# Patient Record
Sex: Male | Born: 1960 | Race: White | Hispanic: No | Marital: Married | Smoking: Former smoker
Health system: Southern US, Community
[De-identification: ages and names within clinical notes are randomized; demographics above are authoritative.]

## PROBLEM LIST (undated history)

## (undated) DIAGNOSIS — R945 Abnormal results of liver function studies: Secondary | ICD-10-CM

## (undated) DIAGNOSIS — G5600 Carpal tunnel syndrome, unspecified upper limb: Secondary | ICD-10-CM

## (undated) DIAGNOSIS — E785 Hyperlipidemia, unspecified: Secondary | ICD-10-CM

## (undated) DIAGNOSIS — I480 Paroxysmal atrial fibrillation: Principal | ICD-10-CM

## (undated) DIAGNOSIS — I10 Essential (primary) hypertension: Secondary | ICD-10-CM

## (undated) DIAGNOSIS — N529 Male erectile dysfunction, unspecified: Secondary | ICD-10-CM

## (undated) DIAGNOSIS — Z9289 Personal history of other medical treatment: Secondary | ICD-10-CM

## (undated) DIAGNOSIS — E669 Obesity, unspecified: Secondary | ICD-10-CM

## (undated) DIAGNOSIS — N400 Enlarged prostate without lower urinary tract symptoms: Secondary | ICD-10-CM

## (undated) DIAGNOSIS — E1142 Type 2 diabetes mellitus with diabetic polyneuropathy: Principal | ICD-10-CM

## (undated) DIAGNOSIS — G4710 Hypersomnia, unspecified: Secondary | ICD-10-CM

## (undated) HISTORY — PX: VASECTOMY: SHX75

## (undated) HISTORY — DX: Hypersomnia, unspecified: G47.10

## (undated) HISTORY — DX: Carpal tunnel syndrome, unspecified upper limb: G56.00

## (undated) HISTORY — DX: Obesity, unspecified: E66.9

## (undated) HISTORY — DX: Paroxysmal atrial fibrillation: I48.0

## (undated) HISTORY — DX: Abnormal results of liver function studies: R94.5

## (undated) HISTORY — DX: Benign prostatic hyperplasia without lower urinary tract symptoms: N40.0

## (undated) HISTORY — DX: Hyperlipidemia, unspecified: E78.5

## (undated) HISTORY — DX: Male erectile dysfunction, unspecified: N52.9

## (undated) HISTORY — DX: Type 2 diabetes mellitus with diabetic polyneuropathy: E11.42

## (undated) HISTORY — PX: PROSTATE SURGERY: SHX751

## (undated) HISTORY — DX: Essential (primary) hypertension: I10

## (undated) HISTORY — DX: Personal history of other medical treatment: Z92.89

---

## 2005-01-31 ENCOUNTER — Ambulatory Visit: Payer: Self-pay | Admitting: Cardiology

## 2005-05-31 ENCOUNTER — Ambulatory Visit: Payer: Self-pay | Admitting: Internal Medicine

## 2005-06-21 ENCOUNTER — Encounter: Admission: RE | Admit: 2005-06-21 | Discharge: 2005-06-21 | Payer: Self-pay | Admitting: General Surgery

## 2005-06-23 ENCOUNTER — Ambulatory Visit: Payer: Self-pay | Admitting: Internal Medicine

## 2005-07-21 ENCOUNTER — Ambulatory Visit: Payer: Self-pay | Admitting: Internal Medicine

## 2005-08-11 ENCOUNTER — Ambulatory Visit: Payer: Self-pay | Admitting: Internal Medicine

## 2005-08-30 ENCOUNTER — Ambulatory Visit: Payer: Self-pay | Admitting: Internal Medicine

## 2005-10-16 ENCOUNTER — Encounter: Admission: RE | Admit: 2005-10-16 | Discharge: 2005-11-07 | Payer: Self-pay | Admitting: Internal Medicine

## 2005-11-07 ENCOUNTER — Ambulatory Visit: Payer: Self-pay | Admitting: Internal Medicine

## 2006-02-12 ENCOUNTER — Encounter: Admission: RE | Admit: 2006-02-12 | Discharge: 2006-02-12 | Payer: Self-pay | Admitting: Internal Medicine

## 2006-03-07 ENCOUNTER — Ambulatory Visit: Payer: Self-pay | Admitting: Internal Medicine

## 2006-04-06 ENCOUNTER — Ambulatory Visit: Payer: Self-pay | Admitting: Internal Medicine

## 2006-06-06 ENCOUNTER — Ambulatory Visit: Payer: Self-pay | Admitting: Internal Medicine

## 2006-06-06 LAB — CONVERTED CEMR LAB
ALT: 45 units/L — ABNORMAL HIGH (ref 0–40)
AST: 25 units/L (ref 0–37)
BUN: 14 mg/dL (ref 6–23)
CO2: 28 meq/L (ref 19–32)
Calcium: 9.2 mg/dL (ref 8.4–10.5)
Chloride: 101 meq/L (ref 96–112)
Creatinine, Ser: 1.2 mg/dL (ref 0.4–1.5)
Creatinine,U: 225.5 mg/dL
GFR calc non Af Amer: 69 mL/min
Glomerular Filtration Rate, Af Am: 83 mL/min/{1.73_m2}
Glucose, Bld: 143 mg/dL — ABNORMAL HIGH (ref 70–99)
Hgb A1c MFr Bld: 7 % — ABNORMAL HIGH (ref 4.6–6.0)
Microalb Creat Ratio: 1.8 mg/g (ref 0.0–30.0)
Microalb, Ur: 0.4 mg/dL (ref 0.0–1.9)
Potassium: 3.7 meq/L (ref 3.5–5.1)
Sodium: 138 meq/L (ref 135–145)

## 2006-08-09 ENCOUNTER — Ambulatory Visit: Payer: Self-pay | Admitting: Internal Medicine

## 2007-02-11 ENCOUNTER — Encounter (INDEPENDENT_AMBULATORY_CARE_PROVIDER_SITE_OTHER): Payer: Self-pay | Admitting: *Deleted

## 2007-03-12 ENCOUNTER — Ambulatory Visit: Payer: Self-pay | Admitting: Internal Medicine

## 2007-03-12 DIAGNOSIS — R945 Abnormal results of liver function studies: Secondary | ICD-10-CM

## 2007-03-12 DIAGNOSIS — N529 Male erectile dysfunction, unspecified: Secondary | ICD-10-CM

## 2007-03-12 DIAGNOSIS — N400 Enlarged prostate without lower urinary tract symptoms: Secondary | ICD-10-CM | POA: Insufficient documentation

## 2007-03-12 DIAGNOSIS — J309 Allergic rhinitis, unspecified: Secondary | ICD-10-CM | POA: Insufficient documentation

## 2007-03-12 DIAGNOSIS — E119 Type 2 diabetes mellitus without complications: Secondary | ICD-10-CM | POA: Insufficient documentation

## 2007-03-12 DIAGNOSIS — E118 Type 2 diabetes mellitus with unspecified complications: Secondary | ICD-10-CM | POA: Insufficient documentation

## 2007-03-15 LAB — CONVERTED CEMR LAB
ALT: 46 units/L (ref 0–53)
AST: 23 units/L (ref 0–37)
BUN: 16 mg/dL (ref 6–23)
Basophils Absolute: 0 10*3/uL (ref 0.0–0.1)
Basophils Relative: 0.1 % (ref 0.0–1.0)
CO2: 30 meq/L (ref 19–32)
Calcium: 8.6 mg/dL (ref 8.4–10.5)
Chloride: 108 meq/L (ref 96–112)
Creatinine, Ser: 1 mg/dL (ref 0.4–1.5)
Creatinine,U: 109.6 mg/dL
Eosinophils Absolute: 0.3 10*3/uL (ref 0.0–0.6)
Eosinophils Relative: 4.7 % (ref 0.0–5.0)
GFR calc Af Amer: 103 mL/min
GFR calc non Af Amer: 85 mL/min
Glucose, Bld: 156 mg/dL — ABNORMAL HIGH (ref 70–99)
HCT: 43.6 % (ref 39.0–52.0)
Hemoglobin: 15.3 g/dL (ref 13.0–17.0)
Hgb A1c MFr Bld: 6.9 % — ABNORMAL HIGH (ref 4.6–6.0)
Lymphocytes Relative: 29.1 % (ref 12.0–46.0)
MCHC: 35 g/dL (ref 30.0–36.0)
MCV: 82.7 fL (ref 78.0–100.0)
Microalb Creat Ratio: 2.7 mg/g (ref 0.0–30.0)
Microalb, Ur: 0.3 mg/dL (ref 0.0–1.9)
Monocytes Absolute: 0.6 10*3/uL (ref 0.2–0.7)
Monocytes Relative: 10 % (ref 3.0–11.0)
Neutro Abs: 3.4 10*3/uL (ref 1.4–7.7)
Neutrophils Relative %: 56.1 % (ref 43.0–77.0)
Platelets: 266 10*3/uL (ref 150–400)
Potassium: 4.5 meq/L (ref 3.5–5.1)
RBC: 5.27 M/uL (ref 4.22–5.81)
RDW: 13.1 % (ref 11.5–14.6)
Sodium: 143 meq/L (ref 135–145)
WBC: 6 10*3/uL (ref 4.5–10.5)

## 2007-05-16 ENCOUNTER — Telehealth (INDEPENDENT_AMBULATORY_CARE_PROVIDER_SITE_OTHER): Payer: Self-pay | Admitting: *Deleted

## 2007-05-17 ENCOUNTER — Ambulatory Visit: Payer: Self-pay | Admitting: Internal Medicine

## 2007-07-15 ENCOUNTER — Encounter (INDEPENDENT_AMBULATORY_CARE_PROVIDER_SITE_OTHER): Payer: Self-pay | Admitting: *Deleted

## 2007-07-22 ENCOUNTER — Encounter (INDEPENDENT_AMBULATORY_CARE_PROVIDER_SITE_OTHER): Payer: Self-pay | Admitting: *Deleted

## 2007-08-06 ENCOUNTER — Ambulatory Visit: Payer: Self-pay | Admitting: Internal Medicine

## 2007-08-09 ENCOUNTER — Encounter (INDEPENDENT_AMBULATORY_CARE_PROVIDER_SITE_OTHER): Payer: Self-pay | Admitting: *Deleted

## 2007-08-09 LAB — CONVERTED CEMR LAB
Cholesterol: 157 mg/dL (ref 0–200)
Creatinine,U: 162.3 mg/dL
HDL: 30 mg/dL — ABNORMAL LOW (ref >39.0)
Hgb A1c MFr Bld: 7.6 % — ABNORMAL HIGH (ref 4.6–6.0)
LDL Cholesterol: 109 mg/dL — ABNORMAL HIGH (ref 0–99)
Microalb Creat Ratio: 10.5 mg/g (ref 0.0–30.0)
Microalb, Ur: 1.7 mg/dL (ref 0.0–1.9)
TSH: 0.97 microintl units/mL (ref 0.35–5.50)
Total CHOL/HDL Ratio: 5.2
Triglycerides: 91 mg/dL (ref 0–149)
VLDL: 18 mg/dL (ref 0–40)

## 2007-09-05 ENCOUNTER — Ambulatory Visit: Payer: Self-pay | Admitting: Internal Medicine

## 2007-12-11 ENCOUNTER — Ambulatory Visit: Payer: Self-pay | Admitting: Internal Medicine

## 2007-12-11 DIAGNOSIS — G4710 Hypersomnia, unspecified: Secondary | ICD-10-CM | POA: Insufficient documentation

## 2007-12-11 HISTORY — DX: Hypersomnia, unspecified: G47.10

## 2007-12-11 LAB — CONVERTED CEMR LAB: Glucose, Bld: 273 mg/dL

## 2007-12-16 LAB — CONVERTED CEMR LAB
BUN: 13 mg/dL (ref 6–23)
CO2: 29 meq/L (ref 19–32)
Chloride: 101 meq/L (ref 96–112)
Creatinine, Ser: 1.2 mg/dL (ref 0.4–1.5)
Hgb A1c MFr Bld: 8.7 % — ABNORMAL HIGH (ref 4.6–6.0)
Potassium: 4.6 meq/L (ref 3.5–5.1)

## 2007-12-25 ENCOUNTER — Telehealth: Payer: Self-pay | Admitting: Internal Medicine

## 2008-02-25 ENCOUNTER — Ambulatory Visit: Payer: Self-pay | Admitting: Internal Medicine

## 2008-02-28 ENCOUNTER — Encounter (INDEPENDENT_AMBULATORY_CARE_PROVIDER_SITE_OTHER): Payer: Self-pay | Admitting: *Deleted

## 2008-02-28 LAB — CONVERTED CEMR LAB
Alkaline Phosphatase: 84 units/L (ref 39–117)
Bilirubin, Direct: 0.1 mg/dL (ref 0.0–0.3)
Creatinine, Ser: 1 mg/dL (ref 0.4–1.5)
Total Bilirubin: 0.6 mg/dL (ref 0.3–1.2)
Total Protein: 7.2 g/dL (ref 6.0–8.3)

## 2008-03-18 ENCOUNTER — Ambulatory Visit: Payer: Self-pay | Admitting: Internal Medicine

## 2008-03-18 LAB — CONVERTED CEMR LAB
Bilirubin Urine: NEGATIVE
Blood in Urine, dipstick: NEGATIVE
Ketones, urine, test strip: NEGATIVE
Nitrite: NEGATIVE
RBC / HPF: NONE SEEN (ref <3)
pH: 8

## 2008-03-19 ENCOUNTER — Encounter: Payer: Self-pay | Admitting: Internal Medicine

## 2008-03-23 ENCOUNTER — Ambulatory Visit: Payer: Self-pay | Admitting: Cardiovascular Disease

## 2008-03-25 ENCOUNTER — Telehealth (INDEPENDENT_AMBULATORY_CARE_PROVIDER_SITE_OTHER): Payer: Self-pay | Admitting: *Deleted

## 2008-04-26 HISTORY — PX: CYSTOSCOPY: SUR368

## 2008-05-11 ENCOUNTER — Ambulatory Visit: Payer: Self-pay | Admitting: Family Medicine

## 2008-05-11 LAB — CONVERTED CEMR LAB
Blood in Urine, dipstick: NEGATIVE
Glucose, Urine, Semiquant: >=1000
Nitrite: NEGATIVE
Specific Gravity, Urine: 1.015
WBC Urine, dipstick: NEGATIVE
WBC, UA: NONE SEEN cells/hpf (ref <3)
pH: 6

## 2008-05-12 ENCOUNTER — Encounter: Payer: Self-pay | Admitting: Family Medicine

## 2008-05-13 ENCOUNTER — Encounter: Payer: Self-pay | Admitting: Internal Medicine

## 2008-05-14 ENCOUNTER — Encounter (INDEPENDENT_AMBULATORY_CARE_PROVIDER_SITE_OTHER): Payer: Self-pay | Admitting: *Deleted

## 2008-06-23 ENCOUNTER — Encounter (INDEPENDENT_AMBULATORY_CARE_PROVIDER_SITE_OTHER): Payer: Self-pay | Admitting: *Deleted

## 2008-06-26 DIAGNOSIS — R7989 Other specified abnormal findings of blood chemistry: Secondary | ICD-10-CM

## 2008-06-26 HISTORY — DX: Other specified abnormal findings of blood chemistry: R79.89

## 2008-07-22 ENCOUNTER — Ambulatory Visit: Payer: Self-pay | Admitting: Internal Medicine

## 2008-07-27 ENCOUNTER — Telehealth (INDEPENDENT_AMBULATORY_CARE_PROVIDER_SITE_OTHER): Payer: Self-pay | Admitting: *Deleted

## 2008-07-27 LAB — CONVERTED CEMR LAB
ALT: 63 units/L — ABNORMAL HIGH (ref 0–53)
AST: 31 units/L (ref 0–37)
Hgb A1c MFr Bld: 7.3 % — ABNORMAL HIGH (ref 4.6–6.0)
Total Protein: 7.1 g/dL (ref 6.0–8.3)

## 2008-10-22 ENCOUNTER — Telehealth (INDEPENDENT_AMBULATORY_CARE_PROVIDER_SITE_OTHER): Payer: Self-pay | Admitting: *Deleted

## 2008-11-04 ENCOUNTER — Encounter (INDEPENDENT_AMBULATORY_CARE_PROVIDER_SITE_OTHER): Payer: Self-pay | Admitting: *Deleted

## 2008-11-04 ENCOUNTER — Ambulatory Visit: Payer: Self-pay | Admitting: Internal Medicine

## 2008-11-04 LAB — CONVERTED CEMR LAB
Blood in Urine, dipstick: NEGATIVE
Glucose, Urine, Semiquant: NEGATIVE
Nitrite: NEGATIVE
Specific Gravity, Urine: <1.005
WBC Urine, dipstick: NEGATIVE
pH: 6

## 2008-11-10 LAB — CONVERTED CEMR LAB
BUN: 17 mg/dL (ref 6–23)
Basophils Absolute: 0 10*3/uL (ref 0.0–0.1)
CO2: 30 meq/L (ref 19–32)
Creatinine,U: 34 mg/dL
Eosinophils Absolute: 0.2 10*3/uL (ref 0.0–0.7)
Glucose, Bld: 117 mg/dL — ABNORMAL HIGH (ref 70–99)
HCT: 46.5 % (ref 39.0–52.0)
HDL: 28.1 mg/dL — ABNORMAL LOW (ref >39.00)
Hemoglobin: 16 g/dL (ref 13.0–17.0)
Lymphs Abs: 1.4 10*3/uL (ref 0.7–4.0)
MCHC: 34.4 g/dL (ref 30.0–36.0)
Microalb Creat Ratio: 5.9 mg/g (ref 0.0–30.0)
Monocytes Absolute: 0.5 10*3/uL (ref 0.1–1.0)
Neutro Abs: 3.5 10*3/uL (ref 1.4–7.7)
PSA: 0.49 ng/mL (ref 0.10–4.00)
Platelets: 229 10*3/uL (ref 150.0–400.0)
Potassium: 4.3 meq/L (ref 3.5–5.1)
RDW: 12.5 % (ref 11.5–14.6)
Sodium: 140 meq/L (ref 135–145)
Total CHOL/HDL Ratio: 5
Triglycerides: 70 mg/dL (ref 0.0–149.0)
VLDL: 14 mg/dL (ref 0.0–40.0)

## 2009-01-19 ENCOUNTER — Ambulatory Visit: Payer: Self-pay | Admitting: Internal Medicine

## 2009-01-19 ENCOUNTER — Encounter (INDEPENDENT_AMBULATORY_CARE_PROVIDER_SITE_OTHER): Payer: Self-pay | Admitting: *Deleted

## 2009-01-19 LAB — CONVERTED CEMR LAB
Glucose, Urine, Semiquant: 500
Nitrite: NEGATIVE
Specific Gravity, Urine: 1.01
WBC Urine, dipstick: NEGATIVE
pH: 5

## 2009-01-20 ENCOUNTER — Encounter: Payer: Self-pay | Admitting: Internal Medicine

## 2009-01-27 ENCOUNTER — Encounter: Payer: Self-pay | Admitting: Internal Medicine

## 2009-03-08 ENCOUNTER — Encounter (INDEPENDENT_AMBULATORY_CARE_PROVIDER_SITE_OTHER): Payer: Self-pay | Admitting: *Deleted

## 2009-03-16 ENCOUNTER — Encounter (INDEPENDENT_AMBULATORY_CARE_PROVIDER_SITE_OTHER): Payer: Self-pay | Admitting: *Deleted

## 2009-03-16 ENCOUNTER — Ambulatory Visit: Payer: Self-pay | Admitting: Internal Medicine

## 2009-03-22 ENCOUNTER — Ambulatory Visit: Payer: Self-pay | Admitting: Internal Medicine

## 2009-03-24 ENCOUNTER — Telehealth (INDEPENDENT_AMBULATORY_CARE_PROVIDER_SITE_OTHER): Payer: Self-pay | Admitting: *Deleted

## 2009-03-24 LAB — CONVERTED CEMR LAB
Cholesterol: 158 mg/dL (ref 0–200)
LDL Cholesterol: 112 mg/dL — ABNORMAL HIGH (ref 0–99)
Total CHOL/HDL Ratio: 6
Triglycerides: 97 mg/dL (ref 0.0–149.0)
VLDL: 19.4 mg/dL (ref 0.0–40.0)

## 2009-04-03 LAB — CONVERTED CEMR LAB
Hep B E Ab: NEGATIVE
Hep B S Ab: POSITIVE — AB

## 2009-04-30 ENCOUNTER — Encounter: Payer: Self-pay | Admitting: Internal Medicine

## 2009-05-25 ENCOUNTER — Ambulatory Visit: Payer: Self-pay | Admitting: Internal Medicine

## 2009-05-26 ENCOUNTER — Encounter (INDEPENDENT_AMBULATORY_CARE_PROVIDER_SITE_OTHER): Payer: Self-pay | Admitting: *Deleted

## 2009-05-26 LAB — CONVERTED CEMR LAB
AST: 38 units/L — ABNORMAL HIGH (ref 0–37)
Cholesterol: 117 mg/dL (ref 0–200)
HDL: 27.4 mg/dL — ABNORMAL LOW (ref >39.00)
LDL Cholesterol: 72 mg/dL (ref 0–99)
Triglycerides: 88 mg/dL (ref 0.0–149.0)
VLDL: 17.6 mg/dL (ref 0.0–40.0)

## 2009-06-17 ENCOUNTER — Encounter (INDEPENDENT_AMBULATORY_CARE_PROVIDER_SITE_OTHER): Payer: Self-pay | Admitting: *Deleted

## 2009-06-17 ENCOUNTER — Ambulatory Visit: Payer: Self-pay | Admitting: Endocrinology

## 2009-06-17 DIAGNOSIS — G5600 Carpal tunnel syndrome, unspecified upper limb: Secondary | ICD-10-CM

## 2009-06-17 HISTORY — DX: Carpal tunnel syndrome, unspecified upper limb: G56.00

## 2009-07-16 ENCOUNTER — Ambulatory Visit: Payer: Self-pay | Admitting: Internal Medicine

## 2009-07-16 ENCOUNTER — Encounter (INDEPENDENT_AMBULATORY_CARE_PROVIDER_SITE_OTHER): Payer: Self-pay | Admitting: *Deleted

## 2009-07-21 LAB — CONVERTED CEMR LAB
ALT: 53 units/L (ref 0–53)
AST: 25 units/L (ref 0–37)
BUN: 17 mg/dL (ref 6–23)
Bilirubin, Direct: <0.1 mg/dL (ref 0.0–0.3)
Ceruloplasmin: 27 mg/dL (ref 21–63)
Chloride: 104 meq/L (ref 96–112)
Potassium: 4.1 meq/L (ref 3.5–5.3)
Saturation Ratios: 19 % — ABNORMAL LOW (ref 20–55)
Sodium: 134 meq/L — ABNORMAL LOW (ref 135–145)
TIBC: 332 ug/dL (ref 215–435)
Total Bilirubin: 0.3 mg/dL (ref 0.3–1.2)
UIBC: 269 ug/dL

## 2009-07-23 ENCOUNTER — Telehealth: Payer: Self-pay | Admitting: Endocrinology

## 2009-08-11 ENCOUNTER — Encounter (INDEPENDENT_AMBULATORY_CARE_PROVIDER_SITE_OTHER): Payer: Self-pay | Admitting: *Deleted

## 2009-08-11 ENCOUNTER — Ambulatory Visit: Payer: Self-pay | Admitting: Internal Medicine

## 2009-08-11 DIAGNOSIS — R350 Frequency of micturition: Secondary | ICD-10-CM

## 2009-08-11 LAB — CONVERTED CEMR LAB
Bacteria, UA: NONE SEEN
Bilirubin Urine: NEGATIVE
Blood Glucose, Fingerstick: 229
Casts: NONE SEEN /lpf
Crystals: NONE SEEN
Glucose, Urine, Semiquant: >=1000
Ketones, urine, test strip: NEGATIVE
Nitrite: NEGATIVE
Protein, U semiquant: NEGATIVE
Specific Gravity, Urine: 1.015
Urobilinogen, UA: 0.2
WBC Urine, dipstick: NEGATIVE
pH: 5

## 2009-08-12 ENCOUNTER — Telehealth: Payer: Self-pay | Admitting: Endocrinology

## 2009-08-12 ENCOUNTER — Encounter: Payer: Self-pay | Admitting: Internal Medicine

## 2009-08-12 LAB — CONVERTED CEMR LAB: PSA: 0.55 ng/mL (ref 0.10–4.00)

## 2009-08-13 ENCOUNTER — Ambulatory Visit: Payer: Self-pay | Admitting: Endocrinology

## 2009-08-27 ENCOUNTER — Ambulatory Visit: Payer: Self-pay | Admitting: Endocrinology

## 2009-10-11 ENCOUNTER — Encounter: Payer: Self-pay | Admitting: Internal Medicine

## 2009-11-05 ENCOUNTER — Ambulatory Visit: Payer: Self-pay | Admitting: Internal Medicine

## 2009-11-05 DIAGNOSIS — R109 Unspecified abdominal pain: Secondary | ICD-10-CM | POA: Insufficient documentation

## 2009-11-05 LAB — CONVERTED CEMR LAB
Blood in Urine, dipstick: NEGATIVE
Ketones, urine, test strip: NEGATIVE
Nitrite: NEGATIVE
Urobilinogen, UA: 0.2
WBC Urine, dipstick: NEGATIVE

## 2009-11-09 ENCOUNTER — Encounter: Payer: Self-pay | Admitting: Internal Medicine

## 2009-11-09 LAB — CONVERTED CEMR LAB
RBC / HPF: NONE SEEN (ref <3)
WBC, UA: NONE SEEN cells/hpf (ref <3)

## 2009-12-10 ENCOUNTER — Encounter: Payer: Self-pay | Admitting: Endocrinology

## 2009-12-11 ENCOUNTER — Encounter: Payer: Self-pay | Admitting: Internal Medicine

## 2009-12-11 ENCOUNTER — Encounter: Payer: Self-pay | Admitting: Endocrinology

## 2009-12-12 ENCOUNTER — Encounter: Payer: Self-pay | Admitting: Internal Medicine

## 2009-12-12 LAB — CONVERTED CEMR LAB
Cholesterol: 113 mg/dL
LDL Cholesterol: 33.8 mg/dL
T3 Uptake Ratio: 34 %
T4, Total: 7.6 ug/dL
VLDL: 53.4 mg/dL

## 2009-12-13 ENCOUNTER — Ambulatory Visit: Payer: Self-pay | Admitting: Endocrinology

## 2009-12-14 ENCOUNTER — Ambulatory Visit: Payer: Self-pay | Admitting: Internal Medicine

## 2009-12-14 ENCOUNTER — Encounter (INDEPENDENT_AMBULATORY_CARE_PROVIDER_SITE_OTHER): Payer: Self-pay | Admitting: *Deleted

## 2009-12-14 DIAGNOSIS — E785 Hyperlipidemia, unspecified: Secondary | ICD-10-CM

## 2009-12-16 ENCOUNTER — Encounter: Payer: Self-pay | Admitting: Internal Medicine

## 2009-12-21 ENCOUNTER — Ambulatory Visit: Payer: Self-pay | Admitting: Cardiovascular Disease

## 2009-12-21 ENCOUNTER — Encounter: Payer: Self-pay | Admitting: Internal Medicine

## 2009-12-21 DIAGNOSIS — I480 Paroxysmal atrial fibrillation: Secondary | ICD-10-CM | POA: Insufficient documentation

## 2009-12-21 HISTORY — DX: Paroxysmal atrial fibrillation: I48.0

## 2009-12-22 ENCOUNTER — Telehealth: Payer: Self-pay | Admitting: Cardiovascular Disease

## 2010-01-03 ENCOUNTER — Telehealth: Payer: Self-pay | Admitting: Cardiovascular Disease

## 2010-01-04 ENCOUNTER — Ambulatory Visit: Payer: Self-pay | Admitting: Cardiovascular Disease

## 2010-01-13 ENCOUNTER — Ambulatory Visit: Payer: Self-pay | Admitting: Endocrinology

## 2010-01-17 ENCOUNTER — Telehealth: Payer: Self-pay | Admitting: Cardiovascular Disease

## 2010-01-24 DIAGNOSIS — Z9289 Personal history of other medical treatment: Secondary | ICD-10-CM

## 2010-01-24 HISTORY — DX: Personal history of other medical treatment: Z92.89

## 2010-02-01 ENCOUNTER — Ambulatory Visit: Payer: Self-pay | Admitting: Cardiovascular Disease

## 2010-02-03 ENCOUNTER — Encounter: Payer: Self-pay | Admitting: Cardiology

## 2010-02-03 ENCOUNTER — Telehealth (INDEPENDENT_AMBULATORY_CARE_PROVIDER_SITE_OTHER): Payer: Self-pay | Admitting: *Deleted

## 2010-02-07 ENCOUNTER — Encounter (INDEPENDENT_AMBULATORY_CARE_PROVIDER_SITE_OTHER): Payer: Self-pay | Admitting: *Deleted

## 2010-02-07 ENCOUNTER — Encounter (HOSPITAL_COMMUNITY): Admission: RE | Admit: 2010-02-07 | Discharge: 2010-03-15 | Payer: Self-pay | Admitting: Cardiovascular Disease

## 2010-02-07 ENCOUNTER — Ambulatory Visit: Payer: Self-pay

## 2010-02-07 ENCOUNTER — Ambulatory Visit: Payer: Self-pay | Admitting: Cardiology

## 2010-02-21 ENCOUNTER — Ambulatory Visit: Payer: Self-pay | Admitting: Endocrinology

## 2010-02-22 ENCOUNTER — Telehealth: Payer: Self-pay | Admitting: Cardiovascular Disease

## 2010-03-25 ENCOUNTER — Ambulatory Visit: Payer: Self-pay | Admitting: Endocrinology

## 2010-03-25 LAB — CONVERTED CEMR LAB
Creatinine,U: 220.1 mg/dL
Hgb A1c MFr Bld: 7.5 % — ABNORMAL HIGH (ref 4.6–6.5)
Microalb, Ur: 0.3 mg/dL (ref 0.0–1.9)

## 2010-04-12 ENCOUNTER — Encounter: Payer: Self-pay | Admitting: Internal Medicine

## 2010-04-12 ENCOUNTER — Encounter (INDEPENDENT_AMBULATORY_CARE_PROVIDER_SITE_OTHER): Payer: Self-pay | Admitting: *Deleted

## 2010-04-12 ENCOUNTER — Ambulatory Visit: Payer: Self-pay | Admitting: Internal Medicine

## 2010-04-12 DIAGNOSIS — G4700 Insomnia, unspecified: Secondary | ICD-10-CM

## 2010-04-12 LAB — CONVERTED CEMR LAB
ALT: 37 units/L (ref 0–53)
AST: 24 units/L (ref 0–37)
Basophils Absolute: 0.1 10*3/uL (ref 0.0–0.1)
Calcium: 9.2 mg/dL (ref 8.4–10.5)
Chloride: 105 meq/L (ref 96–112)
Creatinine, Ser: 1.1 mg/dL (ref 0.4–1.5)
Eosinophils Absolute: 0.3 10*3/uL (ref 0.0–0.7)
Hemoglobin: 16 g/dL (ref 13.0–17.0)
Lymphocytes Relative: 37.2 % (ref 12.0–46.0)
MCHC: 33.6 g/dL (ref 30.0–36.0)
Neutro Abs: 3.2 10*3/uL (ref 1.4–7.7)
Platelets: 213 10*3/uL (ref 150.0–400.0)
RDW: 13.5 % (ref 11.5–14.6)

## 2010-04-14 ENCOUNTER — Telehealth: Payer: Self-pay | Admitting: Internal Medicine

## 2010-04-19 ENCOUNTER — Ambulatory Visit: Payer: Self-pay | Admitting: Cardiovascular Disease

## 2010-04-22 ENCOUNTER — Ambulatory Visit: Payer: Self-pay | Admitting: Endocrinology

## 2010-07-07 ENCOUNTER — Ambulatory Visit
Admission: RE | Admit: 2010-07-07 | Discharge: 2010-07-07 | Payer: Self-pay | Source: Home / Self Care | Attending: Endocrinology | Admitting: Endocrinology

## 2010-07-07 ENCOUNTER — Other Ambulatory Visit: Payer: Self-pay | Admitting: Endocrinology

## 2010-07-07 LAB — HEMOGLOBIN A1C: Hgb A1c MFr Bld: 7.2 % — ABNORMAL HIGH (ref 4.6–6.5)

## 2010-07-24 LAB — CONVERTED CEMR LAB
Sed Rate: 10 mm/hr (ref 0–20)
Total CK: 147 units/L (ref 7–195)

## 2010-07-26 NOTE — Letter (Signed)
Summary: Out of Work  [facility]  [address] [place], [place] [address]   Phone: [phone]  Fax: [phone]    August 27, 2009   Employee:  Danny [name]    To Whom It May Concern:   For Medical reasons, please excuse the above named employee from work for the following dates:  Afternoon of 08/27/09   Sincerely,    [name] MD

## 2010-07-26 NOTE — Assessment & Plan Note (Signed)
Summary: PER PT 1 MTH FU--STC   Vital Signs:  Patient profile:   50 year old male Height:      71 inches (180.34 cm) Weight:      232.50 pounds (105.68 kg) BMI:     32.54 O2 Sat:      96 % on Room air Temp:     97.4 degrees F (36.33 degrees C) oral Pulse rate:   53 / minute BP sitting:   112 / 64  (left arm) Cuff size:   large  Vitals Entered By: Brenton Grills CMA Duncan Dull) (April 22, 2010 4:17 PM)  O2 Flow:  Room air CC: 1 month F/U/aj Is Patient Diabetic? Yes   Referring Provider:  paz  CC:  1 month F/U/aj.  History of Present Illness: pt says he is still having mild hypoglycemia, with exercise.  he says this is due to the fact that he often cannot anticipate the exercise in order to reduce previos dose of humalog.  no cbg record, but states cbg's are usually highest at hs (high-100's), and lowest before lunch.  Current Medications (verified): 1)  Hydrochlorothiazide 25 Mg Tabs (Hydrochlorothiazide) .... 1/2 Once Daily 2)  Flonase 50 Mcg/act Susp (Fluticasone Propionate) .... As Needed 3)  Onetouch Ultra Test  Strp (Glucose Blood) .... One Time Daily 4)  Flomax 0.4 Mg Xr24h-Cap (Tamsulosin Hcl) .Marland Kitchen.. 1 By Mouth Once Daily 5)  Simvastatin 20 Mg Tabs (Simvastatin) .... Take One Tablet At Bedtime 6)  One Touch Delica Lancets  Misc (Lancets) .... Checks Blood Sugar Daily, Dx 250.00 7)  Fish Oil 1000 Mg Caps (Omega-3 Fatty Acids) .... 4000mg  Two Times A Day 8)  Pradaxa 150 Mg Caps (Dabigatran Etexilate Mesylate) .Marland Kitchen.. 1 Two Times A Day 9)  Lantus Solostar 100 Unit/ml Soln (Insulin Glargine) .... 20 Units At Bedtime 10)  Humalog Kwikpen 100 Unit/ml Soln (Insulin Lispro (Human)) .... 20 Units Three Times A Day (Just Before Each Meal) 11)  Bd Pen Needle Short U/f 31g X 8 Mm Misc (Insulin Pen Needle) .... 4x A Day 12)  Multaq 400 Mg Tabs (Dronedarone Hcl) .... Take One Tablet By Mouth Two Times A Day 13)  Ativan 0.5 Mg Tabs (Lorazepam) .... 1/2 To 1 By Mouth At Bedtime As Needed  Insomnia  Allergies (verified): 1)  ! Metformin Hcl (Metformin Hcl)  Past History:  Past Medical History: Last updated: 04/12/2010 Atrial Fibrilation, paroxysmal---dx 11-2009 8-11----neg stres test Allergic rhinitis Diabetes mellitus, type II, dx 1999 Hypertension Benign prostatic hypertrophy, s/p TUNA procedure aprox 2006 neg cystoscopy @ urology 11-09, was Rx Flomax  ED after TUNA elevated LFTs: (-) chronic hep  panel 2010  Review of Systems  The patient denies syncope.    Physical Exam  General:  normal appearance.   Neck:  Supple without thyroid enlargement or tenderness.    Impression & Recommendations:  Problem # 1:  DIABETES MELLITUS, TYPE II, UNCONTROLLED (ICD-250.02) needs increased rx  Medications Added to Medication List This Visit: 1)  Lantus Solostar 100 Unit/ml Soln (Insulin glargine) .Marland Kitchen.. 15 units at bedtime 2)  Humalog Kwikpen 100 Unit/ml Soln (Insulin lispro (human)) .... Three times a day (just before each meal), 20-20-25 units  Other Orders: Est. Patient Level III ([address])  Patient Instructions: 1)  reduce lantus to 15 units at bedtime 2)  increase humalog to (just before each meal) 20-20-25 units 3)   if exercise is anticipated, take only 10 units of humalog.  however, if you are not able to anticipate the  exercise, eat a light snack with the exercise.   4)  Please schedule a follow-up appointment in 3 months. Prescriptions: HUMALOG KWIKPEN 100 UNIT/ML SOLN (INSULIN LISPRO (HUMAN)) three times a day (just before each meal), 20-20-25 units  #1 box x 11   Entered and Authorized by:   [name] MD   Signed by:   [name] MD on 04/22/2010   Method used:   Print then Give to Patient   RxID:   [id number]    Orders Added: 1)  Est. Patient Level III [[address]782]

## 2010-07-26 NOTE — Assessment & Plan Note (Signed)
Summary: 2 WK FU---STC   Vital Signs:  Patient profile:   50 year old male Height:      71 inches (180.34 cm) Weight:      224.13 pounds (101.88 kg) O2 Sat:      97 % on Room air Temp:     97.1 degrees F (36.17 degrees C) oral Pulse rate:   61 / minute BP sitting:   120 / 70  (left arm) Cuff size:   large  Vitals Entered By: Josph Macho RMA (August 27, 2009 4:03 PM)  O2 Flow:  Room air CC: 2 week follow up/ CF Is Patient Diabetic? Yes   Referring Provider:  paz  CC:  2 week follow up/ CF.  History of Present Illness: edema is much better. he brings a record of his cbg's which i have reviewed today.  it varies from 120-300, with most in high-100's.  there is no trend throughout the day.  he is not taking the parlodel.  Current Medications (verified): 1)  Januvia 100 Mg Tabs (Sitagliptin Phosphate) .... Take One Tab By Mouth Daily 2)  Hydrochlorothiazide 25 Mg Tabs (Hydrochlorothiazide) .Marland Kitchen.. 1 By Mouth Qd 3)  Flonase 50 Mcg/act Susp (Fluticasone Propionate) .... As Needed 4)  Aspirin 81 Mg  Tbec (Aspirin) .... Once Daily 5)  Onetouch Ultra Test  Strp (Glucose Blood) .... One Time Daily 6)  Flomax 0.4 Mg Xr24h-Cap (Tamsulosin Hcl) .Marland Kitchen.. 1 By Mouth Once Daily 7)  Simvastatin 20 Mg Tabs (Simvastatin) .... Take One Tablet At Bedtime 8)  One Touch Delica Lancets  Misc (Lancets) .... Checks Blood Sugar Daily, Dx 250.00 9)  One Touch Test Strips .... Checks Blood Sugar Daily, Dx 250.00 10)  Ciprofloxacin Hcl 500 Mg Tabs (Ciprofloxacin Hcl) .... One  By Mouth Twice A Day 11)  Glimepiride 4 Mg Tabs (Glimepiride) .Marland Kitchen.. 1 Bid 12)  Bromocriptine Mesylate 2.5 Mg Tabs (Bromocriptine Mesylate) .... 1/2 Tab Qhs  Allergies (verified): 1)  ! Metformin Hcl (Metformin Hcl)  Past History:  Past Medical History: Last updated: 07/16/2009 Allergic rhinitis Diabetes mellitus, type II, diagnosis in 1999 Hypertension Benign prostatic hypertrophy, s/p TUNA procedure aprox 2006 neg cystoscopy  @ urology 11-09, was Rx Flomax  ED after TUNA elevated LFTs: (-) chronic hep  panel 2010  Review of Systems  The patient denies hypoglycemia.    Physical Exam  General:  normal appearance.   Extremities:  trace right pedal edema and trace left pedal edema.   Additional Exam:   Hemoglobin A1C       [H]  8.8 %    Impression & Recommendations:  Problem # 1:  DIABETES MELLITUS, TYPE II, UNCONTROLLED (ICD-250.02) needs increased rx  Other Orders: TLB-A1C / Hgb A1C (Glycohemoglobin) (83036-A1C) Est. Patient Level III ([address])  Patient Instructions: 1)  i have resent the bromocriptine to [facility] on [address].  while this does not cause the sugar to be low, it can expose the tendency for the glimepiride to put it low.  therefore, if it goes low, reduce the glimepiride by 1/2. 2)  tests are being ordered for you today.  a few days after the test(s), please call [phone] to hear your test results. 3)  Please schedule a follow-up appointment in 3 months. Prescriptions: BROMOCRIPTINE MESYLATE 2.5 MG TABS (BROMOCRIPTINE MESYLATE) 1/2 tab qhs  #30 x 5   Entered and Authorized by:   [name] MD   Signed by:   [name] MD on 08/27/2009  Method used:   Electronically to        [facility] (retail)       [address]       [place], [place]  [address]       Ph: [phone] or [phone]       Fax: [phone]   RxID:   [id number]

## 2010-07-26 NOTE — Progress Notes (Signed)
  Phone Note Call from Patient   Caller: girlfriend Summary of Call: T.C. from patient's girl friend-patient went back into a.fib last night-taken to ED and was given Cardizem which slowed down rate.  Still in a. fib.  ED MD advised patient to stop Lopressor and start on Cardizem CD 180mg .  Patient to leave today around 1pm for beach.  Wants to know if should change medications and if can go to beach as planned.  Will contact Dr. Freida Busman and let patient know.   Initial call taken by: Dessie Coma,  December 22, 2009 9:01 AM

## 2010-07-26 NOTE — Assessment & Plan Note (Signed)
Summary: PT BLOOD SUGAR ELEVATED, NEED TO BE SEEN SOONER PER PAZ AND E...   Vital Signs:  Patient profile:   50 year old male Height:      71 inches (180.34 cm) Weight:      231.25 pounds (105.11 kg) O2 Sat:      97 % on Room air Temp:     97.5 degrees F (36.39 degrees C) oral Pulse rate:   73 / minute BP sitting:   122 / 64  (left arm) Cuff size:   large  Vitals Entered By: Josph Macho RMA (August 13, 2009 2:41 PM)  O2 Flow:  Room air CC: BS elevated/ 245 this morning/ CF Is Patient Diabetic? Yes   Referring Provider:  paz  CC:  BS elevated/ 245 this morning/ CF.  History of Present Illness: pt says his cbg's have increased to the 200's.  he is not sure why, except for a recent prostate infection.   pt states few weeks of moderate edema of the legs, but no associated sob.  Current Medications (verified): 1)  Januvia 100 Mg Tabs (Sitagliptin Phosphate) .... Take One Tab By Mouth Daily 2)  Hydrochlorothiazide 25 Mg Tabs (Hydrochlorothiazide) .Marland Kitchen.. 1 By Mouth Qd 3)  Flonase 50 Mcg/act Susp (Fluticasone Propionate) .... As Needed 4)  Aspirin 81 Mg  Tbec (Aspirin) .... Once Daily 5)  Onetouch Ultra Test  Strp (Glucose Blood) .... One Time Daily 6)  Flomax 0.4 Mg Xr24h-Cap (Tamsulosin Hcl) .Marland Kitchen.. 1 By Mouth Once Daily 7)  Simvastatin 20 Mg Tabs (Simvastatin) .... Take One Tablet At Bedtime 8)  Actos 45 Mg Tabs (Pioglitazone Hcl) .Marland Kitchen.. 1 Qd 9)  Glimepiride 1 Mg Tabs (Glimepiride) .Marland Kitchen.. 1 Qam 10)  One Touch Delica Lancets  Misc (Lancets) .... Checks Blood Sugar Daily, Dx 250.00 11)  One Touch Test Strips .... Checks Blood Sugar Daily, Dx 250.00 12)  Ciprofloxacin Hcl 500 Mg Tabs (Ciprofloxacin Hcl) .... One  By Mouth Twice A Day  Allergies (verified): 1)  ! Metformin Hcl (Metformin Hcl)  Past History:  Past Medical History: Last updated: 07/16/2009 Allergic rhinitis Diabetes mellitus, type II, diagnosis in 1999 Hypertension Benign prostatic hypertrophy, s/p TUNA  procedure aprox 2006 neg cystoscopy @ urology 11-09, was Rx Flomax  ED after TUNA elevated LFTs: (-) chronic hep  panel 2010  Review of Systems       The patient complains of weight gain.  The patient denies fever.         he reports fatigue.  Physical Exam  General:  obese.  no distress  Extremities:  1+ right pedal edema and 1+ left pedal edema.     Impression & Recommendations:  Problem # 1:  DIABETES MELLITUS, TYPE II, UNCONTROLLED (ICD-250.02) Assessment Deteriorated  Problem # 2:  edema prob due to actos  Problem # 3:  proststitis this might increase cbg's  Medications Added to Medication List This Visit: 1)  Glimepiride 4 Mg Tabs (Glimepiride) .Marland Kitchen.. 1 bid 2)  Bromocriptine Mesylate 2.5 Mg Tabs (Bromocriptine mesylate) .... 1/2 tab qhs  Other Orders: Est. Patient Level IV ([address])  Patient Instructions: 1)  redouble dietary efforts. 2)  stop actos. 3)  increase glimepiride to 4 mg two times a day. 4)  same [place] 5)  add brompcriptine 1/2 of 2.5 mg at bedtime. 6)  Please schedule a follow-up appointment in 2 weeks. 7)  check your blood sugar 2 times a day.  vary the time of day when you check, between before the  3 meals, and at bedtime.  also check if you have symptoms of your blood sugar being too high or too low.  please keep a record of the readings and bring it to your next appointment here.  please call us sooner if you are having low blood sugar episodes. Prescriptions: BROMOCRIPTINE MESYLATE 2.5 MG TABS (BROMOCRIPTINE MESYLATE) 1/2 tab qhs  #30 x 5   Entered and Authorized by:   [name] MD   Signed by:   [name] MD on 08/13/2009   Method used:   Electronically to        [facility] (retail)       [address]       [place], [place]  [address]       Ph: [phone] or [phone]       Fax: [phone]   RxID:   [id number] GLIMEPIRIDE 4 MG TABS (GLIMEPIRIDE) 1 bid  #60 x 11   Entered and  Authorized by:   [name] MD   Signed by:   [name] MD on 08/13/2009   Method used:   Electronically to        [facility] [address] [address] (retail)       [address]       [place], [place]  [address]       Ph: [phone] or [phone]       Fax: [phone]   RxID:   [id number]

## 2010-07-26 NOTE — Progress Notes (Signed)
  Phone Note From Other Clinic   Caller: [facility] Summary of Call: Reported patient is in rapid atrial fibrillation.  Cannot reach patient. Initial call taken by: Dessie Coma,  January 03, 2010 4:41 PM  Follow-up for Phone Call        Patient called:  called home # and spoke with wife.  States she has talked with him several times today and he has been OK.  Given cell # to call patient.  Patient states he feels fine.  No symptoms of chest pain,SOB, or sweating episodes.  States 2 leads have come unhooked because of sweating.  Has f/u appt with Dr. Kirke Corin tomorrow. Follow-up by: Dessie Coma,  January 03, 2010 4:43 PM

## 2010-07-26 NOTE — Assessment & Plan Note (Signed)
Summary: PROSTATE ENLARGE/GOING TO BATHROOM AT NIGHT/KDC   Vital Signs:  Patient profile:   50 year old male Height:      71 inches Weight:      224.6 pounds Temp:     97.5 degrees F Pulse rate:   74 / minute BP sitting:   140 / 80  Vitals Entered By: Shary Decamp (August 11, 2009 12:50 PM) CC: acute only Is Patient Diabetic? Yes CBG Result 229 Comments  - FBS this am was 188, states avg FBS has been >180  - urinary frequency x 2 1/2 weeks  - blurred vision  - low abd pain (worse when bending over)  - pt states he is constipated (had small BM this am)  - finger tips get cold, white - they hurt when fingers get cold .Shary Decamp  August 11, 2009 1:02 PM    History of Present Illness: diabetes,  CBGs has been increase in the last two weeks, no apparent reason  he has chronic prostate symptoms, symptoms are above  baseline for the last few weeks increased frequency, increase difficult urinating denies fevers, gross hematuria, nausea or vomiting he is taking Flomax  injured his left fifth toe a few days ago, likes me to  look at it.  He hit  his toe in the kitchen table.  Current Medications (verified): 1)  Januvia 100 Mg Tabs (Sitagliptin Phosphate) .... Take One Tab By Mouth Daily 2)  Hydrochlorothiazide 25 Mg Tabs (Hydrochlorothiazide) .Marland Kitchen.. 1 By Mouth Qd 3)  Flonase 50 Mcg/act Susp (Fluticasone Propionate) .... As Needed 4)  Aspirin 81 Mg  Tbec (Aspirin) .... Once Daily 5)  Onetouch Ultra Test  Strp (Glucose Blood) .... One Time Daily 6)  Flomax 0.4 Mg Xr24h-Cap (Tamsulosin Hcl) .Marland Kitchen.. 1 By Mouth Once Daily 7)  Simvastatin 20 Mg Tabs (Simvastatin) .... Take One Tablet At Bedtime 8)  Actos 45 Mg Tabs (Pioglitazone Hcl) .Marland Kitchen.. 1 Qd 9)  Glimepiride 1 Mg Tabs (Glimepiride) .Marland Kitchen.. 1 Qam 10)  One Touch Delica Lancets  Misc (Lancets) .... Checks Blood Sugar Daily, Dx 250.00 11)  One Touch Test Strips .... Checks Blood Sugar Daily, Dx 250.00  Allergies (verified): No Known  Drug Allergies  Past History:  Past Medical History: Reviewed history from 07/16/2009 and no changes required. Allergic rhinitis Diabetes mellitus, type II, diagnosis in 1999 Hypertension Benign prostatic hypertrophy, s/p TUNA procedure aprox 2006 neg cystoscopy @ urology 11-09, was Rx Flomax  ED after TUNA elevated LFTs: (-) chronic hep  panel 2010  Past Surgical History: Reviewed history from 11/04/2008 and no changes required. Vasectomy TUNA  Review of Systems      See HPI  Physical Exam  General:  alert and well-developed.   Abdomen:  soft, no distention, and no masses.  slightly tender on the lower abdomen, left side.  This finding is a chronic one Rectal:  No external abnormalities noted. Normal sphincter tone. No rectal masses or tenderness. Prostate:  prostate is not enlarged, not nodular.  No actual tenderness Extremities:  5th left total slightly red, not swollen, not tender to palpation, no warm   Impression & Recommendations:  Problem # 1:  BENIGN PROSTATIC HYPERTROPHY (ICD-600.00)  patient has chronic urinary symptoms, status-post a TUNA  few years ago chart reviewed was evaluated by urology, 11-09, cysto neg, was Rx flomax   prostatitis? ( symptoms increase from baseline, microhematuria) plan: PSA, urine culture, Cipro (avoid sun exposure)  Orders: Venipuncture ([address]) TLB-PSA (Prostate Specific Antigen) (84153-PSA)  Problem #  2:  DIABETES MELLITUS, TYPE II, UNCONTROLLED (ICD-250.02) now under the care of Dr. Everardo All apparently not well controlled if #1 is indeed  prostatitis, CBGs  may be increased due to the infection will try to get him to see Dr. Everardo All sooner than  planned (flag sent)  His updated medication list for this problem includes:    Januvia 100 Mg Tabs (Sitagliptin phosphate) .Marland Kitchen... Take one tab by mouth daily    Aspirin 81 Mg Tbec (Aspirin) ..... Once daily    Actos 45 Mg Tabs (Pioglitazone hcl) .Marland Kitchen... 1 qd    Glimepiride 1 Mg Tabs  (Glimepiride) .Marland Kitchen... 1 qam  Orders: Fingerstick ([address])  Problem # 3:  toe injury: Observation  Complete Medication List: 1)  Januvia 100 Mg Tabs (Sitagliptin phosphate) .... Take one tab by mouth daily 2)  Hydrochlorothiazide 25 Mg Tabs (Hydrochlorothiazide) .Marland Kitchen.. 1 by mouth qd 3)  Flonase 50 Mcg/act Susp (Fluticasone propionate) .... As needed 4)  Aspirin 81 Mg Tbec (Aspirin) .... Once daily 5)  Onetouch Ultra Test Strp (Glucose blood) .... One time daily 6)  Flomax 0.4 Mg Xr24h-cap (Tamsulosin hcl) .Marland Kitchen.. 1 by mouth once daily 7)  Simvastatin 20 Mg Tabs (Simvastatin) .... Take one tablet at bedtime 8)  Actos 45 Mg Tabs (Pioglitazone hcl) .Marland Kitchen.. 1 qd 9)  Glimepiride 1 Mg Tabs (Glimepiride) .Marland Kitchen.. 1 qam 10)  One Touch Delica Lancets Misc (Lancets) .... Checks blood sugar daily, dx 250.00 11)  One Touch Test Strips  .... Checks blood sugar daily, dx 250.00 12)  Ciprofloxacin Hcl 500 Mg Tabs (Ciprofloxacin hcl) .... One  by mouth twice a day  Other Orders: Capillary Blood Glucose/CBG ([address]) UA Dipstick w/o Micro (manual) ([address]) T-Culture, Urine ([id number]) T-Urine Microscopic ([id number])  Patient Instructions: 1)  start Cipro 2)  call if not better in one week Prescriptions: CIPROFLOXACIN HCL 500 MG TABS (CIPROFLOXACIN HCL) one  by mouth twice a day  #28 x 0   Entered and Authorized by:   Nolon Rod. Maxi Rodas MD   Signed by:   Nolon Rod. Macky Galik MD on 08/11/2009   Method used:   Electronically to        [facility] (retail)       [address]       [place], [place]  [address]       Ph: [phone] or [phone]       Fax: [phone]   RxID:   [id number]   Laboratory Results   Urine Tests    Routine Urinalysis   Glucose: >=1000   (Normal Range: Negative) Bilirubin: negative   (Normal Range: Negative) Ketone: negative   (Normal Range: Negative) Spec. Gravity: 1.015   (Normal Range: 1.003-1.035) Blood: moderate   (Normal Range: Negative) pH:  5.0   (Normal Range: 5.0-8.0) Protein: negative   (Normal Range: Negative) Urobilinogen: 0.2   (Normal Range: 0-1) Nitrite: negative   (Normal Range: Negative) Leukocyte Esterace: negative   (Normal Range: Negative)     Blood Tests     CBG Random:: 229mg /dL

## 2010-07-26 NOTE — Progress Notes (Signed)
  Phone Note Outgoing Call   Call placed by: Dessie Coma  LPN,  February 22, 2010 2:51 PM Call placed to: Patient Summary of Call: Patient notified per Dr.Arida, stress test was normal.

## 2010-07-26 NOTE — Assessment & Plan Note (Signed)
Summary: 3 MO ROV /NWS   Vital Signs:  Patient profile:   50 year old male Weight:      223 pounds BMI:     31.21 Temp:     97.9 degrees F Pulse rate:   56 / minute Pulse rhythm:   regular BP sitting:   120 / 72  (left arm) Cuff size:   large  Vitals Entered By: Lamar Sprinkles, CMA (December 13, 2009 1:04 PM) CC: F/u - post hosptial - D/c sunday from [facility] for irregular hearrate Comments Added fish oil, pradaxa, lopressor and lantus. Changed hctz, glimepiride and Stopped asprin per Hospital d/c.    Referring Provider:  paz  CC:  F/u - post hosptial - D/c sunday from [facility] for irregular hearrate.  History of Present Illness: pt was recently hospitalized at [facility] for atrial fibrillation.  he is feeling better.  he has started insulin.  no cbg record, but states cbg's are mid-100's in am.    Allergies: 1)  ! Metformin Hcl (Metformin Hcl)  Physical Exam  General:  obese.  no distress  Pulses:  dorsalis pedis intact bilat.   Extremities:  no edema.  no deformity.  no ulcer on the feet.  feet are of normal color and temp.    Neurologic:  cn 2-12 grossly intact.   readily moves all 4's.   sensation is intact to touch on the feet, but slightly decreased from normal on the hands Additional Exam:  a1c ([facility])=9.5   Impression & Recommendations:  Problem # 1:  DIABETES MELLITUS, TYPE II, UNCONTROLLED (ICD-250.02) needs increased rx  Medications Added to Medication List This Visit: 1)  Hydrochlorothiazide 25 Mg Tabs (Hydrochlorothiazide) .... 1/2 once daily 2)  Glimepiride 4 Mg Tabs (Glimepiride) .... 1/2 two times a day 3)  Fish Oil 1000 Mg Caps (Omega-3 fatty acids) .... 4000mg  two times a day 4)  Pradaxa 150 Mg Caps (Dabigatran etexilate mesylate) .Marland Kitchen.. 1 two times a day 5)  Metoprolol Tartrate 25 Mg Tabs (Metoprolol tartrate) .... 1/2 every am 6)  Lantus Solostar 100 Unit/ml Soln (Insulin glargine) .... 20 units at bedtime  Other  Orders: Est. Patient Level III ([address])  Patient Instructions: 1)  one at a time, stop [place], bromocriptine, and glimepiride. 2)  with each discontinuation, increase lantus until blood sugar at some time of day is in the low-100's.   3)  Please schedule a follow-up appointment in 1 month. 4)  check your blood sugar 2 times a day.  vary the time of day when you check, between before the 3 meals, and at bedtime.  also check if you have symptoms of your blood sugar being too high or too low.  please keep a record of the readings and bring it to your next appointment here.  please call us sooner if you are having low blood sugar episodes.

## 2010-07-26 NOTE — Progress Notes (Signed)
----   Converted from flag ---- ---- 08/12/2009 7:09 AM, [name] MD wrote: [name] f/u needed < 1 week, thank you.  ---- 08/11/2009 8:41 PM, Jose E. Paz MD wrote: Danny [name] his sugar are higher , would you see him sooner than planed? ------------------------------  Phone Note Other Incoming   Summary of Call: Left a message for pt to call office. Initial call taken by: Josph Macho RMA,  August 12, 2009 8:43 AM  Follow-up for Phone Call        Pt is coming in today at 3:15. Follow-up by: Josph Macho RMA,  August 13, 2009 10:50 AM

## 2010-07-26 NOTE — Assessment & Plan Note (Signed)
Summary: prostate infection//uti??//lch   Vital Signs:  Patient profile:   50 year old male Height:      71 inches Weight:      222.6 pounds Temp:     97.1 degrees F Pulse rate:   64 / minute BP sitting:   120 / 70  Vitals Entered By: Shary Decamp (Nov 05, 2009 3:50 PM) CC: urinary freq, low back, abd pain x 2 weeks Is Patient Diabetic? Yes   History of Present Illness: long history of lower abdominal discomfort, symptoms are worse for last 2 weeks. The discomfort is bilateral, decrease after he urinates. His urinary symptoms are worse than baseline----> increased frequency, stream is slow and "stop and go"  review of systems No fever No nausea, vomiting, diarrhea Bowel movements are normal Appetite normal  Allergies: 1)  ! Metformin Hcl (Metformin Hcl)  Past History:  Past Medical History: Allergic rhinitis Diabetes mellitus, type II, dx 1999 Hypertension Benign prostatic hypertrophy, s/p TUNA procedure aprox 2006 neg cystoscopy @ urology 11-09, was Rx Flomax  ED after TUNA elevated LFTs: (-) chronic hep  panel 2010  Past Surgical History: Reviewed history from 11/04/2008 and no changes required. Vasectomy TUNA  Review of Systems      See HPI  Physical Exam  General:  alert and well-developed.   Abdomen:  soft, normal bowel sounds, no distention, no masses, no guarding, no rigidity, and no rebound tenderness.  very mild tenderness to deep palpation on the left lower abdomen. Otherwise no tenderness Prostate:  Prostate gland firm and smooth, no enlargement, nodularity, tenderness, mass, asymmetry or induration. Extremities:  no edema   Impression & Recommendations:  Problem # 1:  ABDOMINAL PAIN, LOWER (ICD-789.09) chart reviewed  similar symptoms before, previously CT - UCx were neg 9--09 was evaluated by urology, 11-09, cysto neg, was Rx flomax   today, physical exam is benign, urinalysis is negative and he is taking Flomax Plan: call if if symptoms  worsen or if he has fever needs to see urology historically, this pain is better when he is sugars are lower. Needs  to work on his diabetes. antibiotics if culture positive  Complete Medication List: 1)  Januvia 100 Mg Tabs (Sitagliptin phosphate) .... Take one tab by mouth daily 2)  Hydrochlorothiazide 25 Mg Tabs (Hydrochlorothiazide) .Marland Kitchen.. 1 by mouth qd 3)  Flonase 50 Mcg/act Susp (Fluticasone propionate) .... As needed 4)  Aspirin 81 Mg Tbec (Aspirin) .... Once daily 5)  Onetouch Ultra Test Strp (Glucose blood) .... One time daily 6)  Flomax 0.4 Mg Xr24h-cap (Tamsulosin hcl) .Marland Kitchen.. 1 by mouth once daily 7)  Simvastatin 20 Mg Tabs (Simvastatin) .... Take one tablet at bedtime 8)  One Touch Delica Lancets Misc (Lancets) .... Checks blood sugar daily, dx 250.00 9)  One Touch Test Strips  .... Checks blood sugar daily, dx 250.00 10)  Glimepiride 4 Mg Tabs (Glimepiride) .Marland Kitchen.. 1 bid 11)  Bromocriptine Mesylate 2.5 Mg Tabs (Bromocriptine mesylate) .... 1/2 tab qhs  Other Orders: Specimen Handling ([address]) T-Urine Microscopic [id number]) T-Culture, Urine ([id number]) UA Dipstick w/o Micro (manual) (81002)  Laboratory Results   Urine Tests    Routine Urinalysis   Glucose: >=1000   (Normal Range: Negative) Bilirubin: negative   (Normal Range: Negative) Ketone: negative   (Normal Range: Negative) Spec. Gravity: 1.015   (Normal Range: 1.003-1.035) Blood: negative   (Normal Range: Negative) pH: 5.0   (Normal Range: 5.0-8.0) Protein: negative   (Normal Range: Negative) Urobilinogen: 0.2   (Normal Range:  0-1) Nitrite: negative   (Normal Range: Negative) Leukocyte Esterace: negative   (Normal Range: Negative)

## 2010-07-26 NOTE — Letter (Signed)
Summary: Generic Letter  [name]  [address] [place], [place] [address]   Phone: [phone]  Fax: [phone]    08/13/2009  Danny [name] [address] [place], [place]  [address]  Dear [name],  This is to certify that you were seen as a patient on our office this afternoon.   Sincerely,   Romero Belling MD

## 2010-07-26 NOTE — Letter (Signed)
Summary: Outpatient Coinsurance Notice  Outpatient Coinsurance Notice   Imported By: Marylou Mccoy 02/18/2010 11:58:46  _____________________________________________________________________  External Attachment:    Type:   Image     Comment:   External Document

## 2010-07-26 NOTE — Assessment & Plan Note (Signed)
Summary: 4 MTH FU/NS/KDC   Vital Signs:  Patient profile:   50 year old male Height:      71 inches Weight:      222.4 pounds BMI:     31.13 Pulse rate:   74 / minute BP sitting:   110 / 70  Vitals Entered By: Shary Decamp (July 16, 2009 3:22 PM) CC: ROV -  Comments  - has not been checking blood sugars @ home because meter is broken, new meter given to pt Shary Decamp  July 16, 2009 3:26 PM    History of Present Illness: DM-- saw Everardo All , changes were made  has not been checking blood sugars @ home because meter is broken, new meter given to patient  HTN-ambulatory BPs excellent  increased LFTs-- hepatitis panel neg, he did have a Hep B shots BPH-- symptoms unchanged but stable on flomax   Current Medications (verified): 1)  Januvia 100 Mg Tabs (Sitagliptin Phosphate) .... Take One Tab By Mouth Daily 2)  Hydrochlorothiazide 25 Mg Tabs (Hydrochlorothiazide) .Marland Kitchen.. 1 By Mouth Qd 3)  Flonase 50 Mcg/act Susp (Fluticasone Propionate) .... As Needed 4)  Aspirin 81 Mg  Tbec (Aspirin) .... Once Daily 5)  Onetouch Ultra Test  Strp (Glucose Blood) .... One Time Daily 6)  Flomax 0.4 Mg Xr24h-Cap (Tamsulosin Hcl) .Marland Kitchen.. 1 By Mouth Once Daily 7)  Simvastatin 20 Mg Tabs (Simvastatin) .... Take One Tablet At Bedtime 8)  Actos 45 Mg Tabs (Pioglitazone Hcl) .Marland Kitchen.. 1 Qd 9)  Glimepiride 1 Mg Tabs (Glimepiride) .Marland Kitchen.. 1 Qam 10)  One Touch Delica Lancets  Misc (Lancets) .... Checks Blood Sugar Daily, Dx 250.00 11)  One Touch Test Strips .... Checks Blood Sugar Daily, Dx 250.00  Allergies (verified): No Known Drug Allergies  Past History:  Past Medical History: Allergic rhinitis Diabetes mellitus, type II, diagnosis in 1999 Hypertension Benign prostatic hypertrophy, s/p TUNA procedure aprox 2006 neg cystoscopy @ urology 11-09, was Rx Flomax  ED after TUNA elevated LFTs: (-) chronic hep  panel 2010  Past Surgical History: Reviewed history from 11/04/2008 and no changes  required. Vasectomy TUNA  Review of Systems CV:  Denies chest pain or discomfort, shortness of breath with exertion, and swelling of feet. GI:  Denies diarrhea, nausea, and vomiting. Endo:  no low sugar symptoms  .  Physical Exam  General:  alert, well-developed, and well-nourished.   Lungs:  normal respiratory effort, no intercostal retractions, no accessory muscle use, and normal breath sounds.   Heart:  normal rate, regular rhythm, and no murmur.   Extremities:  no edema   Impression & Recommendations:  Problem # 1:  DIABETES MELLITUS, TYPE II, UNCONTROLLED (ICD-250.02) per Dr. Everardo All patient reports he had his eyes check few months ago  His updated medication list for this problem includes:    Januvia 100 Mg Tabs (Sitagliptin phosphate) .Marland Kitchen... Take one tab by mouth daily    Aspirin 81 Mg Tbec (Aspirin) ..... Once daily    Actos 45 Mg Tabs (Pioglitazone hcl) .Marland Kitchen... 1 qd    Glimepiride 1 Mg Tabs (Glimepiride) .Marland Kitchen... 1 qam  Problem # 2:  HYPERTENSION (ICD-401.9) excellent control His updated medication list for this problem includes:    Hydrochlorothiazide 25 Mg Tabs (Hydrochlorothiazide) .Marland Kitchen... 1 by mouth qd  BP today: 110/70 Prior BP: 122/70 (06/17/2009)  Labs Reviewed: K+: 4.3 (11/04/2008) Creat: : 0.9 (11/04/2008)   Chol: 117 (05/25/2009)   HDL: 27.40 (05/25/2009)   LDL: 72 (05/25/2009)   TG: 88.0 (05/25/2009)  Problem # 3:  OTHER NONSPECIFIC ABNORMAL SERUM ENZYME LEVELS (ICD-790.5) neg  chronic hepatitis panel, he did have previous HEP B  immunization  labs (iron, ceruloplasmin)  noting that he is now off metformin and on Actos  Orders: Venipuncture (73710) Misc. Referral (Misc. Ref)  Complete Medication List: 1)  Januvia 100 Mg Tabs (Sitagliptin phosphate) .... Take one tab by mouth daily 2)  Hydrochlorothiazide 25 Mg Tabs (Hydrochlorothiazide) .Marland Kitchen.. 1 by mouth qd 3)  Flonase 50 Mcg/act Susp (Fluticasone propionate) .... As needed 4)  Aspirin 81 Mg Tbec  (Aspirin) .... Once daily 5)  Onetouch Ultra Test Strp (Glucose blood) .... One time daily 6)  Flomax 0.4 Mg Xr24h-cap (Tamsulosin hcl) .Marland Kitchen.. 1 by mouth once daily 7)  Simvastatin 20 Mg Tabs (Simvastatin) .... Take one tablet at bedtime 8)  Actos 45 Mg Tabs (Pioglitazone hcl) .Marland Kitchen.. 1 qd 9)  Glimepiride 1 Mg Tabs (Glimepiride) .Marland Kitchen.. 1 qam 10)  One Touch Delica Lancets Misc (Lancets) .... Checks blood sugar daily, dx 250.00 11)  One Touch Test Strips  .... Checks blood sugar daily, dx 250.00  Patient Instructions: 1)  Please schedule a follow-up appointment in 4 to 5 months .  Prescriptions: ONE TOUCH TEST STRIPS checks blood sugar daily, dx 250.00  #100 x 3   Entered by:   Shary Decamp   Authorized by:   Nolon Rod. Nowell Sites MD   Signed by:   Shary Decamp on 07/16/2009   Method used:   Faxed to ...       [facility] (retail)       [address]       [place], [place]  [address]       Ph: [phone] or [phone]       Fax: [phone]   RxID:   [id number] ONE TOUCH DELICA LANCETS  MISC (LANCETS) checks blood sugar daily, dx 250.00  #100 x 3   Entered by:   Shary Decamp   Authorized by:   Nolon Rod. Paiton Boultinghouse MD   Signed by:   Shary Decamp on 07/16/2009   Method used:   [facility]        [facility]       [address]       [place], [place]  [address]       Ph: [phone] or [phone]       Fax: [phone]   RxID:   [id number] HYDROCHLOROTHIAZIDE 25 MG TABS (HYDROCHLOROTHIAZIDE) 1 by mouth qd  #100 x 3   Entered by:   Shary Decamp   Authorized by:   Nolon Rod. Mylinh Cragg MD   Signed by:   Shary Decamp on 07/16/2009   Method used:   Electronically to        [facility] ([facility]ail[facility]       [address]       [place], [place]  [address]       Ph: [phone] or [phone]       Fax: [phone]   RxID:   [id number]    Influenza Immunization History:    Influenza #  1:  got it at work  (07/16/2009)

## 2010-07-26 NOTE — Letter (Signed)
Summary: Out of Work  [facility]  [address] [place], [place] [address]   Phone: [phone]  Fax: [phone]    December 14, 2009   Employee:  Danny [name]    To Whom It May Concern:   For Medical reasons, please excuse the above named employee from work for the following dates:  Start:   December 14, 2009  End:   December 15, 2009  If you need additional information, please feel free to contact our office.         Sincerely,    Harold Barban

## 2010-07-26 NOTE — Letter (Signed)
Summary: [facility]  [facility]   Imported By: Lanelle Bal 12/29/2009 15:07:06  _____________________________________________________________________  External Attachment:    Type:   Image     Comment:   External Document

## 2010-07-26 NOTE — Miscellaneous (Signed)
  Clinical Lists Changes  Observations: Added new observation of VLDL: 53.4 mg/dL (57/84/6962 9:52) Added new observation of LDL: 33.8 mg/dL (84/13/2440 1:02) Added new observation of HDL: 25.8 mg/dL (72/53/6644 0:34) Added new observation of TRIGLYC TOT: 267 mg/dL (74/25/9563 8:75) Added new observation of CHOLESTEROL: 113 mg/dL (64/33/2951 8:84) Added new observation of HGBA1C: 9.5 % (12/12/2009 9:08) Added new observation of TSH: 0.868 microintl units/mL (12/12/2009 9:08) Added new observation of T4, FREE: 2.6 ng/dL (16/60/6301 6:01) Added new observation of T3 UPTAKE S: 34 % (12/12/2009 9:08) Added new observation of T4, TOTAL: 7.6 mcg/dL (09/32/3557 3:22)

## 2010-07-26 NOTE — Assessment & Plan Note (Signed)
Summary: 1 MTH FU---STC   Vital Signs:  Patient profile:   50 year old male Height:      71 inches (180.34 cm) Weight:      226.31 pounds (102.87 kg) BMI:     31.68 O2 Sat:      96 % on Room air Temp:     98.4 degrees F (36.89 degrees C) oral Pulse rate:   53 / minute BP sitting:   108 / 62  (left arm) Cuff size:   large  Vitals Entered By: Brenton Grills MA (March 25, 2010 4:12 PM)  O2 Flow:  Room air CC: 1 month F/U/aj Is Patient Diabetic? Yes   Referring Provider:  paz  CC:  1 month F/U/aj.  History of Present Illness: pt states he has had to increase his humalog to 20 units three times a day (just before each meal), in order to keep his cbg at 100.  pt states he feels well in general.  he has mild hypoglycemia with exercise.  he says there is no trend throughout the day.  he says cbg is slightly higher in am than at hs.    Current Medications (verified): 1)  Hydrochlorothiazide 25 Mg Tabs (Hydrochlorothiazide) .... 1/2 Once Daily 2)  Flonase 50 Mcg/act Susp (Fluticasone Propionate) .... As Needed 3)  Onetouch Ultra Test  Strp (Glucose Blood) .... One Time Daily 4)  Flomax 0.4 Mg Xr24h-Cap (Tamsulosin Hcl) .Marland Kitchen.. 1 By Mouth Once Daily 5)  Simvastatin 20 Mg Tabs (Simvastatin) .... Take One Tablet At Bedtime 6)  One Touch Delica Lancets  Misc (Lancets) .... Checks Blood Sugar Daily, Dx 250.00 7)  Fish Oil 1000 Mg Caps (Omega-3 Fatty Acids) .... 4000mg  Two Times A Day 8)  Pradaxa 150 Mg Caps (Dabigatran Etexilate Mesylate) .Marland Kitchen.. 1 Two Times A Day 9)  Lantus Solostar 100 Unit/ml Soln (Insulin Glargine) .... 20 Units At Bedtime 10)  Humalog Kwikpen 100 Unit/ml Soln (Insulin Lispro (Human)) .... Three Times A Day (Just Before Each Meal) 15-5-20 Units 11)  Bd Pen Needle Short U/f 31g X 8 Mm Misc (Insulin Pen Needle) .... 4x A Day 12)  Multaq 400 Mg Tabs (Dronedarone Hcl) .... Take One Tablet By Mouth Two Times A Day  Allergies (verified): 1)  ! Metformin Hcl (Metformin  Hcl)  Past History:  Past Medical History: Last updated: 11/05/2009 Allergic rhinitis Diabetes mellitus, type II, dx 1999 Hypertension Benign prostatic hypertrophy, s/p TUNA procedure aprox 2006 neg cystoscopy @ urology 11-09, was Rx Flomax  ED after TUNA elevated LFTs: (-) chronic hep  panel 2010  Review of Systems  The patient denies syncope.    Physical Exam  General:  normal appearance.   Pulses:  dorsalis pedis intact bilat.   Extremities:  no edema.  no deformity.  no ulcer on the feet.  feet are of normal color and temp.    Neurologic:  sensation is intact to touch on the feet. Additional Exam:  Hemoglobin A1C       [H]  7.5 %          Impression & Recommendations:  Problem # 1:  DIABETES MELLITUS, TYPE II, UNCONTROLLED (ICD-250.02) Assessment Improved  Medications Added to Medication List This Visit: 1)  Humalog Kwikpen 100 Unit/ml Soln (Insulin lispro (human)) .... 20 units three times a day (just before each meal)  Other Orders: TLB-A1C / Hgb A1C (Glycohemoglobin) (83036-A1C) TLB-Microalbumin/Creat Ratio, Urine (82043-MALB) Est. Patient Level III ([address])  Patient Instructions: 1)  blood tests are  being ordered for you today.  please call [phone] to hear your test results. 2)  pending the test results, please continue the same insulin for now, except if exercise is anticipated, take only 10 units of humalog. 3)  Please schedule a follow-up appointment in 3-4 months. 4)  (update: i left message on phone-tree:  rx as we discussed) Prescriptions: HUMALOG KWIKPEN 100 UNIT/ML SOLN (INSULIN LISPRO (HUMAN)) 20 units three times a day (just before each meal)  #1 box x 11   Entered and Authorized by:   [name] MD   Signed by:   [name] MD on 03/25/2010   Method used:   Print then Give to Patient   RxID:   [id number] HUMALOG KWIKPEN 100 UNIT/ML SOLN (INSULIN LISPRO (HUMAN)) 20 units three times a day (just before each meal)  #2 boxes x 11    Entered and Authorized by:   [name] MD   Signed by:   [name] MD on 03/25/2010   Method used:   Electronically to        [facility] [address] [address] (retail)       [address]       [place], [place]  [address]       Ph: [phone] or [phone]       Fax: [phone]   RxID:   [id number]

## 2010-07-26 NOTE — Assessment & Plan Note (Signed)
Summary: 4 MONTH FOLLOWUP//KN   Vital Signs:  Patient profile:   50 year old male Weight:      231.50 pounds Pulse rate:   78 / minute Pulse rhythm:   regular BP sitting:   130 / 84  (left arm) Cuff size:   large  Vitals Entered By: Army Fossa CMA (April 12, 2010 3:46 PM) CC: Pt here for 4 month f/u - not fasting  Comments [facility]   History of Present Illness: Routine office visit He had a negative stress test 01/2010, q. days later, he noticed that he was going "out of rhythm" on-and-off. He feels like his heart is going fast, there is no associated diaphoresis, fainting spells. He does feel slightly weak. During the time of this symptoms, his CBGs are  usually higher than usual.  diff sleeping (since he started pradaxa?)  ROS No chest pain or shortness of breath Good medication compliance No symptoms of hypoglycemia, last A1C improving, ambulatory CBGs betwen 110 and 140 most of the time  no anxiety depression   Current Medications (verified): 1)  Hydrochlorothiazide 25 Mg Tabs (Hydrochlorothiazide) .... 1/2 Once Daily 2)  Flonase 50 Mcg/act Susp (Fluticasone Propionate) .... As Needed 3)  Onetouch Ultra Test  Strp (Glucose Blood) .... One Time Daily 4)  Flomax 0.4 Mg Xr24h-Cap (Tamsulosin Hcl) .Marland Kitchen.. 1 By Mouth Once Daily 5)  Simvastatin 20 Mg Tabs (Simvastatin) .... Take One Tablet At Bedtime 6)  One Touch Delica Lancets  Misc (Lancets) .... Checks Blood Sugar Daily, Dx 250.00 7)  Fish Oil 1000 Mg Caps (Omega-3 Fatty Acids) .... 4000mg  Two Times A Day 8)  Pradaxa 150 Mg Caps (Dabigatran Etexilate Mesylate) .Marland Kitchen.. 1 Two Times A Day 9)  Lantus Solostar 100 Unit/ml Soln (Insulin Glargine) .... 20 Units At Bedtime 10)  Humalog Kwikpen 100 Unit/ml Soln (Insulin Lispro (Human)) .... 20 Units Three Times A Day (Just Before Each Meal) 11)  Bd Pen Needle Short U/f 31g X 8 Mm Misc (Insulin Pen Needle) .... 4x A Day 12)  Multaq 400 Mg Tabs (Dronedarone Hcl) .... Take One  Tablet By Mouth Two Times A Day  Allergies (verified): 1)  ! Metformin Hcl (Metformin Hcl)  Past History:  Past Medical History: Atrial Fibrilation, paroxysmal---dx 11-2009 8-11----neg stres test Allergic rhinitis Diabetes mellitus, type II, dx 1999 Hypertension Benign prostatic hypertrophy, s/p TUNA procedure aprox 2006 neg cystoscopy @ urology 11-09, was Rx Flomax  ED after TUNA elevated LFTs: (-) chronic hep  panel 2010  Past Surgical History: Reviewed history from 11/04/2008 and no changes required. Vasectomy TUNA  Social History: Reviewed history from 11/04/2008 and no changes required. separated from wife, has a girlfrined  two children lives in [place]--quit ETOH-- socially  Works for [employer] ([place])  diet-- much better, has lost some wt, feel great exercise -- very active   Physical Exam  General:  alert and well-developed.   Lungs:  normal respiratory effort, no intercostal retractions, no accessory muscle use, and normal breath sounds.   Heart:  normal rate, regular rhythm, and no murmur.   Extremities:  no leg edema Psych:  Oriented X3, memory intact for recent and remote, normally interactive, good eye contact, not anxious appearing, and not depressed appearing.     Impression & Recommendations:  Problem # 1:  ATRIAL FIBRILLATION (ICD-427.31) paroxysmal atrial fibrillation, more symptomatic lately without chest pain or fainting episodes. See history of present illness Plan: Labs EKG-- NSR , no change from previous  Will let  cardiology know about his symptoms and see what needs to be done will go to the ER if symptoms severe, near syncope, CP  ADDENDUM I spoke with Dr. Kirke Corin, we will fax my note and  EKG to him. He is planning to contact the patient and possibly adjust his medication. Terria Deschepper E. Jovanie Verge MD  April 14, 2010 1:03 PM  His updated medication list for this problem includes:    Multaq 400 Mg Tabs (Dronedarone hcl) .Marland Kitchen... Take one tablet by  mouth two times a day  Orders: Venipuncture ([address]) TLB-CBC Platelet - w/Differential (85025-CBCD) TLB-BMP (Basic Metabolic Panel-BMET) (80048-METABOL) TLB-TSH (Thyroid Stimulating Hormone) (84443-TSH) Specimen Handling ([address])  Problem # 2:  HYPERTENSION (ICD-401.9) at goal  His updated medication list for this problem includes:    Hydrochlorothiazide 25 Mg Tabs (Hydrochlorothiazide) .Marland Kitchen... 1/2 once daily  BP today: 130/84 Prior BP: 108/62 (03/25/2010)  Labs Reviewed: K+: 4.1 (07/16/2009) Creat: : 1.01 (07/16/2009)   Chol: 113 (12/12/2009)   HDL: 25.8 (12/12/2009)   LDL: 33.8 (12/12/2009)   TG: 267 (12/12/2009)  Problem # 3:  INSOMNIA-SLEEP DISORDER-UNSPEC (ICD-780.52) diff sleeping x  while, patient thinks related to heart meds UTD reviewed, insomnia not related to multaq or pradxa plan: ativan, low dose, as needed  hopefully this will be a short term problem  Complete Medication List: 1)  Hydrochlorothiazide 25 Mg Tabs (Hydrochlorothiazide) .... 1/2 once daily 2)  Flonase 50 Mcg/act Susp (Fluticasone propionate) .... As needed 3)  Onetouch Ultra Test Strp (Glucose blood) .... One time daily 4)  Flomax 0.4 Mg Xr24h-cap (Tamsulosin hcl) .Marland Kitchen.. 1 by mouth once daily 5)  Simvastatin 20 Mg Tabs (Simvastatin) .... Take one tablet at bedtime 6)  One Touch Delica Lancets Misc (Lancets) .... Checks blood sugar daily, dx 250.00 7)  Fish Oil 1000 Mg Caps (Omega-3 fatty acids) .... 4000mg  two times a day 8)  Pradaxa 150 Mg Caps (Dabigatran etexilate mesylate) .Marland Kitchen.. 1 two times a day 9)  Lantus Solostar 100 Unit/ml Soln (Insulin glargine) .... 20 units at bedtime 10)  Humalog Kwikpen 100 Unit/ml Soln (Insulin lispro (human)) .... 20 units three times a day (just before each meal) 11)  Bd Pen Needle Short U/f 31g X 8 Mm Misc (Insulin pen needle) .... 4x a day 12)  Multaq 400 Mg Tabs (Dronedarone hcl) .... Take one tablet by mouth two times a day 13)  Ativan 0.5 Mg Tabs (Lorazepam) .... 1/2  to 1 by mouth at bedtime as needed insomnia  Other Orders: TLB-ALT (SGPT) (84460-ALT) TLB-AST (SGOT) (84450-SGOT)  Patient Instructions: 1)  Please schedule a follow-up appointment in 4 months .  Prescriptions: ATIVAN 0.5 MG TABS (LORAZEPAM) 1/2 to 1 by mouth at bedtime as needed insomnia  #30 x 0   Entered and Authorized by:   Nolon Rod. Davieon Stockham MD   Signed by:   Nolon Rod. Shep Porter MD on 04/12/2010   Method used:   Print then Give to Patient   RxID:   [id number]    Orders Added: 1)  Venipuncture [30865] 2)  TLB-CBC Platelet - w/Differential [85025-CBCD] 3)  TLB-BMP (Basic Metabolic Panel-BMET) [80048-METABOL] 4)  TLB-ALT (SGPT) [84460-ALT] 5)  TLB-AST (SGOT) [84450-SGOT] 6)  TLB-TSH (Thyroid Stimulating Hormone) [84443-TSH] 7)  Specimen Handling [99000] 8)  Est. Patient Level IV [78469]

## 2010-07-26 NOTE — Letter (Signed)
Summary: Work Dietitian at [employer]  [address] [place], [place] [address]   Phone: [phone]  Fax: [phone]    Today's Date: April 12, 2010  Name of Patient: Danny [name]  The above named patient had a medical visit today at: 3:40  am / pm.  Please take this into consideration when reviewing the time away from work/school.    Special Instructions:  [ X ] None  [  ] To be off the remainder of today, returning to the normal work / school schedule tomorrow.  [  ] To be off until the next scheduled appointment on ______________________.  [  ] Other ________________________________________________________________ ________________________________________________________________________   Sincerely yours,   [name], MD

## 2010-07-26 NOTE — Assessment & Plan Note (Signed)
Summary: 5 mth fu/ns/kdc   Vital Signs:  Patient profile:   50 year old male Height:      71 inches Weight:      224 pounds Temp:     97.8 degrees F oral Pulse rate:   66 / minute BP sitting:   110 / 62  (left arm)  Vitals Entered By: Jeremy Johann CMA (December 14, 2009 9:31 AM) CC: F/U  Comments --NOT FASTING --DISCUSS ADMISSION TO HOSPITAL ---REFERRAL TO CARDIOLOGY REVIEWED MED LIST, PATIENT AGREED DOSE AND INSTRUCTION CORRECT    History of Present Illness: here w/ girlfriend Fannie Knee who is a ER nurse    was admitted to [facility] with a new diagnosis of atrial fibrillation with RVR He was temporarily on a Cardizem drip, he converted to sinus rhythm, he was discharged home 12-12-09. He is now on pradaxa, declined other anticoagulants Records reviewed: Echocardiogram 12-10-09 showed normal EF, mild mitral regurgitation and trace tricuspid regurgitation Labs from 12-12-09 Total cholesterol 150, TG 267, LDL 33 TFTs normal A1c 9.5  Diabetes -per Dr. Everardo All, note reviewed  was seen by me w/  abdominal pain ---->  resolved     Allergies: 1)  ! Metformin Hcl (Metformin Hcl)  Past History:  Past Medical History: Reviewed history from 11/05/2009 and no changes required. Allergic rhinitis Diabetes mellitus, type II, dx 1999 Hypertension Benign prostatic hypertrophy, s/p TUNA procedure aprox 2006 neg cystoscopy @ urology 11-09, was Rx Flomax  ED after TUNA elevated LFTs: (-) chronic hep  panel 2010  Past Surgical History: Reviewed history from 11/04/2008 and no changes required. Vasectomy TUNA  Social History: Reviewed history from 11/04/2008 and no changes required. separated from wife, has a girlfrined  two children lives in [place] tobacco--quit ETOH-- socially  Works for [employer] (state)  diet-- much better, has lost some wt, feel great exercise -- very active   Review of Systems       since he left the hospital 2 days ago he is feeling well He had  palpitationsback shortness of breath-nausea with the onset of atrial fibrillation, he is now asymptomatic Denies chest pain No vomiting or diarrhea No diaphoresis No blood in the stools or in the urine  Physical Exam  General:  alert, well-developed, and well-nourished.   Lungs:  normal respiratory effort, no intercostal retractions, no accessory muscle use, and normal breath sounds.   Heart:  normal rate, regular rhythm, and no murmur.   Abdomen:  soft, non-tender, no distention, and no masses.   Extremities:  no edema   Impression & Recommendations:  Problem # 1:  ATRIAL FIBRILLATION (ICD-427.31) Assessment New status post a recent admission with A. fib and RVR, now on sinus rhythm and anticoagulated with pradaxa Will refer to cardiology, likes [facility] in [place] EKG today shows sinus bradycardia, 50, no acute changes, no change from previous EKGs  His updated medication list for this problem includes:    Metoprolol Tartrate 25 Mg Tabs (Metoprolol tartrate) .Marland Kitchen... 1/2 every am  Orders: Cardiology Referral (Cardiology)  Problem # 2:  ABDOMINAL PAIN, LOWER (ICD-789.09) resolved  Problem # 3:  DYSLIPIDEMIA (ICD-272.4) last FLP  at the hospital showed elevated triglycerides Observation for now, anticipate triglycerides will decrease once diabetes is better. Continue with simvastatin His updated medication list for this problem includes:    Simvastatin 20 Mg Tabs (Simvastatin) .Marland Kitchen... Take one tablet at bedtime  Problem # 4:  DIABETES MELLITUS, TYPE II, UNCONTROLLED (ICD-250.02) per Dr. Everardo All, recently started on Lantus His updated  medication list for this problem includes:    Lantus Solostar 100 Unit/ml Soln (Insulin glargine) .Marland Kitchen... 25 units at bedtime x1week    Glimepiride 4 Mg Tabs (Glimepiride) .Marland Kitchen... 1/2 tab  two times a day  Complete Medication List: 1)  Hydrochlorothiazide 25 Mg Tabs (Hydrochlorothiazide) .... 1/2 once daily 2)  Flonase 50 Mcg/act Susp (Fluticasone  propionate) .... As needed 3)  Onetouch Ultra Test Strp (Glucose blood) .... One time daily 4)  Flomax 0.4 Mg Xr24h-cap (Tamsulosin hcl) .Marland Kitchen.. 1 by mouth once daily 5)  Simvastatin 20 Mg Tabs (Simvastatin) .... Take one tablet at bedtime 6)  One Touch Delica Lancets Misc (Lancets) .... Checks blood sugar daily, dx 250.00 7)  One Touch Test Strips  .... Checks blood sugar daily, dx 250.00 8)  Fish Oil 1000 Mg Caps (Omega-3 fatty acids) .... 4000mg  two times a day 9)  Pradaxa 150 Mg Caps (Dabigatran etexilate mesylate) .Marland Kitchen.. 1 two times a day 10)  Metoprolol Tartrate 25 Mg Tabs (Metoprolol tartrate) .... 1/2 every am 11)  Lantus Solostar 100 Unit/ml Soln (Insulin glargine) .... 25 units at bedtime x1week 12)  Glimepiride 4 Mg Tabs (Glimepiride) .... 1/2 tab  two times a day 13)  Bromocriptine Mesylate 2.5 Mg Tabs (Bromocriptine mesylate) .... 1/2 tab once daily  Patient Instructions: 1)  Please schedule a follow-up appointment in 4  months .

## 2010-07-26 NOTE — Progress Notes (Signed)
Summary: labs okay, cardiology will contact the patient  Phone Note Outgoing Call   Summary of Call: advise patient: all his labs are fine line I spoke with Dr. Kirke Corin, they are planning to call him and make an appointment with them. Please fax my office visit note, last EKG and recent labs to Dr. Waynetta Pean E. Juanjesus Pepperman MD  April 14, 2010 1:05 PM   Follow-up for Phone Call        left message for pt to call back. faxed all information to dr.ardia.  Follow-up by: Army Fossa CMA,  April 14, 2010 1:45 PM  Additional Follow-up for Phone Call Additional follow up Details #1::        Pt is aware. Army Fossa CMA  April 14, 2010 4:33 PM

## 2010-07-26 NOTE — Letter (Signed)
Summary: Work Dietitian at [employer]  [address] [place], [place] [address]   Phone: [phone]  Fax: [phone]    Today's Date: August 11, 2009  Name of Patient: Danny [name]  The above named patient had a medical visit today.  Please take this into consideration when reviewing the time away from work/school.    Special Instructions:  [x  ] None  [  ] To be off the remainder of today, returning to the normal work / school schedule tomorrow.  [  ] To be off until the next scheduled appointment on ______________________.  [  ] Other ________________________________________________________________ ________________________________________________________________________   Sincerely yours,   Shary Decamp

## 2010-07-26 NOTE — Progress Notes (Signed)
  Phone Note Outgoing Call   Summary of Call: Spoke with Dr. Freida Busman re: changing med to Cardizem CD from Lopressor - Dr. Freida Busman to call patient. Initial call taken by: Dessie Coma,  December 22, 2009 11:25 AM

## 2010-07-26 NOTE — Assessment & Plan Note (Signed)
Summary: 1 mo rov /nws  #   Vital Signs:  Patient profile:   50 year old male Height:      71 inches (180.34 cm) Weight:      222.50 pounds (101.14 kg) BMI:     31.14 O2 Sat:      97 % on Room air Temp:     97.9 degrees F (36.61 degrees C) oral Pulse rate:   55 / minute BP sitting:   128 / 66  (left arm) Cuff size:   large  Vitals Entered By: Brenton Grills MA (January 13, 2010 4:30 PM)  O2 Flow:  Room air CC: 1 mo ROV/aj Is Patient Diabetic? Yes Comments Pt is no longer taking Bromocriptine, Glimepiride, Metoprolol, or Bromocriptine/Pt is taking Multach 180mg  1 two times a day    Referring Provider:  paz  CC:  1 mo ROV/aj.  History of Present Illness: pt has stopped all oral dm meds.  he has increased lantus to 50 units once daily.  no cbg record, but states cbg's in am are low-100's.  later in the day, it is in the mid-100's.       Current Medications (verified): 1)  Hydrochlorothiazide 25 Mg Tabs (Hydrochlorothiazide) .... 1/2 Once Daily 2)  Flonase 50 Mcg/act Susp (Fluticasone Propionate) .... As Needed 3)  Onetouch Ultra Test  Strp (Glucose Blood) .... One Time Daily 4)  Flomax 0.4 Mg Xr24h-Cap (Tamsulosin Hcl) .Marland Kitchen.. 1 By Mouth Once Daily 5)  Simvastatin 20 Mg Tabs (Simvastatin) .... Take One Tablet At Bedtime 6)  One Touch Delica Lancets  Misc (Lancets) .... Checks Blood Sugar Daily, Dx 250.00 7)  One Touch Test Strips .... Checks Blood Sugar Daily, Dx 250.00 8)  Fish Oil 1000 Mg Caps (Omega-3 Fatty Acids) .... 4000mg  Two Times A Day 9)  Pradaxa 150 Mg Caps (Dabigatran Etexilate Mesylate) .Marland Kitchen.. 1 Two Times A Day 10)  Metoprolol Tartrate 25 Mg Tabs (Metoprolol Tartrate) .... 1/2 Every Am 11)  Lantus Solostar 100 Unit/ml Soln (Insulin Glargine) .... 50 Units At Bedtime 12)  Glimepiride 4 Mg Tabs (Glimepiride) .... 1/2 Tab  Two Times A Day 13)  Bromocriptine Mesylate 2.5 Mg Tabs (Bromocriptine Mesylate) .... 1/2 Tab Once Daily  Allergies (verified): 1)  ! Metformin Hcl  (Metformin Hcl)  Past History:  Past Medical History: Last updated: 11/05/2009 Allergic rhinitis Diabetes mellitus, type II, dx 1999 Hypertension Benign prostatic hypertrophy, s/p TUNA procedure aprox 2006 neg cystoscopy @ urology 11-09, was Rx Flomax  ED after TUNA elevated LFTs: (-) chronic hep  panel 2010  Review of Systems  The patient denies hypoglycemia.    Physical Exam  General:  normal appearance.   Psych:  Alert and cooperative; normal mood and affect; normal attention span and concentration.     Impression & Recommendations:  Problem # 1:  DIABETES MELLITUS, TYPE II, UNCONTROLLED (ICD-250.02) Assessment Improved  Medications Added to Medication List This Visit: 1)  Lantus Solostar 100 Unit/ml Soln (Insulin glargine) .... 35 units at bedtime 2)  Humalog Kwikpen 100 Unit/ml Soln (Insulin lispro (human)) .... 5 units three times a day (just before each meal) 3)  Bd Pen Needle Short U/f 31g X 8 Mm Misc (Insulin pen needle) .... 4x a day  Other Orders: Est. Patient Level III ([address])  Patient Instructions: 1)  reduce lantus to 35 units once daily 2)  start humalog or novolog or apidra pen, 5 units three times a day (just before each meal). 3)  check your blood  sugar 2 times a day.  vary the time of day when you check, between before the 3 meals, and at bedtime.  also check if you have symptoms of your blood sugar being too high or too low.  please keep a record of the readings and bring it to your next appointment here.  please call us sooner if you are having low blood sugar episodes. 4)  please consider the insulin pump.  let me know if you wish to see a pump trainer to discuss further.   5)  Please schedule a follow-up appointment in 6 weeks. Prescriptions: LANTUS SOLOSTAR 100 UNIT/ML SOLN (INSULIN GLARGINE) 35 units at bedtime  #1 box x 11   Entered and Authorized by:   [name] MD   Signed by:   [name] MD on 01/13/2010   Method used:    Electronically to        [facility] [address] (retail)       [address]       [place], [place]  [address]       Ph: [phone] or [phone]       Fax: [phone]   RxID:   [id number] BD PEN NEEDLE SHORT U/F 31G X 8 MM MISC (INSULIN PEN NEEDLE) 4x a day  #120 x 11   Entered and Authorized by:   [name] MD   Signed by:   [name] MD on 01/13/2010   Method used:   Electronically to        [facility] [address] (retail)       [address]       [place], [place]  [address]       Ph: [phone] or [phone]       Fax: [phone]   RxID:   [id number]

## 2010-07-26 NOTE — Progress Notes (Signed)
Summary: OV TUESDAY  Phone Note Call from Patient   Summary of Call: Pt c/o elevated cbgs. He is taking meds as directed. Last 3 cbgs were 238, 258, & 268 fasting. Pt is f/u tuesday pm.  Initial call taken by: Lamar Sprinkles, CMA,  July 23, 2009 5:11 PM  Follow-up for Phone Call        increase glimepiride to 4 mg each am. Follow-up by: [name] MD,  July 25, 2009 10:52 AM  Additional Follow-up for Phone Call Additional follow up Details #1::        left message on machine for pt to return my call, call pt at work between 3-3:30 Additional Follow-up by: Margaret Pyle, CMA,  July 26, 2009 10:21 AM    Additional Follow-up for Phone Call Additional follow up Details #2::    left message on machine for pt to return my call, pt has appt with MD tomorrow Follow-up by: Margaret Pyle, CMA,  July 26, 2009 4:34 PM

## 2010-07-26 NOTE — Assessment & Plan Note (Signed)
Summary: Cardiology Nuclear Testing  Nuclear Med Background Indications for Stress Test: Evaluation for Ischemia  Indications Comments: 6/11 new diagnosed PAF  History: Heart Catheterization  History Comments: Heart cath approx. 15 yrs ago(no report)  Symptoms: DOE, Fatigue    Nuclear Pre-Procedure Cardiac Risk Factors: History of Smoking, IDDM Type 2, Lipids Caffeine/Decaff Intake: None NPO After: 7:00 AM IV 0.9% NS with Angio Cath: 22g     IV Site: (R) Hand IV Started by: Edwyna Perfect, RN Chest Size (in) 44     Height (in): 71 Weight (lb): 220 BMI: 30.79 Tech Comments: Pt is not taking Metoprolol at htis time.  Nuclear Med Study 1 or 2 day study:  1 day     Stress Test Type:  Stress Reading MD:  Willa Rough, MD     Referring MD:  Lorine Bears Resting Radionuclide:  Technetium 6m Tetrofosmin     Resting Radionuclide Dose:  11 mCi  Stress Radionuclide:  Technetium 37m Tetrofosmin     Stress Radionuclide Dose:  32.8 mCi   Stress Protocol Exercise Time (min):  11.00 min     Max HR:  146 bpm     Predicted Max HR:  169 bpm  Max Systolic BP: 171 mm Hg     Percent Max HR:  86.39 %     METS: 12.90 Rate Pressure Product:  16[address]    Stress Test Technologist:  Stanton Kidney EMT-P     Nuclear Technologist:  Doyne Keel  Rest Procedure  Myocardial perfusion imaging was performed at rest 45 minutes following the intravenous administration of Myoview Technetium 38m Tetrofosmin.  Stress Procedure  The patient exercised for 11 minutes.  The patient stopped due to fatigue and denied any chest pain.  There were non specific ST-T wave changes.  Myoview was injected at peak exercise and myocardial perfusion imaging was performed after a brief delay.  QPS Raw Data Images:  Patient motion noted; appropriate software correction applied. Stress Images:  There is normal uptake in all areas. Rest Images:  Normal homogeneous uptake in all areas of the myocardium. Subtraction (SDS):  No  evidence of ischemia. Transient Ischemic Dilatation:  1.09  (Normal <1.22)  Lung/Heart Ratio:  .38  (Normal <0.45)  Quantitative Gated Spect Images QGS EDV:  118 ml QGS ESV:  37 ml QGS EF:  69 % QGS cine images:  Normal motion  Findings Normal nuclear study      Overall Impression  Exercise Capacity: Good exercise capacity. BP Response: Normal blood pressure response. Clinical Symptoms: No chest pain ECG Impression: There is ST depression with stress that resolves immmediately in recovery Overall Impression: Normal stress nuclear study.

## 2010-07-26 NOTE — Assessment & Plan Note (Signed)
Summary: 6 WK ROV/NWS  #   Vital Signs:  Patient profile:   50 year old male Height:      71 inches (180.34 cm) Weight:      226.38 pounds (102.90 kg) BMI:     31.69 O2 Sat:      95 % on Room air Temp:     98.0 degrees F (36.67 degrees C) oral Pulse rate:   53 / minute BP sitting:   118 / 64  (left arm) Cuff size:   large  Vitals Entered By: Brenton Grills MA (February 21, 2010 4:23 PM)  O2 Flow:  Room air CC: 6 week F/U visit/pt is no longer taking Metoprolol/aj Is Patient Diabetic? Yes   Referring Provider:  paz  CC:  6 week F/U visit/pt is no longer taking Metoprolol/aj.  History of Present Illness: no cbg record, but states cbg's vary from 90 (yesterday afternoon) to 300 (hs).  he says ibs is worse recently.    Current Medications (verified): 1)  Hydrochlorothiazide 25 Mg Tabs (Hydrochlorothiazide) .... 1/2 Once Daily 2)  Flonase 50 Mcg/act Susp (Fluticasone Propionate) .... As Needed 3)  Onetouch Ultra Test  Strp (Glucose Blood) .... One Time Daily 4)  Flomax 0.4 Mg Xr24h-Cap (Tamsulosin Hcl) .Marland Kitchen.. 1 By Mouth Once Daily 5)  Simvastatin 20 Mg Tabs (Simvastatin) .... Take One Tablet At Bedtime 6)  One Touch Delica Lancets  Misc (Lancets) .... Checks Blood Sugar Daily, Dx 250.00 7)  Fish Oil 1000 Mg Caps (Omega-3 Fatty Acids) .... 4000mg  Two Times A Day 8)  Pradaxa 150 Mg Caps (Dabigatran Etexilate Mesylate) .Marland Kitchen.. 1 Two Times A Day 9)  Metoprolol Tartrate 25 Mg Tabs (Metoprolol Tartrate) .... 1/2 Every Am 10)  Lantus Solostar 100 Unit/ml Soln (Insulin Glargine) .... 35 Units At Bedtime 11)  Humalog Kwikpen 100 Unit/ml Soln (Insulin Lispro (Human)) .... 5 Units Three Times A Day (Just Before Each Meal) 12)  Bd Pen Needle Short U/f 31g X 8 Mm Misc (Insulin Pen Needle) .... 4x A Day 13)  Multaq 400 Mg Tabs (Dronedarone Hcl) .... Take One Tablet By Mouth Two Times A Day  Allergies (verified): 1)  ! Metformin Hcl (Metformin Hcl)  Past History:  Past Medical History: Last  updated: 11/05/2009 Allergic rhinitis Diabetes mellitus, type II, dx 1999 Hypertension Benign prostatic hypertrophy, s/p TUNA procedure aprox 2006 neg cystoscopy @ urology 11-09, was Rx Flomax  ED after TUNA elevated LFTs: (-) chronic hep  panel 2010  Review of Systems  The patient denies hypoglycemia.    Physical Exam  General:  normal appearance.   Skin:  insulin injection sites at triceps areas are normal    Impression & Recommendations:  Problem # 1:  DIABETES MELLITUS, TYPE II, UNCONTROLLED (ICD-250.02) needs increased rx  Medications Added to Medication List This Visit: 1)  Lantus Solostar 100 Unit/ml Soln (Insulin glargine) .... 20 units at bedtime 2)  Humalog Kwikpen 100 Unit/ml Soln (Insulin lispro (human)) .... Three times a day (just before each meal) 15-5-20 units  Other Orders: Est. Patient Level III (76195)  Patient Instructions: 1)  reduce lantus to 20 units once daily. 2)  take humalog or novolog or apidra pen, three times a day (just before each meal)  15-5-20 units. 3)  check your blood sugar 2 times a day.  vary the time of day when you check, between before the 3 meals, and at bedtime.  also check if you have symptoms of your blood sugar being too high  or too low.  please keep a record of the readings and bring it to your next appointment here.  please call us sooner if you are having low blood sugar episodes. 4)  Please schedule a follow-up appointment in 1 month.

## 2010-07-26 NOTE — Progress Notes (Signed)
  Phone Note Call from Patient   Caller: Spouse Summary of Call: T.C. from wife-Have 2 more days worth of Multaq 400mg  tablets that Dr. Kirke Corin gave him-per pt's wife, patient states has not felt like in A.Fib. since beginning Multaq.  Wants to know if needs RX so he can continue on medication.  [facility]  Call back phone # [phone]. Initial call taken by: Dessie Coma  LPN,  January 17, 2010 10:20 AM

## 2010-07-26 NOTE — Progress Notes (Signed)
Summary: Nuc Pre-Procedure  Phone Note Outgoing Call Call back at [facility] Phone [phone]   Call placed by: Antionette Char RN,  February 03, 2010 10:47 AM Call placed to: Patient Summary of Call: Reviewed information on Myoview Information Sheet (see scanned document for further details).  Spoke with patient's son.

## 2010-07-26 NOTE — Letter (Signed)
Summary: Work Dietitian at [employer]  [address] [place], [place] [address]   Phone: [phone]  Fax: [phone]    Today's Date: July 16, 2009  Name of Patient: Danny [name]  The above named patient had a medical visit today at:  am / pm.  Please take this into consideration when reviewing the time away from work/school.    Special Instructions:  [  ] None  [ x ] To be off the remainder of today, returning to the normal work / school schedule tomorrow.  [  ] To be off until the next scheduled appointment on ______________________.  [  ] Other ________________________________________________________________ ________________________________________________________________________   Sincerely yours,   Shary Decamp

## 2010-07-28 NOTE — Assessment & Plan Note (Signed)
Summary: 3 mth fu---stc   Vital Signs:  Patient profile:   50 year old male Height:      71 inches (180.34 cm) Weight:      225 pounds (102.27 kg) BMI:     31.49 O2 Sat:      97 % on Room air Temp:     98.2 degrees F (36.78 degrees C) oral Pulse rate:   56 / minute Pulse rhythm:   regular BP sitting:   102 / 64  (left arm) Cuff size:   large  Vitals Entered By: Brenton Grills CMA Duncan Dull) (July 07, 2010 4:29 PM)  O2 Flow:  Room air CC: 3 month F/U/aj Is Patient Diabetic? Yes   Referring Provider:  paz  CC:  3 month F/U/aj.  History of Present Illness: pt is recovering from a recent gi illness.  otherwise, it is well-controlled.  no cbg record, but states cbg's are highest in am (mid to high-100's--higher than at hs).  he had 1 episode of mild hypoglycemia at hs, after an unexpectedly small dinner.    Current Medications (verified): 1)  Hydrochlorothiazide 25 Mg Tabs (Hydrochlorothiazide) .... 1/2 Once Daily 2)  Flonase 50 Mcg/act Susp (Fluticasone Propionate) .... As Needed 3)  Onetouch Ultra Test  Strp (Glucose Blood) .... One Time Daily 4)  Flomax 0.4 Mg Xr24h-Cap (Tamsulosin Hcl) .Marland Kitchen.. 1 By Mouth Once Daily 5)  Simvastatin 20 Mg Tabs (Simvastatin) .... Take One Tablet At Bedtime 6)  One Touch Delica Lancets  Misc (Lancets) .... Checks Blood Sugar Daily, Dx 250.00 7)  Fish Oil 1000 Mg Caps (Omega-3 Fatty Acids) .... 4000mg  Two Times A Day 8)  Pradaxa 150 Mg Caps (Dabigatran Etexilate Mesylate) .Marland Kitchen.. 1 Two Times A Day 9)  Lantus Solostar 100 Unit/ml Soln (Insulin Glargine) .Marland Kitchen.. 15 Units At Bedtime 10)  Humalog Kwikpen 100 Unit/ml Soln (Insulin Lispro (Human)) .... Three Times A Day (Just Before Each Meal), 20-20-25 Units 11)  Bd Pen Needle Short U/f 31g X 8 Mm Misc (Insulin Pen Needle) .... 4x A Day 12)  Multaq 400 Mg Tabs (Dronedarone Hcl) .... Take One Tablet By Mouth Two Times A Day 13)  Ativan 0.5 Mg Tabs (Lorazepam) .... 1/2 To 1 By Mouth At Bedtime As Needed  Insomnia  Allergies (verified): 1)  ! Metformin Hcl (Metformin Hcl)  Past History:  Past Medical History: Last updated: 04/12/2010 Atrial Fibrilation, paroxysmal---dx 11-2009 8-11----neg stres test Allergic rhinitis Diabetes mellitus, type II, dx 1999 Hypertension Benign prostatic hypertrophy, s/p TUNA procedure aprox 2006 neg cystoscopy @ urology 11-09, was Rx Flomax  ED after TUNA elevated LFTs: (-) chronic hep  panel 2010  Review of Systems  The patient denies syncope.    Physical Exam  General:  normal appearance.   Skin:  injection sites at tricepa areas are without lesions.   Additional Exam:  Hemoglobin A1C       [H]  7.2 %    Impression & Recommendations:  Problem # 1:  DIABETES MELLITUS, TYPE II, UNCONTROLLED (ICD-250.02) he needs some adjustment in his therapy  Medications Added to Medication List This Visit: 1)  Lantus Solostar 100 Unit/ml Soln (Insulin glargine) .... 20 units at bedtime 2)  Humalog Kwikpen 100 Unit/ml Soln (Insulin lispro (human)) .... 20 units three times a day (just before each meal). 3)  Glucagon Emergency 1 Mg Kit (Glucagon (rdna)) .... As dir  Other Orders: TLB-A1C / Hgb A1C (Glycohemoglobin) (83036-A1C) Est. Patient Level III ([address])  Patient Instructions: 1)  blood  tests are being ordered for you today.  please call [phone] to hear your test results. 2)  pending the test results, please increase lantus to 20 units at bedtime, and: 3)  reduce humalog to (just before each meal) 20-20-20 units 4)   if exercise is anticipated, take only 10 units of humalog.  however, if you are not able to anticipate the exercise, eat a light snack with the exercise.   5)  Please schedule a follow-up appointment in 3 months. 6)  i have sent a prescription ofr "glucagon" (single-use emergency kit, for low-blood sugar) to your pharmacy. 7)  (update: i left message on phone-tree:  rx as we discussed) Prescriptions: GLUCAGON EMERGENCY 1 MG KIT (GLUCAGON  (RDNA)) as dir  #1 x 3   Entered and Authorized by:   [name] MD   Signed by:   [name] MD on 07/07/2010   Method used:   Electronically to        [facility] [address] (retail)       [address]       [place], [place]  [address]       Ph: [phone] or [phone]       Fax: [phone]   RxID:   [id number] HUMALOG KWIKPEN 100 UNIT/ML SOLN (INSULIN LISPRO (HUMAN)) 20 units three times a day (just before each meal).  #1 box x 11   Entered and Authorized by:   [name] MD   Signed by:   [name] MD on 07/07/2010   Method used:   Print then Give to Patient   RxID:   [id number]    Orders Added: 1)  TLB-A1C / Hgb A1C (Glycohemoglobin) [83036-A1C] 2)  Est. Patient Level III [30160]

## 2010-07-29 NOTE — Letter (Signed)
Summary: Care Consideration Regarding Eye Exam/[facility]  Care Consideration Regarding Eye [facility]   Imported By: Lanelle Bal 11/02/2009 13:03:40  _____________________________________________________________________  External Attachment:    Type:   Image     Comment:   External Document

## 2010-08-15 ENCOUNTER — Encounter (INDEPENDENT_AMBULATORY_CARE_PROVIDER_SITE_OTHER): Payer: Self-pay | Admitting: *Deleted

## 2010-08-15 ENCOUNTER — Ambulatory Visit (INDEPENDENT_AMBULATORY_CARE_PROVIDER_SITE_OTHER): Payer: BC Managed Care – PPO | Admitting: Internal Medicine

## 2010-08-15 ENCOUNTER — Encounter: Payer: Self-pay | Admitting: Internal Medicine

## 2010-08-15 DIAGNOSIS — R21 Rash and other nonspecific skin eruption: Secondary | ICD-10-CM

## 2010-08-15 DIAGNOSIS — M79609 Pain in unspecified limb: Secondary | ICD-10-CM

## 2010-08-15 DIAGNOSIS — M25579 Pain in unspecified ankle and joints of unspecified foot: Secondary | ICD-10-CM | POA: Insufficient documentation

## 2010-08-15 DIAGNOSIS — I4891 Unspecified atrial fibrillation: Secondary | ICD-10-CM

## 2010-08-15 DIAGNOSIS — I10 Essential (primary) hypertension: Secondary | ICD-10-CM

## 2010-08-23 NOTE — Letter (Signed)
Summary: Out of Work  [facility] [employer]  [address] [place], [place] [address]   Phone: [phone]  Fax: [phone]    August 15, 2010   Employee:  BROGHAN PANNONE    To Whom It May Concern:   For Medical reasons, please excuse the above named employee from work for the following dates:  Start:   08/15/10  End:   08/15/10  If you need additional information, please feel free to contact our office.         Sincerely,   [name], MD

## 2010-08-23 NOTE — Assessment & Plan Note (Signed)
Summary: 4 MONTH ROV/KN   Vital Signs:  Patient profile:   50 year old male Weight:      234.25 pounds Pulse rate:   78 / minute Pulse rhythm:   regular BP sitting:   116 / 72  (left arm) Cuff size:   large  Vitals Entered By: Army Fossa CMA (August 15, 2010 3:54 PM) CC: 4 month f/u Comments c/o pain in (B) legs x 1 month  [facility] [address] [facility] [place]   History of Present Illness: ROV doing well, does have LE "swelling" and hurting ; symptoms worse after working standing/walking all day. calves sore to touch also a rash in the LE started few weeks ago, + pruritus  ROS no CP-SOB he recognizes that goes on afib from time to time (palpitations)   good medication compliance w/ multaq and pradaxa no N-V-D-blood       Current Medications (verified): 1)  Hydrochlorothiazide 25 Mg Tabs (Hydrochlorothiazide) .... 1/2 Once Daily 2)  Flonase 50 Mcg/act Susp (Fluticasone Propionate) .... As Needed 3)  Onetouch Ultra Test  Strp (Glucose Blood) .... One Time Daily 4)  Flomax 0.4 Mg Xr24h-Cap (Tamsulosin Hcl) .Marland Kitchen.. 1 By Mouth Once Daily 5)  Simvastatin 20 Mg Tabs (Simvastatin) .... Take One Tablet At Bedtime 6)  One Touch Delica Lancets  Misc (Lancets) .... Checks Blood Sugar Daily, Dx 250.00 7)  Fish Oil 1000 Mg Caps (Omega-3 Fatty Acids) .... 4000mg  Two Times A Day 8)  Pradaxa 150 Mg Caps (Dabigatran Etexilate Mesylate) .Marland Kitchen.. 1 Two Times A Day 9)  Lantus Solostar 100 Unit/ml Soln (Insulin Glargine) .... 20 Units At Bedtime 10)  Humalog Kwikpen 100 Unit/ml Soln (Insulin Lispro (Human)) .... 20 Units Three Times A Day (Just Before Each Meal). 11)  Bd Pen Needle Short U/f 31g X 8 Mm Misc (Insulin Pen Needle) .... 4x A Day 12)  Multaq 400 Mg Tabs (Dronedarone Hcl) .... Take One Tablet By Mouth Two Times A Day 13)  Ativan 0.5 Mg Tabs (Lorazepam) .... 1/2 To 1 By Mouth At Bedtime As Needed Insomnia 14)  Glucagon Emergency 1 Mg Kit (Glucagon (Rdna)) .... As Dir  Allergies  (verified): 1)  ! Metformin Hcl (Metformin Hcl)  Past History:  Past Medical History: Reviewed history from 04/12/2010 and no changes required. Atrial Fibrilation, paroxysmal---dx 11-2009 8-11----neg stres test Allergic rhinitis Diabetes mellitus, type II, dx 1999 Hypertension Benign prostatic hypertrophy, s/p TUNA procedure aprox 2006 neg cystoscopy @ urology 11-09, was Rx Flomax  ED after TUNA elevated LFTs: (-) chronic hep  panel 2010  Past Surgical History: Reviewed history from 11/04/2008 and no changes required. Vasectomy TUNA  Social History: Reviewed history from 11/04/2008 and no changes required. separated from wife, has a girlfrined  two children lives in [place] tobacco--quit ETOH-- socially  Works for [employer] (state)  diet-- much better, has lost some wt, feel great exercise -- very active   Physical Exam  General:  alert and well-developed.   Pulses:  normal pedal pulses bilaterally , good cap refill B Extremities:  no pretibial edema bilaterally  calves symetric , no tender to palpation Skin:  few round macular, slightly  red, scaly lesions close to the ankles    Impression & Recommendations:  Problem # 1:  HYPERTENSION (ICD-401.9) at goal  His updated medication list for this problem includes:    Hydrochlorothiazide 25 Mg Tabs (Hydrochlorothiazide) .Marland Kitchen... 1/2 once daily  BP today: 116/72 Prior BP: 102/64 (07/07/2010)  Labs Reviewed: K+: 4.1 (04/12/2010) Creat: :  1.1 (04/12/2010)   Chol: 113 (12/12/2009)   HDL: 25.8 (12/12/2009)   LDL: 33.8 (12/12/2009)   TG: 267 (12/12/2009)  Problem # 2:  ATRIAL FIBRILLATION (ICD-427.31) paroxysmal a fib, per cards  His updated medication list for this problem includes:    Multaq 400 Mg Tabs (Dronedarone hcl) .Marland Kitchen... Take one tablet by mouth two times a day  Problem # 3:  LEG PAIN (ICD-729.5) vascular exam  normal calves symetric rec stretching for now  has taken NSAIDs, rec tylenol instead  Problem # 4:   alo has a rash rash, eczema vs fungal, see Rx   Complete Medication List: 1)  Hydrochlorothiazide 25 Mg Tabs (Hydrochlorothiazide) .... 1/2 once daily 2)  Flonase 50 Mcg/act Susp (Fluticasone propionate) .... As needed 3)  Onetouch Ultra Test Strp (Glucose blood) .... One time daily 4)  Flomax 0.4 Mg Xr24h-cap (Tamsulosin hcl) .Marland Kitchen.. 1 by mouth once daily 5)  Simvastatin 20 Mg Tabs (Simvastatin) .... Take one tablet at bedtime 6)  One Touch Delica Lancets Misc (Lancets) .... Checks blood sugar daily, dx 250.00 7)  Fish Oil 1000 Mg Caps (Omega-3 fatty acids) .... 4000mg  two times a day 8)  Pradaxa 150 Mg Caps (Dabigatran etexilate mesylate) .Marland Kitchen.. 1 two times a day 9)  Lantus Solostar 100 Unit/ml Soln (Insulin glargine) .... 20 units at bedtime 10)  Humalog Kwikpen 100 Unit/ml Soln (Insulin lispro (human)) .... 20 units three times a day (just before each meal). 11)  Bd Pen Needle Short U/f 31g X 8 Mm Misc (Insulin pen needle) .... 4x a day 12)  Multaq 400 Mg Tabs (Dronedarone hcl) .... Take one tablet by mouth two times a day 13)  Ativan 0.5 Mg Tabs (Lorazepam) .... 1/2 to 1 by mouth at bedtime as needed insomnia 14)  Glucagon Emergency 1 Mg Kit (Glucagon (rdna)) .... As dir 15)  Lotrisone 1-0.05 % Crea (Clotrimazole-betamethasone) .... Apply two times a day x 10 days  Patient Instructions: 1)  stretch the legs two times a day  2)  avoid motrin or motrin like medicines 3)  tylenol 500mg  2 by mouth every 6 hours as needed  4)  Please schedule a follow-up appointment in 4 to 6 months . Fasting , physical  Prescriptions: LOTRISONE 1-0.05 % CREA (CLOTRIMAZOLE-BETAMETHASONE) apply two times a day x 10 days  #1 x 0   Entered and Authorized by:   Shaquil Aldana E. Merrily Tegeler MD   Signed by:   Nolon Rod. Raisa Ditto MD on 08/15/2010   Method used:   Electronically to        [facility] [address] [address] (retail)       [address]       [place], [place]  [address]       Ph: [phone] or [phone]        Fax: [phone]   RxID:   [id number]    Orders Added: 1)  Est. Patient Level III [23557]

## 2010-09-01 ENCOUNTER — Telehealth: Payer: Self-pay | Admitting: Cardiovascular Disease

## 2010-09-06 NOTE — Progress Notes (Signed)
Summary: interaction with Multaq  Phone Note Outgoing Call   Call placed by: Dessie Coma  LPN,  September 01, 2010 4:15 PM Call placed to: Patient Summary of Call: Patient notified per Dr. Kirke Corin, [facility] had sent him notice that the Simvastatin he's on has an interaction with the Multaq.  Need to decrease Simvastatin to 10mg  at bedtime which is half of the 20mg  he was taking.  Patient voiced understanding.

## 2010-09-19 ENCOUNTER — Encounter: Payer: Self-pay | Admitting: Cardiovascular Disease

## 2010-09-29 ENCOUNTER — Ambulatory Visit (INDEPENDENT_AMBULATORY_CARE_PROVIDER_SITE_OTHER): Payer: BC Managed Care – PPO | Admitting: Cardiovascular Disease

## 2010-09-29 ENCOUNTER — Encounter: Payer: Self-pay | Admitting: Cardiovascular Disease

## 2010-09-29 DIAGNOSIS — I4891 Unspecified atrial fibrillation: Secondary | ICD-10-CM

## 2010-09-29 DIAGNOSIS — E785 Hyperlipidemia, unspecified: Secondary | ICD-10-CM | POA: Insufficient documentation

## 2010-09-29 DIAGNOSIS — I10 Essential (primary) hypertension: Secondary | ICD-10-CM | POA: Insufficient documentation

## 2010-09-29 NOTE — Assessment & Plan Note (Signed)
I decreased his dose of Simvastatin to 10 mg daily as this is the highest recommended dose on somebody who is taking Mutaq due to drug interaction. Will request a fasting lipid profile. If not at target, will recommend switching to Atorvastatin or Crestor.

## 2010-09-29 NOTE — Assessment & Plan Note (Signed)
His blood pressure is well controlled. Continue current treatment.

## 2010-09-29 NOTE — Progress Notes (Signed)
HPI  [name] is a 50 y/o male who is her for a follow up visit regarding atrial fibrillation. Overall, he has been doing reasonably well with no new events. He gets symptoms of palpitations on average of 2-3/ week. Mostly in evening hours and last for about 30 min. No chest pain, dyspnea or dizziness. No syncope. No reported side effects with medications.      Allergies  Allergen Reactions  . Metformin     REACTION: pt states caused liver injury some years ago     Current Outpatient Prescriptions on File Prior to Visit  Medication Sig Dispense Refill  . ALPRAZolam (XANAX) 0.5 MG tablet Take 0.5 mg by mouth 3 (three) times daily as needed.        . dabigatran (PRADAXA) 150 MG CAPS Take 150 mg by mouth every 12 (twelve) hours.        . dronedarone (MULTAQ) 400 MG tablet Take 400 mg by mouth 2 (two) times daily with a meal.        . fish oil-omega-3 fatty acids 1000 MG capsule Take 2 g by mouth 2 (two) times daily.        . hydrochlorothiazide 25 MG tablet Take 25 mg by mouth daily. Take 1/2 tablet every day       . insulin aspart (NOVOLOG) 100 UNIT/ML injection Inject 20 Units into the skin 3 (three) times daily before meals.        . insulin glargine (LANTUS) 100 UNIT/ML injection Inject 20 Units into the skin at bedtime.       . simvastatin (ZOCOR) 20 MG tablet Take 20 mg by mouth at bedtime. Takes 1/2 tablet daily.      . Tamsulosin HCl (FLOMAX) 0.4 MG CAPS Take 0.4 mg by mouth daily as needed.           Past Medical History  Diagnosis Date  . Diabetes mellitus   . Sexual dysfunction   . Atrial fibrillation 12/21/2009    paroxysmal  . Hyperlipidemia   . Hypertension      Past Surgical History  Procedure Date  . Vasectomy     several years ago  . Prostate surgery      Family History  Problem Relation Age of Onset  . Heart attack Mother      History   Social History  . Marital Status: Divorced    Spouse Name: N/A    Number of Children: N/A  . Years of  Education: N/A   Occupational History  . Not on file.   Social History Main Topics  . Smoking status: Former Smoker    Types: Cigarettes    Quit date: 06/26/1985  . Smokeless tobacco: Not on file  . Alcohol Use: No  . Drug Use: Yes  . Sexually Active:    Other Topics Concern  . Not on file   Social History Narrative  . No narrative on file     ROS Constitutional: Negative for fever, chills, diaphoresis, activity change, appetite change and fatigue.  HENT: Negative for hearing loss, nosebleeds, congestion, sore throat, facial swelling, drooling, trouble swallowing, neck pain, voice change, sinus pressure and tinnitus.  Eyes: Negative for photophobia, pain, discharge and visual disturbance.  Respiratory: Negative for apnea, cough, chest tightness, shortness of breath and wheezing.  Cardiovascular: Negative for chest pain and leg swelling.  Gastrointestinal: Negative for nausea, vomiting, abdominal pain, diarrhea, constipation, blood in stool and abdominal distention.  Genitourinary: Negative for dysuria, urgency, frequency, hematuria  and decreased urine volume.  Musculoskeletal: Negative for myalgias, back pain, joint swelling, arthralgias and gait problem.  Skin: Negative for color change, pallor, rash and wound.  Neurological: Negative for dizziness, tremors, seizures, syncope, speech difficulty, weakness, light-headedness, numbness and headaches.  Psychiatric/Behavioral: Negative for suicidal ideas, hallucinations, behavioral problems and agitation. The patient is not nervous/anxious.  .nl   PHYSICAL EXAM   BP 126/71  Pulse 57  Wt 230 lb 3.2 oz (104.418 kg)  SpO2 96% Constitutional: He is oriented to person, place, and time. He appears well-developed and well-nourished. No distress.  HENT: No nasal discharge.  Head: Normocephalic and atraumatic.  Eyes: Pupils are equal, round, and reactive to light. Right eye exhibits no discharge. Left eye exhibits no discharge.    Neck: Normal range of motion. Neck supple. No JVD present. No thyromegaly present.  Cardiovascular: Normal rate, regular rhythm, normal heart sounds and intact distal pulses. Exam reveals no gallop and no friction rub.  No murmur heard.  Pulmonary/Chest: Effort normal and breath sounds normal. No stridor. No respiratory distress. He has no wheezes. He has no rales. He exhibits no tenderness.  Abdominal: Soft. Bowel sounds are normal. He exhibits no distension. There is no tenderness. There is no rebound and no guarding.  Musculoskeletal: Normal range of motion. He exhibits no edema and no tenderness.  Neurological: He is alert and oriented to person, place, and time. Coordination normal.  Skin: Skin is warm and dry. No rash noted. He is not diaphoretic. No erythema. No pallor.  Psychiatric: He has a normal mood and affect. His behavior is normal. Judgment and thought content normal.      ASSESSMENT AND PLAN

## 2010-09-29 NOTE — Assessment & Plan Note (Signed)
He is staying mostly in sinus rhythm with Multaq. He does report few episodes/week of palpitations but he doesn't become tachycardiac. I asked him to monitor HR during these episodes and notify me if HR goes above 100.  Will continue Multaq for now. Continue long term anticoagulation with Pradexa. Will check liver profile as well.

## 2010-10-03 ENCOUNTER — Other Ambulatory Visit (INDEPENDENT_AMBULATORY_CARE_PROVIDER_SITE_OTHER): Payer: BC Managed Care – PPO

## 2010-10-03 DIAGNOSIS — E7800 Pure hypercholesterolemia, unspecified: Secondary | ICD-10-CM

## 2010-10-03 DIAGNOSIS — Z79899 Other long term (current) drug therapy: Secondary | ICD-10-CM

## 2010-10-03 LAB — LIPID PANEL
Cholesterol: 115 mg/dL (ref 0–200)
LDL Cholesterol: 60 mg/dL (ref 0–99)
Total CHOL/HDL Ratio: 4
VLDL: 26.2 mg/dL (ref 0.0–40.0)

## 2010-10-03 LAB — HEPATIC FUNCTION PANEL
Albumin: 3.9 g/dL (ref 3.5–5.2)
Alkaline Phosphatase: 89 U/L (ref 39–117)
Bilirubin, Direct: 0.1 mg/dL (ref 0.0–0.3)
Total Protein: 7 g/dL (ref 6.0–8.3)

## 2010-10-06 ENCOUNTER — Telehealth: Payer: Self-pay | Admitting: *Deleted

## 2010-10-06 NOTE — Telephone Encounter (Signed)
Message copied by Hardin Negus on Thu Oct 06, 2010  5:11 PM ------      Message from: Lorine Bears      Created: Thu Oct 06, 2010  2:21 PM       His cholesterol was fine. He can continue same dose of Simvastatin. Inform him.

## 2010-10-07 NOTE — Telephone Encounter (Signed)
I want him to get a 48 hour Holter monitor. Diagnosis: atrial fibrillation

## 2010-10-07 NOTE — Telephone Encounter (Signed)
Pt is aware of results.  Pt says that after he takes his PM meds he goes out of rhythm for the night, goes "in and out" last most of night. It started when we cut his simva in half about 2 weeks ago. Questioned if his tablets looked the same as his last refill and he did say yes. Told him I would talk to Dr Kirke Corin and call him back on Monday.

## 2010-10-08 ENCOUNTER — Other Ambulatory Visit: Payer: Self-pay | Admitting: Internal Medicine

## 2010-10-10 NOTE — Telephone Encounter (Signed)
Need to contact cardiology

## 2010-10-10 NOTE — Telephone Encounter (Signed)
Are you the prescribing doctor for this med?

## 2010-10-11 ENCOUNTER — Telehealth: Payer: Self-pay | Admitting: Cardiovascular Disease

## 2010-10-11 MED ORDER — DABIGATRAN ETEXILATE MESYLATE 150 MG PO CAPS: 150.0000 mg | ORAL_CAPSULE | Freq: Two times a day (BID) | ORAL | Status: DC

## 2010-10-11 NOTE — Telephone Encounter (Signed)
pradaxa 150mg  refilled [facility] [phone] pt out of med

## 2010-10-12 ENCOUNTER — Encounter (INDEPENDENT_AMBULATORY_CARE_PROVIDER_SITE_OTHER): Payer: BC Managed Care – PPO

## 2010-10-12 ENCOUNTER — Telehealth: Payer: Self-pay | Admitting: Cardiovascular Disease

## 2010-10-12 DIAGNOSIS — I4891 Unspecified atrial fibrillation: Secondary | ICD-10-CM

## 2010-10-12 NOTE — Telephone Encounter (Signed)
Pt will come in to the [place] office for a monitor today.

## 2010-10-12 NOTE — Telephone Encounter (Signed)
Son gave me cell # [phone] --- spoke with pt he will come in tomorrow around 4pm to have monitor put on

## 2010-10-13 ENCOUNTER — Ambulatory Visit (INDEPENDENT_AMBULATORY_CARE_PROVIDER_SITE_OTHER): Payer: BC Managed Care – PPO | Admitting: Endocrinology

## 2010-10-13 ENCOUNTER — Encounter: Payer: Self-pay | Admitting: Endocrinology

## 2010-10-13 ENCOUNTER — Other Ambulatory Visit (INDEPENDENT_AMBULATORY_CARE_PROVIDER_SITE_OTHER): Payer: BC Managed Care – PPO

## 2010-10-13 VITALS — BP 106/66 | HR 56 | Temp 97.6°F | Ht 71.0 in | Wt 235.6 lb

## 2010-10-13 DIAGNOSIS — E119 Type 2 diabetes mellitus without complications: Secondary | ICD-10-CM

## 2010-10-13 LAB — HEMOGLOBIN A1C: Hgb A1c MFr Bld: 8 % — ABNORMAL HIGH (ref 4.6–6.5)

## 2010-10-13 NOTE — Patient Instructions (Addendum)
blood tests are being ordered for you today.  please call [phone] to hear your test results. pending the test results, please continue lantus 20 units at bedtime, and: reduce humalog to (just before each meal) 20-15-20 units if exercise is anticipated, take only 10 units of humalog.  however, if you are not able to anticipate the exercise, eat a light snack with the exercise.   Please schedule a follow-up appointment in 3 months. (update: i left message on phone-tree:  rx as we discussed)

## 2010-10-13 NOTE — Telephone Encounter (Signed)
Spoke to Pt he could not come on thurs so he went to [address] office on Wed to have his monitor put on.

## 2010-10-13 NOTE — Progress Notes (Signed)
  Subjective:    Patient ID: Danny [name], male    DOB: 04-03-1961, 50 y.o.   MRN: [id number]  HPI no cbg record, but states cbg's are sometimes low before lunch, and in the afternoon.  This happens with activity, after he takes the full 20 units of humalog.  He says it has never happened after taking just 10 units.  He says he sometimes is not able to anticipate the activity. Past Medical History  Diagnosis Date  . Diabetes mellitus     Type II dx 1999  . 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 Hyperlipidemia   . Hypertension   . Atrial fibrillation 12/21/2009    paroxysmal  . Benign prostatic hypertrophy     s/p TUNA procedure aprx 2006,  . Carpal tunnel syndrome 06/17/2009  . HYPERSOMNIA UNSPECIFIED 12/11/2007  . INSOMNIA-SLEEP [facility] 04/12/2010   Past Surgical History  Procedure Date  . Vasectomy     several years ago  . Prostate surgery     TUNA  . Cystoscopy 11-09    neg  @ urology, was rx Flomax    reports that he quit smoking about 25 years ago. His smoking use included Cigarettes. He does not have any smokeless tobacco history on file. He reports that he drinks alcohol. He reports that he uses illicit drugs. family history includes Diabetes in his mother; Heart attack in his mother; Heart disease in his father and mother; and Hypertension in his father and mother.  There is no history of Cancer. Allergies  Allergen Reactions  . Metformin     REACTION: pt states caused liver injury some years ago    Review of Systems Denies syncope    Objective:   Physical Exam Pulses: dorsalis pedis intact bilat.   Feet: no deformity.  no ulcer on the feet.  feet are of normal color and temp.  no edema Neuro: sensation is intact to touch on the feet      Lab Results  Component Value Date   HGBA1C 8.0* 10/13/2010      Assessment & Plan:  Dm.  therapy limited by pt's need for a simple regimen, and his variable activity level

## 2010-10-20 ENCOUNTER — Ambulatory Visit: Payer: BC Managed Care – PPO | Admitting: Cardiovascular Disease

## 2010-10-27 ENCOUNTER — Encounter: Payer: Self-pay | Admitting: Cardiovascular Disease

## 2010-10-27 ENCOUNTER — Ambulatory Visit (INDEPENDENT_AMBULATORY_CARE_PROVIDER_SITE_OTHER): Payer: BC Managed Care – PPO | Admitting: Cardiovascular Disease

## 2010-10-27 VITALS — BP 152/80 | HR 70 | Ht 71.0 in | Wt 227.0 lb

## 2010-10-27 DIAGNOSIS — E785 Hyperlipidemia, unspecified: Secondary | ICD-10-CM

## 2010-10-27 DIAGNOSIS — I4891 Unspecified atrial fibrillation: Secondary | ICD-10-CM

## 2010-10-27 MED ORDER — FLECAINIDE ACETATE 50 MG PO TABS: 50.0000 mg | ORAL_TABLET | Freq: Two times a day (BID) | ORAL | Status: DC

## 2010-10-27 NOTE — Assessment & Plan Note (Signed)
His recent lipid profile was optimal except for chronically low HDL. Would continue treatment with simvastatin.

## 2010-10-27 NOTE — Progress Notes (Signed)
HPI  Danny [name] is a 50 year old male who is here today for a followup visit regarding his paroxysmal atrial fibrillation. During his last visit, he was doing reasonably well. However, because a few days later due to frequent episodes of palpitations associated with dyspnea. The patient is very aware when he goes in atrial fibrillation. Most of these episodes are happening in the evening and sometimes lasting the whole night. He usually goes back into normal rhythm by every morning. A 48-hour Holter monitor was performed which showed frequent episodes of atrial fibrillation with rapid ventricular response. He was actually in atrial fibrillation 34% of the time. The patient in the past was treated with metoprolol which really did not control his symptoms and his episodes. He was also bradycardic when he was in sinus rhythm. He did reasonably well with Multaq but this does not seem to be controlling his symptoms at this time. He did have a nuclear stress test last year which showed no evidence of ischemia with normal ejection fraction. His echocardiogram showed only borderline dilated left atrium.  Allergies  Allergen Reactions  . Metformin     REACTION: pt states caused liver injury some years ago     Current Outpatient Prescriptions on File Prior to Visit  Medication Sig Dispense Refill  . dabigatran (PRADAXA) 150 MG CAPS Take 1 capsule (150 mg total) by mouth every 12 (twelve) hours.  60 capsule  6  . fish oil-omega-3 fatty acids 1000 MG capsule Take 2 g by mouth 2 (two) times daily.        . hydrochlorothiazide 25 MG tablet Take 1/2 tablet every day      . insulin aspart (NOVOLOG) 100 UNIT/ML injection Inject 20 Units into the skin 3 (three) times daily before meals.        . insulin glargine (LANTUS) 100 UNIT/ML injection Inject 20 Units into the skin at bedtime.       . simvastatin (ZOCOR) 20 MG tablet 1/2 tablet daily at bedtime      . Tamsulosin HCl (FLOMAX) 0.4 MG CAPS Take 0.4 mg by  mouth daily.       Marland Kitchen DISCONTD: dronedarone (MULTAQ) 400 MG tablet Take 400 mg by mouth 2 (two) times daily with a meal.        . DISCONTD: ALPRAZolam (XANAX) 0.5 MG tablet Take 0.5 mg by mouth 3 (three) times daily as needed.           Past Medical History  Diagnosis Date  . Diabetes mellitus     Type II dx 1999  . 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 Hyperlipidemia   . Hypertension   . Benign prostatic hypertrophy     s/p TUNA procedure aprx 2006,  . Carpal tunnel syndrome 06/17/2009  . HYPERSOMNIA UNSPECIFIED 12/11/2007  . INSOMNIA-SLEEP DISORDER-UNSPEC 04/12/2010  . Atrial fibrillation 12/21/2009    paroxysmal     Past Surgical History  Procedure Date  . Vasectomy     several years ago  . Prostate surgery     TUNA  . Cystoscopy 11-09    neg  @ urology, was rx Flomax     Family History  Problem Relation Age of Onset  . Heart attack Mother   . Heart disease Mother     CHF  . Diabetes Mother   . Hypertension Mother   . Heart disease Father     CHF  . Hypertension Father   . Cancer Neg Hx  Negative FH of Colon or Prostate Cancer     History   Social History  . Marital Status: Divorced    Spouse Name: N/A    Number of Children: 2  . Years of Education: N/A   Occupational History  .      works for [employer]   Social History Main Topics  . Smoking status: Former Smoker    Types: Cigarettes    Quit date: 06/26/1985  . Smokeless tobacco: Not on file  . Alcohol Use: Yes     socially  . Drug Use: Yes  . Sexually Active:    Other Topics Concern  . Not on file   Social History Narrative   Separated from wife, [place] a girlfriend[place] [employer] [place])Diet--much better, has lost some wt, feel greatExercise-very actice       PHYSICAL EXAM   BP 152/80  Pulse 70  Ht 5\' 11"  (1.803 m)  Wt 227 lb (102.967 kg)  BMI 31.66 kg/m2  SpO2 96%  Constitutional: He is oriented to person, place, and time. He appears well-developed and  well-nourished. No distress.  HENT: No nasal discharge.  Head: Normocephalic and atraumatic.  Eyes: Pupils are equal, round, and reactive to light. Right eye exhibits no discharge. Left eye exhibits no discharge.  Neck: Normal range of motion. Neck supple. No JVD present. No thyromegaly present.  Cardiovascular: Normal rate, regular rhythm, normal heart sounds and intact distal pulses. Exam reveals no gallop and no friction rub.  No murmur heard.  Pulmonary/Chest: Effort normal and breath sounds normal. No stridor. No respiratory distress. He has no wheezes. He has no rales. He exhibits no tenderness.  Abdominal: Soft. Bowel sounds are normal. He exhibits no distension. There is no tenderness. There is no rebound and no guarding.  Musculoskeletal: Normal range of motion. He exhibits no edema and no tenderness.  Neurological: He is alert and oriented to person, place, and time. Coordination normal.  Skin: Skin is warm and dry. No rash noted. He is not diaphoretic. No erythema. No pallor.  Psychiatric: He has a normal mood and affect. His behavior is normal. Judgment and thought content normal.      EKG: Normal sinus rhythm with no significant ST or T wave changes. PR interval is normal. QT interval is normal.  ASSESSMENT AND PLAN

## 2010-10-27 NOTE — Patient Instructions (Signed)
Your physician recommends that you schedule a follow-up appointment in: 2 weeks  Your physician has recommended you make the following change in your medication: stop Multaq start flecainide 50 mg twice daily

## 2010-10-27 NOTE — Assessment & Plan Note (Signed)
The patient is having frequent episodes of atrial fibrillation with rapid ventricular response. His Holter monitor showed that he was in atrial fibrillation 34% of the time. He is usually very symptomatic during these episodes. The patient denies any symptoms suggestive of sleep apnea. He is obese and does not exercise regularly which might be contributing. I discussed with him different management options that include proceeding with radiofrequency ablation versus trying another antiarrhythmic medication. The patient prefers not to go through the procedure at this time. Thus, we'll stop Multaq and start him on flecainide 50 mg twice daily. This can be increased to 100 mg twice daily based on symptoms. We'll continue long-term anticoagulation with Pradaxa.

## 2010-11-08 NOTE — Assessment & Plan Note (Signed)
[facility] CARDIOLOGY OFFICE NOTE   CHIRON, CAMPIONE                     MRN:          [id number]  DATE:04/19/2010                            DOB:          October 02, 1960    [name] is 50 year old gentleman who is here today for a followup  visit.  He has the following problem list:  1. Paroxysmal atrial fibrillation diagnosed in June 2011.  2. Type 2 diabetes, diagnosed in his early 80s.  3. Hypertension.  4. Hyperlipidemia.  5. Family history of premature coronary artery disease.  6. No evidence of coronary artery disease.  Cardiac nuclear stress      test done in August 2011 was normal.   INTERVAL HISTORY:  [name] is here today as it was reported to [place]  that he was having more episodes of palpitations and breakthrough atrial  fibrillation.  This happened about 3 weeks ago.  Most of his  palpitations happened in the evening hours.  He was taking Multaq at 6  in the morning and then not taking the second dose until 7 or 8 o'clock.  Since then, he started paying more attention about making sure to take  it every 12 hours and for the last 2 weeks, he has not had any  palpitations.  He denies any chest pain, dyspnea, dizziness, or syncope.  He continues to take Multaq twice daily as well as Pradaxa for  anticoagulation.   PHYSICAL EXAMINATION:  VITAL SIGNS:  Weight is 232.8 pounds, blood  pressure is 133/74, pulse is 58, oxygen saturation is 97% on room air.  NECK:  No JVD or carotid bruits.  LUNGS:  Clear to auscultation.  HEART:  Regular rate and rhythm with no gallops or murmurs.  ABDOMEN:  Benign, nontender, nondistended.  EXTREMITIES:  With no clubbing, cyanosis, or edema.   An electrocardiogram was performed, which showed sinus bradycardia with  a heart rate of 57.  QT interval is normal.   IMPRESSION:  1. Paroxysmal atrial fibrillation:  The patient was having frequent      episodes of  breakthrough atrial fibrillation over the last 3-4      weeks.  However, he has not had any episodes over the last 2 weeks.      It seems that he was not taking his Multaq regularly every 12      hours.  At this time, I stressed to him the importance of taking      Multaq every 12 hours and specifically to be taken with food.  I      discussed with him further management options.  If he actually      fails Multaq and continues to have frequent episodes of atrial      fibrillation, then I would recommend proceeding with atrial      fibrillation ablation.  This procedure was explained to the      patient.  I do not favor going to have another antiarrhythmic      medication to avoid long-term toxicities.  For now, it seems that      his  symptoms has improved over the last 2 weeks and we will      continue to monitor.  We will continue long-term anticoagulation      with Pradaxa due to his [place] score of 2.  The patient will notify      [place] if his symptoms become more frequent and in that situation, I      will refer him to electrophysiology.  2. Hyperlipidemia.  We will continue with fish oil and simvastatin.      He informs me that he had blood work done recently.  I recommend      obtaining liver profile if not already done given that he is now on      Multaq.  I have been recommending checking liver function test one      time after initiating Multaq to ensure safety.  The patient will      follow up in 6 months from now or earlier if needed.     Lorine Bears, MD  Electronically Signed    MA/MedQ  DD: 04/19/2010  DT: 04/20/2010  Job #: [id number]

## 2010-11-08 NOTE — Letter (Signed)
December 21, 2009    [name], MD  [phone] [place], [place] [address]   RE:  Danny [name], Danny [name]  MRN:  [id number]  /  DOB:  1960/08/01   Dear Dr. Drue Novel,   I had the pleasure of seeing Mr. Cowin in clinic this morning.  As  you know, he is a 50 year old gentleman with diabetes and hypertension,  who was recently diagnosed with atrial fibrillation with rapid  ventricular response.  Since his hospital admission, the patient has not  had any recurrence of symptomatic atrial fibrillation.  He has been  compliant with Pradaxa and low dose Lopressor.  His echo shows a normal  ejection fraction and normal left atrial size and his lab work indicated  normal electrolytes and thyroid function.   I recommend the patient stay on Pradaxa and Lopressor for now.  I will  place him on a cardiac event monitor to assure he is not having  asymptomatic prolonged episodes of tachycardia at home.  In the future  if the patient has persistent episodes of atrial fibrillation with rapid  ventricular response, antiarrhythmic therapy would likely be considered  as titration up of any AV nodal blocking agents will likely cause him  significant bradycardia, which was demonstrated during his hospital stay  at [facility].   Thank you for referral of this patient and please contact my office if I  can be of further assistance.    Sincerely,     Brayton El, MD  Electronically Signed   SGA/MedQ  DD: 12/21/2009  DT: 12/21/2009  Job #: [id number]

## 2010-11-08 NOTE — Assessment & Plan Note (Signed)
[facility] CARDIOLOGY OFFICE NOTE   THEADORE, BLUNCK                     MRN:          [id number]  DATE:12/21/2009                            DOB:          [birth year]    CHIEF COMPLAINT:  Atrial fibrillation.   HISTORY OF PRESENT ILLNESS:  The patient is a 50 year old white male  with past medical history significant for diabetes, hypertension, BPH,  hyperlipidemia who is presenting after a recent hospital admission for  AFib with RPR.  The patient states he was in his normal state of health  until about a week ago when he was awoken in the middle of the night  with palpitations.  He states that his heart rate was very fast and he  felt mild nausea.  He reported to the emergency room where he was found  to be in atrial fibrillation with rapid ventricular response.  He was  rate controlled with Cardizem and shortly converted back to normal sinus  rhythm.  It appears that he was in the hospital for 2 nights.  Initially, he had several very short episodes of atrial fibrillation  after the initial conversion and then was able to maintain in normal  sinus rhythm.  He was started on Pradaxa for anticoagulation and  Lopressor initially 25 mg twice a day.  However, he had some bradycardia  down to the 40s.  This was decreased to 12.5 mg b.i.d.  The patient  states since the hospital discharge, he has done quite well.  He has not  had any recurrent palpitations.  The patient also denies any chest  discomfort, shortness of breath, weight loss, hematochezia or melena.   PAST MEDICAL HISTORY:  As above in HPI, in addition the patient has  allergic rhinitis and bladder outlet obstruction with BPH.   SOCIAL HISTORY:  History of tobacco.  He smoked for about 15 years, but  quit several years ago.  No significant alcohol intake.   FAMILY HISTORY:  The mother had an MI when she was 79.  He believes his  sister has atrial  fibrillation as well.   ALLERGIES:  No known drug allergies.   MEDICATIONS:  1. Pradaxa 150 b.i.d.  2. Lopressor 12.5 mg b.i.d.  3. HCTZ 12.5 mg daily.  4. Glimepiride.  5. Fish oil .  6. Lantus insulin.  7. Flomax.  8. Simvastatin 20 mg every evening.   REVIEW OF SYSTEMS:  As in HPI.  All other systems are reviewed and are  negative.   PHYSICAL EXAMINATION:  VITAL SIGNS:  His blood pressure is 128/73, pulse  54, satting 96% on room air and weighs 224 pounds.  GENERAL:  No acute distress.  HEENT:  Normocephalic, atraumatic.  NECK:  Supple.  No JVD.  No carotid bruits.  HEART:  Regular rate and rhythm without murmur, rub or gallop.  LUNGS:  Clear bilaterally.  ABDOMEN:  Soft, nontender, nondistended.  There are no masses palpated.  EXTREMITIES:  Without edema.  SKIN:  Warm and dry.  NEURO:  Nonfocal.  PSYCHIATRIC:  Appropriate with normal levels of insight.  MUSCULOSKELETAL:  5/5 in bilateral upper and lower extremity strength.   Review of the patient's labs from his hospital admission.  UA was  negative except for +3 glucose, hemoglobin is 16.5, white count is 8.1,  sodium 136, potassium 3.8, chloride 100, CO2 26, BUN 19, creatinine  0.85, glucose 313.  His TSH was 0.87 with a T4 of 7.6 and a free T4 of  2.6 and a T3 of 34.  EKG taken today in clinic independently reviewed by  myself demonstrates sinus bradycardia with a rate of approximately 53  beats per minute compared with EKG dated June 16.  He is no longer in  atrial fibrillation.  On June 16, EKG showed atrial fibrillation with  rapid ventricular response with a rate of 124 beats per minute.  Review  of the patient's echocardiogram report ejection fraction was 55%, left  atrial size was reported as 4.0 cm.  There is mild-to-mitral  regurgitation.   ASSESSMENT:  A 50 year old white male with hypertension and diabetes  presenting with recent episode of newly-diagnosed atrial fibrillation  with rapid ventricular  response.  The patient appeared to be clearly  symptomatic during the arrhythmia and has not had any recurrence since.   PLAN:  The patient had a CHADS 2 score of 2 indicating that he should be  anticoagulated.  We will continue on Pradaxa.  I will place him on a  cardiac event monitor to assure he is not having any significant  episodes of tachycardia at home that are asymptomatic.  He should  continue on the low-dose Lopressor for now.  If rapid ventricular  response becomes more of an issue in the past, he may require  antiarrhythmic therapy as AV nodal blocking agents will likely cause  significant bradycardia with him.  We will see the patient back in 6  weeks' time and he will contact our office in the interim if a problem  arises.     Brayton El, MD  Electronically Signed    SGA/MedQ  DD: 12/21/2009  DT: 12/21/2009  Job #: [id number]

## 2010-11-08 NOTE — Assessment & Plan Note (Signed)
[name] CARDIOLOGY OFFICE NOTE   Danny [name], Danny [name]                     MRN:          [id number]  DATE:01/04/2010                            DOB:          [birth year]    PROBLEM LIST:  1. Paroxysmal atrial fibrillation, diagnosed in June 2011.  2. Type 2 diabetes, which was diagnosed when he was in his early 40s.  3. Hypertension.  4. Hyperlipidemia.   INTERVAL HISTORY:  Danny [name] was admitted in June 2011 with newly  diagnosed atrial fibrillation with rapid ventricular response.  At that  time, he was treated with metoprolol and Cardizem.  He converted to  sinus rhythm.  Initially, he was treated with metoprolol; however, he  was noted to have bradycardia when he was not in atrial fibrillation.  Thus, he was later switched to Cardizem.  He was seen by Dr. Freida Busman and  an event monitor was placed.  Since then, the patient continues to have  breakthrough of palpitations associated with dyspnea and feeling tired.  The patient is aware when he goes in atrial fibrillation.  On average,  he is having these episodes about 3 times a week and each episodes last  for few hours.  His event monitor showed 2 prolonged episodes on January 01, 2010 and January 03, 2010.  Otherwise, the patient denies any previous  history of coronary artery disease.  He did have cardiac  catheterizations in the past but that was many years ago.  He does not  have chest pain, but does complain of exertional dyspnea, which has been  chronic.   SOCIAL HISTORY:  He quit smoking 15 years ago.   ALLERGIES:  No known drug allergies.   CURRENT MEDICATIONS:  1. Hydrochlorothiazide 25 mg 1/2 tablet once daily.  2. Fish oil 1000 mg 2 capsules twice daily.  3. Pradaxa 150 mg twice daily.  4. Insulin Lantus 45 units once daily.  5. Flomax 0.4 mg once daily.  6. Simvastatin 20 mg once at bedtime.  7. Cardizem CD 180 mg once daily.   PHYSICAL  EXAMINATION:  GENERAL:  The patient is currently in no acute  distress.  VITAL SIGNS:  His weight is 226 pounds, blood pressure is 124/72 in the  left arm, heart rate 64, oxygen saturation is 96% on room air.  NECK:  No JVD or carotid bruits.  LUNGS:  Clear to auscultation.  CARDIOVASCULAR:  Regular rate and rhythm with no gallops or murmurs.  ABDOMEN:  Benign, nontender, nondistended.  EXTREMITIES:  No clubbing, cyanosis, or edema.   ASSESSMENT AND PLAN:  1. Paroxysmal atrial fibrillation:  The patient is clearly symptomatic      during these episodes.  He is having frequent episodes in spite of      using metoprolol initially and now Cardizem.  He is clearly failing      these 2 medications.  The other limiting factor is that he is      somewhat bradycardic when he is in sinus rhythm on these      medications.  Thus, I think that he needs  an antiarrhythmic      medication to maintain sinus rhythm.  Due to that, I recommended      starting him on Multaq 600 mg twice daily.  Side effects were      explained.  His CHADS2 score is 2, and I agree with long-term      anticoagulation.  This will be achieved with Pradaxa, which he is      already on.  We will continue with the event monitor to see his      response to Multaq.  His echocardiogram showed normal left      ventricular systolic function with mildly dilated left atrium and      only mild mitral regurgitation.  2. Exertional dyspnea with multiple risk factors for coronary artery      disease:  I recommended performing stress test once we maintain      sinus rhythm.  He has been diabetic for more than 20 years.  3. Hyperlipidemia:  His lipid profile showed an LDL of 33,      triglyceride 267, HDL was 25.8.  He was started on fish oil after      that.  We will repeat his lipid profile in few months and consider      adding another agent such as fibrate or niacin.  The patient will      follow up in 6 weeks from now.     Lorine Bears, MD  Electronically Signed    MA/MedQ  DD: 01/05/2010  DT: 01/05/2010  Job #: [id number]   cc:   [name], MD

## 2010-11-08 NOTE — Assessment & Plan Note (Signed)
[facility] CARDIOLOGY OFFICE NOTE   Danny [name], Danny [name]                     MRN:          [id number]  DATE:02/01/2010                            DOB:          06-May-1961    PROBLEM LIST:  1. Paroxysmal atrial fibrillation diagnosed in June 2011.  2. Type 2 diabetes diagnosed in his early 73s.  3. Hypertension.  4. Hyperlipidemia.  5. Family history of premature coronary artery disease.   INTERVAL HISTORY:  I saw [name] on January 05, 2010, for atrial  fibrillation.  At that time, he was having frequent episodes of  palpitations and breakthrough atrial fibrillation documented by his  event monitor.  He had these episodes in spite of being on Cardizem  initially and later on metoprolol.  He was highly symptomatic during  these episodes unless it was decided to switch him to an antiarrhythmic  medication.  Metoprolol was stopped and Multaq 400 mg twice daily was  initiated.  Since then, he had no episodes of palpitations at all.  We  continued the event monitor after that and it showed no further episodes  of atrial fibrillation.  The only side effect he is having with the  medication is mild diarrhea which seems to be getting better.  He denies  any chest pain, but he does have exertional dyspnea which has improved  some after achieving sinus rhythm.   CURRENT MEDICATIONS:  1. Multaq 400 mg twice daily.  2. Hydrochlorothiazide 25 mg half tablet once daily.  3. Fish oil 1000 mg 2 capsules twice daily.  4. Pradaxa 150 mg twice a day.  5. Insulin Lantus at bedtime.  6. Flomax 0.4 mg once a day.  7. Simvastatin 20 mg at bedtime.  8. NovoLog 5 units t.i.d.   ALLERGIES:  No known drug allergies.   PHYSICAL EXAMINATION:  GENERAL:  The patient is in no acute distress.  He is well-nourished and pleasant.  VITAL SIGNS:  Weight is 223 pounds, blood pressure 126/72, pulse is 56,  oxygen saturation is 96% on room  air.  HEENT:  Atraumatic and normocephalic.  NECK:  Reveals no masses, JVD or carotid bruits.  LUNG:  Normal respiratory effort with no use of accessory muscles.  Auscultation reveals normal breath sounds with no crackles or wheezing.  CARDIOVASCULAR:  Normal PMI.  Regular rate and rhythm with normal S1 and  S2 without any gallops or murmurs.  ABDOMEN:  Benign, nontender, and nondistended.  EXTREMITIES:  With no clubbing, cyanosis, or edema.  SKIN:  Dry and intact with no rash.  PSYCHIATRIC:  He is alert, oriented x3, with normal mood and affect.  NEUROLOGIC:  Nonfocal.   ASSESSMENT AND PLAN:  1. Paroxysmal atrial fibrillation:  He is currently in sinus rhythm      after he was initiated on Multaq.  He has had no further episodes      of palpitations.  We will continue with his medication as well as      long term anticoagulation with Pradaxa.  His only side effect with      Multaq is  mild diarrhea which seems to be improving.  During his      next visit, we will consider checking his liver profile, although      until now this is not the standard of care in spite of recent FDA      warning about possible liver side effects with Multaq.  At this      time, I feel that the benefits outweigh the risk and if he feels      Multaq, we will refer him for atrial fibrillation ablation.  2. Exertional dyspnea with multiple risk factors for coronary artery      disease including prolonged history of diabetes as well as family      history of premature coronary artery disease.  Obviously, he will      need to make sure that this is not anginal equivalent, and thus I      recommend proceeding with a nuclear stress test for further      evaluation.  3. Hyperlipidemia:  Most recently, his LDL was 33 which is at target,      but he had a triglyceride of 267 with an HDL of 25.  He has been on      fish oil since then, and will likely repeat his lipid profile and      liver test in few months.  The  patient will be notified about the      results of stress test.  Otherwise, he will return to see me in 4      months from now.     Lorine Bears, MD  Electronically Signed    MA/MedQ  DD: 02/01/2010  DT: 02/02/2010  Job #: [id number]

## 2010-11-11 ENCOUNTER — Telehealth: Payer: Self-pay | Admitting: Cardiovascular Disease

## 2010-11-11 NOTE — Telephone Encounter (Signed)
Pt needs to know what to take for sinus infection otc that will not interfere with his medication.

## 2010-11-11 NOTE — Telephone Encounter (Signed)
Pt wants to know what can he get over the counter for his sinus.

## 2010-11-11 NOTE — Telephone Encounter (Signed)
Told him not to take a decongestant.

## 2010-11-14 ENCOUNTER — Ambulatory Visit: Payer: BC Managed Care – PPO | Admitting: Cardiovascular Disease

## 2010-11-17 ENCOUNTER — Ambulatory Visit: Payer: BC Managed Care – PPO | Admitting: Cardiovascular Disease

## 2010-11-28 ENCOUNTER — Ambulatory Visit (INDEPENDENT_AMBULATORY_CARE_PROVIDER_SITE_OTHER): Payer: BC Managed Care – PPO | Admitting: Cardiovascular Disease

## 2010-11-28 ENCOUNTER — Encounter: Payer: Self-pay | Admitting: Cardiovascular Disease

## 2010-11-28 DIAGNOSIS — I4891 Unspecified atrial fibrillation: Secondary | ICD-10-CM

## 2010-11-28 MED ORDER — FLECAINIDE ACETATE 100 MG PO TABS: 100.0000 mg | ORAL_TABLET | Freq: Two times a day (BID) | ORAL | Status: DC

## 2010-11-28 NOTE — Assessment & Plan Note (Signed)
The frequency of atrial fibrillation and duration has improved since he was switched to flecainide. However, the episodes did not resolve and his atrial fibrillation still does not seem to be optimally controlled at this time. He is significantly symptomatic during these episodes. I will go ahead and increase the dose of flecainide to 100 mg twice daily. He can take an extra dose for breakthrough episodes. If he continues to have frequent episodes of atrial fibrillation in spite of this, then the next step would be an ablation procedure. Continue long-term anticoagulation with Pradaxa.

## 2010-11-28 NOTE — Patient Instructions (Signed)
Your physician recommends that you schedule a follow-up appointment in: 3 MONTHS  INCREASE FLECAINIDE TO 100 MG TWICE DAILY-MAY TAKE ONE EXTRA TABLET ONCE DAILY AS NEEDED FOR FAST HEART RATE

## 2010-11-28 NOTE — Progress Notes (Signed)
HPI  This is a 50 year old gentleman who is here today for a followup visit. He has history of paroxysmal atrial fibrillation. During his last visit, I switched him from Multaq to flecainide 50 mg twice daily. He was having prolonged episodes of atrial fibrillation with rapid ventricular response while he was on Multaq. Since then, his episodes of atrial fibrillation has decreased but did not resolve. Over the last month, he had about 10 episodes. Each lasted anywhere from 15 minutes to 4 hours. During these episodes, he complains of dyspnea palpitations. There has been no syncope or presyncope.  Allergies  Allergen Reactions  . Metformin     REACTION: pt states caused liver injury some years ago     Current Outpatient Prescriptions on File Prior to Visit  Medication Sig Dispense Refill  . dabigatran (PRADAXA) 150 MG CAPS Take 1 capsule (150 mg total) by mouth every 12 (twelve) hours.  60 capsule  6  . fish oil-omega-3 fatty acids 1000 MG capsule Take 2 g by mouth 2 (two) times daily.        . hydrochlorothiazide 25 MG tablet Take 1/2 tablet every day      . insulin aspart (NOVOLOG) 100 UNIT/ML injection Inject 20 Units into the skin 3 (three) times daily before meals.        . insulin glargine (LANTUS) 100 UNIT/ML injection Inject 20 Units into the skin at bedtime.       Marland Kitchen LORazepam (ATIVAN) 0.5 MG tablet Take 1 tablet by mouth At bedtime as needed.      . simvastatin (ZOCOR) 20 MG tablet 1/2 tablet daily at bedtime      . Tamsulosin HCl (FLOMAX) 0.4 MG CAPS Take 0.4 mg by mouth daily.       Marland Kitchen DISCONTD: flecainide (TAMBOCOR) 50 MG tablet Take 1 tablet (50 mg total) by mouth 2 (two) times daily.  60 tablet  2     Past Medical History  Diagnosis Date  . Diabetes mellitus     Type II dx 1999  . 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 Hyperlipidemia   . Hypertension   . Benign prostatic hypertrophy     s/p TUNA procedure aprx 2006,  . Carpal tunnel syndrome 06/17/2009  . HYPERSOMNIA  UNSPECIFIED 12/11/2007  . INSOMNIA-SLEEP DISORDER-UNSPEC 04/12/2010  . Atrial fibrillation 12/21/2009    paroxysmal. Failed Mutaq     Past Surgical History  Procedure Date  . Vasectomy     several years ago  . Prostate surgery     TUNA  . Cystoscopy 11-09    neg  @ urology, was rx Flomax     Family History  Problem Relation Age of Onset  . Heart attack Mother   . Heart disease Mother     CHF  . Diabetes Mother   . Hypertension Mother   . Heart disease Father     CHF  . Hypertension Father   . Cancer Neg Hx     Negative FH of Colon or Prostate Cancer     History   Social History  . Marital Status: Divorced    Spouse Name: N/A    Number of Children: 2  . Years of Education: N/A   Occupational History  .      works for [employer]   Social History Main Topics  . Smoking status: Former Smoker    Types: Cigarettes    Quit date: 06/26/1985  . Smokeless tobacco: Not on file  .  Alcohol Use: Yes     socially  . Drug Use: Yes  . Sexually Active: Not on file   Other Topics Concern  . Not on file   Social History Narrative   Separated from wife, [place] [employer] [place]Diet--much better, has lost some wt, feel greatExercise-very actice      PHYSICAL EXAM   BP 152/80  Pulse 62  Ht 5\' 11"  (1.803 m)  Wt 230 lb (104.327 kg)  BMI 32.08 kg/m2  SpO2 97%  Constitutional: He is oriented to person, place, and time. He appears well-developed and well-nourished. No distress.  HENT: No nasal discharge.  Head: Normocephalic and atraumatic.  Eyes: Pupils are equal, round, and reactive to light. Right eye exhibits no discharge. Left eye exhibits no discharge.  Neck: Normal range of motion. Neck supple. No JVD present. No thyromegaly present.  Cardiovascular: Normal rate, regular rhythm, normal heart sounds and intact distal pulses. Exam reveals no gallop and no friction rub.  No murmur heard.  Pulmonary/Chest: Effort normal and breath sounds  normal. No stridor. No respiratory distress. He has no wheezes. He has no rales. He exhibits no tenderness.  Abdominal: Soft. Bowel sounds are normal. He exhibits no distension. There is no tenderness. There is no rebound and no guarding.  Musculoskeletal: Normal range of motion. He exhibits no edema and no tenderness.  Neurological: He is alert and oriented to person, place, and time. Coordination normal.  Skin: Skin is warm and dry. No rash noted. He is not diaphoretic. No erythema. No pallor.  Psychiatric: He has a normal mood and affect. His behavior is normal. Judgment and thought content normal.      EKG: Normal sinus rhythm with no significant ST or T wave changes. Normal PR and QT interval. Left atrial enlargement.  ASSESSMENT AND PLAN

## 2010-12-12 ENCOUNTER — Encounter: Payer: Self-pay | Admitting: Cardiovascular Disease

## 2010-12-20 ENCOUNTER — Encounter: Payer: Self-pay | Admitting: Cardiovascular Disease

## 2011-01-04 ENCOUNTER — Other Ambulatory Visit: Payer: Self-pay | Admitting: Internal Medicine

## 2011-01-12 ENCOUNTER — Encounter: Payer: Self-pay | Admitting: Internal Medicine

## 2011-01-13 ENCOUNTER — Encounter: Payer: Self-pay | Admitting: Internal Medicine

## 2011-01-13 ENCOUNTER — Ambulatory Visit (INDEPENDENT_AMBULATORY_CARE_PROVIDER_SITE_OTHER): Payer: BC Managed Care – PPO | Admitting: Internal Medicine

## 2011-01-13 DIAGNOSIS — Z0000 Encounter for general adult medical examination without abnormal findings: Secondary | ICD-10-CM | POA: Insufficient documentation

## 2011-01-13 LAB — CBC WITH DIFFERENTIAL/PLATELET
Basophils Relative: 0.3 % (ref 0.0–3.0)
Eosinophils Relative: 4.4 % (ref 0.0–5.0)
HCT: 47.7 % (ref 39.0–52.0)
Monocytes Relative: 7.6 % (ref 3.0–12.0)
Neutrophils Relative %: 56.7 % (ref 43.0–77.0)
Platelets: 223 10*3/uL (ref 150.0–400.0)
RBC: 5.66 Mil/uL (ref 4.22–5.81)
WBC: 5.8 10*3/uL (ref 4.5–10.5)

## 2011-01-13 LAB — PSA: PSA: 0.44 ng/mL (ref 0.10–4.00)

## 2011-01-13 NOTE — Patient Instructions (Signed)
Hydrocortisone 1 % cream to legs

## 2011-01-13 NOTE — Progress Notes (Signed)
  Subjective:    Patient ID: Danny [name], male    DOB: 09-30-1960, 50 y.o.   MRN: [id number]  HPI CPX  Past Medical History  Diagnosis Date  . Diabetes mellitus     Type II dx 1999  . Hyperlipidemia   . Hypertension   . Benign prostatic hypertrophy     s/p TUNA procedure aprx 2006,  . Carpal tunnel syndrome 06/17/2009  . HYPERSOMNIA UNSPECIFIED 12/11/2007  . INSOMNIA-SLEEP DISORDER-UNSPEC 04/12/2010  . Atrial fibrillation 12/21/2009    paroxysmal. Failed Mutaq  . Allergic rhinitis   . History of cardiovascular stress test 8/11    Negative  . ED (erectile dysfunction)     After TUNA  . Elevated LFTs 2010    (-) chronic hep  panel   Past Surgical History  Procedure Date  . Vasectomy     several years ago  . Prostate surgery     TUNA  . Cystoscopy 11-09    neg  @ urology, was rx Flomax   Family History  Problem Relation Age of Onset  . Heart disease Mother     M-- MI,CHF  . Diabetes Mother   . Hypertension Mother   . Hypertension Father   . Cancer Neg Hx     Negative FH of Colon or Prostate Cancer      Social History: separated from wife, lives w/ girlfrined  3 children lives in [place]--quit ETOH-- used to drink socially, none in 3 years  Works for [employer] (state)  diet-- room for improvment exercise --   active    Review of Systems Has a  rash a day legs around the ankles, slightly itchy. Reports lower extremity edema at the end of the day, usually better after leg elevation. Denies chest pain, occasional shortness of breath only if he goes into A. fib  No dysuria or gross hematuria Ill defined abdominal pain around the umbilical area, no change with food, going on for a month, no nausea, vomiting, diarrhea or blood in the stools. Occasional tic bites, no recent one, no rash. Sometimes bites take long time to heal.     Objective:   Physical Exam  Constitutional: He appears well-developed and well-nourished. No distress.  HENT:  Head:  Normocephalic and atraumatic.  Neck: No thyromegaly present.       Carotid pulse normal  Cardiovascular: Normal rate.   No murmur heard. Pulmonary/Chest: Effort normal and breath sounds normal. No respiratory distress. He has no wheezes. He has no rales. He exhibits no tenderness.  Abdominal: Soft. Bowel sounds are normal. He exhibits no distension. There is no tenderness. There is no rebound and no guarding.       Supraumbilical ventral hernia noted, reducible  Genitourinary:       Small nontender prostate   Skin:       Few round scaly-dry-red areas around the ankles          Assessment & Plan:

## 2011-01-13 NOTE — Assessment & Plan Note (Addendum)
Td -- 2010 pneumonia shot-- today had a Cscope . Dr Chales Abrahams in [place] ?2009: 1 polyp (next?) labs  Diet-exercise discussed Other issues: see instructions

## 2011-01-14 LAB — BASIC METABOLIC PANEL WITH GFR
Calcium: 9.3 mg/dL (ref 8.4–10.5)
Chloride: 99 mEq/L (ref 96–112)
Creat: 0.93 mg/dL (ref 0.50–1.35)
GFR, Est Non African American: >60 mL/min (ref >60)
Sodium: 137 mEq/L (ref 135–145)

## 2011-01-18 ENCOUNTER — Telehealth: Payer: Self-pay | Admitting: *Deleted

## 2011-01-18 NOTE — Telephone Encounter (Signed)
Spoke w/ pt aware of labs.  

## 2011-01-18 NOTE — Telephone Encounter (Signed)
Message left for patient to return my call.  

## 2011-01-18 NOTE — Telephone Encounter (Signed)
Message copied by Leanne Lovely on Wed Jan 18, 2011  9:35 AM ------      Message from: [name] E      Created: Tue Jan 17, 2011  5:32 PM       Advise patient      PSA normal      Other labs within normal except for the sugar which is high. Needs to discuss with endocrinology.

## 2011-01-25 ENCOUNTER — Other Ambulatory Visit: Payer: Self-pay | Admitting: Internal Medicine

## 2011-01-26 NOTE — Telephone Encounter (Signed)
OK X 1 , NR 

## 2011-01-26 NOTE — Telephone Encounter (Signed)
Last OV- 01/13/11, last refill- 10/06/10.

## 2011-01-28 ENCOUNTER — Other Ambulatory Visit: Payer: Self-pay | Admitting: Internal Medicine

## 2011-01-30 NOTE — Telephone Encounter (Signed)
Ok 30, 5 RF 

## 2011-02-11 ENCOUNTER — Other Ambulatory Visit: Payer: Self-pay | Admitting: Endocrinology

## 2011-02-16 ENCOUNTER — Telehealth: Payer: Self-pay | Admitting: *Deleted

## 2011-02-16 NOTE — Telephone Encounter (Signed)
Per MD, pt is due for F/U OV. Appointment scheduled 03/01/2011

## 2011-03-01 ENCOUNTER — Ambulatory Visit (INDEPENDENT_AMBULATORY_CARE_PROVIDER_SITE_OTHER): Payer: BC Managed Care – PPO | Admitting: Endocrinology

## 2011-03-01 ENCOUNTER — Other Ambulatory Visit (INDEPENDENT_AMBULATORY_CARE_PROVIDER_SITE_OTHER): Payer: BC Managed Care – PPO

## 2011-03-01 ENCOUNTER — Encounter: Payer: Self-pay | Admitting: Endocrinology

## 2011-03-01 VITALS — BP 136/72 | HR 65 | Temp 98.2°F | Ht 71.0 in | Wt 234.0 lb

## 2011-03-01 DIAGNOSIS — E119 Type 2 diabetes mellitus without complications: Secondary | ICD-10-CM

## 2011-03-01 LAB — HEMOGLOBIN A1C: Hgb A1c MFr Bld: 8.3 % — ABNORMAL HIGH (ref 4.6–6.5)

## 2011-03-01 NOTE — Progress Notes (Signed)
Addended by: Romero Belling on: 03/01/2011 08:58 AM   Modules accepted: Orders

## 2011-03-01 NOTE — Progress Notes (Signed)
Subjective:    Patient ID: Danny [name], male    DOB: 07-08-1960, 50 y.o.   MRN: [id number]  HPI no cbg record, but states cbg's are seldom low.  He says it happens with exertion, which he is not able to anticipate.  pt states he feels well in general.  He says cbg is highest at hs, and in am. Past Medical History  Diagnosis Date  . Diabetes mellitus     Type II dx 1999  . Hyperlipidemia   . Hypertension   . Benign prostatic hypertrophy     s/p TUNA procedure aprx 2006,  . Carpal tunnel syndrome 06/17/2009  . HYPERSOMNIA UNSPECIFIED 12/11/2007  . INSOMNIA-SLEEP DISORDER-UNSPEC 04/12/2010  . Atrial fibrillation 12/21/2009    paroxysmal. Failed Mutaq  . Allergic rhinitis   . History of cardiovascular stress test 8/11    Negative  . ED (erectile dysfunction)     After TUNA  . Elevated LFTs 2010    (-) chronic hep  panel    Past Surgical History  Procedure Date  . Vasectomy     several years ago  . Prostate surgery     TUNA  . Cystoscopy 11-09    neg  @ urology, was rx Flomax    History   Social History  . Marital Status: Divorced    Spouse Name: N/A    Number of Children: 2  . Years of Education: N/A   Occupational History  .      works for [employer] (state)   Social History Main Topics  . Smoking status: Former Smoker    Types: Cigarettes    Quit date: 06/26/1985  . Smokeless tobacco: Not on file  . Alcohol Use: Yes     socially  . Drug Use: Yes  . Sexually Active: Not on file   Other Topics Concern  . Not on file   Social History Narrative   Separated from wife, has a girlfriend[place] [employer] [place])Diet--much better, has lost some wt, feel greatExercise-very actice    Current Outpatient Prescriptions on File Prior to Visit  Medication Sig Dispense Refill  . fish oil-omega-3 fatty acids 1000 MG capsule Take 2 g by mouth 2 (two) times daily.        . flecainide (TAMBOCOR) 100 MG tablet Take 1 tablet (100 mg total) by mouth 2 (two) times  daily.  80 tablet  6  . fluticasone (FLONASE) 50 MCG/ACT nasal spray Place into the nose as needed.        Marland Kitchen glucose blood test strip 1 each by Other route daily. Use as instructed       . hydrochlorothiazide 25 MG tablet Take 1/2 tablet every day      . insulin glargine (LANTUS) 100 UNIT/ML injection Inject 20 Units into the skin at bedtime.       . insulin lispro (HUMALOG KWIKPEN) 100 UNIT/ML injection 3x a day (just before each meal) 20-15-30 units      . LORazepam (ATIVAN) 0.5 MG tablet TAKE ONE-HALF TO ONE TABLET BY MOUTH AT BEDTIME AS NEEDED FOR INSOMNIA  30 tablet  5  . RELION PEN NEEDLE 31G/8MM 31G X 8 MM MISC USE AS DIRECTED FOUR TIMES A DAY.  500 each  1  . simvastatin (ZOCOR) 20 MG tablet 1/2 tablet daily at bedtime      . Tamsulosin HCl (FLOMAX) 0.4 MG CAPS TAKE ONE CAPSULE BY MOUTH EVERY DAY  30 capsule  1  Allergies  Allergen Reactions  . Metformin     REACTION: pt states caused liver injury some years ago    Family History  Problem Relation Age of Onset  . Heart disease Mother     M-- MI,CHF  . Diabetes Mother   . Hypertension Mother   . Hypertension Father   . Cancer Neg Hx     Negative FH of Colon or Prostate Cancer    BP 136/72  Pulse 65  Temp(Src) 98.2 F (36.8 C) (Oral)  Ht 5\' 11"  (1.803 m)  Wt 234 lb (106.142 kg)  BMI 32.64 kg/m2  SpO2 96%  Review of Systems Denies loc    Objective:   Physical Exam Pulses: dorsalis pedis intact bilat.   Feet: no deformity.  no ulcer on the feet.  feet are of normal color and temp.  no edema Neuro: sensation is intact to touch on the feet    Assessment & Plan:  Dm, needs increased rx

## 2011-03-01 NOTE — Patient Instructions (Addendum)
blood tests are being requested for you today.  please call [phone] to hear your test results.  You will be prompted to enter the 9-digit "MRN" number that appears at the top left of this page, followed by #.  Then you will hear the message. pending the test results, please continue lantus 20 units at bedtime, and: increase humalog to (just before each meal) 20-15-25 units if exercise is anticipated, take only 10 units of humalog.  however, if you are not able to anticipate the exercise, eat a light snack with the exercise.   Please schedule a follow-up appointment in 3 months. good diet and exercise habits significanly improve the control of your diabetes.  please let me know if you wish to be referred to a dietician.  high blood sugar is very risky to your health.  you should see an eye doctor every year. controlling your blood pressure and cholesterol drastically reduces the damage diabetes does to your body.  this also applies to quitting smoking.  please discuss these with your doctor.  you should take an aspirin every day, unless you have been advised by a doctor not to.

## 2011-03-02 ENCOUNTER — Encounter: Payer: Self-pay | Admitting: Cardiovascular Disease

## 2011-03-06 ENCOUNTER — Other Ambulatory Visit: Payer: Self-pay | Admitting: Internal Medicine

## 2011-03-06 ENCOUNTER — Encounter: Payer: Self-pay | Admitting: Cardiovascular Disease

## 2011-03-06 ENCOUNTER — Ambulatory Visit (INDEPENDENT_AMBULATORY_CARE_PROVIDER_SITE_OTHER): Payer: BC Managed Care – PPO | Admitting: Cardiovascular Disease

## 2011-03-06 DIAGNOSIS — E785 Hyperlipidemia, unspecified: Secondary | ICD-10-CM

## 2011-03-06 DIAGNOSIS — I4891 Unspecified atrial fibrillation: Secondary | ICD-10-CM

## 2011-03-06 DIAGNOSIS — I10 Essential (primary) hypertension: Secondary | ICD-10-CM

## 2011-03-06 NOTE — Assessment & Plan Note (Signed)
The patient has not had any further episodes of palpitations or tachycardia since I increased the dose of flecainide to 100 mg twice daily. Continue current treatment as well as long-term anticoagulation as his chads score is 2 due to hypertension and diabetes.

## 2011-03-06 NOTE — Assessment & Plan Note (Signed)
His blood pressure is borderline elevated. He really needs to increase his physical activity and try to lose some weight which was discussed with him today.

## 2011-03-06 NOTE — Progress Notes (Signed)
HPI  This is a 50 year old man who is here today for a followup visit. He has a history of symptomatic paroxysmal atrial fibrillation. He failed Multaq before and was switched a few months ago to flecainide. His symptoms were partially controlled with a dose of 50 mg twice a day. Thus, during his last visit I increased the dose to 100 mg twice daily. Since then, he has not had any single episode of palpitation or tachycardia. He feels very well. he denies any chest pain. He gained some weight. He has no history of coronary artery disease. He had a nuclear stress test in August of 2011 which overall was normal. The patient is on long-term anticoagulation with Pradaxa without any reported side effects.  Allergies  Allergen Reactions  . Metformin     REACTION: pt states caused liver injury some years ago     Current Outpatient Prescriptions on File Prior to Visit  Medication Sig Dispense Refill  . fish oil-omega-3 fatty acids 1000 MG capsule Take 2 g by mouth 2 (two) times daily.        . flecainide (TAMBOCOR) 100 MG tablet Take 1 tablet (100 mg total) by mouth 2 (two) times daily.  80 tablet  6  . fluticasone (FLONASE) 50 MCG/ACT nasal spray Place into the nose as needed.        Marland Kitchen glucose blood test strip 1 each by Other route daily. Use as instructed       . hydrochlorothiazide 25 MG tablet Take 1/2 tablet every day      . insulin glargine (LANTUS) 100 UNIT/ML injection Inject 20 Units into the skin at bedtime.       . insulin lispro (HUMALOG KWIKPEN) 100 UNIT/ML injection 3x a day (just before each meal) 20-15-30 units      . LORazepam (ATIVAN) 0.5 MG tablet TAKE ONE-HALF TO ONE TABLET BY MOUTH AT BEDTIME AS NEEDED FOR INSOMNIA  30 tablet  5  . ONETOUCH DELICA LANCETS MISC Check blood sugar daily dx 250.00       . RELION PEN NEEDLE 31G/8MM 31G X 8 MM MISC USE AS DIRECTED FOUR TIMES A DAY.  500 each  1  . simvastatin (ZOCOR) 20 MG tablet 1/2 tablet daily at bedtime      . Tamsulosin HCl  (FLOMAX) 0.4 MG CAPS TAKE ONE CAPSULE BY MOUTH EVERY DAY  30 capsule  1     Past Medical History  Diagnosis Date  . Diabetes mellitus     Type II dx 1999  . Hyperlipidemia   . Hypertension   . Benign prostatic hypertrophy     s/p TUNA procedure aprx 2006,  . Carpal tunnel syndrome 06/17/2009  . HYPERSOMNIA UNSPECIFIED 12/11/2007  . INSOMNIA-SLEEP DISORDER-UNSPEC 04/12/2010  . Allergic rhinitis   . History of cardiovascular stress test 8/11    Negative  . ED (erectile dysfunction)     After TUNA  . Elevated LFTs 2010    (-) chronic hep  panel  . Atrial fibrillation 12/21/2009    paroxysmal. Failed Multaq     Past Surgical History  Procedure Date  . Vasectomy     several years ago  . Prostate surgery     TUNA  . Cystoscopy 11-09    neg  @ urology, was rx Flomax     Family History  Problem Relation Age of Onset  . Heart disease Mother     M-- MI,CHF  . Diabetes Mother   . Hypertension Mother   .  Hypertension Father   . Cancer Neg Hx     Negative FH of Colon or Prostate Cancer     History   Social History  . Marital Status: Divorced    Spouse Name: N/A    Number of Children: 2  . Years of Education: N/A   Occupational History  .      works for [employer] (state)   Social History Main Topics  . Smoking status: Former Smoker    Types: Cigarettes    Quit date: 06/26/1985  . Smokeless tobacco: Not on file  . Alcohol Use: Yes     socially  . Drug Use: Yes  . Sexually Active: Not on file   Other Topics Concern  . Not on file   Social History Narrative   Separated from wife, has a girlfriendL[place] [employer] [place])Diet--much better, has lost some wt, feel greatExercise-very actice     PHYSICAL EXAM   BP 141/85  Pulse 68  Ht 5\' 11"  (1.803 m)  Wt 231 lb (104.781 kg)  BMI 32.22 kg/m2  SpO2 96%  Constitutional: He is oriented to person, place, and time. He appears well-developed and well-nourished. No distress.  HENT: No nasal  discharge.  Head: Normocephalic and atraumatic.  Eyes: Pupils are equal, round, and reactive to light. Right eye exhibits no discharge. Left eye exhibits no discharge.  Neck: Normal range of motion. Neck supple. No JVD present. No thyromegaly present.  Cardiovascular: Normal rate, regular rhythm, normal heart sounds and intact distal pulses. Exam reveals no gallop and no friction rub.  There is a 1/6 systolic ejection murmur in the aortic area.  Pulmonary/Chest: Effort normal and breath sounds normal. No stridor. No respiratory distress. He has no wheezes. He has no rales. He exhibits no tenderness.  Abdominal: Soft. Bowel sounds are normal. He exhibits no distension. There is no tenderness. There is no rebound and no guarding.  Musculoskeletal: Normal range of motion. He exhibits no edema and no tenderness.  Neurological: He is alert and oriented to person, place, and time. Coordination normal.  Skin: Skin is warm and dry. No rash noted. He is not diaphoretic. No erythema. No pallor.  Psychiatric: He has a normal mood and affect. His behavior is normal. Judgment and thought content normal.        ASSESSMENT AND PLAN

## 2011-03-06 NOTE — Patient Instructions (Signed)
Your physician recommends that you schedule a follow-up appointment in: 6 months in [place]   

## 2011-03-06 NOTE — Assessment & Plan Note (Signed)
His most recent lipid profile showed an LDL of 60 but his HDL was low. I discussed with him today the importance of regular aerobic exercises and weight loss.

## 2011-03-07 NOTE — Telephone Encounter (Signed)
Rx Done . 

## 2011-05-31 ENCOUNTER — Encounter: Payer: Self-pay | Admitting: Endocrinology

## 2011-05-31 ENCOUNTER — Other Ambulatory Visit (INDEPENDENT_AMBULATORY_CARE_PROVIDER_SITE_OTHER): Payer: BC Managed Care – PPO

## 2011-05-31 ENCOUNTER — Ambulatory Visit (INDEPENDENT_AMBULATORY_CARE_PROVIDER_SITE_OTHER): Payer: BC Managed Care – PPO | Admitting: Endocrinology

## 2011-05-31 VITALS — BP 118/68 | HR 52 | Temp 98.2°F | Ht 71.0 in | Wt 231.4 lb

## 2011-05-31 DIAGNOSIS — E119 Type 2 diabetes mellitus without complications: Secondary | ICD-10-CM

## 2011-05-31 MED ORDER — GLUCOSE BLOOD VI STRP: 1.0000 | ORAL_STRIP | Freq: Two times a day (BID) | Status: DC

## 2011-05-31 MED ORDER — INSULIN LISPRO 100 UNIT/ML ~~LOC~~ SOLN: SUBCUTANEOUS | Status: DC

## 2011-05-31 NOTE — Patient Instructions (Addendum)
blood tests are being requested for you today.  please call [phone] to hear your test results.  You will be prompted to enter the 9-digit "MRN" number that appears at the top left of this page, followed by #.  Then you will hear the message. pending the test results, please continue lantus 20 units at bedtime, and: increase humalog to (just before each meal) 20-20-25 units.  If you choose to eat snack at bedtime, take 5 units with it.  if exercise is anticipated, take only 10 units of humalog.  however, if you are not able to anticipate the exercise, eat a light snack with the exercise.   Please schedule a follow-up appointment in 3 months.  (update: i left message on phone-tree:  Increase lantus to 25-qhs also.)

## 2011-05-31 NOTE — Progress Notes (Signed)
Subjective:    Patient ID: Danny [name], male    DOB: Nov 16, 1960, 50 y.o.   MRN: [id number]  HPI Pt returns for f/u of insulin-requiring DM (1992).  pt states he feels well in general.  He takes humalog 20 units tid (qac), and lantus 20 units qhs.  no cbg record, but states cbg's vary from 110-236.  He says cbg is highest in am, due to hs-snack.  It is lowest before lunch, with activity.   Past Medical History  Diagnosis Date  . Diabetes mellitus     Type II dx 1999  . Hyperlipidemia   . Hypertension   . Benign prostatic hypertrophy     s/p TUNA procedure aprx 2006,  . Carpal tunnel syndrome 06/17/2009  . HYPERSOMNIA UNSPECIFIED 12/11/2007  . INSOMNIA-SLEEP DISORDER-UNSPEC 04/12/2010  . Allergic rhinitis   . History of cardiovascular stress test 8/11    Negative  . ED (erectile dysfunction)     After TUNA  . Elevated LFTs 2010    (-) chronic hep  panel  . Atrial fibrillation 12/21/2009    paroxysmal. Failed Multaq    Past Surgical History  Procedure Date  . Vasectomy     several years ago  . Prostate surgery     TUNA  . Cystoscopy 11-09    neg  @ urology, was rx Flomax    History   Social History  . Marital Status: Divorced    Spouse Name: N/A    Number of Children: 2  . Years of Education: N/A   Occupational History  .      works for [employer] (state)   Social History Main Topics  . Smoking status: Former Smoker    Types: Cigarettes    Quit date: 06/26/1985  . Smokeless tobacco: Not on file  . Alcohol Use: Yes     socially  . Drug Use: Yes  . Sexually Active: Not on file   Other Topics Concern  . Not on file   Social History Narrative   Separated from wife, [place] a girlfriend[place] [employer] [place]Diet--much better, has lost some wt, feel greatExercise-very actice    Current Outpatient Prescriptions on File Prior to Visit  Medication Sig Dispense Refill  . fish oil-omega-3 fatty acids 1000 MG capsule Take 2 g by mouth 2 (two) times  daily.        . flecainide (TAMBOCOR) 100 MG tablet Take 1 tablet (100 mg total) by mouth 2 (two) times daily.  80 tablet  6  . fluticasone (FLONASE) 50 MCG/ACT nasal spray Place into the nose as needed.        . hydrochlorothiazide 25 MG tablet Take 1/2 tablet every day      . insulin glargine (LANTUS) 100 UNIT/ML injection Inject 20 Units into the skin at bedtime.       Marland Kitchen LORazepam (ATIVAN) 0.5 MG tablet TAKE ONE-HALF TO ONE TABLET BY MOUTH AT BEDTIME AS NEEDED FOR INSOMNIA  30 tablet  5  . PRADAXA 150 MG CAPS Take 1 tablet by mouth Twice daily.      Marland Kitchen RELION PEN NEEDLE 31G/8MM 31G X 8 MM MISC USE AS DIRECTED FOUR TIMES A DAY.  500 each  1  . simvastatin (ZOCOR) 20 MG tablet 1/2 tablet daily at bedtime      . Tamsulosin HCl (FLOMAX) 0.4 MG CAPS TAKE ONE CAPSULE BY MOUTH EVERY DAY.  30 capsule  5    Allergies  Allergen Reactions  . Metformin  REACTION: pt states caused liver injury some years ago    Family History  Problem Relation Age of Onset  . Heart disease Mother     M-- MI,CHF  . Diabetes Mother   . Hypertension Mother   . Hypertension Father   . Cancer Neg Hx     Negative FH of Colon or Prostate Cancer    BP 118/68  Pulse 52  Temp(Src) 98.2 F (36.8 C) (Oral)  Ht 5\' 11"  (1.803 m)  Wt 231 lb 6.4 oz (104.962 kg)  BMI 32.27 kg/m2  SpO2 96%  Review of Systems denies hypoglycemia    Objective:   Physical Exam VITAL SIGNS:  See vs page GENERAL: no distress SKIN:  Insulin injection sites at the triceps areas are normal  Lab Results  Component Value Date   HGBA1C 8.4* 05/31/2011      Assessment & Plan:  DM, needs increased rx.

## 2011-06-05 ENCOUNTER — Other Ambulatory Visit: Payer: Self-pay | Admitting: *Deleted

## 2011-06-05 MED ORDER — ONETOUCH VERIO IQ SYSTEM W/DEVICE KIT: 1.0000 | PACK | Freq: Once | Status: DC

## 2011-06-05 NOTE — Telephone Encounter (Signed)
R'cd fax from [facility] for [facility]

## 2011-06-07 ENCOUNTER — Other Ambulatory Visit: Payer: Self-pay | Admitting: *Deleted

## 2011-06-07 MED ORDER — GLUCOSE BLOOD VI STRP: ORAL_STRIP | Status: DC

## 2011-06-07 MED ORDER — ONETOUCH DELICA LANCETS MISC: Status: DC

## 2011-06-07 NOTE — Telephone Encounter (Signed)
Rc'd fax from [facility] for refill of Lancets and test strips and dx code.

## 2011-06-16 ENCOUNTER — Other Ambulatory Visit: Payer: Self-pay | Admitting: Cardiovascular Disease

## 2011-06-16 ENCOUNTER — Other Ambulatory Visit: Payer: Self-pay | Admitting: Endocrinology

## 2011-06-22 ENCOUNTER — Other Ambulatory Visit: Payer: Self-pay | Admitting: *Deleted

## 2011-06-22 MED ORDER — DABIGATRAN ETEXILATE MESYLATE 150 MG PO CAPS: 150.0000 mg | ORAL_CAPSULE | Freq: Two times a day (BID) | ORAL | Status: DC

## 2011-07-06 ENCOUNTER — Other Ambulatory Visit: Payer: Self-pay | Admitting: Cardiology

## 2011-07-06 MED ORDER — FLECAINIDE ACETATE 100 MG PO TABS: 100.0000 mg | ORAL_TABLET | Freq: Two times a day (BID) | ORAL | Status: DC

## 2011-07-06 MED ORDER — DABIGATRAN ETEXILATE MESYLATE 150 MG PO CAPS: 150.0000 mg | ORAL_CAPSULE | Freq: Two times a day (BID) | ORAL | Status: DC

## 2011-07-06 NOTE — Telephone Encounter (Signed)
Requested Prescriptions   Pending Prescriptions Disp Refills  . flecainide (TAMBOCOR) 100 MG tablet 60 tablet 6    Sig: Take 1 tablet (100 mg total) by mouth 2 (two) times daily.   Signed Prescriptions Disp Refills  . dabigatran (PRADAXA) 150 MG CAPS 60 capsule 6    Sig: Take 1 capsule (150 mg total) by mouth every 12 (twelve) hours.    Authorizing Provider: Lewayne Bunting    Ordering User: Lacie Scotts

## 2011-07-06 NOTE — Telephone Encounter (Signed)
patient get pradaxa at [facility] in [place] and flecainide at [facility] in [place] please verify pharmacies

## 2011-07-06 NOTE — Telephone Encounter (Signed)
..   Requested Prescriptions   Pending Prescriptions Disp Refills  . flecainide (TAMBOCOR) 100 MG tablet 80 tablet 6    Sig: Take 1 tablet (100 mg total) by mouth 2 (two) times daily.  . dabigatran (PRADAXA) 150 MG CAPS 60 capsule 6    Sig: Take 1 capsule (150 mg total) by mouth every 12 (twelve) hours.

## 2011-08-01 ENCOUNTER — Encounter: Payer: Self-pay | Admitting: Cardiology

## 2011-08-01 ENCOUNTER — Ambulatory Visit (INDEPENDENT_AMBULATORY_CARE_PROVIDER_SITE_OTHER): Payer: BC Managed Care – PPO | Admitting: Cardiology

## 2011-08-01 ENCOUNTER — Other Ambulatory Visit: Payer: Self-pay | Admitting: Internal Medicine

## 2011-08-01 VITALS — BP 138/80 | HR 55 | Ht 71.0 in | Wt 237.0 lb

## 2011-08-01 DIAGNOSIS — I10 Essential (primary) hypertension: Secondary | ICD-10-CM

## 2011-08-01 DIAGNOSIS — I4891 Unspecified atrial fibrillation: Secondary | ICD-10-CM

## 2011-08-01 DIAGNOSIS — E785 Hyperlipidemia, unspecified: Secondary | ICD-10-CM

## 2011-08-01 LAB — CBC WITH DIFFERENTIAL/PLATELET
Basophils Absolute: 0 10*3/uL (ref 0.0–0.1)
Eosinophils Relative: 3.5 % (ref 0.0–5.0)
HCT: 46.1 % (ref 39.0–52.0)
Hemoglobin: 15.8 g/dL (ref 13.0–17.0)
Lymphocytes Relative: 29.4 % (ref 12.0–46.0)
Lymphs Abs: 2.2 10*3/uL (ref 0.7–4.0)
Monocytes Relative: 8.6 % (ref 3.0–12.0)
Neutro Abs: 4.3 10*3/uL (ref 1.4–7.7)
RBC: 5.49 Mil/uL (ref 4.22–5.81)
RDW: 13.5 % (ref 11.5–14.6)
WBC: 7.3 10*3/uL (ref 4.5–10.5)

## 2011-08-01 LAB — BASIC METABOLIC PANEL
CO2: 29 mEq/L (ref 19–32)
Chloride: 102 mEq/L (ref 96–112)
Creatinine, Ser: 0.8 mg/dL (ref 0.4–1.5)
Glucose, Bld: 149 mg/dL — ABNORMAL HIGH (ref 70–99)

## 2011-08-01 NOTE — Patient Instructions (Signed)
Your physician wants you to follow-up in: 6 months  You will receive a reminder letter in the mail two months in advance. If you don't receive a letter, please call our office to schedule the follow-up appointment.  Your physician recommends that you return for lab work in: today    

## 2011-08-01 NOTE — Assessment & Plan Note (Signed)
Blood pressure controlled. 

## 2011-08-01 NOTE — Progress Notes (Signed)
HPI: Pleasant gentleman previously followed by Dr Kirke Corin for fu of atrial fibrillation. He failed Multaq before and was switched previously to flecainide. Echocardiogram in June 2011 showed normal LV function, mild left atrial enlargement, mild mitral regurgitation and trace tricuspid regurgitation. He has no history of coronary artery disease. He had a nuclear stress test in August of 2011 which overall was normal, EF 69. Last seen in Sept 2012. Since then, he has had 3 episodes of atrial fibrillation. Each of these events were short lived. They last approximately one hour and resolve spontaneously. There is mild dyspnea and palpitations. He otherwise has not had dyspnea on exertion, orthopnea, PND or exertional chest pain.   Current Outpatient Prescriptions  Medication Sig Dispense Refill  . dabigatran (PRADAXA) 150 MG CAPS Take 1 capsule (150 mg total) by mouth every 12 (twelve) hours.  60 capsule  6  . fish oil-omega-3 fatty acids 1000 MG capsule Take 2 g by mouth 2 (two) times daily.        . flecainide (TAMBOCOR) 100 MG tablet Take 1 tablet (100 mg total) by mouth 2 (two) times daily.  60 tablet  6  . fluticasone (FLONASE) 50 MCG/ACT nasal spray Place into the nose as needed.        . hydrochlorothiazide 25 MG tablet Take 1/2 tablet every day      . insulin lispro (HUMALOG) 100 UNIT/ML injection 3x a day (just before each meal) 20-20-25 units, and 31g, 50 units syringes 4/day  30 mL  3  . LANTUS SOLOSTAR 100 UNIT/ML injection INJECT 35 UNITS AT BEDTIME  15 mL  10  . simvastatin (ZOCOR) 20 MG tablet 1/2 tablet daily at bedtime      . Tamsulosin HCl (FLOMAX) 0.4 MG CAPS TAKE ONE CAPSULE BY MOUTH EVERY DAY.  30 capsule  5  . Blood Glucose Monitoring Suppl (ONETOUCH VERIO IQ SYSTEM) W/DEVICE KIT 1 Device by Does not apply route once.  1 kit  0  . glucose blood (ONETOUCH VERIO IQ) test strip Use as directed twice daily to check blood sugar dx 250.00  100 each  11  . LORazepam (ATIVAN) 0.5 MG  tablet TAKE ONE-HALF TO ONE TABLET BY MOUTH AT BEDTIME AS NEEDED FOR INSOMNIA  30 tablet  5  . ONETOUCH DELICA LANCETS MISC Use as directed twice daily dx 250.00  100 each  11  . PROCTOSOL HC 2.5 % rectal cream 2.5 %.      . RELION PEN NEEDLE 31G/8MM 31G X 8 MM MISC USE AS DIRECTED FOUR TIMES A DAY.  500 each  1     Past Medical History  Diagnosis Date  . Diabetes mellitus     Type II dx 1999  . Hyperlipidemia   . Hypertension   . Benign prostatic hypertrophy     s/p TUNA procedure aprx 2006,  . Carpal tunnel syndrome 06/17/2009  . HYPERSOMNIA UNSPECIFIED 12/11/2007  . INSOMNIA-SLEEP DISORDER-UNSPEC 04/12/2010  . Allergic rhinitis   . History of cardiovascular stress test 8/11    Negative  . ED (erectile dysfunction)     After TUNA  . Elevated LFTs 2010    (-) chronic hep  panel  . Atrial fibrillation 12/21/2009    paroxysmal. Failed Multaq    Past Surgical History  Procedure Date  . Vasectomy     several years ago  . Prostate surgery     TUNA  . Cystoscopy 11-09    neg  @ urology, was rx Flomax  History   Social History  . Marital Status: Divorced    Spouse Name: N/A    Number of Children: 2  . Years of Education: N/A   Occupational History  .      works for [employer] (state)   Social History Main Topics  . Smoking status: Former Smoker    Types: Cigarettes    Quit date: 06/26/1985  . Smokeless tobacco: Not on file  . Alcohol Use: Yes     socially  . Drug Use: Yes  . Sexually Active: Not on file   Other Topics Concern  . Not on file   Social History Narrative   Separated from wife, has a girlfriendLives in [place] [employer] [place]Diet--much better, has lost some wt, feel greatExercise-very actice    ROS: no fevers or chills, productive cough, hemoptysis, dysphasia, odynophagia, melena, hematochezia, dysuria, hematuria, rash, seizure activity, orthopnea, PND, pedal edema, claudication. Remaining systems are negative.  Physical  Exam: Well-developed well-nourished in no acute distress.  Skin is warm and dry.  HEENT is normal.  Neck is supple. No thyromegaly.  Chest is clear to auscultation with normal expansion.  Cardiovascular exam is regular rate and rhythm.  Abdominal exam nontender or distended. No masses palpated. Extremities show no edema. neuro grossly intact  ECG sinus rhythm at a rate of 55. No ST changes.

## 2011-08-01 NOTE — Assessment & Plan Note (Signed)
Management per primary care. 

## 2011-08-01 NOTE — Assessment & Plan Note (Signed)
Patient in sinus rhythm today. He has had 3 brief episodes of atrial fibrillation since last evaluation. I discussed the possibility of ablation. Note he has failed Multaq in the past. However he feels symptoms are reasonably well controlled at this point. Continue flecainide. Consider referral for atrial fibrillation ablation in the future if episodes become more frequent. Continue pradaxa as he has embolic risk factors of hypertension and diabetes mellitus. Check hemoglobin and renal function.

## 2011-08-02 NOTE — Telephone Encounter (Signed)
Refill done.  

## 2011-08-08 ENCOUNTER — Telehealth: Payer: Self-pay | Admitting: Cardiology

## 2011-08-08 MED ORDER — FLECAINIDE ACETATE 100 MG PO TABS: 100.0000 mg | ORAL_TABLET | Freq: Two times a day (BID) | ORAL | Status: DC

## 2011-08-08 NOTE — Telephone Encounter (Signed)
New Problem:     Patient called in because Dr. Jens Som reduced his flecainide (TAMBOCOR) 100 MG tablet prescription form 80 tablets to 60 and he would need a refill to keep from running out.  The patient said that he sometimes takes a pill and a half to meet his daily dosage and will run out before his prescription is due.  Please call back.

## 2011-08-08 NOTE — Telephone Encounter (Signed)
Left message for pt refill complete

## 2011-08-22 ENCOUNTER — Other Ambulatory Visit: Payer: Self-pay | Admitting: Endocrinology

## 2011-08-30 ENCOUNTER — Ambulatory Visit (INDEPENDENT_AMBULATORY_CARE_PROVIDER_SITE_OTHER): Payer: BC Managed Care – PPO | Admitting: Endocrinology

## 2011-08-30 ENCOUNTER — Encounter: Payer: Self-pay | Admitting: Endocrinology

## 2011-08-30 ENCOUNTER — Other Ambulatory Visit (INDEPENDENT_AMBULATORY_CARE_PROVIDER_SITE_OTHER): Payer: BC Managed Care – PPO

## 2011-08-30 VITALS — BP 130/72 | HR 67 | Temp 97.1°F | Ht 71.0 in | Wt 239.7 lb

## 2011-08-30 DIAGNOSIS — E119 Type 2 diabetes mellitus without complications: Secondary | ICD-10-CM

## 2011-08-30 MED ORDER — CEFUROXIME AXETIL 250 MG PO TABS: 250.0000 mg | ORAL_TABLET | Freq: Two times a day (BID) | ORAL | Status: AC

## 2011-08-30 NOTE — Patient Instructions (Addendum)
blood tests are being requested for you today.  please call [phone] to hear your test results.  You will be prompted to enter the 9-digit "MRN" number that appears at the top left of this page, followed by #.  Then you will hear the message.   pending the test results, please continue the same insulin for now.   Please come back for a follow-up appointment in 3 months.   i have sent a prescription to your pharmacy, for an antibiotic. (update: i left message on phone-tree:  Call back and tell us what time of day cbg is highest, so we can increase)

## 2011-08-30 NOTE — Progress Notes (Signed)
Subjective:    Patient ID: Danny [name], male    DOB: 26-Dec-1960, 50 y.o.   MRN: [id number]  HPI Pt returns for f/u of insulin-requiring DM (1992).  He says he has been ill with URI recently, so he has had to reduce insulin.  Prior to the illness, he says cbg's were well-controlled.  no cbg record.   Pt states 1 month of slight prod-quality cough in the chest, and assoc nasal congestion.   Past Medical History  Diagnosis Date  . Diabetes mellitus     Type II dx 1999  . Hyperlipidemia   . Hypertension   . Benign prostatic hypertrophy     s/p TUNA procedure aprx 2006,  . Carpal tunnel syndrome 06/17/2009  . HYPERSOMNIA UNSPECIFIED 12/11/2007  . INSOMNIA-SLEEP DISORDER-UNSPEC 04/12/2010  . Allergic rhinitis   . History of cardiovascular stress test 8/11    Negative  . ED (erectile dysfunction)     After TUNA  . Elevated LFTs 2010    (-) chronic hep  panel  . Atrial fibrillation 12/21/2009    paroxysmal. Failed Multaq    Past Surgical History  Procedure Date  . Vasectomy     several years ago  . Prostate surgery     TUNA  . Cystoscopy 11-09    neg  @ urology, was rx Flomax    History   Social History  . Marital Status: Divorced    Spouse Name: N/A    Number of Children: 2  . Years of Education: N/A   Occupational History  .      works for [employer] (state)   Social History Main Topics  . Smoking status: Former Smoker    Types: Cigarettes    Quit date: 06/26/1985  . Smokeless tobacco: Not on file  . Alcohol Use: Yes     socially  . Drug Use: Yes  . Sexually Active: Not on file   Other Topics Concern  . Not on file   Social History Narrative   Separated from wife, has a girlfriendLives in [place] [employer] [place])Diet--much better, has lost some wt, feel greatExercise-very actice    Current Outpatient Prescriptions on File Prior to Visit  Medication Sig Dispense Refill  . Blood Glucose Monitoring Suppl (ONETOUCH VERIO IQ SYSTEM) W/DEVICE KIT 1 Device  by Does not apply route once.  1 kit  0  . dabigatran (PRADAXA) 150 MG CAPS Take 1 capsule (150 mg total) by mouth every 12 (twelve) hours.  60 capsule  6  . fish oil-omega-3 fatty acids 1000 MG capsule Take 2 g by mouth 2 (two) times daily.        . flecainide (TAMBOCOR) 100 MG tablet Take 1 tablet (100 mg total) by mouth 2 (two) times daily.  90 tablet  12  . fluticasone (FLONASE) 50 MCG/ACT nasal spray Place into the nose as needed.        Marland Kitchen glucose blood (ONETOUCH VERIO IQ) test strip Use as directed twice daily to check blood sugar dx 250.00  100 each  11  . hydrochlorothiazide (HYDRODIURIL) 25 MG tablet TAKE ONE TABLET BY MOUTH EVERY DAY  100 tablet  1  . insulin lispro (HUMALOG) 100 UNIT/ML injection 3x a day (just before each meal) 20-20-25 units, and 31g, 50 units syringes 4/day  30 mL  3  . LANTUS SOLOSTAR 100 UNIT/ML injection INJECT 35 UNITS AT BEDTIME  15 mL  10  . LORazepam (ATIVAN) 0.5 MG tablet TAKE ONE-HALF TO ONE  TABLET BY MOUTH AT BEDTIME AS NEEDED FOR INSOMNIA  30 tablet  5  . ONETOUCH DELICA LANCETS MISC Use as directed twice daily dx 250.00  100 each  11  . PROCTOSOL HC 2.5 % rectal cream 2.5 %.      . RELION PEN NEEDLE 31G/8MM 31G X 8 MM MISC USE AS DIRECTED FOUR TIMES A DAY.  500 each  1  . simvastatin (ZOCOR) 20 MG tablet 1/2 tablet daily at bedtime      . Tamsulosin HCl (FLOMAX) 0.4 MG CAPS TAKE ONE CAPSULE BY MOUTH EVERY DAY.  30 capsule  5    Allergies  Allergen Reactions  . Metformin     REACTION: pt states caused liver injury some years ago    Family History  Problem Relation Age of Onset  . Heart disease Mother     M-- MI,CHF  . Diabetes Mother   . Hypertension Mother   . Hypertension Father   . Cancer Neg Hx     Negative FH of Colon or Prostate Cancer    BP 130/72  Pulse 67  Temp(Src) 97.1 F (36.2 C) (Oral)  Ht 5\' 11"  (1.803 m)  Wt 239 lb 11.2 oz (108.727 kg)  BMI 33.43 kg/m2  SpO2 97%   Review of Systems Denies weight change and fever      Objective:   Physical Exam VITAL SIGNS:  See vs page.  GENERAL: no distress. head: no deformity eyes: no periorbital swelling, no proptosis external nose and ears are normal mouth: no lesion seen Both eac's and tm's are normal LUNGS:  Clear to auscultation Pulses: dorsalis pedis intact bilat.   Feet: no deformity.  no ulcer on the feet.  feet are of normal color and temp.  no edema.   Neuro: sensation is intact to touch on the feet.     Lab Results  Component Value Date   HGBA1C 8.2* 08/30/2011      Assessment & Plan:  Glenford Peers, new DM, needs increased rx

## 2011-09-05 ENCOUNTER — Other Ambulatory Visit: Payer: Self-pay | Admitting: Internal Medicine

## 2011-09-25 ENCOUNTER — Other Ambulatory Visit: Payer: Self-pay | Admitting: Endocrinology

## 2011-10-05 ENCOUNTER — Other Ambulatory Visit: Payer: Self-pay | Admitting: Internal Medicine

## 2011-10-05 NOTE — Telephone Encounter (Signed)
Refill done.  

## 2011-10-06 ENCOUNTER — Other Ambulatory Visit: Payer: Self-pay | Admitting: Endocrinology

## 2011-11-03 ENCOUNTER — Other Ambulatory Visit: Payer: Self-pay | Admitting: Internal Medicine

## 2011-11-03 NOTE — Telephone Encounter (Signed)
Refill done.  

## 2011-11-30 ENCOUNTER — Encounter: Payer: Self-pay | Admitting: Endocrinology

## 2011-11-30 ENCOUNTER — Ambulatory Visit (INDEPENDENT_AMBULATORY_CARE_PROVIDER_SITE_OTHER): Payer: BC Managed Care – PPO | Admitting: Endocrinology

## 2011-11-30 VITALS — BP 118/74 | HR 94 | Temp 97.6°F | Ht 71.0 in | Wt 235.0 lb

## 2011-11-30 NOTE — Progress Notes (Signed)
Subjective:    Patient ID: Danny [name], male    DOB: 01/11/1961, 51 y.o.   MRN: [id number]  HPI Pt returns for f/u of insulin-requiring DM (dx'ed 1992; no known complications).  no cbg record, but states cbg's are 150-200.  He has not been taking his lantus, due to the cost. Past Medical History  Diagnosis Date  . Diabetes mellitus     Type II dx 1999  . Hyperlipidemia   . Hypertension   . Benign prostatic hypertrophy     s/p TUNA procedure aprx 2006,  . Carpal tunnel syndrome 06/17/2009  . HYPERSOMNIA UNSPECIFIED 12/11/2007  . INSOMNIA-SLEEP DISORDER-UNSPEC 04/12/2010  . Allergic rhinitis   . History of cardiovascular stress test 8/11    Negative  . ED (erectile dysfunction)     After TUNA  . Elevated LFTs 2010    (-) chronic hep  panel  . Atrial fibrillation 12/21/2009    paroxysmal. Failed Multaq    Past Surgical History  Procedure Date  . Vasectomy     several years ago  . Prostate surgery     TUNA  . Cystoscopy 11-09    neg  @ urology, was rx Flomax    History   Social History  . Marital Status: Divorced    Spouse Name: N/A    Number of Children: 2  . Years of Education: N/A   Occupational History  .      works for [employer] (state)   Social History Main Topics  . Smoking status: Former Smoker    Types: Cigarettes    Quit date: 06/26/1985  . Smokeless tobacco: Not on file  . Alcohol Use: Yes     socially  . Drug Use: Yes  . Sexually Active: Not on file   Other Topics Concern  . Not on file   Social History Narrative   Separated from wife, has a girlfriendLives [place] [employer] [place]Diet--much better, has lost some wt, feel greatExercise-very actice    Current Outpatient Prescriptions on File Prior to Visit  Medication Sig Dispense Refill  . Blood Glucose Monitoring Suppl (ONETOUCH VERIO IQ SYSTEM) W/DEVICE KIT 1 Device by Does not apply route once.  1 kit  0  . dabigatran (PRADAXA) 150 MG CAPS Take 1 capsule (150 mg total) by mouth  every 12 (twelve) hours.  60 capsule  6  . fish oil-omega-3 fatty acids 1000 MG capsule Take 2 g by mouth 2 (two) times daily.        . flecainide (TAMBOCOR) 100 MG tablet Take 1 tablet (100 mg total) by mouth 2 (two) times daily.  90 tablet  12  . fluticasone (FLONASE) 50 MCG/ACT nasal spray Place into the nose as needed.        Marland Kitchen glucose blood (ONETOUCH VERIO IQ) test strip Use as directed twice daily to check blood sugar dx 250.00  100 each  11  . HUMALOG KWIKPEN 100 UNIT/ML injection INJECT 20 UNITS THREE TIMES A DAY(JUST BEFORE EACH MEAL.)  15 mL  5  . hydrochlorothiazide (HYDRODIURIL) 25 MG tablet TAKE ONE TABLET BY MOUTH EVERY DAY  100 tablet  1  . insulin NPH (HUMULIN N,NOVOLIN N) 100 UNIT/ML injection Inject 30 Units into the skin at bedtime.      Marland Kitchen LORazepam (ATIVAN) 0.5 MG tablet TAKE ONE-HALF TO ONE TABLET BY MOUTH AT BEDTIME AS NEEDED FOR INSOMNIA  30 tablet  5  . ONETOUCH DELICA LANCETS MISC Use as directed twice daily dx  250.00  100 each  11  . PROCTOSOL HC 2.5 % rectal cream 2.5 %.      . RELION PEN NEEDLE 31G/8MM 31G X 8 MM MISC USE AS DIRECTED FOUR TIMES A DAY.  500 each  1  . simvastatin (ZOCOR) 20 MG tablet TAKE ONE TABLET BY MOUTH EVERY NIGHT AT BEDTIME  90 tablet  2  . Tamsulosin HCl (FLOMAX) 0.4 MG CAPS TAKE ONE CAPSULE BY MOUTH EVERY DAY  30 capsule  1    Allergies  Allergen Reactions  . Metformin     REACTION: pt states caused liver injury some years ago    Family History  Problem Relation Age of Onset  . Heart disease Mother     M-- MI,CHF  . Diabetes Mother   . Hypertension Mother   . Hypertension Father   . Cancer Neg Hx     Negative FH of Colon or Prostate Cancer    BP 118/74  Pulse 94  Temp 97.6 F (36.4 C) (Oral)  Ht 5\' 11"  (1.803 m)  Wt 235 lb (106.595 kg)  BMI 32.78 kg/m2  SpO2 96%   Review of Systems denies hypoglycemia    Objective:   Physical Exam VITAL SIGNS:  See vs page GENERAL: no distress SKIN:  Insulin injection sites at the  anterior thighs are normal.     Lab Results  Component Value Date   HGBA1C 8.2* 08/30/2011      Assessment & Plan:  DM.  therapy limited by noncompliance.  i'll do the best i can.

## 2011-11-30 NOTE — Patient Instructions (Addendum)
check your blood sugar 2 times a day.  vary the time of day when you check, between before the 3 meals, and at bedtime.  also check if you have symptoms of your blood sugar being too high or too low.  please keep a record of the readings and bring it to your next appointment here.  please call us sooner if your blood sugar goes below 70, or if it stays over 200.   Please come back for a follow-up appointment in 3 months.   Change lantus to NPH, 30 units at bedtime.

## 2012-01-04 ENCOUNTER — Other Ambulatory Visit: Payer: Self-pay | Admitting: Internal Medicine

## 2012-01-04 NOTE — Telephone Encounter (Signed)
Refill done.  

## 2012-02-01 ENCOUNTER — Other Ambulatory Visit: Payer: Self-pay | Admitting: Internal Medicine

## 2012-02-02 NOTE — Telephone Encounter (Signed)
Refill done.  

## 2012-02-17 ENCOUNTER — Other Ambulatory Visit: Payer: Self-pay | Admitting: Cardiology

## 2012-02-29 ENCOUNTER — Ambulatory Visit: Payer: BC Managed Care – PPO | Admitting: Endocrinology

## 2012-03-08 ENCOUNTER — Ambulatory Visit (INDEPENDENT_AMBULATORY_CARE_PROVIDER_SITE_OTHER): Payer: BC Managed Care – PPO | Admitting: Endocrinology

## 2012-03-08 ENCOUNTER — Encounter: Payer: Self-pay | Admitting: Endocrinology

## 2012-03-08 ENCOUNTER — Other Ambulatory Visit: Payer: Self-pay | Admitting: General Practice

## 2012-03-08 ENCOUNTER — Other Ambulatory Visit (INDEPENDENT_AMBULATORY_CARE_PROVIDER_SITE_OTHER): Payer: BC Managed Care – PPO

## 2012-03-08 ENCOUNTER — Other Ambulatory Visit: Payer: Self-pay | Admitting: Internal Medicine

## 2012-03-08 ENCOUNTER — Other Ambulatory Visit: Payer: Self-pay | Admitting: *Deleted

## 2012-03-08 VITALS — BP 120/70 | HR 60 | Temp 97.2°F | Wt 228.0 lb

## 2012-03-08 DIAGNOSIS — IMO0001 Reserved for inherently not codable concepts without codable children: Secondary | ICD-10-CM

## 2012-03-08 LAB — HEMOGLOBIN A1C: Hgb A1c MFr Bld: 7.5 % — ABNORMAL HIGH (ref 4.6–6.5)

## 2012-03-08 MED ORDER — INSULIN LISPRO 100 UNIT/ML ~~LOC~~ SOLN: SUBCUTANEOUS | Status: DC

## 2012-03-08 MED ORDER — TAMSULOSIN HCL 0.4 MG PO CAPS: 0.4000 mg | ORAL_CAPSULE | Freq: Every day | ORAL | Status: DC

## 2012-03-08 NOTE — Progress Notes (Signed)
Subjective:    Patient ID: Danny [name], male    DOB: [birth year], 51 y.o.   MRN: [id number]  HPI Pt returns for f/u of insulin-requiring DM (dx'ed 1992; no known complications).  no cbg record, but states cbg's are 150-200.  He had to reduce the nph to 25 units qhs, due to early am hypoglycemia.  It is highest at hs.  Past Medical History  Diagnosis Date  . Diabetes mellitus     Type II dx 1999  . Hyperlipidemia   . Hypertension   . Benign prostatic hypertrophy     s/p TUNA procedure aprx 2006,  . Carpal tunnel syndrome 06/17/2009  . HYPERSOMNIA UNSPECIFIED 12/11/2007  . INSOMNIA-SLEEP DISORDER-UNSPEC 04/12/2010  . Allergic rhinitis   . History of cardiovascular stress test 8/11    Negative  . ED (erectile dysfunction)     After TUNA  . Elevated LFTs 2010    (-) chronic hep  panel  . Atrial fibrillation 12/21/2009    paroxysmal. Failed Multaq    Past Surgical History  Procedure Date  . Vasectomy     several years ago  . Prostate surgery     TUNA  . Cystoscopy 11-09    neg  @ urology, was rx Flomax    History   Social History  . Marital Status: Divorced    Spouse Name: N/A    Number of Children: 2  . Years of Education: N/A   Occupational History  .      works for [employer] (state)   Social History Main Topics  . Smoking status: Former Smoker    Types: Cigarettes    Quit date: 06/26/1985  . Smokeless tobacco: Not on file  . Alcohol Use: Yes     socially  . Drug Use: Yes  . Sexually Active: Not on file   Other Topics Concern  . Not on file   Social History Narrative   Separated from wife, [place] [employer] [place]Diet--much better, has lost some wt, feel greatExercise-very actice    Current Outpatient Prescriptions on File Prior to Visit  Medication Sig Dispense Refill  . Blood Glucose Monitoring Suppl (ONETOUCH VERIO IQ SYSTEM) W/DEVICE KIT 1 Device by Does not apply route once.  1 kit  0  . fish oil-omega-3 fatty acids  1000 MG capsule Take 2 g by mouth 2 (two) times daily.        . flecainide (TAMBOCOR) 100 MG tablet Take 1 tablet (100 mg total) by mouth 2 (two) times daily.  90 tablet  12  . fluticasone (FLONASE) 50 MCG/ACT nasal spray Place into the nose as needed.        Marland Kitchen glucose blood (ONETOUCH VERIO IQ) test strip Use as directed twice daily to check blood sugar dx 250.00  100 each  11  . hydrochlorothiazide (HYDRODIURIL) 25 MG tablet TAKE ONE TABLET BY MOUTH EVERY DAY  100 tablet  1  . insulin NPH (HUMULIN N,NOVOLIN N) 100 UNIT/ML injection Inject 25 Units into the skin at bedtime.       Letta Pate DELICA LANCETS MISC Use as directed twice daily dx 250.00  100 each  11  . PRADAXA 150 MG CAPS TAKE ONE CAPSULE BY MOUTH EVERY 12 HOURS  60 capsule  0  . PROCTOSOL HC 2.5 % rectal cream 2.5 %.      . RELION PEN NEEDLE 31G/8MM 31G X 8 MM MISC USE AS DIRECTED FOUR TIMES A DAY.  500  each  1  . simvastatin (ZOCOR) 20 MG tablet TAKE ONE TABLET BY MOUTH EVERY NIGHT AT BEDTIME  90 tablet  2  . insulin lispro (HUMALOG KWIKPEN) 100 UNIT/ML injection Inject three times a day (just before each meal) 20-20-25 units  15 mL  0  . LORazepam (ATIVAN) 0.5 MG tablet TAKE ONE-HALF TO ONE TABLET BY MOUTH AT BEDTIME AS NEEDED FOR INSOMNIA  30 tablet  5    Allergies  Allergen Reactions  . Metformin     REACTION: pt states caused liver injury some years ago    Family History  Problem Relation Age of Onset  . Heart disease Mother     M-- MI,CHF  . Diabetes Mother   . Hypertension Mother   . Hypertension Father   . Cancer Neg Hx     Negative FH of Colon or Prostate Cancer    BP 120/70  Pulse 60  Temp 97.2 F (36.2 C) (Oral)  Wt 228 lb (103.42 kg)  SpO2 97%  Review of Systems Denies LOC    Objective:   Physical Exam Pulses: dorsalis pedis intact bilat.   Feet: no deformity.  no ulcer on the feet.  feet are of normal color and temp.  no edema Neuro: sensation is intact to touch on the feet   Lab Results    Component Value Date   HGBA1C 7.5* 03/08/2012      Assessment & Plan:  DM: needs increased rx

## 2012-03-08 NOTE — Patient Instructions (Addendum)
check your blood sugar 2 times a day.  vary the time of day when you check, between before the 3 meals, and at bedtime.  also check if you have symptoms of your blood sugar being too high or too low.  please keep a record of the readings and bring it to your next appointment here.  please call us sooner if your blood sugar goes below 70, or if it stays over 200.   Please come back for a follow-up appointment in 4 months.   Continue NPH, 25 units at bedtime.   blood tests are being requested for you today.  You will receive a letter with results.

## 2012-03-18 ENCOUNTER — Telehealth: Payer: Self-pay | Admitting: Cardiology

## 2012-03-18 NOTE — Telephone Encounter (Signed)
New Problem:     I called the patient and was unable to reach them. I left a message on their voicemail with my name, the reason I called, the name of his physician, and a number to call back to schedule their appointment. 

## 2012-03-19 ENCOUNTER — Other Ambulatory Visit: Payer: Self-pay | Admitting: Cardiology

## 2012-03-29 ENCOUNTER — Ambulatory Visit (INDEPENDENT_AMBULATORY_CARE_PROVIDER_SITE_OTHER): Payer: BC Managed Care – PPO | Admitting: Internal Medicine

## 2012-03-29 ENCOUNTER — Encounter: Payer: Self-pay | Admitting: *Deleted

## 2012-03-29 VITALS — BP 128/72 | HR 48 | Temp 97.8°F | Wt 227.0 lb

## 2012-03-29 DIAGNOSIS — Z0000 Encounter for general adult medical examination without abnormal findings: Secondary | ICD-10-CM

## 2012-03-29 DIAGNOSIS — I10 Essential (primary) hypertension: Secondary | ICD-10-CM

## 2012-03-29 DIAGNOSIS — E785 Hyperlipidemia, unspecified: Secondary | ICD-10-CM

## 2012-03-29 LAB — COMPREHENSIVE METABOLIC PANEL
AST: 30 U/L (ref 0–37)
BUN: 16 mg/dL (ref 6–23)
Calcium: 9 mg/dL (ref 8.4–10.5)
Chloride: 103 mEq/L (ref 96–112)
Creatinine, Ser: 0.9 mg/dL (ref 0.4–1.5)
GFR: 96.21 mL/min (ref >60.00)

## 2012-03-29 LAB — LIPID PANEL
Cholesterol: 120 mg/dL (ref 0–200)
HDL: 30 mg/dL — ABNORMAL LOW (ref >39.00)
Total CHOL/HDL Ratio: 4
Triglycerides: 114 mg/dL (ref 0.0–149.0)

## 2012-03-29 LAB — C-REACTIVE PROTEIN: CRP: <0.5 mg/dL (ref 0.5–20.0)

## 2012-03-29 MED ORDER — TAMSULOSIN HCL 0.4 MG PO CAPS: 0.4000 mg | ORAL_CAPSULE | Freq: Every day | ORAL | Status: DC

## 2012-03-29 NOTE — Assessment & Plan Note (Addendum)
Tdap 2012 Had a flu shot already per pt  pneumonia shot 2012 (not documented in the immunization list) Had cscopes w/  Dr Chales Abrahams in [place] before, saw him 07-2011, was rec a Cscope but couldn't do it, cost is an issue; today reports occ lower abd discomfort but no N -V-D or blood in stools, rec observation for now labs  Diet-exercise discussed

## 2012-03-29 NOTE — Assessment & Plan Note (Addendum)
On Simvastatin, was recommended to take half tablet instead of one tablet per cardiology Dr.Arida, med list corrected

## 2012-03-29 NOTE — Patient Instructions (Addendum)
Please see the heart doctor at your earliest convenience

## 2012-03-29 NOTE — Progress Notes (Signed)
  Subjective:    Patient ID: Danny [name], male    DOB: 08/26/1960, 51 y.o.   MRN: [id number]  HPI CPX  Past Medical History  Diagnosis Date  . Diabetes mellitus     Type II dx 1999  . Hyperlipidemia   . Hypertension   . Benign prostatic hypertrophy     s/p TUNA procedure aprx 2006,  . Carpal tunnel syndrome 06/17/2009  . HYPERSOMNIA UNSPECIFIED 12/11/2007  . INSOMNIA-SLEEP DISORDER-UNSPEC 04/12/2010  . Allergic rhinitis   . History of cardiovascular stress test 8/11    Negative  . ED (erectile dysfunction)     After TUNA  . Elevated LFTs 2010    (-) chronic hep  panel  . Atrial fibrillation 12/21/2009    paroxysmal. Failed Multaq   Past Surgical History  Procedure Date  . Vasectomy     several years ago  . Prostate surgery     TUNA  . Cystoscopy 11-09    neg  @ urology, was rx Flomax   Family History  Problem Relation Age of Onset  . Heart disease Mother     M-- MI,CHF  . Diabetes Mother   . Hypertension Mother   . Hypertension Father   . Cancer Neg Hx     Negative FH of Colon or Prostate Cancer    Social History:  separated from wife, lives w/ girlfrined , 3 children  lives in [place]  tobacco--quit  ETOH-- used to drink socially, none x years  Works for [employer] (state)  diet-- trying to eat well exercise -- active at home at work   Review of Systems In general doing well, from time to time he has palpitations, due to see cardiology Denies chest pain, shortness of breath Denies nausea, vomiting, diarrhea or blood in the stools. Occasionally does feel bloated and have lower abdominal discomfort. Denies dysuria, gross hematuria or difficulty urinating. Medication list is reviewed and updated, takes only half simvastatin and HZTZ    Objective:   Physical Exam General -- alert, well-developed, central obesity noted. No apparent distress.  Neck --no thyromegaly Lungs -- normal respiratory effort, no intercostal retractions, no accessory muscle use, and  normal breath sounds.   Heart-- normal rate, regular rhythm, no murmur, and no gallop.   Abdomen--soft, non-tender, no distention, no masses, no HSM, no guarding, and no rigidity.   DRE--normal external examination, no rectal mass felt, prostate not enlarged, no tender or nodular. No stools found Extremities-- no pretibial edema bilaterally  Neurologic-- alert & oriented X3 and strength normal in all extremities. Psych-- Cognition and judgment appear intact. Alert and cooperative with normal attention span and concentration.  not anxious appearing and not depressed appearing.       Assessment & Plan:

## 2012-03-29 NOTE — Assessment & Plan Note (Signed)
Normal ambulatory BPs, taking half HZTZ at the present time

## 2012-03-29 NOTE — Assessment & Plan Note (Signed)
>>  ASSESSMENT AND PLAN FOR DYSLIPIDEMIA WRITTEN ON 03/29/2012  6:13 PM BY Lailie Smead E, MD  On Simvastatin , was recommended to take half tablet instead of one tablet per cardiology Dr.Arida, med list corrected

## 2012-04-02 ENCOUNTER — Encounter: Payer: Self-pay | Admitting: *Deleted

## 2012-04-29 ENCOUNTER — Other Ambulatory Visit: Payer: Self-pay | Admitting: Endocrinology

## 2012-05-21 ENCOUNTER — Ambulatory Visit: Payer: BC Managed Care – PPO | Admitting: Internal Medicine

## 2012-06-01 ENCOUNTER — Other Ambulatory Visit: Payer: Self-pay | Admitting: Cardiology

## 2012-06-11 ENCOUNTER — Ambulatory Visit (INDEPENDENT_AMBULATORY_CARE_PROVIDER_SITE_OTHER): Payer: BC Managed Care – PPO | Admitting: Cardiology

## 2012-06-11 ENCOUNTER — Encounter: Payer: Self-pay | Admitting: Cardiology

## 2012-06-11 VITALS — BP 133/74 | HR 58 | Ht 71.0 in | Wt 237.0 lb

## 2012-06-11 DIAGNOSIS — I4891 Unspecified atrial fibrillation: Secondary | ICD-10-CM

## 2012-06-11 LAB — CBC WITH DIFFERENTIAL/PLATELET
Basophils Relative: 0.1 % (ref 0.0–3.0)
Eosinophils Relative: 5.1 % — ABNORMAL HIGH (ref 0.0–5.0)
Lymphocytes Relative: 30.8 % (ref 12.0–46.0)
MCV: 83.2 fl (ref 78.0–100.0)
Neutrophils Relative %: 56 % (ref 43.0–77.0)
RBC: 5.62 Mil/uL (ref 4.22–5.81)
WBC: 6.3 10*3/uL (ref 4.5–10.5)

## 2012-06-11 MED ORDER — METOPROLOL SUCCINATE ER 25 MG PO TB24: 25.0000 mg | ORAL_TABLET | Freq: Every day | ORAL | Status: DC

## 2012-06-11 NOTE — Progress Notes (Signed)
HPI: Pleasant gentleman for fu of atrial fibrillation. He failed Multaq before and was switched previously to flecainide. Echocardiogram in June 2011 showed normal LV function, mild left atrial enlargement, mild mitral regurgitation and trace tricuspid regurgitation. He has no history of coronary artery disease. He had a nuclear stress test in August of 2011 which overall was normal, EF 69. BUN and creatinine in October of 2013 16 and 0.9. Last seen in Feb 2013. Since then, over the past several months he has had worsening bouts of atrial fibrillation. He describes his heart as racing. There is some chest tightness and dyspnea as well as fatigue. No syncope. He otherwise does not have dyspnea on exertion, orthopnea, PND, exertional chest pain or bleeding. His episodes of atrial fibrillation last approximately one and a half days.  Current Outpatient Prescriptions  Medication Sig Dispense Refill  . Blood Glucose Monitoring Suppl (ONETOUCH VERIO IQ SYSTEM) W/DEVICE KIT 1 Device by Does not apply route once.  1 kit  0  . fish oil-omega-3 fatty acids 1000 MG capsule Take 2 g by mouth 2 (two) times daily.        . flecainide (TAMBOCOR) 100 MG tablet Take 1 tablet (100 mg total) by mouth 2 (two) times daily.  90 tablet  12  . fluticasone (FLONASE) 50 MCG/ACT nasal spray Place into the nose as needed.        Marland Kitchen glucose blood (ONETOUCH VERIO IQ) test strip Use as directed twice daily to check blood sugar dx 250.00  100 each  11  . hydrochlorothiazide (HYDRODIURIL) 25 MG tablet Take 12.5 mg by mouth daily.       . insulin lispro (HUMALOG KWIKPEN) 100 UNIT/ML injection Inject three times a day (just before each meal) 20-20-25 units  15 mL  0  . insulin NPH (HUMULIN N,NOVOLIN N) 100 UNIT/ML injection Inject 25 Units into the skin at bedtime.       Letta Pate DELICA LANCETS MISC Use as directed twice daily dx 250.00  100 each  11  . PRADAXA 150 MG CAPS TAKE ONE CAPSULE BY MOUTH EVERY 12 HOURS  60 capsule  0  .  PROCTOSOL HC 2.5 % rectal cream 2.5 %.      Tana Conch INSULIN SYR 0.5ML/31G 31G X 5/16" 0.5 ML MISC USE ONE SYRINGE THREE TO FOUR TIMES DAILY  100 each  3  . RELION PEN NEEDLE 31G/8MM 31G X 8 MM MISC USE AS DIRECTED FOUR TIMES A DAY.  500 each  1  . simvastatin (ZOCOR) 20 MG tablet Take 10 mg by mouth daily.       . Tamsulosin HCl (FLOMAX) 0.4 MG CAPS Take 1 capsule (0.4 mg total) by mouth daily.  30 capsule  11     Past Medical History  Diagnosis Date  . Diabetes mellitus     Type II dx 1999  . Hyperlipidemia   . Hypertension   . Benign prostatic hypertrophy     s/p TUNA procedure aprx 2006,  . Carpal tunnel syndrome 06/17/2009  . HYPERSOMNIA UNSPECIFIED 12/11/2007  . INSOMNIA-SLEEP DISORDER-UNSPEC 04/12/2010  . Allergic rhinitis   . History of cardiovascular stress test 8/11    Negative  . ED (erectile dysfunction)     After TUNA  . Elevated LFTs 2010    (-) chronic hep  panel  . Atrial fibrillation 12/21/2009    paroxysmal. Failed Multaq    Past Surgical History  Procedure Date  . Vasectomy     several  years ago  . Prostate surgery     TUNA  . Cystoscopy 11-09    neg  @ urology, was rx Flomax    History   Social History  . Marital Status: Divorced    Spouse Name: N/A    Number of Children: 2  . Years of Education: N/A   Occupational History  .      works for [employer] (state)   Social History Main Topics  . Smoking status: Former Smoker    Types: Cigarettes    Quit date: 06/26/1985  . Smokeless tobacco: Not on file  . Alcohol Use: Yes     Comment: socially  . Drug Use: Yes  . Sexually Active: Not on file   Other Topics Concern  . Not on file   Social History Narrative   Separated from wife, has a girlfriendLives [place] [employer] [place])Diet--much better, has lost some wt, feel greatExercise-very actice    ROS: no fevers or chills, productive cough, hemoptysis, dysphasia, odynophagia, melena, hematochezia, dysuria, hematuria, rash, seizure  activity, orthopnea, PND, pedal edema, claudication. Remaining systems are negative.  Physical Exam: Well-developed well-nourished in no acute distress.  Skin is warm and dry.  HEENT is normal.  Neck is supple.  Chest is clear to auscultation with normal expansion.  Cardiovascular exam is regular rate and rhythm.  Abdominal exam nontender or distended. No masses palpated. Extremities show no edema. neuro grossly intact  ECG in sinus bradycardia at a rate of 57. Nonspecific ST changes.

## 2012-06-11 NOTE — Assessment & Plan Note (Signed)
>>  ASSESSMENT AND PLAN FOR DYSLIPIDEMIA WRITTEN ON 06/11/2012  9:26 AM BY CRENSHAW, REDELL RAMAN, MD  Continue statin.

## 2012-06-11 NOTE — Assessment & Plan Note (Signed)
Blood pressure controlled. Continue present medications. 

## 2012-06-11 NOTE — Patient Instructions (Addendum)
Your physician wants you to follow-up in: 6 months.   You will receive a reminder letter in the mail two months in advance. If you don't receive a letter, please call our office to schedule the follow-up appointment.  Your physician has recommended you make the following change in your medication: START Toprol XL 25mg  daily  Your physician recommends that you return for lab work in: today (tsh,cbc)  You have been referred to Dr Johney Frame in electrophysiology   Your physician has requested that you have an echocardiogram. Echocardiography is a painless test that uses sound waves to create images of your heart. It provides your doctor with information about the size and shape of your heart and how well your heart's chambers and valves are working. This procedure takes approximately one hour. There are no restrictions for this procedure.

## 2012-06-11 NOTE — Assessment & Plan Note (Signed)
Continue statin. 

## 2012-06-11 NOTE — Assessment & Plan Note (Signed)
Patient in sinus rhythm today. However he is having increasing frequency of atrial fibrillation episodes. He has failed multaq in the past. He is presently on flecainide. I will refer to Dr. Johney Frame for consideration of ablation. Add Toprol 25 mg daily. Continue pradaxa. Check TSH, hemoglobin and repeat echocardiogram.

## 2012-06-13 ENCOUNTER — Telehealth: Payer: Self-pay | Admitting: Cardiology

## 2012-06-13 NOTE — Telephone Encounter (Signed)
Spoke with pt, aware of lab results. 

## 2012-06-13 NOTE — Telephone Encounter (Signed)
Pt returning nurse call, he can be reached at [phone]

## 2012-06-17 ENCOUNTER — Ambulatory Visit (HOSPITAL_COMMUNITY): Payer: BC Managed Care – PPO | Attending: Cardiology

## 2012-06-17 DIAGNOSIS — R0609 Other forms of dyspnea: Secondary | ICD-10-CM | POA: Insufficient documentation

## 2012-06-17 DIAGNOSIS — I059 Rheumatic mitral valve disease, unspecified: Secondary | ICD-10-CM | POA: Insufficient documentation

## 2012-06-17 DIAGNOSIS — R5381 Other malaise: Secondary | ICD-10-CM | POA: Insufficient documentation

## 2012-06-17 DIAGNOSIS — Z87891 Personal history of nicotine dependence: Secondary | ICD-10-CM | POA: Insufficient documentation

## 2012-06-17 DIAGNOSIS — I517 Cardiomegaly: Secondary | ICD-10-CM | POA: Insufficient documentation

## 2012-06-17 DIAGNOSIS — I4891 Unspecified atrial fibrillation: Secondary | ICD-10-CM

## 2012-06-17 DIAGNOSIS — E785 Hyperlipidemia, unspecified: Secondary | ICD-10-CM | POA: Insufficient documentation

## 2012-06-17 DIAGNOSIS — E119 Type 2 diabetes mellitus without complications: Secondary | ICD-10-CM | POA: Insufficient documentation

## 2012-06-17 DIAGNOSIS — R0989 Other specified symptoms and signs involving the circulatory and respiratory systems: Secondary | ICD-10-CM | POA: Insufficient documentation

## 2012-06-17 DIAGNOSIS — R0789 Other chest pain: Secondary | ICD-10-CM | POA: Insufficient documentation

## 2012-06-17 NOTE — Progress Notes (Signed)
Echocardiogram performed.  

## 2012-06-26 ENCOUNTER — Other Ambulatory Visit: Payer: Self-pay | Admitting: Endocrinology

## 2012-06-27 ENCOUNTER — Telehealth: Payer: Self-pay | Admitting: Internal Medicine

## 2012-06-27 MED ORDER — HYDROCHLOROTHIAZIDE 25 MG PO TABS: 12.5000 mg | ORAL_TABLET | Freq: Every day | ORAL | Status: DC

## 2012-06-27 NOTE — Telephone Encounter (Signed)
Refill done.  

## 2012-06-27 NOTE — Telephone Encounter (Signed)
Refill: Hydrochlorothiazide 25 mg tab. Take one tablet by mouth every day. Qty 100. Last fill 01-07-12

## 2012-07-02 ENCOUNTER — Other Ambulatory Visit: Payer: Self-pay | Admitting: *Deleted

## 2012-07-02 MED ORDER — INSULIN LISPRO 100 UNIT/ML ~~LOC~~ SOLN: SUBCUTANEOUS | Status: DC

## 2012-07-08 ENCOUNTER — Encounter: Payer: Self-pay | Admitting: *Deleted

## 2012-07-08 ENCOUNTER — Ambulatory Visit (INDEPENDENT_AMBULATORY_CARE_PROVIDER_SITE_OTHER): Payer: BC Managed Care – PPO | Admitting: Endocrinology

## 2012-07-08 VITALS — BP 134/80 | HR 59 | Temp 97.8°F | Wt 236.0 lb

## 2012-07-08 DIAGNOSIS — E119 Type 2 diabetes mellitus without complications: Secondary | ICD-10-CM

## 2012-07-08 LAB — HEMOGLOBIN A1C: Hgb A1c MFr Bld: 8.3 % — ABNORMAL HIGH (ref 4.6–6.5)

## 2012-07-08 NOTE — Progress Notes (Signed)
Subjective:    Patient ID: Danny [name], male    DOB: 01/02/1961, 52 y.o.   MRN: [id number]  HPI Pt returns for f/u of insulin-requiring DM (dx'ed 1992; no known complications).  no cbg record, but states cbg's are still highest at HS.  pt states he feels well in general.   Past Medical History  Diagnosis Date  . Diabetes mellitus     Type II dx 1999  . Hyperlipidemia   . Hypertension   . Benign prostatic hypertrophy     s/p TUNA procedure aprx 2006,  . Carpal tunnel syndrome 06/17/2009  . HYPERSOMNIA UNSPECIFIED 12/11/2007  . INSOMNIA-SLEEP DISORDER-UNSPEC 04/12/2010  . Allergic rhinitis   . History of cardiovascular stress test 8/11    Negative  . ED (erectile dysfunction)     After TUNA  . Elevated LFTs 2010    (-) chronic hep  panel  . Atrial fibrillation 12/21/2009    paroxysmal. Failed Multaq    Past Surgical History  Procedure Date  . Vasectomy     several years ago  . Prostate surgery     TUNA  . Cystoscopy 11-09    neg  @ urology, was rx Flomax    History   Social History  . Marital Status: Divorced    Spouse Name: N/A    Number of Children: 2  . Years of Education: N/A   Occupational History  .      works for [employer] (state)   Social History Main Topics  . Smoking status: Former Smoker    Types: Cigarettes    Quit date: 06/26/1985  . Smokeless tobacco: Not on file  . Alcohol Use: Yes     Comment: socially  . Drug Use: Yes  . Sexually Active: Not on file   Other Topics Concern  . Not on file   Social History Narrative   Separated from wife, has a girlfriend[place]ives [place] [employer] [place]Diet--much better, has lost some wt, feel greatExercise-very actice    Current Outpatient Prescriptions on File Prior to Visit  Medication Sig Dispense Refill  . Blood Glucose Monitoring Suppl (ONETOUCH VERIO IQ SYSTEM) W/DEVICE KIT 1 Device by Does not apply route once.  1 kit  0  . fish oil-omega-3 fatty acids 1000 MG capsule Take 2 g by mouth 2  (two) times daily.        . flecainide (TAMBOCOR) 100 MG tablet Take 1 tablet (100 mg total) by mouth 2 (two) times daily.  90 tablet  12  . fluticasone (FLONASE) 50 MCG/ACT nasal spray Place into the nose as needed.        Marland Kitchen glucose blood (ONETOUCH VERIO IQ) test strip Use as directed twice daily to check blood sugar dx 250.00  100 each  11  . hydrochlorothiazide (HYDRODIURIL) 25 MG tablet Take 0.5 tablets (12.5 mg total) by mouth daily.  45 tablet  2  . insulin lispro (HUMALOG) 100 UNIT/ML injection Inject three times a day (just before each meal) 20-20-30 units      . insulin NPH (HUMULIN N,NOVOLIN N) 100 UNIT/ML injection Inject 25 Units into the skin at bedtime.       . metoprolol succinate (TOPROL XL) 25 MG 24 hr tablet Take 1 tablet (25 mg total) by mouth daily.  30 tablet  6  . ONETOUCH DELICA LANCETS 33G MISC USE ONE UNIT TO CHECK GLUCOSE TWICE DAILY  100 each  11  . PRADAXA 150 MG CAPS TAKE ONE CAPSULE BY  MOUTH EVERY 12 HOURS  60 capsule  0  . PROCTOSOL HC 2.5 % rectal cream 2.5 %.      Tana Conch INSULIN SYR 0.5ML/31G 31G X 5/16" 0.5 ML MISC USE ONE SYRINGE THREE TO FOUR TIMES DAILY  100 each  3  . RELION PEN NEEDLE 31G/8MM 31G X 8 MM MISC USE AS DIRECTED FOUR TIMES A DAY.  500 each  1  . simvastatin (ZOCOR) 20 MG tablet Take 10 mg by mouth daily.       . Tamsulosin HCl (FLOMAX) 0.4 MG CAPS Take 1 capsule (0.4 mg total) by mouth daily.  30 capsule  11    Allergies  Allergen Reactions  . Metformin     REACTION: pt states caused liver injury some years ago   Family History  Problem Relation Age of Onset  . Heart disease Mother     M-- MI,CHF  . Diabetes Mother   . Hypertension Mother   . Hypertension Father   . Cancer Neg Hx     Negative FH of Colon or Prostate Cancer    BP 134/80  Pulse 59  Temp 97.8 F (36.6 C) (Oral)  Wt 236 lb (107.049 kg)  SpO2 94%  Review of Systems denies hypoglycemia    Objective:   Physical Exam VITAL SIGNS:  See vs page GENERAL: no  distress SKIN:  Insulin injection sites at the triceps areas are normal  Lab Results  Component Value Date   HGBA1C 8.3* 07/08/2012      Assessment & Plan:  DM: needs increased rx

## 2012-07-08 NOTE — Patient Instructions (Addendum)
check your blood sugar 2 times a day.  vary the time of day when you check, between before the 3 meals, and at bedtime.  also check if you have symptoms of your blood sugar being too high or too low.  please keep a record of the readings and bring it to your next appointment here.  please call us sooner if your blood sugar goes below 70, or if it stays over 200.   Please come back for a follow-up appointment in 4 months.   Please increase humalog to three times a day (just before each meal) 20-20-30 units.   Please consider having an insulin pump.  Call if you want to see a diabetes education specialist to pursue this.  blood tests are being requested for you today.  We'll contact you with results.

## 2012-07-15 ENCOUNTER — Other Ambulatory Visit: Payer: Self-pay | Admitting: Cardiology

## 2012-07-25 ENCOUNTER — Ambulatory Visit: Payer: BC Managed Care – PPO | Admitting: Internal Medicine

## 2012-08-14 ENCOUNTER — Other Ambulatory Visit: Payer: Self-pay | Admitting: Cardiology

## 2012-08-15 ENCOUNTER — Other Ambulatory Visit: Payer: Self-pay | Admitting: Cardiology

## 2012-08-22 ENCOUNTER — Ambulatory Visit: Payer: BC Managed Care – PPO | Admitting: Internal Medicine

## 2012-09-16 ENCOUNTER — Other Ambulatory Visit: Payer: Self-pay | Admitting: Cardiology

## 2012-09-18 ENCOUNTER — Ambulatory Visit (INDEPENDENT_AMBULATORY_CARE_PROVIDER_SITE_OTHER): Payer: BC Managed Care – PPO | Admitting: Internal Medicine

## 2012-09-18 ENCOUNTER — Encounter: Payer: Self-pay | Admitting: Internal Medicine

## 2012-09-18 VITALS — BP 155/81 | HR 59 | Ht 71.0 in | Wt 242.6 lb

## 2012-09-18 DIAGNOSIS — I10 Essential (primary) hypertension: Secondary | ICD-10-CM

## 2012-09-18 DIAGNOSIS — I4891 Unspecified atrial fibrillation: Secondary | ICD-10-CM

## 2012-09-18 MED ORDER — HYDROCHLOROTHIAZIDE 25 MG PO TABS: 25.0000 mg | ORAL_TABLET | Freq: Every day | ORAL | Status: DC

## 2012-09-18 MED ORDER — DABIGATRAN ETEXILATE MESYLATE 150 MG PO CAPS: 150.0000 mg | ORAL_CAPSULE | Freq: Two times a day (BID) | ORAL | Status: DC

## 2012-09-18 NOTE — Progress Notes (Signed)
Primary Care Physician: [name], MD Referring Physician:  Dr Neldon Newport is a 52 y.o. male with a h/o paroxysmal atrial fibrillation who presents today for EP consultation.  He reports initially being diagnosed with atrial fibrillation in 2011 after presenting with palpitations and fatigue.  He was placed on multaq at that time.  He did not find significant improvement with this medicine.  He was subsequently placed on flecainide.  He did well with flecainide initially.  Unfortunately, he has had recurrent afib despite flecainide.  Presently, he reports that he has afib about 1-2 times per month, lasting several hours to several days.  He reports that he feels "rough" during afib.  He reports SOB and decreased exercise tolerance.  He has no energy and reports that he has to "lay down".  He is unaware of any triggers or precipitants (other than stress). Today, he denies symptoms of chest pain, shortness of breath, orthopnea, PND,  dizziness, presyncope, syncope, or neurologic sequela.  He has edema. The patient is tolerating medications without difficulties and is otherwise without complaint today.   Past Medical History  Diagnosis Date  . Diabetes mellitus     Type II dx 1999  . Hyperlipidemia   . Hypertension   . Benign prostatic hypertrophy     s/p TUNA procedure aprx 2006,  . Carpal tunnel syndrome 06/17/2009  . HYPERSOMNIA UNSPECIFIED 12/11/2007  . INSOMNIA-SLEEP DISORDER-UNSPEC 04/12/2010  . Allergic rhinitis   . History of cardiovascular stress test 8/11    Negative  . ED (erectile dysfunction)     After TUNA  . Elevated LFTs 2010    (-) chronic hep  panel  . Atrial fibrillation 12/21/2009    paroxysmal. Failed Multaq   Past Surgical History  Procedure Laterality Date  . Vasectomy      several years ago  . Prostate surgery      TUNA  . Cystoscopy  11-09    neg  @ urology, was rx Flomax    Current Outpatient Prescriptions  Medication Sig Dispense Refill   . Blood Glucose Monitoring Suppl (ONETOUCH VERIO IQ SYSTEM) W/DEVICE KIT 1 Device by Does not apply route once.  1 kit  0  . fish oil-omega-3 fatty acids 1000 MG capsule Take 2 g by mouth 2 (two) times daily.        . fluticasone (FLONASE) 50 MCG/ACT nasal spray Place into the nose as needed.        Marland Kitchen glucose blood (ONETOUCH VERIO IQ) test strip Use as directed twice daily to check blood sugar dx 250.00  100 each  11  . hydrochlorothiazide (HYDRODIURIL) 25 MG tablet Take 0.5 tablets (12.5 mg total) by mouth daily.  45 tablet  2  . insulin lispro (HUMALOG) 100 UNIT/ML injection Inject three times a day (just before each meal) 20-20-30 units      . insulin NPH (HUMULIN N,NOVOLIN N) 100 UNIT/ML injection Inject 25 Units into the skin at bedtime.       . metoprolol succinate (TOPROL XL) 25 MG 24 hr tablet Take 1 tablet (25 mg total) by mouth daily.  30 tablet  6  . ONETOUCH DELICA LANCETS 33G MISC USE ONE UNIT TO CHECK GLUCOSE TWICE DAILY  100 each  11  . PRADAXA 150 MG CAPS TAKE ONE CAPSULE BY MOUTH EVERY 12 HOURS  60 capsule  0  . PROCTOSOL HC 2.5 % rectal cream 2.5 %.      Tana Conch INSULIN  SYR 0.5ML/31G 31G X 5/16" 0.5 ML MISC USE ONE SYRINGE THREE TO FOUR TIMES DAILY  100 each  3  . RELION PEN NEEDLE 31G/8MM 31G X 8 MM MISC USE AS DIRECTED FOUR TIMES A DAY.  500 each  1  . simvastatin (ZOCOR) 20 MG tablet Take 10 mg by mouth daily.       . Tamsulosin HCl (FLOMAX) 0.4 MG CAPS Take 1 capsule (0.4 mg total) by mouth daily.  30 capsule  11  . flecainide (TAMBOCOR) 100 MG tablet 1 tab po bid  90 tablet  3   No current facility-administered medications for this visit.    Allergies  Allergen Reactions  . Metformin     REACTION: pt states caused liver injury some years ago    History   Social History  . Marital Status: Divorced    Spouse Name: N/A    Number of Children: 2  . Years of Education: N/A   Occupational History  .      works for [employer] (state)   Social History Main Topics  .  Smoking status: Former Smoker    Types: Cigarettes    Quit date: 06/26/1985  . Smokeless tobacco: Not on file  . Alcohol Use: Yes     Comment: socially  . Drug Use: No  . Sexually Active: Not on file   Other Topics Concern  . Not on file   Social History Narrative   Separated from wife, has a girlfriend   Lives in [place]   Works for [employer] (state).  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 Heart disease Mother     M-- MI,CHF  . Diabetes Mother   . Hypertension Mother   . Hypertension Father   . Cancer Neg Hx     Negative FH of Colon or Prostate Cancer    ROS- All systems are reviewed and negative except as per the HPI above  Physical Exam: Filed Vitals:   09/18/12 1605  BP: 155/81  Pulse: 59  Height: 5\' 11"  (1.803 m)  Weight: 242 lb 9.6 oz (110.043 kg)  SpO2: 98%    GEN- The patient is well appearing, alert and oriented x 3 today.   Head- normocephalic, atraumatic Eyes-  Sclera clear, conjunctiva pink Ears- hearing intact Oropharynx- clear Neck- supple, no JVP Lymph- no cervical lymphadenopathy Lungs- Clear to ausculation bilaterally, normal work of breathing Heart- Regular rate and rhythm, no murmurs, rubs or gallops, PMI not laterally displaced GI- soft, NT, ND, + BS Extremities- no clubbing, cyanosis, or edema MS- no significant deformity or atrophy Skin- no rash or lesion Psych- euthymic mood, full affect Neuro- strength and sensation are intact  EKG today reveals sinus rhythm 57 bpm, otherwise normal ekg Echo and Dr Waunita Schooner notes are reviewed  Assessment and Plan:  1. Paroxysmal atrial fibrillation. The patient has very symptomatic paroxysmal atrial fibrillation.  He has failed medical therapy with multaq and flecainide. He is appropriately anticoagulated with pradaxa. Therapeutic strategies for afib including medicine and ablation were  discussed in detail with the patient today. Risk, benefits, and alternatives to EP study and radiofrequency ablation for afib were also discussed in detail today.  I think that he would be a good candidate for ablation.  These risks include but are not limited to stroke, bleeding, vascular damage, tamponade, perforation, damage to the esophagus,  lungs, and other structures, pulmonary vein stenosis, worsening renal function, and death. The patient understands these risk and wishes to contemplate his options further. His biggest concern today is his out of pocket costs with the procedure.  I have encouraged him to discuss this further with his insurance company.  He will contact my office if he decides to proceed.  He will keep his scheduled follow-up with Dr Jens Som in the interim.  2. HTN Stable No change required today

## 2012-09-18 NOTE — Patient Instructions (Addendum)
Your physician recommends that you schedule a follow-up appointment with Dr Jens Som   Your physician has recommended you make the following change in your medication:  1) Increase HCTZ to 25mg  daily

## 2012-10-23 ENCOUNTER — Other Ambulatory Visit: Payer: Self-pay | Admitting: Endocrinology

## 2012-11-04 ENCOUNTER — Ambulatory Visit (INDEPENDENT_AMBULATORY_CARE_PROVIDER_SITE_OTHER): Payer: BC Managed Care – PPO | Admitting: Internal Medicine

## 2012-11-04 VITALS — BP 130/80 | HR 65 | Temp 98.1°F | Wt 240.0 lb

## 2012-11-04 DIAGNOSIS — R05 Cough: Secondary | ICD-10-CM

## 2012-11-04 MED ORDER — HYDROCODONE-HOMATROPINE 5-1.5 MG/5ML PO SYRP: 5.0000 mL | ORAL_SOLUTION | Freq: Four times a day (QID) | ORAL | Status: DC | PRN

## 2012-11-04 MED ORDER — FLUTICASONE PROPIONATE 50 MCG/ACT NA SUSP: 2.0000 | Freq: Every day | NASAL | Status: DC

## 2012-11-04 MED ORDER — DOXYCYCLINE HYCLATE 100 MG PO TABS: 100.0000 mg | ORAL_TABLET | Freq: Two times a day (BID) | ORAL | Status: DC

## 2012-11-04 NOTE — Patient Instructions (Addendum)
Rest, fluids , tylenol, Claritin 10 mg OTC one tablet daily until better For cough, take Mucinex DM twice a day as needed  If the cough is severe, take hydrocodone, watch for somnolence. For congestion go back to Flonase Take the antibiotic as prescribed  (Doxycycline) Call if no better in few days Call anytime if the symptoms are severe  Also call if the pain at the stomach is not improving as the cough improves or if it gets worse.

## 2012-11-04 NOTE — Progress Notes (Signed)
  Subjective:    Patient ID: Danny [name], male    DOB: [birth year], 52 y.o.   MRN: [id number]  HPI Acute visit, here with his girlfriend. One month history of cough, symptoms are day or night, they come in "spells". Has tried different OTCs without much help. Cough is associated with right upper quadrant pain. The RUQ IS not related with food intake, and denies any nausea or vomiting. He denies fever, chills. No sputum production. He has abundant postnasal dripping. Occasionally blows his nose, color is clear. Initially had itchy eyes and nose but that is better. Still feel his sinuses and ears are congested. Has not been using Flonase lately  Past Medical History  Diagnosis Date  . Diabetes mellitus     Type II dx 1999  . Hyperlipidemia   . Hypertension   . Benign prostatic hypertrophy     s/p TUNA procedure aprx 2006,  . Carpal tunnel syndrome 06/17/2009  . HYPERSOMNIA UNSPECIFIED 12/11/2007  . INSOMNIA-SLEEP DISORDER-UNSPEC 04/12/2010  . Allergic rhinitis   . History of cardiovascular stress test 8/11    Negative  . ED (erectile dysfunction)     After TUNA  . Elevated LFTs 2010    (-) chronic hep  panel  . Atrial fibrillation 12/21/2009    paroxysmal. Failed Multaq   Past Surgical History  Procedure Laterality Date  . Vasectomy      several years ago  . Prostate surgery      TUNA  . Cystoscopy  11-09    neg  @ urology, was rx Flomax    Review of Systems See history of present illness    Objective:   Physical Exam  Abdominal:     BP 130/80  Pulse 65  Temp(Src) 98.1 F (36.7 C) (Oral)  Wt 240 lb (108.863 kg)  BMI 33.49 kg/m2  SpO2 96%  General -- alert, well-developed, Nontoxic appearing  HEENT -- TMs normal, throat w/o redness, face symmetric and not tender to palpation, Nose is quite congested Lungs -- normal respiratory effort, no intercostal retractions, no accessory muscle use, and few rhonchi bilateral with cough Heart-- normal rate, regular  rhythm, no murmur, and no gallop.    Psych-- Cognition and judgment appear intact. Alert and cooperative with normal attention span and concentration.  not anxious appearing and not depressed appearing.          Assessment & Plan:  Cough, Persisting cough  in the setting of a bone postnasal dripping and ronchi on exam. Cough related to either allergies or an infection (?sinusitis or bronchitis) Plan: see  Instructions, he is on Pradaxa,No interactions noted. Right upper quadrant pain, likely related to cough, observation for now.

## 2012-11-05 ENCOUNTER — Ambulatory Visit (INDEPENDENT_AMBULATORY_CARE_PROVIDER_SITE_OTHER): Payer: BC Managed Care – PPO | Admitting: Endocrinology

## 2012-11-05 ENCOUNTER — Encounter: Payer: Self-pay | Admitting: Endocrinology

## 2012-11-05 ENCOUNTER — Ambulatory Visit: Payer: BC Managed Care – PPO | Admitting: Endocrinology

## 2012-11-05 VITALS — BP 134/80 | HR 65 | Ht 71.0 in | Wt 237.0 lb

## 2012-11-05 DIAGNOSIS — E119 Type 2 diabetes mellitus without complications: Secondary | ICD-10-CM

## 2012-11-05 LAB — HEMOGLOBIN A1C: Hgb A1c MFr Bld: 8.7 % — ABNORMAL HIGH (ref 4.6–6.5)

## 2012-11-05 LAB — MICROALBUMIN / CREATININE URINE RATIO
Creatinine,U: 179.9 mg/dL
Microalb, Ur: 1.7 mg/dL (ref 0.0–1.9)

## 2012-11-05 MED ORDER — INSULIN ASPART 100 UNIT/ML FLEXPEN: PEN_INJECTOR | SUBCUTANEOUS | Status: DC

## 2012-11-05 NOTE — Progress Notes (Signed)
Subjective:    Patient ID: Danny [name], male    DOB: 1961/05/12, 52 y.o.   MRN: [id number]  HPI Pt returns for f/u of insulin-requiring DM (dx'ed 1992; no known complications).  no cbg record, but states cbg's are occasionally mildly low with exertion.  He says it is otherwise well-controlled. Past Medical History  Diagnosis Date  . Diabetes mellitus     Type II dx 1999  . Hyperlipidemia   . Hypertension   . Benign prostatic hypertrophy     s/p TUNA procedure aprx 2006,  . Carpal tunnel syndrome 06/17/2009  . HYPERSOMNIA UNSPECIFIED 12/11/2007  . INSOMNIA-SLEEP DISORDER-UNSPEC 04/12/2010  . Allergic rhinitis   . History of cardiovascular stress test 8/11    Negative  . ED (erectile dysfunction)     After TUNA  . Elevated LFTs 2010    (-) chronic hep  panel  . Atrial fibrillation 12/21/2009    paroxysmal. Failed Multaq    Past Surgical History  Procedure Laterality Date  . Vasectomy      several years ago  . Prostate surgery      TUNA  . Cystoscopy  11-09    neg  @ urology, was rx Flomax    History   Social History  . Marital Status: Divorced    Spouse Name: N/A    Number of Children: 2  . Years of Education: N/A   Occupational History  .      works for [employer] (state)   Social History Main Topics  . Smoking status: Former Smoker    Types: Cigarettes    Quit date: 06/26/1985  . Smokeless tobacco: Not on file  . Alcohol Use: Yes     Comment: socially  . Drug Use: No  . Sexually Active: Not on file   Other Topics Concern  . Not on file   Social History Narrative   Separated from wife, has a girlfriend   Lives in [place]   Works for [employer] (state).  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 Blood Glucose Monitoring Suppl (ONETOUCH VERIO IQ SYSTEM) W/DEVICE KIT 1 Device by Does not apply route once.  1  kit  0  . dabigatran (PRADAXA) 150 MG CAPS Take 1 capsule (150 mg total) by mouth every 12 (twelve) hours.  180 capsule  3  . doxycycline (VIBRA-TABS) 100 MG tablet Take 1 tablet (100 mg total) by mouth 2 (two) times daily.  20 tablet  0  . fish oil-omega-3 fatty acids 1000 MG capsule Take 2 g by mouth 2 (two) times daily.        . flecainide (TAMBOCOR) 100 MG tablet 1 tab po bid  90 tablet  3  . fluticasone (FLONASE) 50 MCG/ACT nasal spray Place 2 sprays into the nose daily.  16 g  6  . glucose blood (ONETOUCH VERIO IQ) test strip Use as directed twice daily to check blood sugar dx 250.00  100 each  11  . hydrochlorothiazide (HYDRODIURIL) 25 MG tablet Take 1 tablet (25 mg total) by mouth daily.  90 tablet  3  . HYDROcodone-homatropine (HYCODAN) 5-1.5 MG/5ML syrup Take 5 mLs by mouth every 6 (six) hours as needed for cough.  180 mL  0  . insulin NPH (HUMULIN N,NOVOLIN N) 100 UNIT/ML injection Inject 25 Units into the skin at  bedtime.       Providence Lanius Omega-3 300 MG CAPS Take 1 capsule by mouth daily.      . metoprolol succinate (TOPROL XL) 25 MG 24 hr tablet Take 1 tablet (25 mg total) by mouth daily.  30 tablet  6  . ONETOUCH DELICA LANCETS 33G MISC USE ONE UNIT TO CHECK GLUCOSE TWICE DAILY  100 each  11  . PROCTOSOL HC 2.5 % rectal cream 2.5 %.      Tana Conch INSULIN SYR 0.5ML/31G 31G X 5/16" 0.5 ML MISC USE ONE SYRINGE THREE TO FOUR TIMES DAILY  100 each  3  . RELION PEN NEEDLE 31G/8MM 31G X 8 MM MISC USE AS DIRECTED FOUR TIMES A DAY.  500 each  1  . simvastatin (ZOCOR) 20 MG tablet Take 10 mg by mouth daily.       . Tamsulosin HCl (FLOMAX) 0.4 MG CAPS Take 1 capsule (0.4 mg total) by mouth daily.  30 capsule  11   No current facility-administered medications on file prior to visit.    Allergies  Allergen Reactions  . Metformin     REACTION: pt states caused liver injury some years ago    Family History  Problem Relation Age of Onset  . Heart disease Mother     M-- MI,CHF  .  Diabetes Mother   . Hypertension Mother   . Hypertension Father   . Cancer Neg Hx     Negative FH of Colon or Prostate Cancer    BP 134/80  Pulse 65  Ht 5\' 11"  (1.803 m)  Wt 237 lb (107.502 kg)  BMI 33.07 kg/m2  SpO2 95%   Review of Systems Denies LOC    Objective:   Physical Exam VITAL SIGNS:  See vs page GENERAL: no distress   Lab Results  Component Value Date   HGBA1C 8.7* 11/05/2012      Assessment & Plan:  DM: needs increased rx

## 2012-11-05 NOTE — Patient Instructions (Addendum)
check your blood sugar 2 times a day.  vary the time of day when you check, between before the 3 meals, and at bedtime.  also check if you have symptoms of your blood sugar being too high or too low.  please keep a record of the readings and bring it to your next appointment here.  please call us sooner if your blood sugar goes below 70, or if it stays over 200.   Please come back for a follow-up appointment in 3 months.  pending the test results, please take novolog, three times a day (just before each meal) 20-20-30 units.  If you are going to be active, take half as much.   blood tests are being requested for you today.  We'll contact you with results.

## 2012-11-06 ENCOUNTER — Encounter: Payer: Self-pay | Admitting: Internal Medicine

## 2012-11-07 ENCOUNTER — Other Ambulatory Visit: Payer: Self-pay | Admitting: Cardiology

## 2012-11-07 ENCOUNTER — Other Ambulatory Visit: Payer: Self-pay | Admitting: Endocrinology

## 2012-11-25 HISTORY — PX: COLONOSCOPY: SHX174

## 2012-11-25 LAB — HM COLONOSCOPY

## 2012-12-06 ENCOUNTER — Other Ambulatory Visit: Payer: Self-pay | Admitting: Cardiology

## 2012-12-16 ENCOUNTER — Encounter: Payer: BC Managed Care – PPO | Admitting: Cardiology

## 2012-12-16 NOTE — Progress Notes (Signed)
HPI: Pleasant gentleman for fu of atrial fibrillation. He failed Multaq before and was switched previously to flecainide but had increased episodes of atrial fibrillation. He had a nuclear stress test in August of 2011 which overall was normal, EF 69. Echocardiogram in December 2013 showed an ejection fraction of 55-65%, mild left atrial enlargement and mild mitral regurgitation. Patient seen by Dr. Johney Frame in March of 2014 because of increasing frequency of atrial fibrillation. He was felt to be a good candidate for ablation but the patient wanted to consider his options and was concerned about the cost. Since that time,   Current Outpatient Prescriptions  Medication Sig Dispense Refill  . Blood Glucose Monitoring Suppl (ONETOUCH VERIO IQ SYSTEM) W/DEVICE KIT 1 Device by Does not apply route once.  1 kit  0  . dabigatran (PRADAXA) 150 MG CAPS Take 1 capsule (150 mg total) by mouth every 12 (twelve) hours.  180 capsule  3  . doxycycline (VIBRA-TABS) 100 MG tablet Take 1 tablet (100 mg total) by mouth 2 (two) times daily.  20 tablet  0  . fish oil-omega-3 fatty acids 1000 MG capsule Take 2 g by mouth 2 (two) times daily.        . flecainide (TAMBOCOR) 100 MG tablet 1 tab po bid  90 tablet  3  . fluticasone (FLONASE) 50 MCG/ACT nasal spray Place 2 sprays into the nose daily.  16 g  6  . glucose blood (ONETOUCH VERIO IQ) test strip Use as directed twice daily to check blood sugar dx 250.00  100 each  11  . hydrochlorothiazide (HYDRODIURIL) 25 MG tablet Take 1 tablet (25 mg total) by mouth daily.  90 tablet  3  . HYDROcodone-homatropine (HYCODAN) 5-1.5 MG/5ML syrup Take 5 mLs by mouth every 6 (six) hours as needed for cough.  180 mL  0  . insulin aspart (NOVOLOG) 100 unit/mL SOLN FlexPen Inject three times a day (just before each meal) 30-30-40 units, and pen needles 3/day      . insulin NPH (HUMULIN N,NOVOLIN N) 100 UNIT/ML injection Inject 25 Units into the skin at bedtime.       Providence Lanius Omega-3  300 MG CAPS Take 1 capsule by mouth daily.      . metoprolol succinate (TOPROL XL) 25 MG 24 hr tablet Take 1 tablet (25 mg total) by mouth daily.  30 tablet  6  . ONETOUCH DELICA LANCETS 33G MISC USE ONE UNIT TO CHECK GLUCOSE TWICE DAILY  100 each  11  . PRADAXA 150 MG CAPS TAKE ONE CAPSULE BY MOUTH EVERY 12 HOURS  60 capsule  0  . PROCTOSOL HC 2.5 % rectal cream 2.5 %.      Tana Conch INSULIN SYR 0.5ML/31G 31G X 5/16" 0.5 ML MISC USE ONE SYRINGE THREE TO FOUR TIMES DAILY  100 each  3  . RELION PEN NEEDLE 31G/8MM 31G X 8 MM MISC USE AS DIRECTED FOUR TIMES A DAY.  500 each  1  . simvastatin (ZOCOR) 20 MG tablet Take 10 mg by mouth daily.       . simvastatin (ZOCOR) 20 MG tablet TAKE ONE TABLET BY MOUTH EVERY NIGHT AT BEDTIME  90 tablet  0  . Tamsulosin HCl (FLOMAX) 0.4 MG CAPS Take 1 capsule (0.4 mg total) by mouth daily.  30 capsule  11   No current facility-administered medications for this visit.     Past Medical History  Diagnosis Date  . Diabetes mellitus  Type II dx 1999  . Hyperlipidemia   . Hypertension   . Benign prostatic hypertrophy     s/p TUNA procedure aprx 2006,  . Carpal tunnel syndrome 06/17/2009  . HYPERSOMNIA UNSPECIFIED 12/11/2007  . INSOMNIA-SLEEP DISORDER-UNSPEC 04/12/2010  . Allergic rhinitis   . History of cardiovascular stress test 8/11    Negative  . ED (erectile dysfunction)     After TUNA  . Elevated LFTs 2010    (-) chronic hep  panel  . Atrial fibrillation 12/21/2009    paroxysmal. Failed Multaq    Past Surgical History  Procedure Laterality Date  . Vasectomy      several years ago  . Prostate surgery      TUNA  . Cystoscopy  11-09    neg  @ urology, was rx Flomax    History   Social History  . Marital Status: Divorced    Spouse Name: N/A    Number of Children: 2  . Years of Education: N/A   Occupational History  .      works for [employer] (state)   Social History Main Topics  . Smoking status: Former Smoker    Types: Cigarettes     Quit date: 06/26/1985  . Smokeless tobacco: Not on file  . Alcohol Use: Yes     Comment: socially  . Drug Use: No  . Sexually Active: Not on file   Other Topics Concern  . Not on file   Social History Narrative   Separated from wife, has a girlfriend   Lives in [place]   Works for [employer] (state).  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 Remaining systems are negative.  Physical Exam: Well-developed well-nourished in no acute distress.  Skin is warm and dry.  HEENT is normal.  Neck is supple.  Chest is clear to auscultation with normal expansion.  Cardiovascular exam is regular rate and rhythm.  Abdominal exam nontender or distended. No masses palpated. Extremities show no edema. neuro grossly intact  ECG     This encounter was created in error - please disregard.

## 2013-01-06 ENCOUNTER — Other Ambulatory Visit: Payer: Self-pay | Admitting: Cardiology

## 2013-01-13 ENCOUNTER — Other Ambulatory Visit: Payer: Self-pay | Admitting: Cardiology

## 2013-01-16 ENCOUNTER — Other Ambulatory Visit: Payer: Self-pay | Admitting: *Deleted

## 2013-01-16 MED ORDER — "INSULIN SYRINGE-NEEDLE U-100 31G X 5/16"" 0.5 ML MISC": Status: DC

## 2013-01-20 ENCOUNTER — Other Ambulatory Visit: Payer: Self-pay | Admitting: *Deleted

## 2013-01-20 MED ORDER — "INSULIN SYRINGE-NEEDLE U-100 31G X 5/16"" 0.5 ML MISC": Status: DC

## 2013-02-05 ENCOUNTER — Ambulatory Visit: Payer: BC Managed Care – PPO | Admitting: Endocrinology

## 2013-02-05 DIAGNOSIS — Z0289 Encounter for other administrative examinations: Secondary | ICD-10-CM

## 2013-02-08 ENCOUNTER — Other Ambulatory Visit: Payer: Self-pay | Admitting: Cardiology

## 2013-02-12 ENCOUNTER — Other Ambulatory Visit: Payer: Self-pay | Admitting: *Deleted

## 2013-02-12 ENCOUNTER — Other Ambulatory Visit: Payer: Self-pay | Admitting: Cardiology

## 2013-02-12 MED ORDER — FLECAINIDE ACETATE 100 MG PO TABS: ORAL_TABLET | ORAL | Status: DC

## 2013-03-11 ENCOUNTER — Other Ambulatory Visit: Payer: Self-pay | Admitting: Internal Medicine

## 2013-03-26 ENCOUNTER — Other Ambulatory Visit: Payer: Self-pay | Admitting: Cardiology

## 2013-03-27 ENCOUNTER — Telehealth: Payer: Self-pay

## 2013-03-27 DIAGNOSIS — I4891 Unspecified atrial fibrillation: Secondary | ICD-10-CM

## 2013-03-27 NOTE — Telephone Encounter (Signed)
Will call patient back. At work.

## 2013-03-28 MED ORDER — FLECAINIDE ACETATE 100 MG PO TABS: ORAL_TABLET | ORAL | Status: DC

## 2013-03-28 NOTE — Telephone Encounter (Signed)
HM UTD WE flu vaccine Sees Endocrinology and Cardiology Meds reconciled, pharmacy and allergies verified Notes having pain in feet and elevated sugars. Unable to afford Humlin so has not been taking

## 2013-03-31 ENCOUNTER — Ambulatory Visit (INDEPENDENT_AMBULATORY_CARE_PROVIDER_SITE_OTHER): Payer: BC Managed Care – PPO | Admitting: Internal Medicine

## 2013-03-31 ENCOUNTER — Encounter: Payer: Self-pay | Admitting: Internal Medicine

## 2013-03-31 VITALS — BP 127/76 | HR 81 | Temp 97.9°F | Ht 71.5 in | Wt 239.0 lb

## 2013-03-31 DIAGNOSIS — Z0000 Encounter for general adult medical examination without abnormal findings: Secondary | ICD-10-CM

## 2013-03-31 DIAGNOSIS — I10 Essential (primary) hypertension: Secondary | ICD-10-CM

## 2013-03-31 DIAGNOSIS — E119 Type 2 diabetes mellitus without complications: Secondary | ICD-10-CM

## 2013-03-31 DIAGNOSIS — N400 Enlarged prostate without lower urinary tract symptoms: Secondary | ICD-10-CM

## 2013-03-31 DIAGNOSIS — N50819 Testicular pain, unspecified: Secondary | ICD-10-CM

## 2013-03-31 LAB — LIPID PANEL
HDL: 32.3 mg/dL — ABNORMAL LOW (ref >39.00)
LDL Cholesterol: 67 mg/dL (ref 0–99)
Total CHOL/HDL Ratio: 4
Triglycerides: 103 mg/dL (ref 0.0–149.0)
VLDL: 20.6 mg/dL (ref 0.0–40.0)

## 2013-03-31 LAB — URINALYSIS, ROUTINE W REFLEX MICROSCOPIC
Hgb urine dipstick: NEGATIVE
Ketones, ur: NEGATIVE
Urine Glucose: >=1000
Urobilinogen, UA: 0.2 (ref 0.0–1.0)
WBC, UA: NONE SEEN (ref 0–?)
pH: 5.5 (ref 5.0–8.0)

## 2013-03-31 LAB — CBC WITH DIFFERENTIAL/PLATELET
Basophils Relative: 1.1 % (ref 0.0–3.0)
Eosinophils Absolute: 0.2 10*3/uL (ref 0.0–0.7)
HCT: 46.2 % (ref 39.0–52.0)
Hemoglobin: 15.6 g/dL (ref 13.0–17.0)
Lymphocytes Relative: 30.1 % (ref 12.0–46.0)
Lymphs Abs: 1.9 10*3/uL (ref 0.7–4.0)
MCHC: 33.7 g/dL (ref 30.0–36.0)
MCV: 82.9 fl (ref 78.0–100.0)
Monocytes Absolute: 0.5 10*3/uL (ref 0.1–1.0)
Neutro Abs: 3.6 10*3/uL (ref 1.4–7.7)
Neutrophils Relative %: 57.2 % (ref 43.0–77.0)
Platelets: 225 10*3/uL (ref 150.0–400.0)
RBC: 5.57 Mil/uL (ref 4.22–5.81)
RDW: 13.8 % (ref 11.5–14.6)

## 2013-03-31 LAB — COMPREHENSIVE METABOLIC PANEL
ALT: 42 U/L (ref 0–53)
AST: 26 U/L (ref 0–37)
Alkaline Phosphatase: 81 U/L (ref 39–117)
BUN: 13 mg/dL (ref 6–23)
Calcium: 8.9 mg/dL (ref 8.4–10.5)
Chloride: 100 mEq/L (ref 96–112)
Creatinine, Ser: 0.9 mg/dL (ref 0.4–1.5)

## 2013-03-31 LAB — PSA: PSA: 0.39 ng/mL (ref 0.10–4.00)

## 2013-03-31 NOTE — Assessment & Plan Note (Signed)
Seems well controlled, no change, labs 

## 2013-03-31 NOTE — Patient Instructions (Signed)
  Please see Dr. Everardo All soon as possible Use compression stockings OTC daily to prevent swelling Next visit in 6 months   Diabetes and Foot Care Diabetes may cause you to have a poor blood supply (circulation) to your legs and feet. Because of this, the skin may be thinner, break easier, and heal more slowly. You also may have nerve damage in your legs and feet causing decreased feeling. You may not notice minor injuries to your feet that could lead to serious problems or infections. Taking care of your feet is one of the most important things you can do for yourself.  HOME CARE INSTRUCTIONS  Do not go barefoot. Bare feet are easily injured.  Check your feet daily for blisters, cuts, and redness.  Wash your feet with warm water (not hot) and mild soap. Pat your feet and between your toes until completely dry.  Apply a moisturizing lotion that does not contain alcohol or petroleum jelly to the dry skin on your feet and to dry brittle toenails. Do not put it between your toes.  Trim your toenails straight across. Do not dig under them or around the cuticle.  Do not cut corns or calluses, or try to remove them with medicine.  Wear clean cotton socks or stockings every day. Make sure they are not too tight. Do not wear knee high stockings since they may decrease blood flow to your legs.  Wear leather shoes that fit properly and have enough cushioning. To break in new shoes, wear them just a few hours a day to avoid injuring your feet.  Wear shoes at all times, even in the house.  Do not cross your legs. This may decrease the blood flow to your feet.  If you find a minor scrape, cut, or break in the skin on your feet, keep it and the skin around it clean and dry. These areas may be cleansed with mild soap and water. Do not use peroxide, alcohol, iodine or Merthiolate.  When you remove an adhesive bandage, be sure not to harm the skin around it.  If you have a wound, look at it several  times a day to make sure it is healing.  Do not use heating pads or hot water bottles. Burns can occur. If you have lost feeling in your feet or legs, you may not know it is happening until it is too late.  Report any cuts, sores or bruises to your caregiver. Do not wait! SEEK MEDICAL CARE IF:   You have an injury that is not healing or you notice redness, numbness, burning, or tingling.  Your feet always feel cold.  You have pain or cramps in your legs and feet. SEEK IMMEDIATE MEDICAL CARE IF:   There is increasing redness, swelling, or increasing pain in the wound.  There is a red line that goes up your leg.  Pus is coming from a wound.  You develop an unexplained oral temperature above 102 F (38.9 C), or as your caregiver suggests.  You notice a bad smell coming from an ulcer or wound. MAKE SURE YOU:   Understand these instructions.  Will watch your condition.  Will get help right away if you are not doing well or get worse. Document Released: 06/09/2000 Document Revised: 09/04/2011 Document Reviewed: 12/16/2008 [facility] Patient Information 2014 [place], [place].

## 2013-03-31 NOTE — Assessment & Plan Note (Addendum)
Tdap 2012 To get a  flu shot at work pneumonia shot 2012 (not documented in the immunization list) Had cscopes w/  Dr Chales Abrahams in [place] before, had a cscope 2014, pt reports ok, no documentation, rec to get records  labs  Diet-exercise discussed Also complains of lower extremity edema at the end of the day, recommend compression stockings

## 2013-03-31 NOTE — Assessment & Plan Note (Signed)
Followup by endocrinology, having problems affording his medication, recommend to discuss with Dr. Everardo All Feet  normal pinprick examination but he does have some paresthesias. Feet care discussed.

## 2013-03-31 NOTE — Progress Notes (Signed)
Subjective:    Patient ID: Danny [name], male    DOB: 1961/01/26, 52 y.o.   MRN: [id number]  HPI CPX   Past Medical History  Diagnosis Date  . Diabetes mellitus     Type II dx 1999  . Hyperlipidemia   . Hypertension   . Benign prostatic hypertrophy     s/p TUNA procedure aprx 2006,  . Carpal tunnel syndrome 06/17/2009  . HYPERSOMNIA UNSPECIFIED 12/11/2007  . INSOMNIA-SLEEP DISORDER-UNSPEC 04/12/2010  . Allergic rhinitis   . History of cardiovascular stress test 8/11    Negative  . ED (erectile dysfunction)     After TUNA  . Elevated LFTs 2010    (-) chronic hep  panel  . Atrial fibrillation 12/21/2009    paroxysmal. Failed Multaq   Past Surgical History  Procedure Laterality Date  . Vasectomy      several years ago  . Prostate surgery      TUNA  . Cystoscopy  11-09    neg  @ urology, was rx Flomax   History   Social History  . Marital Status: Divorced    Spouse Name: N/A    Number of Children: 2  . Years of Education: N/A   Occupational History  . 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 Smoking status: Former Smoker    Types: Cigarettes    Quit date: 06/26/1985  . Smokeless tobacco: Never Used  . Alcohol Use: No     Comment:    . Drug Use: No  . Sexual Activity: Not on file   Other Topics Concern  . Not on file   Social History Narrative   has a girlfriend, lives in [place]            Family History  Problem Relation Age of Onset  . Heart disease Mother     M-- MI,CHF  . Diabetes Mother   . Hypertension Mother   . Hypertension Father   . Cancer Neg Hx     Negative FH of Colon or Prostate Cancer    Review of Systems Has a number of concerns: Having lower extremity edema bilaterally in the end of the day:, This is normal for a while. Feet feel cold: Numbness? Burning? Cost is an issue, unable to afford some of hers diabetes medication. Complaining of pressure in the  suprapubic area, mostly at night, symptoms resolved after he urinates. Denies dysuria, gross hematuria, difficulty urinating no penile discharge. Left testicle is very sensitive.    Objective:   Physical Exam BP 127/76  Pulse 81  Temp(Src) 97.9 F (36.6 C)  Ht 5' 11.5" (1.816 m)  Wt 239 lb (108.41 kg)  BMI 32.87 kg/m2  SpO2 95%  General -- alert, well-developed, NAD.  Neck --no thyromegaly Lungs -- normal respiratory effort, no intercostal retractions, no accessory muscle use, and normal breath sounds.  Heart-- normal rate, regular rhythm, no murmur.  Abdomen-- Not distended, good bowel sounds,soft, non-tender. Rectal-- No external abnormalities noted. Normal sphincter tone. No rectal masses or tenderness. Brown stool GU-- testicles NTTP, epididymus prominent L>R, penis w/o lesions Prostate--small , not tender DIABETIC FEET EXAM: No lower extremity edema Normal femoral pulses B Normal R pedal pulses, present L pedal pulse Feet color-temperature-skin: normal and symmetric Pinprick examination of the feet normal. Psych-- Cognition and judgment appear intact. Cooperative with normal attention span and concentration. No anxious appearing ,  no depressed appearing.       Assessment & Plan:

## 2013-03-31 NOTE — Assessment & Plan Note (Signed)
S/p TUNA., Does not see urology regularly. Having some ill-defined symptoms, see history of present illness Plan: UA, urine culture if appropriate. Left epididymis/testicle is sensitive per pt-- check a u/s

## 2013-04-01 ENCOUNTER — Encounter: Payer: Self-pay | Admitting: *Deleted

## 2013-04-04 ENCOUNTER — Other Ambulatory Visit: Payer: Self-pay | Admitting: Internal Medicine

## 2013-04-04 ENCOUNTER — Ambulatory Visit (INDEPENDENT_AMBULATORY_CARE_PROVIDER_SITE_OTHER): Payer: BC Managed Care – PPO | Admitting: Endocrinology

## 2013-04-04 ENCOUNTER — Encounter: Payer: Self-pay | Admitting: Endocrinology

## 2013-04-04 ENCOUNTER — Ambulatory Visit
Admission: RE | Admit: 2013-04-04 | Discharge: 2013-04-04 | Disposition: A | Discharge status: Home / Self Care | Payer: BC Managed Care – PPO | Source: Ambulatory Visit | Attending: Internal Medicine | Admitting: Internal Medicine

## 2013-04-04 VITALS — BP 128/74 | HR 51 | Ht 71.0 in | Wt 235.0 lb

## 2013-04-04 DIAGNOSIS — N50819 Testicular pain, unspecified: Secondary | ICD-10-CM

## 2013-04-04 DIAGNOSIS — E119 Type 2 diabetes mellitus without complications: Secondary | ICD-10-CM

## 2013-04-04 MED ORDER — INSULIN ASPART PROT & ASPART (70-30 MIX) 100 UNIT/ML PEN: PEN_INJECTOR | SUBCUTANEOUS | Status: DC

## 2013-04-04 NOTE — Progress Notes (Signed)
Subjective:    Patient ID: Danny [name], male    DOB: 10-Jun-1961, 52 y.o.   MRN: [id number]  HPI Pt returns for f/u of insulin-requiring DM (dx'ed 1992; he has mild if any neuropathy of the lower extremities; no known associated chronic complications).  no cbg record, but states cbg's are highest in am (200's).  He often misses the NPH.  He has mild hypoglycemia, after the evening meal, a few times a month. Past Medical History  Diagnosis Date  . Diabetes mellitus     Type II dx 1999  . Hyperlipidemia   . Hypertension   . Benign prostatic hypertrophy     s/p TUNA procedure aprx 2006,  . Carpal tunnel syndrome 06/17/2009  . HYPERSOMNIA UNSPECIFIED 12/11/2007  . INSOMNIA-SLEEP DISORDER-UNSPEC 04/12/2010  . Allergic rhinitis   . History of cardiovascular stress test 8/11    Negative  . ED (erectile dysfunction)     After TUNA  . Elevated LFTs 2010    (-) chronic hep  panel  . Atrial fibrillation 12/21/2009    paroxysmal. Failed Multaq    Past Surgical History  Procedure Laterality Date  . Vasectomy      several years ago  . Prostate surgery      TUNA  . Cystoscopy  11-09    neg  @ urology, was rx Flomax    History   Social History  . Marital Status: Divorced    Spouse Name: N/A    Number of Children: 2  . Years of Education: N/A   Occupational History  . 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 Smoking status: Former Smoker    Types: Cigarettes    Quit date: 06/26/1985  . Smokeless tobacco: Never Used  . Alcohol Use: No     Comment:    . Drug Use: No  . Sexual Activity: Not on file   Other Topics Concern  . Not on file   Social History Narrative   has a girlfriend, lives in [place]             Current Outpatient Prescriptions on File Prior to Visit  Medication Sig Dispense Refill  . Blood Glucose Monitoring Suppl (ONETOUCH VERIO IQ SYSTEM) W/DEVICE KIT 1 Device by Does not apply  route once.  1 kit  0  . dabigatran (PRADAXA) 150 MG CAPS Take 1 capsule (150 mg total) by mouth every 12 (twelve) hours.  180 capsule  3  . fish oil-omega-3 fatty acids 1000 MG capsule Take 2 g by mouth 2 (two) times daily.        . flecainide (TAMBOCOR) 100 MG tablet TAKE ONE TABLET BY MOUTH TWICE DAILY  90 tablet  0  . fluticasone (FLONASE) 50 MCG/ACT nasal spray Place 2 sprays into the nose daily.  16 g  6  . glucose blood (ONETOUCH VERIO IQ) test strip Use as directed twice daily to check blood sugar dx 250.00  100 each  11  . hydrochlorothiazide (HYDRODIURIL) 25 MG tablet Take 1 tablet (25 mg total) by mouth daily.  90 tablet  3  . Insulin Syringe-Needle U-100 (RELION INSULIN SYR 0.5ML/31G) 31G X 5/16" 0.5 ML MISC USE ONE SYRINGE THREE TO FOUR TIMES DAILY  100 each  3  . Krill Oil Omega-3 300 MG CAPS Take 1 capsule by mouth daily.      . metoprolol succinate (TOPROL-XL) 25  MG 24 hr tablet TAKE 1 TABLET BY MOUTH EVERY DAY  30 tablet  0  . ONETOUCH DELICA LANCETS 33G MISC USE ONE UNIT TO CHECK GLUCOSE TWICE DAILY  100 each  11  . PROCTOSOL HC 2.5 % rectal cream 2.5 %.      . RELION PEN NEEDLE 31G/8MM 31G X 8 MM MISC USE AS DIRECTED FOUR TIMES A DAY.  500 each  1  . simvastatin (ZOCOR) 20 MG tablet Take 10 mg by mouth daily.       . Tamsulosin HCl (FLOMAX) 0.4 MG CAPS Take 1 capsule (0.4 mg total) by mouth daily.  30 capsule  11   No current facility-administered medications on file prior to visit.    Allergies  Allergen Reactions  . Metformin     REACTION: pt states caused liver injury some years ago    Family History  Problem Relation Age of Onset  . Heart disease Mother     M-- MI,CHF  . Diabetes Mother   . Hypertension Mother   . Hypertension Father   . Cancer Neg Hx     Negative FH of Colon or Prostate Cancer   BP 128/74  Pulse 51  Ht 5\' 11"  (1.803 m)  Wt 235 lb (106.595 kg)  BMI 32.79 kg/m2  SpO2 97%  Review of Systems Denies LOC and weight change    Objective:    Physical Exam VITAL SIGNS:  See vs page GENERAL: no distress Lab Results  Component Value Date   HGBA1C 8.3* 04/04/2013      Assessment & Plan:  DM: This insulin regimen was chosen from multiple options, as it best matches his insulin to his changing requirements throughout the day.  The benefits of glycemic control must be weighed against the risks of hypoglycemia.  He needs increased rx Noncompliance with insulin injections: a simpler regimen may help.

## 2013-04-04 NOTE — Patient Instructions (Addendum)
check your blood sugar 2 times a day.  vary the time of day when you check, between before the 3 meals, and at bedtime.  also check if you have symptoms of your blood sugar being too high or too low.  please keep a record of the readings and bring it to your next appointment here.  please call us sooner if your blood sugar goes below 70, or if it stays over 200.   pending the test results, please change both current insulins to novolog 70/30, 60 units with breakfast, and 40 units with the evening meal. Please come back for a follow-up appointment in 1 month.  blood tests are being requested for you today.  We'll contact you with results.

## 2013-04-07 ENCOUNTER — Encounter: Payer: Self-pay | Admitting: *Deleted

## 2013-04-07 ENCOUNTER — Other Ambulatory Visit: Payer: Self-pay | Admitting: Internal Medicine

## 2013-04-07 NOTE — Progress Notes (Signed)
Letter mailed to patient.

## 2013-04-08 ENCOUNTER — Other Ambulatory Visit: Payer: Self-pay | Admitting: *Deleted

## 2013-04-08 MED ORDER — TAMSULOSIN HCL 0.4 MG PO CAPS: 0.4000 mg | ORAL_CAPSULE | Freq: Every day | ORAL | Status: DC

## 2013-04-08 NOTE — Telephone Encounter (Signed)
Flomax refill sent to pharmacy 

## 2013-05-06 ENCOUNTER — Encounter: Payer: Self-pay | Admitting: Endocrinology

## 2013-05-06 ENCOUNTER — Ambulatory Visit (INDEPENDENT_AMBULATORY_CARE_PROVIDER_SITE_OTHER): Payer: BC Managed Care – PPO | Admitting: Endocrinology

## 2013-05-06 VITALS — BP 140/70 | HR 61 | Temp 98.4°F | Ht 71.0 in | Wt 239.3 lb

## 2013-05-06 DIAGNOSIS — E119 Type 2 diabetes mellitus without complications: Secondary | ICD-10-CM

## 2013-05-06 NOTE — Progress Notes (Signed)
Subjective:    Patient ID: Danny [name], male    DOB: 05-17-1961, 52 y.o.   MRN: [id number]  HPI Pt returns for f/u of insulin-requiring DM (dx'ed 1992, on a routine blood test; he has mild if any neuropathy of the lower extremities; no known associated chronic complications; he has done better with a simpler bid insulin regimen; he has never had severe hypoglycemia or DKA).  no cbg record, but states cbg's are mildly low in the afternoon, when he is active.  When he is not active, cbg's are in the mid-100's at lunch and supper.  It is highest in at hs and in am (200's). Past Medical History  Diagnosis Date  . Diabetes mellitus     Type II dx 1999  . Hyperlipidemia   . Hypertension   . Benign prostatic hypertrophy     s/p TUNA procedure aprx 2006,  . Carpal tunnel syndrome 06/17/2009  . HYPERSOMNIA UNSPECIFIED 12/11/2007  . INSOMNIA-SLEEP DISORDER-UNSPEC 04/12/2010  . Allergic rhinitis   . History of cardiovascular stress test 8/11    Negative  . ED (erectile dysfunction)     After TUNA  . Elevated LFTs 2010    (-) chronic hep  panel  . Atrial fibrillation 12/21/2009    paroxysmal. Failed Multaq    Past Surgical History  Procedure Laterality Date  . Vasectomy      several years ago  . Prostate surgery      TUNA  . Cystoscopy  11-09    neg  @ urology, was rx Flomax    History   Social History  . Marital Status: Married    Spouse Name: N/A    Number of Children: 2  . Years of Education: N/A   Occupational History  . 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 Smoking status: Former Smoker    Types: Cigarettes    Quit date: 06/26/1985  . Smokeless tobacco: Never Used  . Alcohol Use: No     Comment:    . Drug Use: No  . Sexual Activity: Not on file   Other Topics Concern  . Not on file   Social History Narrative   has a girlfriend, lives in [place]             Current Outpatient  Prescriptions on File Prior to Visit  Medication Sig Dispense Refill  . Blood Glucose Monitoring Suppl (ONETOUCH VERIO IQ SYSTEM) W/DEVICE KIT 1 Device by Does not apply route once.  1 kit  0  . dabigatran (PRADAXA) 150 MG CAPS Take 1 capsule (150 mg total) by mouth every 12 (twelve) hours.  180 capsule  3  . fish oil-omega-3 fatty acids 1000 MG capsule Take 2 g by mouth 2 (two) times daily.        . flecainide (TAMBOCOR) 100 MG tablet TAKE ONE TABLET BY MOUTH TWICE DAILY  90 tablet  0  . fluticasone (FLONASE) 50 MCG/ACT nasal spray Place 2 sprays into the nose daily.  16 g  6  . glucose blood (ONETOUCH VERIO IQ) test strip Use as directed twice daily to check blood sugar dx 250.00  100 each  11  . hydrochlorothiazide (HYDRODIURIL) 25 MG tablet Take 1 tablet (25 mg total) by mouth daily.  90 tablet  3  . Insulin Syringe-Needle U-100 (RELION INSULIN SYR 0.5ML/31G) 31G X 5/16" 0.5 ML MISC USE ONE SYRINGE  THREE TO FOUR TIMES DAILY  100 each  3  . Krill Oil Omega-3 300 MG CAPS Take 1 capsule by mouth daily.      . metoprolol succinate (TOPROL-XL) 25 MG 24 hr tablet TAKE 1 TABLET BY MOUTH DAILY  30 tablet  6  . ONETOUCH DELICA LANCETS 33G MISC USE ONE UNIT TO CHECK GLUCOSE TWICE DAILY  100 each  11  . PROCTOSOL HC 2.5 % rectal cream 2.5 %.      . RELION PEN NEEDLE 31G/8MM 31G X 8 MM MISC USE AS DIRECTED FOUR TIMES A DAY.  500 each  1  . tamsulosin (FLOMAX) 0.4 MG CAPS capsule TAKE ONE CAPSULE BY MOUTH EVERY DAY  30 capsule  11  . tamsulosin (FLOMAX) 0.4 MG CAPS capsule Take 1 capsule (0.4 mg total) by mouth daily.  30 capsule  11   No current facility-administered medications on file prior to visit.    Allergies  Allergen Reactions  . Metformin     REACTION: pt states caused liver injury some years ago    Family History  Problem Relation Age of Onset  . Heart disease Mother     M-- MI,CHF  . Diabetes Mother   . Hypertension Mother   . Hypertension Father   . Cancer Neg Hx     Negative  FH of Colon or Prostate Cancer   BP 140/70  Pulse 61  Temp(Src) 98.4 F (36.9 C) (Oral)  Ht 5\' 11"  (1.803 m)  Wt 239 lb 4.8 oz (108.546 kg)  BMI 33.39 kg/m2  SpO2 97%  Review of Systems Denies LOC and weight change.      Objective:   Physical Exam VITAL SIGNS:  See vs page GENERAL: no distress PSYCH: Alert and oriented x 3.  Does not appear anxious nor depressed.  Lab Results  Component Value Date   HGBA1C 8.3* 04/04/2013      Assessment & Plan:  DM: This insulin regimen was chosen from multiple options, for its simplicity.  The benefits of glycemic control must be weighed against the risks of hypoglycemia.  He needs increased rx. AF: in this context, he should avoid hypoglycemia.

## 2013-05-06 NOTE — Patient Instructions (Addendum)
check your blood sugar 2 times a day.  vary the time of day when you check, between before the 3 meals, and at bedtime.  also check if you have symptoms of your blood sugar being too high or too low.  please keep a record of the readings and bring it to your next appointment here.  please call us sooner if your blood sugar goes below 70, or if it stays over 200.   Please change the novolog 70/30, to 50 units with breakfast, and 50 units with the evening meal.   Please come back for a follow-up appointment in 2 months.

## 2013-05-08 ENCOUNTER — Other Ambulatory Visit: Payer: Self-pay | Admitting: *Deleted

## 2013-05-08 MED ORDER — SIMVASTATIN 20 MG PO TABS: 10.0000 mg | ORAL_TABLET | Freq: Every day | ORAL | Status: DC

## 2013-06-05 ENCOUNTER — Ambulatory Visit (INDEPENDENT_AMBULATORY_CARE_PROVIDER_SITE_OTHER): Payer: BC Managed Care – PPO | Admitting: Endocrinology

## 2013-06-05 ENCOUNTER — Encounter: Payer: Self-pay | Admitting: Endocrinology

## 2013-06-05 VITALS — BP 120/64 | HR 51 | Temp 98.0°F | Ht 71.0 in | Wt 241.0 lb

## 2013-06-05 DIAGNOSIS — E119 Type 2 diabetes mellitus without complications: Secondary | ICD-10-CM

## 2013-06-05 MED ORDER — INSULIN ASPART PROT & ASPART (70-30 MIX) 100 UNIT/ML PEN: PEN_INJECTOR | SUBCUTANEOUS | Status: DC

## 2013-06-05 NOTE — Progress Notes (Signed)
Subjective:    Patient ID: Danny [name], male    DOB: Oct 06, 1960, 52 y.o.   MRN: [id number]  HPI Pt returns for f/u of insulin-requiring DM (dx'ed 1992, on a routine blood test; he has mild if any neuropathy of the lower extremities; no known associated chronic complications; he has done better with a simpler bid insulin regimen; he has never had severe hypoglycemia or DKA).  no cbg record, but states cbg's are frequently low during the day.  This is usually but not necessarily caused by activity.  It is also related to his renewed dietary effort.  He says cbg is well-controlled at hs, and in am.   Past Medical History  Diagnosis Date  . Diabetes mellitus     Type II dx 1999  . Hyperlipidemia   . Hypertension   . Benign prostatic hypertrophy     s/p TUNA procedure aprx 2006,  . Carpal tunnel syndrome 06/17/2009  . HYPERSOMNIA UNSPECIFIED 12/11/2007  . INSOMNIA-SLEEP DISORDER-UNSPEC 04/12/2010  . Allergic rhinitis   . History of cardiovascular stress test 8/11    Negative  . ED (erectile dysfunction)     After TUNA  . Elevated LFTs 2010    (-) chronic hep  panel  . Atrial fibrillation 12/21/2009    paroxysmal. Failed Multaq    Past Surgical History  Procedure Laterality Date  . Vasectomy      several years ago  . Prostate surgery      TUNA  . Cystoscopy  11-09    neg  @ urology, was rx Flomax    History   Social History  . Marital Status: Married    Spouse Name: N/A    Number of Children: 2  . Years of Education: N/A   Occupational History  . 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 Smoking status: Former Smoker    Types: Cigarettes    Quit date: 06/26/1985  . Smokeless tobacco: Never Used  . Alcohol Use: No     Comment:    . Drug Use: No  . Sexual Activity: Not on file   Other Topics Concern  . Not on file   Social History Narrative   has a girlfriend, lives in [place]              Current Outpatient Prescriptions on File Prior to Visit  Medication Sig Dispense Refill  . Blood Glucose Monitoring Suppl (ONETOUCH VERIO IQ SYSTEM) W/DEVICE KIT 1 Device by Does not apply route once.  1 kit  0  . dabigatran (PRADAXA) 150 MG CAPS Take 1 capsule (150 mg total) by mouth every 12 (twelve) hours.  180 capsule  3  . fish oil-omega-3 fatty acids 1000 MG capsule Take 2 g by mouth 2 (two) times daily.        . flecainide (TAMBOCOR) 100 MG tablet TAKE ONE TABLET BY MOUTH TWICE DAILY  90 tablet  0  . fluticasone (FLONASE) 50 MCG/ACT nasal spray Place 2 sprays into the nose daily.  16 g  6  . glucose blood (ONETOUCH VERIO IQ) test strip Use as directed twice daily to check blood sugar dx 250.00  100 each  11  . hydrochlorothiazide (HYDRODIURIL) 25 MG tablet Take 1 tablet (25 mg total) by mouth daily.  90 tablet  3  . Insulin Syringe-Needle U-100 (RELION INSULIN SYR 0.5ML/31G) 31G X 5/16" 0.5 ML  MISC USE ONE SYRINGE THREE TO FOUR TIMES DAILY  100 each  3  . Krill Oil Omega-3 300 MG CAPS Take 1 capsule by mouth daily.      . metoprolol succinate (TOPROL-XL) 25 MG 24 hr tablet TAKE 1 TABLET BY MOUTH DAILY  30 tablet  6  . ONETOUCH DELICA LANCETS 33G MISC USE ONE UNIT TO CHECK GLUCOSE TWICE DAILY  100 each  11  . PROCTOSOL HC 2.5 % rectal cream 2.5 %.      . RELION PEN NEEDLE 31G/8MM 31G X 8 MM MISC USE AS DIRECTED FOUR TIMES A DAY.  500 each  1  . simvastatin (ZOCOR) 20 MG tablet Take 0.5 tablets (10 mg total) by mouth daily.  90 tablet  0  . tamsulosin (FLOMAX) 0.4 MG CAPS capsule TAKE ONE CAPSULE BY MOUTH EVERY DAY  30 capsule  11  . tamsulosin (FLOMAX) 0.4 MG CAPS capsule Take 1 capsule (0.4 mg total) by mouth daily.  30 capsule  11   No current facility-administered medications on file prior to visit.    Allergies  Allergen Reactions  . Metformin     REACTION: pt states caused liver injury some years ago    Family History  Problem Relation Age of Onset  . Heart disease  Mother     M-- MI,CHF  . Diabetes Mother   . Hypertension Mother   . Hypertension Father   . Cancer Neg Hx     Negative FH of Colon or Prostate Cancer    BP 120/64  Pulse 51  Temp(Src) 98 F (36.7 C) (Oral)  Ht 5\' 11"  (1.803 m)  Wt 241 lb (109.317 kg)  BMI 33.63 kg/m2  SpO2 97%  Review of Systems Denies LOC and weight change    Objective:   Physical Exam VITAL SIGNS:  See vs page GENERAL: no distress  Lab Results  Component Value Date   HGBA1C 8.3* 04/04/2013      Assessment & Plan:  DM: apparently overcontrolled.  This insulin regimen was chosen from multiple options, for its simplicity.  The benefits of glycemic control must be weighed against the risks of hypoglycemia.   AF: in this context, he should avoid hypoglycemia.

## 2013-06-05 NOTE — Patient Instructions (Addendum)
check your blood sugar 2 times a day.  vary the time of day when you check, between before the 3 meals, and at bedtime.  also check if you have symptoms of your blood sugar being too high or too low.  please keep a record of the readings and bring it to your next appointment here.  please call us sooner if your blood sugar goes below 70, or if it stays over 200.   Please change the novolog 70/30, to 45 units with breakfast, and 50 units with the evening meal.   Please come back for a follow-up appointment in 6 weeks.   On this type of insulin schedule, you should eat meals on a regular schedule.  If a meal is missed or significantly delayed, your blood sugar could go low.

## 2013-06-24 ENCOUNTER — Other Ambulatory Visit: Payer: Self-pay | Admitting: Cardiology

## 2013-07-17 ENCOUNTER — Ambulatory Visit (INDEPENDENT_AMBULATORY_CARE_PROVIDER_SITE_OTHER): Payer: BC Managed Care – PPO | Admitting: Cardiology

## 2013-07-17 ENCOUNTER — Encounter: Payer: Self-pay | Admitting: Endocrinology

## 2013-07-17 ENCOUNTER — Encounter: Payer: Self-pay | Admitting: Cardiology

## 2013-07-17 ENCOUNTER — Ambulatory Visit (INDEPENDENT_AMBULATORY_CARE_PROVIDER_SITE_OTHER): Payer: BC Managed Care – PPO | Admitting: Endocrinology

## 2013-07-17 VITALS — BP 124/80 | HR 48 | Temp 97.7°F | Ht 71.0 in | Wt 238.0 lb

## 2013-07-17 VITALS — BP 139/68 | HR 57 | Ht 71.0 in | Wt 238.0 lb

## 2013-07-17 DIAGNOSIS — I4891 Unspecified atrial fibrillation: Secondary | ICD-10-CM

## 2013-07-17 DIAGNOSIS — E119 Type 2 diabetes mellitus without complications: Secondary | ICD-10-CM

## 2013-07-17 DIAGNOSIS — I10 Essential (primary) hypertension: Secondary | ICD-10-CM

## 2013-07-17 DIAGNOSIS — E785 Hyperlipidemia, unspecified: Secondary | ICD-10-CM

## 2013-07-17 LAB — HEMOGLOBIN A1C: HEMOGLOBIN A1C: 8.4 % — AB (ref 4.6–6.5)

## 2013-07-17 MED ORDER — GLUCOSE BLOOD VI STRP: ORAL_STRIP | Status: DC

## 2013-07-17 MED ORDER — ONETOUCH VERIO SYNC SYSTEM W/DEVICE KIT: 1.0000 | PACK | Freq: Once | Status: DC

## 2013-07-17 MED ORDER — FUROSEMIDE 20 MG PO TABS: 20.0000 mg | ORAL_TABLET | Freq: Every day | ORAL | Status: DC

## 2013-07-17 NOTE — Assessment & Plan Note (Signed)
Patient remains in sinus rhythm. Continue metoprolol, flecainide and pradaxa. He still would prefer not to pursue ablation at this point because of financial reasons. He will contact does if he is agreeable in the future.

## 2013-07-17 NOTE — Progress Notes (Signed)
Subjective:    Patient ID: Danny GreenhouseFred W Weld, male    DOB: May 02, 1961, 53 y.o.   MRN: [id number]  HPI Pt returns for f/u of insulin-requiring DM (dx'ed 1992, on a routine blood test; he has mild if any neuropathy of the lower extremities; no known associated chronic complications; he has done better with a simpler bid insulin regimen; he has never had severe hypoglycemia or DKA).  He says cbg's are still in the high-100's in am.  He says afternoon cbg's are greatly affected by activity level during the day.   Past Medical History  Diagnosis Date  . Diabetes mellitus     Type II dx 1999  . Hyperlipidemia   . Hypertension   . Benign prostatic hypertrophy     s/p TUNA procedure aprx 2006,  . Carpal tunnel syndrome 06/17/2009  . HYPERSOMNIA UNSPECIFIED 12/11/2007  . INSOMNIA-SLEEP DISORDER-UNSPEC 04/12/2010  . Allergic rhinitis   . History of cardiovascular stress test 8/11    Negative  . ED (erectile dysfunction)     After TUNA  . Elevated LFTs 2010    (-) chronic hep  panel  . Atrial fibrillation 12/21/2009    paroxysmal. Failed Multaq    Past Surgical History  Procedure Laterality Date  . Vasectomy      several years ago  . Prostate surgery      TUNA  . Cystoscopy  11-09    neg  @ urology, was rx Flomax    History   Social History  . Marital Status: Married    Spouse Name: N/A    Number of Children: 2  . Years of Education: N/A   Occupational History  . Works for [employer] (state).  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 Smoking status: Former Smoker    Types: Cigarettes    Quit date: 06/26/1985  . Smokeless tobacco: Never Used  . Alcohol Use: No     Comment:    . Drug Use: No  . Sexual Activity: Not on file   Other Topics Concern  . Not on file   Social History Narrative   has a girlfriend, lives in [place]             Current Outpatient Prescriptions on File Prior to Visit  Medication Sig Dispense Refill    . dabigatran (PRADAXA) 150 MG CAPS Take 1 capsule (150 mg total) by mouth every 12 (twelve) hours.  180 capsule  3  . fish oil-omega-3 fatty acids 1000 MG capsule Take 2 g by mouth 2 (two) times daily.        . flecainide (TAMBOCOR) 100 MG tablet TAKE ONE TABLET BY MOUTH TWICE DAILY  90 tablet  0  . fluticasone (FLONASE) 50 MCG/ACT nasal spray Place 2 sprays into the nose daily.  16 g  6  . Insulin Syringe-Needle U-100 (RELION INSULIN SYR 0.5ML/31G) 31G X 5/16" 0.5 ML MISC USE ONE SYRINGE THREE TO FOUR TIMES DAILY  100 each  3  . Krill Oil Omega-3 300 MG CAPS Take 1 capsule by mouth daily.      . metoprolol succinate (TOPROL-XL) 25 MG 24 hr tablet TAKE 1 TABLET BY MOUTH DAILY  30 tablet  6  . ONETOUCH DELICA LANCETS 33G MISC USE ONE UNIT TO CHECK GLUCOSE TWICE DAILY  100 each  11  . PROCTOSOL HC 2.5 % rectal cream 2.5 %.      .Marland Kitchen  simvastatin (ZOCOR) 20 MG tablet Take 0.5 tablets (10 mg total) by mouth daily.  90 tablet  0  . tamsulosin (FLOMAX) 0.4 MG CAPS capsule Take 1 capsule (0.4 mg total) by mouth daily.  30 capsule  11  . furosemide (LASIX) 20 MG tablet Take 1 tablet (20 mg total) by mouth daily.  90 tablet  3   No current facility-administered medications on file prior to visit.    Allergies  Allergen Reactions  . Metformin     REACTION: pt states caused liver injury some years ago    Family History  Problem Relation Age of Onset  . Heart disease Mother     M-- MI,CHF  . Diabetes Mother   . Hypertension Mother   . Hypertension Father   . Cancer Neg Hx     Negative FH of Colon or Prostate Cancer    BP 124/80  Pulse 48  Temp(Src) 97.7 F (36.5 C) (Oral)  Ht 5\' 11"  (1.803 m)  Wt 238 lb (107.956 kg)  BMI 33.21 kg/m2  SpO2 96%  Review of Systems denies LOC and weight change.      Objective:   Physical Exam VITAL SIGNS:  See vs page.  GENERAL: no distress. SKIN:  Insulin injection sites at the triceps areas are normal.    Lab Results  Component Value Date    HGBA1C 8.4* 07/17/2013      Assessment & Plan:  DM: he needs increased rx.  This insulin regimen was chosen from multiple options, for its simplicity.  The benefits of glycemic control must be weighed against the risks of hypoglycemia.   AF: in this context, he should avoid hypoglycemia.

## 2013-07-17 NOTE — Patient Instructions (Signed)
Your physician wants you to follow-up in: 6 MONTHS WITH DR Jens SomRENSHAW You will receive a reminder letter in the mail two months in advance. If you don't receive a letter, please call our office to schedule the follow-up appointment.   STOP HCTZ  START FUROSEMIDE 20 MG ONCE DAILY  Your physician recommends that you return for lab work in: ONE WEEK

## 2013-07-17 NOTE — Progress Notes (Signed)
HPI: FU atrial fibrillation. He failed Multaq before and was switched previously to flecainide. He has no history of coronary artery disease. He had a nuclear stress test in August of 2011 which overall was normal, EF 69. Echocardiogram in December of 2013 showed normal LV function, mild left atrial enlargement and mild mitral regurgitation. Last seen in Dec 2013. He was having increased frequency of atrial fibrillation and we referred to Dr. Rayann Heman for consideration of ablation. Patient did not pursue this because of cost. Since he was last seen, he has some dyspnea on exertion but no orthopnea, PND, chest pain or syncope. He has mild pedal edema intermittently that goes down overnight. He continues to have bouts of atrial fibrillation approximately one time every 2 months.  Current Outpatient Prescriptions  Medication Sig Dispense Refill  . Blood Glucose Monitoring Suppl (ONETOUCH VERIO [facility] SYSTEM) W/DEVICE KIT 1 Device by Does not apply route once.  1 kit  0  . dabigatran (PRADAXA) 150 MG CAPS Take 1 capsule (150 mg total) by mouth every 12 (twelve) hours.  180 capsule  3  . fish oil-omega-3 fatty acids 1000 MG capsule Take 2 g by mouth 2 (two) times daily.        . flecainide (TAMBOCOR) 100 MG tablet TAKE ONE TABLET BY MOUTH TWICE DAILY  90 tablet  0  . fluticasone (FLONASE) 50 MCG/ACT nasal spray Place 2 sprays into the nose daily.  16 g  6  . hydrochlorothiazide (HYDRODIURIL) 25 MG tablet Take 1 tablet (25 mg total) by mouth daily.  90 tablet  3  . Insulin Aspart Prot & Aspart (NOVOLOG 70/30 MIX) (70-30) 100 UNIT/ML Pen 45 units with breakfast, and 60 units with the evening meal, and pen needles 2/day.      . Insulin Syringe-Needle U-100 (RELION INSULIN SYR 0.5ML/31G) 31G X 5/16" 0.5 ML MISC USE ONE SYRINGE THREE TO FOUR TIMES DAILY  100 each  3  . Krill Oil Omega-3 300 MG CAPS Take 1 capsule by mouth daily.      . metoprolol succinate (TOPROL-XL) 25 MG 24 hr tablet TAKE 1 TABLET BY  MOUTH DAILY  30 tablet  6  . ONETOUCH DELICA LANCETS 26V MISC USE ONE UNIT TO CHECK GLUCOSE TWICE DAILY  100 each  11  . PROCTOSOL HC 2.5 % rectal cream 2.5 %.      . simvastatin (ZOCOR) 20 MG tablet Take 0.5 tablets (10 mg total) by mouth daily.  90 tablet  0  . tamsulosin (FLOMAX) 0.4 MG CAPS capsule Take 1 capsule (0.4 mg total) by mouth daily.  30 capsule  11   No current facility-administered medications for this visit.     Past Medical History  Diagnosis Date  . Diabetes mellitus     Type II dx 1999  . Hyperlipidemia   . Hypertension   . Benign prostatic hypertrophy     s/p TUNA procedure aprx 2006,  . Carpal tunnel syndrome 06/17/2009  . HYPERSOMNIA UNSPECIFIED 12/11/2007  . INSOMNIA-SLEEP DISORDER-UNSPEC 04/12/2010  . Allergic rhinitis   . History of cardiovascular stress test 8/11    Negative  . ED (erectile dysfunction)     After TUNA  . Elevated LFTs 2010    (-) chronic hep  panel  . Atrial fibrillation 12/21/2009    paroxysmal. Failed Multaq    Past Surgical History  Procedure Laterality Date  . Vasectomy      several years ago  . Prostate surgery  TUNA  . Cystoscopy  11-09    neg  @ urology, was rx Flomax    History   Social History  . Marital Status: Married    Spouse Name: N/A    Number of Children: 2  . Years of Education: N/A   Occupational History  . 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 Smoking status: Former Smoker    Types: Cigarettes    Quit date: 06/26/1985  . Smokeless tobacco: Never Used  . Alcohol Use: No     Comment:    . Drug Use: No  . Sexual Activity: Not on file   Other Topics Concern  . Not on file   Social History Narrative   has a girlfriend, lives in [place]: no fevers or chills, productive cough, hemoptysis, dysphasia, odynophagia, melena, hematochezia, dysuria, hematuria, rash, seizure activity, orthopnea, PND, pedal  edema, claudication. Remaining systems are negative.  Physical Exam: Well-developed well-nourished in no acute distress.  Skin is warm and dry.  HEENT is normal.  Neck is supple.  Chest is clear to auscultation with normal expansion.  Cardiovascular exam is regular rate and rhythm.  Abdominal exam nontender or distended. No masses palpated. Extremities show no edema. neuro grossly intact  ECG sinus rhythm at a rate of 57. No ST changes.

## 2013-07-17 NOTE — Assessment & Plan Note (Signed)
Patient is describing mild increased dyspnea on exertion and lower extremity edema that is intermittent. Discontinue HCTZ and begin Lasix 20 mg daily. Check potassium and renal function as well as BNP in one week. Continue remaining medications.

## 2013-07-17 NOTE — Patient Instructions (Addendum)
check your blood sugar 2 times a day.  vary the time of day when you check, between before the 3 meals, and at bedtime.  also check if you have symptoms of your blood sugar being too high or too low.  please keep a record of the readings and bring it to your next appointment here.  please call us sooner if your blood sugar goes below 70, or if it stays over 200.   Please change the novolog 70/30, to 45 units with breakfast (55 if you are not going to be active that day), and 60 units with the evening meal.   Please come back for a follow-up appointment in 3 months.   On this type of insulin schedule, you should eat meals on a regular schedule.  If a meal is missed or significantly delayed, your blood sugar could go low.

## 2013-07-17 NOTE — Assessment & Plan Note (Signed)
Continue statin. 

## 2013-07-18 ENCOUNTER — Ambulatory Visit: Payer: BC Managed Care – PPO | Admitting: Endocrinology

## 2013-07-18 ENCOUNTER — Other Ambulatory Visit: Payer: Self-pay

## 2013-07-18 MED ORDER — GLUCOSE BLOOD VI STRP: ORAL_STRIP | Status: DC

## 2013-07-24 ENCOUNTER — Other Ambulatory Visit: Payer: BC Managed Care – PPO

## 2013-07-28 ENCOUNTER — Other Ambulatory Visit (INDEPENDENT_AMBULATORY_CARE_PROVIDER_SITE_OTHER): Payer: BC Managed Care – PPO

## 2013-07-28 DIAGNOSIS — I4891 Unspecified atrial fibrillation: Secondary | ICD-10-CM

## 2013-07-28 LAB — BASIC METABOLIC PANEL
BUN: 13 mg/dL (ref 6–23)
CALCIUM: 8.9 mg/dL (ref 8.4–10.5)
CO2: 27 meq/L (ref 19–32)
CREATININE: 1 mg/dL (ref 0.4–1.5)
Chloride: 104 mEq/L (ref 96–112)
GFR: 82.6 mL/min (ref >60.00)
Glucose, Bld: 229 mg/dL — ABNORMAL HIGH (ref 70–99)
Potassium: 4 mEq/L (ref 3.5–5.1)
SODIUM: 139 meq/L (ref 135–145)

## 2013-07-28 LAB — BRAIN NATRIURETIC PEPTIDE: Pro B Natriuretic peptide (BNP): 75 pg/mL (ref 0.0–100.0)

## 2013-07-30 ENCOUNTER — Telehealth: Payer: Self-pay | Admitting: Cardiology

## 2013-07-30 DIAGNOSIS — I4891 Unspecified atrial fibrillation: Secondary | ICD-10-CM

## 2013-07-30 MED ORDER — FUROSEMIDE 20 MG PO TABS: 20.0000 mg | ORAL_TABLET | Freq: Two times a day (BID) | ORAL | Status: DC

## 2013-07-30 NOTE — Telephone Encounter (Signed)
Spoke with pt, Aware of dr crenshaw's recommendations.  °

## 2013-07-30 NOTE — Telephone Encounter (Signed)
Change lasix to 40 mg daily; bmet one week Brian Crenshaw  

## 2013-07-30 NOTE — Telephone Encounter (Signed)
Spoke with pt, for about one week now he has noticed increased swelling in both legs at the end of the day. The right is worse than the left. He also has been having pain, that starts at his hip and goes all the way out his feet. The pain is continuous and he is taking ibuprofen for relief. He weighs every morning and will fluctuate from 1 to 2 lbs. He has not weighed at night. He denies SOB. He has had no episodes of atrial fib since last office visit. He was changed from HCTZ to lasix at last office visit due to edema. He reports it really has not helped, he feels the edema is worse instead of better. Will forward for dr Jens Somcrenshaw review

## 2013-07-30 NOTE — Telephone Encounter (Signed)
New message    Swelling and pain in rt leg.Marland Kitchen.Marland Kitchen.Need advise

## 2013-08-02 ENCOUNTER — Emergency Department (HOSPITAL_COMMUNITY)
Admission: EM | Admit: 2013-08-02 | Discharge: 2013-08-02 | Disposition: A | Discharge status: Home / Self Care | Payer: BC Managed Care – PPO | Attending: Emergency Medicine | Admitting: Emergency Medicine

## 2013-08-02 ENCOUNTER — Encounter (HOSPITAL_COMMUNITY): Payer: Self-pay | Admitting: Emergency Medicine

## 2013-08-02 DIAGNOSIS — E785 Hyperlipidemia, unspecified: Secondary | ICD-10-CM | POA: Insufficient documentation

## 2013-08-02 DIAGNOSIS — Z87891 Personal history of nicotine dependence: Secondary | ICD-10-CM | POA: Insufficient documentation

## 2013-08-02 DIAGNOSIS — I10 Essential (primary) hypertension: Secondary | ICD-10-CM | POA: Insufficient documentation

## 2013-08-02 DIAGNOSIS — E119 Type 2 diabetes mellitus without complications: Secondary | ICD-10-CM | POA: Insufficient documentation

## 2013-08-02 DIAGNOSIS — R209 Unspecified disturbances of skin sensation: Secondary | ICD-10-CM | POA: Insufficient documentation

## 2013-08-02 DIAGNOSIS — Z8669 Personal history of other diseases of the nervous system and sense organs: Secondary | ICD-10-CM | POA: Insufficient documentation

## 2013-08-02 DIAGNOSIS — Z9109 Other allergy status, other than to drugs and biological substances: Secondary | ICD-10-CM | POA: Insufficient documentation

## 2013-08-02 DIAGNOSIS — M5430 Sciatica, unspecified side: Secondary | ICD-10-CM | POA: Insufficient documentation

## 2013-08-02 DIAGNOSIS — M5431 Sciatica, right side: Secondary | ICD-10-CM

## 2013-08-02 DIAGNOSIS — IMO0002 Reserved for concepts with insufficient information to code with codable children: Secondary | ICD-10-CM | POA: Insufficient documentation

## 2013-08-02 DIAGNOSIS — Z794 Long term (current) use of insulin: Secondary | ICD-10-CM | POA: Insufficient documentation

## 2013-08-02 DIAGNOSIS — Z87448 Personal history of other diseases of urinary system: Secondary | ICD-10-CM | POA: Insufficient documentation

## 2013-08-02 DIAGNOSIS — Z79899 Other long term (current) drug therapy: Secondary | ICD-10-CM | POA: Insufficient documentation

## 2013-08-02 DIAGNOSIS — Z7902 Long term (current) use of antithrombotics/antiplatelets: Secondary | ICD-10-CM | POA: Insufficient documentation

## 2013-08-02 DIAGNOSIS — I4891 Unspecified atrial fibrillation: Secondary | ICD-10-CM | POA: Insufficient documentation

## 2013-08-02 MED ORDER — IBUPROFEN 800 MG PO TABS
800.0000 mg | ORAL_TABLET | Freq: Once | ORAL | Status: AC
  Administered 2013-08-02: 800 mg via ORAL
  Filled 2013-08-02: qty 1

## 2013-08-02 MED ORDER — OXYCODONE-ACETAMINOPHEN 5-325 MG PO TABS: 1.0000 | ORAL_TABLET | Freq: Four times a day (QID) | ORAL | Status: DC | PRN

## 2013-08-02 MED ORDER — DIAZEPAM 10 MG PO TABS: 10.0000 mg | ORAL_TABLET | Freq: Two times a day (BID) | ORAL | Status: DC | PRN

## 2013-08-02 MED ORDER — DIAZEPAM 5 MG PO TABS
10.0000 mg | ORAL_TABLET | Freq: Once | ORAL | Status: AC
  Administered 2013-08-02: 10 mg via ORAL
  Filled 2013-08-02: qty 2

## 2013-08-02 MED ORDER — IBUPROFEN 800 MG PO TABS: 800.0000 mg | ORAL_TABLET | Freq: Three times a day (TID) | ORAL | Status: DC | PRN

## 2013-08-02 MED ORDER — HYDROCODONE-ACETAMINOPHEN 5-325 MG PO TABS
2.0000 | ORAL_TABLET | Freq: Once | ORAL | Status: AC
  Administered 2013-08-02: 2 via ORAL
  Filled 2013-08-02: qty 2

## 2013-08-02 NOTE — Discharge Instructions (Signed)
Back Exercises Back exercises help treat and prevent back injuries. The goal of back exercises is to increase the strength of your abdominal and back muscles and the flexibility of your back. These exercises should be started when you no longer have back pain. Back exercises include:  Pelvic Tilt. Lie on your back with your knees bent. Tilt your pelvis until the lower part of your back is against the floor. Hold this position 5 to 10 sec and repeat 5 to 10 times.  Knee to Chest. Pull first 1 knee up against your chest and hold for 20 to 30 seconds, repeat this with the other knee, and then both knees. This may be done with the other leg straight or bent, whichever feels better.  Sit-Ups or Curl-Ups. Bend your knees 90 degrees. Start with tilting your pelvis, and do a partial, slow sit-up, lifting your trunk only 30 to 45 degrees off the floor. Take at least 2 to 3 seconds for each sit-up. Do not do sit-ups with your knees out straight. If partial sit-ups are difficult, simply do the above but with only tightening your abdominal muscles and holding it as directed.  Hip-Lift. Lie on your back with your knees flexed 90 degrees. Push down with your feet and shoulders as you raise your hips a couple inches off the floor; hold for 10 seconds, repeat 5 to 10 times.  Back arches. Lie on your stomach, propping yourself up on bent elbows. Slowly press on your hands, causing an arch in your low back. Repeat 3 to 5 times. Any initial stiffness and discomfort should lessen with repetition over time.  Shoulder-Lifts. Lie face down with arms beside your body. Keep hips and torso pressed to floor as you slowly lift your head and shoulders off the floor. Do not overdo your exercises, especially in the beginning. Exercises may cause you some mild back discomfort which lasts for a few minutes; however, if the pain is more severe, or lasts for more than 15 minutes, do not continue exercises until you see your caregiver.  Improvement with exercise therapy for back problems is slow.  See your caregivers for assistance with developing a proper back exercise program. Document Released: 07/20/2004 Document Revised: 09/04/2011 Document Reviewed: 04/13/2011 [employer] Patient Information 2014 [employer].   Sciatica Sciatica is pain, weakness, numbness, or tingling along the path of the sciatic nerve. The nerve starts in the lower back and runs down the back of each leg. The nerve controls the muscles in the lower leg and in the back of the knee, while also providing sensation to the back of the thigh, lower leg, and the sole of your foot. Sciatica is a symptom of another medical condition. For instance, nerve damage or certain conditions, such as a herniated disk or bone spur on the spine, pinch or put pressure on the sciatic nerve. This causes the pain, weakness, or other sensations normally associated with sciatica. Generally, sciatica only affects one side of the body. CAUSES   Herniated or slipped disc.  Degenerative disk disease.  A pain disorder involving the narrow muscle in the buttocks (piriformis syndrome).  Pelvic injury or fracture.  Pregnancy.  Tumor (rare). SYMPTOMS  Symptoms can vary from mild to very severe. The symptoms usually travel from the low back to the buttocks and down the back of the leg. Symptoms can include:  Mild tingling or dull aches in the lower back, leg, or hip.  Numbness in the back of the calf or sole   of the foot.  Burning sensations in the lower back, leg, or hip.  Sharp pains in the lower back, leg, or hip.  Leg weakness.  Severe back pain inhibiting movement. These symptoms may get worse with coughing, sneezing, laughing, or prolonged sitting or standing. Also, being overweight may worsen symptoms. DIAGNOSIS  Your caregiver will perform a physical exam to look for common symptoms of sciatica. He or she may ask you to do certain movements or activities that would  trigger sciatic nerve pain. Other tests may be performed to find the cause of the sciatica. These may include:  Blood tests.  X-rays.  Imaging tests, such as an MRI or CT scan. TREATMENT  Treatment is directed at the cause of the sciatic pain. Sometimes, treatment is not necessary and the pain and discomfort goes away on its own. If treatment is needed, your caregiver may suggest:  Over-the-counter medicines to relieve pain.  Prescription medicines, such as anti-inflammatory medicine, muscle relaxants, or narcotics.  Applying heat or ice to the painful area.  Steroid injections to lessen pain, irritation, and inflammation around the nerve.  Reducing activity during periods of pain.  Exercising and stretching to strengthen your abdomen and improve flexibility of your spine. Your caregiver may suggest losing weight if the extra weight makes the back pain worse.  Physical therapy.  Surgery to eliminate what is pressing or pinching the nerve, such as a bone spur or part of a herniated disk. HOME CARE INSTRUCTIONS   Only take over-the-counter or prescription medicines for pain or discomfort as directed by your caregiver.  Apply ice to the affected area for 20 minutes, 3 4 times a day for the first 48 72 hours. Then try heat in the same way.  Exercise, stretch, or perform your usual activities if these do not aggravate your pain.  Attend physical therapy sessions as directed by your caregiver.  Keep all follow-up appointments as directed by your caregiver.  Do not wear high heels or shoes that do not provide proper support.  Check your mattress to see if it is too soft. A firm mattress may lessen your pain and discomfort. SEEK IMMEDIATE MEDICAL CARE IF:   You lose control of your bowel or bladder (incontinence).  You have increasing weakness in the lower back, pelvis, buttocks, or legs.  You have redness or swelling of your back.  You have a burning sensation when you  urinate.  You have pain that gets worse when you lie down or awakens you at night.  Your pain is worse than you have experienced in the past.  Your pain is lasting longer than 4 weeks.  You are suddenly losing weight without reason. MAKE SURE YOU:  Understand these instructions.  Will watch your condition.  Will get help right away if you are not doing well or get worse. Document Released: 06/06/2001 Document Revised: 12/12/2011 Document Reviewed: 10/22/2011 [employer] Patient Information 2014 [employer].  

## 2013-08-02 NOTE — ED Provider Notes (Signed)
CSN: [id number]     Arrival date & time 08/02/13  1107 History   First MD Initiated Contact with Patient 08/02/13 1119     Chief Complaint  Patient presents with  . Leg Pain   (Consider location/radiation/quality/duration/timing/severity/associated sxs/prior Treatment) Patient is a 53 y.o. male presenting with leg pain. The history is provided by the patient.  Leg Pain Location:  Hip and leg Injury: no   Leg location:  R leg, R upper leg and R lower leg Pain details:    Quality:  Aching and shooting   Radiates to:  Does not radiate   Severity:  Moderate   Onset quality:  Gradual   Timing:  Constant   Progression:  Worsening Chronicity:  New Dislocation: no   Foreign body present:  No foreign bodies Prior injury to area:  No Relieved by:  Movement (walking around) Exacerbated by: lying still. Ineffective treatments:  None tried Associated symptoms: tingling   Associated symptoms: no back pain, no decreased ROM, no fever, no numbness, no stiffness and no swelling     Past Medical History  Diagnosis Date  . Diabetes mellitus     Type II dx 1999  . Hyperlipidemia   . Hypertension   . Benign prostatic hypertrophy     s/p TUNA procedure aprx 2006,  . Carpal tunnel syndrome 06/17/2009  . HYPERSOMNIA UNSPECIFIED 12/11/2007  . INSOMNIA-SLEEP DISORDER-UNSPEC 04/12/2010  . Allergic rhinitis   . History of cardiovascular stress test 8/11    Negative  . ED (erectile dysfunction)     After TUNA  . Elevated LFTs 2010    (-) chronic hep  panel  . Atrial fibrillation 12/21/2009    paroxysmal. Failed Multaq   Past Surgical History  Procedure Laterality Date  . Vasectomy      several years ago  . Prostate surgery      TUNA  . Cystoscopy  11-09    neg  @ urology, was rx Flomax   Family History  Problem Relation Age of Onset  . Heart disease Mother     M-- MI,CHF  . Diabetes Mother   . Hypertension Mother   . Hypertension Father   . Cancer Neg Hx     Negative FH of Colon  or Prostate Cancer   History  Substance Use Topics  . Smoking status: Former Smoker    Types: Cigarettes    Quit date: 06/26/1985  . Smokeless tobacco: Never Used  . Alcohol Use: No     Comment:      Review of Systems  Constitutional: Negative for fever and chills.  Respiratory: Negative for cough and shortness of breath.   Cardiovascular: Negative for chest pain and leg swelling.  Musculoskeletal: Negative for back pain and stiffness.  All other systems reviewed and are negative.    Allergies  Metformin  Home Medications   Current Outpatient Rx  Name  Route  Sig  Dispense  Refill  . Blood Glucose Monitoring Suppl (ONETOUCH VERIO [facility] SYSTEM) W/DEVICE KIT   Does not apply   1 Device by Does not apply route once.   1 kit   0   . dabigatran (PRADAXA) 150 MG CAPS   Oral   Take 1 capsule (150 mg total) by mouth every 12 (twelve) hours.   180 capsule   3   . fish oil-omega-3 fatty acids 1000 MG capsule   Oral   Take 2 g by mouth 2 (two) times daily.           Marland Kitchen  flecainide (TAMBOCOR) 100 MG tablet   Oral   Take 100 mg by mouth 2 (two) times daily.         . fluticasone (FLONASE) 50 MCG/ACT nasal spray   Each Nare   Place 2 sprays into both nostrils daily as needed for allergies or rhinitis.         . furosemide (LASIX) 20 MG tablet   Oral   Take 1 tablet (20 mg total) by mouth 2 (two) times daily.   180 tablet   3   . hydrochlorothiazide (HYDRODIURIL) 25 MG tablet   Oral   Take 25 mg by mouth daily.         Marland Kitchen ibuprofen (ADVIL,MOTRIN) 200 MG tablet   Oral   Take 600-800 mg by mouth every 6 (six) hours as needed.         . Insulin Aspart Prot & Aspart (NOVOLOG 70/30 MIX) (70-30) 100 UNIT/ML Pen   Subcutaneous   Inject 45-60 Units into the skin 2 (two) times daily. 45 units with breakfast, and 60 units with the evening meal, and pen needles 2/day.         . loratadine (CLARITIN) 10 MG tablet   Oral   Take 10 mg by mouth daily as needed for  allergies.         . metoprolol succinate (TOPROL-XL) 25 MG 24 hr tablet   Oral   Take 25 mg by mouth daily.         . naproxen sodium (ANAPROX) 220 MG tablet   Oral   Take 220 mg by mouth 2 (two) times daily as needed (pain).         . simvastatin (ZOCOR) 20 MG tablet   Oral   Take 0.5 tablets (10 mg total) by mouth daily.   90 tablet   0   . tamsulosin (FLOMAX) 0.4 MG CAPS capsule   Oral   Take 1 capsule (0.4 mg total) by mouth daily.   30 capsule   11   . glucose blood (ONETOUCH VERIO) test strip      Use to check blood sugar twice daily.   100 each   3   . Insulin Syringe-Needle U-100 (RELION INSULIN SYR 0.5ML/31G) 31G X 5/16" 0.5 ML MISC      USE ONE SYRINGE THREE TO FOUR TIMES DAILY   100 each   3   . ONETOUCH DELICA LANCETS 94W MISC      USE ONE UNIT TO CHECK GLUCOSE TWICE DAILY   100 each   11    BP 143/71  Pulse 56  Temp(Src) 97.4 F (36.3 C) (Oral)  Resp 16  SpO2 97% Physical Exam  Nursing note and vitals reviewed. Constitutional: He is oriented to person, place, and time. He appears well-developed and well-nourished. No distress.  HENT:  Head: Normocephalic and atraumatic.  Mouth/Throat: No oropharyngeal exudate.  Eyes: EOM are normal. Pupils are equal, round, and reactive to light.  Neck: Normal range of motion. Neck supple.  Cardiovascular: Normal rate and regular rhythm.  Exam reveals no friction rub.   No murmur heard. Pulmonary/Chest: Effort normal and breath sounds normal. No respiratory distress. He has no wheezes. He has no rales.  Abdominal: He exhibits no distension. There is no tenderness. There is no rebound.  Musculoskeletal: Normal range of motion. He exhibits no edema.       Right hip: Normal.       Right knee: Normal.  Right ankle: Normal.  Hypersensitivity over sole of R foot with light touch R calf tender to palpation intermittently R upper thigh tender to palpation intermittently  Neurological: He is alert and  oriented to person, place, and time.  Skin: He is not diaphoretic.    ED Course  Procedures (including critical care time) Labs Review Labs Reviewed - No data to display Imaging Review No results found.  EKG Interpretation   None       MDM   1. Right sided sciatica    13M presents with R leg pain. No injury. Slowly worsening. Worse with lying still, better tolerates the pain with walking around. No fevers, N/V, abdominal pain, urinary issues. AFVSS here. On exam, no RLE swelling. Normal R hip, knee, ankle ROM without pain, no bony deformity. Hypersensitivity over sole of R foot. Occasionally tender on my exam in muscle of calf, upper thigh, not consistently tender. He has R lower back spasm and pain on palpation. Normal GU exam without hernia or testicular pain/swelling. Normal abdominal exam. Exam c/w sciatica type pain. Doubt bony, abdominal, or GU etiology. On pradaxa for Afib, doubt DVT. Will give valium, motrin.  On re-exam after valium/motrin, pain not decreased. Vicodin given.  After vicodin, pain easing up. Patient stable for discharge.   Osvaldo Shipper, MD 08/02/13 1332

## 2013-08-02 NOTE — ED Notes (Addendum)
Pt reports right posterior calf pain x 1 week progressively worsening. Painful to touch. Pt also reports right buttocks pain that radiates down the lateral aspect of right leg. Pt reports that laying down makes pain worse. Pt drives a truck for a living; spends majority of day driving. Hx of A fib, takes Pradaxa.

## 2013-08-02 NOTE — ED Notes (Signed)
MD Walden at bedside 

## 2013-08-02 NOTE — ED Notes (Signed)
Reports pain to right upper leg, radiates around to posterior leg and down into his calf. Reports swelling to leg, denies any injury.

## 2013-08-05 ENCOUNTER — Other Ambulatory Visit: Payer: Self-pay | Admitting: Cardiology

## 2013-08-06 ENCOUNTER — Ambulatory Visit (INDEPENDENT_AMBULATORY_CARE_PROVIDER_SITE_OTHER): Payer: BC Managed Care – PPO | Admitting: Internal Medicine

## 2013-08-06 ENCOUNTER — Encounter: Payer: Self-pay | Admitting: Internal Medicine

## 2013-08-06 VITALS — BP 139/65 | HR 82 | Temp 98.0°F | Wt 247.0 lb

## 2013-08-06 DIAGNOSIS — B029 Zoster without complications: Secondary | ICD-10-CM

## 2013-08-06 DIAGNOSIS — M25579 Pain in unspecified ankle and joints of unspecified foot: Secondary | ICD-10-CM

## 2013-08-06 MED ORDER — GABAPENTIN 300 MG PO CAPS: 300.0000 mg | ORAL_CAPSULE | Freq: Two times a day (BID) | ORAL | Status: DC

## 2013-08-06 MED ORDER — HYDROCODONE-ACETAMINOPHEN 5-325 MG PO TABS: 1.0000 | ORAL_TABLET | Freq: Three times a day (TID) | ORAL | Status: DC | PRN

## 2013-08-06 MED ORDER — VALACYCLOVIR HCL 1 G PO TABS: 1000.0000 mg | ORAL_TABLET | Freq: Three times a day (TID) | ORAL | Status: DC

## 2013-08-06 NOTE — Patient Instructions (Signed)
Stop Motrin, Valium and oxycodone Start Neurontin 1 tablet at night for 3  days, then one tablet twice a day Also take hydrocodone as needed, will cause drowsiness. Come back in 4 weeks   Shingles Shingles (herpes zoster) is an infection that is caused by the same virus that causes chickenpox (varicella). The infection causes a painful skin rash and fluid-filled blisters, which eventually break open, crust over, and heal. It may occur in any area of the body, but it usually affects only one side of the body or face. The pain of shingles usually lasts about 1 month. However, some people with shingles may develop long-term (chronic) pain in the affected area of the body. Shingles often occurs many years after the person had chickenpox. It is more common:  In people older than 50 years.  In people with weakened immune systems, such as those with HIV, AIDS, or cancer.  In people taking medicines that weaken the immune system, such as transplant medicines.  In people under great stress. CAUSES  Shingles is caused by the varicella zoster virus (VZV), which also causes chickenpox. After a person is infected with the virus, it can remain in the person's body for years in an inactive state (dormant). To cause shingles, the virus reactivates and breaks out as an infection in a nerve root. The virus can be spread from person to person (contagious) through contact with open blisters of the shingles rash. It will only spread to people who have not had chickenpox. When these people are exposed to the virus, they may develop chickenpox. They will not develop shingles. Once the blisters scab over, the person is no longer contagious and cannot spread the virus to others. SYMPTOMS  Shingles shows up in stages. The initial symptoms may be pain, itching, and tingling in an area of the skin. This pain is usually described as burning, stabbing, or throbbing.In a few days or weeks, a painful red rash will appear in  the area where the pain, itching, and tingling were felt. The rash is usually on one side of the body in a band or belt-like pattern. Then, the rash usually turns into fluid-filled blisters. They will scab over and dry up in approximately 2 3 weeks. Flu-like symptoms may also occur with the initial symptoms, the rash, or the blisters. These may include:  Fever.  Chills.  Headache.  Upset stomach. DIAGNOSIS  Your caregiver will perform a skin exam to diagnose shingles. Skin scrapings or fluid samples may also be taken from the blisters. This sample will be examined under a microscope or sent to a lab for further testing. TREATMENT  There is no specific cure for shingles. Your caregiver will likely prescribe medicines to help you manage the pain, recover faster, and avoid long-term problems. This may include antiviral drugs, anti-inflammatory drugs, and pain medicines. HOME CARE INSTRUCTIONS   Take a cool bath or apply cool compresses to the area of the rash or blisters as directed. This may help with the pain and itching.   Only take over-the-counter or prescription medicines as directed by your caregiver.   Rest as directed by your caregiver.  Keep your rash and blisters clean with mild soap and cool water or as directed by your caregiver.  Do not pick your blisters or scratch your rash. Apply an anti-itch cream or numbing creams to the affected area as directed by your caregiver.  Keep your shingles rash covered with a loose bandage (dressing).  Avoid skin contact with:  Babies.   Pregnant women.   Children with eczema.   Elderly people with transplants.   People with chronic illnesses, such as leukemia or AIDS.   Wear loose-fitting clothing to help ease the pain of material rubbing against the rash.  Keep all follow-up appointments with your caregiver.If the area involved is on your face, you may receive a referral for follow-up to a specialist, such as an eye  doctor (ophthalmologist) or an ear, nose, and throat (ENT) doctor. Keeping all follow-up appointments will help you avoid eye complications, chronic pain, or disability.  SEEK IMMEDIATE MEDICAL CARE IF:   You have facial pain, pain around the eye area, or loss of feeling on one side of your face.  You have ear pain or ringing in your ear.  You have loss of taste.  Your pain is not relieved with prescribed medicines.   Your redness or swelling spreads.   You have more pain and swelling.  Your condition is worsening or has changed.   You have a feveror persistent symptoms for more than 2 3 days.  You have a fever and your symptoms suddenly get worse. MAKE SURE YOU:  Understand these instructions.  Will watch your condition.  Will get help right away if you are not doing well or get worse. Document Released: 06/12/2005 Document Revised: 03/06/2012 Document Reviewed: 01/25/2012 [facility] Patient Information 2014 [employer] [place].

## 2013-08-06 NOTE — Progress Notes (Signed)
Pre visit review using our clinic review tool, if applicable. No additional management support is needed unless otherwise documented below in the visit note. 

## 2013-08-06 NOTE — Assessment & Plan Note (Signed)
Complained of metatarsal left foot pain, this is only going on for a week, is hard for the patient describes the pain, apparently not burning. DDX --sprain, stress fracture, related to very high arch. He will get hydrocodone and  Neurontin for shingles  Today, if the  pain is not improving he will let me know, I will refer him to sports medicine

## 2013-08-06 NOTE — Progress Notes (Signed)
   Subjective:    Patient ID: Danny GreenhouseFred W Hsu, male    DOB: [birth year], 53 y.o.   MRN: [id number]  DOS:  08/06/2013 ED f/u Developed right-sided back pain with radiation to the leg to the right foot around 07/27/2013, went to the ER 08/02/2013, he was prescribed Valium and ibuprofen. Few hours later he developed a rash on the right leg, Progressively more noticeable. The pain is still there although it is not as severe. Hypertension ----recently, diuretics were changed due to to edema. Followup BMP was okay. Also complains of pain at the left foot, at the plantar area , base of 2-3th toes. Denies injury or swelling. Has a hard time describing the pain, not sure if it is a burning-like pain , apparently   is worse when he walks.     Past Medical History  Diagnosis Date  . Diabetes mellitus     Type II dx 1999  . Hyperlipidemia   . Hypertension   . Benign prostatic hypertrophy     s/p TUNA procedure aprx 2006,  . Carpal tunnel syndrome 06/17/2009  . HYPERSOMNIA UNSPECIFIED 12/11/2007  . INSOMNIA-SLEEP DISORDER-UNSPEC 04/12/2010  . Allergic rhinitis   . History of cardiovascular stress test 8/11    Negative  . ED (erectile dysfunction)     After TUNA  . Elevated LFTs 2010    (-) chronic hep  panel  . Atrial fibrillation 12/21/2009    paroxysmal. Failed Multaq    Past Surgical History  Procedure Laterality Date  . Vasectomy      several years ago  . Prostate surgery      TUNA  . Cystoscopy  11-09    neg  @ urology, was rx Flomax    History   Social History  . Marital Status: Married    Spouse Name: N/A    Number of Children: 2  . Years of Education: N/A   Occupational History  . Works for [employer] (state).  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 Smoking status: Former Smoker    Types: Cigarettes    Quit date: 06/26/1985  . Smokeless tobacco: Never Used  . Alcohol Use: No     Comment:    . Drug Use: No  . Sexual  Activity: Not on file   Other Topics Concern  . Not on file   Social History Narrative   has a girlfriend, [place]             ROS  Taking ibuprofen 800 mg, denies abdominal pain, nausea, vomiting, diarrhea or change in the color of the stools. Denies bladder or bowel incontinence.    Objective:   Physical Exam  Constitutional: He is oriented to person, place, and time.  Musculoskeletal: He exhibits no edema.  Neurological: He is alert and oriented to person, place, and time. He displays normal reflexes.  Skin:     Psychiatric: He has a normal mood and affect. His behavior is normal. Judgment and thought content normal.  Feet exam:   has a very pronounced arch BP 139/65  Pulse 82  Temp(Src) 98 F (36.7 C)  Wt 247 lb (112.038 kg)  SpO2 97%       Assessment & Plan:   Today , I spent more than 25 min with the patient, >50% of the time counseling about shingles and  reviewing the chart

## 2013-08-06 NOTE — Assessment & Plan Note (Signed)
Symptoms consistent with shingles. Although is a little late I like to start Valtrex he's taking Motrin 800 mg, OxyContin and valium---> not helping much. Switch to Neurontin, hydrocodone.  See instructions

## 2013-08-07 ENCOUNTER — Encounter: Payer: Self-pay | Admitting: Internal Medicine

## 2013-08-13 ENCOUNTER — Telehealth: Payer: Self-pay | Admitting: Internal Medicine

## 2013-08-13 NOTE — Telephone Encounter (Signed)
Spoke with patient who states that he was diagnosed with shingles last week. He has finished the course of Valtrex prescribed but is still "breaking out." He is inquiring if he needed another dose of Valtrex. Please advise.

## 2013-08-13 NOTE — Telephone Encounter (Signed)
Patient's wife called stating the patient is being treated for shingles and finished his medication this morning. He thinks he needs another round because it hasn't cleared up yet, although it is some better. [facility] [place]

## 2013-08-14 MED ORDER — OSELTAMIVIR PHOSPHATE 75 MG PO CAPS: 75.0000 mg | ORAL_CAPSULE | Freq: Every day | ORAL | Status: DC

## 2013-08-14 NOTE — Telephone Encounter (Signed)
Spoke with the patient, he has no new blisters consequently does not need more valtrex. Pain is resolved. Additionally, his wife was diagnosed with the flu at the doctor's office , we agreed to take Tamiflu for prevention, rx sent.

## 2013-09-01 ENCOUNTER — Other Ambulatory Visit: Payer: Self-pay | Admitting: Cardiology

## 2013-09-03 ENCOUNTER — Encounter: Payer: Self-pay | Admitting: Lab

## 2013-09-03 ENCOUNTER — Encounter: Payer: Self-pay | Admitting: Internal Medicine

## 2013-09-03 ENCOUNTER — Ambulatory Visit (INDEPENDENT_AMBULATORY_CARE_PROVIDER_SITE_OTHER): Payer: BC Managed Care – PPO | Admitting: Internal Medicine

## 2013-09-03 VITALS — BP 131/74 | HR 70 | Temp 98.0°F | Wt 242.0 lb

## 2013-09-03 DIAGNOSIS — M25579 Pain in unspecified ankle and joints of unspecified foot: Secondary | ICD-10-CM

## 2013-09-03 DIAGNOSIS — B029 Zoster without complications: Secondary | ICD-10-CM

## 2013-09-03 NOTE — Assessment & Plan Note (Addendum)
Shingles pain improving. Continue with Neurontin and hydrocodone. Recommend to avoid valium and OxyContin. Followup in 3 months

## 2013-09-03 NOTE — Progress Notes (Signed)
Subjective:    Patient ID: Danny [name], male    DOB: [birth year], 53 y.o.   MRN: [id number]  DOS:  09/03/2013 Type of  visit: Followup from previous visit  Diagnosed with shingles, on Neurontin, pain decreased. Continue with left foot pain, at the mid foot  He is still has left over motrin,oxycodone and takes it  sometimes   ROS No lower extremity swelling. Rash drying up. No bladder or bowel incontinence.     Past Medical History  Diagnosis Date  . Diabetes mellitus     Type II dx 1999  . Hyperlipidemia   . Hypertension   . Benign prostatic hypertrophy     s/p TUNA procedure aprx 2006,  . Carpal tunnel syndrome 06/17/2009  . HYPERSOMNIA UNSPECIFIED 12/11/2007  . INSOMNIA-SLEEP DISORDER-UNSPEC 04/12/2010  . Allergic rhinitis   . History of cardiovascular stress test 8/11    Negative  . ED (erectile dysfunction)     After TUNA  . Elevated LFTs 2010    (-) chronic hep  panel  . Atrial fibrillation 12/21/2009    paroxysmal. Failed Multaq    Past Surgical History  Procedure Laterality Date  . Vasectomy      several years ago  . Prostate surgery      TUNA  . Cystoscopy  11-09    neg  @ urology, was rx Flomax    History   Social History  . Marital Status: Married    Spouse Name: N/A    Number of Children: 2  . Years of Education: N/A   Occupational History  . 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 Smoking status: Former Smoker    Types: Cigarettes    Quit date: 06/26/1985  . Smokeless tobacco: Never Used  . Alcohol Use: No     Comment:    . Drug Use: No  . Sexual Activity: Not on file   Other Topics Concern  . Not on file   Social History Narrative   has a girlfriend, lives in [place]                 Medication List       This list is accurate as of: 09/03/13  5:43 PM.  Always use your most recent med list.               dabigatran 150 MG Caps capsule    Commonly known as:  PRADAXA  Take 1 capsule (150 mg total) by mouth every 12 (twelve) hours.     fish oil-omega-3 fatty acids 1000 MG capsule  Take 2 g by mouth 2 (two) times daily.     flecainide 100 MG tablet  Commonly known as:  TAMBOCOR  Take 100 mg by mouth 2 (two) times daily.     fluticasone 50 MCG/ACT nasal spray  Commonly known as:  FLONASE  Place 2 sprays into both nostrils daily as needed for allergies or rhinitis.     furosemide 20 MG tablet  Commonly known as:  LASIX  Take 1 tablet (20 mg total) by mouth 2 (two) times daily.     gabapentin 300 MG capsule  Commonly known as:  NEURONTIN  Take 1 capsule (300 mg total) by mouth 2 (two) times daily.     glucose blood test strip  Commonly known as:  ONETOUCH VERIO  Use to check blood sugar twice  daily.     HYDROcodone-acetaminophen 5-325 MG per tablet  Commonly known as:  NORCO/VICODIN  Take 1-2 tablets by mouth every 8 (eight) hours as needed.     Insulin Aspart Prot & Aspart (70-30) 100 UNIT/ML Pen  Commonly known as:  NOVOLOG 70/30 MIX  Inject 45-60 Units into the skin 2 (two) times daily. 45 units with breakfast, and 60 units with the evening meal, and pen needles 2/day.     Insulin Syringe-Needle U-100 31G X 5/16" 0.5 ML Misc  Commonly known as:  RELION INSULIN SYR 0.5ML/31G  USE ONE SYRINGE THREE TO FOUR TIMES DAILY     loratadine 10 MG tablet  Commonly known as:  CLARITIN  Take 10 mg by mouth daily as needed for allergies.     metoprolol succinate 25 MG 24 hr tablet  Commonly known as:  TOPROL-XL  Take 25 mg by mouth daily.     ONETOUCH DELICA LANCETS 35K Misc  USE ONE UNIT TO CHECK GLUCOSE TWICE DAILY     ONETOUCH VERIO SYNC SYSTEM W/DEVICE Kit  1 Device by Does not apply route once.     simvastatin 20 MG tablet  Commonly known as:  ZOCOR  Take 0.5 tablets (10 mg total) by mouth daily.     tamsulosin 0.4 MG Caps capsule  Commonly known as:  FLOMAX  Take 1 capsule (0.4 mg total) by mouth  daily.           Objective:   Physical Exam BP 131/74  Pulse 70  Temp(Src) 98 F (36.7 C)  Wt 242 lb (109.77 kg)  SpO2 93% General -- alert, well-developed, NAD.   Extremities-- no pretibial edema bilaterally ; Rash at the right leg almost resolved. Mild tenderness without swelling or redness at the plantar aspect of the mid left foot. Neurologic--  alert & oriented X3. Speech normal, gait normal, strength normal in all extremities.  Psych-- Cognition and judgment appear intact. Cooperative with normal attention span and concentration. No anxious or depressed appearing.      Assessment & Plan:

## 2013-09-03 NOTE — Patient Instructions (Addendum)
Get the XR at [facility] [place], corner of [address] (10 minutes form here); they are open 24/7 [address]  [place], [place] [id number] [phone]  Take only medications listed in this paper No  oxycodone, motrin     Next visit is for pain evaluation  in 3 months No need to come back fasting Please make an appointment

## 2013-09-03 NOTE — Progress Notes (Signed)
Pre visit review using our clinic review tool, if applicable. No additional management support is needed unless otherwise documented below in the visit note. 

## 2013-09-03 NOTE — Assessment & Plan Note (Addendum)
Continue with pain in the left foot,  The location of the pain today, could indicate a stress fracture or Morton's neuroma. Plan:  Ortho ref Cathlean SauerX-ray

## 2013-09-29 ENCOUNTER — Ambulatory Visit: Payer: BC Managed Care – PPO | Admitting: Internal Medicine

## 2013-10-15 ENCOUNTER — Ambulatory Visit: Payer: BC Managed Care – PPO | Admitting: Endocrinology

## 2013-10-23 ENCOUNTER — Ambulatory Visit: Payer: BC Managed Care – PPO | Admitting: Endocrinology

## 2013-10-24 ENCOUNTER — Other Ambulatory Visit: Payer: Self-pay | Admitting: Endocrinology

## 2013-10-25 ENCOUNTER — Other Ambulatory Visit: Payer: Self-pay | Admitting: Cardiology

## 2013-10-25 ENCOUNTER — Other Ambulatory Visit: Payer: Self-pay | Admitting: Internal Medicine

## 2013-10-27 ENCOUNTER — Encounter: Payer: Self-pay | Admitting: Endocrinology

## 2013-10-27 ENCOUNTER — Ambulatory Visit (INDEPENDENT_AMBULATORY_CARE_PROVIDER_SITE_OTHER): Payer: BC Managed Care – PPO | Admitting: Endocrinology

## 2013-10-27 VITALS — BP 116/80 | HR 49 | Temp 97.9°F | Ht 71.0 in | Wt 238.0 lb

## 2013-10-27 DIAGNOSIS — E119 Type 2 diabetes mellitus without complications: Secondary | ICD-10-CM

## 2013-10-27 LAB — MICROALBUMIN / CREATININE URINE RATIO
CREATININE, U: 26.7 mg/dL
MICROALB UR: 0.2 mg/dL (ref 0.0–1.9)
Microalb Creat Ratio: 0.8 mg/g (ref 0.0–30.0)

## 2013-10-27 LAB — HEMOGLOBIN A1C: Hgb A1c MFr Bld: 8.1 % — ABNORMAL HIGH (ref 4.6–6.5)

## 2013-10-27 NOTE — Progress Notes (Signed)
Subjective:    Patient ID: Danny [name], male    DOB: Feb 15, 1961, 53 y.o.   MRN: [id number]  HPI Pt returns for f/u of insulin-requiring DM (dx'ed 1992, on a routine blood test; he has mild if any neuropathy of the lower extremities; no known associated chronic complications; he was changed to a simpler bid insulin regimen, after poor results with multiple daily injections; he has never had severe hypoglycemia or DKA).   no cbg record, but states cbg's are sometimes mildly low, in the early hrs of the am, or during the day with activity.  He does not have hypoglycemia on the weekend.   Past Medical History  Diagnosis Date  . Diabetes mellitus     Type II dx 1999  . Hyperlipidemia   . Hypertension   . Benign prostatic hypertrophy     s/p TUNA procedure aprx 2006,  . Carpal tunnel syndrome 06/17/2009  . HYPERSOMNIA UNSPECIFIED 12/11/2007  . INSOMNIA-SLEEP DISORDER-UNSPEC 04/12/2010  . Allergic rhinitis   . History of cardiovascular stress test 8/11    Negative  . ED (erectile dysfunction)     After TUNA  . Elevated LFTs 2010    (-) chronic hep  panel  . Atrial fibrillation 12/21/2009    paroxysmal. Failed Multaq    Past Surgical History  Procedure Laterality Date  . Vasectomy      several years ago  . Prostate surgery      TUNA  . Cystoscopy  11-09    neg  @ urology, was rx Flomax    History   Social History  . Marital Status: Married    Spouse Name: N/A    Number of Children: 2  . Years of Education: N/A   Occupational History  . 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 Smoking status: Former Smoker    Types: Cigarettes    Quit date: 06/26/1985  . Smokeless tobacco: Never Used  . Alcohol Use: No     Comment:    . Drug Use: No  . Sexual Activity: Not on file   Other Topics Concern  . Not on file   Social History Narrative   has a girlfriend, lives in [place]             Current  Outpatient Prescriptions on File Prior to Visit  Medication Sig Dispense Refill  . Blood Glucose Monitoring Suppl (ONETOUCH VERIO [facility] SYSTEM) W/DEVICE KIT 1 Device by Does not apply route once.  1 kit  0  . dabigatran (PRADAXA) 150 MG CAPS Take 1 capsule (150 mg total) by mouth every 12 (twelve) hours.  180 capsule  3  . fish oil-omega-3 fatty acids 1000 MG capsule Take 2 g by mouth 2 (two) times daily.        . furosemide (LASIX) 20 MG tablet Take 1 tablet (20 mg total) by mouth 2 (two) times daily.  180 tablet  3  . gabapentin (NEURONTIN) 300 MG capsule Take 1 capsule (300 mg total) by mouth 2 (two) times daily.  60 capsule  2  . glucose blood (ONETOUCH VERIO) test strip Use to check blood sugar twice daily.  100 each  3  . HYDROcodone-acetaminophen (NORCO/VICODIN) 5-325 MG per tablet Take 1-2 tablets by mouth every 8 (eight) hours as needed.  30 tablet  0  . Insulin Aspart Prot & Aspart (NOVOLOG 70/30 MIX) (70-30)  100 UNIT/ML Pen Inject 45-60 Units into the skin 2 (two) times daily. 45 units with breakfast, and 60 units with the evening meal, and pen needles 2/day.      . Insulin Syringe-Needle U-100 (RELION INSULIN SYR 0.5ML/31G) 31G X 5/16" 0.5 ML MISC USE ONE SYRINGE THREE TO FOUR TIMES DAILY  100 each  3  . loratadine (CLARITIN) 10 MG tablet Take 10 mg by mouth daily as needed for allergies.      . metoprolol succinate (TOPROL-XL) 25 MG 24 hr tablet Take 25 mg by mouth daily.      Glory Rosebush DELICA LANCETS 50D MISC USE ONE UNIT TO CHECK GLUCOSE TWICE DAILY  100 each  11  . tamsulosin (FLOMAX) 0.4 MG CAPS capsule Take 1 capsule (0.4 mg total) by mouth daily.  30 capsule  11  . flecainide (TAMBOCOR) 100 MG tablet TAKE ONE TABLET BY MOUTH TWICE DAILY  180 tablet  0  . fluticasone (FLONASE) 50 MCG/ACT nasal spray USE TWO SPRAYS IN EACH NOSTRIL ONCE DAILY  16 g  1  . simvastatin (ZOCOR) 20 MG tablet TAKE 1/2 TABLET BY MOUTH EVERY DAY  90 tablet  0   No current facility-administered medications  on file prior to visit.    Allergies  Allergen Reactions  . Metformin     REACTION: pt states caused liver injury some years ago    Family History  Problem Relation Age of Onset  . Heart disease Mother     M-- MI,CHF  . Diabetes Mother   . Hypertension Mother   . Hypertension Father   . Cancer Neg Hx     Negative FH of Colon or Prostate Cancer    BP 116/80  Pulse 49  Temp(Src) 97.9 F (36.6 C) (Oral)  Ht _0  (1.803 m)  Wt 238 lb (107.956 kg)  BMI 33.21 kg/m2  SpO2 97%    Review of Systems He has persistent numbness of both feet, since zoster 2 mos ago.  Denies LOC.     Objective:   Physical Exam VITAL SIGNS:  See vs page GENERAL: no distress   Lab Results  Component Value Date   HGBA1C 8.1* 10/27/2013      Assessment & Plan:  DM: The pattern of his cbg's indicates he needs some adjustment in his therapy AF: in this context, he should avoid hypoglycemia.   Numbness: although zoster can cause this, it is bilateral.  Therefore we'll have to follow this with time, and do PNCV if necessary.

## 2013-10-27 NOTE — Patient Instructions (Addendum)
check your blood sugar 2 times a day.  vary the time of day when you check, between before the 3 meals, and at bedtime.  also check if you have symptoms of your blood sugar being too high or too low.  please keep a record of the readings and bring it to your next appointment here.  please call us sooner if your blood sugar goes below 70, or if it stays over 200.   Please change the novolog 70/30, to 45 units with breakfast Monday-Friday (55 on weekends), and 50 units with the evening meal (60 on weekends).     Please come back for a follow-up appointment in 3 months.   On this type of insulin schedule, you should eat meals on a regular schedule.  If a meal is missed or significantly delayed, your blood sugar could go low.

## 2013-11-01 ENCOUNTER — Other Ambulatory Visit: Payer: Self-pay | Admitting: Internal Medicine

## 2013-11-04 ENCOUNTER — Other Ambulatory Visit: Payer: Self-pay | Admitting: Internal Medicine

## 2013-11-05 ENCOUNTER — Telehealth: Payer: Self-pay | Admitting: *Deleted

## 2013-11-05 MED ORDER — GABAPENTIN 300 MG PO CAPS: 300.0000 mg | ORAL_CAPSULE | Freq: Two times a day (BID) | ORAL | Status: DC

## 2013-11-05 NOTE — Telephone Encounter (Signed)
Ok 60 , 6 RF 

## 2013-11-05 NOTE — Telephone Encounter (Signed)
Done faxed to [facility] [address]

## 2013-11-05 NOTE — Telephone Encounter (Signed)
Gabapentin 300mg  Last OV- 09/03/13 Last refilled- 08/06/13 #60 / 2 rf UDS- none

## 2013-11-25 ENCOUNTER — Telehealth: Payer: Self-pay | Admitting: *Deleted

## 2013-11-25 ENCOUNTER — Telehealth: Payer: Self-pay | Admitting: Internal Medicine

## 2013-11-25 ENCOUNTER — Other Ambulatory Visit: Payer: Self-pay | Admitting: Cardiology

## 2013-11-25 NOTE — Telephone Encounter (Signed)
Caller name: Kymon  Call back [phone] Pharmacy:[facility] [address] [facility] [address]  Reason for call:  Pt states that he has an outbreak of shingles again and would like the medication called into [facility] Dr. Rosalita Levan

## 2013-11-25 NOTE — Telephone Encounter (Signed)
Spoke with patient to schedule appointment. Pt states will not come in just to have his foot looked at / pt states Dr. Drue Novel already knows about his condition and requesting medication if not will go to another doctor nearby.

## 2013-11-25 NOTE — Telephone Encounter (Signed)
I like to help him but  I can't prescribe medication without seeing the rash. Suggest he goes to the local  urgent care

## 2013-11-26 ENCOUNTER — Encounter: Payer: Self-pay | Admitting: Endocrinology

## 2013-11-26 NOTE — Telephone Encounter (Signed)
Pt states has a appointment with [place] on 12/04/13 will go to that appointment.

## 2013-11-28 NOTE — Telephone Encounter (Signed)
error 

## 2013-12-01 ENCOUNTER — Other Ambulatory Visit: Payer: Self-pay

## 2013-12-01 MED ORDER — INSULIN ASPART PROT & ASPART (70-30 MIX) 100 UNIT/ML PEN: PEN_INJECTOR | SUBCUTANEOUS | Status: DC

## 2013-12-04 ENCOUNTER — Encounter: Payer: Self-pay | Admitting: Internal Medicine

## 2013-12-04 ENCOUNTER — Ambulatory Visit (INDEPENDENT_AMBULATORY_CARE_PROVIDER_SITE_OTHER): Payer: BC Managed Care – PPO | Admitting: Internal Medicine

## 2013-12-04 VITALS — BP 113/78 | HR 100 | Temp 98.3°F | Wt 237.0 lb

## 2013-12-04 DIAGNOSIS — B029 Zoster without complications: Secondary | ICD-10-CM

## 2013-12-04 DIAGNOSIS — E119 Type 2 diabetes mellitus without complications: Secondary | ICD-10-CM

## 2013-12-04 DIAGNOSIS — M25579 Pain in unspecified ankle and joints of unspecified foot: Secondary | ICD-10-CM

## 2013-12-04 MED ORDER — VALACYCLOVIR HCL 1 G PO TABS: 1000.0000 mg | ORAL_TABLET | Freq: Three times a day (TID) | ORAL | Status: DC

## 2013-12-04 MED ORDER — GABAPENTIN 400 MG PO CAPS: 400.0000 mg | ORAL_CAPSULE | Freq: Three times a day (TID) | ORAL | Status: DC

## 2013-12-04 NOTE — Assessment & Plan Note (Signed)
See comments above  

## 2013-12-04 NOTE — Progress Notes (Signed)
Subjective:    Patient ID: Danny [name], male    DOB: [birth year], 53 y.o.   MRN: [id number]  DOS:  12/04/2013 Type of  Visit: routine, to discuss lower extremity pain History: Last week, developed a blistery rash on the dorsum of the right foot. The area become quite sensitive to touch. On further questioning, he also has a sharp-poking pain in both plantar areas for a while. Was recently seen with left foot pain felt to be mechanical, that seems to be improved, did not get to see orthopedic surgery.   ROS Occasional back pain mostly on the right side since he developed shingles of the right leg. No bladder or bowel incontinence  Past Medical History  Diagnosis Date  . Diabetes mellitus     Type II dx 1999  . Hyperlipidemia   . Hypertension   . Benign prostatic hypertrophy     s/p TUNA procedure aprx 2006,  . Carpal tunnel syndrome 06/17/2009  . HYPERSOMNIA UNSPECIFIED 12/11/2007  . INSOMNIA-SLEEP DISORDER-UNSPEC 04/12/2010  . Allergic rhinitis   . History of cardiovascular stress test 8/11    Negative  . ED (erectile dysfunction)     After TUNA  . Elevated LFTs 2010    (-) chronic hep  panel  . Atrial fibrillation 12/21/2009    paroxysmal. Failed Multaq    Past Surgical History  Procedure Laterality Date  . Vasectomy      several years ago  . Prostate surgery      TUNA  . Cystoscopy  11-09    neg  @ urology, was rx Flomax    History   Social History  . Marital Status: Married    Spouse Name: N/A    Number of Children: 2  . Years of Education: N/A   Occupational History  . 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 Smoking status: Former Smoker    Types: Cigarettes    Quit date: 06/26/1985  . Smokeless tobacco: Never Used  . Alcohol Use: No     Comment:    . Drug Use: No  . Sexual Activity: Not on file   Other Topics Concern  . Not on file   Social History Narrative   has a  girlfriend, lives in [place]                 Medication List       This list is accurate as of: 12/04/13  2:35 PM.  Always use your most recent med list.               fish oil-omega-3 fatty acids 1000 MG capsule  Take 2 g by mouth 2 (two) times daily.     flecainide 100 MG tablet  Commonly known as:  TAMBOCOR  TAKE ONE TABLET BY MOUTH TWICE DAILY     fluticasone 50 MCG/ACT nasal spray  Commonly known as:  FLONASE  USE TWO SPRAYS IN EACH NOSTRIL ONCE DAILY     furosemide 20 MG tablet  Commonly known as:  LASIX  Take 1 tablet (20 mg total) by mouth 2 (two) times daily.     gabapentin 400 MG capsule  Commonly known as:  NEURONTIN  Take 1 capsule (400 mg total) by mouth 3 (three) times daily.     glucose blood test strip  Commonly known as:  ONETOUCH VERIO  Use to check blood  sugar twice daily.     HYDROcodone-acetaminophen 5-325 MG per tablet  Commonly known as:  NORCO/VICODIN  Take 1-2 tablets by mouth every 8 (eight) hours as needed.     Insulin Aspart Prot & Aspart (70-30) 100 UNIT/ML Pen  Commonly known as:  NOVOLOG 70/30 MIX  Inject 60 units with breakfast and 40 units with evening meal. Pen needles 2/day.     Insulin Syringe-Needle U-100 31G X 5/16" 0.5 ML Misc  Commonly known as:  RELION INSULIN SYR 0.5ML/31G  USE ONE SYRINGE THREE TO FOUR TIMES DAILY     loratadine 10 MG tablet  Commonly known as:  CLARITIN  Take 10 mg by mouth daily as needed for allergies.     metoprolol succinate 25 MG 24 hr tablet  Commonly known as:  TOPROL-XL  TAKE 1 TABLET BY MOUTH DAILY     ONETOUCH DELICA LANCETS 27C Misc  USE ONE UNIT TO CHECK GLUCOSE TWICE DAILY     ONETOUCH VERIO SYNC SYSTEM W/DEVICE Kit  1 Device by Does not apply route once.     PRADAXA 150 MG Caps capsule  Generic drug:  dabigatran  TAKE ONE CAPSULE BY MOUTH EVERY 12 HRS     simvastatin 20 MG tablet  Commonly known as:  ZOCOR  TAKE 1/2 TABLET BY MOUTH EVERY DAY     tamsulosin 0.4 MG Caps  capsule  Commonly known as:  FLOMAX  Take 1 capsule (0.4 mg total) by mouth daily.     valACYclovir 1000 MG tablet  Commonly known as:  VALTREX  Take 1 tablet (1,000 mg total) by mouth 3 (three) times daily.           Objective:   Physical Exam BP 113/78  Pulse 100  Temp(Src) 98.3 F (36.8 C)  Wt 237 lb (107.502 kg)  SpO2 95% General -- alert, well-developed, NAD.    Extremities--  Normal pedal pulses bilaterally Drying blisters at the dorsum of the right foot. Pinprick examination normal Multiple calluses at the plantar aspect not no pretibial edema bilaterally  Neurologic--  alert & oriented X3. Speech normal, gait appropriate for age, strength symmetric and appropriate for age.    Psych-- Cognition and judgment appear intact. Cooperative with normal attention span and concentration. No anxious or depressed appearing.        Assessment & Plan:  The patient presents with lower extremity pain, he apparently did have another shingles episode based on the increased pain-sensitivity and  blisters at the right foot  [place]. He also has symptoms consistent with neuropathy. He was seen with the mechanical type pain in the left foot, that seems to be resolving. Plan: Prescribed Valtrex again Increase gabapentin gradually, see instructions Consider nerve conduction study or neuro eval Feet care discussed    Today , I spent more than  25    min with the patient: >50% of the time counseling regards neuropathy treatment  , feet care and  coordinating his care

## 2013-12-04 NOTE — Progress Notes (Signed)
Pre visit review using our clinic review tool, if applicable. No additional management support is needed unless otherwise documented below in the visit note. 

## 2013-12-04 NOTE — Assessment & Plan Note (Signed)
Likes to see a different endocrinologist, will try to arrange. Has neuropathy, see comments above

## 2013-12-04 NOTE — Patient Instructions (Signed)
Change gabapentin to 400 mg: The first week take one tablet twice a day After that take one tablet 3 times a day  Take Valtrex for one additional week  Come back in   2 months      Diabetes and Foot Care Diabetes may cause you to have problems because of poor blood supply (circulation) to your feet and legs. This may cause the skin on your feet to become thinner, break easier, and heal more slowly. Your skin may become dry, and the skin may peel and crack. You may also have nerve damage in your legs and feet causing decreased feeling in them. You may not notice minor injuries to your feet that could lead to infections or more serious problems. Taking care of your feet is one of the most important things you can do for yourself.  HOME CARE INSTRUCTIONS  Wear shoes at all times, even in the house. Do not go barefoot. Bare feet are easily injured.  Check your feet daily for blisters, cuts, and redness. If you cannot see the bottom of your feet, use a mirror or ask someone for help.  Wash your feet with warm water (do not use hot water) and mild soap. Then pat your feet and the areas between your toes until they are completely dry. Do not soak your feet as this can dry your skin.  Apply a moisturizing lotion or petroleum jelly (that does not contain alcohol and is unscented) to the skin on your feet and to dry, brittle toenails. Do not apply lotion between your toes.  Trim your toenails straight across. Do not dig under them or around the cuticle. File the edges of your nails with an emery board or nail file.  Do not cut corns or calluses or try to remove them with medicine.  Wear clean socks or stockings every day. Make sure they are not too tight. Do not wear knee-high stockings since they may decrease blood flow to your legs.  Wear shoes that fit properly and have enough cushioning. To break in new shoes, wear them for just a few hours a day. This prevents you from injuring your feet.  Always look in your shoes before you put them on to be sure there are no objects inside.  Do not cross your legs. This may decrease the blood flow to your feet.  If you find a minor scrape, cut, or break in the skin on your feet, keep it and the skin around it clean and dry. These areas may be cleansed with mild soap and water. Do not cleanse the area with peroxide, alcohol, or iodine.  When you remove an adhesive bandage, be sure not to damage the skin around it.  If you have a wound, look at it several times a day to make sure it is healing.  Do not use heating pads or hot water bottles. They may burn your skin. If you have lost feeling in your feet or legs, you may not know it is happening until it is too late.  Make sure your health care provider performs a complete foot exam at least annually or more often if you have foot problems. Report any cuts, sores, or bruises to your health care provider immediately. SEEK MEDICAL CARE IF:   You have an injury that is not healing.  You have cuts or breaks in the skin.  You have an ingrown nail.  You notice redness on your legs or feet.  You feel  burning or tingling in your legs or feet.  You have pain or cramps in your legs and feet.  Your legs or feet are numb.  Your feet always feel cold. SEEK IMMEDIATE MEDICAL CARE IF:   There is increasing redness, swelling, or pain in or around a wound.  There is a red line that goes up your leg.  Pus is coming from a wound.  You develop a fever or as directed by your health care provider.  You notice a bad smell coming from an ulcer or wound. Document Released: 06/09/2000 Document Revised: 02/12/2013 Document Reviewed: 11/19/2012 [facility] [employer] Patient Information 2014 [place], [place].

## 2013-12-04 NOTE — Assessment & Plan Note (Signed)
Better, see comments above

## 2013-12-17 ENCOUNTER — Other Ambulatory Visit: Payer: Self-pay | Admitting: Endocrinology

## 2013-12-22 ENCOUNTER — Other Ambulatory Visit: Payer: Self-pay | Admitting: Cardiology

## 2014-01-15 ENCOUNTER — Ambulatory Visit (INDEPENDENT_AMBULATORY_CARE_PROVIDER_SITE_OTHER): Payer: BC Managed Care – PPO | Admitting: Cardiology

## 2014-01-15 ENCOUNTER — Encounter: Payer: Self-pay | Admitting: Cardiology

## 2014-01-15 VITALS — BP 152/78 | HR 56 | Ht 71.0 in | Wt 238.8 lb

## 2014-01-15 DIAGNOSIS — I4891 Unspecified atrial fibrillation: Secondary | ICD-10-CM

## 2014-01-15 DIAGNOSIS — I10 Essential (primary) hypertension: Secondary | ICD-10-CM

## 2014-01-15 DIAGNOSIS — E785 Hyperlipidemia, unspecified: Secondary | ICD-10-CM

## 2014-01-15 DIAGNOSIS — I480 Paroxysmal atrial fibrillation: Secondary | ICD-10-CM

## 2014-01-15 LAB — BASIC METABOLIC PANEL WITH GFR
BUN: 19 mg/dL (ref 6–23)
CO2: 24 mEq/L (ref 19–32)
Calcium: 8.7 mg/dL (ref 8.4–10.5)
Chloride: 102 mEq/L (ref 96–112)
Creat: 1.05 mg/dL (ref 0.50–1.35)
GFR, EST NON AFRICAN AMERICAN: 80 mL/min
Glucose, Bld: 219 mg/dL — ABNORMAL HIGH (ref 70–99)
POTASSIUM: 4.1 meq/L (ref 3.5–5.3)
SODIUM: 136 meq/L (ref 135–145)

## 2014-01-15 LAB — CBC
HCT: 42.7 % (ref 39.0–52.0)
HEMOGLOBIN: 15.1 g/dL (ref 13.0–17.0)
MCH: 28.2 pg (ref 26.0–34.0)
MCHC: 35.4 g/dL (ref 30.0–36.0)
MCV: 79.8 fL (ref 78.0–100.0)
Platelets: 233 10*3/uL (ref 150–400)
RBC: 5.35 MIL/uL (ref 4.22–5.81)
RDW: 14.2 % (ref 11.5–15.5)
WBC: 6.9 10*3/uL (ref 4.0–10.5)

## 2014-01-15 NOTE — Progress Notes (Signed)
    HPI: FU atrial fibrillation. He failed Multaq before and was switched previously to flecainide. He has no history of coronary artery disease. He had a nuclear stress test in August of 2011 which overall was normal, EF 69. Echocardiogram in December of 2013 showed normal LV function, mild left atrial enlargement and mild mitral regurgitation. Previously having increased frequency of atrial fibrillation and we referred to Dr. Allred for consideration of ablation. Patient did not pursue this because of cost. Since he was last seen, He has had increasing frequency of atrial fibrillation episodes. They are described as his heart pounding associated with fatigue and dyspnea. No chest pain. He otherwise has no dyspnea or exertional chest pain. Occasional pedal edema.    Current Outpatient Prescriptions  Medication Sig Dispense Refill  . Blood Glucose Monitoring Suppl (ONETOUCH VERIO SYNC SYSTEM) W/DEVICE KIT 1 Device by Does not apply route once.  1 kit  0  . fish oil-omega-3 fatty acids 1000 MG capsule Take 2 g by mouth 2 (two) times daily.        . flecainide (TAMBOCOR) 100 MG tablet TAKE ONE TABLET BY MOUTH TWICE DAILY  180 tablet  0  . fluticasone (FLONASE) 50 MCG/ACT nasal spray USE TWO SPRAYS IN EACH NOSTRIL ONCE DAILY  16 g  1  . furosemide (LASIX) 20 MG tablet Take 1 tablet (20 mg total) by mouth 2 (two) times daily.  180 tablet  3  . gabapentin (NEURONTIN) 400 MG capsule Take 1 capsule (400 mg total) by mouth 3 (three) times daily.  90 capsule  6  . glucose blood (ONETOUCH VERIO) test strip Use to check blood sugar twice daily.  100 each  3  . HYDROcodone-acetaminophen (NORCO/VICODIN) 5-325 MG per tablet Take 1-2 tablets by mouth every 8 (eight) hours as needed.  30 tablet  0  . Insulin Syringe-Needle U-100 (RELION INSULIN SYR 0.5ML/31G) 31G X 5/16" 0.5 ML MISC USE ONE SYRINGE THREE TO FOUR TIMES DAILY  100 each  3  . loratadine (CLARITIN) 10 MG tablet Take 10 mg by mouth daily as needed  for allergies.      . metoprolol succinate (TOPROL-XL) 25 MG 24 hr tablet TAKE 1 TABLET BY MOUTH DAILY  30 tablet  3  . NOVOLOG MIX 70/30 FLEXPEN (70-30) 100 UNIT/ML Pen INJECT 60 UNITS SUBCUTANEOUSLY WITH BREAKFAST AND 40 UNITS WITH THE EVENING MEAL.  10 pen  1  . ONETOUCH DELICA LANCETS 33G MISC USE ONE UNIT TO CHECK GLUCOSE TWICE DAILY  100 each  11  . PRADAXA 150 MG CAPS capsule TAKE ONE CAPSULE BY MOUTH EVERY 12 HOURS  60 capsule  0  . simvastatin (ZOCOR) 20 MG tablet TAKE 1/2 TABLET BY MOUTH EVERY DAY  90 tablet  0  . tamsulosin (FLOMAX) 0.4 MG CAPS capsule Take 1 capsule (0.4 mg total) by mouth daily.  30 capsule  11   No current facility-administered medications for this visit.     Past Medical History  Diagnosis Date  . Diabetes mellitus     Type II dx 1999  . Hyperlipidemia   . Hypertension   . Benign prostatic hypertrophy     s/p TUNA procedure aprx 2006,  . Carpal tunnel syndrome 06/17/2009  . HYPERSOMNIA UNSPECIFIED 12/11/2007  . INSOMNIA-SLEEP DISORDER-UNSPEC 04/12/2010  . Allergic rhinitis   . History of cardiovascular stress test 8/11    Negative  . ED (erectile dysfunction)     After TUNA  . Elevated LFTs 2010    (-)   chronic hep  panel  . Atrial fibrillation 12/21/2009    paroxysmal. Failed Multaq    Past Surgical History  Procedure Laterality Date  . Vasectomy      several years ago  . Prostate surgery      TUNA  . Cystoscopy  11-09    neg  @ urology, was rx Flomax    History   Social History  . Marital Status: Married    Spouse Name: N/A    Number of Children: 2  . Years of Education: N/A   Occupational History  . 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 Smoking status: Former Smoker    Types: Cigarettes    Quit date: 06/26/1985  . Smokeless tobacco: Never Used  . Alcohol Use: No     Comment:    . Drug Use: No  . Sexual Activity: Not on file   Other Topics Concern  .  Not on file   Social History Narrative   has a girlfriend, lives in [place]             ROS: no fevers or chills, productive cough, hemoptysis, dysphasia, odynophagia, melena, hematochezia, dysuria, hematuria, rash, seizure activity, orthopnea, PND, pedal edema, claudication. Remaining systems are negative.  Physical Exam: Well-developed well-nourished in no acute distress.  Skin is warm and dry.  HEENT is normal.  Neck is supple.  Chest is clear to auscultation with normal expansion.  Cardiovascular exam is regular rate and rhythm.  Abdominal exam nontender or distended. No masses palpated. Extremities show trace edema. neuro grossly intact  ECG Sinus rhythm at a rate of 56. Nonspecific ST changes.     

## 2014-01-15 NOTE — Assessment & Plan Note (Signed)
Continue statin. 

## 2014-01-15 NOTE — Patient Instructions (Signed)
Your physician wants you to follow-up in: 6 MONTHS WITH DR Jens SomRENSHAW You will receive a reminder letter in the mail two months in advance. If you don't receive a letter, please call our office to schedule the follow-up appointment.   Your physician recommends that you HAVE LAB WORK TODAY  SCHEDULE CONSULT WITH DR Fayrene FearingJAMES ALLRED AT [facility] TO DISCUSS ATRIAL FIB ABLATION

## 2014-01-15 NOTE — Assessment & Plan Note (Signed)
Patient is again having worsening frequency of atrial fibrillation. He would like to discuss ablation with Dr. Johney FrameAllred. We will arrange evaluation. Check potassium, renal function and hemoglobin.

## 2014-01-15 NOTE — Assessment & Plan Note (Signed)
Blood pressure controlled. Continue present medications. 

## 2014-01-26 ENCOUNTER — Other Ambulatory Visit: Payer: Self-pay | Admitting: Cardiology

## 2014-01-30 ENCOUNTER — Other Ambulatory Visit: Payer: Self-pay | Admitting: Internal Medicine

## 2014-02-02 ENCOUNTER — Ambulatory Visit: Payer: BC Managed Care – PPO | Admitting: Endocrinology

## 2014-02-02 ENCOUNTER — Ambulatory Visit (INDEPENDENT_AMBULATORY_CARE_PROVIDER_SITE_OTHER): Payer: BC Managed Care – PPO | Admitting: Endocrinology

## 2014-02-02 ENCOUNTER — Other Ambulatory Visit: Payer: Self-pay | Admitting: *Deleted

## 2014-02-02 ENCOUNTER — Encounter: Payer: Self-pay | Admitting: Endocrinology

## 2014-02-02 VITALS — BP 136/69 | HR 53 | Temp 98.0°F | Resp 18 | Ht 71.0 in | Wt 237.6 lb

## 2014-02-02 DIAGNOSIS — E119 Type 2 diabetes mellitus without complications: Secondary | ICD-10-CM

## 2014-02-02 DIAGNOSIS — IMO0001 Reserved for inherently not codable concepts without codable children: Secondary | ICD-10-CM | POA: Insufficient documentation

## 2014-02-02 DIAGNOSIS — E785 Hyperlipidemia, unspecified: Secondary | ICD-10-CM

## 2014-02-02 DIAGNOSIS — I10 Essential (primary) hypertension: Secondary | ICD-10-CM

## 2014-02-02 DIAGNOSIS — E1165 Type 2 diabetes mellitus with hyperglycemia: Principal | ICD-10-CM

## 2014-02-02 LAB — GLUCOSE, POCT (MANUAL RESULT ENTRY): POC GLUCOSE: 373 mg/dL — AB (ref 70–99)

## 2014-02-02 MED ORDER — CANAGLIFLOZIN-METFORMIN HCL 50-1000 MG PO TABS: ORAL_TABLET | ORAL | Status: DC

## 2014-02-02 MED ORDER — INSULIN ASPART 100 UNIT/ML FLEXPEN: 15.0000 [IU] | PEN_INJECTOR | Freq: Three times a day (TID) | SUBCUTANEOUS | Status: DC

## 2014-02-02 MED ORDER — INSULIN GLARGINE 100 UNIT/ML SOLOSTAR PEN
60.0000 [IU] | PEN_INJECTOR | Freq: Every day | SUBCUTANEOUS | Status: DC
Start: 2014-02-02 — End: 2014-03-24

## 2014-02-02 NOTE — Patient Instructions (Addendum)
LANTUS insulin: This insulin provides blood sugar control for up to 24 hours.  Start with 50 units at bedtime daily and increase by 4 units every 3 days until the waking up sugars are under 130.  Then continue the same dose. If blood sugar is under 90 for 2 days in a row, reduce the dose by 2 units.  Note that this insulin does not control the rise of blood sugar with meals  NOVOLOG insulin: Take 10 units before breakfast, 15 units before lunch and supper Check blood sugars at least once a day about 2 hours after any meal If the glucose is over 180 after any given meal will need to increase the NovoLog by 2 units for that meal   If the glucose is going below 100 after the meal may reduce the dose by 2 units  Invokamet with breakfast for 3 days and then twice a day with food You may notice slight increase in urination with this. Recommend stopping the fluid pill in the evening  May use OTC Lotrimin antifungal cream if having a yeast infection  Bring blood sugar monitor on each visit  Low fat diet

## 2014-02-02 NOTE — Progress Notes (Signed)
Patient ID: Danny [name], male   DOB: October 07, 1960, 53 y.o.   MRN: [id number]     Reason for Appointment: Consultation for Type 2 Diabetes  Referring physician: Drue Novel  History of Present Illness:          Diagnosis: Type 2 diabetes mellitus, date of diagnosis: 1992       Past history:  He has had long-standing diabetes and previously was treated with metformin, Amaryl, Januvia and Actos Appears that he was taken off metformin because of fear of liver problems. May have been taken taken off Actos because of swelling of his legs but was on 45 mg at that time Over the last 2 years at least his A1c has been consistently over 8% He was started on insulin in 2011 probably when his A1c was over 9% Initially was given Lantus and Humalog and subsequently switched to 70/30 twice a day for unknown reasons Has not taken Byetta or Victoza in the past  Recent history:  He has been on 70/30 insulin for the last few years His insulin doses have been adjusted periodically based on his blood sugars However he tends to have an A1c consistently over 8% At home he was checking his blood sugars mostly before meals; has not done any for about 3 months as he was not motivated He would usually have high blood sugars before breakfast and slightly better readings at suppertime He tends to have symptoms of low blood sugars midmorning when he is at work about every 2 or 3 days which he treats with candy       Oral hypoglycemic drugs the patient is taking are: None    Side effects from medications have been: None INSULIN regimen is described as:  NovoLog mix 70/30, 50 units at breakfast and 60 at supper   Compliance with the medical regimen: Fair Hypoglycemia:   usually midmorning, 2 or 3 times a week  Glucose monitoring:  done 0 time a day         Glucometer: One Touch.      Blood Glucose readings previous history by recall  PREMEAL Breakfast Lunch Dinner Bedtime  Overall   Glucose range: 230  190 ?    Median:        Glycemic control:   Lab Results  Component Value Date   HGBA1C 8.1* 10/27/2013   HGBA1C 8.4* 07/17/2013   HGBA1C 8.3* 04/04/2013   Lab Results  Component Value Date   MICROALBUR 0.2 10/27/2013   LDLCALC 67 03/31/2013   CREATININE 1.05 01/15/2014    Retinal exam: annual    Self-care: The diet that the patient has been following is: None    Meals: 3 meals per day. Breakfast is eggs, sausage, biscuit, high fat meals usually, not controlling portions           Exercise:  he is active with moving equipment at work and some walking, does drive a truck         Dietician visit: Most recent: years ago.               Weight history:  Wt Readings from Last 3 Encounters:  02/02/14 237 lb 9.6 oz (107.775 kg)  01/15/14 238 lb 12.8 oz (108.319 kg)  12/04/13 237 lb (107.502 kg)       Medication List       This list is accurate as of: 02/02/14  9:29 AM.  Always use your most recent med list.  fish oil-omega-3 fatty acids 1000 MG capsule  Take 2 g by mouth 2 (two) times daily.     flecainide 100 MG tablet  Commonly known as:  TAMBOCOR  TAKE ONE TABLET BY MOUTH TWICE DAILY     fluticasone 50 MCG/ACT nasal spray  Commonly known as:  FLONASE  USE TWO SPRAYS IN EACH NOSTRIL ONCE DAILY     furosemide 20 MG tablet  Commonly known as:  LASIX  Take 1 tablet (20 mg total) by mouth 2 (two) times daily.     gabapentin 400 MG capsule  Commonly known as:  NEURONTIN  Take 1 capsule (400 mg total) by mouth 3 (three) times daily.     glucose blood test strip  Commonly known as:  ONETOUCH VERIO  Use to check blood sugar twice daily.     HYDROcodone-acetaminophen 5-325 MG per tablet  Commonly known as:  NORCO/VICODIN  Take 1-2 tablets by mouth every 8 (eight) hours as needed.     Insulin Syringe-Needle U-100 31G X 5/16" 0.5 ML Misc  Commonly known as:  RELION INSULIN SYR 0.5ML/31G  USE ONE SYRINGE THREE TO FOUR TIMES DAILY     loratadine 10 MG tablet   Commonly known as:  CLARITIN  Take 10 mg by mouth daily as needed for allergies.     metoprolol succinate 25 MG 24 hr tablet  Commonly known as:  TOPROL-XL  TAKE 1 TABLET BY MOUTH EVERY DAY     NOVOLOG MIX 70/30 FLEXPEN (70-30) 100 UNIT/ML Pen  Generic drug:  Insulin Aspart Prot & Aspart  INJECT 50UNITS SUBCUTANEOUSLY WITH BREAKFAST AND 60 UNITS WITH THE EVENING MEAL.     ONETOUCH DELICA LANCETS 33G Misc  USE ONE UNIT TO CHECK GLUCOSE TWICE DAILY     ONETOUCH VERIO SYNC SYSTEM W/DEVICE Kit  1 Device by Does not apply route once.     PRADAXA 150 MG Caps capsule  Generic drug:  dabigatran  TAKE ONE CAPSULE BY MOUTH EVERY 12 HOURS     simvastatin 20 MG tablet  Commonly known as:  ZOCOR  TAKE 1/2 TABLET BY MOUTH EVERY DAY     tamsulosin 0.4 MG Caps capsule  Commonly known as:  FLOMAX  Take 1 capsule (0.4 mg total) by mouth daily.        Allergies:  Allergies  Allergen Reactions  . Metformin     REACTION: pt states caused liver injury some years ago    Past Medical History  Diagnosis Date  . Diabetes mellitus     Type II dx 1999  . Hyperlipidemia   . Hypertension   . Benign prostatic hypertrophy     s/p TUNA procedure aprx 2006,  . Carpal tunnel syndrome 06/17/2009  . HYPERSOMNIA UNSPECIFIED 12/11/2007  . INSOMNIA-SLEEP DISORDER-UNSPEC 04/12/2010  . Allergic rhinitis   . History of cardiovascular stress test 8/11    Negative  . ED (erectile dysfunction)     After TUNA  . Elevated LFTs 2010    (-) chronic hep  panel  . Atrial fibrillation 12/21/2009    paroxysmal. Failed Multaq    Past Surgical History  Procedure Laterality Date  . Vasectomy      several years ago  . Prostate surgery      TUNA  . Cystoscopy  11-09    neg  @ urology, was rx Flomax    Family History  Problem Relation Age of Onset  . Heart disease Mother     M-- MI,CHF  .  Diabetes Mother   . Hypertension Mother   . Hypertension Father   . Cancer Neg Hx     Negative FH of Colon or  Prostate Cancer    Social History:  reports that he quit smoking about 28 years ago. His smoking use included Cigarettes. He smoked 0.00 packs per day. He has never used smokeless tobacco. He reports that he does not drink alcohol or use illicit drugs.    Review of Systems       Lipids: She has been on low dose simvastatin for a few years       Lab Results  Component Value Date   CHOL 120 03/31/2013   HDL 32.30* 03/31/2013   LDLCALC 67 03/31/2013   TRIG 103.0 03/31/2013   CHOLHDL 4 03/31/2013                  Skin: No rash or infections     Thyroid:  No  unusual fatigue.     The blood pressure has been normal       Has history of swelling of feet especially in the evening, taking Lasix twice a day.     No shortness of breath or chest tightness  on exertion.     Bowel habits: Normal.       Has mild frequency of urination, has nocturia 2-3 times at night      No joint  pains.           For several years has a history of Numbness, tingling  in feet and lower legs especially the right side. Relieved by gabapentin. Also has had persistent pain in the right lower leg from his episode of shingles   LABS:  Office Visit on 02/02/2014  Component Date Value Ref Range Status  . POC Glucose 02/02/2014 373* 70 - 99 mg/dl Final    Physical Examination:  BP 136/69  Pulse 53  Temp(Src) 98 F (36.7 C)  Resp 18  Ht 5\' 11"  (1.803 m)  Wt 237 lb 9.6 oz (107.775 kg)  BMI 33.15 kg/m2  SpO2 97%  GENERAL:         Patient has generalized obesity.   HEENT:         Eye exam shows normal external appearance. Fundus exam shows no retinopathy. Oral exam shows normal mucosa .  NECK:         General:  Neck exam shows no lymphadenopathy. Carotids are normal to palpation and no bruit heard.  Thyroid is not enlarged and no nodules felt.   LUNGS:         Chest is symmetrical. Lungs are clear to auscultation.Marland Kitchen   HEART:         Heart sounds:  S1 and S2 are normal. No murmurs or clicks heard., no S3  or S4.   ABDOMEN:   There is no distention present. Liver and spleen are not palpable. No other mass or tenderness present.  EXTREMITIES:     There is no edema. No skin lesions present.Marland Kitchen  NEUROLOGICAL:   Vibration sense is absent in toes. Ankle jerks are absent bilaterally.          Diabetic foot exam:  Diabetic foot exam shows normal monofilament sensation in the toes and plantar surfaces, no skin lesions or ulcers on the feet and normal pedal pulses MUSCULOSKELETAL:       There is no enlargement or deformity of the joints. Spine is normal to inspection.Marland Kitchen   SKIN:  No rash or other lesions       ASSESSMENT:  Diabetes type 2, uncontrolled with obesity He is only on insulin and has significant obesity along with insulin resistance Currently with his regimen of premixed insulin his A1c has been consistently over 8% Discussed with him that he is not getting a physiological regimen of insulin and variable control of postprandial readings including a tendency to hypoglycemia after breakfast and probable hyperglycemia after dinner Other problems are detailed in the history of present illness and includes:  High-fat diet  Lack of structured exercise although he is relatively active at work  Lack of glucose monitoring recently  Difficulty with weight loss     Complications: He has long-standing symptoms of mild neuropathy  PLAN:  He is a good candidate for a drug like Invokana to benefit his diabetes control and obesity. Discussed action of SGLT 2 drugs on lowering glucose by decreasing kidney absorption of glucose, benefits of weight loss and lower blood pressure, possible side effects including candidiasis and dosage regimen.  For convenience he will start with a combination of Invokana and metformin as the metformin will help potentiate the action and also help with insulin resistance; currently has significant fasting hyperglycemia also Switch to a regimen of basal bolus insulin using  NovoLog and Lantus. Start with 50 units of Lantus and adjust every 3 days based on fasting blood sugars. Discussed how to adjust this Diabetes teaching to be done by nurse educator today, but also needs formal meal planning session with dietitian  Patient Instructions  LANTUS insulin: This insulin provides blood sugar control for up to 24 hours.  Start with 50 units at bedtime daily and increase by 4 units every 3 days until the waking up sugars are under 130.  Then continue the same dose. If blood sugar is under 90 for 2 days in a row, reduce the dose by 2 units.  Note that this insulin does not control the rise of blood sugar with meals  NOVOLOG insulin: Take 10 units before breakfast, 15 units before lunch and supper Check blood sugars at least once a day about 2 hours after any meal If the glucose is over 180 after any given meal will need to increase the NovoLog by 2 units for that meal   If the glucose is going below 100 after the meal may reduce the dose by 2 units  Invokamet with breakfast for 3 days and then twice a day with food You may notice slight increase in urination with this. Recommend stopping the fluid pill in the evening  May use OTC Lotrimin antifungal cream if having a yeast infection  Bring blood sugar monitor on each visit  Low fat diet    Counseling time over 50% of today's 60 minute visit  Danny [name] 02/02/2014, 9:29 AM   Note: This office note was prepared with Insurance underwriter. Any transcriptional errors that result from this process are unintentional.

## 2014-02-03 ENCOUNTER — Ambulatory Visit (INDEPENDENT_AMBULATORY_CARE_PROVIDER_SITE_OTHER): Payer: BC Managed Care – PPO | Admitting: Internal Medicine

## 2014-02-03 ENCOUNTER — Encounter: Payer: Self-pay | Admitting: Endocrinology

## 2014-02-03 ENCOUNTER — Encounter: Payer: Self-pay | Admitting: Internal Medicine

## 2014-02-03 VITALS — BP 121/70 | HR 73 | Temp 98.3°F | Wt 237.0 lb

## 2014-02-03 DIAGNOSIS — M25572 Pain in left ankle and joints of left foot: Secondary | ICD-10-CM

## 2014-02-03 DIAGNOSIS — M25579 Pain in unspecified ankle and joints of unspecified foot: Secondary | ICD-10-CM

## 2014-02-03 DIAGNOSIS — B029 Zoster without complications: Secondary | ICD-10-CM

## 2014-02-03 MED ORDER — GABAPENTIN 600 MG PO TABS: 600.0000 mg | ORAL_TABLET | Freq: Three times a day (TID) | ORAL | Status: DC

## 2014-02-03 NOTE — Progress Notes (Signed)
Subjective:    Patient ID: Danny [name], male    DOB: Jun 04, 1961, 53 y.o.   MRN: [id number]  DOS:  02/03/2014 Type of visit - description: Followup from previous visit History: Right leg pain, shingles---  No further blisters, he continue with a burning sensation of the whole right foot mostly at the distal area. Neurontin help temporarily. Unfortunately he injured his left foot, under the care of orthopedic surgery, under workers comp. He met his new endocrinologist, changes were made. The patient is optimistic   ROS Denies chest pain or  difficulty breathing No nausea, vomiting, diarrhea Denies  excessive somnolence with Neurontin  Past Medical History  Diagnosis Date  . Diabetes mellitus     Type II dx 1999  . Hyperlipidemia   . Hypertension   . Benign prostatic hypertrophy     s/p TUNA procedure aprx 2006,  . Carpal tunnel syndrome 06/17/2009  . HYPERSOMNIA UNSPECIFIED 12/11/2007  . INSOMNIA-SLEEP DISORDER-UNSPEC 04/12/2010  . Allergic rhinitis   . History of cardiovascular stress test 8/11    Negative  . ED (erectile dysfunction)     After TUNA  . Elevated LFTs 2010    (-) chronic hep  panel  . Atrial fibrillation 12/21/2009    paroxysmal. Failed Multaq    Past Surgical History  Procedure Laterality Date  . Vasectomy      several years ago  . Prostate surgery      TUNA  . Cystoscopy  11-09    neg  @ urology, was rx Flomax    History   Social History  . Marital Status: Married    Spouse Name: N/A    Number of Children: 2  . Years of Education: N/A   Occupational History  . 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 Smoking status: Former Smoker    Types: Cigarettes    Quit date: 06/26/1985  . Smokeless tobacco: Never Used  . Alcohol Use: No     Comment:    . Drug Use: No  . Sexual Activity: Not on file   Other Topics Concern  . Not on file   Social History Narrative   has  a girlfriend, lives in [place]                 Medication List       This list is accurate as of: 02/03/14  5:04 PM.  Always use your most recent med list.               Canagliflozin-Metformin HCl 50-1000 MG Tabs  Commonly known as:  INVOKAMET  Take 1 tablet twice a day     fish oil-omega-3 fatty acids 1000 MG capsule  Take 2 g by mouth 2 (two) times daily.     flecainide 100 MG tablet  Commonly known as:  TAMBOCOR  TAKE ONE TABLET BY MOUTH TWICE DAILY     fluticasone 50 MCG/ACT nasal spray  Commonly known as:  FLONASE  USE TWO SPRAYS IN EACH NOSTRIL ONCE DAILY     furosemide 20 MG tablet  Commonly known as:  LASIX  Take 1 tablet (20 mg total) by mouth 2 (two) times daily.     gabapentin 600 MG tablet  Commonly known as:  NEURONTIN  Take 1 tablet (600 mg total) by mouth 3 (three) times daily.     glucose blood test strip  Commonly known as:  ONETOUCH VERIO  Use to check blood sugar twice daily.     HYDROcodone-acetaminophen 5-325 MG per tablet  Commonly known as:  NORCO/VICODIN  Take 1-2 tablets by mouth every 8 (eight) hours as needed.     insulin aspart 100 UNIT/ML FlexPen  Commonly known as:  NOVOLOG FLEXPEN  Inject 15 Units into the skin 3 (three) times daily with meals.     Insulin Glargine 100 UNIT/ML Solostar Pen  Commonly known as:  LANTUS SOLOSTAR  Inject 60 Units into the skin daily.     Insulin Syringe-Needle U-100 31G X 5/16" 0.5 ML Misc  Commonly known as:  RELION INSULIN SYR 0.5ML/31G  USE ONE SYRINGE THREE TO FOUR TIMES DAILY     loratadine 10 MG tablet  Commonly known as:  CLARITIN  Take 10 mg by mouth daily as needed for allergies.     metoprolol succinate 25 MG 24 hr tablet  Commonly known as:  TOPROL-XL  TAKE 1 TABLET BY MOUTH EVERY DAY     ONETOUCH DELICA LANCETS 82U Misc  USE ONE UNIT TO CHECK GLUCOSE TWICE DAILY     ONETOUCH VERIO SYNC SYSTEM W/DEVICE Kit  1 Device by Does not apply route once.     PRADAXA 150 MG Caps  capsule  Generic drug:  dabigatran  TAKE ONE CAPSULE BY MOUTH EVERY 12 HOURS     simvastatin 20 MG tablet  Commonly known as:  ZOCOR  TAKE 1/2 TABLET BY MOUTH EVERY DAY     tamsulosin 0.4 MG Caps capsule  Commonly known as:  FLOMAX  Take 1 capsule (0.4 mg total) by mouth daily.           Objective:   Physical Exam BP 121/70  Pulse 73  Temp(Src) 98.3 F (36.8 C)  Wt 237 lb (107.502 kg)  SpO2 95% General -- alert, well-developed, NAD.   Lungs -- normal respiratory effort, no intercostal retractions, no accessory muscle use, and normal breath sounds.  Heart-- normal rate, regular rhythm, no murmur.  Extremities-- + swelling around the L ankle  Neurologic--  alert & oriented X3.   Psych-- Cognition and judgment appear intact. Cooperative with normal attention span and concentration. No anxious or depressed appearing.     Assessment & Plan:

## 2014-02-03 NOTE — Assessment & Plan Note (Signed)
Left foot pain was better unfortunately he recently had a left ankle avulsion fracture, now the care of Dr. Deberah CastleYaste (workers comp)

## 2014-02-03 NOTE — Patient Instructions (Signed)
Next visit in 3-4 months for a physical

## 2014-02-03 NOTE — Progress Notes (Signed)
Pre visit review using our clinic review tool, if applicable. No additional management support is needed unless otherwise documented below in the visit note. 

## 2014-02-03 NOTE — Assessment & Plan Note (Addendum)
Since the last time he was here, he had Valtrex, no further blisters but has persistent burning feeling in the right foot consistent with postherpetic neuralgia. Currently on Neurontin 400 mg 3 times a day which help temporarily. Plan: Increase Neurontin to 600 mg 3 times a day, if not helping enough, he will let me know. Otherwise follow up in few months

## 2014-02-08 ENCOUNTER — Other Ambulatory Visit: Payer: Self-pay | Admitting: Internal Medicine

## 2014-02-09 ENCOUNTER — Encounter: Payer: BC Managed Care – PPO | Admitting: Nutrition

## 2014-02-11 ENCOUNTER — Encounter: Payer: Self-pay | Admitting: Internal Medicine

## 2014-02-11 ENCOUNTER — Ambulatory Visit (INDEPENDENT_AMBULATORY_CARE_PROVIDER_SITE_OTHER): Payer: BC Managed Care – PPO | Admitting: Internal Medicine

## 2014-02-11 VITALS — BP 152/79 | HR 56 | Ht 71.0 in | Wt 236.0 lb

## 2014-02-11 DIAGNOSIS — I10 Essential (primary) hypertension: Secondary | ICD-10-CM

## 2014-02-11 DIAGNOSIS — R0789 Other chest pain: Secondary | ICD-10-CM

## 2014-02-11 DIAGNOSIS — I4819 Other persistent atrial fibrillation: Secondary | ICD-10-CM

## 2014-02-11 DIAGNOSIS — I4891 Unspecified atrial fibrillation: Secondary | ICD-10-CM

## 2014-02-11 DIAGNOSIS — E1165 Type 2 diabetes mellitus with hyperglycemia: Secondary | ICD-10-CM

## 2014-02-11 DIAGNOSIS — IMO0001 Reserved for inherently not codable concepts without codable children: Secondary | ICD-10-CM

## 2014-02-11 NOTE — Patient Instructions (Addendum)
Your physician recommends that you continue on your current medications as directed. Please refer to the Current Medication list given to you today.  Your physician has requested that you have an echocardiogram. Echocardiography is a painless test that uses sound waves to create images of your heart. It provides your doctor with information about the size and shape of your heart and how well your heart's chambers and valves are working. This procedure takes approximately one hour. There are no restrictions for this procedure.  Your physician has requested that you have a lexiscan myoview. For further information please visit [email or web address]. Please follow instruction sheet, as given.  Your physician has recommended that you have an ablation. Catheter ablation is a medical procedure used to treat some cardiac arrhythmias (irregular heartbeats). During catheter ablation, a long, thin, flexible tube is put into a blood vessel in your groin (upper thigh), or neck. This tube is called an ablation catheter. It is then guided to your heart through the blood vessel. Radio frequency waves destroy small areas of heart tissue where abnormal heartbeats may cause an arrhythmia to start. Please see the instruction sheet given to you today.  Your physician has requested that you have a TEE. During a TEE, sound waves are used to create images of your heart. It provides your doctor with information about the size and shape of your heart and how well your heart's chambers and valves are working. In this test, a transducer is attached to the end of a flexible tube that's guided down your throat and into your esophagus (the tube leading from you mouth to your stomach) to get a more detailed image of your heart. You are not awake for the procedure. Please see the instruction sheet given to you today. For further information please visit [email or web address].

## 2014-02-11 NOTE — Progress Notes (Signed)
Primary Care Physician: [name], MD Referring Physician:  Dr Rolene Course is a 53 y.o. male with a h/o paroxysmal atrial fibrillation who presents today for EP consultation.  He reports initially being diagnosed with atrial fibrillation in 2011 after presenting with palpitations and fatigue.  He was placed on multaq at that time.  He did not find significant improvement with this medicine.  He was subsequently placed on flecainide.  He did well with flecainide initially.  Unfortunately, he has had recurrent afib despite flecainide.  Presently, he reports that he has afib about 1-2 times per month, lasting several hours to several days.  He reports that he feels "rough" during afib.  He reports SOB and decreased exercise tolerance.  He has no energy and reports that he has to "lay down".  He is unaware of any triggers or precipitants (other than stress). He denies alcohol use, no significant snoring history. Is obese and admits he does not limit anything in his diet. Is pending a referral to a nutritionist.  Initially evaluated for catheter ablation in March of 2014 but at that time was concerned about the cost and outcome of the procedure. Has been referred again today to be reconsidered for catheter ablation. He is now ready to pursue the procedure due to ongoing symptomatic PAF.He continues on flecainide 100 mg twice a day as well as Toprol-XL 25 mg daily. Does report bradycardia and over the last week has noticed chest heaviness/ pressure when he has a heart rate in the low 50s/upper 40s. Today in the office his EKG shows a sinus bradycardia at 56 beats per minute. Long standing history of diabetes mellitus, insulin requiring. He recently was seen by endocrinology who changed his management due to poor control, hemoglobin A1c usually around 8. Echocardiogram  2013 showed mild left atrial enlargement with mild MR and mild LVH. He has not had a stress test since 2011.  Today, he denies  symptoms of , shortness of breath, orthopnea, PND,  dizziness, presyncope, syncope, or neurologic sequela.  He suffered a recent avulsion fracture of his left ankle and is wearing an ankle brace.Danny [name] He is currently on light duty for work. He does have some ongoing chest pressure that he has noticed this week usually when his heart rate is slow.  Past Medical History  Diagnosis Date  . Diabetes mellitus     Type II dx 1999  . Hyperlipidemia   . Hypertension   . Benign prostatic hypertrophy     s/p TUNA procedure aprx 2006,  . Carpal tunnel syndrome 06/17/2009  . HYPERSOMNIA UNSPECIFIED 12/11/2007  . INSOMNIA-SLEEP DISORDER-UNSPEC 04/12/2010  . Allergic rhinitis   . History of cardiovascular stress test 8/11    Negative  . ED (erectile dysfunction)     After TUNA  . Elevated LFTs 2010    (-) chronic hep  panel  . Atrial fibrillation 12/21/2009    paroxysmal. Failed Multaq   Past Surgical History  Procedure Laterality Date  . Vasectomy      several years ago  . Prostate surgery      TUNA  . Cystoscopy  11-09    neg  @ urology, was rx Flomax    Current Outpatient Prescriptions  Medication Sig Dispense Refill  . Blood Glucose Monitoring Suppl (ONETOUCH VERIO [facility] SYSTEM) W/DEVICE KIT 1 Device by Does not apply route once.  1 kit  0  . Canagliflozin-Metformin HCl (INVOKAMET) 50-1000 MG TABS Take 1 tablet twice  a day  60 tablet  3  . fish oil-omega-3 fatty acids 1000 MG capsule Take 2 g by mouth 2 (two) times daily.        . flecainide (TAMBOCOR) 100 MG tablet TAKE ONE TABLET BY MOUTH TWICE DAILY  180 tablet  1  . fluticasone (FLONASE) 50 MCG/ACT nasal spray USE TWO SPRAY(S) IN EACH NOSTRIL ONCE DAILY  16 g  1  . furosemide (LASIX) 20 MG tablet Take 1 tablet (20 mg total) by mouth 2 (two) times daily.  180 tablet  3  . gabapentin (NEURONTIN) 600 MG tablet Take 1 tablet (600 mg total) by mouth 3 (three) times daily.  90 tablet  6  . glucose blood (ONETOUCH VERIO) test strip Use to  check blood sugar twice daily.  100 each  3  . HYDROcodone-acetaminophen (NORCO/VICODIN) 5-325 MG per tablet Take 1-2 tablets by mouth every 8 (eight) hours as needed.  30 tablet  0  . insulin aspart (NOVOLOG FLEXPEN) 100 UNIT/ML FlexPen Inject 15 Units into the skin 3 (three) times daily with meals.  15 mL  11  . Insulin Glargine (LANTUS SOLOSTAR) 100 UNIT/ML Solostar Pen Inject 60 Units into the skin daily.  5 pen  3  . Insulin Syringe-Needle U-100 (RELION INSULIN SYR 0.5ML/31G) 31G X 5/16" 0.5 ML MISC USE ONE SYRINGE THREE TO FOUR TIMES DAILY  100 each  3  . loratadine (CLARITIN) 10 MG tablet Take 10 mg by mouth daily as needed for allergies.      . metoprolol succinate (TOPROL-XL) 25 MG 24 hr tablet TAKE 1 TABLET BY MOUTH EVERY DAY  30 tablet  6  . ONETOUCH DELICA LANCETS 62H MISC USE ONE UNIT TO CHECK GLUCOSE TWICE DAILY  100 each  11  . PRADAXA 150 MG CAPS capsule TAKE ONE CAPSULE BY MOUTH EVERY 12 HOURS  60 capsule  4  . simvastatin (ZOCOR) 20 MG tablet TAKE 1/2 TABLET BY MOUTH EVERY DAY  90 tablet  0  . tamsulosin (FLOMAX) 0.4 MG CAPS capsule Take 1 capsule (0.4 mg total) by mouth daily.  30 capsule  11   No current facility-administered medications for this visit.    Allergies  Allergen Reactions  . Metformin     REACTION: pt states caused liver injury some years ago    History   Social History  . Marital Status: Married    Spouse Name: N/A    Number of Children: 2  . Years of Education: N/A   Occupational History  . 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 Smoking status: Former Smoker    Types: Cigarettes    Quit date: 06/26/1985  . Smokeless tobacco: Never Used  . Alcohol Use: No     Comment:    . Drug Use: No  . Sexual Activity: Not on file   Other Topics Concern  . Not on file   Social History Narrative   has a girlfriend, lives in [place]             Family History  Problem Relation  Age of Onset  . Heart disease Mother     M-- MI,CHF  . Diabetes Mother   . Hypertension Mother   . Hypertension Father   . Cancer Neg Hx     Negative FH of Colon or Prostate Cancer    ROS- All systems are reviewed and negative  except as per the HPI above  Physical Exam: Filed Vitals:   02/11/14 0930  BP: 152/79  Pulse: 56  Height: _0  (1.803 m)  Weight: 107.049 kg (236 lb)    GEN- The patient is well appearing, alert and oriented x 3 today.   Head- normocephalic, atraumatic Eyes-  Sclera clear, conjunctiva pink Ears- hearing intact Oropharynx- clear Neck- supple, no JVP Lymph- no cervical lymphadenopathy Lungs- Clear to ausculation bilaterally, normal work of breathing Heart- Regular rate and rhythm, no murmurs, rubs or gallops, PMI not laterally displaced GI- soft, NT, ND, + BS Extremities- no clubbing, cyanosis, or edema. Left ankle in a soft brace MS- no significant deformity or atrophy Skin- no rash or lesion Psych- euthymic mood, full affect Neuro- strength and sensation are intact  EKG today reveals sinus bradycardia at 56 beats per minute otherwise normal EKG Echo and Dr Lonia Skinner notes are reviewed  Assessment and Plan:  1. Paroxysmal atrial fibrillation  The patient has very symptomatic paroxysmal atrial fibrillation.  He has failed medical therapy with multaq and flecainide. He is appropriately anticoagulated with pradaxa.  Therapeutic strategies for afib including medicine and ablation were discussed in detail with the patient today. Risk, benefits, and alternatives to EP study and radiofrequency ablation for afib were also discussed in detail today.  I think that he would be a good candidate for ablation.  These risks include but are not limited to stroke, bleeding, vascular damage, tamponade, perforation, damage to the esophagus, lungs, and other structures, pulmonary vein stenosis, worsening renal function, and death. The patient understands these risk  and wishes to pursue procedure.  Repeat Echo for any structural changes that might have occurred since 2013 with pending PVI.  2. HTN Elevated initially, recheck at 142/78. Avoid salt and weight loss encouraged.  3. Bradycardia Continue flecainide 100 mg bid but reduce metoprolol to 12.5 mg a day. SVC isolation at time of ablation due to history of bradycardia.  4. Chest pain with risk factors for CAD Lexi myoview   5. Chadsvasc score of 2( HTN and age) Stressed not to miss any doses of pradaxa prior to procedure but to hold am of procedure.

## 2014-02-13 ENCOUNTER — Encounter: Payer: Self-pay | Admitting: Cardiovascular Disease

## 2014-02-17 ENCOUNTER — Encounter: Payer: BC Managed Care – PPO | Attending: Endocrinology | Admitting: Nutrition

## 2014-02-17 DIAGNOSIS — Z713 Dietary counseling and surveillance: Secondary | ICD-10-CM | POA: Insufficient documentation

## 2014-02-17 DIAGNOSIS — IMO0001 Reserved for inherently not codable concepts without codable children: Secondary | ICD-10-CM | POA: Insufficient documentation

## 2014-02-17 DIAGNOSIS — E1165 Type 2 diabetes mellitus with hyperglycemia: Principal | ICD-10-CM

## 2014-02-17 DIAGNOSIS — Z794 Long term (current) use of insulin: Secondary | ICD-10-CM | POA: Insufficient documentation

## 2014-02-17 NOTE — Progress Notes (Signed)
Patient is here with his wife to review blood sugars and insulin doses.   Meter shows patient is testing 2-4 times per day.  Last 5 days: FBSs 130s, acL 124-130, acS130-174, HS: 85-127.  Pt. Reports that theses sugars are" the best he has ever seen them".  Medication:  He has increased his Lantus dose appropriately, now taking 58u.  Humalog:  16-18acB, 23-25 acL and acS.    Diet:  bfast is 2 eggs, 3 pieces of bacon, 8 ounces of orange julius, and 1 piece of toast with butter. Lunch is pimento cheese sandwich with 10 chips, yogurt and a piece of fruit, with water to drink Supper is 6 ounces of meat, 2 veg. (1 starchy), and fruit or bread.  HS snack: fruit or yogurt He reports that this is much less than he was eating just 3 weeks ago.  Is pleased with himself for his diet changes.  I simple suggested that he try to find some calorie free orange drink to have at breakfast.  Some suggestions were given for this.  No changes in insulin doses made.  We reviewed low blood sugar symtoms and treatments and he has had none since starting this insulin regimen.  His wife has found him unconscious twice on his old insulin.  She requested Glucagon copay card and another prescription for this.  We reviewed the fact that when his blood sugars come down, the symtoms of low blood sugar may change, and the insulin may need to be reduced.  We reviewed how/and which insulins to reduce, and written instructions were given for this.  They both reported good understanding of this.  Exercise:  He has broken his ankle at work several weeks ago, and is now limping, but is able to put weight on it.  He reports being very active at work, and at home in the evenings and on weekends.  We reviewed the need to reduce the Humalog before the meals, when his ability to move around without pain increases.  They had no final questions

## 2014-02-18 ENCOUNTER — Other Ambulatory Visit: Payer: Self-pay | Admitting: *Deleted

## 2014-02-18 MED ORDER — GLUCAGON (RDNA) 1 MG IJ KIT: 1.0000 mg | PACK | Freq: Once | INTRAMUSCULAR | Status: DC | PRN

## 2014-02-19 ENCOUNTER — Ambulatory Visit (HOSPITAL_BASED_OUTPATIENT_CLINIC_OR_DEPARTMENT_OTHER): Payer: BC Managed Care – PPO | Admitting: Radiology

## 2014-02-19 ENCOUNTER — Ambulatory Visit (HOSPITAL_COMMUNITY): Payer: BC Managed Care – PPO | Attending: Cardiovascular Disease

## 2014-02-19 ENCOUNTER — Other Ambulatory Visit: Payer: Self-pay | Admitting: Endocrinology

## 2014-02-19 ENCOUNTER — Encounter: Payer: Self-pay | Admitting: *Deleted

## 2014-02-19 ENCOUNTER — Telehealth: Payer: Self-pay | Admitting: *Deleted

## 2014-02-19 VITALS — BP 136/73 | HR 44 | Ht 71.0 in | Wt 232.0 lb

## 2014-02-19 DIAGNOSIS — E1165 Type 2 diabetes mellitus with hyperglycemia: Principal | ICD-10-CM

## 2014-02-19 DIAGNOSIS — IMO0001 Reserved for inherently not codable concepts without codable children: Secondary | ICD-10-CM

## 2014-02-19 DIAGNOSIS — I4891 Unspecified atrial fibrillation: Secondary | ICD-10-CM

## 2014-02-19 DIAGNOSIS — R0602 Shortness of breath: Secondary | ICD-10-CM

## 2014-02-19 DIAGNOSIS — N400 Enlarged prostate without lower urinary tract symptoms: Secondary | ICD-10-CM | POA: Insufficient documentation

## 2014-02-19 DIAGNOSIS — Z87891 Personal history of nicotine dependence: Secondary | ICD-10-CM | POA: Diagnosis not present

## 2014-02-19 DIAGNOSIS — E119 Type 2 diabetes mellitus without complications: Secondary | ICD-10-CM | POA: Diagnosis not present

## 2014-02-19 DIAGNOSIS — E785 Hyperlipidemia, unspecified: Secondary | ICD-10-CM | POA: Diagnosis not present

## 2014-02-19 DIAGNOSIS — R079 Chest pain, unspecified: Secondary | ICD-10-CM

## 2014-02-19 DIAGNOSIS — Z01812 Encounter for preprocedural laboratory examination: Secondary | ICD-10-CM

## 2014-02-19 DIAGNOSIS — G5600 Carpal tunnel syndrome, unspecified upper limb: Secondary | ICD-10-CM | POA: Diagnosis not present

## 2014-02-19 DIAGNOSIS — G4710 Hypersomnia, unspecified: Secondary | ICD-10-CM | POA: Diagnosis not present

## 2014-02-19 DIAGNOSIS — I4819 Other persistent atrial fibrillation: Secondary | ICD-10-CM

## 2014-02-19 DIAGNOSIS — R0789 Other chest pain: Secondary | ICD-10-CM

## 2014-02-19 DIAGNOSIS — I10 Essential (primary) hypertension: Secondary | ICD-10-CM | POA: Diagnosis not present

## 2014-02-19 MED ORDER — TECHNETIUM TC 99M SESTAMIBI GENERIC - CARDIOLITE
10.0000 | Freq: Once | INTRAVENOUS | Status: AC | PRN
  Administered 2014-02-19: 10 via INTRAVENOUS

## 2014-02-19 MED ORDER — REGADENOSON 0.4 MG/5ML IV SOLN
0.4000 mg | Freq: Once | INTRAVENOUS | Status: AC
  Administered 2014-02-19: 0.4 mg via INTRAVENOUS

## 2014-02-19 MED ORDER — TECHNETIUM TC 99M SESTAMIBI GENERIC - CARDIOLITE
30.0000 | Freq: Once | INTRAVENOUS | Status: AC | PRN
  Administered 2014-02-19: 30 via INTRAVENOUS

## 2014-02-19 NOTE — Telephone Encounter (Signed)
Instructed patient on date/time of tee and ablation. Will come 9/22 for labs. Advised on holding medications and will mail instruction letter today.

## 2014-02-19 NOTE — Progress Notes (Signed)
2D Echo completed. 02/19/2014 

## 2014-02-19 NOTE — Progress Notes (Signed)
[facility] SITE 3 NUCLEAR MED [address] [place], [place] [address] [phone]    Cardiology Nuclear Med Study  Danny [name] is a 53 y.o. male     MRN : [id number]     DOB: Apr 15, 1961  Procedure Date: 02/19/2014  Nuclear Med Background Indication for Stress Test:  Evaluation for Ischemia and ablation for atrial fibrillation History:  Afib, Echo 2013 EF 55-65%, MPI 2011 (normal) EF 69% Cardiac Risk Factors: Family History - CAD, History of Smoking and IDDM Type 2  Symptoms:  Chest Pain (last date of chest discomfort was one week ago) and SOB   Nuclear Pre-Procedure Caffeine/Decaff Intake:  None NPO After: 7:30pm   Lungs:  clear O2 Sat: 96% on room air. IV 0.9% NS with Angio Cath:  22g  IV Site: R Hand  IV Started by:  Bonnita Levan, RN  Chest Size (in):  48 Cup Size: n/a  Height:  (1.803 m)  Weight:  232 lb (105.235 kg)  BMI:  Body mass index is 32.37 kg/(m^2). Tech Comments:  CBG@ 5 am=131    Nuclear Med Study 1 or 2 day study: 1 day  Stress Test Type:  Lexiscan  Reading MD: N/A  Order Authorizing Provider:  Hillis Range, MD  Resting Radionuclide: Technetium 72m Sestamibi  Resting Radionuclide Dose: 11.0 mCi   Stress Radionuclide:  Technetium 31m Sestamibi  Stress Radionuclide Dose: 33.0 mCi           Stress Protocol Rest HR: 44 Stress HR: 71  Rest BP: 136/73 Stress BP: 132/76  Exercise Time (min): n/a METS: n/a           Dose of Adenosine (mg):  n/a Dose of Lexiscan: 0.4 mg  Dose of Atropine (mg): n/a Dose of Dobutamine: n/a mcg/kg/min (at max HR)  Stress Test Technologist: Nelson Chimes, BS-ES  Nuclear Technologist:  Harlow Asa, CNMT     Rest Procedure:  Myocardial perfusion imaging was performed at rest 45 minutes following the intravenous administration of Technetium 56m Sestamibi. Rest ECG: NSR - Normal EKG  Stress Procedure:  The patient received IV Lexiscan 0.4 mg over 15-seconds.  Technetium 2m Sestamibi injected at  30-seconds.  Quantitative spect images were obtained after a 45 minute delay.  During the infusion of Lexiscan the patient complained of SOB and fatigue.   These symptoms began to resolve in recovery.  Stress ECG: No significant change from baseline ECG  QPS Raw Data Images:  There is interference from nuclear activity from structures below the diaphragm. This does not affect the ability to read the study. Stress Images:  Normal homogeneous uptake in all areas of the myocardium. Rest Images:  Normal homogeneous uptake in all areas of the myocardium. Subtraction (SDS):  No evidence of ischemia. Transient Ischemic Dilatation (Normal <1.22):  1.27 Lung/Heart Ratio (Normal <0.45):  0.34  Quantitative Gated Spect Images QGS EDV:  141 ml QGS ESV:  62 ml  Impression Exercise Capacity:  Lexiscan with no exercise. BP Response:  Normal blood pressure response. Clinical Symptoms:  No significant symptoms noted. ECG Impression:  No significant ST segment change suggestive of ischemia. Comparison with Prior Nuclear Study: No significant change from previous study from 02/07/10.  Overall Impression:  Normal stress nuclear study.  LV Ejection Fraction: 56%.  LV Wall Motion:  NL LV Function; NL Wall Motion.   Vesta Mixer, Montez Hageman., MD, [facility] 02/19/2014, 4:01 PM [address]  [address] Office [phone] Pager [phone]

## 2014-02-25 ENCOUNTER — Encounter: Payer: Self-pay | Admitting: Endocrinology

## 2014-02-25 ENCOUNTER — Ambulatory Visit (INDEPENDENT_AMBULATORY_CARE_PROVIDER_SITE_OTHER): Payer: BC Managed Care – PPO | Admitting: Endocrinology

## 2014-02-25 VITALS — BP 148/82 | HR 73 | Temp 98.2°F | Resp 16 | Ht 71.0 in | Wt 240.0 lb

## 2014-02-25 DIAGNOSIS — E1165 Type 2 diabetes mellitus with hyperglycemia: Principal | ICD-10-CM

## 2014-02-25 DIAGNOSIS — I10 Essential (primary) hypertension: Secondary | ICD-10-CM

## 2014-02-25 DIAGNOSIS — IMO0001 Reserved for inherently not codable concepts without codable children: Secondary | ICD-10-CM

## 2014-02-25 NOTE — Patient Instructions (Signed)
Leave off Lasix  Lantus in pms, 56 units  Novolog 22 at lunch and supper

## 2014-02-25 NOTE — Progress Notes (Signed)
Patient ID: Danny [name], male   DOB: 1960-07-03, 53 y.o.   MRN: [id number]     Reason for Appointment: Followup for Type 2 Diabetes  Referring physician: Drue Novel  History of Present Illness:          Diagnosis: Type 2 diabetes mellitus, date of diagnosis: 1992       Past history:  He has had long-standing diabetes and previously was treated with metformin, Amaryl, Januvia and Actos Appears that he was taken off metformin because of fear of liver problems. May have been taken taken off Actos because of swelling of his legs but was on 45 mg at that time Over the last 2 years at least his A1c has been consistently over 8% He was started on insulin in 2011 probably when his A1c was over 9% Initially was given Lantus and Humalog and subsequently switched to 70/30 twice a day for unknown reasons Has not taken Byetta or Victoza in the past He had been on 70/30 insulin for the last few years  Recent history:  Because of persistent hyperglycemia and A1c consistently over 8% he was switched to a basal bolus insulin regimen with Lantus and NovoLog insulin 70/30 insulin He was also given Invokamet 50/1000 twice a day as he was not taking any other agents with insulin and had significant obesity He was instructed on adjusting his Lantus to get his morning sugars down and he has increased his dose only by 4 units to 54 He says his blood sugars had been excellent about a week ago but recently higher because of stress of his atrial fibrillation Mealtime insulin: He has gone up significantly on his mealtime doses from the initial dose of 10-15 units before meals His blood sugars have improved considerably overall including after meals Current blood sugar patterns indicate relatively low readings around lunchtime, in the afternoon and occasionally at bedtime also His lowest blood sugars are overall before supper and highest in the mornings He is subjectively feeling much better also No side effects  with Invokamet including GI side effects or candidiasis No further symptoms of hypoglycemia       Oral hypoglycemic drugs the patient is taking are: None    Side effects from medications have been: None INSULIN regimen is described as:  NovoLog 18-25-25 Lantus 54 at bedtime (variable timings)   Compliance with the medical regimen: Fair Hypoglycemia: None recently  Glucose monitoring:  done 4.1 time a day         Glucometer: One Touch.      Blood Glucose readings previous history by recall  PREMEAL Breakfast Lunch Dinner  PCS  Overall  Glucose range:  109-168   75-227   92-138   74-169    Mean/median:  143    115    131     Glycemic control:   Lab Results  Component Value Date   HGBA1C 8.1* 10/27/2013   HGBA1C 8.4* 07/17/2013   HGBA1C 8.3* 04/04/2013   Lab Results  Component Value Date   MICROALBUR 0.2 10/27/2013   LDLCALC 67 03/31/2013   CREATININE 1.05 01/15/2014    Retinal exam: annual    Self-care: The diet that the patient has been following is: None    Meals: 3 meals per day. Breakfast is eggs or cereal usually,  controlling portions           Exercise:  he is active with moving equipment at work and some walking, does drive a truck  Dietician visit: Most recent: years ago.               Weight history:  Wt Readings from Last 3 Encounters:  02/25/14 240 lb (108.863 kg)  02/19/14 232 lb (105.235 kg)  02/11/14 236 lb (107.049 kg)       Medication List       This list is accurate as of: 02/25/14  9:40 PM.  Always use your most recent med list.               Canagliflozin-Metformin HCl 50-1000 MG Tabs  Commonly known as:  INVOKAMET  Take 1 tablet twice a day     fish oil-omega-3 fatty acids 1000 MG capsule  Take 2 g by mouth 2 (two) times daily.     flecainide 100 MG tablet  Commonly known as:  TAMBOCOR  TAKE ONE TABLET BY MOUTH TWICE DAILY     fluticasone 50 MCG/ACT nasal spray  Commonly known as:  FLONASE  USE TWO SPRAY(S) IN EACH NOSTRIL ONCE  DAILY     furosemide 20 MG tablet  Commonly known as:  LASIX  Take 1 tablet (20 mg total) by mouth 2 (two) times daily.     gabapentin 600 MG tablet  Commonly known as:  NEURONTIN  Take 1 tablet (600 mg total) by mouth 3 (three) times daily.     glucagon 1 MG injection  Commonly known as:  GLUCAGON EMERGENCY  Inject 1 mg into the vein once as needed.     glucose blood test strip  Commonly known as:  ONETOUCH VERIO  Use to check blood sugar twice daily.     insulin aspart 100 UNIT/ML FlexPen  Commonly known as:  NOVOLOG FLEXPEN  Inject 15 Units into the skin 3 (three) times daily with meals.     Insulin Glargine 100 UNIT/ML Solostar Pen  Commonly known as:  LANTUS SOLOSTAR  Inject 60 Units into the skin daily.     Insulin Syringe-Needle U-100 31G X 5/16" 0.5 ML Misc  Commonly known as:  RELION INSULIN SYR 0.5ML/31G  USE ONE SYRINGE THREE TO FOUR TIMES DAILY     loratadine 10 MG tablet  Commonly known as:  CLARITIN  Take 10 mg by mouth daily as needed for allergies.     metoprolol succinate 25 MG 24 hr tablet  Commonly known as:  TOPROL-XL  TAKE 1/2 TABLET BY MOUTH EVERY DAY     ONETOUCH DELICA LANCETS 33G Misc  USE ONE UNIT TO CHECK GLUCOSE TWICE DAILY     ONETOUCH VERIO SYNC SYSTEM W/DEVICE Kit  1 Device by Does not apply route once.     PRADAXA 150 MG Caps capsule  Generic drug:  dabigatran  TAKE ONE CAPSULE BY MOUTH EVERY 12 HOURS     simvastatin 20 MG tablet  Commonly known as:  ZOCOR  TAKE 1/2 TABLET BY MOUTH EVERY DAY     tamsulosin 0.4 MG Caps capsule  Commonly known as:  FLOMAX  Take 1 capsule (0.4 mg total) by mouth daily.        Allergies:  Allergies  Allergen Reactions  . Metformin     REACTION: pt states caused liver injury some years ago    Past Medical History  Diagnosis Date  . Diabetes mellitus     Type II dx 1999  . Hyperlipidemia   . Hypertension   . Benign prostatic hypertrophy     s/p TUNA procedure aprx 2006,  . Carpal  tunnel  syndrome 06/17/2009  . HYPERSOMNIA UNSPECIFIED 12/11/2007  . INSOMNIA-SLEEP DISORDER-UNSPEC 04/12/2010  . Allergic rhinitis   . History of cardiovascular stress test 8/11    Negative  . ED (erectile dysfunction)     After TUNA  . Elevated LFTs 2010    (-) chronic hep  panel  . Atrial fibrillation 12/21/2009    paroxysmal. Failed Multaq    Past Surgical History  Procedure Laterality Date  . Vasectomy      several years ago  . Prostate surgery      TUNA  . Cystoscopy  11-09    neg  @ urology, was rx Flomax    Family History  Problem Relation Age of Onset  . Heart disease Mother     M-- MI,CHF  . Diabetes Mother   . Hypertension Mother   . Hypertension Father   . Cancer Neg Hx     Negative FH of Colon or Prostate Cancer    Social History:  reports that he quit smoking about 28 years ago. His smoking use included Cigarettes. He smoked 0.00 packs per day. He has never used smokeless tobacco. He reports that he does not drink alcohol or use illicit drugs.    Review of Systems       Lipids: He has been on low dose simvastatin for a few years       Lab Results  Component Value Date   CHOL 120 03/31/2013   HDL 32.30* 03/31/2013   LDLCALC 67 03/31/2013   TRIG 103.0 03/31/2013   CHOLHDL 4 03/31/2013                     For several years has a history of Numbness, tingling  in feet and lower legs especially the right side. Relieved by gabapentin. Also has had less symptoms when his blood sugars were improved, having some symptoms now.  He has had persistent pain in the right lower leg from his episode of shingles  Lasix was reduced to once a day instead of twice a day with starting Invokana, no recent edema   LABS:  No visits with results within 1 Week(s) from this visit. Latest known visit with results is:  Office Visit on 02/02/2014  Component Date Value Ref Range Status  . POC Glucose 02/02/2014 373* 70 - 99 mg/dl Final    Physical Examination:  BP  148/82  Pulse 73  Temp(Src) 98.2 F (36.8 C)  Resp 16  Ht 5\' 11"  (1.803 m)  Wt 240 lb (108.863 kg)  BMI 33.49 kg/m2  SpO2 92%  No pedal edema  ASSESSMENT/PLAN:    Diabetes type 2  with obesity As discussed in history of present illness he has had significant improvement in his blood sugars with switching to a basal bolus insulin regimen along with starting Invokana He has been tolerating the oral agents and has been much more compliant with his glucose monitoring since last time Subjectively feeling much better overall and also neuropathic symptoms are less He is also trying to improve his diet which was previously relatively high fat Currently he is taking relatively large doses of mealtime insulin; discussed that since his postprandial readings are relatively lower he can reduce his doses at least at lunch and supper Fasting glucose: Recently appears higher, not clear why because he has been compliant with his Lantus; however taking Lantus at variable times at night when he forgets He will take his Lantus for convenience earlier in the evenings and  increase the dose at least 2 units, discussed blood sugar fasting target to be at least under 130  Also discussed postprandial targets  He is concerned about the cost of his insulin and discussed switching to [place]jeo instead of Lantus, co-pay card was given Co-pay card for NovoLog given  History of edema: Not present now and he is benefiting from [place], may try to take Lasix only as needed   Patient Instructions  Leave off Lasix  Lantus in pms, 56 units  Novolog 22 at lunch and supper   Counseling time over 50% of today's 25 minute visit  Kydan Shanholtzer 02/25/2014, 9:40 PM   Note: This office note was prepared with Insurance underwriter. Any transcriptional errors that result from this process are unintentional.

## 2014-02-26 LAB — COMPREHENSIVE METABOLIC PANEL
ALT: 51 U/L (ref 0–53)
AST: 32 U/L (ref 0–37)
Albumin: 4.2 g/dL (ref 3.5–5.2)
Alkaline Phosphatase: 87 U/L (ref 39–117)
BILIRUBIN TOTAL: 0.6 mg/dL (ref 0.2–1.2)
BUN: 20 mg/dL (ref 6–23)
CO2: 27 mEq/L (ref 19–32)
CREATININE: 1.1 mg/dL (ref 0.4–1.5)
Calcium: 9.5 mg/dL (ref 8.4–10.5)
Chloride: 103 mEq/L (ref 96–112)
GFR: 70.85 mL/min (ref >60.00)
Glucose, Bld: 107 mg/dL — ABNORMAL HIGH (ref 70–99)
POTASSIUM: 4.2 meq/L (ref 3.5–5.1)
Sodium: 139 mEq/L (ref 135–145)
Total Protein: 7.7 g/dL (ref 6.0–8.3)

## 2014-03-03 ENCOUNTER — Telehealth: Payer: Self-pay | Admitting: Internal Medicine

## 2014-03-03 NOTE — Telephone Encounter (Signed)
°  Patient is returning call for ECHO results. Please call and advise.

## 2014-03-03 NOTE — Telephone Encounter (Signed)
Called pt and informed him of his echo results per Dr Johney Frame.  Pt verbalized understanding.

## 2014-03-17 ENCOUNTER — Other Ambulatory Visit (INDEPENDENT_AMBULATORY_CARE_PROVIDER_SITE_OTHER): Payer: BC Managed Care – PPO

## 2014-03-17 DIAGNOSIS — Z01812 Encounter for preprocedural laboratory examination: Secondary | ICD-10-CM

## 2014-03-17 LAB — CBC WITH DIFFERENTIAL/PLATELET
BASOS ABS: 0 10*3/uL (ref 0.0–0.1)
Basophils Relative: 0.3 % (ref 0.0–3.0)
Eosinophils Absolute: 0.5 10*3/uL (ref 0.0–0.7)
Eosinophils Relative: 6.3 % — ABNORMAL HIGH (ref 0.0–5.0)
HEMATOCRIT: 45 % (ref 39.0–52.0)
Hemoglobin: 15.1 g/dL (ref 13.0–17.0)
LYMPHS ABS: 2.3 10*3/uL (ref 0.7–4.0)
Lymphocytes Relative: 30 % (ref 12.0–46.0)
MCHC: 33.6 g/dL (ref 30.0–36.0)
MCV: 83.4 fl (ref 78.0–100.0)
MONO ABS: 0.6 10*3/uL (ref 0.1–1.0)
Monocytes Relative: 8.4 % (ref 3.0–12.0)
NEUTROS ABS: 4.1 10*3/uL (ref 1.4–7.7)
Neutrophils Relative %: 55 % (ref 43.0–77.0)
Platelets: 269 10*3/uL (ref 150.0–400.0)
RBC: 5.39 Mil/uL (ref 4.22–5.81)
RDW: 13.6 % (ref 11.5–15.5)
WBC: 7.5 10*3/uL (ref 4.0–10.5)

## 2014-03-17 LAB — BASIC METABOLIC PANEL
BUN: 17 mg/dL (ref 6–23)
CHLORIDE: 101 meq/L (ref 96–112)
CO2: 31 meq/L (ref 19–32)
Calcium: 9.1 mg/dL (ref 8.4–10.5)
Creatinine, Ser: 1.6 mg/dL — ABNORMAL HIGH (ref 0.4–1.5)
GFR: 47.56 mL/min — ABNORMAL LOW (ref >60.00)
GLUCOSE: 124 mg/dL — AB (ref 70–99)
POTASSIUM: 3.8 meq/L (ref 3.5–5.1)
Sodium: 137 mEq/L (ref 135–145)

## 2014-03-17 LAB — PROTIME-INR
INR: 1.1 ratio — ABNORMAL HIGH (ref 0.8–1.0)
Prothrombin Time: 12.2 s (ref 9.6–13.1)

## 2014-03-18 ENCOUNTER — Telehealth: Payer: Self-pay | Admitting: Internal Medicine

## 2014-03-18 NOTE — Telephone Encounter (Signed)
Walk In pt Form " FMLA/Return to Work Form" Dropped off Sent to [employer] For Completion 9.23.15/km

## 2014-03-19 ENCOUNTER — Telehealth: Payer: Self-pay | Admitting: Internal Medicine

## 2014-03-19 NOTE — Telephone Encounter (Signed)
New message         Pt has a cold / he is scheduled for a procedure on the 28th / should he r/s?

## 2014-03-19 NOTE — Telephone Encounter (Signed)
Spoke with patient who states he has procedures scheduled for 9/28 (TEE) and 9/29 (ablation) and he wants to make certain these don't get cancelled due to cold s/s.  I reviewed patient's symptoms and he reports that he has some sinus pressure and a slight cough.  I advised patient that if he develops fatigue, muscle aches, fever, worsening cough that he should call hospital to cancel procedures and if it is during office hours to notify our office.  Patient verbalized understanding and agreement.

## 2014-03-20 ENCOUNTER — Other Ambulatory Visit: Payer: Self-pay | Admitting: *Deleted

## 2014-03-20 ENCOUNTER — Telehealth: Payer: Self-pay | Admitting: *Deleted

## 2014-03-20 NOTE — Telephone Encounter (Signed)
Left message for patient to push fluids over the weekend and we will recheck his BMP on Mon 9/28 at hospital.(TEE)  Also asked him not to take any medications the morning of the procedure.

## 2014-03-20 NOTE — Telephone Encounter (Signed)
New message     Pt dropped FMLA forms off on 03-17-14 because he is having an ablation and TEE next week by Dr Johney Frame.  His job has not received the forms back.  Please fax forms and if there is a problem---please call patient.

## 2014-03-23 ENCOUNTER — Encounter (HOSPITAL_COMMUNITY)
Admission: RE | Disposition: A | Discharge status: Home / Self Care | Payer: Self-pay | Source: Ambulatory Visit | Attending: Internal Medicine

## 2014-03-23 ENCOUNTER — Encounter (HOSPITAL_COMMUNITY): Payer: Self-pay | Admitting: Gastroenterology

## 2014-03-23 ENCOUNTER — Ambulatory Visit (HOSPITAL_COMMUNITY)
Admission: RE | Admit: 2014-03-23 | Discharge: 2014-03-23 | Disposition: A | Discharge status: Home / Self Care | Payer: BC Managed Care – PPO | Source: Ambulatory Visit | Attending: Internal Medicine | Admitting: Internal Medicine

## 2014-03-23 DIAGNOSIS — E119 Type 2 diabetes mellitus without complications: Secondary | ICD-10-CM | POA: Insufficient documentation

## 2014-03-23 DIAGNOSIS — Z888 Allergy status to other drugs, medicaments and biological substances status: Secondary | ICD-10-CM | POA: Diagnosis not present

## 2014-03-23 DIAGNOSIS — Z87891 Personal history of nicotine dependence: Secondary | ICD-10-CM | POA: Insufficient documentation

## 2014-03-23 DIAGNOSIS — I480 Paroxysmal atrial fibrillation: Secondary | ICD-10-CM

## 2014-03-23 DIAGNOSIS — Z794 Long term (current) use of insulin: Secondary | ICD-10-CM | POA: Insufficient documentation

## 2014-03-23 DIAGNOSIS — N400 Enlarged prostate without lower urinary tract symptoms: Secondary | ICD-10-CM | POA: Insufficient documentation

## 2014-03-23 DIAGNOSIS — E785 Hyperlipidemia, unspecified: Secondary | ICD-10-CM | POA: Diagnosis not present

## 2014-03-23 DIAGNOSIS — N529 Male erectile dysfunction, unspecified: Secondary | ICD-10-CM | POA: Insufficient documentation

## 2014-03-23 DIAGNOSIS — I059 Rheumatic mitral valve disease, unspecified: Secondary | ICD-10-CM

## 2014-03-23 DIAGNOSIS — I4891 Unspecified atrial fibrillation: Secondary | ICD-10-CM | POA: Insufficient documentation

## 2014-03-23 DIAGNOSIS — I10 Essential (primary) hypertension: Secondary | ICD-10-CM | POA: Insufficient documentation

## 2014-03-23 HISTORY — PX: TEE WITHOUT CARDIOVERSION: SHX5443

## 2014-03-23 LAB — GLUCOSE, CAPILLARY: Glucose-Capillary: 159 mg/dL — ABNORMAL HIGH (ref 70–99)

## 2014-03-23 SURGERY — ECHOCARDIOGRAM, TRANSESOPHAGEAL
Anesthesia: Moderate Sedation

## 2014-03-23 MED ORDER — MIDAZOLAM HCL 5 MG/ML IJ SOLN
INTRAMUSCULAR | Status: AC
  Filled 2014-03-23: qty 2

## 2014-03-23 MED ORDER — DIPHENHYDRAMINE HCL 50 MG/ML IJ SOLN
INTRAMUSCULAR | Status: AC
  Filled 2014-03-23: qty 1

## 2014-03-23 MED ORDER — LIDOCAINE VISCOUS 2 % MT SOLN
OROMUCOSAL | Status: AC
  Filled 2014-03-23: qty 15

## 2014-03-23 MED ORDER — FENTANYL CITRATE 0.05 MG/ML IJ SOLN
INTRAMUSCULAR | Status: DC | PRN
  Administered 2014-03-23 (×2): 25 ug via INTRAVENOUS

## 2014-03-23 MED ORDER — FENTANYL CITRATE 0.05 MG/ML IJ SOLN
INTRAMUSCULAR | Status: AC
  Filled 2014-03-23: qty 2

## 2014-03-23 MED ORDER — LIDOCAINE VISCOUS 2 % MT SOLN
OROMUCOSAL | Status: DC | PRN
  Administered 2014-03-23: 1 via OROMUCOSAL

## 2014-03-23 MED ORDER — MIDAZOLAM HCL 10 MG/2ML IJ SOLN
INTRAMUSCULAR | Status: DC | PRN
  Administered 2014-03-23 (×3): 2 mg via INTRAVENOUS

## 2014-03-23 MED ORDER — SODIUM CHLORIDE 0.9 % IV SOLN
INTRAVENOUS | Status: DC
Start: 2014-03-23 — End: 2014-03-23
  Administered 2014-03-23: 500 mL via INTRAVENOUS

## 2014-03-23 MED ORDER — SODIUM CHLORIDE 0.9 % IV SOLN: INTRAVENOUS | Status: DC

## 2014-03-23 NOTE — Op Note (Signed)
LA, LAA without masses Mild TR Mild MR Trace AI Trace PR  LVEF normal.   No PFO as tested with injection of agitated saline

## 2014-03-23 NOTE — Progress Notes (Signed)
  Echocardiogram Echocardiogram Transesophageal has been performed.  Cathie Beams 03/23/2014, 9:44 AM

## 2014-03-23 NOTE — H&P (Signed)
Primary Physician: Primary Cardiologist:  Allred     HPI:  Patient is a 53 yo who presents for TEE prior to ablation of atrial fibrillation.           Past Medical History  Diagnosis Date  . Diabetes mellitus     Type II dx 1999  . Hyperlipidemia   . Hypertension   . Benign prostatic hypertrophy     s/p TUNA procedure aprx 2006,  . Carpal tunnel syndrome 06/17/2009  . HYPERSOMNIA UNSPECIFIED 12/11/2007  . INSOMNIA-SLEEP DISORDER-UNSPEC 04/12/2010  . Allergic rhinitis   . History of cardiovascular stress test 8/11    Negative  . ED (erectile dysfunction)     After TUNA  . Elevated LFTs 2010    (-) chronic hep  panel  . Atrial fibrillation 12/21/2009    paroxysmal. Failed Multaq    Medications Prior to Admission  Medication Sig Dispense Refill  . Blood Glucose Monitoring Suppl (ONETOUCH VERIO [facility] SYSTEM) W/DEVICE KIT 1 Device by Does not apply route once.  1 kit  0  . Canagliflozin-Metformin HCl (INVOKAMET) 50-1000 MG TABS Take 1 tablet twice a day  60 tablet  3  . fish oil-omega-3 fatty acids 1000 MG capsule Take 2 g by mouth 2 (two) times daily.        . flecainide (TAMBOCOR) 100 MG tablet TAKE ONE TABLET BY MOUTH TWICE DAILY  180 tablet  1  . fluticasone (FLONASE) 50 MCG/ACT nasal spray USE TWO SPRAY(S) IN EACH NOSTRIL ONCE DAILY  16 g  1  . furosemide (LASIX) 20 MG tablet Take 1 tablet (20 mg total) by mouth 2 (two) times daily.  180 tablet  3  . gabapentin (NEURONTIN) 600 MG tablet Take 1 tablet (600 mg total) by mouth 3 (three) times daily.  90 tablet  6  . glucagon (GLUCAGON EMERGENCY) 1 MG injection Inject 1 mg into the vein once as needed.  1 each  5  . glucose blood (ONETOUCH VERIO) test strip Use to check blood sugar twice daily.  100 each  3  . insulin aspart (NOVOLOG FLEXPEN) 100 UNIT/ML FlexPen Inject 15 Units into the skin 3 (three) times daily with meals.  15 mL  11  . Insulin Glargine (LANTUS SOLOSTAR) 100 UNIT/ML Solostar Pen Inject 60 Units into  the skin daily.  5 pen  3  . Insulin Syringe-Needle U-100 (RELION INSULIN SYR 0.5ML/31G) 31G X 5/16" 0.5 ML MISC USE ONE SYRINGE THREE TO FOUR TIMES DAILY  100 each  3  . loratadine (CLARITIN) 10 MG tablet Take 10 mg by mouth daily as needed for allergies.      . metoprolol succinate (TOPROL-XL) 25 MG 24 hr tablet TAKE 1/2 TABLET BY MOUTH EVERY DAY      . ONETOUCH DELICA LANCETS 66M MISC USE ONE UNIT TO CHECK GLUCOSE TWICE DAILY  100 each  0  . PRADAXA 150 MG CAPS capsule TAKE ONE CAPSULE BY MOUTH EVERY 12 HOURS  60 capsule  4  . simvastatin (ZOCOR) 20 MG tablet TAKE 1/2 TABLET BY MOUTH EVERY DAY  90 tablet  0  . tamsulosin (FLOMAX) 0.4 MG CAPS capsule Take 1 capsule (0.4 mg total) by mouth daily.  30 capsule  11       Infusions:   Allergies  Allergen Reactions  . Metformin     REACTION: pt states caused liver injury some years ago    History   Social History  . Marital Status: Married  Spouse Name: N/A    Number of Children: 2  . Years of Education: N/A   Occupational History  . 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 Smoking status: Former Smoker    Types: Cigarettes    Quit date: 06/26/1985  . Smokeless tobacco: Never Used  . Alcohol Use: No     Comment:    . Drug Use: No  . Sexual Activity: Not on file   Other Topics Concern  . Not on file   Social History Narrative   has a girlfriend, lives in [place]             Family History  Problem Relation Age of Onset  . Heart disease Mother     M-- MI,CHF  . Diabetes Mother   . Hypertension Mother   . Hypertension Father   . Cancer Neg Hx     Negative FH of Colon or Prostate Cancer    REVIEW OF SYSTEMS:  All systems reviewed  Negative to the above problem except as noted above.    PHYSICAL EXAM: There were no vitals filed for this visit.  No intake or output data in the 24 hours ending 03/23/14 0808  General:  Well appearing. No  respiratory difficulty HEENT: normal Neck: supple. no JVD. Carotids 2+ bilat; no bruits. No lymphadenopathy or thryomegaly appreciated. Cor: PMI nondisplaced. Regular rate & rhythm. No rubs, gallops or murmurs. Lungs: clear Abdomen: soft, nontender, nondistended. No hepatosplenomegaly. No bruits or masses. Good bowel sounds. Extremities: no cyanosis, clubbing, rash, edema Neuro: alert & oriented x 3, cranial nerves grossly intact. moves all 4 extremities w/o difficulty. Affect pleasant.    ASSESSMENT:  52 yo with atrial fibrillation.  Plan for TEE prior to RF ablation to r/o thrombus.

## 2014-03-23 NOTE — Discharge Instructions (Addendum)
°  Transesophageal Echocardiogram, Care After °Refer to this sheet in the next few weeks. These instructions provide you with information on caring for yourself after your procedure. Your caregiver may also give you more specific instructions. Your treatment has been planned according to current medical practices, but problems sometimes occur. Call your caregiver if you have any problems or questions after your procedure. °HOME CARE INSTRUCTIONS °· If you were given medicine to help you relax (sedative), do not drive, operate machinery, or sign important documents for 24 hours. °· Avoid alcohol and hot or warm beverages for the first 24 hours after the procedure. °· Only take over-the-counter or prescription medicines for pain, discomfort, or fever as directed by your caregiver. You may resume taking your normal medicines unless your caregiver tells you otherwise. Ask your caregiver when you may resume taking medicines that may cause bleeding, such as aspirin, clopidogrel, or warfarin. °· You may return to your normal diet and activities on the day after your procedure, or as directed by your caregiver. Walking may help to reduce any bloated feeling in your abdomen. °· Drink enough fluids to keep your urine clear or pale yellow. °· You may gargle with salt water if you have a sore throat. °SEEK IMMEDIATE MEDICAL CARE IF: °· You have severe nausea or vomiting. °· You have severe abdominal pain, abdominal cramps that last longer than 6 hours, or abdominal swelling (distention). °· You have severe shoulder or back pain. °· You have trouble swallowing. °· You have shortness of breath, your breathing is shallow, or you are breathing faster than normal. °· You have a fever or a rapid heartbeat. °· You vomit blood or material that looks like coffee grounds. °· You have bloody, black, or tarry stools. °MAKE SURE YOU: °· Understand these instructions. °· Will watch your condition. °· Will get help right away if you are not  doing well or get worse. °Document Released: 01/25/2004 Document Revised: 10/27/2013 Document Reviewed: 09/12/2011 °ExitCare® Patient Information ©2015 [employer]. This information is not intended to replace advice given to you by your health care provider. Make sure you discuss any questions you have with your health care provider. ° °

## 2014-03-24 ENCOUNTER — Encounter (HOSPITAL_COMMUNITY): Payer: Self-pay | Admitting: Internal Medicine

## 2014-03-24 ENCOUNTER — Encounter (HOSPITAL_COMMUNITY): Payer: BC Managed Care – PPO | Admitting: Anesthesiology

## 2014-03-24 ENCOUNTER — Ambulatory Visit (HOSPITAL_COMMUNITY)
Admission: RE | Admit: 2014-03-24 | Discharge: 2014-03-25 | Disposition: A | Discharge status: Home / Self Care | Payer: BC Managed Care – PPO | Source: Ambulatory Visit | Attending: Internal Medicine | Admitting: Internal Medicine

## 2014-03-24 ENCOUNTER — Ambulatory Visit (HOSPITAL_COMMUNITY): Payer: BC Managed Care – PPO | Admitting: Anesthesiology

## 2014-03-24 ENCOUNTER — Encounter (HOSPITAL_COMMUNITY)
Admission: RE | Disposition: A | Discharge status: Home / Self Care | Payer: BC Managed Care – PPO | Source: Ambulatory Visit | Attending: Internal Medicine

## 2014-03-24 DIAGNOSIS — I480 Paroxysmal atrial fibrillation: Secondary | ICD-10-CM | POA: Diagnosis present

## 2014-03-24 DIAGNOSIS — Z23 Encounter for immunization: Secondary | ICD-10-CM | POA: Diagnosis not present

## 2014-03-24 DIAGNOSIS — Z6832 Body mass index (BMI) 32.0-32.9, adult: Secondary | ICD-10-CM | POA: Diagnosis not present

## 2014-03-24 DIAGNOSIS — R071 Chest pain on breathing: Secondary | ICD-10-CM | POA: Insufficient documentation

## 2014-03-24 DIAGNOSIS — R0781 Pleurodynia: Secondary | ICD-10-CM

## 2014-03-24 DIAGNOSIS — Z794 Long term (current) use of insulin: Secondary | ICD-10-CM | POA: Diagnosis not present

## 2014-03-24 DIAGNOSIS — I4891 Unspecified atrial fibrillation: Secondary | ICD-10-CM | POA: Diagnosis not present

## 2014-03-24 DIAGNOSIS — Z7901 Long term (current) use of anticoagulants: Secondary | ICD-10-CM | POA: Insufficient documentation

## 2014-03-24 DIAGNOSIS — Z87891 Personal history of nicotine dependence: Secondary | ICD-10-CM | POA: Diagnosis not present

## 2014-03-24 DIAGNOSIS — E119 Type 2 diabetes mellitus without complications: Secondary | ICD-10-CM | POA: Diagnosis not present

## 2014-03-24 DIAGNOSIS — I10 Essential (primary) hypertension: Secondary | ICD-10-CM | POA: Diagnosis not present

## 2014-03-24 DIAGNOSIS — E669 Obesity, unspecified: Secondary | ICD-10-CM | POA: Diagnosis not present

## 2014-03-24 HISTORY — PX: ATRIAL FIBRILLATION ABLATION: SHX5456

## 2014-03-24 HISTORY — PX: ABLATION: SHX5711

## 2014-03-24 LAB — POCT ACTIVATED CLOTTING TIME
ACTIVATED CLOTTING TIME: 343 s
ACTIVATED CLOTTING TIME: 259 s
Activated Clotting Time: 281 seconds
Activated Clotting Time: 152 seconds

## 2014-03-24 LAB — GLUCOSE, CAPILLARY
Glucose-Capillary: 141 mg/dL — ABNORMAL HIGH (ref 70–99)
Glucose-Capillary: 134 mg/dL — ABNORMAL HIGH (ref 70–99)
Glucose-Capillary: 127 mg/dL — ABNORMAL HIGH (ref 70–99)

## 2014-03-24 LAB — BASIC METABOLIC PANEL
Anion gap: 14 (ref 5–15)
BUN: 15 mg/dL (ref 6–23)
CALCIUM: 9.5 mg/dL (ref 8.4–10.5)
CO2: 25 mEq/L (ref 19–32)
CREATININE: 0.81 mg/dL (ref 0.50–1.35)
Chloride: 100 mEq/L (ref 96–112)
GFR calc Af Amer: >90 mL/min (ref >90)
GFR calc non Af Amer: >90 mL/min (ref >90)
GLUCOSE: 147 mg/dL — AB (ref 70–99)
Potassium: 4.1 mEq/L (ref 3.7–5.3)
Sodium: 139 mEq/L (ref 137–147)

## 2014-03-24 LAB — MRSA PCR SCREENING: MRSA BY PCR: NEGATIVE

## 2014-03-24 SURGERY — ATRIAL FIBRILLATION ABLATION
Anesthesia: General

## 2014-03-24 MED ORDER — OXYCODONE HCL 5 MG/5ML PO SOLN: 5.0000 mg | Freq: Once | ORAL | Status: DC | PRN

## 2014-03-24 MED ORDER — BUPIVACAINE HCL (PF) 0.25 % IJ SOLN
INTRAMUSCULAR | Status: AC
  Filled 2014-03-24: qty 30

## 2014-03-24 MED ORDER — ONDANSETRON HCL 4 MG/2ML IJ SOLN
INTRAMUSCULAR | Status: DC | PRN
Start: 2014-03-24 — End: 2014-03-24
  Administered 2014-03-24: 4 mg via INTRAVENOUS

## 2014-03-24 MED ORDER — DOBUTAMINE-DEXTROSE 2-5 MG/ML-% IV SOLN
INTRAVENOUS | Status: DC | PRN
  Administered 2014-03-24: 10 ug/kg/min via INTRAVENOUS

## 2014-03-24 MED ORDER — HEPARIN SODIUM (PORCINE) 1000 UNIT/ML IJ SOLN
INTRAMUSCULAR | Status: DC | PRN
  Administered 2014-03-24: 2000 [IU] via INTRAVENOUS

## 2014-03-24 MED ORDER — LIDOCAINE HCL (CARDIAC) 20 MG/ML IV SOLN
INTRAVENOUS | Status: DC | PRN
  Administered 2014-03-24: 50 mg via INTRAVENOUS

## 2014-03-24 MED ORDER — TAMSULOSIN HCL 0.4 MG PO CAPS
0.4000 mg | ORAL_CAPSULE | Freq: Every day | ORAL | Status: DC
  Administered 2014-03-24 – 2014-03-25 (×2): 0.4 mg via ORAL
  Filled 2014-03-24 (×2): qty 1

## 2014-03-24 MED ORDER — ONDANSETRON HCL 4 MG/2ML IJ SOLN
4.0000 mg | Freq: Four times a day (QID) | INTRAMUSCULAR | Status: DC | PRN
  Administered 2014-03-25: 4 mg via INTRAVENOUS
  Filled 2014-03-24: qty 2

## 2014-03-24 MED ORDER — OXYCODONE HCL 5 MG PO TABS: 5.0000 mg | ORAL_TABLET | Freq: Once | ORAL | Status: DC | PRN

## 2014-03-24 MED ORDER — SODIUM CHLORIDE 0.9 % IV SOLN: 250.0000 mL | INTRAVENOUS | Status: DC | PRN

## 2014-03-24 MED ORDER — ONDANSETRON HCL 4 MG/2ML IJ SOLN: 4.0000 mg | Freq: Once | INTRAMUSCULAR | Status: DC | PRN

## 2014-03-24 MED ORDER — PNEUMOCOCCAL VAC POLYVALENT 25 MCG/0.5ML IJ INJ
0.5000 mL | INJECTION | INTRAMUSCULAR | Status: AC
  Administered 2014-03-25: 0.5 mL via INTRAMUSCULAR
  Filled 2014-03-24: qty 0.5

## 2014-03-24 MED ORDER — INSULIN ASPART 100 UNIT/ML ~~LOC~~ SOLN
18.0000 [IU] | Freq: Every day | SUBCUTANEOUS | Status: DC
  Administered 2014-03-25: 18 [IU] via SUBCUTANEOUS

## 2014-03-24 MED ORDER — HYDROCODONE-ACETAMINOPHEN 5-325 MG PO TABS
1.0000 | ORAL_TABLET | ORAL | Status: DC | PRN
  Administered 2014-03-25 (×2): 1 via ORAL
  Filled 2014-03-24 (×2): qty 1

## 2014-03-24 MED ORDER — INSULIN ASPART 100 UNIT/ML ~~LOC~~ SOLN
25.0000 [IU] | Freq: Every day | SUBCUTANEOUS | Status: DC
  Administered 2014-03-24 – 2014-03-25 (×2): 25 [IU] via SUBCUTANEOUS

## 2014-03-24 MED ORDER — PROPOFOL 10 MG/ML IV BOLUS
INTRAVENOUS | Status: DC | PRN
  Administered 2014-03-24: 190 mg via INTRAVENOUS

## 2014-03-24 MED ORDER — INSULIN ASPART 100 UNIT/ML ~~LOC~~ SOLN: 0.0000 [IU] | Freq: Three times a day (TID) | SUBCUTANEOUS | Status: DC

## 2014-03-24 MED ORDER — SODIUM CHLORIDE 0.9 % IJ SOLN
3.0000 mL | Freq: Two times a day (BID) | INTRAMUSCULAR | Status: DC
  Administered 2014-03-25: 3 mL via INTRAVENOUS

## 2014-03-24 MED ORDER — ACETAMINOPHEN 325 MG PO TABS
650.0000 mg | ORAL_TABLET | ORAL | Status: DC | PRN
  Administered 2014-03-24: 650 mg via ORAL
  Filled 2014-03-24: qty 2

## 2014-03-24 MED ORDER — FENTANYL CITRATE 0.05 MG/ML IJ SOLN
INTRAMUSCULAR | Status: DC | PRN
  Administered 2014-03-24: 50 ug via INTRAVENOUS
  Administered 2014-03-24: 25 ug via INTRAVENOUS

## 2014-03-24 MED ORDER — HEPARIN SODIUM (PORCINE) 1000 UNIT/ML IJ SOLN
INTRAMUSCULAR | Status: AC
  Filled 2014-03-24: qty 1

## 2014-03-24 MED ORDER — MIDAZOLAM HCL 5 MG/5ML IJ SOLN
INTRAMUSCULAR | Status: DC | PRN
  Administered 2014-03-24: 2 mg via INTRAVENOUS

## 2014-03-24 MED ORDER — SODIUM CHLORIDE 0.9 % IJ SOLN: 3.0000 mL | INTRAMUSCULAR | Status: DC | PRN

## 2014-03-24 MED ORDER — DOBUTAMINE IN D5W 4-5 MG/ML-% IV SOLN
INTRAVENOUS | Status: AC
  Filled 2014-03-24: qty 250

## 2014-03-24 MED ORDER — INSULIN GLARGINE 100 UNIT/ML ~~LOC~~ SOLN
56.0000 [IU] | Freq: Every day | SUBCUTANEOUS | Status: DC
  Administered 2014-03-24: 56 [IU] via SUBCUTANEOUS
  Filled 2014-03-24 (×2): qty 0.56

## 2014-03-24 MED ORDER — INSULIN ASPART 100 UNIT/ML ~~LOC~~ SOLN: 15.0000 [IU] | Freq: Three times a day (TID) | SUBCUTANEOUS | Status: DC

## 2014-03-24 MED ORDER — HYDROMORPHONE HCL 1 MG/ML IJ SOLN: 0.2500 mg | INTRAMUSCULAR | Status: DC | PRN

## 2014-03-24 MED ORDER — SODIUM CHLORIDE 0.9 % IV SOLN
INTRAVENOUS | Status: DC
  Administered 2014-03-24: 11:00:00 via INTRAVENOUS

## 2014-03-24 MED ORDER — INSULIN ASPART 100 UNIT/ML ~~LOC~~ SOLN
25.0000 [IU] | Freq: Every day | SUBCUTANEOUS | Status: DC
  Administered 2014-03-25: 25 [IU] via SUBCUTANEOUS

## 2014-03-24 MED ORDER — GABAPENTIN 600 MG PO TABS
600.0000 mg | ORAL_TABLET | Freq: Three times a day (TID) | ORAL | Status: DC
  Administered 2014-03-24 – 2014-03-25 (×4): 600 mg via ORAL
  Filled 2014-03-24 (×5): qty 1

## 2014-03-24 MED ORDER — DABIGATRAN ETEXILATE MESYLATE 150 MG PO CAPS
150.0000 mg | ORAL_CAPSULE | Freq: Two times a day (BID) | ORAL | Status: DC
  Administered 2014-03-24 – 2014-03-25 (×2): 150 mg via ORAL
  Filled 2014-03-24 (×3): qty 1

## 2014-03-24 NOTE — Anesthesia Preprocedure Evaluation (Addendum)
Anesthesia Evaluation  Patient identified by MRN, date of birth, ID band Patient awake    Reviewed: Allergy & Precautions, H&P , NPO status   Airway Mallampati: II TM Distance: >3 FB Neck ROM: Full    Dental  (+) Teeth Intact   Pulmonary former smoker,  breath sounds clear to auscultation        Cardiovascular hypertension, Pt. on home beta blockers Rhythm:Irregular Rate:Normal     Neuro/Psych    GI/Hepatic   Endo/Other  diabetes, Type 2, Insulin Dependent, Oral Hypoglycemic Agents  Renal/GU      Musculoskeletal   Abdominal (+) + obese,   Peds  Hematology   Anesthesia Other Findings   Reproductive/Obstetrics                        Anesthesia Physical Anesthesia Plan  ASA: III  Anesthesia Plan: General   Post-op Pain Management:    Induction: Intravenous  Airway Management Planned: LMA  Additional Equipment:   Intra-op Plan:   Post-operative Plan: Extubation in OR  Informed Consent: I have reviewed the patients History and Physical, chart, labs and discussed the procedure including the risks, benefits and alternatives for the proposed anesthesia with the patient or authorized representative who has indicated his/her understanding and acceptance.   Dental advisory given  Plan Discussed with: CRNA and Anesthesiologist  Anesthesia Plan Comments:         Anesthesia Quick Evaluation

## 2014-03-24 NOTE — Transfer of Care (Signed)
Immediate Anesthesia Transfer of Care Note  Patient: Danny GreenhouseFred W Starner  Procedure(s) Performed: Procedure(s): ATRIAL FIBRILLATION ABLATION (N/A)  Patient Location: Cath Lab  Anesthesia Type:General  Level of Consciousness: awake, alert  and oriented  Airway & Oxygen Therapy: Patient Spontanous Breathing and Patient connected to face mask oxygen  Post-op Assessment: Report given to PACU RN, Post -op Vital signs reviewed and stable and Patient moving all extremities  Post vital signs: Reviewed and stable  Complications: No apparent anesthesia complications

## 2014-03-24 NOTE — Op Note (Signed)
SURGEON:  Hillis RangeJames Wendy Mikles, MD  PREPROCEDURE DIAGNOSES: 1. Paroxysmal atrial fibrillation.  POSTPROCEDURE DIAGNOSES: 1. Paroxysmal  atrial fibrillation.  PROCEDURES: 1. Comprehensive electrophysiologic study. 2. Coronary sinus pacing and recording. 3. Three-dimensional mapping of atrial fibrillation with additional mapping and ablation within the right atrium 4. Ablation of atrial fibrillation with additional mapping and ablation within the right atrium 5. Intracardiac echocardiography. 6. Transseptal puncture of an intact septum. 7. Rotational Angiography with processing at an independent workstation 8. Arrhythmia induction with pacing with dobutamine infusion  INTRODUCTION:  Danny GreenhouseFred W [name] is a 53 y.o. male with a history of paroxysmal atrial fibrillation who now presents for EP study and radiofrequency ablation.  The patient reports initially being diagnosed with atrial fibrillation after presenting with symptomatic palpitations and fatgiue. The patient reports increasing frequency and duration of atrial fibrillation since that time.  The patient has failed medical therapy with Multaq and flecainide.  The patient therefore presents today for catheter ablation of atrial fibrillation.  DESCRIPTION OF PROCEDURE:  Informed written consent was obtained, and the patient was brought to the electrophysiology lab in a fasting state.  The patient was adequately sedated with intravenous medications as outlined in the anesthesia report.  The patient's left and right groins were prepped and draped in the usual sterile fashion by the EP lab staff.  Using a percutaneous Seldinger technique, two 7-French and one 11-French hemostasis sheaths were placed into the right common femoral vein.  3 Dimensional Rotational Angiography: A 5 french pigtail catheter was introduced through the right common femoral vein and advanced into the inferior venocava.  3 demential rotational angiography was then performed by power  injection of 100cc of nonionic contrast.  Reprocessing at an independent work station was then performed.   This demonstrated a moderate sized left atrium with 4 separate pulmonary veins which were also moderate in size.  There were no anomalous veins or significant abnormalities.  A 3 dimensional rendering of the left atrium was then merged using NIKECarto Merge technology onto the WellPointCarto mapping system and registered with intracardiac echo (see below).  The pigtail catheter was then removed.  Catheter Placement:  A 7-French Biosense Webster Decapolar coronary sinus catheter was introduced through the right common femoral vein and advanced into the coronary sinus for recording and pacing from this location.  A 6-French quadripolar catheter was introduced through the right common femoral vein and advanced into the right ventricle for recording and pacing.  This catheter was then pulled back to the His bundle location.    Initial Measurements: The patient presented to the electrophysiology lab in sinus rhythm.  His PR interval measured 169 msec with a QRS duration of 102 msec and a QT interval of 430 msec.     Intracardiac Echocardiography: A 10-French Biosense Webster AcuNav intracardiac echocardiography catheter was introduced through the left common femoral vein and advanced into the right atrium. Intracardiac echocardiography was performed of the left atrium, and a three-dimensional anatomical rendering of the left atrium was performed using CARTO sound technology.  The patient was noted to have a moderate sized left atrium.  The interatrial septum was prominent but not aneurysmal. There was a small PFO.  All 4 pulmonary veins were visualized and noted to have separate ostia.  The pulmonary veins were moderate in size.  The left atrial appendage was visualized and did not reveal thrombus.   There was no evidence of pulmonary vein stenosis.   Transseptal Puncture: The middle right common femoral vein  sheath was  exchanged for an 8.5 [place] SL2 transseptal sheath and transseptal access was achieved in a standard fashion using a Brockenbrough needle under biplane fluoroscopy with intracardiac echocardiography confirmation of the transseptal puncture.  Once transseptal access had been achieved, heparin was administered intravenously and intra- arterially in order to maintain an ACT of greater than 300 seconds throughout the procedure.   3D Mapping and Ablation: The His bundle catheter was removed and in its place a 3.5 mm [employer] Thermocool ablation catheter was advanced into the right atrium.  The transseptal sheath was pulled back into the IVC over a guidewire.  The ablation catheter was advanced across the transseptal hole using the wire as a guide.  The transseptal sheath was then re-advanced over the guidewire into the left atrium.  A duodecapolar Biosense Webster circular mapping catheter was introduced through the transseptal sheath and positioned over the mouth of all 4 pulmonary veins.  Three-dimensional electroanatomical mapping was performed using CARTO technology.  This demonstrated electrical activity within all four pulmonary veins at baseline. The patient underwent successful sequential electrical isolation and anatomical encircling of all four pulmonary veins using radiofrequency current with a circular mapping catheter as a guide in a WACA fashion.   The circular mapping catheter was pulled back into the right atrium and 3D mapping was performed at the junction of the superior vena cava and right atrium.  Electrical activity was observed within the SVC.  I therefore elected to perform right atrial ablation in this area.  A series of radiofrequency applications were delivered in a circular fashion around the ostium of the SVC.  Prior to each ablation lesions, pacing was performed from the distal ablation electrode to insure that diaphragmatic stimulation was not observed to  avoid phrenic nerve injury.  Diaphragmatic excursion was also observed during ablation.    Measurements Following Ablation: Following ablation, dobutamine was infused up to 10 mcg/min with no inducible atrial fibrillation, atrial tachycardia, atrial flutter, or sustained PACs. In sinus rhythm with RR interval was 954 msec, with PR 161 msec, QRS 98 msec, and Qtc 380 msec.  Following ablation the AH interval measured 63 msec with an HV interval of 44 msec. Ventricular pacing was performed, which revealed midline decremental VA conduction with a VA Wenckebach cycle length of 480 msec.  Rapid atrial pacing was performed, which revealed an AV Wenckebach cycle length of 310 msec.  Electroisolation was then again confirmed in all four pulmonary veins.  Pacing was performed along the ablation line which confirmed entrance and exit block.  The procedure was therefore considered completed.  All catheters were removed, and the sheaths were aspirated and flushed.  The patient was transferred to the recovery area for sheath removal per protocol. EBL<23ml.  A limited bedside transthoracic echocardiogram revealed no pericardial effusion.  There were no early apparent complications.  CONCLUSIONS: 1. Sinus rhythm upon presentation.   2. Rotational Angiography reveals a moderate sized left atrium with four separate pulmonary veins without evidence of pulmonary vein stenosis. 3. Successful electrical isolation and anatomical encircling of all four pulmonary veins with radiofrequency current using a WACA fashion.  Additional ablation was performed at the SVC/ RA junction. 4. No inducible arrhythmias following ablation both on and off of dobutamine  5. No early apparent complications.   Klare Criss,MD 2:23 PM 03/24/2014

## 2014-03-24 NOTE — Progress Notes (Addendum)
Rt fem venous sheaths x3 removed from rt groin. Manually pressure held for .  Tegaderm applied and pt instructed. Rt dr/pt 2+ intact. No problems or complications. Rt groin unremarkable, CDI. Bed rest begins at 1530.

## 2014-03-24 NOTE — H&P (Signed)
Primary Care Physician: [name], MD  Referring Physician: Dr Rolene Course is a 53 y.o. male with a h/o paroxysmal atrial fibrillation who presents today for afib ablation. He reports initially being diagnosed with atrial fibrillation in 2011 after presenting with palpitations and fatigue.  He has failed medical therapy with multaq and flecainide.  He reports bradycardia.  Today, he denies symptoms of , shortness of breath, orthopnea, PND, dizziness, presyncope, syncope, or neurologic sequela.     Past Medical History   Diagnosis  Date   .  Diabetes mellitus      Type II dx 1999   .  Hyperlipidemia    .  Hypertension    .  Benign prostatic hypertrophy      s/p TUNA procedure aprx 2006,   .  Carpal tunnel syndrome  06/17/2009   .  HYPERSOMNIA UNSPECIFIED  12/11/2007   .  INSOMNIA-SLEEP DISORDER-UNSPEC  04/12/2010   .  Allergic rhinitis    .  History of cardiovascular stress test  8/11     Negative   .  ED (erectile dysfunction)      After TUNA   .  Elevated LFTs  2010     (-) chronic hep panel   .  Atrial fibrillation  12/21/2009     paroxysmal. Failed Multaq    Past Surgical History   Procedure  Laterality  Date   .  Vasectomy       several years ago   .  Prostate surgery       TUNA   .  Cystoscopy   11-09     neg @ urology, was rx Flomax    Current Outpatient Prescriptions   Medication  Sig  Dispense  Refill   .  Blood Glucose Monitoring Suppl (ONETOUCH VERIO [facility] SYSTEM) W/DEVICE KIT  1 Device by Does not apply route once.  1 kit  0   .  Canagliflozin-Metformin HCl (INVOKAMET) 50-1000 MG TABS  Take 1 tablet twice a day  60 tablet  3   .  fish oil-omega-3 fatty acids 1000 MG capsule  Take 2 g by mouth 2 (two) times daily.     .  flecainide (TAMBOCOR) 100 MG tablet  TAKE ONE TABLET BY MOUTH TWICE DAILY  180 tablet  1   .  fluticasone (FLONASE) 50 MCG/ACT nasal spray  USE TWO SPRAY(S) IN EACH NOSTRIL ONCE DAILY  16 g  1   .  furosemide (LASIX) 20 MG tablet   Take 1 tablet (20 mg total) by mouth 2 (two) times daily.  180 tablet  3   .  gabapentin (NEURONTIN) 600 MG tablet  Take 1 tablet (600 mg total) by mouth 3 (three) times daily.  90 tablet  6   .  glucose blood (ONETOUCH VERIO) test strip  Use to check blood sugar twice daily.  100 each  3   .  HYDROcodone-acetaminophen (NORCO/VICODIN) 5-325 MG per tablet  Take 1-2 tablets by mouth every 8 (eight) hours as needed.  30 tablet  0   .  insulin aspart (NOVOLOG FLEXPEN) 100 UNIT/ML FlexPen  Inject 15 Units into the skin 3 (three) times daily with meals.  15 mL  11   .  Insulin Glargine (LANTUS SOLOSTAR) 100 UNIT/ML Solostar Pen  Inject 60 Units into the skin daily.  5 pen  3   .  Insulin Syringe-Needle U-100 (RELION INSULIN SYR 0.5ML/31G) 31G X 5/16" 0.5 ML MISC  USE  ONE SYRINGE THREE TO FOUR TIMES DAILY  100 each  3   .  loratadine (CLARITIN) 10 MG tablet  Take 10 mg by mouth daily as needed for allergies.     .  metoprolol succinate (TOPROL-XL) 25 MG 24 hr tablet  TAKE 1 TABLET BY MOUTH EVERY DAY  30 tablet  6   .  ONETOUCH DELICA LANCETS 78G MISC  USE ONE UNIT TO CHECK GLUCOSE TWICE DAILY  100 each  11   .  PRADAXA 150 MG CAPS capsule  TAKE ONE CAPSULE BY MOUTH EVERY 12 HOURS  60 capsule  4   .  simvastatin (ZOCOR) 20 MG tablet  TAKE 1/2 TABLET BY MOUTH EVERY DAY  90 tablet  0   .  tamsulosin (FLOMAX) 0.4 MG CAPS capsule  Take 1 capsule (0.4 mg total) by mouth daily.  30 capsule  11    No current facility-administered medications for this visit.    Allergies   Allergen  Reactions   .  Metformin      REACTION: pt states caused liver injury some years ago    History    Social History   .  Marital Status:  Married     Spouse Name:  N/A     Number of Children:  2   .  Years of Education:  N/A    Occupational History   .  Works for [employer] (state[employer] Drives a Actuary      works for [employer] [place]state)    Social History Main Topics   .  Smoking status:  Former Smoker     Types:  Cigarettes      Quit date:  06/26/1985   .  Smokeless tobacco:  Never Used   .  Alcohol Use:  No      Comment:   .  Drug Use:  No   .  Sexual Activity:  Not on file    Other Topics  Concern   .  Not on file    Social History Narrative    has a girlfriend, lives in [place]             Family History   Problem  Relation  Age of Onset   .  Heart disease  Mother      M-- MI,CHF   .  Diabetes  Mother    .  Hypertension  Mother    .  Hypertension  Father    .  Cancer  Neg Hx      Negative FH of Colon or Prostate Cancer   ROS- All systems are reviewed and negative except as per the HPI above  Physical Exam:  Filed Vitals:   03/24/14 1000  BP: 146/70  Pulse: 54  Temp: 97.7 F (36.5 C)  Resp: 18    GEN- The patient is well appearing, alert and oriented x 3 today.  Head- normocephalic, atraumatic  Eyes- Sclera clear, conjunctiva pink  Ears- hearing intact  Oropharynx- clear  Neck- supple, no JVP  Lymph- no cervical lymphadenopathy  Lungs- Clear to ausculation bilaterally, normal work of breathing  Heart- Regular rate and rhythm, no murmurs, rubs or gallops, PMI not laterally displaced  GI- soft, NT, ND, + BS  Extremities- no clubbing, cyanosis, or edema. Left ankle in a soft brace  MS- no significant deformity or atrophy  Skin- no rash or lesion  Psych- euthymic mood, full affect  Neuro- strength and sensation are intact    Assessment  and Plan:  1. Paroxysmal atrial fibrillation  The patient has very symptomatic paroxysmal atrial fibrillation. He has failed medical therapy with multaq and flecainide.  He is appropriately anticoagulated with pradaxa. He has sinus bradycardia. Therapeutic strategies for afib including medicine and ablation were discussed in detail with the patient today. Risk, benefits, and alternatives to EP study and radiofrequency ablation for afib were also discussed in detail today. I think that he would be a good candidate for ablation. These risks include but are  not limited to stroke, bleeding, vascular damage, tamponade, perforation, damage to the esophagus, lungs, and other structures, pulmonary vein stenosis, worsening renal function, and death. The patient understands these risk and wishes to pursue procedure.  TEE is reviewed.

## 2014-03-24 NOTE — Anesthesia Postprocedure Evaluation (Signed)
  Anesthesia Post-op Note  Patient: [name]  Procedure(s) Performed: Procedure(s): ATRIAL FIBRILLATION ABLATION (N/A)  Patient Location: PACU  Anesthesia Type:General  Level of Consciousness: awake, alert  and oriented  Airway and Oxygen Therapy: Patient Spontanous Breathing and Patient connected to nasal cannula oxygen  Post-op Pain: mild  Post-op Assessment: Post-op Vital signs reviewed, Patient's Cardiovascular Status Stable, Respiratory Function Stable, Patent Airway, No signs of Nausea or vomiting and Pain level controlled  Post-op Vital Signs: Reviewed and stable  Last Vitals:  Filed Vitals:   03/24/14 1445  BP: 143/91  Pulse: 68  Temp:   Resp: 12    Complications: No apparent anesthesia complications

## 2014-03-24 NOTE — Anesthesia Procedure Notes (Signed)
Procedure Name: LMA Insertion Date/Time: 03/24/2014 11:38 AM Performed by: Sharlene DoryWALKER, Britny Riel E Pre-anesthesia Checklist: Patient identified, Emergency Drugs available, Suction available, Patient being monitored and Timeout performed Patient Re-evaluated:Patient Re-evaluated prior to inductionOxygen Delivery Method: Circle system utilized Preoxygenation: Pre-oxygenation with 100% oxygen Intubation Type: IV induction LMA: LMA inserted LMA Size: 4.0 Number of attempts: 1 Placement Confirmation: positive ETCO2 and breath sounds checked- equal and bilateral Tube secured with: Tape Dental Injury: Teeth and Oropharynx as per pre-operative assessment

## 2014-03-24 NOTE — Progress Notes (Signed)
Called to pt room by family at bedside. Pt coughed and groin started rebleeding. Dressing removed and manual pressure held x . Slight ooze noted at site. Pressure dressing applied to Rt groin, level 0. Instructions reintegrated. VSS, pedial pulses 2+. Will continue to monitor closely.

## 2014-03-25 ENCOUNTER — Ambulatory Visit (HOSPITAL_COMMUNITY): Payer: BC Managed Care – PPO

## 2014-03-25 DIAGNOSIS — I4891 Unspecified atrial fibrillation: Secondary | ICD-10-CM

## 2014-03-25 DIAGNOSIS — R072 Precordial pain: Secondary | ICD-10-CM

## 2014-03-25 DIAGNOSIS — E119 Type 2 diabetes mellitus without complications: Secondary | ICD-10-CM

## 2014-03-25 DIAGNOSIS — R071 Chest pain on breathing: Secondary | ICD-10-CM

## 2014-03-25 DIAGNOSIS — I10 Essential (primary) hypertension: Secondary | ICD-10-CM | POA: Diagnosis not present

## 2014-03-25 DIAGNOSIS — Z23 Encounter for immunization: Secondary | ICD-10-CM | POA: Diagnosis not present

## 2014-03-25 LAB — BASIC METABOLIC PANEL
Anion gap: 12 (ref 5–15)
BUN: 12 mg/dL (ref 6–23)
CHLORIDE: 100 meq/L (ref 96–112)
CO2: 25 meq/L (ref 19–32)
Calcium: 8.5 mg/dL (ref 8.4–10.5)
Creatinine, Ser: 0.8 mg/dL (ref 0.50–1.35)
GFR calc Af Amer: >90 mL/min (ref >90)
GFR calc non Af Amer: >90 mL/min (ref >90)
GLUCOSE: 169 mg/dL — AB (ref 70–99)
POTASSIUM: 4.1 meq/L (ref 3.7–5.3)
Sodium: 137 mEq/L (ref 137–147)

## 2014-03-25 LAB — GLUCOSE, CAPILLARY
GLUCOSE-CAPILLARY: 132 mg/dL — AB (ref 70–99)
GLUCOSE-CAPILLARY: 143 mg/dL — AB (ref 70–99)
Glucose-Capillary: 159 mg/dL — ABNORMAL HIGH (ref 70–99)
Glucose-Capillary: 171 mg/dL — ABNORMAL HIGH (ref 70–99)

## 2014-03-25 MED ORDER — FENTANYL CITRATE 0.05 MG/ML IJ SOLN
INTRAMUSCULAR | Status: AC
  Filled 2014-03-25: qty 2

## 2014-03-25 MED ORDER — PANTOPRAZOLE SODIUM 40 MG PO TBEC: 40.0000 mg | DELAYED_RELEASE_TABLET | Freq: Every day | ORAL | Status: DC

## 2014-03-25 MED ORDER — COLCHICINE 0.6 MG PO TABS
0.6000 mg | ORAL_TABLET | Freq: Every day | ORAL | Status: DC
Start: 2014-03-25 — End: 2014-06-05

## 2014-03-25 MED ORDER — COLCHICINE 0.6 MG PO TABS
0.6000 mg | ORAL_TABLET | Freq: Two times a day (BID) | ORAL | Status: DC
  Administered 2014-03-25: 0.6 mg via ORAL
  Filled 2014-03-25 (×2): qty 1

## 2014-03-25 MED ORDER — PANTOPRAZOLE SODIUM 40 MG PO TBEC
40.0000 mg | DELAYED_RELEASE_TABLET | Freq: Every day | ORAL | Status: DC
  Administered 2014-03-25: 40 mg via ORAL
  Filled 2014-03-25: qty 1

## 2014-03-25 MED ORDER — FENTANYL CITRATE 0.05 MG/ML IJ SOLN: 25.0000 ug | Freq: Once | INTRAMUSCULAR | Status: DC

## 2014-03-25 MED ORDER — FENTANYL CITRATE 0.05 MG/ML IJ SOLN
25.0000 ug | Freq: Once | INTRAMUSCULAR | Status: AC
  Administered 2014-03-25: 25 ug via INTRAVENOUS

## 2014-03-25 MED ORDER — MORPHINE SULFATE 2 MG/ML IJ SOLN
INTRAMUSCULAR | Status: AC
  Administered 2014-03-25: 2 mg via INTRAVENOUS
  Filled 2014-03-25: qty 1

## 2014-03-25 MED ORDER — MORPHINE SULFATE 2 MG/ML IJ SOLN: 1.0000 mg | INTRAMUSCULAR | Status: DC | PRN

## 2014-03-25 MED ORDER — KETOROLAC TROMETHAMINE 30 MG/ML IJ SOLN
30.0000 mg | INTRAMUSCULAR | Status: AC
  Administered 2014-03-25: 30 mg via INTRAVENOUS
  Filled 2014-03-25: qty 1

## 2014-03-25 MED ORDER — ALUM & MAG HYDROXIDE-SIMETH 200-200-20 MG/5ML PO SUSP
30.0000 mL | Freq: Four times a day (QID) | ORAL | Status: DC | PRN
  Administered 2014-03-25: 30 mL via ORAL
  Filled 2014-03-25: qty 30

## 2014-03-25 NOTE — Progress Notes (Signed)
   Called by the nurse to see this patient with pain.  The pain occurs with deep breathing and is from his throat down to his mid sternum.  It is 5/10 at peak. Sharp.    BP 131/66  Pulse 54  Temp(Src) 97.5 F (36.4 C) (Oral)  Resp 23  Ht 5\' 11"  (1.803 m)  Wt 232 lb 12.9 oz (105.6 kg)  BMI 32.48 kg/m2  SpO2 98%  Lungs:  Clear COR:  No rub, regular  A/P  Chest pain.  Pleuritic in nature.  Bedside echo with poor acoustic windows but no obvious effusion.  EKG pending.  I will order an echo for first thing in the AM.  Check CKMB.  Morphine for pain control.

## 2014-03-25 NOTE — Discharge Instructions (Signed)
No driving for 5 days. No lifting over 5 lbs for 1 week. No sexual activity for 1 week. You may return to work in 7 days. Keep procedure site clean & dry. If you notice increased pain, swelling, bleeding or pus, call/return!  You may shower, but no soaking baths/hot tubs/pools for 1 week.   If chest pain or SOB worsens or you develop fevers or new concerns, please call [facility] cardiologist on call.

## 2014-03-25 NOTE — Progress Notes (Signed)
    SUBJECTIVE: S/p PVI 03-24-14  With persistent midsternal chest pain since around 3AM.  Worse with inspiration.  Bedside echo this am demonstrates no effusion.  Morphine relieved pain.  No shortness of breath, blood pressure stable.   CURRENT MEDICATIONS: . dabigatran  150 mg Oral BID  . fentaNYL      . gabapentin  600 mg Oral TID  . insulin aspart  18 Units Subcutaneous Q breakfast  . insulin aspart  25 Units Subcutaneous Q lunch  . insulin aspart  25 Units Subcutaneous Q supper  . insulin glargine  56 Units Subcutaneous QHS  . pneumococcal 23 valent vaccine  0.5 mL Intramuscular Tomorrow-1000  . sodium chloride  3 mL Intravenous Q12H  . tamsulosin  0.4 mg Oral QPC supper      OBJECTIVE: Physical Exam: Filed Vitals:   03/24/14 2336 03/25/14 0000 03/25/14 0303 03/25/14 0404  BP: 114/60 116/68 131/66 133/72  Pulse: 54     Temp: 97.5 F (36.4 C)  97.8 F (36.6 C)   TempSrc: Oral  Oral   Resp: 14 13 23 8   Height:      Weight:      SpO2: 95% 99% 98% 98%    Intake/Output Summary (Last 24 hours) at 03/25/14 [id number] Last data filed at 03/25/14 0300  Gross per 24 hour  Intake 2324.17 ml  Output   2550 ml  Net -225.83 ml    Telemetry reveals sinus rhythm  GEN- The patient is well appearing, alert and oriented x 3 today.   Head- normocephalic, atraumatic Eyes-  Sclera clear, conjunctiva pink Ears- hearing intact Oropharynx- clear Neck- supple, no JVP Lungs- Clear to ausculation bilaterally, normal work of breathing Heart- Regular rate and rhythm, no murmurs, rubs or gallops, PMI not laterally displaced GI- soft, NT, ND, + BS Extremities- no clubbing, cyanosis, or edema, no hematoma/ bruit Skin- no rash or lesion Psych- euthymic mood, full affect Neuro- strength and sensation are intact  CXR reveals no acute airspace disease of effusions, mediastinal size is relatively similar to prior and not acutely changed Limited bedside echo by Dr Antoine PocheHochrein last night and me this  am reveal no effusion, formal echo is pending EKG reveals sinus rhythm, there is subtle PR depression and PR elevation in VR, no ischemic changes  LABS: Basic Metabolic Panel:  Recent Labs  96/09/5407/29/15 1008 03/25/14 0355  NA 139 137  K 4.1 4.1  CL 100 100  CO2 25 25  GLUCOSE 147* 169*  BUN 15 12  CREATININE 0.81 0.80  CALCIUM 9.5 8.5    ASSESSMENT AND PLAN:  Active Problems:   Atrial fibrillation  1. afib S/p ablation Off AAD Continue pradaxa 150mg  BID  2. pleuritic chest pain Likely early inflammation due to ablation He is not hypotensive/ tachycardic or febrile.  No ischemic ekg changes.  He is fully anticoagulated and therefore my suspicion for PTE is low.  CXR and limited echo are low risk.  Will give a single dose of toradol this am. Add colchicline BID (can be continued at discharge) Add protonix 40mg  daily  If he clinically improves with above, will discharge later today.  If he continues to have severe pain, will observe another day.  IF he goes home today, he will need to be seen by the AF NP on either Friday or Monday in clinic.

## 2014-03-25 NOTE — Progress Notes (Signed)
Pt remains hemodynamically stable.  Pleuritic chest pain is much improved.  He is ambulatory.  Will discharge with close outpatient follow-up.

## 2014-03-25 NOTE — Progress Notes (Signed)
D/c instructions reviewed with pt and wife. Copy of instructions given to pt. Pt waiting for wheelchair and staff to take him out for discharge. Wife going to get car.

## 2014-03-25 NOTE — Progress Notes (Signed)
  Echocardiogram 2D Echocardiogram has been performed.  Georgian CoWILLIAMS, Taden Witter 03/25/2014, 8:28 AM

## 2014-03-25 NOTE — Progress Notes (Signed)
Patient c/o of burning pain starting in mid chest radiating toward throat. Maalox 30 ml and hydrocodone 5 mg given. Back in to check on patient, and patient states pain not improving. Patient c/o of sharp pain when taking in a deep breath. Hydrocodone 5 mg PO given, O2 @ 2L [place] applied, and Dr. Kirtland BouchardHocherin paged and stated will come see patient. Wife at bedside. Vital signs stable at this time. Will continue to monitor.

## 2014-03-25 NOTE — Discharge Summary (Signed)
Physician Discharge Summary      Patient ID: Danny [name] MRN: [id number] DOB/AGE: 09/15/1960 53 y.o.  Admit date: 03/24/2014 Discharge date: 03/25/2014  Primary Discharge Diagnosis  Atrial fibrillation Secondary Discharge Diagnosis  Pleuritic chest pain post ablation, diabetes, hypertension  Significant Diagnostic Studies:  EP study and ablation for atrial fibrillation, echocardiogram  Hospital Course: The patient was admitted for elected atrial fibrillation ablation.  He underwent pulmonary veinous isolation with additional ablation performed in the right atrium on 03/24/14.  His procedure was without complication.  He was observed overnight to have pleuritic chest pain.  His pain was evaluated with cxr and an echocardiogram which were both lost risk.  EKG did not reveal ischemic changes and he was felt to have post ablation pericarditis.  He was placed on colchicine.  He received a single dose of Toradol with improvement in his symptoms.  At time of discharge the patient was alert and ambulatory.  His chest pain was much improved.  He remained afebrile and hemodynamically stable throughout his hospital stay.   Discharge Exam: Physical Exam: Filed Vitals:   03/25/14 0404 03/25/14 0810 03/25/14 1147 03/25/14 1553  BP: 133/72 121/72 120/56 145/65  Pulse:      Temp:  98.3 F (36.8 C) 97.8 F (36.6 C) 98.1 F (36.7 C)  TempSrc:  Oral Oral Oral  Resp: 8 17 17 24   Height:      Weight:      SpO2: 98% 98% 97% 100%    GEN- The patient is well appearing, alert and oriented x 3 today.   Head- normocephalic, atraumatic Eyes-  Sclera clear, conjunctiva pink Ears- hearing intact Oropharynx- clear Neck- supple, Lungs- Clear to ausculation bilaterally, normal work of breathing Heart- Regular rate and rhythm, no murmurs, rubs or gallops, PMI not laterally displaced GI- soft, NT, ND, + BS Extremities- no clubbing, cyanosis, or edema, groin is without hematoma/ bruit MS- no significant  deformity or atrophy Skin- no rash or lesion Psych- euthymic mood, full affect Neuro- strength and sensation are intact   Labs:   Lab Results  Component Value Date   WBC 7.5 03/17/2014   HGB 15.1 03/17/2014   HCT 45.0 03/17/2014   MCV 83.4 03/17/2014   PLT 269.0 03/17/2014    Recent Labs Lab 03/25/14 0355  NA 137  K 4.1  CL 100  CO2 25  BUN 12  CREATININE 0.80  CALCIUM 8.5  GLUCOSE 169*   Lab Results  Component Value Date   CKTOTAL 147 08/09/2006    Lab Results  Component Value Date   CHOL 120 03/31/2013   CHOL 120 03/29/2012   CHOL 115 10/03/2010   Lab Results  Component Value Date   HDL 32.30* 03/31/2013   HDL 30.00* 03/29/2012   HDL 29.20* 10/03/2010   Lab Results  Component Value Date   LDLCALC 67 03/31/2013   LDLCALC 67 03/29/2012   LDLCALC 60 10/03/2010   Lab Results  Component Value Date   TRIG 103.0 03/31/2013   TRIG 114.0 03/29/2012   TRIG 131.0 10/03/2010   Lab Results  Component Value Date   CHOLHDL 4 03/31/2013   CHOLHDL 4 03/29/2012   CHOLHDL 4 10/03/2010   No results found for this basename: LDLDIRECT       FOLLOW UP PLANS AND APPOINTMENTS    Medication List    STOP taking these medications       flecainide 100 MG tablet  Commonly known as:  TAMBOCOR  TAKE these medications       colchicine 0.6 MG tablet  Commonly known as:  COLCRYS  Take 1 tablet (0.6 mg total) by mouth daily.     dabigatran 150 MG Caps capsule  Commonly known as:  PRADAXA  Take 150 mg by mouth 2 (two) times daily.     fish oil-omega-3 fatty acids 1000 MG capsule  Take 2 g by mouth 2 (two) times daily.     fluticasone 50 MCG/ACT nasal spray  Commonly known as:  FLONASE  Place 2 sprays into both nostrils daily.     furosemide 20 MG tablet  Commonly known as:  LASIX  Take 20 mg by mouth daily.     gabapentin 600 MG tablet  Commonly known as:  NEURONTIN  Take 1 tablet (600 mg total) by mouth 3 (three) times daily.     glucagon 1 MG injection  Commonly known  as:  GLUCAGON EMERGENCY  Inject 1 mg into the vein once as needed.     insulin aspart 100 UNIT/ML injection  Commonly known as:  novoLOG  Inject 18-25 Units into the skin 3 (three) times daily before meals. Takes 18 units in am, 25 units at lunch, 25 units in pm     insulin glargine 100 UNIT/ML injection  Commonly known as:  LANTUS  Inject 56 Units into the skin at bedtime.     INVOKAMET 50-1000 MG Tabs  Generic drug:  Canagliflozin-Metformin HCl  Take 1 tablet by mouth 2 (two) times daily.     loratadine 10 MG tablet  Commonly known as:  CLARITIN  Take 10 mg by mouth daily as needed for allergies.     metoprolol succinate 25 MG 24 hr tablet  Commonly known as:  TOPROL-XL  Take 12.5 mg by mouth daily.     pantoprazole 40 MG tablet  Commonly known as:  PROTONIX  Take 1 tablet (40 mg total) by mouth daily.     simvastatin 20 MG tablet  Commonly known as:  ZOCOR  Take 10 mg by mouth at bedtime.     tamsulosin 0.4 MG Caps capsule  Commonly known as:  FLOMAX  Take 1 capsule (0.4 mg total) by mouth daily.           Follow-up Information   Follow up with Hillis Range, MD On 03/30/2014. (my office will call with the appointment)    Specialty:  Cardiology   Contact information:   [address] [place] [address] [phone]       BRING ALL MEDICATIONS WITH YOU TO FOLLOW UP APPOINTMENTS  Time spent with patient to include physician time: 30 ninutes Signed: Hillis Range, MD 03/25/2014, 6:12 PM

## 2014-03-25 NOTE — Progress Notes (Signed)
Pt ambulated in room several times with wife. Tolerated well. Pt taken on second walk in hallway with RN, walked further and tolerated very well. No c/o pain or trembling feeling this time. Pt states "feels good" and ready to go home. Dr Johney FrameAllred informed of pt status, plan to send home.

## 2014-03-25 NOTE — Progress Notes (Signed)
Ambulated pt at 1545, pt walked approx 1/3 of unit, almost back to room and pt c/o feeling like "he was trembling inside" and points to chest area, pt states " feels like it's trembling or shaking inside", pt would close his eyes while this feeling was going on. Pt allowed to stop and rest when he felt this way, and would instruct him to open his eyes so he could see where he was walking to.  Pt denied any dizziness, denied chest pain increase or change, denied the feeling being like his afib or a flutter feeling. Pt stated he knows what his afib had felt like and denied that feeling, also denies feeling like his blood sugar is low.   Pt back to room, VSS.  Contacted Dr Johney FrameAllred with above info, wants pt to sit up in chair and ambulate again about 1645. Wife at bedside at this time.

## 2014-03-28 ENCOUNTER — Encounter (HOSPITAL_COMMUNITY): Payer: Self-pay | Admitting: *Deleted

## 2014-03-30 ENCOUNTER — Encounter: Payer: Self-pay | Admitting: Internal Medicine

## 2014-03-30 ENCOUNTER — Encounter: Payer: Self-pay | Admitting: *Deleted

## 2014-03-30 ENCOUNTER — Encounter: Payer: BC Managed Care – PPO | Admitting: Internal Medicine

## 2014-03-30 ENCOUNTER — Ambulatory Visit (INDEPENDENT_AMBULATORY_CARE_PROVIDER_SITE_OTHER): Payer: BC Managed Care – PPO | Admitting: Internal Medicine

## 2014-03-30 VITALS — BP 130/78 | HR 66 | Ht 71.0 in | Wt 237.1 lb

## 2014-03-30 DIAGNOSIS — I4819 Other persistent atrial fibrillation: Secondary | ICD-10-CM

## 2014-03-30 DIAGNOSIS — I481 Persistent atrial fibrillation: Secondary | ICD-10-CM

## 2014-03-30 DIAGNOSIS — I10 Essential (primary) hypertension: Secondary | ICD-10-CM

## 2014-03-30 NOTE — Progress Notes (Addendum)
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 His procedure went without complication but developed pleuritic chest pain and cxr/echo post procedure were low risk. 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  Has some mild indigestion. Rt groin sore but only mildly bruised and appears within normal limits post procedure. Noticed intermittent afib on Saturday but none for the last two days. Feels like maybe simvastatin may be contributing to palps. Flecainide stopped after procedure.  Today, he denies symptoms of , shortness of breath, orthopnea, PND,  dizziness, presyncope, syncope, or neurologic sequela.  He suffered a recent avulsion fracture of his left ankle and is wearing an ankle brace.Marland Kitchen He is currently on light duty for work, but this has significantly improved that he anticipates will return to work without restriction soon.  Past Medical History  Diagnosis Date  . Diabetes mellitus     Type II dx 1999  . Hyperlipidemia   . Hypertension   . Benign prostatic hypertrophy     s/p TUNA procedure aprx 2006,  . Carpal tunnel syndrome 06/17/2009  . HYPERSOMNIA UNSPECIFIED 12/11/2007  . INSOMNIA-SLEEP DISORDER-UNSPEC 04/12/2010  . Allergic rhinitis   . History of cardiovascular stress test 8/11    Negative  . ED (erectile dysfunction)     After TUNA  . Elevated LFTs 2010    (-) chronic hep  panel  . Atrial fibrillation 12/21/2009    paroxysmal. Failed Multaq   Past Surgical History  Procedure Laterality Date  . Vasectomy      several years ago  . Prostate surgery      TUNA  . Cystoscopy  11-09    neg  @ urology, was rx Flomax  . Tee without cardioversion N/A 03/23/2014    Procedure: TRANSESOPHAGEAL ECHOCARDIOGRAM (TEE);  Surgeon: Pricilla Riffle, MD;   Location: [facility] ENDOSCOPY;  Service: Cardiovascular;  Laterality: N/A;  . Ablation  03-24-14    PVI by Dr Johney Frame    Current Outpatient Prescriptions  Medication Sig Dispense Refill  . Canagliflozin-Metformin HCl (INVOKAMET) 50-1000 MG TABS Take 1 tablet by mouth 2 (two) times daily.      . colchicine (COLCRYS) 0.6 MG tablet Take 1 tablet (0.6 mg total) by mouth daily.  30 tablet  0  . dabigatran (PRADAXA) 150 MG CAPS capsule Take 150 mg by mouth 2 (two) times daily.      . fish oil-omega-3 fatty acids 1000 MG capsule Take 2 g by mouth 2 (two) times daily.        . fluticasone (FLONASE) 50 MCG/ACT nasal spray Place 2 sprays into both nostrils daily.      . furosemide (LASIX) 20 MG tablet Take 20 mg by mouth daily.      Marland Kitchen gabapentin (NEURONTIN) 600 MG tablet Take 1 tablet (600 mg total) by mouth 3 (three) times daily.  90 tablet  6  . glucagon (GLUCAGON EMERGENCY) 1 MG injection Inject 1 mg into the vein once as needed.  1 each  5  . insulin aspart (NOVOLOG) 100 UNIT/ML injection Inject 18-25 Units into the skin 3 (three) times daily before meals. Takes 18 units in am, 25 units at lunch, 25 units in pm      . insulin glargine (LANTUS) 100 UNIT/ML  injection Inject 56 Units into the skin at bedtime.      Marland Kitchen. loratadine (CLARITIN) 10 MG tablet Take 10 mg by mouth daily as needed for allergies.      . metoprolol succinate (TOPROL-XL) 25 MG 24 hr tablet Take 12.5 mg by mouth daily.      . pantoprazole (PROTONIX) 40 MG tablet Take 1 tablet (40 mg total) by mouth daily.  60 tablet  0  . simvastatin (ZOCOR) 20 MG tablet Take 10 mg by mouth at bedtime.       . tamsulosin (FLOMAX) 0.4 MG CAPS capsule Take 1 capsule (0.4 mg total) by mouth daily.  30 capsule  11   No current facility-administered medications for this visit.    Allergies  Allergen Reactions  . Metformin Other (See Comments)    REACTION: pt states caused liver injury some years ago, currently taking invokamet    History   Social  History  . Marital Status: Married    Spouse Name: N/A    Number of Children: 2  . Years of Education: N/A   Occupational History  . Works for [employer] (state[employer]  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 Smoking status: Former Smoker    Types: Cigarettes    Quit date: 06/26/1985  . Smokeless tobacco: Never Used  . Alcohol Use: No     Comment:    . Drug Use: No  . Sexual Activity: Not on file   Other Topics Concern  . Not on file   Social History Narrative   has a girlfriend, [place]             Family History  Problem Relation Age of Onset  . Heart disease Mother     M-- MI,CHF  . Diabetes Mother   . Hypertension Mother   . Hypertension Father   . Cancer Neg Hx     Negative FH of Colon or Prostate Cancer    ROS- All systems are reviewed and negative except as per the HPI above  Physical Exam: Filed Vitals:   03/30/14 1011  BP: 130/78  Pulse: 66  Height: 5\' 11"  (1.803 m)  Weight: 237 lb 1.9 oz (107.557 kg)    GEN- The patient is well appearing, alert and oriented x 3 today.   Head- normocephalic, atraumatic Eyes-  Sclera clear, conjunctiva pink Ears- hearing intact Oropharynx- clear Neck- supple, no JVP Lymph- no cervical lymphadenopathy Lungs- Clear to ausculation bilaterally, normal work of breathing Heart- Regular rate and rhythm, no murmurs, rubs or gallops, PMI not laterally displaced GI- soft, NT, ND, + BS Extremities- no clubbing, cyanosis, or edema.Rt groin mildly bruised. No bruit.. Pulses intact. Left ankle in a soft brace MS- no significant deformity or atrophy Skin- no rash or lesion Psych- euthymic mood, full affect Neuro- strength and sensation are intact  EKG today reveals sinus rhythm  at 66 bpm.. Recent hospital notes reviewed.  Assessment and Plan:  1. Paroxysmal atrial fibrillation/post procedure pleuritic CP  S/P PVI 03/25/14 Continue colchicine and protonix until RX is  finished. Can use flecainide prn if he has episodes of afib.. Call the office if it  becomes persistent for 2-3 days. No interruption  of blood thinner x 3 months. Feels like simvistatin maybe contributing to afib. Hold for 3 weeks then resume.   2. HTN Stable.   3. Rt groin soreness/brusing Appears to be WNL post procedure.  Call office if does not improve.   4. F/u in 3 months

## 2014-03-30 NOTE — Patient Instructions (Signed)
Your physician recommends that you schedule a follow-up appointment in: 3 months with Dr Johney FrameAllred   May return to work on 04/01/14 no restrictions

## 2014-03-31 ENCOUNTER — Telehealth: Payer: Self-pay | Admitting: Internal Medicine

## 2014-03-31 NOTE — Telephone Encounter (Signed)
Continue to monitor Danny [name]  

## 2014-03-31 NOTE — Telephone Encounter (Signed)
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 This lasted for 45 minutes.  His arms were sore for a few minutes afterward (like he had worked out), and now is normal. Patient experienced no other symptoms and is completely asymptomatic now.  Patient just wanted to let Dr. Johney FrameAllred know about this episode and that he has been off his Zocor for 3 days (he was worried it would put him back in fib) but is resuming tonight.

## 2014-03-31 NOTE — Telephone Encounter (Signed)
New problem   Pt is having muscle jerking and tightness in both arms. Pt need to speak to nurse.

## 2014-04-08 ENCOUNTER — Encounter: Payer: Self-pay | Admitting: Endocrinology

## 2014-04-08 ENCOUNTER — Other Ambulatory Visit: Payer: Self-pay | Admitting: *Deleted

## 2014-04-08 ENCOUNTER — Other Ambulatory Visit (INDEPENDENT_AMBULATORY_CARE_PROVIDER_SITE_OTHER): Payer: BC Managed Care – PPO

## 2014-04-08 ENCOUNTER — Ambulatory Visit (INDEPENDENT_AMBULATORY_CARE_PROVIDER_SITE_OTHER): Payer: BC Managed Care – PPO | Admitting: Endocrinology

## 2014-04-08 VITALS — BP 128/68 | HR 71 | Temp 98.6°F | Resp 14 | Ht 71.0 in | Wt 241.6 lb

## 2014-04-08 DIAGNOSIS — E1165 Type 2 diabetes mellitus with hyperglycemia: Secondary | ICD-10-CM

## 2014-04-08 DIAGNOSIS — IMO0002 Reserved for concepts with insufficient information to code with codable children: Secondary | ICD-10-CM

## 2014-04-08 DIAGNOSIS — E785 Hyperlipidemia, unspecified: Secondary | ICD-10-CM

## 2014-04-08 LAB — BASIC METABOLIC PANEL
BUN: 13 mg/dL (ref 6–23)
CHLORIDE: 104 meq/L (ref 96–112)
CO2: 23 mEq/L (ref 19–32)
Calcium: 9 mg/dL (ref 8.4–10.5)
Creatinine, Ser: 1.3 mg/dL (ref 0.4–1.5)
GFR: 62.52 mL/min (ref >60.00)
Glucose, Bld: 183 mg/dL — ABNORMAL HIGH (ref 70–99)
POTASSIUM: 4 meq/L (ref 3.5–5.1)
SODIUM: 137 meq/L (ref 135–145)

## 2014-04-08 LAB — HEMOGLOBIN A1C: HEMOGLOBIN A1C: 6.3 % (ref 4.6–6.5)

## 2014-04-08 NOTE — Patient Instructions (Addendum)
May try 22 Novolog at supper if eating less  Lantus 58 or 60 at 8 pm daily and keep am sugars 120-140 range  Walk 3/7 days briskly

## 2014-04-08 NOTE — Progress Notes (Signed)
Patient ID: Danny [name], male   DOB: [birth year], 53 y.o.   MRN: [id number]     Reason for Appointment: Followup for Type 2 Diabetes  Referring physician: Drue Novel  History of Present Illness:          Diagnosis: Type 2 diabetes mellitus, date of diagnosis: 1992       Past history:  He has had long-standing diabetes and previously was treated with metformin, Amaryl, Januvia and Actos He was taken off metformin because of fear of liver problems.  May have been taken taken off Actos because of swelling of his legs but was on 45 mg at that time Over the last 2 years at least his A1c has been consistently over 8% He was started on insulin in 2011 probably when his A1c was over 9% Initially was given Lantus and Humalog and subsequently switched to 70/30 twice a day for unknown reasons Has not taken Byetta or Victoza in the past He had been on 70/30 insulin for the last few years  Recent history:   INSULIN regimen is described as:  NovoLog 18-25-25 Lantus 56 at bedtime  Because of persistent hyperglycemia and A1c consistently over 8% he was switched to a basal bolus insulin regimen with Lantus and NovoLog insulin 70/30 insulin He was also given Invokamet 50/1000 twice a day as he was not taking any other agents with insulin and had significant obesity With this regimen has had much better blood sugar control Also has been checking his blood sugars fairly consistently throughout the day No side effects with Invokamet and renal function has been stable On his last visit he was told to reduce his NovoLog at lunch and supper because of relatively lower readings but he has not changed the regimen He did have one episode of hypoglycemia after late-night meals and he ate less unexpected Currently his blood sugars are relatively higher fasting; he says  that some of these are related to eating sweets late at night  No further symptoms of hypoglycemia       Oral hypoglycemic drugs the patient is  taking are: Invokamet twice a day    Side effects from medications have been: None  Compliance with the medical regimen: Good Hypoglycemia: Once after dinner  Glucose monitoring:  done 3.2 times a day         Glucometer: One Touch.      Blood Glucose readings    PREMEAL Breakfast Lunch Dinner Bedtime Overall  Glucose range:  131-203   86-139  99-178   69-161    Mean/median:  160   124    136    Glycemic control:   Lab Results  Component Value Date   HGBA1C 6.3 04/08/2014   HGBA1C 8.1* 10/27/2013   HGBA1C 8.4* 07/17/2013   Lab Results  Component Value Date   MICROALBUR 0.2 10/27/2013   LDLCALC 67 03/31/2013   CREATININE 1.3 04/08/2014    Retinal exam: annual    Self-care: The diet that the patient has been following is: None    Meals: 3 meals per day. Breakfast is eggs or cereal usually,  controlling portions           Exercise:  he is active with moving equipment at work and some walking, does drive a truck         Dietician visit: Most recent: years ago.               Weight history:  Wt Readings from Last  3 Encounters:  04/08/14 241 lb 9.6 oz (109.589 kg)  03/30/14 237 lb 1.9 oz (107.557 kg)  03/24/14 232 lb 12.9 oz (105.6 kg)       Medication List       This list is accurate as of: 04/08/14  8:59 PM.  Always use your most recent med list.               colchicine 0.6 MG tablet  Commonly known as:  COLCRYS  Take 1 tablet (0.6 mg total) by mouth daily.     dabigatran 150 MG Caps capsule  Commonly known as:  PRADAXA  Take 150 mg by mouth 2 (two) times daily.     fish oil-omega-3 fatty acids 1000 MG capsule  Take 2 g by mouth 2 (two) times daily.     fluticasone 50 MCG/ACT nasal spray  Commonly known as:  FLONASE  Place 2 sprays into both nostrils daily.     furosemide 20 MG tablet  Commonly known as:  LASIX  Take 20 mg by mouth daily.     gabapentin 600 MG tablet  Commonly known as:  NEURONTIN  Take 1 tablet (600 mg total) by mouth 3 (three) times  daily.     glucagon 1 MG injection  Commonly known as:  GLUCAGON EMERGENCY  Inject 1 mg into the vein once as needed.     insulin aspart 100 UNIT/ML injection  Commonly known as:  novoLOG  Inject 18-25 Units into the skin 3 (three) times daily before meals. Takes 18 units in am, 25 units at lunch, 25 units in pm     insulin glargine 100 UNIT/ML injection  Commonly known as:  LANTUS  Inject 56 Units into the skin at bedtime.     INVOKAMET 50-1000 MG Tabs  Generic drug:  Canagliflozin-Metformin HCl  Take 1 tablet by mouth 2 (two) times daily.     loratadine 10 MG tablet  Commonly known as:  CLARITIN  Take 10 mg by mouth daily as needed for allergies.     metoprolol succinate 25 MG 24 hr tablet  Commonly known as:  TOPROL-XL  Take 12.5 mg by mouth daily.     pantoprazole 40 MG tablet  Commonly known as:  PROTONIX  Take 1 tablet (40 mg total) by mouth daily.     simvastatin 20 MG tablet  Commonly known as:  ZOCOR  Take 10 mg by mouth at bedtime.     tamsulosin 0.4 MG Caps capsule  Commonly known as:  FLOMAX  Take 1 capsule (0.4 mg total) by mouth daily.        Allergies:  Allergies  Allergen Reactions  . Metformin Other (See Comments)    REACTION: pt states caused liver injury some years ago, currently taking invokamet    Past Medical History  Diagnosis Date  . Diabetes mellitus     Type II dx 1999  . Hyperlipidemia   . Hypertension   . Benign prostatic hypertrophy     s/p TUNA procedure aprx 2006,  . Carpal tunnel syndrome 06/17/2009  . HYPERSOMNIA UNSPECIFIED 12/11/2007  . INSOMNIA-SLEEP DISORDER-UNSPEC 04/12/2010  . Allergic rhinitis   . History of cardiovascular stress test 8/11    Negative  . ED (erectile dysfunction)     After TUNA  . Elevated LFTs 2010    (-) chronic hep  panel  . Atrial fibrillation 12/21/2009    paroxysmal. Failed Multaq    Past Surgical History  Procedure Laterality Date  .  Vasectomy      several years ago  . Prostate  surgery      TUNA  . Cystoscopy  11-09    neg  @ urology, was rx Flomax  . Tee without cardioversion N/A 03/23/2014    Procedure: TRANSESOPHAGEAL ECHOCARDIOGRAM (TEE);  Surgeon: Pricilla Riffle, MD;  Location: [facility] ENDOSCOPY;  Service: Cardiovascular;  Laterality: N/A;  . Ablation  03-24-14    PVI by Dr Johney Frame    Family History  Problem Relation Age of Onset  . Heart disease Mother     M-- MI,CHF  . Diabetes Mother   . Hypertension Mother   . Hypertension Father   . Cancer Neg Hx     Negative FH of Colon or Prostate Cancer    Social History:  reports that he quit smoking about 28 years ago. His smoking use included Cigarettes. He smoked 0.00 packs per day. He has never used smokeless tobacco. He reports that he does not drink alcohol or use illicit drugs.    Review of Systems       Lipids: He has been on low dose simvastatin for a few years       Lab Results  Component Value Date   CHOL 120 03/31/2013   HDL 32.30* 03/31/2013   LDLCALC 67 03/31/2013   TRIG 103.0 03/31/2013   CHOLHDL 4 03/31/2013               No home BP monitoring, no hypertension and blood pressure was high initially when he came in today       For several years has a history of Numbness, tingling  in feet and lower legs especially the right side. Relieved by gabapentin. Also has had less symptoms when his blood sugars were improved, having some symptoms now.  He has had persistent pain in the right lower leg from his episode of shingles  Lasix was stopped  with continuing Invokana, no recent edema   LABS:  Appointment on 04/08/2014  Component Date Value Ref Range Status  . Hemoglobin A1C 04/08/2014 6.3  4.6 - 6.5 % Final   Glycemic Control Guidelines for People with Diabetes:Non Diabetic:  <6%Goal of Therapy: <7%Additional Action Suggested:  >8%   . Sodium 04/08/2014 137  135 - 145 mEq/L Final  . Potassium 04/08/2014 4.0  3.5 - 5.1 mEq/L Final  . Chloride 04/08/2014 104  96 - 112 mEq/L Final  . CO2  04/08/2014 23  19 - 32 mEq/L Final  . Glucose, Bld 04/08/2014 183* 70 - 99 mg/dL Final  . BUN 16/03/9603 13  6 - 23 mg/dL Final  . Creatinine, Ser 04/08/2014 1.3  0.4 - 1.5 mg/dL Final  . Calcium 54/02/8118 9.0  8.4 - 10.5 mg/dL Final  . GFR 14/78/2956 62.52  >60.00 mL/min Final    Physical Examination:  BP 128/68  Pulse 71  Temp(Src) 98.6 F (37 C)  Resp 14  Ht 5\' 11"  (1.803 m)  Wt 241 lb 9.6 oz (109.589 kg)  BMI 33.71 kg/m2  SpO2 93%  No pedal edema  ASSESSMENT/PLAN:    Diabetes type 2  with obesity As discussed in history of present illness he has had good control of his diabetes with his basal bolus insulin regimen  along with metformin and Invokana He has done well with glucose monitoring He was told to reduce his mealtime insulin coverage but he has not done so and has had episodes of hypoglycemia only once recently Blood sugars are fairly consistent  throughout the day His main difficulty is fasting hyperglycemia especially if he is eating sweets at bedtime He is also trying to improve his diet which was previously relatively high fat  He will take his Lantus for convenience at 8 PM for better compliance and increase the dose at least 2 units, discussed blood sugar fasting target to be at least under 130 Currently his bedtime Lantus is working out fairly good and will not change this, Toujeo is probably not covered He will try to improve his diet especially late night snacks  Also discussed postprandial targets and adjustment of mealtime dose based on carbohydrate intake meal size He does need to exercise more regularly especially on the days he is not working  History of hyperlipidemia: He needs followup levels , to be done on next visit  Patient Instructions  May try 22 Novolog at supper if eating less  Lantus 58 or 60 at 8 pm daily and keep am sugars 120-140 range  Walk 3/7 days briskly   Counseling time over 50% of today's 25 minute  visit  Danny [name] 04/08/2014, 8:59 PM   Note: This office note was prepared with Dragon voice recognition system technology. Any transcriptional errors that result from this process are unintentional. . Addendum: A1c excellent  Lab Results  Component Value Date   HGBA1C 6.3 04/08/2014

## 2014-04-09 ENCOUNTER — Other Ambulatory Visit: Payer: Self-pay

## 2014-04-09 MED ORDER — TAMSULOSIN HCL 0.4 MG PO CAPS: 0.4000 mg | ORAL_CAPSULE | Freq: Every day | ORAL | Status: DC

## 2014-05-01 ENCOUNTER — Encounter: Payer: Self-pay | Admitting: Internal Medicine

## 2014-05-04 ENCOUNTER — Other Ambulatory Visit: Payer: Self-pay | Admitting: Endocrinology

## 2014-05-25 ENCOUNTER — Other Ambulatory Visit: Payer: Self-pay

## 2014-05-25 ENCOUNTER — Other Ambulatory Visit: Payer: Self-pay | Admitting: *Deleted

## 2014-05-25 ENCOUNTER — Telehealth: Payer: Self-pay | Admitting: Endocrinology

## 2014-05-25 MED ORDER — GLUCOSE BLOOD VI STRP: ORAL_STRIP | Status: DC

## 2014-05-25 NOTE — Telephone Encounter (Signed)
Pt needs dosage changed for his verio test strips he checks 4 times a day

## 2014-05-25 NOTE — Telephone Encounter (Signed)
rx sent for 4 times per day

## 2014-05-26 ENCOUNTER — Other Ambulatory Visit: Payer: Self-pay

## 2014-05-26 ENCOUNTER — Telehealth: Payer: Self-pay | Admitting: Endocrinology

## 2014-05-26 ENCOUNTER — Telehealth: Payer: Self-pay | Admitting: Internal Medicine

## 2014-05-26 MED ORDER — FLUTICASONE PROPIONATE 50 MCG/ACT NA SUSP: 2.0000 | Freq: Every day | NASAL | Status: DC

## 2014-05-26 NOTE — Telephone Encounter (Signed)
Refill sent approximately an hour ago to pharmacy, received confirmation by pharmacy at 11:58 am that they have received refill.

## 2014-05-26 NOTE — Telephone Encounter (Signed)
rx was sent yesterday  

## 2014-05-26 NOTE — Telephone Encounter (Signed)
Please call in verio test strips he uses 4 daily

## 2014-05-26 NOTE — Telephone Encounter (Signed)
Caller name: Danny [name], Myan Relation to ZO:XWRUpt:self Call back number:[phone] Pharmacy: [facility], [place]  Reason for call: pt is needing rx fluticasone (FLONASE) 50 MCG/ACT nasal spray, states pharmacy has sent two request but have not gotten a response.

## 2014-05-27 ENCOUNTER — Other Ambulatory Visit: Payer: Self-pay | Admitting: Endocrinology

## 2014-06-04 ENCOUNTER — Other Ambulatory Visit: Payer: Self-pay | Admitting: Endocrinology

## 2014-06-04 ENCOUNTER — Telehealth: Payer: Self-pay | Admitting: Cardiology

## 2014-06-04 ENCOUNTER — Encounter (HOSPITAL_COMMUNITY): Payer: Self-pay | Admitting: Internal Medicine

## 2014-06-04 NOTE — Telephone Encounter (Signed)
If Dr Johney FrameAllred would like to follow him long term, ok with me Danny MillersBrian Alysen Smylie

## 2014-06-04 NOTE — Telephone Encounter (Signed)
New Message  Pt requested to speak with nurse. Please call back and discuss.  

## 2014-06-04 NOTE — Telephone Encounter (Signed)
Spoke with pt wife, they are questioning if they need to see both dr allred and dr Jens Somcrenshaw for his atrial fib. Explained they probably do not need to see both but will forward to dr Jens Somcrenshaw to see which doctor would be best to follow. Pt agreed with this plan.

## 2014-06-05 ENCOUNTER — Encounter: Payer: Self-pay | Admitting: Internal Medicine

## 2014-06-05 ENCOUNTER — Ambulatory Visit (INDEPENDENT_AMBULATORY_CARE_PROVIDER_SITE_OTHER): Payer: BC Managed Care – PPO | Admitting: Internal Medicine

## 2014-06-05 VITALS — BP 128/83 | HR 79 | Temp 97.9°F | Ht 71.0 in | Wt 217.1 lb

## 2014-06-05 DIAGNOSIS — E1142 Type 2 diabetes mellitus with diabetic polyneuropathy: Secondary | ICD-10-CM

## 2014-06-05 DIAGNOSIS — Z0000 Encounter for general adult medical examination without abnormal findings: Secondary | ICD-10-CM

## 2014-06-05 DIAGNOSIS — I480 Paroxysmal atrial fibrillation: Secondary | ICD-10-CM

## 2014-06-05 DIAGNOSIS — E785 Hyperlipidemia, unspecified: Secondary | ICD-10-CM

## 2014-06-05 NOTE — Progress Notes (Signed)
Subjective:    Patient ID: Danny [name], male    DOB: 28-Jun-1960, 53 y.o.   MRN: [id number]  DOS:  06/05/2014 Type of visit - description : cpx  Interval history: Since he has the ablation 3 months ago he is feeling great, has been in sinus rhythm  since, denies chest pain, difficulty breathing, palpitations. He is much more energetic. Is eating healthy and has increased his physical activity, has dropped a large portion of his insulin. See assessment and plan. Very rarely has low blood sugar symptoms, usually if he is very physically active, symptoms resolve with sugar intake   ROS Denies nausea, vomiting, diarrhea or blood in the stools From time to time has dysuria, no gross hematuria difficulty urinating.  Past Medical History  Diagnosis Date  . Diabetes mellitus     Type II dx 1999  . Hyperlipidemia   . Hypertension   . Benign prostatic hypertrophy     s/p TUNA procedure aprx 2006,  . Carpal tunnel syndrome 06/17/2009  . HYPERSOMNIA UNSPECIFIED 12/11/2007  . INSOMNIA-SLEEP DISORDER-UNSPEC 04/12/2010  . Allergic rhinitis   . History of cardiovascular stress test 8/11    Negative  . ED (erectile dysfunction)     After TUNA  . Elevated LFTs 2010    (-) chronic hep  panel  . Atrial fibrillation 12/21/2009    paroxysmal. Failed Multaq    Past Surgical History  Procedure Laterality Date  . Vasectomy      several years ago  . Prostate surgery      TUNA  . Cystoscopy  11-09    neg  @ urology, was rx Flomax  . Tee without cardioversion N/A 03/23/2014    Procedure: TRANSESOPHAGEAL ECHOCARDIOGRAM (TEE);  Surgeon: Pricilla RifflePaula Ross V, MD;  [facility];  Service: Cardiovascular;  Laterality: N/A;  . Ablation  03-24-14    PVI by Dr Johney FrameAllred  . Atrial fibrillation ablation N/A 03/24/2014    Procedure: ATRIAL FIBRILLATION ABLATION;  Surgeon: Gardiner RhymeJames D Allred, MD;  Location: [facility] CATH LAB;  Service: Cardiovascular;  Laterality: N/A;    History   Social History  . Marital  Status: Married    Spouse Name: N/A    Number of Children: 2  . Years of Education: N/A   Occupational History  . Works for [employer] (state[place]  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 Smoking status: Former Smoker    Types: Cigarettes    Quit date: 06/26/1985  . Smokeless tobacco: Never Used  . Alcohol Use: No     Comment:    . Drug Use: No  . Sexual Activity: Not on file   Other Topics Concern  . Not on file   Social History Narrative   has a girlfriend, lives in [place]              Family History  Problem Relation Age of Onset  . Heart disease Mother     M-- MI,CHF  . Diabetes Mother   . Hypertension Mother   . Hypertension Father   . Cancer Neg Hx     Negative FH of Colon or Prostate Cancer      Medication List       This list is accurate as of: 06/05/14 11:59 PM.  Always use your most recent med list.               dabigatran 150 MG Caps  capsule  Commonly known as:  PRADAXA  Take 150 mg by mouth 2 (two) times daily.     fish oil-omega-3 fatty acids 1000 MG capsule  Take 2 g by mouth 2 (two) times daily.     fluticasone 50 MCG/ACT nasal spray  Commonly known as:  FLONASE  Place 2 sprays into both nostrils daily.     furosemide 20 MG tablet  Commonly known as:  LASIX  Take 20 mg by mouth daily.     gabapentin 600 MG tablet  Commonly known as:  NEURONTIN  Take 1 tablet (600 mg total) by mouth 3 (three) times daily.     glucagon 1 MG injection  Commonly known as:  GLUCAGON EMERGENCY  Inject 1 mg into the vein once as needed.     glucose blood test strip  Commonly known as:  ONETOUCH VERIO  Use as instructed to check blood sugar 4 times per day dx code E11.9     insulin aspart 100 UNIT/ML injection  Commonly known as:  novoLOG  Inject 18-25 Units into the skin 3 (three) times daily before meals. Takes 18 units in am, 25 units at lunch, 25 units in pm     insulin glargine 100 UNIT/ML injection    Commonly known as:  LANTUS  Inject 52 Units into the skin at bedtime.     INSULIN SYRINGE .5CC/31GX5/16" 31G X 5/16" 0.5 ML Misc  USE ONE SYRINGE AS DIRECTED THREE TO FOUR TIMES DAILY     INVOKAMET 50-1000 MG Tabs  Generic drug:  Canagliflozin-Metformin HCl  TAKE 1 TABLET BY MOUTH TWICE DAILY     loratadine 10 MG tablet  Commonly known as:  CLARITIN  Take 10 mg by mouth daily as needed for allergies.     metoprolol succinate 25 MG 24 hr tablet  Commonly known as:  TOPROL-XL  Take 12.5 mg by mouth daily.     simvastatin 20 MG tablet  Commonly known as:  ZOCOR  Take 10 mg by mouth at bedtime.     tamsulosin 0.4 MG Caps capsule  Commonly known as:  FLOMAX  Take 1 capsule (0.4 mg total) by mouth daily.           Objective:   Physical Exam BP 128/83 mmHg  Pulse 79  Temp(Src) 97.9 F (36.6 C) (Oral)  Ht 5\' 11"  (1.803 m)  Wt 217 lb 2 oz (98.487 kg)  BMI 30.30 kg/m2  SpO2 94% General -- alert, well-developed, NAD.  Neck --no thyromegaly  HEENT-- Not pale.  Lungs -- normal respiratory effort, no intercostal retractions, no accessory muscle use, and normal breath sounds.  Heart-- normal rate, regular rhythm, no murmur.  Abdomen-- Not distended, good bowel sounds,soft, non-tender. Extremities-- no pretibial edema bilaterally  Neurologic--  alert & oriented X3. Speech normal, gait appropriate for age, strength symmetric and appropriate for age.  Psych-- Cognition and judgment appear intact. Cooperative with normal attention span and concentration. No anxious or depressed appearing.        Assessment & Plan:  Hyperlipidemia Continue simvastatin, check FLP

## 2014-06-05 NOTE — Progress Notes (Signed)
Pre visit review using our clinic review tool, if applicable. No additional management support is needed unless otherwise documented below in the visit note. 

## 2014-06-05 NOTE — Telephone Encounter (Signed)
Will forward to dr allred

## 2014-06-05 NOTE — Patient Instructions (Signed)
Please schedule blood work to be done next week, fasting  Continue taking the medications as you're doing Continue holding NovoLog Be sure you see your cardiologist and endocrinologist  Watch for low sugar symptoms and carry glucose with you  Next visit to see me in 6-8 months   Hypoglycemia Hypoglycemia occurs when the glucose in your blood is too low. Glucose is a type of sugar that is your body's main energy source. Hormones, such as insulin and glucagon, control the level of glucose in the blood. Insulin lowers blood glucose and glucagon increases blood glucose. Having too much insulin in your blood stream, or not eating enough food containing sugar, can result in hypoglycemia. Hypoglycemia can happen to people with or without diabetes. It can develop quickly and can be a medical emergency.  CAUSES   Missing or delaying meals.  Not eating enough carbohydrates at meals.  Taking too much diabetes medicine.  Not timing your oral diabetes medicine or insulin doses with meals, snacks, and exercise.  Nausea and vomiting.  Certain medicines.  Severe illnesses, such as hepatitis, kidney disorders, and certain eating disorders.  Increased activity or exercise without eating something extra or adjusting medicines.  Drinking too much alcohol.  A nerve disorder that affects body functions like your heart rate, blood pressure, and digestion (autonomic neuropathy).  A condition where the stomach muscles do not function properly (gastroparesis). Therefore, medicines and food may not absorb properly.  Rarely, a tumor of the pancreas can produce too much insulin. SYMPTOMS   Hunger.  Sweating (diaphoresis).  Change in body temperature.  Shakiness.  Headache.  Anxiety.  Lightheadedness.  Irritability.  Difficulty concentrating.  Dry mouth.  Tingling or numbness in the hands or feet.  Restless sleep or sleep disturbances.  Altered speech and coordination.  Change in  mental status.  Seizures or prolonged convulsions.  Combativeness.  Drowsiness (lethargic).  Weakness.  Increased heart rate or palpitations.  Confusion.  Pale, gray skin color.  Blurred or double vision.  Fainting. DIAGNOSIS  A physical exam and medical history will be performed. Your caregiver may make a diagnosis based on your symptoms. Blood tests and other lab tests may be performed to confirm a diagnosis. Once the diagnosis is made, your caregiver will see if your signs and symptoms go away once your blood glucose is raised.  TREATMENT  Usually, you can easily treat your hypoglycemia when you notice symptoms.  Check your blood glucose. If it is less than 70 mg/dl, take one of the following:   3-4 glucose tablets.    cup juice.    cup regular soda.   1 cup skim milk.   -1 tube of glucose gel.   5-6 hard candies.   Avoid high-fat drinks or food that may delay a rise in blood glucose levels.  Do not take more than the recommended amount of sugary foods, drinks, gel, or tablets. Doing so will cause your blood glucose to go too high.   Wait 10-15 minutes and recheck your blood glucose. If it is still less than 70 mg/dl or below your target range, repeat treatment.   Eat a snack if it is more than 1 hour until your next meal.  There may be a time when your blood glucose may go so low that you are unable to treat yourself at home when you start to notice symptoms. You may need someone to help you. You may even faint or be unable to swallow. If you cannot treat yourself,  someone will need to bring you to the hospital.  [place]  If you have diabetes, follow your diabetes management plan by:  Taking your medicines as directed.  Following your exercise plan.  Following your meal plan. Do not skip meals. Eat on time.  Testing your blood glucose regularly. Check your blood glucose before and after exercise. If you exercise longer or  different than usual, be sure to check blood glucose more frequently.  Wearing your medical alert jewelry that says you have diabetes.  Identify the cause of your hypoglycemia. Then, develop ways to prevent the recurrence of hypoglycemia.  Do not take a hot bath or shower right after an insulin shot.  Always carry treatment with you. Glucose tablets are the easiest to carry.  If you are going to drink alcohol, drink it only with meals.  Tell friends or family members ways to keep you safe during a seizure. This may include removing hard or sharp objects from the area or turning you on your side.  Maintain a healthy weight. SEEK MEDICAL CARE IF:   You are having problems keeping your blood glucose in your target range.  You are having frequent episodes of hypoglycemia.  You feel you might be having side effects from your medicines.  You are not sure why your blood glucose is dropping so low.  You notice a change in vision or a new problem with your vision. SEEK IMMEDIATE MEDICAL CARE IF:   Confusion develops.  A change in mental status occurs.  The inability to swallow develops.  Fainting occurs. Document Released: 06/12/2005 Document Revised: 06/17/2013 Document Reviewed: 10/09/2011 [facility] Patient Information 2015 [place], [place]. This information is not intended to replace advice given to you by your health care provider. Make sure you discuss any questions you have with your health care provider.

## 2014-06-05 NOTE — Assessment & Plan Note (Addendum)
Tdap 2012 Had a flu shot at work pnm shot 2015 Had cscopes w/  [place]ales [place]Gu[place], had a cscope 2014, pt reports ok, no documentation, again rec to get records  Prostate cancer screening, DRE and PSA normal 2014, next check 2016 Diet-exercise -doing great, has lost a significant amount of weight, eating healthier and he remains very active

## 2014-06-06 NOTE — Assessment & Plan Note (Signed)
Doing great, ++ wt loss, better life style. Follow-up by endocrinology, he self discontinue NovoLog and decrease Lantus to 52 units daily Plan: Continue holding NovoLog, watch for low blood sugar symptoms, see endocrinology as planned

## 2014-06-06 NOTE — Assessment & Plan Note (Signed)
Status post an ablation, seems to be doing well, last visit with cardiology 03/30/2014. Plan is to continue with metoprolol, Pradaxa, has a follow-up with cardiology already.

## 2014-06-06 NOTE — Telephone Encounter (Signed)
Given his diabetes and HTN, he should probably keep a general cardiologist.  Once we are a ways out from ablation, I would like for him to return to Dr Jens Somrenshaw for long term management.  I would like to continue to follow in the short term until his afib gets settled.  I will discuss with patient when I see him again.

## 2014-06-08 ENCOUNTER — Other Ambulatory Visit (INDEPENDENT_AMBULATORY_CARE_PROVIDER_SITE_OTHER): Payer: BC Managed Care – PPO

## 2014-06-08 DIAGNOSIS — Z0000 Encounter for general adult medical examination without abnormal findings: Secondary | ICD-10-CM

## 2014-06-08 LAB — LIPID PANEL
CHOL/HDL RATIO: 3
Cholesterol: 101 mg/dL (ref 0–200)
HDL: 30.8 mg/dL — AB (ref >39.00)
LDL Cholesterol: 54 mg/dL (ref 0–99)
NONHDL: 70.2
Triglycerides: 80 mg/dL (ref 0.0–149.0)
VLDL: 16 mg/dL (ref 0.0–40.0)

## 2014-06-08 LAB — BASIC METABOLIC PANEL
BUN: 14 mg/dL (ref 6–23)
CALCIUM: 9.2 mg/dL (ref 8.4–10.5)
CO2: 29 meq/L (ref 19–32)
Chloride: 104 mEq/L (ref 96–112)
Creatinine, Ser: 0.9 mg/dL (ref 0.4–1.5)
GFR: 99.32 mL/min (ref >60.00)
GLUCOSE: 92 mg/dL (ref 70–99)
POTASSIUM: 4.3 meq/L (ref 3.5–5.1)
Sodium: 138 mEq/L (ref 135–145)

## 2014-06-09 ENCOUNTER — Encounter: Payer: Self-pay | Admitting: Cardiology

## 2014-06-09 NOTE — Telephone Encounter (Signed)
This encounter was created in error - please disregard.

## 2014-06-09 NOTE — Telephone Encounter (Signed)
Pt is returning Debra's call ° °Thanks °

## 2014-06-17 NOTE — Telephone Encounter (Signed)
Left message for patient, feb appt with dr Jens Somcrenshaw was cancelled. Patient made aware he will follow with dr allred for now.

## 2014-06-29 ENCOUNTER — Other Ambulatory Visit: Payer: Self-pay

## 2014-06-29 MED ORDER — SIMVASTATIN 20 MG PO TABS: 10.0000 mg | ORAL_TABLET | Freq: Every day | ORAL | Status: DC

## 2014-07-06 ENCOUNTER — Other Ambulatory Visit: Payer: Self-pay | Admitting: Cardiology

## 2014-07-09 ENCOUNTER — Ambulatory Visit (INDEPENDENT_AMBULATORY_CARE_PROVIDER_SITE_OTHER): Payer: BC Managed Care – PPO | Admitting: Endocrinology

## 2014-07-09 ENCOUNTER — Encounter: Payer: Self-pay | Admitting: Endocrinology

## 2014-07-09 ENCOUNTER — Other Ambulatory Visit (INDEPENDENT_AMBULATORY_CARE_PROVIDER_SITE_OTHER): Payer: BC Managed Care – PPO

## 2014-07-09 VITALS — BP 128/71 | HR 85 | Temp 98.3°F | Resp 14 | Ht 71.0 in | Wt 209.0 lb

## 2014-07-09 DIAGNOSIS — E119 Type 2 diabetes mellitus without complications: Secondary | ICD-10-CM

## 2014-07-09 DIAGNOSIS — E1165 Type 2 diabetes mellitus with hyperglycemia: Secondary | ICD-10-CM

## 2014-07-09 DIAGNOSIS — Z794 Long term (current) use of insulin: Secondary | ICD-10-CM

## 2014-07-09 DIAGNOSIS — IMO0002 Reserved for concepts with insufficient information to code with codable children: Secondary | ICD-10-CM

## 2014-07-09 LAB — COMPREHENSIVE METABOLIC PANEL
ALT: 21 U/L (ref 0–53)
AST: 18 U/L (ref 0–37)
Albumin: 4.2 g/dL (ref 3.5–5.2)
Alkaline Phosphatase: 92 U/L (ref 39–117)
BUN: 15 mg/dL (ref 6–23)
CALCIUM: 9.3 mg/dL (ref 8.4–10.5)
CO2: 30 meq/L (ref 19–32)
Chloride: 100 mEq/L (ref 96–112)
Creatinine, Ser: 1.06 mg/dL (ref 0.40–1.50)
GFR: 76.95 mL/min (ref >60.00)
GLUCOSE: 157 mg/dL — AB (ref 70–99)
POTASSIUM: 3.8 meq/L (ref 3.5–5.1)
SODIUM: 136 meq/L (ref 135–145)
Total Bilirubin: 0.4 mg/dL (ref 0.2–1.2)
Total Protein: 7.4 g/dL (ref 6.0–8.3)

## 2014-07-09 LAB — HEMOGLOBIN A1C: Hgb A1c MFr Bld: 5.8 % (ref 4.6–6.5)

## 2014-07-09 NOTE — Progress Notes (Signed)
Patient ID: Danny [name], male   DOB: June 26, 1961, 54 y.o.   MRN: [id number]     Reason for Appointment: Followup for Type 2 Diabetes  Referring physician: Drue Novel  History of Present Illness:          Diagnosis: Type 2 diabetes mellitus, date of diagnosis: 1992       Past history:  He has had long-standing diabetes and previously was treated with metformin, Amaryl, Januvia and Actos He was taken off metformin because of fear of liver problems.  May have been taken taken off Actos because of swelling of his legs but was on 45 mg at that time Over the last 2 years at least his A1c has been consistently over 8% He was started on insulin in 2011 probably when his A1c was over 9% Initially was given Lantus and Humalog and subsequently switched to 70/30 twice a day for unknown reasons Has not taken Byetta or Victoza in the past He had been on 70/30 insulin for the last few years  Recent history:   INSULIN regimen is described as:  NovoLog none; Lantus 52 units at 9 pm  His blood sugars are dramatically better now since his last visit with his losing about 40 pounds of weight He says that he started feeling much better after his atrial ablation and was more active Previously A1c was 8% or more consistently before starting Invokamet. On his last visit  he was having relatively high fasting readings He is generally watching his diet also Because of relatively lower blood sugars he has stopped taking his NovoLog at meals.  He does however have some relatively high readings but not over 782 after certain meals.  He thinks these are only high he is going off his diet He recently switched to monitor to the One Touch brand and blood sugars seem to be slightly higher than before on his Accu-Chek He has reduced his Lantus by 4 units; however fasting blood sugars are mildly increased except for today He continues to take Invokamet consistently A1c is in the normal range now       Oral hypoglycemic  drugs the patient is taking are: Invokamet twice a day    Side effects from medications have been: None  Compliance with the medical regimen: Good Hypoglycemia: None recently with lowest blood sugar 76 fasting today and had a low reading of 67 after evening meal in 12/15  Glucose monitoring:  done 3.2 times a day         Glucometer: One Touch.      Blood Glucose readings    PRE-MEAL Breakfast Lunch Dinner  PCS  Overall  Glucose range:  76-144   84-175   92-116   86-193    Mean/median: 112  104   109   138  111   Glycemic control:   Lab Results  Component Value Date   HGBA1C 5.8 07/09/2014   HGBA1C 6.3 04/08/2014   HGBA1C 8.1* 10/27/2013   Lab Results  Component Value Date   MICROALBUR 0.2 10/27/2013   LDLCALC 54 06/08/2014   CREATININE 1.06 07/09/2014    Retinal exam: annual    Self-care: The diet that the patient has been following is: None    Meals: 3 meals per day. Breakfast is eggs or cereal usually,  controlling portions           Exercise:  he is active with moving equipment at work and some walking, does drive a truck  Dietician visit: Most recent: years ago.               Weight history:  Wt Readings from Last 3 Encounters:  07/09/14 209 lb (94.802 kg)  06/05/14 217 lb 2 oz (98.487 kg)  04/08/14 241 lb 9.6 oz (109.589 kg)       Medication List       This list is accurate as of: 07/09/14 11:59 PM.  Always use your most recent med list.               fish oil-omega-3 fatty acids 1000 MG capsule  Take 2 g by mouth 2 (two) times daily.     fluticasone 50 MCG/ACT nasal spray  Commonly known as:  FLONASE  Place 2 sprays into both nostrils daily.     furosemide 20 MG tablet  Commonly known as:  LASIX  Take 20 mg by mouth daily.     gabapentin 600 MG tablet  Commonly known as:  NEURONTIN  Take 1 tablet (600 mg total) by mouth 3 (three) times daily.     glucagon 1 MG injection  Commonly known as:  GLUCAGON EMERGENCY  Inject 1 mg into the  vein once as needed.     glucose blood test strip  Commonly known as:  ONETOUCH VERIO  Use as instructed to check blood sugar 4 times per day dx code E11.9     insulin aspart 100 UNIT/ML injection  Commonly known as:  novoLOG  Inject 18-25 Units into the skin 3 (three) times daily before meals. Takes 18 units in am, 25 units at lunch, 25 units in pm     insulin glargine 100 UNIT/ML injection  Commonly known as:  LANTUS  Inject 52 Units into the skin at bedtime.     INSULIN SYRINGE .5CC/31GX5/16" 31G X 5/16" 0.5 ML Misc  USE ONE SYRINGE AS DIRECTED THREE TO FOUR TIMES DAILY     INVOKAMET 50-1000 MG Tabs  Generic drug:  Canagliflozin-Metformin HCl  TAKE 1 TABLET BY MOUTH TWICE DAILY     loratadine 10 MG tablet  Commonly known as:  CLARITIN  Take 10 mg by mouth daily as needed for allergies.     metoprolol succinate 25 MG 24 hr tablet  Commonly known as:  TOPROL-XL  Take 12.5 mg by mouth daily.     PRADAXA 150 MG Caps capsule  Generic drug:  dabigatran  TAKE 1 CAPSULE BY MOUTH EVERY 12 HOURS     simvastatin 20 MG tablet  Commonly known as:  ZOCOR  Take 0.5 tablets (10 mg total) by mouth at bedtime.     tamsulosin 0.4 MG Caps capsule  Commonly known as:  FLOMAX  Take 1 capsule (0.4 mg total) by mouth daily.        Allergies:  Allergies  Allergen Reactions  . Metformin Other (See Comments)    REACTION: pt states caused liver injury some years ago, currently taking invokamet    Past Medical History  Diagnosis Date  . Diabetes mellitus     Type II dx 1999  . Hyperlipidemia   . Hypertension   . Benign prostatic hypertrophy     s/p TUNA procedure aprx 2006,  . Carpal tunnel syndrome 06/17/2009  . HYPERSOMNIA UNSPECIFIED 12/11/2007  . INSOMNIA-SLEEP DISORDER-UNSPEC 04/12/2010  . Allergic rhinitis   . History of cardiovascular stress test 8/11    Negative  . ED (erectile dysfunction)     After TUNA  . Elevated LFTs 2010    (-)  chronic hep  panel  . Atrial  fibrillation 12/21/2009    paroxysmal. Failed Multaq    Past Surgical History  Procedure Laterality Date  . Vasectomy      several years ago  . Prostate surgery      TUNA  . Cystoscopy  11-09    neg  @ urology, was rx Flomax  . Tee without cardioversion N/A 03/23/2014    Procedure: TRANSESOPHAGEAL ECHOCARDIOGRAM (TEE);  Surgeon: Pricilla Riffle, MD;  [facility] END[facility]COPY;  Service: Cardiovascular;  Laterality: N/A;  . Ablation  03-24-14    PVI by Dr Johney Frame  . Atrial fibrillation ablation N/A 03/24/2014    Procedure: ATRIAL FIBRILLATION ABLATION;  Surgeon: Gardiner Rhyme, MD;  Location: [facility] CATH LAB;  Service: Cardiovascular;  Laterality: N/A;    Family History  Problem Relation Age of Onset  . Heart disease Mother     M-- MI,CHF  . Diabetes Mother   . Hypertension Mother   . Hypertension Father   . Cancer Neg Hx     Negative FH of Colon or Prostate Cancer    Social History:  reports that he quit smoking about 29 years ago. His smoking use included Cigarettes. He has never used smokeless tobacco. He reports that he does not drink alcohol or use illicit drugs.    Review of Systems       Lipids: He has been on low dose simvastatin for a few years, LDL excellent, HDL still low       Lab Results  Component Value Date   CHOL 101 06/08/2014   HDL 30.80* 06/08/2014   LDLCALC 54 06/08/2014   TRIG 80.0 06/08/2014   CHOLHDL 3 06/08/2014               Hypertension: Currently not on any medications except low-dose metoprolol       For several years has a history of Numbness, tingling  in feet and lower legs especially the right side. Relieved by gabapentin. Also has had less symptoms when his blood sugars  improved  Lasix was restarted by cardiologist, taking 20 mg;  no recent edema   LABS:  Appointment on 07/09/2014  Component Date Value Ref Range Status  . Hgb A1c MFr Bld 07/09/2014 5.8  4.6 - 6.5 % Final   Glycemic Control Guidelines for People with Diabetes:Non Diabetic:   <6%Goal of Therapy: <7%Additional Action Suggested:  >8%   . Sodium 07/09/2014 136  135 - 145 mEq/L Final  . Potassium 07/09/2014 3.8  3.5 - 5.1 mEq/L Final  . Chloride 07/09/2014 100  96 - 112 mEq/L Final  . CO2 07/09/2014 30  19 - 32 mEq/L Final  . Glucose, Bld 07/09/2014 157* 70 - 99 mg/dL Final  . BUN 40/98/1191 15  6 - 23 mg/dL Final  . Creatinine, Ser 07/09/2014 1.06  0.40 - 1.50 mg/dL Final  . Total Bilirubin 07/09/2014 0.4  0.2 - 1.2 mg/dL Final  . Alkaline Phosphatase 07/09/2014 92  39 - 117 U/L Final  . AST 07/09/2014 18  0 - 37 U/L Final  . ALT 07/09/2014 21  0 - 53 U/L Final  . Total Protein 07/09/2014 7.4  6.0 - 8.3 g/dL Final  . Albumin 47/82/9562 4.2  3.5 - 5.2 g/dL Final  . Calcium 13/01/6577 9.3  8.4 - 10.5 mg/dL Final  . GFR 46/96/2952 76.95  >60.00 mL/min Final    Physical Examination:  BP 128/71 mmHg  Pulse 85  Temp(Src) 98.3 F (36.8  C)  Resp 14  Ht 5\' 11"  (1.803 m)  Wt 209 lb (94.802 kg)  BMI 29.16 kg/m2  SpO2 96%  No  edema  ASSESSMENT/PLAN:    Diabetes type 2  with obesity  As discussed in history of present illness he has had good control of his diabetes with his losing weight and has been able to stop his mealtime insulin He has been very compliant with his exercise regimen also and is overall doing well However still requiring relatively large dose of basal insulin along with his Invokamet His blood sugars may be relatively higher after certain meals but usually when he does not watch his diet Since his A1c is fairly normal now he can continue the same regimen Discussed fasting blood sugar targets and not to change the Lantus as yet Also he can try to take his Lantus with his evening meal for better compliance  History of hyperlipidemia: Well controlled except for low HDL  Patient Instructions  If sugar in am stays < 90 may reduce Lantus 2-4 units    Destane Speas 07/10/2014, 12:50 PM   Note: This office note was prepared with Conservator, museum/gallery. Any transcriptional errors that result from this process are unintentional. . Addendum: A1c excellent  Lab Results  Component Value Date   HGBA1C 5.8 07/09/2014

## 2014-07-09 NOTE — Patient Instructions (Signed)
If sugar in am stays < 90 may reduce Lantus 2-4 units

## 2014-07-11 NOTE — Progress Notes (Signed)
Quick Note:  Please let patient know that the A1c is further improved at 5.8, excellent. Need to make sure blood sugars stay under 160 after meals ______

## 2014-07-13 ENCOUNTER — Ambulatory Visit: Payer: BC Managed Care – PPO | Admitting: Internal Medicine

## 2014-07-17 ENCOUNTER — Ambulatory Visit: Payer: BC Managed Care – PPO | Admitting: Internal Medicine

## 2014-08-04 ENCOUNTER — Ambulatory Visit: Payer: BC Managed Care – PPO | Admitting: Cardiology

## 2014-08-06 ENCOUNTER — Encounter: Payer: Self-pay | Admitting: Internal Medicine

## 2014-08-06 ENCOUNTER — Ambulatory Visit (INDEPENDENT_AMBULATORY_CARE_PROVIDER_SITE_OTHER): Payer: BC Managed Care – PPO | Admitting: Internal Medicine

## 2014-08-06 VITALS — BP 124/80 | HR 78 | Ht 71.0 in | Wt 209.0 lb

## 2014-08-06 DIAGNOSIS — I10 Essential (primary) hypertension: Secondary | ICD-10-CM

## 2014-08-06 DIAGNOSIS — I4819 Other persistent atrial fibrillation: Secondary | ICD-10-CM

## 2014-08-06 DIAGNOSIS — I481 Persistent atrial fibrillation: Secondary | ICD-10-CM

## 2014-08-06 NOTE — Progress Notes (Signed)
PCP: Danny Ora, MD Primary Cardiologist:  Dr Danny [name] is a 54 y.o. male who presents today for routine electrophysiology followup.  Since his recent afib ablation, the patient reports doing very well.  He denies procedure related complications and is pleased with his results. He has had no further AF. Today, he denies symptoms of palpitations, chest pain, shortness of breath,  lower extremity edema, dizziness, presyncope, or syncope.  The patient is otherwise without complaint today.   Past Medical History  Diagnosis Date  . Diabetes mellitus     Type II dx 1999  . Hyperlipidemia   . Hypertension   . Benign prostatic hypertrophy     s/p TUNA procedure aprx 2006,  . Carpal tunnel syndrome 06/17/2009  . HYPERSOMNIA UNSPECIFIED 12/11/2007  . INSOMNIA-SLEEP DISORDER-UNSPEC 04/12/2010  . Allergic rhinitis   . History of cardiovascular stress test 8/11    Negative  . ED (erectile dysfunction)     After TUNA  . Elevated LFTs 2010    (-) chronic hep  panel  . Atrial fibrillation 12/21/2009    paroxysmal. Failed Multaq   Past Surgical History  Procedure Laterality Date  . Vasectomy      several years ago  . Prostate surgery      TUNA  . Cystoscopy  11-09    neg  @ urology, was rx Flomax  . Tee without cardioversion N/A 03/23/2014    Procedure: TRANSESOPHAGEAL ECHOCARDIOGRAM (TEE);  Surgeon: Danny Riffle, MD;  Location: [facility] ENDOSCOPY;  Service: Cardiovascular;  Laterality: N/A;  . Ablation  03-24-14    PVI by Dr Danny [name]  . Atrial fibrillation ablation N/A 03/24/2014    Procedure: ATRIAL FIBRILLATION ABLATION;  Surgeon: Danny Rhyme, MD;  Location: [facility] CATH LAB;  Service: Cardiovascular;  Laterality: N/A;    ROS- all systems are reviewed and negatives except as per HPI above  Current Outpatient Prescriptions  Medication Sig Dispense Refill  . fish oil-omega-3 fatty acids 1000 MG capsule Take 2 g by mouth 2 (two) times daily.      . fluticasone (FLONASE) 50 MCG/ACT  nasal spray Place 2 sprays into both nostrils daily. 16 g 2  . gabapentin (NEURONTIN) 600 MG tablet Take 1 tablet (600 mg total) by mouth 3 (three) times daily. 90 tablet 6  . glucagon (GLUCAGON EMERGENCY) 1 MG injection Inject 1 mg into the vein once as needed. (Patient taking differently: Inject 1 mg into the vein once as needed (glucagon emergency). ) 1 each 5  . glucose blood (ONETOUCH VERIO) test strip Use as instructed to check blood sugar 4 times per day dx code E11.9 125 each 3  . insulin aspart (NOVOLOG) 100 UNIT/ML injection Inject 5 Units into the skin daily as needed for high blood sugar.     . insulin glargine (LANTUS) 100 UNIT/ML injection Inject 52 Units into the skin at bedtime.     . Insulin Syringe-Needle U-100 (INSULIN SYRINGE .5CC/31GX5/16") 31G X 5/16" 0.5 ML MISC USE ONE SYRINGE AS DIRECTED THREE TO FOUR TIMES DAILY 100 each 3  . INVOKAMET 50-1000 MG TABS TAKE 1 TABLET BY MOUTH TWICE DAILY 60 tablet 5  . loratadine (CLARITIN) 10 MG tablet Take 10 mg by mouth daily as needed for allergies.    Marland Kitchen PRADAXA 150 MG CAPS capsule TAKE 1 CAPSULE BY MOUTH EVERY 12 HOURS 60 capsule 3  . simvastatin (ZOCOR) 20 MG tablet Take 0.5 tablets (10 mg total) by mouth at bedtime. 30 tablet 6  .  tamsulosin (FLOMAX) 0.4 MG CAPS capsule Take 1 capsule (0.4 mg total) by mouth daily. 30 capsule 11   No current facility-administered medications for this visit.    Physical Exam: Filed Vitals:   08/06/14 1635  BP: 124/80  Pulse: 78  Height: 5\' 11"  (1.803 m)  Weight: 209 lb (94.802 kg)    GEN- The patient is well appearing, alert and oriented x 3 today.   Head- normocephalic, atraumatic Eyes-  Sclera clear, conjunctiva pink Ears- hearing intact Oropharynx- clear Lungs- Clear to ausculation bilaterally, normal work of breathing Heart- Regular rate and rhythm, no murmurs, rubs or gallops, PMI not laterally displaced GI- soft, NT, ND, + BS Extremities- no clubbing, cyanosis, or edema  ekg  today reveals sinus rhythm, normal ekg  Assessment and Plan:  1. afib Doing well s/p ablation No recurrent afib post ablation Continue pradaxa for chads2vasc score of at least 2 (DM, HTN) Stop metoprolol  2. Edema- resolved Stop lasix  3. htn Stable No change required today  Return in 3 months

## 2014-08-06 NOTE — Patient Instructions (Signed)
Your physician recommends that you schedule a follow-up appointment in: 3 months with Dr Johney FrameAllred   Your physician has recommended you make the following change in your medication:  1) Stop Furosemide 2) Stop Metoprolol

## 2014-08-20 ENCOUNTER — Other Ambulatory Visit: Payer: Self-pay | Admitting: Internal Medicine

## 2014-09-10 ENCOUNTER — Ambulatory Visit (INDEPENDENT_AMBULATORY_CARE_PROVIDER_SITE_OTHER): Payer: BC Managed Care – PPO | Admitting: Medical

## 2014-09-10 ENCOUNTER — Ambulatory Visit (HOSPITAL_BASED_OUTPATIENT_CLINIC_OR_DEPARTMENT_OTHER)
Admission: RE | Admit: 2014-09-10 | Discharge: 2014-09-10 | Disposition: A | Discharge status: Home / Self Care | Payer: BC Managed Care – PPO | Source: Ambulatory Visit | Attending: Internal Medicine | Admitting: Internal Medicine

## 2014-09-10 ENCOUNTER — Ambulatory Visit: Payer: BC Managed Care – PPO | Admitting: Internal Medicine

## 2014-09-10 ENCOUNTER — Encounter: Payer: Self-pay | Admitting: Medical

## 2014-09-10 VITALS — BP 122/76 | HR 76 | Temp 98.0°F | Ht 71.0 in | Wt 204.8 lb

## 2014-09-10 DIAGNOSIS — J029 Acute pharyngitis, unspecified: Secondary | ICD-10-CM

## 2014-09-10 DIAGNOSIS — M79671 Pain in right foot: Secondary | ICD-10-CM | POA: Insufficient documentation

## 2014-09-10 DIAGNOSIS — M25579 Pain in unspecified ankle and joints of unspecified foot: Secondary | ICD-10-CM

## 2014-09-10 DIAGNOSIS — L089 Local infection of the skin and subcutaneous tissue, unspecified: Secondary | ICD-10-CM

## 2014-09-10 DIAGNOSIS — M25571 Pain in right ankle and joints of right foot: Secondary | ICD-10-CM

## 2014-09-10 DIAGNOSIS — I7090 Unspecified atherosclerosis: Secondary | ICD-10-CM | POA: Insufficient documentation

## 2014-09-10 DIAGNOSIS — M79672 Pain in left foot: Secondary | ICD-10-CM

## 2014-09-10 DIAGNOSIS — M7989 Other specified soft tissue disorders: Secondary | ICD-10-CM | POA: Insufficient documentation

## 2014-09-10 DIAGNOSIS — H9201 Otalgia, right ear: Secondary | ICD-10-CM

## 2014-09-10 LAB — POCT RAPID STREP A (OFFICE): Rapid Strep A Screen: NEGATIVE

## 2014-09-10 MED ORDER — TRAMADOL HCL 50 MG PO TABS: 50.0000 mg | ORAL_TABLET | Freq: Three times a day (TID) | ORAL | Status: DC | PRN

## 2014-09-10 MED ORDER — NEOMYCIN-POLYMYXIN-HC 3.5-10000-1 OT SUSP: 3.0000 [drp] | Freq: Four times a day (QID) | OTIC | Status: DC

## 2014-09-10 MED ORDER — CEPHALEXIN 500 MG PO CAPS: 500.0000 mg | ORAL_CAPSULE | Freq: Two times a day (BID) | ORAL | Status: DC

## 2014-09-10 NOTE — Progress Notes (Signed)
Subjective:    Patient ID: Danny [name], male    DOB: Apr 18, 1961, 54 y.o.   MRN: [id number]  HPI   Pt in with bilateral feet pain and ankle pain. Pain has been for a while. Achy sensation. Worse with walking. But some at rest. Pt takes gabapentin 3 times a day. Pt had this pain for a year in both ankles and feet. Last month pain is worse and more prominent when walking. Rt side foot and ankle hurt worse. Hx of some shingles to rt calf and foot in past(last year).  Pt is taking ibuprofen.  Pt has some soret hroat. Pain in throat for 2 days. Off and on pain. No fever, no chills. No myalgia. No sneezing, no itchy eyes.  Mild left ear pain. On review he is denying any obvious allergy symptoms.He does take flonase daily.  Also rt great toe at base of the nail. Mild red and tender last 2 days.       Review of Systems  Constitutional: Negative for fever, chills and fatigue.  HENT: Positive for ear pain and sore throat. Negative for congestion, hearing loss, nosebleeds, rhinorrhea, sinus pressure and trouble swallowing.   Respiratory: Negative for cough, choking, chest tightness and wheezing.   Cardiovascular: Negative for chest pain and palpitations.  Gastrointestinal: Negative for nausea, abdominal pain, diarrhea, constipation and abdominal distention.  Musculoskeletal:       Ankle feet pain on both sides. RT great toes pain base of nail.  Neurological: Negative for dizziness, syncope, weakness, light-headedness, numbness and headaches.  Hematological: Negative for adenopathy. Does not bruise/bleed easily.  Psychiatric/Behavioral: Negative for behavioral problems and confusion.    Past Medical History  Diagnosis Date  . Diabetes mellitus     Type II dx 1999  . Hyperlipidemia   . Hypertension   . Benign prostatic hypertrophy     s/p TUNA procedure aprx 2006,  . Carpal tunnel syndrome 06/17/2009  . HYPERSOMNIA UNSPECIFIED 12/11/2007  . INSOMNIA-SLEEP DISORDER-UNSPEC  04/12/2010  . Allergic rhinitis   . History of cardiovascular stress test 8/11    Negative  . ED (erectile dysfunction)     After TUNA  . Elevated LFTs 2010    (-) chronic hep  panel  . Atrial fibrillation 12/21/2009    paroxysmal. Failed Multaq    History   Social History  . Marital Status: Married    Spouse Name: N/A  . Number of Children: 2  . Years of Education: N/A   Occupational History  . Works for [employer] (state[place]  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 Smoking status: Former Smoker    Types: Cigarettes    Quit date: 06/26/1985  . Smokeless tobacco: Never Used  . Alcohol Use: No     Comment:    . Drug Use: No  . Sexual Activity: Not on file   Other Topics Concern  . Not on file   Social History Narrative   has a girlfriend, [place]             Past Surgical History  Procedure Laterality Date  . Vasectomy      several years ago  . Prostate surgery      TUNA  . Cystoscopy  11-09    neg  @ urology, was rx Flomax  . Tee without cardioversion N/A 03/23/2014    Procedure: TRANSESOPHAGEAL ECHOCARDIOGRAM (TEE);  Surgeon: Pricilla RifflePaula Ross V,  MD;  Location: [facility] ENDOSCOPY;  Service: Cardiovascular;  Laterality: N/A;  . Ablation  03-24-14    PVI by Dr Johney Frame  . Atrial fibrillation ablation N/A 03/24/2014    Procedure: ATRIAL FIBRILLATION ABLATION;  Surgeon: Gardiner Rhyme, MD;  Location: [facility] CATH LAB;  Service: Cardiovascular;  Laterality: N/A;    Family History  Problem Relation Age of Onset  . Heart disease Mother     M-- MI,CHF  . Diabetes Mother   . Hypertension Mother   . Hypertension Father   . Cancer Neg Hx     Negative FH of Colon or Prostate Cancer    No Known Allergies  Current Outpatient Prescriptions on File Prior to Visit  Medication Sig Dispense Refill  . fish oil-omega-3 fatty acids 1000 MG capsule Take 2 g by mouth 2 (two) times daily.      . fluticasone (FLONASE) 50 MCG/ACT nasal spray  INSTILL 2 SPRAYS IN EACH NOSTRIL DAILY 16 g 3  . gabapentin (NEURONTIN) 600 MG tablet Take 1 tablet (600 mg total) by mouth 3 (three) times daily. 90 tablet 6  . glucagon (GLUCAGON EMERGENCY) 1 MG injection Inject 1 mg into the vein once as needed. (Patient taking differently: Inject 1 mg into the vein once as needed (glucagon emergency). ) 1 each 5  . glucose blood (ONETOUCH VERIO) test strip Use as instructed to check blood sugar 4 times per day dx code E11.9 125 each 3  . insulin aspart (NOVOLOG) 100 UNIT/ML injection Inject 5 Units into the skin daily as needed for high blood sugar.     . insulin glargine (LANTUS) 100 UNIT/ML injection Inject 52 Units into the skin at bedtime.     . Insulin Syringe-Needle U-100 (INSULIN SYRINGE .5CC/31GX5/16") 31G X 5/16" 0.5 ML MISC USE ONE SYRINGE AS DIRECTED THREE TO FOUR TIMES DAILY 100 each 3  . INVOKAMET 50-1000 MG TABS TAKE 1 TABLET BY MOUTH TWICE DAILY 60 tablet 5  . loratadine (CLARITIN) 10 MG tablet Take 10 mg by mouth daily as needed for allergies.    Marland Kitchen PRADAXA 150 MG CAPS capsule TAKE 1 CAPSULE BY MOUTH EVERY 12 HOURS 60 capsule 3  . simvastatin (ZOCOR) 20 MG tablet Take 0.5 tablets (10 mg total) by mouth at bedtime. 30 tablet 6  . tamsulosin (FLOMAX) 0.4 MG CAPS capsule Take 1 capsule (0.4 mg total) by mouth daily. 30 capsule 11   No current facility-administered medications on file prior to visit.    BP 122/76 mmHg  Pulse 76  Temp(Src) 98 F (36.7 C) (Oral)  Ht  (1.803 m)  Wt 204 lb 12.8 oz (92.897 kg)  BMI 28.58 kg/m2  SpO2 96%      Objective:   Physical Exam   General  Mental Status - Alert. General Appearance - Well groomed. Not in acute distress.  Skin Rashes- No Rashes.  HEENT Head- Normal. Ear Auditory Canal - Left- old scarring not red. Right - Normal.Tympanic Membrane- Left- faint swelling of canal.. Right- Normal. Eye Sclera/Conjunctiva- Left- Normal. Right- Normal. Nose & Sinuses Nasal Mucosa- Left-   Mild  Boggy and Congested. Right-  Mild Boggy and  Congested. No Bilateral maxillary and no frontal sinus pressure. Mouth & Throat Lips: Upper Lip- Normal: no dryness, cracking, pallor, cyanosis, or vesicular eruption. Lower Lip-Normal: no dryness, cracking, pallor, cyanosis or vesicular eruption. Buccal Mucosa- Bilateral- No Aphthous ulcers. Oropharynx- No Discharge or Erythema. Tonsils: Characteristics- Bilateral- Moderate Erythema or Congestion(uvual looks mild enlarged. Size/Enlargement- Bilateral- 1+  enlargement. Discharge- bilateral-None.  Neck Neck- Supple. No Masses.no lymphadnopathy.   Chest and Lung Exam Auscultation: Breath Sounds:-Clear even and unlabored.  Cardiovascular Auscultation:Rythm- Regular, rate and rhythm. Murmurs & Other Heart Sounds:Ausculatation of the heart reveal- No Murmurs.  Lymphatic Head & Neck General Head & Neck Lymphatics: Bilateral: Description- No Localized lymphadenopathy.  Pharynx red. Lt tm scar. Rt toe base of nail red and tender.  Lower ext- no pedal edema bilaterally,no homans signs bilaterally. Feet- no redness, no warmth and not tenderness. On palpation no obvious tenderness.(only tender at base of right toe nail). Small red and very tender area.  Ankles- not swollen and not tender to palpation.      Assessment & Plan:

## 2014-09-10 NOTE — Assessment & Plan Note (Signed)
Mild pain but on exam moderate bright red. Rapid strep done today.  Decided to treat with cephalexin. This would treat strep.  Also this would treat the likely early infection at base of rt great toe nail.

## 2014-09-10 NOTE — Assessment & Plan Note (Signed)
Minimal early at base of rt toenail. Rx cephalexin should help with this.

## 2014-09-10 NOTE — Patient Instructions (Addendum)
Pharyngitis Mild pain but on exam moderate bright red. Rapid strep done today.  Decided to treat with cephalexin. This would treat strep.  Also this would treat the likely early infection at base of rt great toe nail.   Bilateral foot pain May be diabetic neuropathy both side and some rt sided nerve pain post shingles by your hx. You are already on neurontin moderate high dose.  Will rx brief course of tramadol for pain. But also get xray of rt foot and rt ankle. Advise alleve otc instead of ibuprofen.  Xray to assess if degnerative joint disease or other conditions such as spurrs that could contribute to pain.   Pain in right ear TM  looks clear. Hx of infections by exam.(canal looks possible mild swollen) Will rx cortisporin otic ear drops see if this helps.    Skin infection Minimal early at base of rt toenail. Rx cephalexin should help with this.     Follow up 7-10 days any persisting pain or as needed.

## 2014-09-10 NOTE — Progress Notes (Signed)
Pre visit review using our clinic review tool, if applicable. No additional management support is needed unless otherwise documented below in the visit note. 

## 2014-09-10 NOTE — Assessment & Plan Note (Signed)
TM  looks clear. Hx of infections by exam.(canal looks possible mild swollen) Will rx cortisporin otic ear drops see if this helps.

## 2014-09-10 NOTE — Assessment & Plan Note (Signed)
May be diabetic neuropathy both side and some rt sided nerve pain post shingles by your hx. You are already on neurontin moderate high dose.  Will rx brief course of tramadol for pain. But also get xray of rt foot and rt ankle. Advise alleve otc instead of ibuprofen.  Xray to assess if degnerative joint disease or other conditions such as spurrs that could contribute to pain.

## 2014-09-11 ENCOUNTER — Telehealth: Payer: Self-pay | Admitting: Medical

## 2014-09-11 DIAGNOSIS — M25571 Pain in right ankle and joints of right foot: Secondary | ICD-10-CM

## 2014-09-11 NOTE — Telephone Encounter (Signed)
I left message stating want to review xrays with him and am considering referring to podiatrist. Will wait for him to call back and discuss xrays with him.

## 2014-09-12 NOTE — Telephone Encounter (Signed)
I called pt yesterday(reviewed xrays) and explained that will refer to podiatrist. Note on exam the other day pt did not have  ulcer. Although on xrays radiologist made comments about possible ulcer? When I talked with pt yesterday I confirmed with him that was not the case.

## 2014-09-17 ENCOUNTER — Telehealth: Payer: Self-pay

## 2014-09-17 MED ORDER — GABAPENTIN 600 MG PO TABS: 600.0000 mg | ORAL_TABLET | Freq: Three times a day (TID) | ORAL | Status: DC

## 2014-09-17 NOTE — Telephone Encounter (Signed)
Yes x 1 year 

## 2014-09-17 NOTE — Telephone Encounter (Signed)
Pt is requesting refill on Gabapentin.  Last OV: 06/05/2014 Last Fill: 02/03/2014 #[id number]  Please advise.

## 2014-09-17 NOTE — Telephone Encounter (Signed)
Rx refilled to [facility] x 1 year.

## 2014-09-30 ENCOUNTER — Other Ambulatory Visit: Payer: Self-pay

## 2014-10-08 ENCOUNTER — Ambulatory Visit (INDEPENDENT_AMBULATORY_CARE_PROVIDER_SITE_OTHER): Payer: BC Managed Care – PPO | Admitting: Endocrinology

## 2014-10-08 ENCOUNTER — Encounter: Payer: Self-pay | Admitting: Endocrinology

## 2014-10-08 VITALS — BP 122/80 | HR 84 | Temp 98.1°F | Wt 210.0 lb

## 2014-10-08 DIAGNOSIS — E119 Type 2 diabetes mellitus without complications: Secondary | ICD-10-CM | POA: Diagnosis not present

## 2014-10-08 DIAGNOSIS — Z794 Long term (current) use of insulin: Secondary | ICD-10-CM

## 2014-10-08 LAB — POCT URINALYSIS DIPSTICK
BILIRUBIN UA: NEGATIVE
GLUCOSE UA: 2000
Ketones, UA: NEGATIVE
Leukocytes, UA: NEGATIVE
Nitrite, UA: NEGATIVE
PH UA: 6.5
RBC UA: NEGATIVE
Spec Grav, UA: 1.015
Urobilinogen, UA: NEGATIVE

## 2014-10-08 NOTE — Patient Instructions (Addendum)
Switch Lantus to pm after supper  Check cost of [place] and [place]

## 2014-10-08 NOTE — Progress Notes (Signed)
Patient ID: Danny GreenhouseFred W [name], male   DOB: [birth year], 54 y.o.   MRN: [id number]     Reason for Appointment: Followup for Type 2 Diabetes  Referring physician: Drue NovelPaz  History of Present Illness:          Diagnosis: Type 2 diabetes mellitus, date of diagnosis: 1992       Past history:  He has had long-standing diabetes and previously was treated with metformin, Amaryl, Januvia and Actos He was taken off metformin because of fear of liver problems.  May have been taken taken off Actos because of swelling of his legs but was on 45 mg at that time Over the last 2 years at least his A1c has been consistently over 8% He was started on insulin in 2011 probably when his A1c was over 9% Initially was given Lantus and Humalog and subsequently switched to 70/30 twice a day for unknown reasons Has not taken Byetta or Victoza in the past He had been on 70/30 insulin for the last few years  Recent history:   INSULIN regimen is described as:  NovoLog none; Lantus 52 units at 9 pm  His blood sugars are dramatically better now since his last visit with his losing about 40 pounds of weight He says that he started feeling much better after his atrial ablation and was more active Previously A1c was 8% or more consistently before starting Invokamet. On his last visit  he was having relatively high fasting readings He is generally watching his diet also Because of relatively lower blood sugars he has stopped taking his NovoLog at meals.  He does however have some relatively high readings but not over [id number] after certain meals.  He thinks these are only high he is going off his diet He switched to monitor to the One Touch brand and blood sugars seem to be slightly higher than before on his Accu-Chek He has reduced his Lantus by 4 units; however fasting blood sugars are mildly increased except for today He continues to take Invokamet consistently A1c is in the normal range now       Oral  hypoglycemic drugs the patient is taking are: Invokamet twice a day    Side effects from medications have been: None  Compliance with the medical regimen: Good Hypoglycemia:  recently with lowest blood sugar 76 fasting today and had a low reading of 67 after evening meal in 12/15  Glucose monitoring:  done 3.2 times a day         Glucometer: One Touch.      Blood Glucose readings    PRE-MEAL Breakfast Lunch Dinner Bedtime Overall  Glucose range:  91-159       Median:  121      109    POST-MEAL PC Breakfast PC Lunch PC Dinner  Glucose range:     Mean/median:        PRE-MEAL Breakfast Lunch Dinner  PCS  Overall  Glucose range:  76-144   84-175   92-116   86-193    Mean/median: 112  104   109   138  111   Glycemic control:   Lab Results  Component Value Date   HGBA1C 5.4 10/08/2014   HGBA1C 5.8 07/09/2014   HGBA1C 6.3 04/08/2014   Lab Results  Component Value Date   MICROALBUR <0.7 10/08/2014   LDLCALC 54 06/08/2014   CREATININE 0.90 10/08/2014    Retinal exam: annual    Self-care: The diet that the  patient has been following is: None    Meals: 3 meals per day. Breakfast is eggs or cereal usually,  controlling portions      Dietician visit: Most recent: years ago.    Exercise:  he is active with moving equipment at work and some walking, does drive a truck                       Weight history:  Wt Readings from Last 3 Encounters:  10/08/14 210 lb (95.255 kg)  09/10/14 204 lb 12.8 oz (92.897 kg)  08/06/14 209 lb (94.802 kg)       Medication List       This list is accurate as of: 10/08/14 11:59 PM.  Always use your most recent med list.               fish oil-omega-3 fatty acids 1000 MG capsule  Take 2 g by mouth 2 (two) times daily.     fluticasone 50 MCG/ACT nasal spray  Commonly known as:  FLONASE  INSTILL 2 SPRAYS IN EACH NOSTRIL DAILY     gabapentin 600 MG tablet  Commonly known as:  NEURONTIN  Take 1 tablet (600 mg total) by mouth 3 (three)  times daily.     glucagon 1 MG injection  Commonly known as:  GLUCAGON EMERGENCY  Inject 1 mg into the vein once as needed.     glucose blood test strip  Commonly known as:  ONETOUCH VERIO  Use as instructed to check blood sugar 4 times per day dx code E11.9     insulin aspart 100 UNIT/ML injection  Commonly known as:  novoLOG  Inject 5 Units into the skin daily as needed for high blood sugar.     insulin glargine 100 UNIT/ML injection  Commonly known as:  LANTUS  Inject 52 Units into the skin at bedtime.     INSULIN SYRINGE .5CC/31GX5/16" 31G X 5/16" 0.5 ML Misc  USE ONE SYRINGE AS DIRECTED THREE TO FOUR TIMES DAILY     INVOKAMET 50-1000 MG Tabs  Generic drug:  Canagliflozin-Metformin HCl  TAKE 1 TABLET BY MOUTH TWICE DAILY     loratadine 10 MG tablet  Commonly known as:  CLARITIN  Take 10 mg by mouth daily as needed for allergies.     METANX 3-90.314-2-35 MG Caps     neomycin-polymyxin-hydrocortisone 3.5-10000-1 otic suspension  Commonly known as:  CORTISPORIN  Place 3 drops into the left ear 4 (four) times daily.     PRADAXA 150 MG Caps capsule  Generic drug:  dabigatran  TAKE 1 CAPSULE BY MOUTH EVERY 12 HOURS     simvastatin 20 MG tablet  Commonly known as:  ZOCOR  Take 0.5 tablets (10 mg total) by mouth at bedtime.     tamsulosin 0.4 MG Caps capsule  Commonly known as:  FLOMAX  Take 1 capsule (0.4 mg total) by mouth daily.     traMADol 50 MG tablet  Commonly known as:  ULTRAM  Take 1 tablet (50 mg total) by mouth every 8 (eight) hours as needed.        Allergies:  No Known Allergies  Past Medical History  Diagnosis Date  . Diabetes mellitus     Type II dx 1999  . Hyperlipidemia   . Hypertension   . Benign prostatic hypertrophy     s/p TUNA procedure aprx 2006,  . Carpal tunnel syndrome 06/17/2009  . HYPERSOMNIA UNSPECIFIED 12/11/2007  . INSOMNIA-SLEEP  DISORDER-UNSPEC 04/12/2010  . Allergic rhinitis   . History of cardiovascular stress test 8/11     Negative  . ED (erectile dysfunction)     After TUNA  . Elevated LFTs 2010    (-) chronic hep  panel  . Atrial fibrillation 12/21/2009    paroxysmal. Failed Multaq    Past Surgical History  Procedure Laterality Date  . Vasectomy      several years ago  . Prostate surgery      TUNA  . Cystoscopy  11-09    neg  @ urology, was rx Flomax  . Tee without cardioversion N/A 03/23/2014    Procedure: TRANSESOPHAGEAL ECHOCARDIOGRAM (TEE);  Surgeon: Pricilla Riffle, MD;  Location: [facility] ENDOSCOPY;  Service: Cardiovascular;  Laterality: N/A;  . Ablation  03-24-14    PVI by Dr Johney Frame  . Atrial fibrillation ablation N/A 03/24/2014    Procedure: ATRIAL FIBRILLATION ABLATION;  Surgeon: Gardiner Rhyme, MD;  Location: [facility] CATH LAB;  Service: Cardiovascular;  Laterality: N/A;    Family History  Problem Relation Age of Onset  . Heart disease Mother     M-- MI,CHF  . Diabetes Mother   . Hypertension Mother   . Hypertension Father   . Cancer Neg Hx     Negative FH of Colon or Prostate Cancer    Social History:  reports that he quit smoking about 29 years ago. His smoking use included Cigarettes. He has never used smokeless tobacco. He reports that he does not drink alcohol or use illicit drugs.    Review of Systems       Lipids: He has been on low dose simvastatin for a few years, LDL excellent, HDL still low       Lab Results  Component Value Date   CHOL 101 06/08/2014   HDL 30.80* 06/08/2014   LDLCALC 54 06/08/2014   TRIG 80.0 06/08/2014   CHOLHDL 3 06/08/2014               Hypertension: Currently not on any medications except low-dose metoprolol       For several years has a history of Numbness, tingling  in feet and lower legs especially the right side. Relieved by gabapentin. Also has had less symptoms when his blood sugars  improved  Lasix was restarted by cardiologist, taking 20 mg;  no recent edema   LABS:  Office Visit on 10/08/2014  Component Date Value Ref Range Status    . Hgb A1c MFr Bld 10/08/2014 5.4  4.6 - 6.5 % Final   Glycemic Control Guidelines for People with Diabetes:Non Diabetic:  <6%Goal of Therapy: <7%Additional Action Suggested:  >8%   . Sodium 10/08/2014 137  135 - 145 mEq/L Final  . Potassium 10/08/2014 4.2  3.5 - 5.1 mEq/L Final  . Chloride 10/08/2014 103  96 - 112 mEq/L Final  . CO2 10/08/2014 27  19 - 32 mEq/L Final  . Glucose, Bld 10/08/2014 119* 70 - 99 mg/dL Final  . BUN 16/03/9603 16  6 - 23 mg/dL Final  . Creatinine, Ser 10/08/2014 0.90  0.40 - 1.50 mg/dL Final  . Total Bilirubin 10/08/2014 0.3  0.2 - 1.2 mg/dL Final  . Alkaline Phosphatase 10/08/2014 89  39 - 117 U/L Final  . AST 10/08/2014 17  0 - 37 U/L Final  . ALT 10/08/2014 21  0 - 53 U/L Final  . Total Protein 10/08/2014 7.2  6.0 - 8.3 g/dL Final  . Albumin 54/02/8118 4.0  3.5 -  5.2 g/dL Final  . Calcium 40/98/1191 9.7  8.4 - 10.5 mg/dL Final  . GFR 47/82/9562 92.86  >60.00 mL/min Final  . Microalb, Ur 10/08/2014 <0.7  0.0 - 1.9 mg/dL Final  . Creatinine,U 13/01/6577 91.7   Final  . Microalb Creat Ratio 10/08/2014 0.8  0.0 - 30.0 mg/g Final  . Color, UA 10/08/2014 Yellow   Final  . Clarity, UA 10/08/2014 Clear   Final  . Glucose, UA 10/08/2014 2000   Final  . Bilirubin, UA 10/08/2014 Neg   Final  . Ketones, UA 10/08/2014 Neg   Final  . Spec Grav, UA 10/08/2014 1.015   Final  . Blood, UA 10/08/2014 Neg   Final  . pH, UA 10/08/2014 6.5   Final  . Protein, UA 10/08/2014 Trace   Final  . Urobilinogen, UA 10/08/2014 negative   Final  . Nitrite, UA 10/08/2014 Neg   Final  . Leukocytes, UA 10/08/2014 Negative   Final    Physical Examination:  BP 122/80 mmHg  Pulse 84  Temp(Src) 98.1 F (36.7 C) (Oral)  Wt 210 lb (95.255 kg)  SpO2 93%  No  edema  ASSESSMENT/PLAN:    Diabetes type 2  with obesity  As discussed in history of present illness he has had good control of his diabetes with his losing weight and has been able to stop his mealtime insulin He has been  very compliant with his exercise regimen also and is overall doing well However still requiring relatively large dose of basal insulin along with his Invokamet His blood sugars may be relatively higher after certain meals but usually when he does not watch his diet Since his A1c is fairly normal now he can continue the same regimen Discussed fasting blood sugar targets and not to change the Lantus as yet Also he can try to take his Lantus with his evening meal for better compliance  History of hyperlipidemia: Well controlled except for low HDL  Patient Instructions  Switch Lantus to pm after supper  Check cost of Toujeo and Tresiba     [facility] 10/09/2014, 1:11 PM   Note: This office note was prepared with Insurance underwriter. Any transcriptional errors that result from this process are unintentional. . Addendum: A1c    Lab Results  Component Value Date   HGBA1C 5.4 10/08/2014

## 2014-10-09 LAB — MICROALBUMIN / CREATININE URINE RATIO
CREATININE, U: 91.7 mg/dL
MICROALB/CREAT RATIO: 0.8 mg/g (ref 0.0–30.0)

## 2014-10-09 LAB — COMPREHENSIVE METABOLIC PANEL
ALK PHOS: 89 U/L (ref 39–117)
ALT: 21 U/L (ref 0–53)
AST: 17 U/L (ref 0–37)
Albumin: 4 g/dL (ref 3.5–5.2)
BILIRUBIN TOTAL: 0.3 mg/dL (ref 0.2–1.2)
BUN: 16 mg/dL (ref 6–23)
CO2: 27 meq/L (ref 19–32)
Calcium: 9.7 mg/dL (ref 8.4–10.5)
Chloride: 103 mEq/L (ref 96–112)
Creatinine, Ser: 0.9 mg/dL (ref 0.40–1.50)
GFR: 92.86 mL/min (ref >60.00)
GLUCOSE: 119 mg/dL — AB (ref 70–99)
Potassium: 4.2 mEq/L (ref 3.5–5.1)
Sodium: 137 mEq/L (ref 135–145)
Total Protein: 7.2 g/dL (ref 6.0–8.3)

## 2014-10-09 LAB — HEMOGLOBIN A1C: Hgb A1c MFr Bld: 5.4 % (ref 4.6–6.5)

## 2014-10-15 ENCOUNTER — Ambulatory Visit (INDEPENDENT_AMBULATORY_CARE_PROVIDER_SITE_OTHER): Payer: Self-pay | Admitting: Neurology

## 2014-10-15 ENCOUNTER — Encounter: Payer: Self-pay | Admitting: Neurology

## 2014-10-15 ENCOUNTER — Ambulatory Visit (INDEPENDENT_AMBULATORY_CARE_PROVIDER_SITE_OTHER): Payer: BC Managed Care – PPO | Admitting: Neurology

## 2014-10-15 DIAGNOSIS — R202 Paresthesia of skin: Secondary | ICD-10-CM | POA: Diagnosis not present

## 2014-10-15 DIAGNOSIS — E1142 Type 2 diabetes mellitus with diabetic polyneuropathy: Secondary | ICD-10-CM

## 2014-10-15 DIAGNOSIS — G629 Polyneuropathy, unspecified: Secondary | ICD-10-CM

## 2014-10-15 HISTORY — DX: Type 2 diabetes mellitus with diabetic polyneuropathy: E11.42

## 2014-10-15 NOTE — Progress Notes (Signed)
Please refer to EMG and nerve conduction study procedure note. 

## 2014-10-15 NOTE — Procedures (Signed)
     HISTORY:  Danny LedgerFred [name] is a 54 year old gentleman with a history of well-controlled diabetes. The patient has had onset over the last 1-2 years of paresthesias involving the feet associated with discomfort. The patient has some slight low back pain without radiation down either leg. He denies any weakness of the legs. He is being evaluated for a possible peripheral neuropathy.  NERVE CONDUCTION STUDIES:  Nerve conduction studies were performed on the left upper extremity. The distal motor latencies and motor amplitudes for the median and ulnar nerves were within normal limits. The F wave latencies and nerve conduction velocities for these nerves were also normal. The sensory latencies for the median and ulnar nerves were normal.  Nerve conduction studies were performed on both lower extremities. The study of the right peroneal nerve was unobtainable. The distal motor latency for the left peroneal nerve was normal, with a normal motor amplitude. The distal motor latencies and motor amplitudes for the posterior tibial nerves were normal bilaterally. Slowing was seen for the left peroneal nerve and for the posterior tibial nerves bilaterally. The F wave latencies for the posterior tibial nerves were prolonged bilaterally, and were also prolonged for the left peroneal nerve. The peroneal sensory latencies were unobtainable bilaterally.  EMG STUDIES:  EMG study was performed on the left lower extremity:  The tibialis anterior muscle reveals 2 to 4K motor units with full recruitment. No fibrillations or positive waves were seen. The peroneus tertius muscle reveals 2 to 4K motor units with full recruitment. No fibrillations or positive waves were seen. The medial gastrocnemius muscle reveals 1 to 3K motor units with full recruitment. No fibrillations or positive waves were seen. The vastus lateralis muscle reveals 2 to 4K motor units with full recruitment. No fibrillations or positive waves were  seen. The iliopsoas muscle reveals 2 to 4K motor units with full recruitment. No fibrillations or positive waves were seen. The biceps femoris muscle (long head) reveals 2 to 4K motor units with full recruitment. No fibrillations or positive waves were seen. The lumbosacral paraspinal muscles were tested at 3 levels, and revealed no abnormalities of insertional activity at all 3 levels tested. There was good relaxation.   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 There appears to be evidence of dysfunction involving the right peroneal nerve, likely distal in nature. EMG evaluation of the left lower extremity was relatively unremarkable, without evidence of an overlying lumbosacral radiculopathy.  Danny [name]. Danny Yvaine Jankowiak MD 10/15/2014 4:17 PM  [facility] [address] [place], [place] [id number]  Phone [phone] Fax [phone]

## 2014-10-19 ENCOUNTER — Other Ambulatory Visit: Payer: Self-pay

## 2014-10-19 ENCOUNTER — Ambulatory Visit (INDEPENDENT_AMBULATORY_CARE_PROVIDER_SITE_OTHER): Payer: BC Managed Care – PPO | Admitting: Internal Medicine

## 2014-10-19 ENCOUNTER — Encounter: Payer: Self-pay | Admitting: Internal Medicine

## 2014-10-19 VITALS — BP 134/84 | HR 74 | Temp 97.6°F | Ht 71.0 in | Wt 210.5 lb

## 2014-10-19 DIAGNOSIS — H9221 Otorrhagia, right ear: Secondary | ICD-10-CM | POA: Diagnosis not present

## 2014-10-19 NOTE — Progress Notes (Signed)
Pre visit review using our clinic review tool, if applicable. No additional management support is needed unless otherwise documented below in the visit note. 

## 2014-10-19 NOTE — Patient Instructions (Signed)
Take Claritin and your nasal spray daily If you're not back to normal in 10 days, please let us know

## 2014-10-19 NOTE — Progress Notes (Signed)
Subjective:    Patient ID: Danny [name], male    DOB: 1961/05/19, 54 y.o.   MRN: [id number]  DOS:  10/19/2014 Type of visit - description : acute Interval history: Symptoms started 3 or 4 days ago with runny nose, yesterday he blew his nose and felt right ear pain, today he saw  few drops of red blood coming out of the R ear. Pain is minimal at this point   Review of Systems Denies fever chills, no sinus pain or congestion, + clear nasal discharge. Denies sputum production, cough or chest congestion  Past Medical History  Diagnosis Date  . Diabetes mellitus     Type II dx 1999  . Hyperlipidemia   . Hypertension   . Benign prostatic hypertrophy     s/p TUNA procedure aprx 2006,  . Carpal tunnel syndrome 06/17/2009  . HYPERSOMNIA UNSPECIFIED 12/11/2007  . INSOMNIA-SLEEP DISORDER-UNSPEC 04/12/2010  . Allergic rhinitis   . History of cardiovascular stress test 8/11    Negative  . ED (erectile dysfunction)     After TUNA  . Elevated LFTs 2010    (-) chronic hep  panel  . Atrial fibrillation 12/21/2009    paroxysmal. Failed Multaq  . DM type 2 with diabetic peripheral neuropathy 10/15/2014    Past Surgical History  Procedure Laterality Date  . Vasectomy      several years ago  . Prostate surgery      TUNA  . Cystoscopy  11-09    neg  @ urology, was rx Flomax  . Tee without cardioversion N/A 03/23/2014    Procedure: TRANSESOPHAGEAL ECHOCARDIOGRAM (TEE);  Surgeon: Pricilla Riffle, MD;  Location: [facility] ENDOSCOPY;  Service: Cardiovascular;  Laterality: N/A;  . Ablation  03-24-14    PVI by Dr Johney Frame  . Atrial fibrillation ablation N/A 03/24/2014    Procedure: ATRIAL FIBRILLATION ABLATION;  Surgeon: Gardiner Rhyme, MD;  Location: [facility] CATH LAB;  Service: Cardiovascular;  Laterality: N/A;    History   Social History  . Marital Status: Married    Spouse Name: N/A  . Number of Children: 2  . Years of Education: N/A   Occupational History  . Works for [employer] (state[employer]  Drives a  Multimedia programmer     works for [employer] (state)   Social History Main Topics  . Smoking status: Former Smoker    Types: Cigarettes    Quit date: 06/26/1985  . Smokeless tobacco: Never Used  . Alcohol Use: No     Comment:    . Drug Use: No  . Sexual Activity: Not on file   Other Topics Concern  . Not on file   Social History Narrative   has a girlfriend, lives in [place]                 Medication List       This list is accurate as of: 10/19/14 11:28 AM.  Always use your most recent med list.               fish oil-omega-3 fatty acids 1000 MG capsule  Take 2 g by mouth 2 (two) times daily.     fluticasone 50 MCG/ACT nasal spray  Commonly known as:  FLONASE  INSTILL 2 SPRAYS IN EACH NOSTRIL DAILY     gabapentin 600 MG tablet  Commonly known as:  NEURONTIN  Take 1 tablet (600 mg total) by mouth 3 (three) times daily.     glucagon 1 MG injection  Commonly known as:  GLUCAGON EMERGENCY  Inject 1 mg into the vein once as needed.     glucose blood test strip  Commonly known as:  ONETOUCH VERIO  Use as instructed to check blood sugar 4 times per day dx code E11.9     insulin aspart 100 UNIT/ML injection  Commonly known as:  novoLOG  Inject 5 Units into the skin daily as needed for high blood sugar.     insulin glargine 100 UNIT/ML injection  Commonly known as:  LANTUS  Inject 52 Units into the skin at bedtime.     INSULIN SYRINGE .5CC/31GX5/16" 31G X 5/16" 0.5 ML Misc  USE ONE SYRINGE AS DIRECTED THREE TO FOUR TIMES DAILY     INVOKAMET 50-1000 MG Tabs  Generic drug:  Canagliflozin-Metformin HCl  TAKE 1 TABLET BY MOUTH TWICE DAILY     loratadine 10 MG tablet  Commonly known as:  CLARITIN  Take 10 mg by mouth daily as needed for allergies.     METANX 3-90.314-2-35 MG Caps     neomycin-polymyxin-hydrocortisone 3.5-10000-1 otic suspension  Commonly known as:  CORTISPORIN  Place 3 drops into the left ear 4 (four) times daily.     PRADAXA 150 MG Caps capsule   Generic drug:  dabigatran  TAKE 1 CAPSULE BY MOUTH EVERY 12 HOURS     simvastatin 20 MG tablet  Commonly known as:  ZOCOR  Take 0.5 tablets (10 mg total) by mouth at bedtime.     tamsulosin 0.4 MG Caps capsule  Commonly known as:  FLOMAX  Take 1 capsule (0.4 mg total) by mouth daily.     traMADol 50 MG tablet  Commonly known as:  ULTRAM  Take 1 tablet (50 mg total) by mouth every 8 (eight) hours as needed.           Objective:   Physical Exam BP 134/84 mmHg  Pulse 74  Temp(Src) 97.6 F (36.4 C) (Oral)  Ht 5\' 11"  (1.803 m)  Wt 210 lb 8 oz (95.482 kg)  BMI 29.37 kg/m2  SpO2 96%  General:   Well developed, well nourished . NAD.  HEENT:  Normocephalic . Face symmetric, atraumatic Left ear normal Right ear: Tympanic membrane is not red or swollen, he does have a small amount (drops) of red blood in the canal. Canal is otherwise normal Lungs:  CTA B Normal respiratory effort, no intercostal retractions, no accessory muscle use. Heart: RRR,  no murmur.  Muscle skeletal: no pretibial edema bilaterally  Skin: Not pale. Not jaundice Neurologic:  alert & oriented X3.  Speech normal, gait appropriate for age and unassisted Psych--  Cognition and judgment appear intact.  Cooperative with normal attention span and concentration.  Behavior appropriate. No anxious or depressed appearing.       Assessment & Plan:    Ear bleeding, Likely due to barotrauma from blowing his nose, he is anticoagulated, he has likely a URI and no evidence of bacterial infection. Plan: Recommend conservative treatment with Claritin, Flonase, call if not but normal in few days

## 2014-10-29 ENCOUNTER — Other Ambulatory Visit: Payer: Self-pay | Admitting: Endocrinology

## 2014-10-29 ENCOUNTER — Other Ambulatory Visit: Payer: Self-pay | Admitting: Cardiology

## 2014-11-05 LAB — HM DIABETES EYE EXAM

## 2014-11-06 LAB — HM DIABETES EYE EXAM

## 2014-11-09 ENCOUNTER — Encounter: Payer: Self-pay | Admitting: Internal Medicine

## 2014-11-09 ENCOUNTER — Ambulatory Visit (INDEPENDENT_AMBULATORY_CARE_PROVIDER_SITE_OTHER): Payer: BC Managed Care – PPO | Admitting: Internal Medicine

## 2014-11-09 VITALS — BP 130/84 | HR 90 | Ht 71.0 in | Wt 212.0 lb

## 2014-11-09 DIAGNOSIS — I4819 Other persistent atrial fibrillation: Secondary | ICD-10-CM

## 2014-11-09 DIAGNOSIS — I481 Persistent atrial fibrillation: Secondary | ICD-10-CM | POA: Diagnosis not present

## 2014-11-09 DIAGNOSIS — I10 Essential (primary) hypertension: Secondary | ICD-10-CM | POA: Diagnosis not present

## 2014-11-09 NOTE — Patient Instructions (Signed)
Medication Instructions:  Your physician recommends that you continue on your current medications as directed. Please refer to the Current Medication list given to you today.   Labwork: None ordered  Testing/Procedures: None ordered  Follow-Up: Your physician wants you to follow-up in: 6 months with Dr Allred You will receive a reminder letter in the mail two months in advance. If you don't receive a letter, please call our office to schedule the follow-up appointment.     Any Other Special Instructions Will Be Listed Below (If Applicable).   

## 2014-11-09 NOTE — Progress Notes (Signed)
PCP: Willow OraJose Paz, MD Primary Cardiologist:  Dr Neldon Newportrenshaw  Danny [name] is a 54 y.o. male who presents today for routine electrophysiology followup.  Since his last visit, the patient reports doing very well. He has had no further AF. Today, he denies symptoms of palpitations, chest pain, shortness of breath,  lower extremity edema, dizziness, presyncope, or syncope.  The patient is otherwise without complaint today.   Past Medical History  Diagnosis Date  . Diabetes mellitus     Type II dx 1999  . Hyperlipidemia   . Hypertension   . Benign prostatic hypertrophy     s/p TUNA procedure aprx 2006,  . Carpal tunnel syndrome 06/17/2009  . HYPERSOMNIA UNSPECIFIED 12/11/2007  . INSOMNIA-SLEEP DISORDER-UNSPEC 04/12/2010  . Allergic rhinitis   . History of cardiovascular stress test 8/11    Negative  . ED (erectile dysfunction)     After TUNA  . Elevated LFTs 2010    (-) chronic hep  panel  . Atrial fibrillation 12/21/2009    paroxysmal. Failed Multaq  . DM type 2 with diabetic peripheral neuropathy 10/15/2014   Past Surgical History  Procedure Laterality Date  . Vasectomy      several years ago  . Prostate surgery      TUNA  . Cystoscopy  11-09    neg  @ urology, was rx Flomax  . Tee without cardioversion N/A 03/23/2014    Procedure: TRANSESOPHAGEAL ECHOCARDIOGRAM (TEE);  Surgeon: Pricilla RifflePaula Ross V, MD;  [facility];  Service: Cardiovascular;  Laterality: N/A;  . Ablation  03-24-14    PVI by Dr Johney FrameAllred  . Atrial fibrillation ablation N/A 03/24/2014    Procedure: ATRIAL FIBRILLATION ABLATION;  Surgeon: Gardiner RhymeJames D Horald Birky, MD;  Location: [facility] CATH LAB;  Service: Cardiovascular;  Laterality: N/A;    ROS- all systems are reviewed and negatives except as per HPI above  Current Outpatient Prescriptions  Medication Sig Dispense Refill  . fish oil-omega-3 fatty acids 1000 MG capsule Take 2 g by mouth 2 (two) times daily.      . fluticasone (FLONASE) 50 MCG/ACT nasal spray Place 2 sprays  into both nostrils daily as needed for allergies or rhinitis.    Marland Kitchen. gabapentin (NEURONTIN) 600 MG tablet Take 1 tablet (600 mg total) by mouth 3 (three) times daily. 90 tablet 11  . glucagon (GLUCAGON EMERGENCY) 1 MG injection Inject 1 mg into the vein once as needed. (Patient taking differently: Inject 1 mg into the vein once as needed (low blood sugar). ) 1 each 5  . insulin aspart (NOVOLOG) 100 UNIT/ML injection Inject 5 Units into the skin daily as needed for high blood sugar.     . insulin glargine (LANTUS) 100 UNIT/ML injection Inject 52 Units into the skin at bedtime.     . Insulin Syringe-Needle U-100 (INSULIN SYRINGE .5CC/31GX5/16") 31G X 5/16" 0.5 ML MISC USE ONE SYRINGE AS DIRECTED THREE TO FOUR TIMES DAILY 100 each 3  . INVOKAMET 50-1000 MG TABS TAKE 1 TABLET BY MOUTH TWICE DAILY 60 tablet 5  . L-Methylfolate-Algae-B12-B6 (METANX) 3-90.314-2-35 MG CAPS Take 1 capsule by mouth 2 (two) times daily.     Marland Kitchen. loratadine (CLARITIN) 10 MG tablet Take 10 mg by mouth daily.     Letta Pate. ONETOUCH VERIO test strip USE AS DIRECTED TO CHECK BLOOD SUGAR FOUR TIMES DAILY 125 each 3  . PRADAXA 150 MG CAPS capsule TAKE ONE CAPSULE BY MOUTH EVERY 12 HOURS 60 capsule 6  . simvastatin (ZOCOR) 20 MG tablet  Take 0.5 tablets (10 mg total) by mouth at bedtime. 30 tablet 6  . tamsulosin (FLOMAX) 0.4 MG CAPS capsule Take 1 capsule (0.4 mg total) by mouth daily. 30 capsule 11  . traMADol (ULTRAM) 50 MG tablet Take 1 tablet (50 mg total) by mouth every 8 (eight) hours as needed. (Patient taking differently: Take 50 mg by mouth every 8 (eight) hours as needed (pain). ) 15 tablet 0   No current facility-administered medications for this visit.    Physical Exam: Filed Vitals:   11/09/14 1350  BP: 130/84  Pulse: 90  Height: 5\' 11"  (1.803 m)  Weight: 212 lb (96.163 kg)    GEN- The patient is well appearing, alert and oriented x 3 today.   Head- normocephalic, atraumatic Eyes-  Sclera clear, conjunctiva pink Ears-  hearing intact Oropharynx- clear Lungs- Clear to ausculation bilaterally, normal work of breathing Heart- Regular rate and rhythm, no murmurs, rubs or gallops, PMI not laterally displaced GI- soft, NT, ND, + BS Extremities- no clubbing, cyanosis, or edema  ekg today reveals sinus rhythm, nonspecific St/T changes  Assessment and Plan:  1. afib Doing well s/p ablation No recurrent afib post ablation Continue pradaxa for chads2vasc score of at least 2 (DM, HTN)  2.  htn Stable No change required today  Return in 6 months

## 2014-11-13 ENCOUNTER — Telehealth: Payer: Self-pay

## 2014-11-13 ENCOUNTER — Encounter: Payer: Self-pay | Admitting: Endocrinology

## 2014-11-13 NOTE — Telephone Encounter (Signed)
Eye exam abstracted.  Pt has an appt on 02/08/2015 with Dr. Lucianne MussKumar.

## 2014-11-25 ENCOUNTER — Other Ambulatory Visit: Payer: Self-pay | Admitting: Endocrinology

## 2014-12-15 ENCOUNTER — Telehealth: Payer: Self-pay | Admitting: Endocrinology

## 2014-12-15 NOTE — Telephone Encounter (Signed)
[facility] note dated 12/14/14 at 6:29 pm  Initial Comment Caller states that he ran out of medicine, needs doctors authorization.  Nurse Assessment Nurse: Sadie Haber, RN, Hettie Date/Time (Eastern Time): 12/14/2014 6:29:07 PM Confirm and document reason for call. If symptomatic, describe symptoms. ---Caller states that he ran out of medicine, needs doctors authorization. Med is lantus. no refills left. Does not have any left. Takes it every evening. No symptoms. Has the patient traveled out of the country within the last 30 days? ---Not Applicable Does the patient require triage? ---No Nurse: Sadie Haber, RN, Hettie Date/Time (Eastern Time): 12/14/2014 6:31:22 PM Please select the assessment type ---Refill Does the patient have enough medication to last until the office opens? ---Unable to obtain loaner dose from Pharmacy Does the client directives allow for assistance with medications after hours? ---Yes Was the medication filled within the last 6 months? ---Yes What is the name of the medication, dose and instructions as listed on the bottle? ---lantus 52 units every day at bedtime Name of the physician as listed on the bottle. ---[facility] name and phone number where most recently filled. ---[facility] [phone] Additional Documentation ---Spoke with pharmacist, but can only be refilled for one month supply. Per client directive, unable to call in that much. Can only call in enough sufficient until office reopens. Spoke with pt and informed him unable to call in rx. Pt verbalized understanding.

## 2014-12-16 MED ORDER — INSULIN GLARGINE 100 UNIT/ML ~~LOC~~ SOLN: 52.0000 [IU] | Freq: Every day | SUBCUTANEOUS | Status: DC

## 2014-12-23 ENCOUNTER — Ambulatory Visit (HOSPITAL_BASED_OUTPATIENT_CLINIC_OR_DEPARTMENT_OTHER)
Admission: RE | Admit: 2014-12-23 | Discharge: 2014-12-23 | Disposition: A | Discharge status: Home / Self Care | Payer: BC Managed Care – PPO | Source: Ambulatory Visit | Attending: Internal Medicine | Admitting: Internal Medicine

## 2014-12-23 ENCOUNTER — Encounter: Payer: Self-pay | Admitting: Internal Medicine

## 2014-12-23 ENCOUNTER — Ambulatory Visit (INDEPENDENT_AMBULATORY_CARE_PROVIDER_SITE_OTHER): Payer: BC Managed Care – PPO | Admitting: Internal Medicine

## 2014-12-23 VITALS — BP 118/68 | HR 68 | Temp 97.8°F | Ht 71.0 in | Wt 209.0 lb

## 2014-12-23 DIAGNOSIS — R42 Dizziness and giddiness: Secondary | ICD-10-CM

## 2014-12-23 DIAGNOSIS — R05 Cough: Secondary | ICD-10-CM

## 2014-12-23 DIAGNOSIS — R059 Cough, unspecified: Secondary | ICD-10-CM

## 2014-12-23 MED ORDER — AZELASTINE HCL 0.1 % NA SOLN: 2.0000 | Freq: Every evening | NASAL | Status: DC | PRN

## 2014-12-23 NOTE — Patient Instructions (Signed)
Please to the head CT  Rest, drink plenty of fluids, Tylenol as needed for pain.  If the dizziness come back or the neck pain increases or is not going away soon please let us know  Continue Flonase, add Astelin, a prescription was sent. Your goal should improve soon.

## 2014-12-23 NOTE — Progress Notes (Signed)
Subjective:    Patient ID: Danny [name], male    DOB: [birth year], 54 y.o.   MRN: [id number]  DOS:  12/23/2014 Type of visit - description : acute, here with his girlfriend Interval history: Developed severe dizziness this morning. Symptoms started immediately after he stood up, described as spinning, couldn't walk, had to hold onto things. Symptoms decrease with rest and increased whenever he is stood up or tried to walk. After 3 hours, symptoms subsided. His  girlfriend is a Engineer, civil (consulting), she took his vital signs and they were okay.  Also, this morning he woke up with a steady, mild neck pain, posterior, bilateral, no radiation. Denies any headache per se. Somewhat worse with head motion.  Also, 3 weeks history of off, cough, sometimes with a dark or clear sputum.   Review of Systems  Denies fever, chills, runny nose or sore throatIn, + itchy eyes, no postnasal dripping. No chest pain or palpitations. No symptoms consistent with previous episodes of atrial fibrillation. No recent head injury No diplopia, slurred speech or motor deficit. No upper or lower extremity paresthesias.  Past Medical History  Diagnosis Date  . Diabetes mellitus     Type II dx 1999  . Hyperlipidemia   . Hypertension   . Benign prostatic hypertrophy     s/p TUNA procedure aprx 2006,  . Carpal tunnel syndrome 06/17/2009  . HYPERSOMNIA UNSPECIFIED 12/11/2007  . INSOMNIA-SLEEP DISORDER-UNSPEC 04/12/2010  . Allergic rhinitis   . History of cardiovascular stress test 8/11    Negative  . ED (erectile dysfunction)     After TUNA  . Elevated LFTs 2010    (-) chronic hep  panel  . Atrial fibrillation 12/21/2009    paroxysmal. Failed Multaq  . DM type 2 with diabetic peripheral neuropathy 10/15/2014    Past Surgical History  Procedure Laterality Date  . Vasectomy      several years ago  . Prostate surgery      TUNA  . Cystoscopy  11-09    neg  @ urology, was rx Flomax  . Tee without cardioversion N/A  03/23/2014    Procedure: TRANSESOPHAGEAL ECHOCARDIOGRAM (TEE);  Surgeon: Pricilla Riffle, MD;  Location: [facility] ENDOSCOPY;  Service: Cardiovascular;  Laterality: N/A;  . Ablation  03-24-14    PVI by Dr Johney Frame  . Atrial fibrillation ablation N/A 03/24/2014    Procedure: ATRIAL FIBRILLATION ABLATION;  Surgeon: Gardiner Rhyme, MD;  Location: [facility] CATH LAB;  Service: Cardiovascular;  Laterality: N/A;    History   Social History  . Marital Status: Married    Spouse Name: N/A  . Number of Children: 2  . Years of Education: N/A   Occupational History  . 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 Smoking status: Former Smoker    Types: Cigarettes    Quit date: 06/26/1985  . Smokeless tobacco: Never Used  . Alcohol Use: No     Comment:    . Drug Use: No  . Sexual Activity: Not on file   Other Topics Concern  . Not on file   Social History Narrative   has a girlfriend, lives in [place]                 Medication List       This list is accurate as of: 12/23/14 11:59 PM.  Always use your most recent med list.  azelastine 0.1 % nasal spray  Commonly known as:  ASTELIN  Place 2 sprays into both nostrils at bedtime as needed for rhinitis. Use in each nostril as directed     fish oil-omega-3 fatty acids 1000 MG capsule  Take 2 g by mouth 2 (two) times daily.     fluticasone 50 MCG/ACT nasal spray  Commonly known as:  FLONASE  Place 2 sprays into both nostrils daily as needed for allergies or rhinitis.     gabapentin 600 MG tablet  Commonly known as:  NEURONTIN  Take 1 tablet (600 mg total) by mouth 3 (three) times daily.     glucagon 1 MG injection  Commonly known as:  GLUCAGON EMERGENCY  Inject 1 mg into the vein once as needed.     insulin aspart 100 UNIT/ML injection  Commonly known as:  novoLOG  Inject 5 Units into the skin daily as needed for high blood sugar.     insulin glargine 100  UNIT/ML injection  Commonly known as:  LANTUS  Inject 0.52 mLs (52 Units total) into the skin at bedtime.     INSULIN SYRINGE .5CC/31GX5/16" 31G X 5/16" 0.5 ML Misc  USE ONE SYRINGE AS DIRECTED THREE TO FOUR TIMES DAILY     INVOKAMET 50-1000 MG Tabs  Generic drug:  Canagliflozin-Metformin HCl  TAKE 1 TABLET BY MOUTH TWICE DAILY     loratadine 10 MG tablet  Commonly known as:  CLARITIN  Take 10 mg by mouth daily.     METANX 3-90.314-2-35 MG Caps  Take 1 capsule by mouth 2 (two) times daily.     ONETOUCH VERIO test strip  Generic drug:  glucose blood  USE AS DIRECTED TO CHECK BLOOD SUGAR FOUR TIMES DAILY     PRADAXA 150 MG Caps capsule  Generic drug:  dabigatran  TAKE ONE CAPSULE BY MOUTH EVERY 12 HOURS     simvastatin 20 MG tablet  Commonly known as:  ZOCOR  Take 0.5 tablets (10 mg total) by mouth at bedtime.     tamsulosin 0.4 MG Caps capsule  Commonly known as:  FLOMAX  Take 1 capsule (0.4 mg total) by mouth daily.     traMADol 50 MG tablet  Commonly known as:  ULTRAM  Take 1 tablet (50 mg total) by mouth every 8 (eight) hours as needed.           Objective:   Physical Exam BP 118/68 mmHg  Pulse 68  Temp(Src) 97.8 F (36.6 C) (Oral)  Ht 5\' 11"  (1.803 m)  Wt 209 lb (94.802 kg)  BMI 29.16 kg/m2  SpO2 96% General:   Well developed, well nourished . NAD.  HEENT:  Normocephalic . Face symmetric, atraumatic. TMs no red bulge Nose is slightly congested Neck: No TTP, range of motion seems normal Lungs:  CTA B Normal respiratory effort, no intercostal retractions, no accessory muscle use. Heart: RRR,  no murmur.  no pretibial edema bilaterally  Skin: Not pale. Not jaundice Neurologic:  alert & oriented X3.  Speech normal, gait appropriate for age and unassisted DTRs symmetric EOMI, pupils equal and reactive Romberg absent Psych--  Cognition and judgment appear intact.  Cooperative with normal attention span and concentration.  Behavior  appropriate. No anxious or depressed appearing.      Assessment & Plan:    Dizziness Severe episode of dizziness, by description it seems to be a peripheral issue. He also complained of neck pain, he is anticoagulated wich raises the question of ICB. Plan:  CT today Rest, fluids. Call if symptoms resurface. Tylenol  Cough, From allergies (patient complains of itchy eyes) Continue with Flonase, add Astelin, call if not better.

## 2014-12-23 NOTE — Progress Notes (Signed)
Pre visit review using our clinic review tool, if applicable. No additional management support is needed unless otherwise documented below in the visit note. 

## 2015-01-08 ENCOUNTER — Other Ambulatory Visit: Payer: Self-pay | Admitting: Endocrinology

## 2015-02-03 ENCOUNTER — Other Ambulatory Visit: Payer: Self-pay

## 2015-02-04 ENCOUNTER — Encounter: Payer: Self-pay | Admitting: Internal Medicine

## 2015-02-04 ENCOUNTER — Ambulatory Visit (INDEPENDENT_AMBULATORY_CARE_PROVIDER_SITE_OTHER): Payer: BC Managed Care – PPO | Admitting: Internal Medicine

## 2015-02-04 VITALS — BP 126/78 | HR 64 | Temp 98.4°F | Ht 71.0 in | Wt 211.1 lb

## 2015-02-04 DIAGNOSIS — E1142 Type 2 diabetes mellitus with diabetic polyneuropathy: Secondary | ICD-10-CM | POA: Diagnosis not present

## 2015-02-04 DIAGNOSIS — R42 Dizziness and giddiness: Secondary | ICD-10-CM | POA: Diagnosis not present

## 2015-02-04 DIAGNOSIS — L600 Ingrowing nail: Secondary | ICD-10-CM

## 2015-02-04 MED ORDER — CEFUROXIME AXETIL 500 MG PO TABS: 500.0000 mg | ORAL_TABLET | Freq: Two times a day (BID) | ORAL | Status: DC

## 2015-02-04 NOTE — Progress Notes (Signed)
Subjective:    Patient ID: Danny [name], male    DOB: 1960/08/24, 54 y.o.   MRN: [id number]  DOS:  02/04/2015 Type of visit - description :  Routine visit Interval history: In general feeling well, good compliance w/ medication. Blood sugars are excellent, occasionally they go down to 75, he does not have symptoms. Did complain of ingrown toenail for one week, some TTP, no discharge. No fever chills.   Review of Systems No chest pain or difficulty breathing No nausea, vomiting, diarrhea  Past Medical History  Diagnosis Date  . Diabetes mellitus     Type II dx 1999  . Hyperlipidemia   . Hypertension   . Benign prostatic hypertrophy     s/p TUNA procedure aprx 2006,  . Carpal tunnel syndrome 06/17/2009  . HYPERSOMNIA UNSPECIFIED 12/11/2007  . INSOMNIA-SLEEP DISORDER-UNSPEC 04/12/2010  . Allergic rhinitis   . History of cardiovascular stress test 8/11    Negative  . ED (erectile dysfunction)     After TUNA  . Elevated LFTs 2010    (-) chronic hep  panel  . Atrial fibrillation 12/21/2009    paroxysmal. Failed Multaq  . DM type 2 with diabetic peripheral neuropathy 10/15/2014    Past Surgical History  Procedure Laterality Date  . Vasectomy      several years ago  . Prostate surgery      TUNA  . Cystoscopy  11-09    neg  @ urology, was rx Flomax  . Tee without cardioversion N/A 03/23/2014    Procedure: TRANSESOPHAGEAL ECHOCARDIOGRAM (TEE);  Surgeon: Pricilla Riffle, MD;  Location: [facility] ENDOSCOPY;  Service: Cardiovascular;  Laterality: N/A;  . Ablation  03-24-14    PVI by Dr Johney Frame  . Atrial fibrillation ablation N/A 03/24/2014    Procedure: ATRIAL FIBRILLATION ABLATION;  Surgeon: Gardiner Rhyme, MD;  Location: [facility] CATH LAB;  Service: Cardiovascular;  Laterality: N/A;    Social History   Social History  . Marital Status: Married    Spouse Name: N/A  . Number of Children: 2  . Years of Education: N/A   Occupational History  . Works for [employer] (state).  Drives a Teacher, early years/pre     works for [employer] (state)   Social History Main Topics  . Smoking status: Former Smoker    Types: Cigarettes    Quit date: 06/26/1985  . Smokeless tobacco: Never Used  . Alcohol Use: No     Comment:    . Drug Use: No  . Sexual Activity: Not on file   Other Topics Concern  . Not on file   Social History Narrative   has a girlfriend, lives in [place]                 Medication List       This list is accurate as of: 02/04/15  1:59 PM.  Always use your most recent med list.               azelastine 0.1 % nasal spray  Commonly known as:  ASTELIN  Place 2 sprays into both nostrils at bedtime as needed for rhinitis. Use in each nostril as directed     cefUROXime 500 MG tablet  Commonly known as:  CEFTIN  Take 1 tablet (500 mg total) by mouth 2 (two) times daily with a meal.     fish oil-omega-3 fatty acids 1000 MG capsule  Take 2 g by mouth 2 (two) times daily.  fluticasone 50 MCG/ACT nasal spray  Commonly known as:  FLONASE  Place 2 sprays into both nostrils daily as needed for allergies or rhinitis.     gabapentin 600 MG tablet  Commonly known as:  NEURONTIN  Take 1 tablet (600 mg total) by mouth 3 (three) times daily.     glucagon 1 MG injection  Commonly known as:  GLUCAGON EMERGENCY  Inject 1 mg into the vein once as needed.     insulin aspart 100 UNIT/ML injection  Commonly known as:  novoLOG  Inject 5 Units into the skin daily as needed for high blood sugar.     INSULIN SYRINGE .5CC/31GX5/16" 31G X 5/16" 0.5 ML Misc  USE ONE SYRINGE AS DIRECTED THREE TO FOUR TIMES DAILY     INVOKAMET 50-1000 MG Tabs  Generic drug:  Canagliflozin-Metformin HCl  TAKE 1 TABLET BY MOUTH TWICE DAILY     LANTUS SOLOSTAR 100 UNIT/ML Solostar Pen  Generic drug:  Insulin Glargine  INJECT SUBCUTANEOUS 60 UNITS DAILY     loratadine 10 MG tablet  Commonly known as:  CLARITIN  Take 10 mg by mouth daily.     METANX 3-90.314-2-35 MG Caps  Take 1 capsule by  mouth 2 (two) times daily.     ONETOUCH VERIO test strip  Generic drug:  glucose blood  USE AS DIRECTED TO CHECK BLOOD SUGAR FOUR TIMES DAILY     PRADAXA 150 MG Caps capsule  Generic drug:  dabigatran  TAKE ONE CAPSULE BY MOUTH EVERY 12 HOURS     simvastatin 20 MG tablet  Commonly known as:  ZOCOR  Take 0.5 tablets (10 mg total) by mouth at bedtime.     tamsulosin 0.4 MG Caps capsule  Commonly known as:  FLOMAX  Take 1 capsule (0.4 mg total) by mouth daily.     traMADol 50 MG tablet  Commonly known as:  ULTRAM  Take 1 tablet (50 mg total) by mouth every 8 (eight) hours as needed.           Objective:   Physical Exam BP 126/78 mmHg  Pulse 64  Temp(Src) 98.4 F (36.9 C) (Oral)  Ht 5\' 11"  (1.803 m)  Wt 211 lb 2 oz (95.766 kg)  BMI 29.46 kg/m2  SpO2 97%  General:   Well developed, well nourished . NAD.  HEENT:  Normocephalic . Face symmetric, atraumatic Lower extremities:.  No pretibial edema bilaterally  Good pedal pulses bilaterally Right great toe: The proximal-external corner of the toe is slightly swollen, mildly TTP, mild redness but no discharge or warmness. Skin: Not pale. Not jaundice Neurologic:  alert & oriented X3.  Speech normal, gait appropriate for age and unassisted Psych--  Cognition and judgment appear intact.  Cooperative with normal attention span and concentration.  Behavior appropriate. No anxious or depressed appearing.      Assessment & Plan:   Ingrown toenail: Instructed patient about the taped technique to relieve the pressure from the ingrown toenail. Soak toe, topical antibiotics, Ceftin. Definitely call if not improving.  Diabetes: Follow-up by endocrinology, CBGs as low as 75, if low sugars are more frequent or severe strongly recommend to discuss with endocrinology  Dizziness: Was seen with dizziness, CT negative, symptoms resolve.

## 2015-02-04 NOTE — Patient Instructions (Signed)
Take the antibiotics as prescribed  Soak the toe on warm salted water at night, dry the toe, then apply  antibiotics ointment  Use tape to help the ingrown toenail during the daytime

## 2015-02-04 NOTE — Progress Notes (Signed)
Pre visit review using our clinic review tool, if applicable. No additional management support is needed unless otherwise documented below in the visit note. 

## 2015-02-08 ENCOUNTER — Other Ambulatory Visit: Payer: Self-pay | Admitting: *Deleted

## 2015-02-08 ENCOUNTER — Encounter: Payer: Self-pay | Admitting: Endocrinology

## 2015-02-08 ENCOUNTER — Ambulatory Visit (INDEPENDENT_AMBULATORY_CARE_PROVIDER_SITE_OTHER): Payer: BC Managed Care – PPO | Admitting: Endocrinology

## 2015-02-08 VITALS — BP 144/82 | HR 81 | Temp 98.3°F | Resp 16 | Ht 71.0 in | Wt 213.8 lb

## 2015-02-08 DIAGNOSIS — E119 Type 2 diabetes mellitus without complications: Secondary | ICD-10-CM | POA: Diagnosis not present

## 2015-02-08 DIAGNOSIS — E785 Hyperlipidemia, unspecified: Secondary | ICD-10-CM | POA: Diagnosis not present

## 2015-02-08 DIAGNOSIS — G629 Polyneuropathy, unspecified: Secondary | ICD-10-CM

## 2015-02-08 DIAGNOSIS — E1142 Type 2 diabetes mellitus with diabetic polyneuropathy: Secondary | ICD-10-CM | POA: Diagnosis not present

## 2015-02-08 DIAGNOSIS — I10 Essential (primary) hypertension: Secondary | ICD-10-CM | POA: Diagnosis not present

## 2015-02-08 LAB — POCT GLYCOSYLATED HEMOGLOBIN (HGB A1C): Hemoglobin A1C: 5.6

## 2015-02-08 MED ORDER — INSULIN PEN NEEDLE 31G X 8 MM MISC: Status: DC

## 2015-02-08 MED ORDER — INSULIN GLARGINE 100 UNIT/ML SOLOSTAR PEN: PEN_INJECTOR | SUBCUTANEOUS | Status: DC

## 2015-02-08 NOTE — Patient Instructions (Addendum)
Check if [employer] insulin covered

## 2015-02-08 NOTE — Progress Notes (Signed)
Patient ID: Danny [name], male   DOB: [birth year], 54 y.o.   MRN: [id number]     Reason for Appointment: Followup for Type 2 Diabetes  Referring physician: Drue Novel  History of Present Illness:          Diagnosis: Type 2 diabetes mellitus, date of diagnosis: 1992       Past history:  He has had long-standing diabetes and previously was treated with metformin, Amaryl, Januvia and Actos He was taken off metformin because of fear of liver problems.  May have been taken taken off Actos because of swelling of his legs but was on 45 mg at that time Over the last 2 years at least his A1c has been consistently over 8% He was started on insulin in 2011 probably when his A1c was over 9% Initially was given Lantus and Humalog and subsequently switched to 70/30 twice a day for unknown reasons Has not taken Byetta or Victoza in the past He had been on 70/30 insulin initially for a few years Previously A1c was 8% or more consistently before starting Invokamet.  Recent history:   INSULIN regimen is described as:  Lantus 52 units at 9 pm  His blood sugars are continuing to be well controlled with his maintaining his weight loss He says that he started feeling much better after his atrial ablation and was more active He is generally watching his diet also  Currently still taking only Lantus and does not appear to have high postprandial readings unless he is less active but has no readings over 200 His blood sugars are the lowest around lunchtime as he is more active in the mornings, lowest reading 59 Overall average readings are 118 and he is checking blood sugar 3-4 times a day A1c is still upper normal and consistent He continues to take Invokamet without side effects       Oral hypoglycemic drugs the patient is taking are: Invokamet twice a day    Side effects from medications have been: None  Compliance with the medical regimen: Good Hypoglycemia: Only once with glucose 59 at  lunch  Glucose monitoring:  done 3-4 times a day         Glucometer: One Touch.      Blood Glucose readings    Mean values apply above for all meters except median for One Touch  PRE-MEAL Fasting Lunch Dinner  PCS  Overall  Glucose range:  104-146   59-195    80-178    Mean/median:  121   95     115      Glycemic control:   Lab Results  Component Value Date   HGBA1C 5.6 02/08/2015   HGBA1C 5.4 10/08/2014   HGBA1C 5.8 07/09/2014   Lab Results  Component Value Date   MICROALBUR <0.7 10/08/2014   LDLCALC 54 06/08/2014   CREATININE 0.90 10/08/2014    Retinal exam: annual    Self-care: The diet that the patient has been following is: None    Meals: 3 meals per day. Breakfast is eggs or cereal usually,  controlling portions      Dietician visit: Most recent: years ago.    Exercise:  he is active with moving equipment at work and some walking, does drive a truck                       Weight history:  Wt Readings from Last 3 Encounters:  02/08/15 213 lb 12.8 oz (  96.979 kg)  02/04/15 211 lb 2 oz (95.766 kg)  12/23/14 209 lb (94.802 kg)       Medication List       This list is accurate as of: 02/08/15 11:59 PM.  Always use your most recent med list.               azelastine 0.1 % nasal spray  Commonly known as:  ASTELIN  Place 2 sprays into both nostrils at bedtime as needed for rhinitis. Use in each nostril as directed     cefUROXime 500 MG tablet  Commonly known as:  CEFTIN  Take 1 tablet (500 mg total) by mouth 2 (two) times daily with a meal.     fish oil-omega-3 fatty acids 1000 MG capsule  Take 2 g by mouth 2 (two) times daily.     fluticasone 50 MCG/ACT nasal spray  Commonly known as:  FLONASE  Place 2 sprays into both nostrils daily as needed for allergies or rhinitis.     gabapentin 600 MG tablet  Commonly known as:  NEURONTIN  Take 1 tablet (600 mg total) by mouth 3 (three) times daily.     glucagon 1 MG injection  Commonly known as:   GLUCAGON EMERGENCY  Inject 1 mg into the vein once as needed.     insulin aspart 100 UNIT/ML injection  Commonly known as:  novoLOG  Inject 5 Units into the skin daily as needed for high blood sugar.     Insulin Glargine 100 UNIT/ML Solostar Pen  Commonly known as:  LANTUS SOLOSTAR  INJECT SUBCUTANEOUS 60 UNITS DAILY     Insulin Pen Needle 31G X 8 MM Misc  Commonly known as:  B-D ULTRAFINE III SHORT PEN  Use one per day     INSULIN SYRINGE .5CC/31GX5/16" 31G X 5/16" 0.5 ML Misc  USE ONE SYRINGE AS DIRECTED THREE TO FOUR TIMES DAILY     INVOKAMET 50-1000 MG Tabs  Generic drug:  Canagliflozin-Metformin HCl  TAKE 1 TABLET BY MOUTH TWICE DAILY     loratadine 10 MG tablet  Commonly known as:  CLARITIN  Take 10 mg by mouth daily.     METANX 3-90.314-2-35 MG Caps  Take 1 capsule by mouth 2 (two) times daily.     ONETOUCH VERIO test strip  Generic drug:  glucose blood  USE AS DIRECTED TO CHECK BLOOD SUGAR FOUR TIMES DAILY     PRADAXA 150 MG Caps capsule  Generic drug:  dabigatran  TAKE ONE CAPSULE BY MOUTH EVERY 12 HOURS     simvastatin 20 MG tablet  Commonly known as:  ZOCOR  Take 0.5 tablets (10 mg total) by mouth at bedtime.     tamsulosin 0.4 MG Caps capsule  Commonly known as:  FLOMAX  Take 1 capsule (0.4 mg total) by mouth daily.     traMADol 50 MG tablet  Commonly known as:  ULTRAM  Take 1 tablet (50 mg total) by mouth every 8 (eight) hours as needed.        Allergies:  No Known Allergies  Past Medical History  Diagnosis Date  . Diabetes mellitus     Type II dx 1999  . Hyperlipidemia   . Hypertension   . Benign prostatic hypertrophy     s/p TUNA procedure aprx 2006,  . Carpal tunnel syndrome 06/17/2009  . HYPERSOMNIA UNSPECIFIED 12/11/2007  . INSOMNIA-SLEEP DISORDER-UNSPEC 04/12/2010  . Allergic rhinitis   . History of cardiovascular stress test 8/11  Negative  . ED (erectile dysfunction)     After TUNA  . Elevated LFTs 2010    (-) chronic hep   panel  . Atrial fibrillation 12/21/2009    paroxysmal. Failed Multaq  . DM type 2 with diabetic peripheral neuropathy 10/15/2014    Past Surgical History  Procedure Laterality Date  . Vasectomy      several years ago  . Prostate surgery      TUNA  . Cystoscopy  11-09    neg  @ urology, was rx Flomax  . Tee without cardioversion N/A 03/23/2014    Procedure: TRANSESOPHAGEAL ECHOCARDIOGRAM (TEE);  Surgeon: Pricilla Riffle, MD;  Location: [facility] ENDOSCOPY;  Service: Cardiovascular;  Laterality: N/A;  . Ablation  03-24-14    PVI by Dr Johney Frame  . Atrial fibrillation ablation N/A 03/24/2014    Procedure: ATRIAL FIBRILLATION ABLATION;  Surgeon: Gardiner Rhyme, MD;  Location: [facility] CATH LAB;  Service: Cardiovascular;  Laterality: N/A;    Family History  Problem Relation Age of Onset  . Heart disease Mother     M-- MI,CHF  . Diabetes Mother   . Hypertension Mother   . Hypertension Father   . Cancer Neg Hx     Negative FH of Colon or Prostate Cancer    Social History:  reports that he quit smoking about 29 years ago. His smoking use included Cigarettes. He has never used smokeless tobacco. He reports that he does not drink alcohol or use illicit drugs.    Review of Systems       Lipids: He has been on low dose simvastatin for a few years, LDL excellent, HDL tends to be low       Lab Results  Component Value Date   CHOL 101 06/08/2014   HDL 30.80* 06/08/2014   LDLCALC 54 06/08/2014   TRIG 80.0 06/08/2014   CHOLHDL 3 06/08/2014               Hypertension: Currently not on any medications except low-dose metoprolol, he says blood pressure is usually good, high normal today       For several years has a history of Numbness, tingling  in feet and lower legs especially the right side. Relieved by gabapentin 3 times a day.     LABS:  Office Visit on 02/08/2015  Component Date Value Ref Range Status  . Hemoglobin A1C 02/08/2015 5.6   Final    Physical Examination:  BP 144/82 mmHg   Pulse 81  Temp(Src) 98.3 F (36.8 C)  Resp 16  Ht 5\' 11"  (1.803 m)  Wt 213 lb 12.8 oz (96.979 kg)  BMI 29.83 kg/m2  SpO2 94%  No  edema  ASSESSMENT/PLAN:    Diabetes type 2  with obesity  As discussed in history of present illness he has had good control of his diabetes with his losing and maintaining weight Also has benefited from Invokamet and is using only basal insulin  He has been very compliant with checking his blood sugars, staying very active and generally watching his diet However still requiring relatively large dose of basal insulin along with his Invokamet His blood sugars may be relatively higher if he is not very active on rainy days  Since his A1c is fairly normal now he can continue the same regimen; may try using Toujeo for more consistent action if covered by insurance  History of hyperlipidemia: Well controlled except for low HDL  Patient Instructions  Check if Toujeo insulin covered  Keaten Mashek 02/09/2015, 9:02 AM   Note: This office note was prepared with Insurance underwriter. Any transcriptional errors that result from this process are unintentional. .

## 2015-03-08 ENCOUNTER — Other Ambulatory Visit: Payer: Self-pay | Admitting: *Deleted

## 2015-03-08 MED ORDER — INSULIN ASPART 100 UNIT/ML FLEXPEN: PEN_INJECTOR | SUBCUTANEOUS | Status: DC

## 2015-03-19 ENCOUNTER — Telehealth: Payer: Self-pay | Admitting: Endocrinology

## 2015-03-19 ENCOUNTER — Other Ambulatory Visit: Payer: Self-pay | Admitting: Internal Medicine

## 2015-03-19 NOTE — Telephone Encounter (Signed)
Left message for patient to call back  

## 2015-03-19 NOTE — Telephone Encounter (Signed)
Patient called stating that his Rx is written for 34 day supply   Please 30 day supply   Rx: Novolog   Pharmacy: [facility]   Thank you

## 2015-03-24 ENCOUNTER — Other Ambulatory Visit: Payer: Self-pay | Admitting: Endocrinology

## 2015-03-26 ENCOUNTER — Telehealth: Payer: Self-pay | Admitting: Endocrinology

## 2015-03-26 ENCOUNTER — Other Ambulatory Visit: Payer: Self-pay | Admitting: *Deleted

## 2015-03-26 MED ORDER — CANAGLIFLOZIN-METFORMIN HCL 50-1000 MG PO TABS: 1.0000 | ORAL_TABLET | Freq: Two times a day (BID) | ORAL | Status: DC

## 2015-03-26 NOTE — Telephone Encounter (Signed)
Patient need a refill of INVOKAMET 50-1000 MG TABS,  [id number] - [place], [place] - [address] AT [facility] [address] [phone] (Phone) [phone] (Fax)

## 2015-03-26 NOTE — Telephone Encounter (Signed)
rx has been sent 

## 2015-04-20 ENCOUNTER — Other Ambulatory Visit: Payer: Self-pay | Admitting: Internal Medicine

## 2015-05-25 ENCOUNTER — Other Ambulatory Visit: Payer: Self-pay | Admitting: *Deleted

## 2015-05-25 MED ORDER — INSULIN GLARGINE 100 UNIT/ML SOLOSTAR PEN: PEN_INJECTOR | SUBCUTANEOUS | Status: DC

## 2015-05-26 ENCOUNTER — Encounter: Payer: Self-pay | Admitting: Internal Medicine

## 2015-05-26 ENCOUNTER — Ambulatory Visit (INDEPENDENT_AMBULATORY_CARE_PROVIDER_SITE_OTHER): Payer: BC Managed Care – PPO | Admitting: Internal Medicine

## 2015-05-26 VITALS — BP 140/80 | HR 73 | Ht 71.0 in | Wt 223.6 lb

## 2015-05-26 DIAGNOSIS — I10 Essential (primary) hypertension: Secondary | ICD-10-CM | POA: Diagnosis not present

## 2015-05-26 DIAGNOSIS — I480 Paroxysmal atrial fibrillation: Secondary | ICD-10-CM | POA: Diagnosis not present

## 2015-05-26 NOTE — Progress Notes (Signed)
PCP: [name], MD Primary Cardiologist:  Dr Neldon Newport is a 54 y.o. male who presents today for routine electrophysiology followup.  Since his last visit, the patient reports doing very well. He has had no AF.  He is pleased with his current health state.  Today, we talked about his job with [employer].  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 Very interesting job... Today, he denies symptoms of palpitations, chest pain, shortness of breath,  lower extremity edema, dizziness, presyncope, or syncope.  The patient is otherwise without complaint today.   Past Medical History  Diagnosis Date  . Diabetes mellitus     Type II dx 1999  . Hyperlipidemia   . Hypertension   . Benign prostatic hypertrophy     s/p TUNA procedure aprx 2006,  . Carpal tunnel syndrome 06/17/2009  . HYPERSOMNIA UNSPECIFIED 12/11/2007  . INSOMNIA-SLEEP DISORDER-UNSPEC 04/12/2010  . Allergic rhinitis   . History of cardiovascular stress test 8/11    Negative  . ED (erectile dysfunction)     After TUNA  . Elevated LFTs 2010    (-) chronic hep  panel  . Atrial fibrillation (HCC) 12/21/2009    paroxysmal. Failed Multaq  . DM type 2 with diabetic peripheral neuropathy (HCC) 10/15/2014   Past Surgical History  Procedure Laterality Date  . Vasectomy      several years ago  . Prostate surgery      TUNA  . Cystoscopy  11-09    neg  @ urology, was rx Flomax  . Tee without cardioversion N/A 03/23/2014    Procedure: TRANSESOPHAGEAL ECHOCARDIOGRAM (TEE);  Surgeon: Pricilla Riffle, MD;  Location: [facility] ENDOSCOPY;  Service: Cardiovascular;  Laterality: N/A;  . Ablation  03-24-14    PVI by Dr Johney Frame  . Atrial fibrillation ablation N/A 03/24/2014    Procedure: ATRIAL FIBRILLATION ABLATION;  Surgeon: Gardiner Rhyme, MD;  Location: [facility] CATH LAB;  Service: Cardiovascular;  Laterality: N/A;    ROS- all systems are reviewed and negatives except as per HPI above  Current Outpatient Prescriptions   Medication Sig Dispense Refill  . azelastine (ASTELIN) 0.1 % nasal spray Place 2 sprays into both nostrils at bedtime as needed for rhinitis. Use in each nostril as directed 30 mL 3  . Canagliflozin-Metformin HCl (INVOKAMET) 50-1000 MG TABS Take 1 tablet by mouth 2 (two) times daily. 60 tablet 3  . fish oil-omega-3 fatty acids 1000 MG capsule Take 2 g by mouth 2 (two) times daily.      . fluticasone (FLONASE) 50 MCG/ACT nasal spray Place 2 sprays into both nostrils daily. 16 g 6  . gabapentin (NEURONTIN) 600 MG tablet Take 1 tablet (600 mg total) by mouth 3 (three) times daily. 90 tablet 11  . glucagon (GLUCAGON EMERGENCY) 1 MG injection Inject 1 mg into the vein once as needed. 1 each 5  . insulin aspart (NOVOLOG FLEXPEN) 100 UNIT/ML FlexPen Inject 15 Units into the skin 3 (three) times daily with meals.    . Insulin Glargine (LANTUS SOLOSTAR) 100 UNIT/ML Solostar Pen INJECT SUBCUTANEOUS 60 UNITS DAILY 15 mL 1  . Insulin Pen Needle (B-D ULTRAFINE III SHORT PEN) 31G X 8 MM MISC Use one per day 30 each 3  . Insulin Syringe-Needle U-100 (INSULIN SYRINGE .5CC/31GX5/16") 31G X 5/16" 0.5 ML MISC USE ONE SYRINGE AS DIRECTED THREE TO FOUR TIMES DAILY 100 each 3  . loratadine (CLARITIN) 10 MG tablet Take 10 mg by mouth daily.     Marland Kitchen  Multiple Vitamin (MULTIVITAMIN) tablet Take 1 tablet by mouth daily.    Letta Pate. ONETOUCH VERIO test strip USE TO TEST BLOOD SUGAR FOUR TIMES DAILY 125 each 3  . PRADAXA 150 MG CAPS capsule TAKE ONE CAPSULE BY MOUTH EVERY 12 HOURS 60 capsule 6  . simvastatin (ZOCOR) 20 MG tablet Take 0.5 tablets (10 mg total) by mouth at bedtime. 30 tablet 6  . tamsulosin (FLOMAX) 0.4 MG CAPS capsule Take 1 capsule (0.4 mg total) by mouth daily. 30 capsule 12   No current facility-administered medications for this visit.    Physical Exam: Filed Vitals:   05/26/15 1631  BP: 140/80  Pulse: 73  Height: 5\' 11"  (1.803 m)  Weight: 223 lb 9.6 oz (101.424 kg)    GEN- The patient is well  appearing, alert and oriented x 3 today.   Head- normocephalic, atraumatic Eyes-  Sclera clear, conjunctiva pink Ears- hearing intact Oropharynx- clear Lungs- Clear to ausculation bilaterally, normal work of breathing Heart- Regular rate and rhythm, no murmurs, rubs or gallops, PMI not laterally displaced GI- soft, NT, ND, + BS Extremities- no clubbing, cyanosis, or edema  ekg today reveals sinus rhythm  Assessment and Plan:  1. afib Doing well s/p ablation off AADs No recurrent afib post ablation Continue pradaxa for chads2vasc score of at least 2 (DM, HTN)  2.  htn Stable No change required today  Return in 12 months  Hillis RangeJames Jayma Volpi MD, [facility] Centralia HospitalFACC 05/26/2015 10:58 PM

## 2015-05-26 NOTE — Patient Instructions (Signed)

## 2015-06-02 ENCOUNTER — Other Ambulatory Visit: Payer: Self-pay | Admitting: Cardiology

## 2015-06-03 ENCOUNTER — Telehealth: Payer: Self-pay | Admitting: Internal Medicine

## 2015-06-03 NOTE — Telephone Encounter (Signed)
REFILL 

## 2015-06-07 ENCOUNTER — Other Ambulatory Visit (INDEPENDENT_AMBULATORY_CARE_PROVIDER_SITE_OTHER): Payer: BC Managed Care – PPO

## 2015-06-07 DIAGNOSIS — E119 Type 2 diabetes mellitus without complications: Secondary | ICD-10-CM | POA: Diagnosis not present

## 2015-06-07 DIAGNOSIS — E785 Hyperlipidemia, unspecified: Secondary | ICD-10-CM | POA: Diagnosis not present

## 2015-06-07 LAB — LIPID PANEL
CHOLESTEROL: 92 mg/dL (ref 0–200)
HDL: 33 mg/dL — ABNORMAL LOW (ref >39.00)
LDL Cholesterol: 34 mg/dL (ref 0–99)
NONHDL: 59.31
Total CHOL/HDL Ratio: 3
Triglycerides: 126 mg/dL (ref 0.0–149.0)
VLDL: 25.2 mg/dL (ref 0.0–40.0)

## 2015-06-07 LAB — COMPREHENSIVE METABOLIC PANEL
ALBUMIN: 3.9 g/dL (ref 3.5–5.2)
ALT: 30 U/L (ref 0–53)
AST: 22 U/L (ref 0–37)
Alkaline Phosphatase: 76 U/L (ref 39–117)
BUN: 15 mg/dL (ref 6–23)
CALCIUM: 8.7 mg/dL (ref 8.4–10.5)
CO2: 28 mEq/L (ref 19–32)
CREATININE: 0.97 mg/dL (ref 0.40–1.50)
Chloride: 105 mEq/L (ref 96–112)
GFR: 84.97 mL/min (ref >60.00)
GLUCOSE: 208 mg/dL — AB (ref 70–99)
POTASSIUM: 3.9 meq/L (ref 3.5–5.1)
Sodium: 139 mEq/L (ref 135–145)
TOTAL PROTEIN: 6.5 g/dL (ref 6.0–8.3)
Total Bilirubin: 0.3 mg/dL (ref 0.2–1.2)

## 2015-06-07 LAB — HEMOGLOBIN A1C: Hgb A1c MFr Bld: 5.8 % (ref 4.6–6.5)

## 2015-06-10 ENCOUNTER — Ambulatory Visit: Payer: BC Managed Care – PPO | Admitting: Endocrinology

## 2015-06-18 ENCOUNTER — Other Ambulatory Visit: Payer: Self-pay | Admitting: Internal Medicine

## 2015-06-24 ENCOUNTER — Encounter: Payer: Self-pay | Admitting: Endocrinology

## 2015-06-24 ENCOUNTER — Other Ambulatory Visit: Payer: Self-pay | Admitting: *Deleted

## 2015-06-24 ENCOUNTER — Ambulatory Visit (INDEPENDENT_AMBULATORY_CARE_PROVIDER_SITE_OTHER): Payer: BC Managed Care – PPO | Admitting: Endocrinology

## 2015-06-24 VITALS — BP 128/68 | HR 76 | Temp 98.0°F | Resp 16 | Ht 73.0 in | Wt 223.4 lb

## 2015-06-24 DIAGNOSIS — Z794 Long term (current) use of insulin: Secondary | ICD-10-CM

## 2015-06-24 DIAGNOSIS — E785 Hyperlipidemia, unspecified: Secondary | ICD-10-CM | POA: Diagnosis not present

## 2015-06-24 DIAGNOSIS — E119 Type 2 diabetes mellitus without complications: Secondary | ICD-10-CM | POA: Diagnosis not present

## 2015-06-24 MED ORDER — CANAGLIFLOZIN-METFORMIN HCL 150-1000 MG PO TABS: ORAL_TABLET | ORAL | Status: DC

## 2015-06-24 NOTE — Progress Notes (Signed)
Patient ID: Danny [name], male   DOB: 1961/05/26, 54 y.o.   MRN: [id number]     Reason for Appointment: Followup for Type 2 Diabetes  Referring physician: Drue Novel  History of Present Illness:          Diagnosis: Type 2 diabetes mellitus, date of diagnosis: 1992       Past history:  He has had long-standing diabetes and previously was treated with metformin, Amaryl, Januvia and Actos He was taken off metformin because of fear of liver problems.  May have been taken taken off Actos because of swelling of his legs but was on 45 mg at that time Over the last 2 years at least his A1c has been consistently over 8% He was started on insulin in 2011 probably when his A1c was over 9% Initially was given Lantus and Humalog and subsequently switched to 70/30 twice a day for unknown reasons Has not taken Byetta or Victoza in the past He had been on 70/30 insulin initially for a few years Previously A1c was 8% or more consistently before starting Invokamet.  Recent history:   INSULIN regimen is described as:  Lantus 60 units at 9 pm Novolog 5-8 actid  His blood sugars are continuing to be well controlled with A1c 5.8     Current blood sugar patterns and problems identified:   he was previously taking  52 units of Lantus but since his prescription was  Written for 60 units units he increased the dose on his own. However his fasting readings are still relatively high   He was not taking much Novolog insulin previously but is still taking some but he is doing this only based on his pre-meal blood sugar   His blood sugars are usually low-normal at lunchtime , he will feel hypoglycemic if he is very active.   Blood sugars are fairly good before and after supper with only one high bedtime reading  He is generally watching his diet  Except recently and has gained weight   He continues to take Invokamet without side effects       Oral hypoglycemic drugs the patient is taking are:  Invokamet 50/1000 twice a day    Side effects from medications have been: None  Compliance with the medical regimen: Good Hypoglycemia: Only once with glucose 59 at lunch  Glucose monitoring:  done 3-4 times a day         Glucometer: One Touch.      Blood Glucose readings    Mean values apply above for all meters except median for One Touch  PRE-MEAL Fasting Lunch Dinner  PCS  Overall  Glucose range:  99-160   75-107   98-138   84-147   75-182   Mean/median:  136   84   128   124  123    Glycemic control:   Lab Results  Component Value Date   HGBA1C 5.8 06/07/2015   HGBA1C 5.6 02/08/2015   HGBA1C 5.4 10/08/2014   Lab Results  Component Value Date   MICROALBUR <0.7 10/08/2014   LDLCALC 34 06/07/2015   CREATININE 0.97 06/07/2015    Retinal exam: annual    Self-care:  Meals: 3 meals per day. Breakfast is eggs or cereal usually,  controlling portions      Dietician visit: Most recent: years ago.    Exercise:  he is active with moving equipment at work and some walking, does drive a truck  Weight history:  Wt Readings from Last 3 Encounters:  06/24/15 223 lb 6.4 oz (101.334 kg)  05/26/15 223 lb 9.6 oz (101.424 kg)  02/08/15 213 lb 12.8 oz (96.979 kg)       Medication List       This list is accurate as of: 06/24/15 11:59 PM.  Always use your most recent med list.               azelastine 0.1 % nasal spray  Commonly known as:  ASTELIN  Place 2 sprays into both nostrils at bedtime as needed for rhinitis. Use in each nostril as directed     Canagliflozin-Metformin HCl [phone] MG Tabs  Commonly known as:  INVOKAMET  Take 2 tablets daily     fish oil-omega-3 fatty acids 1000 MG capsule  Take 2 g by mouth 2 (two) times daily.     fluticasone 50 MCG/ACT nasal spray  Commonly known as:  FLONASE  Place 2 sprays into both nostrils daily.     gabapentin 600 MG tablet  Commonly known as:  NEURONTIN  Take 1 tablet (600 mg total) by mouth  3 (three) times daily.     glucagon 1 MG injection  Commonly known as:  GLUCAGON EMERGENCY  Inject 1 mg into the vein once as needed.     Insulin Glargine 100 UNIT/ML Solostar Pen  Commonly known as:  LANTUS SOLOSTAR  INJECT SUBCUTANEOUS 60 UNITS DAILY     Insulin Pen Needle 31G X 8 MM Misc  Commonly known as:  B-D ULTRAFINE III SHORT PEN  Use one per day     INSULIN SYRINGE .5CC/31GX5/16" 31G X 5/16" 0.5 ML Misc  USE ONE SYRINGE AS DIRECTED THREE TO FOUR TIMES DAILY     loratadine 10 MG tablet  Commonly known as:  CLARITIN  Take 10 mg by mouth daily.     multivitamin tablet  Take 1 tablet by mouth daily.     NOVOLOG FLEXPEN 100 UNIT/ML FlexPen  Generic drug:  insulin aspart  Inject 5-10 Units into the skin 3 (three) times daily with meals.     ONETOUCH VERIO test strip  Generic drug:  glucose blood  USE TO TEST BLOOD SUGAR FOUR TIMES DAILY     PRADAXA 150 MG Caps capsule  Generic drug:  dabigatran  TAKE 1 CAPSULE BY MOUTH EVERY 12 HOURS     simvastatin 20 MG tablet  Commonly known as:  ZOCOR  Take 0.5 tablets (10 mg total) by mouth at bedtime.     tamsulosin 0.4 MG Caps capsule  Commonly known as:  FLOMAX  Take 1 capsule (0.4 mg total) by mouth daily.        Allergies:  No Known Allergies  Past Medical History  Diagnosis Date  . Diabetes mellitus     Type II dx 1999  . Hyperlipidemia   . Hypertension   . Benign prostatic hypertrophy     s/p TUNA procedure aprx 2006,  . Carpal tunnel syndrome 06/17/2009  . HYPERSOMNIA UNSPECIFIED 12/11/2007  . INSOMNIA-SLEEP DISORDER-UNSPEC 04/12/2010  . Allergic rhinitis   . History of cardiovascular stress test 8/11    Negative  . ED (erectile dysfunction)     After TUNA  . Elevated LFTs 2010    (-) chronic hep  panel  . Atrial fibrillation (HCC) 12/21/2009    paroxysmal. Failed Multaq  . DM type 2 with diabetic peripheral neuropathy (HCC) 10/15/2014    Past Surgical History  Procedure Laterality Date  .  Vasectomy      several years ago  . Prostate surgery      TUNA  . Cystoscopy  11-09    neg  @ urology, was rx Flomax  . Tee without cardioversion N/A 03/23/2014    Procedure: TRANSESOPHAGEAL ECHOCARDIOGRAM (TEE);  Surgeon: Pricilla Riffle, MD;  Location: [facility] ENDOSCOPY;  Service: Cardiovascular;  Laterality: N/A;  . Ablation  03-24-14    PVI by Dr Johney Frame  . Atrial fibrillation ablation N/A 03/24/2014    Procedure: ATRIAL FIBRILLATION ABLATION;  Surgeon: Gardiner Rhyme, MD;  Location: [facility] CATH LAB;  Service: Cardiovascular;  Laterality: N/A;    Family History  Problem Relation Age of Onset  . Heart disease Mother     M-- MI,CHF  . Diabetes Mother   . Hypertension Mother   . Hypertension Father   . Cancer Neg Hx     Negative FH of Colon or Prostate Cancer    Social History:  reports that he quit smoking about 30 years ago. His smoking use included Cigarettes. He has never used smokeless tobacco. He reports that he does not drink alcohol or use illicit drugs.    Review of Systems       Lipids: He has been on low dose simvastatin for a few years, LDL excellent, HDL tends to be low       Lab Results  Component Value Date   CHOL 92 06/07/2015   HDL 33.00* 06/07/2015   LDLCALC 34 06/07/2015   TRIG 126.0 06/07/2015   CHOLHDL 3 06/07/2015               Hypertension: Currently not on any medications except low-dose metoprolol, he says blood pressure is usually good at home Initial blood pressure was high in the office today       For several years has a history of Numbness, tingling  in feet and lower legs especially the right side, better now.  Relieved by gabapentin 3 times a day.     LABS:  No visits with results within 1 Week(s) from this visit. Latest known visit with results is:  Lab on 06/07/2015  Component Date Value Ref Range Status  . Hgb A1c MFr Bld 06/07/2015 5.8  4.6 - 6.5 % Final   Glycemic Control Guidelines for People with Diabetes:Non Diabetic:  <6%Goal of  Therapy: <7%Additional Action Suggested:  >8%   . Sodium 06/07/2015 139  135 - 145 mEq/L Final  . Potassium 06/07/2015 3.9  3.5 - 5.1 mEq/L Final  . Chloride 06/07/2015 105  96 - 112 mEq/L Final  . CO2 06/07/2015 28  19 - 32 mEq/L Final  . Glucose, Bld 06/07/2015 208* 70 - 99 mg/dL Final  . BUN 19/14/7829 15  6 - 23 mg/dL Final  . Creatinine, Ser 06/07/2015 0.97  0.40 - 1.50 mg/dL Final  . Total Bilirubin 06/07/2015 0.3  0.2 - 1.2 mg/dL Final  . Alkaline Phosphatase 06/07/2015 76  39 - 117 U/L Final  . AST 06/07/2015 22  0 - 37 U/L Final  . ALT 06/07/2015 30  0 - 53 U/L Final  . Total Protein 06/07/2015 6.5  6.0 - 8.3 g/dL Final  . Albumin 56/21/3086 3.9  3.5 - 5.2 g/dL Final  . Calcium 57/84/6962 8.7  8.4 - 10.5 mg/dL Final  . GFR 95/28/4132 84.97  >60.00 mL/min Final  . Cholesterol 06/07/2015 92  0 - 200 mg/dL Final   ATP III Classification       Desirable:  <  200 mg/dL               Borderline High:  200 - 239 mg/dL          High:  > = 161 mg/dL  . Triglycerides 06/07/2015 126.0  0.0 - 149.0 mg/dL Final   Normal:  <096 mg/dLBorderline High:  150 - 199 mg/dL  . HDL 06/07/2015 33.00* >39.00 mg/dL Final  . VLDL 04/54/0981 25.2  0.0 - 40.0 mg/dL Final  . LDL Cholesterol 06/07/2015 34  0 - 99 mg/dL Final  . Total CHOL/HDL Ratio 06/07/2015 3   Final                  Men          Women1/2 Average Risk     3.4          3.3Average Risk          5.0          4.42X Average Risk          9.6          7.13X Average Risk          15.0          11.0                      . NonHDL 06/07/2015 59.31   Final   NOTE:  Non-HDL goal should be 30 mg/dL higher than patient's LDL goal (i.e. LDL goal of < 70 mg/dL, would have non-HDL goal of < 100 mg/dL)    Physical Examination:  BP 128/68 mmHg  Pulse 76  Temp(Src) 98 F (36.7 C)  Resp 16  Ht 6\' 1"  (1.854 m)  Wt 223 lb 6.4 oz (101.334 kg)  BMI 29.48 kg/m2  No  edema  ASSESSMENT/PLAN:    Diabetes type 2  with obesity  As discussed in history of  present illness he has had good control of his diabetes with A1c still under 6 However he has gained weight from inconsistent diet recently He appears to be taking more insulin and is taking mealtime coverage more often now However fasting blood sugars still appear to be relatively high  He thinks he can start watching his diet better Discussed not needing Novolog in the morning since he is usually having low normal sugars by lunchtime because of his activity level Discussed adjusting the Novolog based on what he is eating and Lantus based on fasting blood sugar patterns To help his weight loss and probably reduce insulin requirement he can increase to Invokamet to the maximum dose  History of hyperlipidemia: Well controlled except for low HDL  Patient Instructions  May reduce Lantus  insulin 2-4 units if am sugars stay <120  Skip Novolog in am   Counseling time on subjects discussed above is over 50% of today's 25 minute visit   Quatisha Zylka 06/25/2015, 11:01 AM   Note: This office note was prepared with Insurance underwriter. Any transcriptional errors that result from this process are unintentional. .

## 2015-06-24 NOTE — Patient Instructions (Addendum)
May reduce Lantus  insulin 2-4 units if am sugars stay <120  Skip Novolog in am

## 2015-07-05 ENCOUNTER — Other Ambulatory Visit: Payer: Self-pay | Admitting: *Deleted

## 2015-07-05 MED ORDER — DABIGATRAN ETEXILATE MESYLATE 150 MG PO CAPS: 150.0000 mg | ORAL_CAPSULE | Freq: Two times a day (BID) | ORAL | Status: DC

## 2015-07-06 ENCOUNTER — Telehealth: Payer: Self-pay | Admitting: Cardiology

## 2015-07-06 ENCOUNTER — Other Ambulatory Visit: Payer: Self-pay | Admitting: *Deleted

## 2015-07-06 ENCOUNTER — Telehealth: Payer: Self-pay | Admitting: Internal Medicine

## 2015-07-06 ENCOUNTER — Telehealth: Payer: Self-pay

## 2015-07-06 NOTE — Telephone Encounter (Signed)
Dc pradaxa apixaban 5 bid fuov Danny [name]

## 2015-07-06 NOTE — Telephone Encounter (Signed)
Patient calling the office for samples of medication:   1.  What medication and dosage are you requesting samples for? Pradaxa   2.  Are you currently out of this medication? Yes , he only has two days left and his insurance no longer covers this

## 2015-07-06 NOTE — Telephone Encounter (Signed)
Prior auth for Pradaxa150mg sent to [facility]. 

## 2015-07-06 NOTE — Telephone Encounter (Signed)
New message      Pt c/o medication issue:  1. Name of Medication: pradaxa 2. How are you currently taking this medication (dosage and times per day)? 150mg  3. Are you having a reaction (difficulty breathing--STAT)? no  4. What is your medication issue?  Ins will not cover medication for 2017.  Insurance company recommend eliquis or xarelto.  Pt has 2 pills of pradaxa left.  Pt want Dr Johney FrameAllred to decide which blood thinner to take.  Please call

## 2015-07-06 NOTE — Telephone Encounter (Signed)
Spoke with pt dtr, aware no samples of pradaxa available. She reports eliquis or xarelto are preferred by the insurance now. Will forward to dr Jens Somcrenshaw for dose change.

## 2015-07-07 MED ORDER — APIXABAN 5 MG PO TABS
5.0000 mg | ORAL_TABLET | Freq: Two times a day (BID) | ORAL | Status: DC
Start: 2015-07-07 — End: 2016-01-21

## 2015-07-07 NOTE — Telephone Encounter (Signed)
Spoke with pt dtr, aware of medication change. New script sent to the pharmacy. Follow up scheduled

## 2015-07-08 NOTE — Telephone Encounter (Signed)
Discussed with Dr Johney FrameAllred.  Will call in Eliquis 5 mg bid for patient in place of the Pradaxa

## 2015-07-12 NOTE — Progress Notes (Signed)
HPI: FU atrial fibrillation. He failed Multaq before and was switched previously to flecainide. He has no history of coronary artery disease. Patient had ablation of his atrial fibrillation in September 2015 by Dr. Johney Frame. Nuclear study August 2015 showed ejection fraction 56% and normal perfusion. Echocardiogram September 2015 showed normal LV function and no significant valvular disease. Since he was last seen, the patient denies any dyspnea on exertion, orthopnea, PND, pedal edema, palpitations, syncope or chest pain.   Current Outpatient Prescriptions  Medication Sig Dispense Refill  . apixaban (ELIQUIS) 5 MG TABS tablet Take 1 tablet (5 mg total) by mouth 2 (two) times daily. 60 tablet 6  . azelastine (ASTELIN) 0.1 % nasal spray Place 2 sprays into both nostrils at bedtime as needed for rhinitis. Use in each nostril as directed 30 mL 3  . Canagliflozin-Metformin HCl (INVOKAMET) [phone] MG TABS Take 2 tablets daily 60 tablet 3  . fish oil-omega-3 fatty acids 1000 MG capsule Take 2 g by mouth 2 (two) times daily.      . fluticasone (FLONASE) 50 MCG/ACT nasal spray Place 2 sprays into both nostrils daily. 16 g 6  . gabapentin (NEURONTIN) 600 MG tablet Take 1 tablet (600 mg total) by mouth 3 (three) times daily. 90 tablet 11  . glucagon (GLUCAGON EMERGENCY) 1 MG injection Inject 1 mg into the vein once as needed. 1 each 5  . insulin aspart (NOVOLOG FLEXPEN) 100 UNIT/ML FlexPen Inject 5-10 Units into the skin 3 (three) times daily with meals.     . Insulin Glargine (LANTUS SOLOSTAR) 100 UNIT/ML Solostar Pen INJECT SUBCUTANEOUS 60 UNITS DAILY 15 mL 1  . Insulin Pen Needle (B-D ULTRAFINE III SHORT PEN) 31G X 8 MM MISC Use one per day 30 each 3  . Insulin Syringe-Needle U-100 (INSULIN SYRINGE .5CC/31GX5/16") 31G X 5/16" 0.5 ML MISC USE ONE SYRINGE AS DIRECTED THREE TO FOUR TIMES DAILY 100 each 3  . loratadine (CLARITIN) 10 MG tablet Take 10 mg by mouth daily.     . Multiple Vitamin  (MULTIVITAMIN) tablet Take 1 tablet by mouth daily.    Letta Pate VERIO test strip USE TO TEST BLOOD SUGAR FOUR TIMES DAILY 125 each 3  . simvastatin (ZOCOR) 20 MG tablet Take 0.5 tablets (10 mg total) by mouth at bedtime. 15 tablet 6  . tamsulosin (FLOMAX) 0.4 MG CAPS capsule Take 1 capsule (0.4 mg total) by mouth daily. 30 capsule 12   No current facility-administered medications for this visit.     Past Medical History  Diagnosis Date  . Diabetes mellitus     Type II dx 1999  . Hyperlipidemia   . Hypertension   . Benign prostatic hypertrophy     s/p TUNA procedure aprx 2006,  . Carpal tunnel syndrome 06/17/2009  . HYPERSOMNIA UNSPECIFIED 12/11/2007  . INSOMNIA-SLEEP DISORDER-UNSPEC 04/12/2010  . Allergic rhinitis   . History of cardiovascular stress test 8/11    Negative  . ED (erectile dysfunction)     After TUNA  . Elevated LFTs 2010    (-) chronic hep  panel  . Atrial fibrillation (HCC) 12/21/2009    paroxysmal. Failed Multaq  . DM type 2 with diabetic peripheral neuropathy (HCC) 10/15/2014    Past Surgical History  Procedure Laterality Date  . Vasectomy      several years ago  . Prostate surgery      TUNA  . Cystoscopy  11-09    neg  @ urology, was rx Flomax  .  Tee without cardioversion N/A 03/23/2014    Procedure: TRANSESOPHAGEAL ECHOCARDIOGRAM (TEE);  Surgeon: Pricilla RifflePaula Ross V, MD;  [facility] END[facility];  Service: Cardiovascular;  Laterality: N/A;  . Ablation  03-24-14    PVI by Dr Johney FrameAllred  . Atrial fibrillation ablation N/A 03/24/2014    Procedure: ATRIAL FIBRILLATION ABLATION;  Surgeon: Gardiner RhymeJames D Allred, MD;  Location: [facility] CATH LAB;  Service: Cardiovascular;  Laterality: N/A;    Social History   Social History  . Marital Status: Married    Spouse Name: N/A  . Number of Children: 2  . Years of Education: N/A   Occupational History  . Works for [employer] (state).  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 Smoking status:  Former Smoker    Types: Cigarettes    Quit date: 06/26/1985  . Smokeless tobacco: Never Used  . Alcohol Use: No     Comment:    . Drug Use: No  . Sexual Activity: Not on file   Other Topics Concern  . Not on file   Social History Narrative   has a girlfriend, lives in [place]             Family History  Problem Relation Age of Onset  . Heart disease Mother     M-- MI,CHF  . Diabetes Mother   . Hypertension Mother   . Hypertension Father   . Cancer Neg Hx     Negative FH of Colon or Prostate Cancer    ROS: no fevers or chills, productive cough, hemoptysis, dysphasia, odynophagia, melena, hematochezia, dysuria, hematuria, rash, seizure activity, orthopnea, PND, pedal edema, claudication. Remaining systems are negative.  Physical Exam: Well-developed well-nourished in no acute distress.  Skin is warm and dry.  HEENT is normal.  Neck is supple.  Chest is clear to auscultation with normal expansion.  Cardiovascular exam is regular rate and rhythm.  Abdominal exam nontender or distended. No masses palpated. Extremities show no edema. neuro grossly intact  ECG 05/26/2015-sinus rhythm with no ST changes.

## 2015-07-13 ENCOUNTER — Ambulatory Visit (INDEPENDENT_AMBULATORY_CARE_PROVIDER_SITE_OTHER): Payer: BC Managed Care – PPO | Admitting: Cardiology

## 2015-07-13 ENCOUNTER — Encounter: Payer: Self-pay | Admitting: Cardiology

## 2015-07-13 VITALS — BP 126/80 | HR 82 | Ht 71.0 in | Wt 218.5 lb

## 2015-07-13 DIAGNOSIS — I4891 Unspecified atrial fibrillation: Secondary | ICD-10-CM

## 2015-07-13 DIAGNOSIS — I10 Essential (primary) hypertension: Secondary | ICD-10-CM | POA: Diagnosis not present

## 2015-07-13 LAB — CBC
HCT: 48.2 % (ref 39.0–52.0)
Hemoglobin: 16.3 g/dL (ref 13.0–17.0)
MCH: 28.1 pg (ref 26.0–34.0)
MCHC: 33.8 g/dL (ref 30.0–36.0)
MCV: 83 fL (ref 78.0–100.0)
MPV: 9.1 fL (ref 8.6–12.4)
PLATELETS: 267 10*3/uL (ref 150–400)
RBC: 5.81 MIL/uL (ref 4.22–5.81)
RDW: 13.5 % (ref 11.5–15.5)
WBC: 7.4 10*3/uL (ref 4.0–10.5)

## 2015-07-13 NOTE — Assessment & Plan Note (Signed)
Patient remains in sinus rhythm on examination. He is status post ablation. Multiple embolic risk factors. Continue apixaban. Renal function checked recently. Check hemoglobin.

## 2015-07-13 NOTE — Assessment & Plan Note (Signed)
>>  ASSESSMENT AND PLAN FOR DYSLIPIDEMIA WRITTEN ON 07/13/2015  9:16 AM BY CRENSHAW, REDELL RAMAN, MD  Continue statin. Lipids and liver monitored by primary care.

## 2015-07-13 NOTE — Assessment & Plan Note (Signed)
Continue statin. Lipids and liver monitored by primary care. 

## 2015-07-13 NOTE — Patient Instructions (Signed)

## 2015-07-13 NOTE — Assessment & Plan Note (Signed)
Blood pressure controlled. 

## 2015-07-17 ENCOUNTER — Other Ambulatory Visit: Payer: Self-pay | Admitting: Endocrinology

## 2015-07-19 ENCOUNTER — Telehealth: Payer: Self-pay | Admitting: Endocrinology

## 2015-07-19 NOTE — Telephone Encounter (Signed)
Pt is requesting follow up regarding the insulin issues, his insurance will no longer cover lantus so please let him know what we are doing,

## 2015-07-19 NOTE — Telephone Encounter (Signed)
rx was switched to [place], patient aware.

## 2015-07-20 ENCOUNTER — Telehealth: Payer: Self-pay | Admitting: Endocrinology

## 2015-07-20 NOTE — Telephone Encounter (Signed)
Patient stated that pharmacy haven't received his medication Danny [name], He said he only have one pill, please advise

## 2015-07-21 ENCOUNTER — Telehealth: Payer: Self-pay | Admitting: Endocrinology

## 2015-07-21 ENCOUNTER — Other Ambulatory Visit: Payer: Self-pay | Admitting: *Deleted

## 2015-07-21 MED ORDER — INSULIN DEGLUDEC 100 UNIT/ML ~~LOC~~ SOPN: 60.0000 [IU] | PEN_INJECTOR | Freq: Every day | SUBCUTANEOUS | Status: DC

## 2015-07-21 NOTE — Telephone Encounter (Signed)
flu

## 2015-07-21 NOTE — Telephone Encounter (Signed)
Rx had been verbally given to the pharmacist 2 days ago, e-sent it today to [facility]

## 2015-07-21 NOTE — Telephone Encounter (Signed)
Patient stated that he out of his medicationInsulin Degl[facility]ec [facility]) 100 UNIT/ML SOPN  it will be there Friday, please advise

## 2015-07-21 NOTE — Telephone Encounter (Signed)
FYI:  I spoke with him and advised him to call around to other pharmacies in his area to see if they have it in stock, he will let me know.

## 2015-07-29 ENCOUNTER — Telehealth: Payer: Self-pay | Admitting: Endocrinology

## 2015-07-29 ENCOUNTER — Other Ambulatory Visit: Payer: Self-pay | Admitting: *Deleted

## 2015-07-29 MED ORDER — DAPAGLIFLOZIN PRO-METFORMIN ER 5-1000 MG PO TB24: 5.0000 mg | ORAL_TABLET | Freq: Two times a day (BID) | ORAL | Status: DC

## 2015-07-29 NOTE — Telephone Encounter (Signed)
Patient need a prior Auth on medication Invokamet send to  [facility] [address] - , [place] - [address] [phone] (Phone) [phone] (Fax)

## 2015-07-29 NOTE — Telephone Encounter (Signed)
Per patients insurance preference and Dr. Lucianne Muss this was changed to [id number] [id number] [phone], rx has been sent.

## 2015-08-11 ENCOUNTER — Telehealth: Payer: Self-pay | Admitting: *Deleted

## 2015-08-11 NOTE — Telephone Encounter (Signed)
Unable to reach patient at time of pre-visit call. Left message for patient to return call when available.  

## 2015-08-12 ENCOUNTER — Ambulatory Visit (INDEPENDENT_AMBULATORY_CARE_PROVIDER_SITE_OTHER): Payer: BC Managed Care – PPO | Admitting: Internal Medicine

## 2015-08-12 ENCOUNTER — Telehealth: Payer: Self-pay

## 2015-08-12 ENCOUNTER — Encounter: Payer: Self-pay | Admitting: Internal Medicine

## 2015-08-12 ENCOUNTER — Other Ambulatory Visit: Payer: Self-pay | Admitting: Endocrinology

## 2015-08-12 VITALS — BP 122/76 | HR 62 | Temp 98.2°F | Ht 71.0 in | Wt 217.2 lb

## 2015-08-12 DIAGNOSIS — Z23 Encounter for immunization: Secondary | ICD-10-CM | POA: Diagnosis not present

## 2015-08-12 DIAGNOSIS — Z125 Encounter for screening for malignant neoplasm of prostate: Secondary | ICD-10-CM | POA: Diagnosis not present

## 2015-08-12 DIAGNOSIS — Z0000 Encounter for general adult medical examination without abnormal findings: Secondary | ICD-10-CM

## 2015-08-12 DIAGNOSIS — Z114 Encounter for screening for human immunodeficiency virus [HIV]: Secondary | ICD-10-CM

## 2015-08-12 DIAGNOSIS — Z09 Encounter for follow-up examination after completed treatment for conditions other than malignant neoplasm: Secondary | ICD-10-CM

## 2015-08-12 DIAGNOSIS — R5383 Other fatigue: Secondary | ICD-10-CM

## 2015-08-12 DIAGNOSIS — E1142 Type 2 diabetes mellitus with diabetic polyneuropathy: Secondary | ICD-10-CM

## 2015-08-12 LAB — HIV ANTIBODY (ROUTINE TESTING W REFLEX): HIV 1&2 Ab, 4th Generation: NONREACTIVE

## 2015-08-12 LAB — TSH: TSH: 1.96 u[IU]/mL (ref 0.35–4.50)

## 2015-08-12 LAB — PSA: PSA: 0.47 ng/mL (ref 0.10–4.00)

## 2015-08-12 MED ORDER — KETOCONAZOLE 2 % EX CREA: 1.0000 "application " | TOPICAL_CREAM | Freq: Every day | CUTANEOUS | Status: DC

## 2015-08-12 NOTE — Assessment & Plan Note (Signed)
DM neuropathy: Under excellent control, recommend to go back to gabapentin 3 times a day for neuropathy. Occ penis glans get red, likely balanitis, recommend nizoral  as needed. HTN: BPs normal on no medications Hyperlipidemia: Continue simvastatin, last FLP excellent A fibrillation: Seems to be in sinus rhythm today, continue anticoagulation per cards BPH: No symptom, DRE normal, checking a PSA "Muscle weakness", some fatigue --check a testost per pt request RTC 8 months

## 2015-08-12 NOTE — Progress Notes (Signed)
Subjective:    Patient ID: Danny [name], male    DOB: 1960/09/16, 55 y.o.   MRN: [id number]  DOS:  08/12/2015 Type of visit - description : CPX Interval history: Notes from cardiology and endocrinology reviewed. He recently tried to decrease  gabapentin from 3 times a day to 2 times a day. Has noted an increase in neuropathy symptoms described as burning going up to the knee.   Review of Systems  Constitutional: No fever. No chills. No unexplained wt changes. No unusual sweats  HEENT: no facial swelling, no voice changes. This morning noted a lot of wax from the right ear with a small amount of blood. No discharge or decreased hearing No eye discharge, no eye  redness , no  intolerance to light   Respiratory: No wheezing , no  difficulty breathing. No cough , no mucus production  Cardiovascular: No CP, no leg swelling , no  Palpitations  GI: no nausea, no vomiting, no diarrhea. Occasionally has some lower abdominal pain, sharp, last minute, usually after BM No blood in the stools. No dysphagia, no odynophagia    Endocrine: No polyphagia, no polyuria , no polydipsia  GU:  Occasionally, the skin around the urinary meatus-glans gets red , no  dysuria, gross hematuria, difficulty urinating. No urinary urgency, no frequency.  Musculoskeletal: Mild aches and pains at joints, , has noted the muscle strength has gradually decreased over the years . occ fatigue, check testosterone? Skin: No change in the color of the skin, palor , no  Rash  Allergic, immunologic: No environmental allergies , no  food allergies  Neurological: No dizziness no  syncope. No headaches. No diplopia, no slurred, no slurred speech, no motor deficits, no facial  Numbness   Hematological: No enlarged lymph nodes, no easy bruising , no unusual bleedings  Psychiatry: No suicidal ideas, no hallucinations, no beavior problems, no confusion.  No unusual/severe anxiety, no depression   Past Medical History    Diagnosis Date  . Diabetes mellitus     Type II dx 1999  . Hyperlipidemia   . Hypertension   . Benign prostatic hypertrophy     s/p TUNA procedure aprx 2006,  . Carpal tunnel syndrome 06/17/2009  . HYPERSOMNIA UNSPECIFIED 12/11/2007  . INSOMNIA-SLEEP DISORDER-UNSPEC 04/12/2010  . Allergic rhinitis   . History of cardiovascular stress test 8/11    Negative  . ED (erectile dysfunction)     After TUNA  . Elevated LFTs 2010    (-) chronic hep  panel  . Atrial fibrillation (HCC) 12/21/2009    paroxysmal. Failed Multaq  . DM type 2 with diabetic peripheral neuropathy (HCC) 10/15/2014    Past Surgical History  Procedure Laterality Date  . Vasectomy      several years ago  . Prostate surgery      TUNA  . Cystoscopy  11-09    neg  @ urology, was rx Flomax  . Tee without cardioversion N/A 03/23/2014    Procedure: TRANSESOPHAGEAL ECHOCARDIOGRAM (TEE);  Surgeon: Pricilla Riffle, MD;  Location: [facility] ENDOSCOPY;  Service: Cardiovascular;  Laterality: N/A;  . Ablation  03-24-14    PVI by Dr Johney Frame  . Atrial fibrillation ablation N/A 03/24/2014    Procedure: ATRIAL FIBRILLATION ABLATION;  Surgeon: Gardiner Rhyme, MD;  Location: [facility] CATH LAB;  Service: Cardiovascular;  Laterality: N/A;    Social History   Social History  . Marital Status: Married    Spouse Name: N/A  . Number of Children: 2  .  Years of Education: N/A   Occupational History  . 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 Smoking status: Former Smoker    Types: Cigarettes    Quit date: 06/26/1985  . Smokeless tobacco: Never Used  . Alcohol Use: No     Comment:    . Drug Use: No  . Sexual Activity: Not on file   Other Topics Concern  . Not on file   Social History Narrative   has a girlfriend, lives in [place]              Family History  Problem Relation Age of Onset  . Heart disease Mother     M-- MI,CHF  . Diabetes Mother   . Hypertension  Mother   . Hypertension Father   . Cancer Neg Hx     Negative FH of Colon or Prostate Cancer       Medication List       This list is accurate as of: 08/12/15  4:33 PM.  Always use your most recent med list.               apixaban 5 MG Tabs tablet  Commonly known as:  ELIQUIS  Take 1 tablet (5 mg total) by mouth 2 (two) times daily.     azelastine 0.1 % nasal spray  Commonly known as:  ASTELIN  Place 2 sprays into both nostrils at bedtime as needed for rhinitis. Use in each nostril as directed     Canagliflozin-Metformin HCl [phone] MG Tabs  Commonly known as:  INVOKAMET  Take 2 tablets daily     Dapagliflozin-Metformin HCl ER 10-998 MG Tb24  Commonly known as:  XIGDUO XR  Take 5-1,000 mg by mouth 2 (two) times daily.     fish oil-omega-3 fatty acids 1000 MG capsule  Take 2 g by mouth 2 (two) times daily.     fluticasone 50 MCG/ACT nasal spray  Commonly known as:  FLONASE  Place 2 sprays into both nostrils daily.     gabapentin 600 MG tablet  Commonly known as:  NEURONTIN  Take 1 tablet (600 mg total) by mouth 3 (three) times daily.     glucagon 1 MG injection  Commonly known as:  GLUCAGON EMERGENCY  Inject 1 mg into the vein once as needed.     Insulin Degludec 100 UNIT/ML Sopn  Commonly known as:  TRESIBA FLEXTOUCH  Inject 60 Units into the skin daily.     Insulin Pen Needle 31G X 8 MM Misc  Commonly known as:  B-D ULTRAFINE III SHORT PEN  Use one per day     INSULIN SYRINGE .5CC/31GX5/16" 31G X 5/16" 0.5 ML Misc  USE ONE SYRINGE AS DIRECTED THREE TO FOUR TIMES DAILY     ketoconazole 2 % cream  Commonly known as:  NIZORAL  Apply 1 application topically daily.     LANTUS SOLOSTAR 100 UNIT/ML Solostar Pen  Generic drug:  Insulin Glargine  INJECT 60 UNITS SUBCUTANEOUSLY ONCE DAILY     loratadine 10 MG tablet  Commonly known as:  CLARITIN  Take 10 mg by mouth daily.     multivitamin tablet  Take 1 tablet by mouth daily.     NOVOLOG FLEXPEN 100  UNIT/ML FlexPen  Generic drug:  insulin aspart  Inject 5-10 Units into the skin 3 (three) times daily with meals.     ONETOUCH  VERIO test strip  Generic drug:  glucose blood  USE TO TEST BLOOD SUGAR FOUR TIMES DAILY     simvastatin 20 MG tablet  Commonly known as:  ZOCOR  Take 0.5 tablets (10 mg total) by mouth at bedtime.     tamsulosin 0.4 MG Caps capsule  Commonly known as:  FLOMAX  Take 1 capsule (0.4 mg total) by mouth daily.           Objective:   Physical Exam BP 122/76 mmHg  Pulse 62  Temp(Src) 98.2 F (36.8 C) (Oral)  Ht  (1.803 m)  Wt 217 lb 4 oz (98.544 kg)  BMI 30.31 kg/m2  SpO2 97% General:   Well developed, well nourished . NAD.  HEENT:  Normocephalic . Face symmetric, atraumatic. Neck: No thyromegaly. Ears: Normal, no redness, discharge or swelling Lungs:  CTA B Normal respiratory effort, no intercostal retractions, no accessory muscle use. Heart: RRR,  no murmur.  no pretibial edema bilaterally , good pedal pulses. Abdomen:  Not distended, soft, non-tender. No rebound or rigidity.  diastasis recti  noted, has a small reducible umbilical hernia Skin: Not pale. Not jaundice. Lower extremity skin normal. Penile skin wnl Neurologic:  alert & oriented X3.  Speech normal, gait appropriate for age and unassisted Psych--  Cognition and judgment appear intact.  Cooperative with normal attention span and concentration.  Behavior appropriate. No anxious or depressed appearing.    Assessment & Plan:   Assessment DM, w/ neuropathy HTN Hyperlipidemia Atrial fibrillation, 2011, s/p ablation, now sinus rhythm, anticoagulated BPH s/p TUNA ED S/P shingles, postherpetic neuralgia R foot  PLAN DM neuropathy: Under excellent control, recommend to go back to gabapentin 3 times a day for neuropathy. Occ penis glans get red, likely balanitis, recommend nizoral  as needed. HTN: BPs normal on no medications Hyperlipidemia: Continue simvastatin, last FLP  excellent A fibrillation: Seems to be in sinus rhythm today, continue anticoagulation per cards BPH: No symptom, DRE normal, checking a PSA "Muscle weakness", some fatigue --check a testost per pt request RTC 8 months

## 2015-08-12 NOTE — Progress Notes (Signed)
Pre visit review using our clinic review tool, if applicable. No additional management support is needed unless otherwise documented below in the visit note. 

## 2015-08-12 NOTE — Telephone Encounter (Signed)
DPR faxed to Dr. Urban Gibson office at [phone]. DPR sent for scanning.

## 2015-08-12 NOTE — Telephone Encounter (Signed)
Received fax confirmation on 08/12/2015 at 0901.

## 2015-08-12 NOTE — Patient Instructions (Addendum)
GO TO THE LAB : Get the blood work    GO TO THE FRONT DESK  Schedule a routine office visit or check up to be done in  8 months  Please be fasting    Use the cream as needed for now is at the genital area   Go back on gabapentin 3 times a day     Diabetes and Foot Care Diabetes may cause you to have problems because of poor blood supply (circulation) to your feet and legs. This may cause the skin on your feet to become thinner, break easier, and heal more slowly. Your skin may become dry, and the skin may peel and crack. You may also have nerve damage in your legs and feet causing decreased feeling in them. You may not notice minor injuries to your feet that could lead to infections or more serious problems. Taking care of your feet is one of the most important things you can do for yourself.  HOME CARE INSTRUCTIONS  Wear shoes at all times, even in the house. Do not go barefoot. Bare feet are easily injured.  Check your feet daily for blisters, cuts, and redness. If you cannot see the bottom of your feet, use a mirror or ask someone for help.  Wash your feet with warm water (do not use hot water) and mild soap. Then pat your feet and the areas between your toes until they are completely dry. Do not soak your feet as this can dry your skin.  Apply a moisturizing lotion or petroleum jelly (that does not contain alcohol and is unscented) to the skin on your feet and to dry, brittle toenails. Do not apply lotion between your toes.  Trim your toenails straight across. Do not dig under them or around the cuticle. File the edges of your nails with an emery board or nail file.  Do not cut corns or calluses or try to remove them with medicine.  Wear clean socks or stockings every day. Make sure they are not too tight. Do not wear knee-high stockings since they may decrease blood flow to your legs.  Wear shoes that fit properly and have enough cushioning. To break in new shoes, wear them for  just a few hours a day. This prevents you from injuring your feet. Always look in your shoes before you put them on to be sure there are no objects inside.  Do not cross your legs. This may decrease the blood flow to your feet.  If you find a minor scrape, cut, or break in the skin on your feet, keep it and the skin around it clean and dry. These areas may be cleansed with mild soap and water. Do not cleanse the area with peroxide, alcohol, or iodine.  When you remove an adhesive bandage, be sure not to damage the skin around it.  If you have a wound, look at it several times a day to make sure it is healing.  Do not use heating pads or hot water bottles. They may burn your skin. If you have lost feeling in your feet or legs, you may not know it is happening until it is too late.  Make sure your health care provider performs a complete foot exam at least annually or more often if you have foot problems. Report any cuts, sores, or bruises to your health care provider immediately. SEEK MEDICAL CARE IF:   You have an injury that is not healing.  You have cuts  or breaks in the skin.  You have an ingrown nail.  You notice redness on your legs or feet.  You feel burning or tingling in your legs or feet.  You have pain or cramps in your legs and feet.  Your legs or feet are numb.  Your feet always feel cold. SEEK IMMEDIATE MEDICAL CARE IF:   There is increasing redness, swelling, or pain in or around a wound.  There is a red line that goes up your leg.  Pus is coming from a wound.  You develop a fever or as directed by your health care provider.  You notice a bad smell coming from an ulcer or wound.   This information is not intended to replace advice given to you by your health care provider. Make sure you discuss any questions you have with your health care provider.   Document Released: 06/09/2000 Document Revised: 02/12/2013 Document Reviewed: 11/19/2012 Elsevier Interactive  Patient Education [employer]

## 2015-08-12 NOTE — Assessment & Plan Note (Addendum)
Tdap 2012, pnm shot 2015; prevnar 07-2015 Had a flu shot at work   Had cscopes w/  Dr Chales Abrahams in [place] before, had a cscope 2014, pt reports ok, no documentation  Prostate cancer screening, DRE wnl today check a   PSA   Labs: TSH, PSA, HIV Diet-exercise - discussed

## 2015-08-16 LAB — TESTOS,TOTAL,FREE AND SHBG (FEMALE)
Sex Hormone Binding Glob.: 29 nmol/L (ref 22–77)
Testosterone, Free: 44 pg/mL (ref 35.0–155.0)
Testosterone,Total,LC/MS/MS: 328 ng/dL (ref 250–1100)

## 2015-08-19 NOTE — Telephone Encounter (Signed)
Received records, placed in red folder.

## 2015-08-20 ENCOUNTER — Ambulatory Visit: Payer: BC Managed Care – PPO | Admitting: Cardiology

## 2015-08-20 NOTE — Telephone Encounter (Signed)
Colonoscopy 2009--- 1 polyp, hyperplastic Colonoscopy 6 - 2014: No polyps.  Plan--Next 10 years

## 2015-08-23 ENCOUNTER — Encounter: Payer: Self-pay | Admitting: Internal Medicine

## 2015-08-30 ENCOUNTER — Telehealth: Payer: Self-pay | Admitting: Internal Medicine

## 2015-08-30 NOTE — Telephone Encounter (Signed)
Relation to pt:sef Call back number:[phone]   Reason for call:  Patient inquiring about prostate/lab results

## 2015-08-30 NOTE — Telephone Encounter (Signed)
Left message for pt to call back  °

## 2015-09-01 NOTE — Telephone Encounter (Signed)
Notes Recorded by Conrad BurlingtonKaylyn Mialee Weyman, CMA on 08/18/2015 at 4:41 PM Results released to MyChart. Notes Recorded by Wanda PlumpJose E Paz, MD on 08/18/2015 at 4:35 PM Release these results to MyChart with attached comments  Danny [name], your testosterone levels are within normal, all the other tests are normal as well. Good results! Continue taking the same medications

## 2015-09-09 ENCOUNTER — Other Ambulatory Visit: Payer: Self-pay | Admitting: Internal Medicine

## 2015-09-16 ENCOUNTER — Other Ambulatory Visit (INDEPENDENT_AMBULATORY_CARE_PROVIDER_SITE_OTHER): Payer: BC Managed Care – PPO

## 2015-09-16 DIAGNOSIS — E119 Type 2 diabetes mellitus without complications: Secondary | ICD-10-CM

## 2015-09-16 DIAGNOSIS — Z794 Long term (current) use of insulin: Secondary | ICD-10-CM | POA: Diagnosis not present

## 2015-09-16 LAB — COMPREHENSIVE METABOLIC PANEL
ALK PHOS: 71 U/L (ref 39–117)
ALT: 35 U/L (ref 0–53)
AST: 25 U/L (ref 0–37)
Albumin: 4.2 g/dL (ref 3.5–5.2)
BILIRUBIN TOTAL: 0.3 mg/dL (ref 0.2–1.2)
BUN: 15 mg/dL (ref 6–23)
CALCIUM: 9.7 mg/dL (ref 8.4–10.5)
CO2: 32 mEq/L (ref 19–32)
Chloride: 103 mEq/L (ref 96–112)
Creatinine, Ser: 0.96 mg/dL (ref 0.40–1.50)
GFR: 85.91 mL/min (ref >60.00)
Glucose, Bld: 71 mg/dL (ref 70–99)
POTASSIUM: 4.9 meq/L (ref 3.5–5.1)
Sodium: 140 mEq/L (ref 135–145)
TOTAL PROTEIN: 7.4 g/dL (ref 6.0–8.3)

## 2015-09-16 LAB — MICROALBUMIN / CREATININE URINE RATIO
CREATININE, U: 88.9 mg/dL
MICROALB/CREAT RATIO: 0.8 mg/g (ref 0.0–30.0)
Microalb, Ur: <0.7 mg/dL (ref 0.0–1.9)

## 2015-09-16 LAB — HEMOGLOBIN A1C: HEMOGLOBIN A1C: 5.8 % (ref 4.6–6.5)

## 2015-09-22 ENCOUNTER — Ambulatory Visit (INDEPENDENT_AMBULATORY_CARE_PROVIDER_SITE_OTHER): Payer: BC Managed Care – PPO | Admitting: Endocrinology

## 2015-09-22 VITALS — BP 124/64 | HR 67 | Temp 98.2°F | Resp 14 | Ht 73.0 in | Wt 220.4 lb

## 2015-09-22 DIAGNOSIS — Z794 Long term (current) use of insulin: Secondary | ICD-10-CM

## 2015-09-22 DIAGNOSIS — I10 Essential (primary) hypertension: Secondary | ICD-10-CM

## 2015-09-22 DIAGNOSIS — E119 Type 2 diabetes mellitus without complications: Secondary | ICD-10-CM | POA: Diagnosis not present

## 2015-09-22 NOTE — Patient Instructions (Signed)
Reduce Tresiba to 56

## 2015-09-22 NOTE — Progress Notes (Signed)
Patient ID: Danny [name], male   DOB: June 21, 1961, 55 y.o.   MRN: [id number]     Reason for Appointment: Followup for Type 2 Diabetes  Referring physician: Drue Novel  History of Present Illness:          Diagnosis: Type 2 diabetes mellitus, date of diagnosis: 1992       Past history:  He has had long-standing diabetes and previously was treated with metformin, Amaryl, Januvia and Actos He was taken off metformin because of fear of liver problems.  May have been taken taken off Actos because of swelling of his legs but was on 45 mg at that time Over the last 2 years at least his A1c has been consistently over 8% He was started on insulin in 2011 probably when his A1c was over 9% Initially was given Lantus and Humalog and subsequently switched to 70/30 twice a day for unknown reasons Has not taken Byetta or Victoza in the past He had been on 70/30 insulin initially for a few years Previously A1c was 8% or more consistently before starting Invokamet.  Recent history:   INSULIN regimen is described as:  Tresiba 60 units at 9 pm Novolog 5-8 actid  His blood sugars are continuing to be well controlled with A1c 5.8 again He was switched from Lantus to [place] in 1/17 Also because of insurance preference he is now taking Xigduo instead of Invokamet    Current blood sugar patterns and problems identified:   He has continued the same dose of Tresiba as Lantus  With the new insulin his morning sugars are less fluctuating, his FASTING readings are mostly below 190 does not report overnight hypoglycemia  However he thinks that every other day he may feel a little hypoglycemic before lunch or dinner and will have to eat some candy.  As discussed on the last visit he is trying to adjust his Novolog based on his planned activity at breakfast and lunch and some based on his diet in the evening  Has only one reading the 60s documented for hypoglycemia  Only rarely will have a blood  sugar over 140 in the evenings and last one was with eating pizza  Postprandial readings after supper are in the normal range  Still taking relatively small amounts of Novolog, usually 5 units for breakfast and lunch less planning to be very active  Even though he is relatively actively has gained again 3 pounds       Oral hypoglycemic drugs the patient is taking are: Xigduo, 10/998, 2 tablets daily    Side effects from medications have been: None  Compliance with the medical regimen: Good  Glucose monitoring:  done 3-4 times a day         Glucometer: One Touch.      Blood Glucose readings    Mean values apply above for all meters except median for One Touch  PRE-MEAL Fasting Lunch Dinner Bedtime Overall  Glucose range: 88-132  63-144  79-127  86-159    Mean/median: 97  88  87  92  92    Glycemic control:   Lab Results  Component Value Date   HGBA1C 5.8 09/16/2015   HGBA1C 5.8 06/07/2015   HGBA1C 5.6 02/08/2015   Lab Results  Component Value Date   MICROALBUR <0.7 09/16/2015   LDLCALC 34 06/07/2015   CREATININE 0.96 09/16/2015    Retinal exam: annual    Self-care:  Meals: 3 meals per day. Breakfast is eggs  or cereal usually,  controlling portions      Dietician visit: Most recent: years ago.    Exercise:  he is active with moving equipment at work and some walking, does drive a truck                      Weight history:  Wt Readings from Last 3 Encounters:  09/22/15 220 lb 6.4 oz (99.973 kg)  08/12/15 217 lb 4 oz (98.544 kg)  07/13/15 218 lb 8 oz (99.111 kg)       Medication List       This list is accurate as of: 09/22/15  9:23 PM.  Always use your most recent med list.               apixaban 5 MG Tabs tablet  Commonly known as:  ELIQUIS  Take 1 tablet (5 mg total) by mouth 2 (two) times daily.     azelastine 0.1 % nasal spray  Commonly known as:  ASTELIN  Place 2 sprays into both nostrils at bedtime as needed for rhinitis. Use in each nostril as  directed     Dapagliflozin-Metformin HCl ER 10-998 MG Tb24  Commonly known as:  XIGDUO XR  Take 5-1,000 mg by mouth 2 (two) times daily.     fish oil-omega-3 fatty acids 1000 MG capsule  Take 2 g by mouth 2 (two) times daily.     fluticasone 50 MCG/ACT nasal spray  Commonly known as:  FLONASE  Place 2 sprays into both nostrils daily.     gabapentin 600 MG tablet  Commonly known as:  NEURONTIN  Take 1 tablet (600 mg total) by mouth 3 (three) times daily.     glucagon 1 MG injection  Commonly known as:  GLUCAGON EMERGENCY  Inject 1 mg into the vein once as needed.     Insulin Degludec 100 UNIT/ML Sopn  Commonly known as:  TRESIBA FLEXTOUCH  Inject 60 Units into the skin daily.     Insulin Pen Needle 31G X 8 MM Misc  Commonly known as:  B-D ULTRAFINE III SHORT PEN  Use one per day     INSULIN SYRINGE .5CC/31GX5/16" 31G X 5/16" 0.5 ML Misc  USE ONE SYRINGE AS DIRECTED THREE TO FOUR TIMES DAILY     ketoconazole 2 % cream  Commonly known as:  NIZORAL  Apply 1 application topically daily.     loratadine 10 MG tablet  Commonly known as:  CLARITIN  Take 10 mg by mouth daily.     multivitamin tablet  Take 1 tablet by mouth daily.     NOVOLOG FLEXPEN 100 UNIT/ML FlexPen  Generic drug:  insulin aspart  Inject 5-10 Units into the skin 3 (three) times daily with meals.     ONETOUCH VERIO test strip  Generic drug:  glucose blood  USE TO TEST BLOOD SUGAR FOUR TIMES DAILY     simvastatin 20 MG tablet  Commonly known as:  ZOCOR  Take 0.5 tablets (10 mg total) by mouth at bedtime.     tamsulosin 0.4 MG Caps capsule  Commonly known as:  FLOMAX  Take 1 capsule (0.4 mg total) by mouth daily.        Allergies:  No Known Allergies  Past Medical History  Diagnosis Date  . Diabetes mellitus     Type II dx 1999  . Hyperlipidemia   . Hypertension   . Benign prostatic hypertrophy     s/p TUNA procedure  aprx 2006,  . Carpal tunnel syndrome 06/17/2009  . HYPERSOMNIA  UNSPECIFIED 12/11/2007  . INSOMNIA-SLEEP DISORDER-UNSPEC 04/12/2010  . Allergic rhinitis   . History of cardiovascular stress test 8/11    Negative  . ED (erectile dysfunction)     After TUNA  . Elevated LFTs 2010    (-) chronic hep  panel  . Atrial fibrillation (HCC) 12/21/2009    paroxysmal. Failed Multaq  . DM type 2 with diabetic peripheral neuropathy (HCC) 10/15/2014    Past Surgical History  Procedure Laterality Date  . Vasectomy      several years ago  . Prostate surgery      TUNA  . Cystoscopy  11-09    neg  @ urology, was rx Flomax  . Tee without cardioversion N/A 03/23/2014    Procedure: TRANSESOPHAGEAL ECHOCARDIOGRAM (TEE);  Surgeon: Pricilla Riffle, MD;  Location: [facility] ENDOSCOPY;  Service: Cardiovascular;  Laterality: N/A;  . Ablation  03-24-14    PVI by Dr Johney Frame  . Atrial fibrillation ablation N/A 03/24/2014    Procedure: ATRIAL FIBRILLATION ABLATION;  Surgeon: Gardiner Rhyme, MD;  Location: [facility] CATH LAB;  Service: Cardiovascular;  Laterality: N/A;    Family History  Problem Relation Age of Onset  . Heart disease Mother     M-- MI,CHF  . Diabetes Mother   . Hypertension Mother   . Hypertension Father   . Cancer Neg Hx     Negative FH of Colon or Prostate Cancer    Social History:  reports that he quit smoking about 30 years ago. His smoking use included Cigarettes. He has never used smokeless tobacco. He reports that he does not drink alcohol or use illicit drugs.    Review of Systems       Lipids: He has been on low dose simvastatin for a few years, LDL excellent, HDL tends to be low       Lab Results  Component Value Date   CHOL 92 06/07/2015   HDL 33.00* 06/07/2015   LDLCALC 34 06/07/2015   TRIG 126.0 06/07/2015   CHOLHDL 3 06/07/2015               Hypertension: Currently not on any medications except low-dose metoprolol, he says blood pressure is usually good at home Initial blood pressure was high in the office today       For several years has a  history of Numbness, tingling  in feet and lower legs especially the right side, better now.  Relieved by gabapentin 3 times a day.     LABS:  Lab on 09/16/2015  Component Date Value Ref Range Status  . Hgb A1c MFr Bld 09/16/2015 5.8  4.6 - 6.5 % Final   Glycemic Control Guidelines for People with Diabetes:Non Diabetic:  <6%Goal of Therapy: <7%Additional Action Suggested:  >8%   . Sodium 09/16/2015 140  135 - 145 mEq/L Final  . Potassium 09/16/2015 4.9  3.5 - 5.1 mEq/L Final  . Chloride 09/16/2015 103  96 - 112 mEq/L Final  . CO2 09/16/2015 32  19 - 32 mEq/L Final  . Glucose, Bld 09/16/2015 71  70 - 99 mg/dL Final  . BUN 46/96/2952 15  6 - 23 mg/dL Final  . Creatinine, Ser 09/16/2015 0.96  0.40 - 1.50 mg/dL Final  . Total Bilirubin 09/16/2015 0.3  0.2 - 1.2 mg/dL Final  . Alkaline Phosphatase 09/16/2015 71  39 - 117 U/L Final  . AST 09/16/2015 25  0 - 37  U/L Final  . ALT 09/16/2015 35  0 - 53 U/L Final  . Total Protein 09/16/2015 7.4  6.0 - 8.3 g/dL Final  . Albumin 12/24/1599 4.2  3.5 - 5.2 g/dL Final  . Calcium 09/32/3557 9.7  8.4 - 10.5 mg/dL Final  . GFR 32/20/2542 85.91  >60.00 mL/min Final  . Microalb, Ur 09/16/2015 <0.7  0.0 - 1.9 mg/dL Final  . Creatinine,U 70/62/3762 88.9   Final  . Microalb Creat Ratio 09/16/2015 0.8  0.0 - 30.0 mg/g Final    Physical Examination:  BP 124/64 mmHg  Pulse 67  Temp(Src) 98.2 F (36.8 C)  Resp 14  Ht 6\' 1"  (1.854 m)  Wt 220 lb 6.4 oz (99.973 kg)  BMI 29.08 kg/m2  SpO2 93%   ASSESSMENT/PLAN:    Diabetes type 2  with BMI 29 on insulin See history of present illness for detailed discussion of current diabetes management, blood sugar patterns and problems identified His A1c is quite stable under 6% Blood sugars are excellent at home throughout the day This is probably benefiting from [place] compared to Lantus with lower variability and less hypoglycemia overall However he claims he is having symptoms of hypoglycemia every other  day especially when more active This is despite discussing adjustment of mealtime doses based on his planned activity and meal size  Overall doing somewhat better with his diet but weight has not come down Most likely he is getting a larger than required dose of basal insulin with his fasting blood sugars averaging only 97  Recommendations today:  Trial of 56 units of Tresiba instead of 60 as he likely is getting excessive basal insulin, and discussed fasting blood sugar targets  Discussed blood sugar targets both fasting and postprandial  He may be able to get less hypoglycemia during the day with reducing [place] but also may need to reduce mealtime coverage especially when more active after breakfast and lunch  May need midmorning or midafternoon snack when more active  Follow-up in 4 months  History of hyperlipidemia: Well controlled except for low HDL  Patient Instructions  Reduce Tresiba to 56     Counseling time on subjects discussed above is over 50% of today's 25 minute visit   Merri Dimaano 09/22/2015, 9:23 PM   Note: This office note was prepared with Insurance underwriter. Any transcriptional errors that result from this process are unintentional. .

## 2015-11-02 ENCOUNTER — Other Ambulatory Visit: Payer: Self-pay | Admitting: Endocrinology

## 2015-11-27 ENCOUNTER — Other Ambulatory Visit: Payer: Self-pay | Admitting: Endocrinology

## 2015-12-24 ENCOUNTER — Other Ambulatory Visit: Payer: Self-pay | Admitting: Internal Medicine

## 2015-12-27 NOTE — Telephone Encounter (Signed)
Refill sent per [facility] refill protocol/SLS  

## 2016-01-07 ENCOUNTER — Other Ambulatory Visit: Payer: Self-pay | Admitting: Internal Medicine

## 2016-01-18 ENCOUNTER — Other Ambulatory Visit (INDEPENDENT_AMBULATORY_CARE_PROVIDER_SITE_OTHER): Payer: BC Managed Care – PPO

## 2016-01-18 DIAGNOSIS — Z794 Long term (current) use of insulin: Secondary | ICD-10-CM | POA: Diagnosis not present

## 2016-01-18 DIAGNOSIS — E119 Type 2 diabetes mellitus without complications: Secondary | ICD-10-CM

## 2016-01-18 LAB — BASIC METABOLIC PANEL
BUN: 16 mg/dL (ref 6–23)
CALCIUM: 9.1 mg/dL (ref 8.4–10.5)
CO2: 28 mEq/L (ref 19–32)
CREATININE: 0.93 mg/dL (ref 0.40–1.50)
Chloride: 104 mEq/L (ref 96–112)
GFR: 89.01 mL/min (ref >60.00)
Glucose, Bld: 127 mg/dL — ABNORMAL HIGH (ref 70–99)
Potassium: 3.6 mEq/L (ref 3.5–5.1)
Sodium: 140 mEq/L (ref 135–145)

## 2016-01-18 LAB — HEMOGLOBIN A1C: Hgb A1c MFr Bld: 5.7 % (ref 4.6–6.5)

## 2016-01-21 ENCOUNTER — Encounter: Payer: Self-pay | Admitting: Endocrinology

## 2016-01-21 ENCOUNTER — Ambulatory Visit (INDEPENDENT_AMBULATORY_CARE_PROVIDER_SITE_OTHER): Payer: BC Managed Care – PPO | Admitting: Endocrinology

## 2016-01-21 ENCOUNTER — Ambulatory Visit (INDEPENDENT_AMBULATORY_CARE_PROVIDER_SITE_OTHER): Payer: BC Managed Care – PPO | Admitting: Physician Assistant

## 2016-01-21 ENCOUNTER — Other Ambulatory Visit: Payer: Self-pay | Admitting: Cardiology

## 2016-01-21 ENCOUNTER — Encounter: Payer: Self-pay | Admitting: Physician Assistant

## 2016-01-21 VITALS — BP 126/74 | HR 74 | Ht 71.0 in | Wt 214.2 lb

## 2016-01-21 VITALS — BP 124/78 | HR 74 | Wt 214.0 lb

## 2016-01-21 DIAGNOSIS — I10 Essential (primary) hypertension: Secondary | ICD-10-CM

## 2016-01-21 DIAGNOSIS — E1142 Type 2 diabetes mellitus with diabetic polyneuropathy: Secondary | ICD-10-CM | POA: Diagnosis not present

## 2016-01-21 DIAGNOSIS — I480 Paroxysmal atrial fibrillation: Secondary | ICD-10-CM | POA: Diagnosis not present

## 2016-01-21 DIAGNOSIS — E119 Type 2 diabetes mellitus without complications: Secondary | ICD-10-CM | POA: Diagnosis not present

## 2016-01-21 DIAGNOSIS — E785 Hyperlipidemia, unspecified: Secondary | ICD-10-CM | POA: Diagnosis not present

## 2016-01-21 DIAGNOSIS — E669 Obesity, unspecified: Secondary | ICD-10-CM | POA: Insufficient documentation

## 2016-01-21 NOTE — Progress Notes (Signed)
Patient ID: Danny [name], male   DOB: [birth year], 55 y.o.   MRN: [id number]     Reason for Appointment: Followup for Type 2 Diabetes  Referring physician: Drue Novel  History of Present Illness:          Diagnosis: Type 2 diabetes mellitus, date of diagnosis: 1992       Past history:  He has had long-standing diabetes and previously was treated with metformin, Amaryl, Januvia and Actos He was taken off metformin because of fear of liver problems.  May have been taken taken off Actos because of swelling of his legs but was on 45 mg at that time Over the last 2 years at least his A1c has been consistently over 8% He was started on insulin in 2011 probably when his A1c was over 9% Initially was given Lantus and Humalog and subsequently switched to 70/30 twice a day for unknown reasons Has not taken Byetta or Victoza in the past He had been on 70/30 insulin initially for a few years Previously A1c was 8% or more consistently before starting Invokamet. He was switched from Lantus to [place] in 1/17  Recent history:   INSULIN regimen is described as:  Tresiba 56 units at 9 pm Novolog 5-6 ac bid-tid Oral hypoglycemic drugs the patient is taking are: Xigduo, 10/998, 2 tablets daily  His blood sugars are continuing to be well controlled with A1c 5.7, previously 5.8     Current blood sugar patterns and problems identified:  His blood sugars appear to be still excellent and most of his insulin his basal  He is checking blood sugars fairly regularly nearly 3 times a day  LOWEST blood sugars around lunchtime and generally better during the day because of increase activity  He usually does not take mealtime coverage at lunch because of his increased activity  Blood sugars do not go up after breakfast or supper much with using only small amounts of Novolog, usually 5-6 units; occasionally at supper may take 8 units  He has lost weight and he thinks this is from overall being more  active this summer  Again benefiting from SGLT 2 drugs           Side effects from medications have been: None  Compliance with the medical regimen: Good  Glucose monitoring:  done 3-4 times a day         Glucometer: One Touch.      Blood Glucose readings    Mean values apply above for all meters except median for One Touch  PRE-MEAL Fasting Lunch Dinner Bedtime Overall  Glucose range: 88-141  64-175  68-168     Mean/median:  90  101   103+/-22    POST-MEAL PC Breakfast PC Lunch PC Dinner  Glucose range:   85-143   Mean/median:   101      Glycemic control:   Lab Results  Component Value Date   HGBA1C 5.7 01/18/2016   HGBA1C 5.8 09/16/2015   HGBA1C 5.8 06/07/2015   Lab Results  Component Value Date   MICROALBUR <0.7 09/16/2015   LDLCALC 34 06/07/2015   CREATININE 0.93 01/18/2016    Retinal exam: annual    Self-care:  Meals: 3 meals per day. Breakfast is eggs or cereal usually,  controlling portions      Dietician visit: Most recent: years ago.    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 Hgb A1c MFr Bld 01/18/2016 5.7  4.6 - 6.5 % Final  . Sodium 01/18/2016 140  135 - 145 mEq/L Final  . Potassium 01/18/2016 3.6  3.5 - 5.1 mEq/L Final  . Chloride 01/18/2016 104  96 - 112 mEq/L Final  . CO2 01/18/2016 28  19 - 32 mEq/L Final  . Glucose, Bld 01/18/2016 127* 70 - 99 mg/dL Final  . BUN 14/78/2956 16  6 - 23 mg/dL Final  . Creatinine, Ser 01/18/2016 0.93  0.40 - 1.50 mg/dL Final  . Calcium 21/30/8657 9.1  8.4 - 10.5 mg/dL Final  . GFR 84/69/6295 89.01  >60.00 mL/min Final      Medication List       Accurate as of 01/21/16 11:59 PM. Always use your most recent med list.           azelastine 0.1 % nasal spray Commonly known as:  ASTELIN PLACE 2 SPRAYS INTO BOTH NOSTRILS EVERY NIGHT AT BEDTIME AS NEEDED FOR RHINITIS   ELIQUIS 5 MG Tabs tablet Generic drug:  apixaban TAKE 1 TABLET(5 MG) BY MOUTH TWICE DAILY   fish oil-omega-3 fatty acids 1000 MG capsule Take 2 g by mouth 2 (two) times daily.   fluticasone 50 MCG/ACT nasal spray Commonly known as:  FLONASE Place 2 sprays into both nostrils daily.   gabapentin 600 MG tablet Commonly known as:  NEURONTIN Take 1 tablet (600 mg total) by mouth 3 (three) times daily.   glucagon 1 MG injection Commonly known as:  GLUCAGON EMERGENCY Inject 1 mg into the vein once as needed.   Insulin Pen Needle 31G X 8 MM Misc Commonly known as:  B-D ULTRAFINE III SHORT PEN Use one per day   INSULIN SYRINGE .5CC/31GX5/16" 31G X 5/16" 0.5 ML Misc USE ONE SYRINGE AS DIRECTED THREE TO FOUR TIMES DAILY   ketoconazole 2 % cream Commonly known as:  NIZORAL Apply 1 application topically daily.   loratadine 10 MG tablet Commonly known as:  CLARITIN Take 10 mg by mouth daily.   multivitamin tablet Take 1 tablet by mouth daily.   NOVOLOG FLEXPEN 100 UNIT/ML FlexPen Generic drug:  insulin aspart Inject 5-10 Units into the skin 3 (three) times daily with meals.   ONETOUCH VERIO test strip Generic drug:  glucose blood USE TO TEST BLOOD SUGAR FOUR TIMES DAILY   simvastatin 20 MG tablet Commonly known as:  ZOCOR Take 0.5 tablets (10 mg total) by mouth at bedtime.   tamsulosin 0.4 MG Caps capsule Commonly known as:  FLOMAX Take 1 capsule (0.4 mg total) by mouth daily.   TRESIBA FLEXTOUCH 100 UNIT/ML Sopn Generic drug:  Insulin Degludec INJECT 60 UNITS SUBCUTANEOUSLY ONCE DAILY   XIGDUO XR 10-998 MG Tb24 Generic drug:  Dapagliflozin-Metformin HCl ER TAKE ONE TABLET BY MOUTH TWICE DAILY. THIS  REPLACES INVOKAMET XR.       Allergies:  No Known Allergies  Past Medical History:  Diagnosis Date  . Allergic  rhinitis   . Benign prostatic hypertrophy    s/p TUNA procedure aprx 2006,  . Carpal tunnel syndrome 06/17/2009  . Diabetes mellitus    Type II dx 1999  . 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 ED (erectile  dysfunction)    After TUNA  . Elevated LFTs 2010   (-) chronic hep  panel  . History of cardiovascular stress test 8/11   Negative  . Hyperlipidemia   . HYPERSOMNIA UNSPECIFIED 12/11/2007  . Hypertension   . INSOMNIA-SLEEP DISORDER-UNSPEC 04/12/2010  . Obesity   . PAF (paroxysmal atrial fibrillation) (HCC) 12/21/2009   a. Failed Multaq. b. Prev on flecainide. c. s/p afib ablation 2015.    Past Surgical History:  Procedure Laterality Date  . ABLATION  03-24-14   PVI by Dr Johney Frame  . ATRIAL FIBRILLATION ABLATION N/A 03/24/2014   Procedure: ATRIAL FIBRILLATION ABLATION;  Surgeon: Gardiner Rhyme, MD;  Location: [facility] CATH LAB;  Service: Cardiovascular;  Laterality: N/A;  . CYSTOSCOPY  11-09   neg  @ urology, was rx Flomax  . PROSTATE SURGERY     TUNA  . TEE WITHOUT CARDIOVERSION N/A 03/23/2014   Procedure: TRANSESOPHAGEAL ECHOCARDIOGRAM (TEE);  Surgeon: Pricilla Riffle, MD;  Location: [facility] ENDOSCOPY;  Service: Cardiovascular;  Laterality: N/A;  . VASECTOMY     several years ago    Family History  Problem Relation Age of Onset  . Heart disease Mother     M-- MI,CHF  . Diabetes Mother   . Hypertension Mother   . Hypertension Father   . Cancer Neg Hx     Negative FH of Colon or Prostate Cancer    Social History:  reports that he quit smoking about 30 years ago. His smoking use included Cigarettes. He has never used smokeless tobacco. He reports that he does not drink alcohol or use drugs.    Review of Systems       Lipids: He has been on low dose simvastatin for a few years, LDL excellent, HDL tends to be low       Lab Results  Component Value Date   CHOL 92 06/07/2015   HDL 33.00 (L) 06/07/2015   LDLCALC 34 06/07/2015   TRIG 126.0 06/07/2015    CHOLHDL 3 06/07/2015              Currently not on ACE inhibitor       For several years has a history of Numbness, tingling  in feet and lower legs especially the right side, better now.  Relieved by gabapentin 2 times a day, previously taking higher doses.  Currently not having any significant numbness and sensory exam is normal   LABS:  Lab on 01/18/2016  Component Date Value Ref Range Status  . Hgb A1c MFr Bld 01/18/2016 5.7  4.6 - 6.5 % Final  . Sodium 01/18/2016 140  135 - 145 mEq/L Final  . Potassium 01/18/2016 3.6  3.5 - 5.1 mEq/L Final  . Chloride 01/18/2016 104  96 - 112 mEq/L Final  . CO2 01/18/2016 28  19 - 32 mEq/L Final  . Glucose, Bld 01/18/2016 127* 70 - 99 mg/dL Final  . BUN 43/32/9518 16  6 - 23 mg/dL Final  . Creatinine, Ser 01/18/2016 0.93  0.40 - 1.50 mg/dL Final  . Calcium 84/16/6063 9.1  8.4 - 10.5 mg/dL Final  . GFR 01/60/1093 89.01  >60.00 mL/min Final    Physical Examination:  BP 124/78 (BP Location: Left Arm, Patient Position: Sitting)   Pulse 74   Wt 214 lb (97.1 kg)   SpO2 95%   BMI 29.85 kg/m   Diabetic Foot Exam - Simple   Simple Foot Form Diabetic Foot exam was performed with the following findings:  Yes 01/21/2016  3:22 PM  Visual Inspection No deformities, no ulcerations, no other skin breakdown bilaterally:  Yes Sensation Testing Intact to touch and monofilament testing bilaterally:  Yes Pulse Check Posterior Tibialis and Dorsalis pulse intact bilaterally:  Yes Comments      ASSESSMENT/PLAN:    Diabetes type 2  with BMI 29 on insulin See history of present illness for detailed discussion of current diabetes management, blood sugar patterns and problems identified His A1c is quite stable under 6% This is with  regimen of mostly basal insulin and small amounts of mealtime insulin at breakfast and supper Fasting blood sugars are excellent with 56 units of Tresiba Lowest blood sugars are at lunchtime because of increased activity  but usually not getting hypoglycemia during the day with trying to get snacks when more active and avoiding lunchtime meal coverage  No side effects from [place] and renal function/potassium are stable Microalbumin has been normal Sensory exam not indicated peripheral sensory loss today  Recommendations today:  No change in insulin  May reduce checking blood sugars fasting  Again he can reduce Tresiba high fasting readings start getting lower  Check bottom of frequent regularly   Have a combination of protein/carbohydrate snacks when more active  Check lipids on the next visit  History of hyperlipidemia: Well controlled except for low HDL  There are no Patient Instructions on file for this visit.  Counseling time on subjects discussed above is over 50% of today's 25 minute visit  Brynlei Klausner 01/22/2016, 2:39 PM   Note: This office note was prepared with Dragon voice recognition system technology. Any transcriptional errors that result from this process are unintentional. .

## 2016-01-21 NOTE — Patient Instructions (Signed)
Medication Instructions:  Your physician recommends that you continue on your current medications as directed. Please refer to the Current Medication list given to you today.   Labwork: None ordered  Testing/Procedures: None ordered  Follow-Up: Your physician wants you to follow-up in: 6-8 months with Dr.Crenshaw You will receive a reminder letter in the mail two months in advance. If you don't receive a letter, please call our office to schedule the follow-up appointment.   Any Other Special Instructions Will Be Listed Below (If Applicable). You will ned to have a CBC lab in January 2018. Ok to have it done with your primary care physician if you will be seeing him around that time. If not please call our office to have your lab drawn here.     If you need a refill on your cardiac medications before your next appointment, please call your pharmacy.

## 2016-01-21 NOTE — Progress Notes (Signed)
Cardiology Office Note    Date:  01/21/2016  ID:  Danny [name], Danny [name] 1961/05/04, MRN [id number] PCP:  [name], MD  Cardiologist:  Jens Som  Chief Complaint: f/u afib  History of Present Illness:  Danny [name] is a 55 y.o. male with history of DM, HTN, HLD, BPH, paroxysmal atrial fib (failed Multaq, previously on flecainide, ablation of afib 02/2014) who presents for f/u. Last 2D echo 02/2014: normal - EF not given a number but reported at normal. Nuc stress test 01/2014: normal. He is on apixaban. Labs 12/2015: normal except glucose 127, A1C 5.7. CBC in 06/2015 was normal. LDL 05/2015 was 34.  He comes in today for routine f/u - last saw Dr. Jens Som 06/2015. Says he is feeling fabulous. No chest pain, SOB, syncope, or palpitations reminiscent of recurrent afib. Denies evidence of bleeding. He spents a lot of time outside. Sometimes on hot days after long periods on his feet he notices leg swelling - tends to occur after high sodium intake. This is not present by today's exam.  Past Medical History:  Diagnosis Date  . Allergic rhinitis   . Benign prostatic hypertrophy    s/p TUNA procedure aprx 2006,  . Carpal tunnel syndrome 06/17/2009  . Diabetes mellitus    Type II dx 1999  . 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 ED (erectile dysfunction)    After TUNA  . Elevated LFTs 2010   (-) chronic hep  panel  . History of cardiovascular stress test 8/11   Negative  . Hyperlipidemia   . HYPERSOMNIA UNSPECIFIED 12/11/2007  . Hypertension   . INSOMNIA-SLEEP DISORDER-UNSPEC 04/12/2010  . Obesity   . PAF (paroxysmal atrial fibrillation) (HCC) 12/21/2009   a. Failed Multaq. b. Prev on flecainide. c. s/p afib ablation 2015.    Past Surgical History:  Procedure Laterality Date  . ABLATION  03-24-14   PVI by Dr Johney Frame  . ATRIAL FIBRILLATION ABLATION N/A 03/24/2014   Procedure: ATRIAL FIBRILLATION ABLATION;  Surgeon: Gardiner Rhyme, MD;  Location: [facility] CATH [facility];   Service: Cardiovascular;  Laterality: N/A;  . CYSTOSCOPY  11-09   neg  @ urology, was rx Flomax  . PROSTATE SURGERY     TUNA  . TEE WITHOUT CARDIOVERSION N/A 03/23/2014   Procedure: TRANSESOPHAGEAL ECHOCARDIOGRAM (TEE);  Surgeon: Pricilla Riffle, MD;  Location: [facility]COPY;  Service: Cardiovascular;  Laterality: N/A;  . VASECTOMY     several years ago    Current Medications: Current Outpatient Prescriptions  Medication Sig Dispense Refill  . apixaban (ELIQUIS) 5 MG TABS tablet Take 1 tablet (5 mg total) by mouth 2 (two) times daily. 60 tablet 6  . azelastine (ASTELIN) 0.1 % nasal spray PLACE 2 SPRAYS INTO BOTH NOSTRILS EVERY NIGHT AT BEDTIME AS NEEDED FOR RHINITIS 30 mL 0  . fish oil-omega-3 fatty acids 1000 MG capsule Take 2 g by mouth 2 (two) times daily.      . fluticasone (FLONASE) 50 MCG/ACT nasal spray Place 2 sprays into both nostrils daily. 16 g 6  . gabapentin (NEURONTIN) 600 MG tablet Take 1 tablet (600 mg total) by mouth 3 (three) times daily. 90 tablet 6  . glucagon (GLUCAGON EMERGENCY) 1 MG injection Inject 1 mg into the vein once as needed. 1 each 5  . insulin aspart (NOVOLOG FLEXPEN) 100 UNIT/ML FlexPen Inject 5-10 Units into the skin 3 (three) times daily with meals.     . Insulin Pen Needle (B-D ULTRAFINE  III SHORT PEN) 31G X 8 MM MISC Use one per day 30 each 3  . Insulin Syringe-Needle U-100 (INSULIN SYRINGE .5CC/31GX5/16") 31G X 5/16" 0.5 ML MISC USE ONE SYRINGE AS DIRECTED THREE TO FOUR TIMES DAILY 100 each 3  . ketoconazole (NIZORAL) 2 % cream Apply 1 application topically daily. 30 g 0  . loratadine (CLARITIN) 10 MG tablet Take 10 mg by mouth daily.     . Multiple Vitamin (MULTIVITAMIN) tablet Take 1 tablet by mouth daily.    Letta Pate VERIO test strip USE TO TEST BLOOD SUGAR FOUR TIMES DAILY 125 each 5  . simvastatin (ZOCOR) 20 MG tablet Take 0.5 tablets (10 mg total) by mouth at bedtime. 15 tablet 3  . tamsulosin (FLOMAX) 0.4 MG CAPS capsule Take 1 capsule (0.4 mg  total) by mouth daily. 30 capsule 12  . TRESIBA FLEXTOUCH 100 UNIT/ML SOPN INJECT 60 UNITS SUBCUTANEOUSLY ONCE DAILY 15 mL 3  . XIGDUO XR 10-998 MG TB24 TAKE ONE TABLET BY MOUTH TWICE DAILY. THIS  REPLACES INVOKAMET XR. 60 tablet 3   No current facility-administered medications for this visit.      Allergies:   Review of patient's allergies indicates no known allergies.   Social History   Social History  . Marital status: Married    Spouse name: N/A  . Number of children: 2  . Years of education: N/A   Occupational History  . Works for [employer] (state[place]  Drives a Scientist, product/process development [employer]    works for [employer] (state)   Social History Main Topics  . Smoking status: Former Smoker    Types: Cigarettes    Quit date: 06/26/1985  . Smokeless tobacco: Never Used  . Alcohol use No     Comment:    . Drug use: No  . Sexual activity: Not Asked   Other Topics Concern  . None   Social History Narrative   has a girlfriend, lives in [place]              Family History:  The patient's family history includes Diabetes in his mother; Heart disease in his mother; Hypertension in his father and mother.   ROS:   Please see the history of present illness.  All other systems are reviewed and otherwise negative.    PHYSICAL EXAM:   VS:  BP 126/74   Pulse 74   Ht 5\' 11"  (1.803 m)   Wt 214 lb 3.2 oz (97.2 kg)   BMI 29.87 kg/m   BMI: Body mass index is 29.87 kg/m. GEN: Well nourished, well developed WM, in no acute distress HEENT: normocephalic, atraumatic Neck: no JVD, carotid bruits, or masses Cardiac: RRR; no murmurs, rubs, or gallops, no edema  Respiratory:  clear to auscultation bilaterally, normal work of breathing GI: soft, nontender, nondistended, + BS MS: no deformity or atrophy  Skin: warm and dry, no rash Neuro:  Alert and Oriented x 3, Strength and sensation are intact, follows commands Psych: euthymic mood, full affect  Wt Readings from Last 3 Encounters:  01/21/16 214 lb 3.2  oz (97.2 kg)  09/22/15 220 lb 6.4 oz (100 kg)  08/12/15 217 lb 4 oz (98.5 kg)      Studies/Labs Reviewed:   EKG:  EKG was ordered today and personally reviewed by me and demonstrates NSR 74bpm, no acute ST-T changes, TWI III, avF. No change from prior.  Recent Labs: 07/13/2015: Hemoglobin 16.3; Platelets 267 08/12/2015: TSH 1.96 09/16/2015: ALT 35 01/18/2016: BUN 16; Creatinine,  Ser 0.93; Potassium 3.6; Sodium 140   Lipid Panel    Component Value Date/Time   CHOL 92 06/07/2015 1547   TRIG 126.0 06/07/2015 1547   HDL 33.00 (L) 06/07/2015 1547   CHOLHDL 3 06/07/2015 1547   VLDL 25.2 06/07/2015 1547   LDLCALC 34 06/07/2015 1547    Additional studies/ records that were reviewed today include: Summarized above.    ASSESSMENT & PLAN:   1. Paroxysmal atrial fib - no recurrence following ablation. Continue observation for recurrent sx. Continue apixaban. We discussed that repeat CBC is due around December 2017/Jan 2018. I told him to let us know if he won't have this checked by primary care on/before that time. If that's the case, we can put in an order to have it checked here. Recent Cr was normal. 2. Essential HTN - controlled. Does not seem to be requiring any antihypertensives. Will defer consideration of initiation of low dose ACEI to his endocrinologist whom he is seeing today (given his diabetes). We discussed how increased sodium intake can contribute to LEE. 3. Hyperlipidemia - lipids have been followed by Dr. Lucianne Muss. Excellent LDL in December. 4. Obesity - he has lost 6lb since March. BMI is now below 30. He seems committed to a healthy lifestyle. We discussed relationship of keeping good control of weight as obesity can increase risk of recurrent afib.  Disposition: F/u with Dr. Jens Som in 6-8 months.   Medication Adjustments/Labs and Tests Ordered: Current medicines are reviewed at length with the patient today.  Concerns regarding medicines are outlined above. Medication  changes, Labs and Tests ordered today are summarized above and listed in the Patient Instructions accessible in Encounters.   Thomasene Mohair PA-C  01/21/2016 2:35 PM    [facility] [address] [place], [place], [place]  [address] Phone: [phone]; Fax: [phone]

## 2016-02-21 ENCOUNTER — Other Ambulatory Visit: Payer: Self-pay | Admitting: Endocrinology

## 2016-03-06 ENCOUNTER — Other Ambulatory Visit: Payer: Self-pay | Admitting: Endocrinology

## 2016-03-27 ENCOUNTER — Other Ambulatory Visit: Payer: Self-pay | Admitting: Endocrinology

## 2016-04-10 ENCOUNTER — Encounter: Payer: Self-pay | Admitting: Internal Medicine

## 2016-04-10 ENCOUNTER — Ambulatory Visit (INDEPENDENT_AMBULATORY_CARE_PROVIDER_SITE_OTHER): Payer: BC Managed Care – PPO | Admitting: Internal Medicine

## 2016-04-10 VITALS — BP 128/72 | HR 63 | Temp 97.7°F | Resp 14 | Ht 71.0 in | Wt 216.2 lb

## 2016-04-10 DIAGNOSIS — E1142 Type 2 diabetes mellitus with diabetic polyneuropathy: Secondary | ICD-10-CM

## 2016-04-10 DIAGNOSIS — R1031 Right lower quadrant pain: Secondary | ICD-10-CM

## 2016-04-10 DIAGNOSIS — I4891 Unspecified atrial fibrillation: Secondary | ICD-10-CM

## 2016-04-10 DIAGNOSIS — G8929 Other chronic pain: Secondary | ICD-10-CM

## 2016-04-10 LAB — CBC WITH DIFFERENTIAL/PLATELET
BASOS PCT: 0.5 % (ref 0.0–3.0)
Basophils Absolute: 0 10*3/uL (ref 0.0–0.1)
EOS ABS: 0.5 10*3/uL (ref 0.0–0.7)
EOS PCT: 6.7 % — AB (ref 0.0–5.0)
HCT: 48.2 % (ref 39.0–52.0)
HEMOGLOBIN: 16.3 g/dL (ref 13.0–17.0)
LYMPHS ABS: 2 10*3/uL (ref 0.7–4.0)
Lymphocytes Relative: 26.7 % (ref 12.0–46.0)
MCHC: 33.8 g/dL (ref 30.0–36.0)
MCV: 82.8 fl (ref 78.0–100.0)
MONO ABS: 0.5 10*3/uL (ref 0.1–1.0)
Monocytes Relative: 7.1 % (ref 3.0–12.0)
NEUTROS PCT: 59 % (ref 43.0–77.0)
Neutro Abs: 4.5 10*3/uL (ref 1.4–7.7)
Platelets: 270 10*3/uL (ref 150.0–400.0)
RBC: 5.82 Mil/uL — ABNORMAL HIGH (ref 4.22–5.81)
RDW: 13.4 % (ref 11.5–15.5)
WBC: 7.6 10*3/uL (ref 4.0–10.5)

## 2016-04-10 LAB — URINALYSIS, ROUTINE W REFLEX MICROSCOPIC
Bilirubin Urine: NEGATIVE
Hgb urine dipstick: NEGATIVE
KETONES UR: NEGATIVE
Leukocytes, UA: NEGATIVE
Nitrite: NEGATIVE
PH: 6.5 (ref 5.0–8.0)
RBC / HPF: NONE SEEN (ref 0–?)
TOTAL PROTEIN, URINE-UPE24: NEGATIVE
UROBILINOGEN UA: 0.2 (ref 0.0–1.0)
WBC UA: NONE SEEN (ref 0–?)

## 2016-04-10 NOTE — Patient Instructions (Signed)
GO TO THE LAB : Get the blood work     GO TO THE FRONT DESK Schedule your next appointment for a  Yearly check in 5-6 months

## 2016-04-10 NOTE — Progress Notes (Signed)
Subjective:    Patient ID: Danny [name], male    DOB: [birth year], 55 y.o.   MRN: [id number]  DOS:  04/10/2016 Type of visit - description : rov Interval history: Good med compliance, labs reviewed, due for a CBC Note from cards reviewed Pt c/o lower abd pain on-off x a while, worse x the last few weeks, episodes are colicky, lower mid-abdomen, not necessarily related to food intake or BMs.  Sx last 5 min  Wt Readings from Last 3 Encounters:  04/10/16 216 lb 4 oz (98.1 kg)  01/21/16 214 lb (97.1 kg)  01/21/16 214 lb 3.2 oz (97.2 kg)     Review of Systems  No fever chills No nausea, vomiting, diarrhea or blood in the stools. No flank pain No dysuria, gross hematuria difficulty urinating  Past Medical History:  Diagnosis Date  . Allergic rhinitis   . Benign prostatic hypertrophy    s/p TUNA procedure aprx 2006,  . Carpal tunnel syndrome 06/17/2009  . Diabetes mellitus    Type II dx 1999  . 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 ED (erectile dysfunction)    After TUNA  . Elevated LFTs 2010   (-) chronic hep  panel  . History of cardiovascular stress test 8/11   Negative  . Hyperlipidemia   . HYPERSOMNIA UNSPECIFIED 12/11/2007  . Hypertension   . INSOMNIA-SLEEP DISORDER-UNSPEC 04/12/2010  . Obesity   . PAF (paroxysmal atrial fibrillation) (HCC) 12/21/2009   a. Failed Multaq. b. Prev on flecainide. c. s/p afib ablation 2015.    Past Surgical History:  Procedure Laterality Date  . ABLATION  03-24-14   PVI by Dr Johney Frame  . ATRIAL FIBRILLATION ABLATION N/A 03/24/2014   Procedure: ATRIAL FIBRILLATION ABLATION;  Surgeon: Gardiner Rhyme, MD;  Location: [facility] CATH LAB;  Service: Cardiovascular;  Laterality: N/A;  . CYSTOSCOPY  11-09   neg  @ urology, was rx Flomax  . PROSTATE SURGERY     TUNA  . TEE WITHOUT CARDIOVERSION N/A 03/23/2014   Procedure: TRANSESOPHAGEAL ECHOCARDIOGRAM (TEE);  Surgeon: Pricilla Riffle, MD;  Location: [facility] ENDOSCOPY;   Service: Cardiovascular;  Laterality: N/A;  . VASECTOMY     several years ago    Social History   Social History  . Marital status: Married    Spouse name: N/A  . Number of children: 2  . Years of education: N/A   Occupational History  . Works for [employer] (state).  Drives a Scientist, product/process development [employer]    works for [employer] (state)   Social History Main Topics  . Smoking status: Former Smoker    Types: Cigarettes    Quit date: 06/26/1985  . Smokeless tobacco: Never Used  . Alcohol use No     Comment:    . Drug use: No  . Sexual activity: Not on file   Other Topics Concern  . Not on file   Social History Narrative   has a girlfriend, lives in [place]                 Medication List       Accurate as of 04/10/16  5:38 PM. Always use your most recent med list.          azelastine 0.1 % nasal spray Commonly known as:  ASTELIN PLACE 2 SPRAYS INTO BOTH NOSTRILS EVERY NIGHT AT BEDTIME AS NEEDED FOR RHINITIS   ELIQUIS 5 MG Tabs tablet Generic drug:  apixaban TAKE 1 TABLET(5  MG) BY MOUTH TWICE DAILY   fish oil-omega-3 fatty acids 1000 MG capsule Take 2 g by mouth 2 (two) times daily.   fluticasone 50 MCG/ACT nasal spray Commonly known as:  FLONASE Place 2 sprays into both nostrils daily.   gabapentin 600 MG tablet Commonly known as:  NEURONTIN Take 1 tablet (600 mg total) by mouth 3 (three) times daily.   glucagon 1 MG injection Commonly known as:  GLUCAGON EMERGENCY Inject 1 mg into the vein once as needed.   Insulin Pen Needle 31G X 8 MM Misc Commonly known as:  B-D ULTRAFINE III SHORT PEN Use one per day   INSULIN SYRINGE .5CC/31GX5/16" 31G X 5/16" 0.5 ML Misc USE ONE SYRINGE AS DIRECTED THREE TO FOUR TIMES DAILY   ketoconazole 2 % cream Commonly known as:  NIZORAL Apply 1 application topically daily.   loratadine 10 MG tablet Commonly known as:  CLARITIN Take 10 mg by mouth daily.   multivitamin tablet Take 1 tablet by mouth daily.   NOVOLOG FLEXPEN  100 UNIT/ML FlexPen Generic drug:  insulin aspart Inject 5-10 Units into the skin 3 (three) times daily with meals.   ONETOUCH VERIO test strip Generic drug:  glucose blood TEST BLOOD SUGAR FOUR TIMES DAILY   simvastatin 20 MG tablet Commonly known as:  ZOCOR Take 0.5 tablets (10 mg total) by mouth at bedtime.   tamsulosin 0.4 MG Caps capsule Commonly known as:  FLOMAX Take 1 capsule (0.4 mg total) by mouth daily.   TRESIBA FLEXTOUCH 100 UNIT/ML Sopn FlexTouch Pen Generic drug:  insulin degludec INJECT 60  UNITS SUBCUTANEOUSLY ONCE DAILY   XIGDUO XR 10-998 MG Tb24 Generic drug:  Dapagliflozin-Metformin HCl ER TAKE ONE TABLET BY MOUTH TWICE DAILY -REPLACES  INVOKAMET  XR.          Objective:   Physical Exam BP 128/72 (BP Location: Left Arm, Patient Position: Sitting, Cuff Size: Normal)   Pulse 63   Temp 97.7 F (36.5 C) (Oral)   Resp 14   Ht 5\' 11"  (1.803 m)   Wt 216 lb 4 oz (98.1 kg)   SpO2 98%   BMI 30.16 kg/m  General:   Well developed, well nourished . NAD.  HEENT:  Normocephalic . Face symmetric, atraumatic Lungs:  CTA B Normal respiratory effort, no intercostal retractions, no accessory muscle use. Heart: RRR,  no murmur.  no pretibial edema bilaterally  Abdomen:  Not distended, soft, non-tender. No rebound or rigidity.  Skin: Not pale. Not jaundice Neurologic:  alert & oriented X3.  Speech normal, gait appropriate for age and unassisted Psych--  Cognition and judgment appear intact.  Cooperative with normal attention span and concentration.  Behavior appropriate. No anxious or depressed appearing.    Assessment & Plan:   Assessment DM, w/ neuropathy, Dr Lucianne MussKumar HTN Hyperlipidemia Atrial fibrillation, 2011, s/p ablation, now sinus rhythm, anticoagulated BPH s/p TUNA ED S/P shingles, postherpetic neuralgia R foot  PLAN P-A fib--saw cardiology 01/21/2016, felt to be stable. Is anticoagulated, seems in sinus rhythm. Check a CBC DM: Sees endo.  Under excellent control, occasionally CBGs in the 75 range, he recognizes low sugars and treat them with candy. Abdominal pain: As described above, unclear etiology, abdominal exam benign. We are checking a CBC, will also check a UA and urine culture although GU ROS is  Negative.  RTC 5 months, CPX

## 2016-04-10 NOTE — Assessment & Plan Note (Signed)
P-A fib--saw cardiology 01/21/2016, felt to be stable. Is anticoagulated, seems in sinus rhythm. Check a CBC DM: Sees endo. Under excellent control, occasionally CBGs in the 75 range, he recognizes low sugars and treat them with candy. Abdominal pain: As described above, unclear etiology, abdominal exam benign. We are checking a CBC, will also check a UA and urine culture although GU ROS is  Negative.  RTC 5 months, CPX

## 2016-04-10 NOTE — Progress Notes (Signed)
Pre visit review using our clinic review tool, if applicable. No additional management support is needed unless otherwise documented below in the visit note. 

## 2016-04-11 LAB — URINE CULTURE: ORGANISM ID, BACTERIA: NO GROWTH

## 2016-04-13 ENCOUNTER — Other Ambulatory Visit: Payer: Self-pay | Admitting: Internal Medicine

## 2016-04-22 ENCOUNTER — Other Ambulatory Visit: Payer: Self-pay | Admitting: Internal Medicine

## 2016-04-26 ENCOUNTER — Other Ambulatory Visit: Payer: Self-pay | Admitting: Endocrinology

## 2016-04-26 MED ORDER — GLUCOSE BLOOD VI STRP: ORAL_STRIP | 1 refills | Status: DC

## 2016-04-26 NOTE — Telephone Encounter (Signed)
Refill sent.

## 2016-05-16 ENCOUNTER — Other Ambulatory Visit (INDEPENDENT_AMBULATORY_CARE_PROVIDER_SITE_OTHER): Payer: BC Managed Care – PPO

## 2016-05-16 DIAGNOSIS — E119 Type 2 diabetes mellitus without complications: Secondary | ICD-10-CM | POA: Diagnosis not present

## 2016-05-16 LAB — COMPREHENSIVE METABOLIC PANEL
ALBUMIN: 4.1 g/dL (ref 3.5–5.2)
ALK PHOS: 67 U/L (ref 39–117)
ALT: 40 U/L (ref 0–53)
AST: 23 U/L (ref 0–37)
BILIRUBIN TOTAL: 0.3 mg/dL (ref 0.2–1.2)
BUN: 14 mg/dL (ref 6–23)
CALCIUM: 9.4 mg/dL (ref 8.4–10.5)
CO2: 30 meq/L (ref 19–32)
Chloride: 105 mEq/L (ref 96–112)
Creatinine, Ser: 0.93 mg/dL (ref 0.40–1.50)
GFR: 88.9 mL/min (ref >60.00)
Glucose, Bld: 69 mg/dL — ABNORMAL LOW (ref 70–99)
Potassium: 4.2 mEq/L (ref 3.5–5.1)
Sodium: 141 mEq/L (ref 135–145)
Total Protein: 6.9 g/dL (ref 6.0–8.3)

## 2016-05-16 LAB — LIPID PANEL
CHOLESTEROL: 94 mg/dL (ref 0–200)
HDL: 33.1 mg/dL — AB (ref >39.00)
LDL Cholesterol: 27 mg/dL (ref 0–99)
NonHDL: 60.59
TRIGLYCERIDES: 168 mg/dL — AB (ref 0.0–149.0)
Total CHOL/HDL Ratio: 3
VLDL: 33.6 mg/dL (ref 0.0–40.0)

## 2016-05-16 LAB — HEMOGLOBIN A1C: Hgb A1c MFr Bld: 5.9 % (ref 4.6–6.5)

## 2016-05-21 ENCOUNTER — Other Ambulatory Visit: Payer: Self-pay | Admitting: Internal Medicine

## 2016-05-22 NOTE — Telephone Encounter (Signed)
Pt is requesting refill on Simvastatin 20mg  tabs: takes 1/2 tab at bedtime daily. Labs completed on 05/16/2016.

## 2016-05-22 NOTE — Telephone Encounter (Signed)
Rx sent 

## 2016-05-22 NOTE — Telephone Encounter (Signed)
Okay refill for 6 months 

## 2016-05-23 ENCOUNTER — Ambulatory Visit (INDEPENDENT_AMBULATORY_CARE_PROVIDER_SITE_OTHER): Payer: BC Managed Care – PPO | Admitting: Endocrinology

## 2016-05-23 ENCOUNTER — Encounter: Payer: Self-pay | Admitting: Endocrinology

## 2016-05-23 VITALS — BP 128/78 | HR 86 | Ht 71.0 in | Wt 219.0 lb

## 2016-05-23 DIAGNOSIS — E1142 Type 2 diabetes mellitus with diabetic polyneuropathy: Secondary | ICD-10-CM | POA: Diagnosis not present

## 2016-05-23 DIAGNOSIS — E782 Mixed hyperlipidemia: Secondary | ICD-10-CM | POA: Diagnosis not present

## 2016-05-23 DIAGNOSIS — E119 Type 2 diabetes mellitus without complications: Secondary | ICD-10-CM | POA: Diagnosis not present

## 2016-05-23 NOTE — Progress Notes (Signed)
Patient ID: Danny [name], male   DOB: 02/23/1961, 55 y.o.   MRN: [id number]     Reason for Appointment: Followup for Type 2 Diabetes  Referring physician: Drue Novel  History of Present Illness:          Diagnosis: Type 2 diabetes mellitus, date of diagnosis: 1992       Past history:  He has had long-standing diabetes and previously was treated with metformin, Amaryl, Januvia and Actos He was taken off metformin because of fear of liver problems.  May have been taken taken off Actos because of swelling of his legs but was on 45 mg at that time Over the last 2 years at least his A1c has been consistently over 8% He was started on insulin in 2011 probably when his A1c was over 9% Initially was given Lantus and Humalog and subsequently switched to 70/30 twice a day for unknown reasons Has not taken Byetta or Victoza in the past He had been on 70/30 insulin initially for a few years Previously A1c was 8% or more consistently before starting Invokamet. He was switched from Lantus to [place] in 1/17  Recent history:   INSULIN regimen is described as:  Tresiba 56 units at 9 pm Novolog 5-7 ac bid-tid Oral hypoglycemic drugs the patient is taking are: Xigduo, 10/998, 2 tablets daily  His blood sugars are continuing to be well controlled with A1c usually below 6%, now 5.9    Current management, blood sugar patterns and problems identified:  His blood sugars appear to be still excellent   However he is checking blood sugars mostly right before meals  FASTING blood sugars are relatively higher than the rest of the day with some variability  He has lunch about 5-6 hours after his breakfast and at times they feel hypoglycemic with glucose readings as low as 60, does not have a midmorning snack  He is generally more active in the morning hours done in the afternoon and lowest blood sugars are before lunch overall  He will not take insulin to cover his lunch if he is planning to be  active  Usually blood sugars are fairly good at suppertime with only a few readings above 160  Only rare blood sugars are being checked later at night but these are not unusually high  He has gained a little weight but he thinks this is from heavy clothes today  He does take as much as 8 units of insulin for eating a biscuit in the morning sometimes but otherwise 6-7 units           Side effects from medications have been: None  Compliance with the medical regimen: Good  Glucose monitoring:  done 3-4 times a day         Glucometer: One Touch Verio.      Blood Glucose readings    Mean values apply above for all meters except median for One Touch  PRE-MEAL Fasting Lunch Dinner Bedtime Overall  Glucose range: 75-151  60-113 79-168  150-159    Mean/median: 115   100   101     Glycemic control:   Lab Results  Component Value Date   HGBA1C 5.9 05/16/2016   HGBA1C 5.7 01/18/2016   HGBA1C 5.8 09/16/2015   Lab Results  Component Value Date   MICROALBUR <0.7 09/16/2015   LDLCALC 27 05/16/2016   CREATININE 0.93 05/16/2016    Retinal exam: annual    Self-care:  Meals: 3 meals per  day. Breakfast is eggs or cereal usually,  controlling portions      Dietician visit: Most recent: years ago.    Exercise:  he is active with moving equipment at work and some walking, does drive a truck                      Weight history:  Wt Readings from Last 3 Encounters:  05/23/16 219 lb (99.3 kg)  04/10/16 216 lb 4 oz (98.1 kg)  01/21/16 214 lb (97.1 kg)    No visits with results within 1 Week(s) from this visit.  Latest known visit with results is:  Lab on 05/16/2016  Component Date Value Ref Range Status  . Hgb A1c MFr Bld 05/16/2016 5.9  4.6 - 6.5 % Final  . Sodium 05/16/2016 141  135 - 145 mEq/L Final  . Potassium 05/16/2016 4.2  3.5 - 5.1 mEq/L Final  . Chloride 05/16/2016 105  96 - 112 mEq/L Final  . CO2 05/16/2016 30  19 - 32 mEq/L Final  . Glucose, Bld 05/16/2016 69* 70 -  99 mg/dL Final  . BUN 15/17/6160 14  6 - 23 mg/dL Final  . Creatinine, Ser 05/16/2016 0.93  0.40 - 1.50 mg/dL Final  . Total Bilirubin 05/16/2016 0.3  0.2 - 1.2 mg/dL Final  . Alkaline Phosphatase 05/16/2016 67  39 - 117 U/L Final  . AST 05/16/2016 23  0 - 37 U/L Final  . ALT 05/16/2016 40  0 - 53 U/L Final  . Total Protein 05/16/2016 6.9  6.0 - 8.3 g/dL Final  . Albumin 73/71/0626 4.1  3.5 - 5.2 g/dL Final  . Calcium 94/85/4627 9.4  8.4 - 10.5 mg/dL Final  . GFR 03/50/0938 88.90  >60.00 mL/min Final  . Cholesterol 05/16/2016 94  0 - 200 mg/dL Final  . Triglycerides 05/16/2016 168.0* 0.0 - 149.0 mg/dL Final  . HDL 18/29/9371 33.10* >39.00 mg/dL Final  . VLDL 69/67/8938 33.6  0.0 - 40.0 mg/dL Final  . LDL Cholesterol 05/16/2016 27  0 - 99 mg/dL Final  . Total CHOL/HDL Ratio 05/16/2016 3   Final  . NonHDL 05/16/2016 60.59   Final      Medication List       Accurate as of 05/23/16  8:48 PM. Always use your most recent med list.          azelastine 0.1 % nasal spray Commonly known as:  ASTELIN PLACE 2 SPRAYS INTO BOTH NOSTRILS EVERY NIGHT AT BEDTIME AS NEEDED FOR RHINITIS   ELIQUIS 5 MG Tabs tablet Generic drug:  apixaban TAKE 1 TABLET(5 MG) BY MOUTH TWICE DAILY   fish oil-omega-3 fatty acids 1000 MG capsule Take 2 g by mouth 2 (two) times daily.   fluticasone 50 MCG/ACT nasal spray Commonly known as:  FLONASE Place 2 sprays into both nostrils daily.   gabapentin 600 MG tablet Commonly known as:  NEURONTIN Take 1 tablet (600 mg total) by mouth 3 (three) times daily.   glucagon 1 MG injection Commonly known as:  GLUCAGON EMERGENCY Inject 1 mg into the vein once as needed.   glucose blood test strip Commonly known as:  ONETOUCH VERIO TEST BLOOD SUGAR FOUR TIMES DAILY   ONETOUCH VERIO test strip Generic drug:  glucose blood TEST BLOOD SUGAR FOUR TIMES DAILY   Insulin Pen Needle 31G X 8 MM Misc Commonly known as:  B-D ULTRAFINE III SHORT PEN Use one per day     INSULIN SYRINGE .5CC/31GX5/16" 31G  X 5/16" 0.5 ML Misc USE ONE SYRINGE AS DIRECTED THREE TO FOUR TIMES DAILY   ketoconazole 2 % cream Commonly known as:  NIZORAL Apply 1 application topically daily.   loratadine 10 MG tablet Commonly known as:  CLARITIN Take 10 mg by mouth daily.   multivitamin tablet Take 1 tablet by mouth daily.   NOVOLOG FLEXPEN 100 UNIT/ML FlexPen Generic drug:  insulin aspart Inject 5-10 Units into the skin 3 (three) times daily with meals.   NOVOLOG FLEXPEN 100 UNIT/ML FlexPen Generic drug:  insulin aspart INJECT 15 UNITS SUBCUTANEOUSLY THREE TIMES DAILY   simvastatin 20 MG tablet Commonly known as:  ZOCOR Take 0.5 tablets (10 mg total) by mouth at bedtime.   tamsulosin 0.4 MG Caps capsule Commonly known as:  FLOMAX Take 1 capsule (0.4 mg total) by mouth daily.   TRESIBA FLEXTOUCH 100 UNIT/ML Sopn FlexTouch Pen Generic drug:  insulin degludec INJECT 60  UNITS SUBCUTANEOUSLY ONCE DAILY   XIGDUO XR 10-998 MG Tb24 Generic drug:  Dapagliflozin-Metformin HCl ER TAKE ONE TABLET BY MOUTH TWICE DAILY -REPLACES  INVOKAMET  XR.       Allergies:  No Known Allergies  Past Medical History:  Diagnosis Date  . Allergic rhinitis   . Benign prostatic hypertrophy    s/p TUNA procedure aprx 2006,  . Carpal tunnel syndrome 06/17/2009  . Diabetes mellitus    Type II dx 1999  . 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 ED (erectile dysfunction)    After TUNA  . Elevated LFTs 2010   (-) chronic hep  panel  . History of cardiovascular stress test 8/11   Negative  . Hyperlipidemia   . HYPERSOMNIA UNSPECIFIED 12/11/2007  . Hypertension   . INSOMNIA-SLEEP DISORDER-UNSPEC 04/12/2010  . Obesity   . PAF (paroxysmal atrial fibrillation) (HCC) 12/21/2009   a. Failed Multaq. b. Prev on flecainide. c. s/p afib ablation 2015.    Past Surgical History:  Procedure Laterality Date  . ABLATION  03-24-14   PVI by Dr Johney Frame  . ATRIAL  FIBRILLATION ABLATION N/A 03/24/2014   Procedure: ATRIAL FIBRILLATION ABLATION;  Surgeon: Gardiner Rhyme, MD;  Location: [facility] CATH LAB;  Service: Cardiovascular;  Laterality: N/A;  . CYSTOSCOPY  11-09   neg  @ urology, was rx Flomax  . PROSTATE SURGERY     TUNA  . TEE WITHOUT CARDIOVERSION N/A 03/23/2014   Procedure: TRANSESOPHAGEAL ECHOCARDIOGRAM (TEE);  Surgeon: Pricilla Riffle, MD;  Location[facility] ENDOSCOPY;  Service: Cardiovascular;  Laterality: N/A;  . VASECTOMY     several years ago    Family History  Problem Relation Age of Onset  . Heart disease Mother     M-- MI,CHF  . Diabetes Mother   . Hypertension Mother   . Hypertension Father   . Cancer Neg Hx     Negative FH of Colon or Prostate Cancer    Social History:  reports that he quit smoking about 30 years ago. His smoking use included Cigarettes. He has never used smokeless tobacco. He reports that he does not drink alcohol or use drugs.    Review of Systems       Lipids: He has been on low dose simvastatin for a few years, LDL Is relatively low along with HDL also He is asking about minimal increase in triglycerides       Lab Results  Component Value Date   CHOL 94 05/16/2016   HDL 33.10 (L) 05/16/2016   LDLCALC  27 05/16/2016   TRIG 168.0 (H) 05/16/2016   CHOLHDL 3 05/16/2016               No hypertension: Currently not on ACE inhibitor       For several years has a history of Numbness, tingling  in feet and lower legs especially the right side Relieved by gabapentin 2 times a day, previously taking higher doses.    LABS:  No visits with results within 1 Week(s) from this visit.  Latest known visit with results is:  Lab on 05/16/2016  Component Date Value Ref Range Status  . Hgb A1c MFr Bld 05/16/2016 5.9  4.6 - 6.5 % Final  . Sodium 05/16/2016 141  135 - 145 mEq/L Final  . Potassium 05/16/2016 4.2  3.5 - 5.1 mEq/L Final  . Chloride 05/16/2016 105  96 - 112 mEq/L Final  . CO2 05/16/2016 30  19 - 32 mEq/L  Final  . Glucose, Bld 05/16/2016 69* 70 - 99 mg/dL Final  . BUN 21/30/8657 14  6 - 23 mg/dL Final  . Creatinine, Ser 05/16/2016 0.93  0.40 - 1.50 mg/dL Final  . Total Bilirubin 05/16/2016 0.3  0.2 - 1.2 mg/dL Final  . Alkaline Phosphatase 05/16/2016 67  39 - 117 U/L Final  . AST 05/16/2016 23  0 - 37 U/L Final  . ALT 05/16/2016 40  0 - 53 U/L Final  . Total Protein 05/16/2016 6.9  6.0 - 8.3 g/dL Final  . Albumin 84/69/6295 4.1  3.5 - 5.2 g/dL Final  . Calcium 28/41/3244 9.4  8.4 - 10.5 mg/dL Final  . GFR 06/28/7251 88.90  >60.00 mL/min Final  . Cholesterol 05/16/2016 94  0 - 200 mg/dL Final  . Triglycerides 05/16/2016 168.0* 0.0 - 149.0 mg/dL Final  . HDL 66/44/0347 33.10* >39.00 mg/dL Final  . VLDL 42/59/5638 33.6  0.0 - 40.0 mg/dL Final  . LDL Cholesterol 05/16/2016 27  0 - 99 mg/dL Final  . Total CHOL/HDL Ratio 05/16/2016 3   Final  . NonHDL 05/16/2016 60.59   Final    Physical Examination:  BP 128/78   Pulse 86   Ht 5\' 11"  (1.803 m)   Wt 219 lb (99.3 kg)   SpO2 96%   BMI 30.54 kg/m   ASSESSMENT/PLAN:    Diabetes type 2  with BMI 29 on insulin See history of present illness for detailed discussion of current diabetes management, blood sugar patterns and problems identified  His A1c is quite stable under 6% And consistent  This is with  regimen of mostly basal insulin and small amounts of mealtime insulin at breakfast and supper Fasting blood sugars are mildly increased but somewhat variable and overall averaging less than 120  Lowest blood sugars are at lunchtime because of increased activity but he is also going several hours between breakfast and lunch Does have mild tendency to hypoglycemia before lunch Does not check readings after supper usually and not clear if his insulin doses at suppertime is adequate  No side effects from [place] and renal function/potassium are stable  Recommendations today:  He can probably reduce his morning mealtime coverage by 1-2  units  Start having a midmorning snack at 10-10:30 AM to prevent hypoglycemia, discussed that his a.m. mealtime insulin is not causing low sugars at noon.  Snacks should be mostly carbohydrate but also some protein such as peanut butter crackers  No change in basal insulin  He needs to try check sugars after supper more often  than before eating, discussed blood sugar targets at various times  May reduce checking blood sugars in the morning  Follow-up in 4 months again  LIPIDS: Well controlled except for low HDL, likely does not have significant Hyperlipidemia as baseline and is being treated empirically by his PCP.  Minimal increase in triglycerides is not significant  Patient Instructions  Reduce am doisae to 5  Mid am snack when active  Less testing before and more after supper   Counseling time on subjects discussed above is over 50% of today's 25 minute visit  Danny [name] 05/23/2016, 8:48 PM   Note: This office note was prepared with Dragon voice recognition system technology. Any transcriptional errors that result from this process are unintentional. .

## 2016-05-23 NOTE — Patient Instructions (Addendum)
Reduce am dosage to 5  Mid am snack when active  Less testing before and more after supper

## 2016-06-02 ENCOUNTER — Encounter: Payer: Self-pay | Admitting: Internal Medicine

## 2016-06-08 ENCOUNTER — Encounter: Payer: Self-pay | Admitting: Internal Medicine

## 2016-06-08 ENCOUNTER — Ambulatory Visit (INDEPENDENT_AMBULATORY_CARE_PROVIDER_SITE_OTHER): Payer: BC Managed Care – PPO | Admitting: Internal Medicine

## 2016-06-08 ENCOUNTER — Encounter: Payer: Self-pay | Admitting: *Deleted

## 2016-06-08 VITALS — BP 130/72 | HR 64 | Ht 71.0 in | Wt 217.6 lb

## 2016-06-08 DIAGNOSIS — I480 Paroxysmal atrial fibrillation: Secondary | ICD-10-CM | POA: Diagnosis not present

## 2016-06-08 NOTE — Progress Notes (Signed)
PCP: Willow OraJose Paz, MD Primary Cardiologist:  Dr Neldon Newportrenshaw  Danny [name] is a 55 y.o. male who presents today for routine electrophysiology followup.  Since his last visit, the patient reports doing very well. He has had no AF.  He is pleased with his current health state.  Today, he denies symptoms of palpitations, chest pain, shortness of breath,  lower extremity edema, dizziness, presyncope, or syncope.  The patient is otherwise without complaint today.   Past Medical History:  Diagnosis Date  . Allergic rhinitis   . Benign prostatic hypertrophy    s/p TUNA procedure aprx 2006,  . Carpal tunnel syndrome 06/17/2009  . Diabetes mellitus    Type II dx 1999  . 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 ED (erectile dysfunction)    After TUNA  . Elevated LFTs 2010   (-) chronic hep  panel  . History of cardiovascular stress test 8/11   Negative  . Hyperlipidemia   . HYPERSOMNIA UNSPECIFIED 12/11/2007  . Hypertension   . INSOMNIA-SLEEP DISORDER-UNSPEC 04/12/2010  . Obesity   . PAF (paroxysmal atrial fibrillation) (HCC) 12/21/2009   a. Failed Multaq. b. Prev on flecainide. c. s/p afib ablation 2015.   Past Surgical History:  Procedure Laterality Date  . ABLATION  03-24-14   PVI by Dr Johney FrameAllred  . ATRIAL FIBRILLATION ABLATION N/A 03/24/2014   Procedure: ATRIAL FIBRILLATION ABLATION;  Surgeon: Gardiner RhymeJames D Floetta Brickey, MD;  Location: [facility] CATH LAB;  Service: Cardiovascular;  Laterality: N/A;  . CYSTOSCOPY  11-09   neg  @ urology, was rx Flomax  . PROSTATE SURGERY     TUNA  . TEE WITHOUT CARDIOVERSION N/A 03/23/2014   Procedure: TRANSESOPHAGEAL ECHOCARDIOGRAM (TEE);  Surgeon: Pricilla RifflePaula Ross V, MD;  [facility];  Service: Cardiovascular;  Laterality: N/A;  . VASECTOMY     several years ago    ROS- all systems are reviewed and negatives except as per HPI above  Current Outpatient Prescriptions  Medication Sig Dispense Refill  . azelastine (ASTELIN) 0.1 % nasal spray  PLACE 2 SPRAYS INTO BOTH NOSTRILS EVERY NIGHT AT BEDTIME AS NEEDED FOR RHINITIS 30 mL 0  . ELIQUIS 5 MG TABS tablet TAKE 1 TABLET(5 MG) BY MOUTH TWICE DAILY 60 tablet 5  . fish oil-omega-3 fatty acids 1000 MG capsule Take 2 g by mouth 2 (two) times daily.      . fluticasone (FLONASE) 50 MCG/ACT nasal spray Place 2 sprays into both nostrils daily as needed for allergies or rhinitis.    Marland Kitchen. gabapentin (NEURONTIN) 600 MG tablet Take 1 tablet (600 mg total) by mouth 3 (three) times daily. 90 tablet 6  . glucagon (GLUCAGON EMERGENCY) 1 MG injection Inject 1 mg into the vein once as needed. 1 each 5  . glucose blood (ONETOUCH VERIO) test strip TEST BLOOD SUGAR FOUR TIMES DAILY 125 each 1  . insulin aspart (NOVOLOG FLEXPEN) 100 UNIT/ML FlexPen Inject 5-10 Units into the skin 3 (three) times daily with meals.     . insulin degludec (TRESIBA FLEXTOUCH) 100 UNIT/ML SOPN FlexTouch Pen Inject 56 Units into the skin daily.    . Insulin Pen Needle (B-D ULTRAFINE III SHORT PEN) 31G X 8 MM MISC Use one per day 30 each 3  . Insulin Syringe-Needle U-100 (INSULIN SYRINGE .5CC/31GX5/16") 31G X 5/16" 0.5 ML MISC USE ONE SYRINGE AS DIRECTED THREE TO FOUR TIMES DAILY 100 each 3  . ketoconazole (NIZORAL) 2 % cream Apply 1 application topically daily. 30  g 0  . loratadine (CLARITIN) 10 MG tablet Take 10 mg by mouth daily.     . Multiple Vitamin (MULTIVITAMIN) tablet Take 1 tablet by mouth daily.    Letta Pate. ONETOUCH VERIO test strip TEST BLOOD SUGAR FOUR TIMES DAILY 125 each 1  . simvastatin (ZOCOR) 20 MG tablet Take 0.5 tablets (10 mg total) by mouth at bedtime. 15 tablet 6  . tamsulosin (FLOMAX) 0.4 MG CAPS capsule Take 1 capsule (0.4 mg total) by mouth daily. 30 capsule 1  . XIGDUO XR 10-998 MG TB24 TAKE ONE TABLET BY MOUTH TWICE DAILY -REPLACES  INVOKAMET  XR. 60 tablet 3   No current facility-administered medications for this visit.     Physical Exam: Vitals:   06/08/16 1205  BP: 130/72  Pulse: 64  Weight: 217 lb 9.6  oz (98.7 kg)  Height: 5\' 11"  (1.803 m)    GEN- The patient is well appearing, alert and oriented x 3 today.   Head- normocephalic, atraumatic Eyes-  Sclera clear, conjunctiva pink Ears- hearing intact Oropharynx- clear Lungs- Clear to ausculation bilaterally, normal work of breathing Heart- Regular rate and rhythm, no murmurs, rubs or gallops, PMI not laterally displaced GI- soft, NT, ND, + BS Extremities- no clubbing, cyanosis, or edema  ekg today reveals sinus rhythm   Assessment and Plan:  1. afib Doing well s/p ablation off AADs No recurrent afib post ablation Continue pradaxa for chads2vasc score of at least 2 (DM, HTN)  2.  htn Stable No change required today  Return in 12 months  Hillis RangeJames Lamont Tant MD, [facility] HouseFACC 06/08/2016 12:53 PM

## 2016-06-08 NOTE — Patient Instructions (Signed)

## 2016-06-20 ENCOUNTER — Other Ambulatory Visit: Payer: Self-pay | Admitting: Internal Medicine

## 2016-07-06 ENCOUNTER — Encounter: Payer: Self-pay | Admitting: Medical

## 2016-07-06 ENCOUNTER — Ambulatory Visit (INDEPENDENT_AMBULATORY_CARE_PROVIDER_SITE_OTHER): Payer: BC Managed Care – PPO | Admitting: Medical

## 2016-07-06 ENCOUNTER — Ambulatory Visit (HOSPITAL_BASED_OUTPATIENT_CLINIC_OR_DEPARTMENT_OTHER)
Admission: RE | Admit: 2016-07-06 | Discharge: 2016-07-06 | Disposition: A | Discharge status: Home / Self Care | Payer: BC Managed Care – PPO | Source: Ambulatory Visit | Attending: Medical | Admitting: Medical

## 2016-07-06 VITALS — BP 144/77 | HR 72 | Temp 98.0°F | Wt 216.4 lb

## 2016-07-06 DIAGNOSIS — R350 Frequency of micturition: Secondary | ICD-10-CM

## 2016-07-06 DIAGNOSIS — R319 Hematuria, unspecified: Secondary | ICD-10-CM | POA: Insufficient documentation

## 2016-07-06 DIAGNOSIS — N50812 Left testicular pain: Secondary | ICD-10-CM

## 2016-07-06 DIAGNOSIS — N433 Hydrocele, unspecified: Secondary | ICD-10-CM | POA: Diagnosis not present

## 2016-07-06 LAB — POC URINALSYSI DIPSTICK (AUTOMATED)
BILIRUBIN UA: NEGATIVE
KETONES UA: NEGATIVE
Leukocytes, UA: NEGATIVE
Nitrite, UA: NEGATIVE
PH UA: 6
Protein, UA: NEGATIVE
RBC UA: NEGATIVE
SPEC GRAV UA: 1.025
Urobilinogen, UA: 4

## 2016-07-06 MED ORDER — TRAMADOL HCL 50 MG PO TABS: 50.0000 mg | ORAL_TABLET | Freq: Four times a day (QID) | ORAL | 0 refills | Status: DC | PRN

## 2016-07-06 MED ORDER — CIPROFLOXACIN HCL 500 MG PO TABS: 500.0000 mg | ORAL_TABLET | Freq: Two times a day (BID) | ORAL | 0 refills | Status: DC

## 2016-07-06 NOTE — Progress Notes (Signed)
Subjective:    Patient ID: Danny [name], male    DOB: [birth year], 56 y.o.   MRN: [id number]  HPI  Pt in states just recently 2 days ago. He states moderate scattered blood in urine. Looked like some dc as well that looked mucous like that was floating in toilet water. Since 2 days ago he has not seen any gross blood in his urine. Pt is former smoker. Stopped 36 years ago. Pt states recent mild left testicle pain.But then states for years some on and off pain in left testicle.(in past associated with riding horses.)      Review of Systems  Constitutional: Negative for chills, fatigue and fever.  Respiratory: Negative for cough, shortness of breath and wheezing.   Cardiovascular: Negative for chest pain and palpitations.  Gastrointestinal: Negative for abdominal pain.  Genitourinary: Positive for discharge, frequency and testicular pain. Negative for decreased urine volume, dysuria, flank pain, penile pain, penile swelling, scrotal swelling and urgency.       More dc described as clear mucous in toilet water.  Musculoskeletal: Negative for back pain.  Neurological: Negative for dizziness and headaches.  Hematological: Negative for adenopathy. Does not bruise/bleed easily.    Past Medical History:  Diagnosis Date  . Allergic rhinitis   . Benign prostatic hypertrophy    s/p TUNA procedure aprx 2006,  . Carpal tunnel syndrome 06/17/2009  . Diabetes mellitus    Type II dx 1999  . 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 ED (erectile dysfunction)    After TUNA  . Elevated LFTs 2010   (-) chronic hep  panel  . History of cardiovascular stress test 8/11   Negative  . Hyperlipidemia   . HYPERSOMNIA UNSPECIFIED 12/11/2007  . Hypertension   . INSOMNIA-SLEEP DISORDER-UNSPEC 04/12/2010  . Obesity   . PAF (paroxysmal atrial fibrillation) (HCC) 12/21/2009   a. Failed Multaq. b. Prev on flecainide. c. s/p afib ablation 2015.     Social History   Social  History  . Marital status: Married    Spouse name: N/A  . Number of children: 2  . Years of education: N/A   Occupational History  . Works for [employer] (state[employer]  Drives a Scientist, product/process development [employer]    works for [employer] (state)   Social History Main Topics  . Smoking status: Former Smoker    Types: Cigarettes    Quit date: 06/26/1985  . Smokeless tobacco: Never Used  . Alcohol use No     Comment:    . Drug use: No  . Sexual activity: Not on file   Other Topics Concern  . Not on file   Social History Narrative   has a girlfriend, lives in [place]             Past Surgical History:  Procedure Laterality Date  . ABLATION  03-24-14   PVI by Dr Johney Frame  . ATRIAL FIBRILLATION ABLATION N/A 03/24/2014   Procedure: ATRIAL FIBRILLATION ABLATION;  Surgeon: Gardiner Rhyme, MD;  Location: [facility] CATH [facility];  Service: Cardiovascular;  Laterality: N/A;  . CYSTOSCOPY  11-09   neg  @ urology, was rx Flomax  . PROSTATE SURGERY     TUNA  . TEE WITHOUT CARDIOVERSION N/A 03/23/2014   Procedure: TRANSESOPHAGEAL ECHOCARDIOGRAM (TEE);  Surgeon: Pricilla Riffle, MD;  Location: [facility] ENDOSCOPY;  Service: Cardiovascular;  Laterality: N/A;  . VASECTOMY     several years ago    Family History  Problem  Relation Age of Onset  . Heart disease Mother     M-- MI,CHF  . Diabetes Mother   . Hypertension Mother   . Hypertension Father   . Cancer Neg Hx     Negative FH of Colon or Prostate Cancer    No Known Allergies  Current Outpatient Prescriptions on File Prior to Visit  Medication Sig Dispense Refill  . azelastine (ASTELIN) 0.1 % nasal spray PLACE 2 SPRAYS INTO BOTH NOSTRILS EVERY NIGHT AT BEDTIME AS NEEDED FOR RHINITIS 30 mL 0  . ELIQUIS 5 MG TABS tablet TAKE 1 TABLET(5 MG) BY MOUTH TWICE DAILY 60 tablet 5  . fish oil-omega-3 fatty acids 1000 MG capsule Take 2 g by mouth 2 (two) times daily.      . fluticasone (FLONASE) 50 MCG/ACT nasal spray Place 2 sprays into both nostrils daily as needed for allergies or  rhinitis.    Marland Kitchen gabapentin (NEURONTIN) 600 MG tablet Take 1 tablet (600 mg total) by mouth 3 (three) times daily. 90 tablet 6  . glucagon (GLUCAGON EMERGENCY) 1 MG injection Inject 1 mg into the vein once as needed. 1 each 5  . glucose blood (ONETOUCH VERIO) test strip TEST BLOOD SUGAR FOUR TIMES DAILY 125 each 1  . insulin aspart (NOVOLOG FLEXPEN) 100 UNIT/ML FlexPen Inject 5-10 Units into the skin 3 (three) times daily with meals.     . insulin degludec (TRESIBA FLEXTOUCH) 100 UNIT/ML SOPN FlexTouch Pen Inject 56 Units into the skin daily.    . Insulin Pen Needle (B-D ULTRAFINE III SHORT PEN) 31G X 8 MM MISC Use one per day 30 each 3  . Insulin Syringe-Needle U-100 (INSULIN SYRINGE .5CC/31GX5/16") 31G X 5/16" 0.5 ML MISC USE ONE SYRINGE AS DIRECTED THREE TO FOUR TIMES DAILY 100 each 3  . ketoconazole (NIZORAL) 2 % cream Apply 1 application topically daily. 30 g 0  . loratadine (CLARITIN) 10 MG tablet Take 10 mg by mouth daily.     . Multiple Vitamin (MULTIVITAMIN) tablet Take 1 tablet by mouth daily.    Letta Pate VERIO test strip TEST BLOOD SUGAR FOUR TIMES DAILY 125 each 1  . simvastatin (ZOCOR) 20 MG tablet Take 0.5 tablets (10 mg total) by mouth at bedtime. 15 tablet 6  . tamsulosin (FLOMAX) 0.4 MG CAPS capsule Take 1 capsule (0.4 mg total) by mouth daily. 30 capsule 5  . XIGDUO XR 10-998 MG TB24 TAKE ONE TABLET BY MOUTH TWICE DAILY -REPLACES  INVOKAMET  XR. 60 tablet 3   No current facility-administered medications on file prior to visit.     BP (!) 144/77 (BP Location: Right Arm, Patient Position: Sitting, Cuff Size: Normal)   Pulse 72   Temp 98 F (36.7 C) (Oral)   Wt 216 lb 6.4 oz (98.2 kg)   SpO2 97%   BMI 30.18 kg/m       Objective:   Physical Exam   General Appearance- Not in acute distress.  HEENT Eyes- Scleraeral/Conjuntiva-bilat- Not Yellow. Mouth & Throat- Normal.  Chest and Lung Exam Auscultation: Breath sounds:-Normal. Adventitious sounds:- No Adventitious  sounds.  Cardiovascular Auscultation:Rythm - Regular. Heart Sounds -Normal heart sounds.  Abdomen Inspection:-Inspection Normal.  Palpation/Perucssion: Palpation and Percussion of the abdomen reveal- faint llq paint tender initially(then repapated and not tender , No Rebound tenderness, No rigidity(Guarding) and No Palpable abdominal masses.  Liver:-Normal.  Spleen:- Normal.   Back- no cva tenderness.  Genital- no dc from penis, left testicle epididymus tender and feel mid nflammed. No hernia  felt.           Assessment & Plan:  For your testicle pain and frequent urination will get urine culture, psa and stat scrotal US.  Rx cipro antibiotic.  Can use tylenol for pain and tramadol for severe pain. Don't use any nsaids.  We need to repeat urine in 2 weeks and make sure no blood present. If any blood in on repeat ua or any gross blood again will refer to urologist.  If any severe testicle pain or lower abd/groin area pain despite the above then ED evaluation.  Follow up in 10-14 days or as needed  Kesley Gaffey, Ramon Dredge, [employer]

## 2016-07-06 NOTE — Patient Instructions (Addendum)
For your testicle pain and frequent urination will get urine culture, psa and stat scrotal US.  Rx cipro antibiotic.  Can use tylenol for pain and tramadol for severe pain. Don't use any nsaids.  We need to repeat urine in 2 weeks and make sure no blood present. If any blood in on repeat ua or any gross blood again will refer to urologist.  If any severe testicle pain or lower abd/groin area pain despite the above then ED evaluation.  Follow up in 10-14 days or as needed

## 2016-07-06 NOTE — Progress Notes (Signed)
hhPre visit review using our clinic review tool, if applicable. No additional management support is needed unless otherwise documented below in the visit note.

## 2016-07-07 LAB — PSA: PSA: 0.43 ng/mL (ref 0.10–4.00)

## 2016-07-08 ENCOUNTER — Telehealth: Payer: Self-pay | Admitting: Medical

## 2016-07-08 DIAGNOSIS — R310 Gross hematuria: Secondary | ICD-10-CM

## 2016-07-08 DIAGNOSIS — N50812 Left testicular pain: Secondary | ICD-10-CM

## 2016-07-08 LAB — URINE CULTURE: Organism ID, Bacteria: NO GROWTH

## 2016-07-08 NOTE — Telephone Encounter (Signed)
Referral to urologist placed. 

## 2016-07-10 ENCOUNTER — Other Ambulatory Visit: Payer: Self-pay | Admitting: Endocrinology

## 2016-07-15 ENCOUNTER — Other Ambulatory Visit: Payer: Self-pay | Admitting: Internal Medicine

## 2016-07-18 ENCOUNTER — Other Ambulatory Visit: Payer: Self-pay | Admitting: Cardiology

## 2016-07-20 ENCOUNTER — Ambulatory Visit: Payer: BC Managed Care – PPO | Admitting: Internal Medicine

## 2016-07-26 ENCOUNTER — Ambulatory Visit (INDEPENDENT_AMBULATORY_CARE_PROVIDER_SITE_OTHER): Payer: BC Managed Care – PPO | Admitting: Internal Medicine

## 2016-07-26 ENCOUNTER — Other Ambulatory Visit: Payer: Self-pay | Admitting: Endocrinology

## 2016-07-26 ENCOUNTER — Encounter: Payer: Self-pay | Admitting: Internal Medicine

## 2016-07-26 VITALS — BP 138/72 | HR 66 | Temp 98.0°F | Resp 14 | Ht 71.0 in | Wt 214.5 lb

## 2016-07-26 DIAGNOSIS — R109 Unspecified abdominal pain: Secondary | ICD-10-CM

## 2016-07-26 DIAGNOSIS — R310 Gross hematuria: Secondary | ICD-10-CM | POA: Insufficient documentation

## 2016-07-26 DIAGNOSIS — R319 Hematuria, unspecified: Secondary | ICD-10-CM

## 2016-07-26 NOTE — Progress Notes (Signed)
Pre visit review using our clinic review tool, if applicable. No additional management support is needed unless otherwise documented below in the visit note. 

## 2016-07-26 NOTE — Assessment & Plan Note (Signed)
Hematuria: Single episode of hematuria (mixed with some discharge?) Associated with left flank and LLQ pain  He has a history of BPH and s/p TUNA, he is a former smoker, UA, urine culture and PSA were normal. He received ciprofloxacin. No further episodes have, has a residual left lower quadrant discomfort.  Plan: Get a renal ultrasound due to residual discomfort  (stone?) And refer to urology. RTC April as planned

## 2016-07-26 NOTE — Patient Instructions (Signed)
Will schedule a ultrasound  Call if the pain is severe  We are referring you to urology  See you in April

## 2016-07-26 NOTE — Progress Notes (Signed)
Subjective:    Patient ID: Danny [name], male    DOB: 05/16/1961, 56 y.o.   MRN: [id number]  DOS:  07/26/2016 Type of visit - description : f/u Interval history: Had episodes of gross hematuria 07-04-2016: Fresh blood, in small pieces, single episode, no recurrence.  He had associated pain at the left flank- LLQ, on and off, pain at times intense, now essentially gone except for mild soreness there. He also reports he saw some discharge mixed with the urine that day. Has mild testicular discomfort on and off, not a new issue.   Review of Systems Denies fever chills No nausea vomiting No rash No actual dysuria, he does have occasional difficulty in urinary frequency but symptoms are not consistent/persistent.   Past Medical History:  Diagnosis Date  . Allergic rhinitis   . Benign prostatic hypertrophy    s/p TUNA procedure aprx 2006,  . Carpal tunnel syndrome 06/17/2009  . Diabetes mellitus    Type II dx 1999  . 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 ED (erectile dysfunction)    After TUNA  . Elevated LFTs 2010   (-) chronic hep  panel  . History of cardiovascular stress test 8/11   Negative  . Hyperlipidemia   . HYPERSOMNIA UNSPECIFIED 12/11/2007  . Hypertension   . INSOMNIA-SLEEP DISORDER-UNSPEC 04/12/2010  . Obesity   . PAF (paroxysmal atrial fibrillation) (HCC) 12/21/2009   a. Failed Multaq. b. Prev on flecainide. c. s/p afib ablation 2015.    Past Surgical History:  Procedure Laterality Date  . ABLATION  03-24-14   PVI by Dr Johney Frame  . ATRIAL FIBRILLATION ABLATION N/A 03/24/2014   Procedure: ATRIAL FIBRILLATION ABLATION;  Surgeon: Gardiner Rhyme, MD;  Location: [facility] CATH LAB;  Service: Cardiovascular;  Laterality: N/A;  . CYSTOSCOPY  11-09   neg  @ urology, was rx Flomax  . PROSTATE SURGERY     TUNA  . TEE WITHOUT CARDIOVERSION N/A 03/23/2014   Procedure: TRANSESOPHAGEAL ECHOCARDIOGRAM (TEE);  Surgeon: Pricilla Riffle, MD;  Location: [facility]  ENDOSCOPY;  Service: Cardiovascular;  Laterality: N/A;  . VASECTOMY     several years ago    Social History   Social History  . Marital status: Married    Spouse name: N/A  . Number of children: 2  . Years of education: N/A   Occupational History  . Works for [employer] (state).  Drives a Scientist, product/process development [employer]    works for [employer] (state)   Social History Main Topics  . Smoking status: Former Smoker    Types: Cigarettes    Quit date: 06/26/1985  . Smokeless tobacco: Never Used  . Alcohol use No     Comment:    . Drug use: No  . Sexual activity: Not on file   Other Topics Concern  . Not on file   Social History Narrative   has a girlfriend, lives in [place]               Allergies as of 07/26/2016   No Known Allergies     Medication List       Accurate as of 07/26/16  7:33 PM. Always use your most recent med list.          azelastine 0.1 % nasal spray Commonly known as:  ASTELIN PLACE 2 SPRAYS INTO BOTH NOSTRILS EVERY NIGHT AT BEDTIME AS NEEDED FOR RHINITIS   ELIQUIS 5 MG Tabs tablet Generic drug:  apixaban TAKE 1  TABLET(5 MG) BY MOUTH TWICE DAILY   fish oil-omega-3 fatty acids 1000 MG capsule Take 2 g by mouth 2 (two) times daily.   fluticasone 50 MCG/ACT nasal spray Commonly known as:  FLONASE Place 2 sprays into both nostrils daily as needed for allergies or rhinitis.   gabapentin 600 MG tablet Commonly known as:  NEURONTIN Take 1 tablet (600 mg total) by mouth 3 (three) times daily.   glucagon 1 MG injection Commonly known as:  GLUCAGON EMERGENCY Inject 1 mg into the vein once as needed.   Insulin Pen Needle 31G X 8 MM Misc Commonly known as:  B-D ULTRAFINE III SHORT PEN Use one per day   INSULIN SYRINGE .5CC/31GX5/16" 31G X 5/16" 0.5 ML Misc USE ONE SYRINGE AS DIRECTED THREE TO FOUR TIMES DAILY   ketoconazole 2 % cream Commonly known as:  NIZORAL Apply 1 application topically daily.   loratadine 10 MG tablet Commonly known as:   CLARITIN Take 10 mg by mouth daily.   multivitamin tablet Take 1 tablet by mouth daily.   NOVOLOG FLEXPEN 100 UNIT/ML FlexPen Generic drug:  insulin aspart Inject 5-10 Units into the skin 3 (three) times daily with meals.   ONETOUCH VERIO test strip Generic drug:  glucose blood TEST BLOOD SUGAR FOUR TIMES DAILY   ONETOUCH VERIO test strip Generic drug:  glucose blood USE ONE STRIP TO CHECK GLUCOSE 4 TIMES DAILY   simvastatin 20 MG tablet Commonly known as:  ZOCOR Take 0.5 tablets (10 mg total) by mouth at bedtime.   tamsulosin 0.4 MG Caps capsule Commonly known as:  FLOMAX Take 1 capsule (0.4 mg total) by mouth daily.   traMADol 50 MG tablet Commonly known as:  ULTRAM Take 1 tablet (50 mg total) by mouth every 6 (six) hours as needed for moderate pain.   TRESIBA FLEXTOUCH 100 UNIT/ML Sopn FlexTouch Pen Generic drug:  insulin degludec Inject 45 Units into the skin daily.   XIGDUO XR 10-998 MG Tb24 Generic drug:  Dapagliflozin-Metformin HCl ER TAKE ONE TABLET BY MOUTH TWICE DAILY -REPLACES  INVOKAMET  XR.          Objective:   Physical Exam BP 138/72 (BP Location: Right Arm, Patient Position: Sitting, Cuff Size: Normal)   Pulse 66   Temp 98 F (36.7 C) (Oral)   Resp 14   Ht 5\' 11"  (1.803 m)   Wt 214 lb 8 oz (97.3 kg)   SpO2 98%   BMI 29.92 kg/m  General:   Well developed, well nourished . NAD.  HEENT:  Normocephalic . Face symmetric, atraumatic Lungs:  CTA B Normal respiratory effort, no intercostal retractions, no accessory muscle use. Heart: RRR,  no murmur.  no pretibial edema bilaterally  Abdomen:  Not distended, soft, minimal discomfort at the left lower quadrant only with deep palpation, no mass, rebound. No CVA tenderness Skin: Not pale. Not jaundice Neurologic:  alert & oriented X3.  Speech normal, gait appropriate for age and unassisted Psych--  Cognition and judgment appear intact.  Cooperative with normal attention span and  concentration.  Behavior appropriate. No anxious or depressed appearing.    Assessment & Plan:   Assessment DM, w/ neuropathy, Dr Lucianne MussKumar HTN Hyperlipidemia Atrial fibrillation, 2011, s/p ablation, now sinus rhythm, anticoagulated BPH s/p TUNA ED S/P shingles, postherpetic neuralgia R foot  PLAN Hematuria: Single episode of hematuria (mixed with some discharge?) Associated with left flank and LLQ pain  He has a history of BPH and s/p TUNA, he is a  former smoker, UA, urine culture and PSA were normal. He received ciprofloxacin. No further episodes have, has a residual left lower quadrant discomfort.  Plan: Get a renal ultrasound due to residual discomfort  (stone?) And refer to urology. RTC April as planned

## 2016-07-31 ENCOUNTER — Ambulatory Visit (HOSPITAL_BASED_OUTPATIENT_CLINIC_OR_DEPARTMENT_OTHER): Admission: RE | Admit: 2016-07-31 | Payer: BC Managed Care – PPO | Source: Ambulatory Visit

## 2016-08-17 ENCOUNTER — Other Ambulatory Visit: Payer: Self-pay | Admitting: Cardiology

## 2016-08-18 ENCOUNTER — Encounter: Payer: Self-pay | Admitting: Endocrinology

## 2016-08-18 ENCOUNTER — Other Ambulatory Visit (INDEPENDENT_AMBULATORY_CARE_PROVIDER_SITE_OTHER): Payer: BC Managed Care – PPO

## 2016-08-18 DIAGNOSIS — E119 Type 2 diabetes mellitus without complications: Secondary | ICD-10-CM | POA: Diagnosis not present

## 2016-08-18 LAB — COMPREHENSIVE METABOLIC PANEL
ALT: 34 U/L (ref 0–53)
AST: 22 U/L (ref 0–37)
Albumin: 4.1 g/dL (ref 3.5–5.2)
Alkaline Phosphatase: 74 U/L (ref 39–117)
BUN: 13 mg/dL (ref 6–23)
CHLORIDE: 107 meq/L (ref 96–112)
CO2: 27 mEq/L (ref 19–32)
CREATININE: 0.9 mg/dL (ref 0.40–1.50)
Calcium: 9.2 mg/dL (ref 8.4–10.5)
GFR: 92.25 mL/min (ref >60.00)
GLUCOSE: 85 mg/dL (ref 70–99)
Potassium: 4.1 mEq/L (ref 3.5–5.1)
SODIUM: 140 meq/L (ref 135–145)
TOTAL PROTEIN: 6.8 g/dL (ref 6.0–8.3)
Total Bilirubin: 0.3 mg/dL (ref 0.2–1.2)

## 2016-08-18 LAB — MICROALBUMIN / CREATININE URINE RATIO
Creatinine,U: 38.1 mg/dL
Microalb Creat Ratio: 1.8 mg/g (ref 0.0–30.0)

## 2016-08-18 LAB — HEMOGLOBIN A1C: HEMOGLOBIN A1C: 6 % (ref 4.6–6.5)

## 2016-09-20 ENCOUNTER — Ambulatory Visit (INDEPENDENT_AMBULATORY_CARE_PROVIDER_SITE_OTHER): Payer: BC Managed Care – PPO | Admitting: Endocrinology

## 2016-09-20 ENCOUNTER — Encounter: Payer: Self-pay | Admitting: Endocrinology

## 2016-09-20 VITALS — BP 130/68 | HR 66 | Ht 71.0 in | Wt 214.0 lb

## 2016-09-20 DIAGNOSIS — E1142 Type 2 diabetes mellitus with diabetic polyneuropathy: Secondary | ICD-10-CM | POA: Diagnosis not present

## 2016-09-20 DIAGNOSIS — E119 Type 2 diabetes mellitus without complications: Secondary | ICD-10-CM | POA: Diagnosis not present

## 2016-09-20 MED ORDER — FREESTYLE LIBRE SENSOR SYSTEM MISC: 1.0000 | 3 refills | Status: DC

## 2016-09-20 NOTE — Progress Notes (Signed)
Patient ID: Danny [name], male   DOB: Jul 11, 1960, 56 y.o.   MRN: [id number]     Reason for Appointment: Followup for Type 2 Diabetes  Referring physician: Drue Novel  History of Present Illness:          Diagnosis: Type 2 diabetes mellitus, date of diagnosis: 1992       Past history:  He has had long-standing diabetes and previously was treated with metformin, Amaryl, Januvia and Actos He was taken off metformin because of fear of liver problems.  May have been taken taken off Actos because of swelling of his legs but was on 45 mg at that time Over the last 2 years at least his A1c has been consistently over 8% He was started on insulin in 2011 probably when his A1c was over 9% Initially was given Lantus and Humalog and subsequently switched to 70/30 twice a day for unknown reasons Has not taken Byetta or Victoza in the past He had been on 70/30 insulin initially for a few years Previously A1c was 8% or more consistently before starting Invokamet. He was switched from Lantus to [place] in 1/17  Recent history:   INSULIN regimen is described as:  Tresiba 45 units at 9 pm Novolog 5-8 ac bid-tid Oral hypoglycemic drugs the patient is taking are: Xigduo, 10/998, 2 tablets daily  His blood sugars are continuing to be well controlled with A1c usually below 6%, now 6    Current management, blood sugar patterns and problems identified:  His blood sugars appear to be still excellent  At home, checking blood sugars before meals onlyhowever  FASTING blood sugars are relatively higher than the rest of the day, averaging over 120 and previously about 115  He says he has been trying to cut back on his Tresiba insulin on his own, does not have any consistently high readings in the mornings  However has only rare blood sugars after supper  He thinks he is active with his work overall although not doing any formal exercise, his watch indicates that he may be walking up to 6 miles a  day  Still taking small doses of mealtime insulin and usually not taking any at lunchtime especially if he is very active  No hypoglycemia recently with the lowest blood sugar being 74  He does take as much as 8 units of insulin for eating a biscuit in the morning sometimes but otherwise 5-7 units, more if the sugar is higher in the morning           Side effects from medications have been: None  Compliance with the medical regimen: Good  Glucose monitoring:  done 3-4 times a day         Glucometer: One Touch Verio.      Blood Glucose readings  from download:  Mean values apply above for all meters except median for One Touch  PRE-MEAL Fasting Lunch Dinner Bedtime Overall  Glucose range: 91-156  74-135  77-174     Mean/median: 123 94 102  104    Glycemic control:   Lab Results  Component Value Date   HGBA1C 6.0 08/18/2016   HGBA1C 5.9 05/16/2016   HGBA1C 5.7 01/18/2016   Lab Results  Component Value Date   MICROALBUR <0.7 08/18/2016   LDLCALC 27 05/16/2016   CREATININE 0.90 08/18/2016    Retinal exam: annual    Self-care:  Meals: 3 meals per day. Breakfast is eggs or cereal usually,  controlling portions  Dietician visit: Most recent: years ago.    Exercise:  he is active with moving equipment at work and some walking, does drive a truck                      Weight history:  Wt Readings from Last 3 Encounters:  09/20/16 214 lb (97.1 kg)  07/26/16 214 lb 8 oz (97.3 kg)  07/06/16 216 lb 6.4 oz (98.2 kg)    No visits with results within 1 Week(s) from this visit.  Latest known visit with results is:  Lab on 08/18/2016  Component Date Value Ref Range Status  . Hgb A1c MFr Bld 08/18/2016 6.0  4.6 - 6.5 % Final  . Sodium 08/18/2016 140  135 - 145 mEq/L Final  . Potassium 08/18/2016 4.1  3.5 - 5.1 mEq/L Final  . Chloride 08/18/2016 107  96 - 112 mEq/L Final  . CO2 08/18/2016 27  19 - 32 mEq/L Final  . Glucose, Bld 08/18/2016 85  70 - 99 mg/dL Final  . BUN  44/06/270 13  6 - 23 mg/dL Final  . Creatinine, Ser 08/18/2016 0.90  0.40 - 1.50 mg/dL Final  . Total Bilirubin 08/18/2016 0.3  0.2 - 1.2 mg/dL Final  . Alkaline Phosphatase 08/18/2016 74  39 - 117 U/L Final  . AST 08/18/2016 22  0 - 37 U/L Final  . ALT 08/18/2016 34  0 - 53 U/L Final  . Total Protein 08/18/2016 6.8  6.0 - 8.3 g/dL Final  . Albumin 53/66/4403 4.1  3.5 - 5.2 g/dL Final  . Calcium 47/42/5956 9.2  8.4 - 10.5 mg/dL Final  . GFR 38/75/6433 92.25  >60.00 mL/min Final  . Microalb, Ur 08/18/2016 <0.7  0.0 - 1.9 mg/dL Final  . Creatinine,U 29/51/8841 38.1  mg/dL Final  . Microalb Creat Ratio 08/18/2016 1.8  0.0 - 30.0 mg/g Final    Allergies as of 09/20/2016   No Known Allergies     Medication List       Accurate as of 09/20/16  1:31 PM. Always use your most recent med list.          azelastine 0.1 % nasal spray Commonly known as:  ASTELIN PLACE 2 SPRAYS INTO BOTH NOSTRILS EVERY NIGHT AT BEDTIME AS NEEDED FOR RHINITIS   ELIQUIS 5 MG Tabs tablet Generic drug:  apixaban TAKE 1 TABLET(5 MG) BY MOUTH TWICE DAILY   fish oil-omega-3 fatty acids 1000 MG capsule Take 2 g by mouth 2 (two) times daily.   fluticasone 50 MCG/ACT nasal spray Commonly known as:  FLONASE Place 2 sprays into both nostrils daily as needed for allergies or rhinitis.   FREESTYLE LIBRE SENSOR SYSTEM Misc 1 strip by Does not apply route once a week. Apply to upper arm and change sensor every 10 days   gabapentin 600 MG tablet Commonly known as:  NEURONTIN Take 1 tablet (600 mg total) by mouth 3 (three) times daily.   glucagon 1 MG injection Commonly known as:  GLUCAGON EMERGENCY Inject 1 mg into the vein once as needed.   Insulin Pen Needle 31G X 8 MM Misc Commonly known as:  B-D ULTRAFINE III SHORT PEN Use one per day   INSULIN SYRINGE .5CC/31GX5/16" 31G X 5/16" 0.5 ML Misc USE ONE SYRINGE AS DIRECTED THREE TO FOUR TIMES DAILY   ketoconazole 2 % cream Commonly known as:  NIZORAL Apply  1 application topically daily.   loratadine 10 MG tablet Commonly known as:  CLARITIN Take 10 mg by mouth daily.   multivitamin tablet Take 1 tablet by mouth daily.   NOVOLOG FLEXPEN 100 UNIT/ML FlexPen Generic drug:  insulin aspart Inject 5-10 Units into the skin 3 (three) times daily with meals.   ONETOUCH VERIO test strip Generic drug:  glucose blood TEST BLOOD SUGAR FOUR TIMES DAILY   ONETOUCH VERIO test strip Generic drug:  glucose blood USE ONE STRIP TO CHECK GLUCOSE 4 TIMES DAILY   simvastatin 20 MG tablet Commonly known as:  ZOCOR Take 0.5 tablets (10 mg total) by mouth at bedtime.   tamsulosin 0.4 MG Caps capsule Commonly known as:  FLOMAX Take 1 capsule (0.4 mg total) by mouth daily.   traMADol 50 MG tablet Commonly known as:  ULTRAM Take 1 tablet (50 mg total) by mouth every 6 (six) hours as needed for moderate pain.   TRESIBA FLEXTOUCH 100 UNIT/ML Sopn FlexTouch Pen Generic drug:  insulin degludec Inject 45 Units into the skin daily.   XIGDUO XR 10-998 MG Tb24 Generic drug:  Dapagliflozin-Metformin HCl ER TAKE ONE TABLET BY MOUTH TWICE DAILY -REPLACES  INVOKAMET  XR.       Allergies:  No Known Allergies  Past Medical History:  Diagnosis Date  . Allergic rhinitis   . Benign prostatic hypertrophy    s/p TUNA procedure aprx 2006,  . Carpal tunnel syndrome 06/17/2009  . Diabetes mellitus    Type II dx 1999  . 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 ED (erectile dysfunction)    After TUNA  . Elevated LFTs 2010   (-) chronic hep  panel  . History of cardiovascular stress test 8/11   Negative  . Hyperlipidemia   . HYPERSOMNIA UNSPECIFIED 12/11/2007  . Hypertension   . INSOMNIA-SLEEP DISORDER-UNSPEC 04/12/2010  . Obesity   . PAF (paroxysmal atrial fibrillation) (HCC) 12/21/2009   a. Failed Multaq. b. Prev on flecainide. c. s/p afib ablation 2015.    Past Surgical History:  Procedure Laterality Date  . ABLATION   03-24-14   PVI by Dr Johney Frame  . ATRIAL FIBRILLATION ABLATION N/A 03/24/2014   Procedure: ATRIAL FIBRILLATION ABLATION;  Surgeon: Gardiner Rhyme, MD;  Location: [facility] CATH LAB;  Service: Cardiovascular;  Laterality: N/A;  . CYSTOSCOPY  11-09   neg  @ urology, was rx Flomax  . PROSTATE SURGERY     TUNA  . TEE WITHOUT CARDIOVERSION N/A 03/23/2014   Procedure: TRANSESOPHAGEAL ECHOCARDIOGRAM (TEE);  Surgeon: Pricilla Riffle, MD;  Location[facility] ENDOSCOPY;  Service: Cardiovascular;  Laterality: N/A;  . VASECTOMY     several years ago    Family History  Problem Relation Age of Onset  . Heart disease Mother     M-- MI,CHF  . Diabetes Mother   . Hypertension Mother   . Hypertension Father   . Cancer Neg Hx     Negative FH of Colon or Prostate Cancer    Social History:  reports that he quit smoking about 31 years ago. His smoking use included Cigarettes. He has never used smokeless tobacco. He reports that he does not drink alcohol or use drugs.    Review of Systems       Lipids: He has been on low dose simvastatin for a few years, LDL Is relatively low along with HDL also Also taking fish oil      Lab Results  Component Value Date   CHOL 94 05/16/2016   HDL 33.10 (L) 05/16/2016   LDLCALC  27 05/16/2016   TRIG 168.0 (H) 05/16/2016   CHOLHDL 3 05/16/2016               No hypertension: Currently not on ACE inhibitor       For several years has a history of Numbness, tingling  in feet and lower legs Relieved by gabapentin 2 times a day    LABS:  No visits with results within 1 Week(s) from this visit.  Latest known visit with results is:  Lab on 08/18/2016  Component Date Value Ref Range Status  . Hgb A1c MFr Bld 08/18/2016 6.0  4.6 - 6.5 % Final  . Sodium 08/18/2016 140  135 - 145 mEq/L Final  . Potassium 08/18/2016 4.1  3.5 - 5.1 mEq/L Final  . Chloride 08/18/2016 107  96 - 112 mEq/L Final  . CO2 08/18/2016 27  19 - 32 mEq/L Final  . Glucose, Bld 08/18/2016 85  70 - 99 mg/dL Final   . BUN 40/98/1191 13  6 - 23 mg/dL Final  . Creatinine, Ser 08/18/2016 0.90  0.40 - 1.50 mg/dL Final  . Total Bilirubin 08/18/2016 0.3  0.2 - 1.2 mg/dL Final  . Alkaline Phosphatase 08/18/2016 74  39 - 117 U/L Final  . AST 08/18/2016 22  0 - 37 U/L Final  . ALT 08/18/2016 34  0 - 53 U/L Final  . Total Protein 08/18/2016 6.8  6.0 - 8.3 g/dL Final  . Albumin 47/82/9562 4.1  3.5 - 5.2 g/dL Final  . Calcium 13/01/6577 9.2  8.4 - 10.5 mg/dL Final  . GFR 46/96/2952 92.25  >60.00 mL/min Final  . Microalb, Ur 08/18/2016 <0.7  0.0 - 1.9 mg/dL Final  . Creatinine,U 84/13/2440 38.1  mg/dL Final  . Microalb Creat Ratio 08/18/2016 1.8  0.0 - 30.0 mg/g Final    Physical Examination:  BP 130/68   Pulse 66   Ht 5\' 11"  (1.803 m)   Wt 214 lb (97.1 kg)   SpO2 97%   BMI 29.85 kg/m   ASSESSMENT/PLAN:    Diabetes type 2  with BMI 29 on insulin See history of present illness for detailed discussion of current diabetes management, blood sugar patterns and problems identified  His A1c is quite stable And now 6%, minimally higher than before  This is with  regimen of Tresiba as basal insulin and small amounts of mealtime insulin at breakfast and supper He is taking somewhat less insulin with his Evaristo Bury compared to his last visit, fasting readings tend to be the highest of the day although he does not check blood sugars after supper He is trying to be generally active with his work but he thinks he may be more active in summer at home also  No side effects from [place] and renal function/potassium are stable  Negative urine microalbumin recently  Recommendations today: He will continue the same dose of Tresiba unless blood sugars are low normal in the morning Discussed fasting blood sugar targets and also blood sugar targets after meals He does need to check some readings after his evening meal which he is not doing He does not have any documented hypoglycemia but may get low normal with more  activity during the day  Discussed possible use of the freestyle [place] system if it is affordable Showed him how the system would be used, information he can get from this and utility of the monitoring to help with preventing hypoglycemia and helping him adjust the mealtime coverage based on blood sugar  response to different meals Sample sensor and meter given   There are no Patient Instructions on file for this visit.  Counseling time on subjects discussed above is over 50% of today's 25 minute visit   Estus Krakowski 09/20/2016, 1:31 PM   Note: This office note was prepared with Insurance underwriter. Any transcriptional errors that result from this process are unintentional. .

## 2016-10-10 ENCOUNTER — Encounter: Payer: BC Managed Care – PPO | Admitting: Internal Medicine

## 2016-10-25 ENCOUNTER — Emergency Department (HOSPITAL_COMMUNITY): Payer: BC Managed Care – PPO

## 2016-10-25 ENCOUNTER — Telehealth: Payer: Self-pay | Admitting: *Deleted

## 2016-10-25 ENCOUNTER — Emergency Department (HOSPITAL_COMMUNITY)
Admission: EM | Admit: 2016-10-25 | Discharge: 2016-10-25 | Disposition: A | Discharge status: Home / Self Care | Payer: BC Managed Care – PPO | Attending: Emergency Medicine | Admitting: Emergency Medicine

## 2016-10-25 ENCOUNTER — Encounter (HOSPITAL_COMMUNITY): Payer: Self-pay | Admitting: *Deleted

## 2016-10-25 ENCOUNTER — Telehealth: Payer: Self-pay | Admitting: Internal Medicine

## 2016-10-25 DIAGNOSIS — R42 Dizziness and giddiness: Secondary | ICD-10-CM | POA: Diagnosis not present

## 2016-10-25 DIAGNOSIS — Z79899 Other long term (current) drug therapy: Secondary | ICD-10-CM | POA: Insufficient documentation

## 2016-10-25 DIAGNOSIS — I10 Essential (primary) hypertension: Secondary | ICD-10-CM | POA: Insufficient documentation

## 2016-10-25 DIAGNOSIS — Z87891 Personal history of nicotine dependence: Secondary | ICD-10-CM | POA: Diagnosis not present

## 2016-10-25 DIAGNOSIS — R200 Anesthesia of skin: Secondary | ICD-10-CM | POA: Diagnosis not present

## 2016-10-25 DIAGNOSIS — I679 Cerebrovascular disease, unspecified: Secondary | ICD-10-CM | POA: Diagnosis not present

## 2016-10-25 DIAGNOSIS — Z5181 Encounter for therapeutic drug level monitoring: Secondary | ICD-10-CM | POA: Insufficient documentation

## 2016-10-25 DIAGNOSIS — Z794 Long term (current) use of insulin: Secondary | ICD-10-CM | POA: Insufficient documentation

## 2016-10-25 DIAGNOSIS — E119 Type 2 diabetes mellitus without complications: Secondary | ICD-10-CM | POA: Insufficient documentation

## 2016-10-25 LAB — CBC
HCT: 48.5 % (ref 39.0–52.0)
Hemoglobin: 16.5 g/dL (ref 13.0–17.0)
MCH: 28 pg (ref 26.0–34.0)
MCHC: 34 g/dL (ref 30.0–36.0)
MCV: 82.3 fL (ref 78.0–100.0)
PLATELETS: 232 10*3/uL (ref 150–400)
RBC: 5.89 MIL/uL — ABNORMAL HIGH (ref 4.22–5.81)
RDW: 13.9 % (ref 11.5–15.5)
WBC: 7 10*3/uL (ref 4.0–10.5)

## 2016-10-25 LAB — I-STAT CHEM 8, ED
BUN: 17 mg/dL (ref 6–20)
CHLORIDE: 101 mmol/L (ref 101–111)
Calcium, Ion: 1.11 mmol/L — ABNORMAL LOW (ref 1.15–1.40)
Creatinine, Ser: 0.8 mg/dL (ref 0.61–1.24)
Glucose, Bld: 147 mg/dL — ABNORMAL HIGH (ref 65–99)
HCT: 49 % (ref 39.0–52.0)
Hemoglobin: 16.7 g/dL (ref 13.0–17.0)
POTASSIUM: 3.7 mmol/L (ref 3.5–5.1)
SODIUM: 140 mmol/L (ref 135–145)
TCO2: 24 mmol/L (ref 0–100)

## 2016-10-25 LAB — COMPREHENSIVE METABOLIC PANEL
ALBUMIN: 3.9 g/dL (ref 3.5–5.0)
ALT: 36 U/L (ref 17–63)
AST: 28 U/L (ref 15–41)
Alkaline Phosphatase: 76 U/L (ref 38–126)
Anion gap: 9 (ref 5–15)
BUN: 16 mg/dL (ref 6–20)
CHLORIDE: 104 mmol/L (ref 101–111)
CO2: 24 mmol/L (ref 22–32)
CREATININE: 0.91 mg/dL (ref 0.61–1.24)
Calcium: 8.7 mg/dL — ABNORMAL LOW (ref 8.9–10.3)
GFR calc non Af Amer: >60 mL/min (ref >60)
Glucose, Bld: 147 mg/dL — ABNORMAL HIGH (ref 65–99)
Potassium: 3.9 mmol/L (ref 3.5–5.1)
SODIUM: 137 mmol/L (ref 135–145)
Total Bilirubin: 0.5 mg/dL (ref 0.3–1.2)
Total Protein: 6.7 g/dL (ref 6.5–8.1)

## 2016-10-25 LAB — DIFFERENTIAL
BASOS ABS: 0 10*3/uL (ref 0.0–0.1)
BASOS PCT: 0 %
Eosinophils Absolute: 0.4 10*3/uL (ref 0.0–0.7)
Eosinophils Relative: 6 %
Lymphocytes Relative: 29 %
Lymphs Abs: 2 10*3/uL (ref 0.7–4.0)
MONO ABS: 0.4 10*3/uL (ref 0.1–1.0)
Monocytes Relative: 6 %
NEUTROS ABS: 4.1 10*3/uL (ref 1.7–7.7)
NEUTROS PCT: 59 %

## 2016-10-25 LAB — CBG MONITORING, ED: GLUCOSE-CAPILLARY: 152 mg/dL — AB (ref 65–99)

## 2016-10-25 LAB — RAPID URINE DRUG SCREEN, HOSP PERFORMED
AMPHETAMINES: NOT DETECTED
BENZODIAZEPINES: NOT DETECTED
Barbiturates: NOT DETECTED
Cocaine: NOT DETECTED
Opiates: NOT DETECTED
Tetrahydrocannabinol: NOT DETECTED

## 2016-10-25 LAB — I-STAT TROPONIN, ED
TROPONIN I, POC: 0 ng/mL (ref 0.00–0.08)
Troponin i, poc: 0 ng/mL (ref 0.00–0.08)

## 2016-10-25 LAB — PROTIME-INR
INR: 1.01
PROTHROMBIN TIME: 13.3 s (ref 11.4–15.2)

## 2016-10-25 LAB — APTT: APTT: 36 s (ref 24–36)

## 2016-10-25 LAB — ETHANOL

## 2016-10-25 NOTE — Telephone Encounter (Signed)
Wife said they are in the ER with numbness on face and neck and arm.  MRI ruled out stroke.   Told her that when she gets home, call the 800 number to replace the one day sensor, and she said they did that, and they are going to replace it, but it will not come for 5 days.  She was wondering if we had one he could wear until then.  I told her no.

## 2016-10-25 NOTE — Telephone Encounter (Signed)
FYI

## 2016-10-25 NOTE — Telephone Encounter (Signed)
Patient called stating he has the freestyle lebra in his arm and it cost 60$ for a set of 3 that last a month and he has the last one on and it came off. Please advise [phone]

## 2016-10-25 NOTE — Telephone Encounter (Signed)
Patient Name: Danny [name] DOB: [birth year] Initial Comment Caller states numbness in lower jaw and cheek, left arm pain. Nurse Assessment Nurse: Charna Elizabeth, RN, Cathy Date/Time (Eastern Time): 10/25/2016 11:38:23 AM Confirm and document reason for call. If symptomatic, describe symptoms. ---[name] states her developed left arm pain about a week ago and numbness on the left side of his face today. No chest pain. No severe breathing difficulty. Alert and responsive. Does the patient have any new or worsening symptoms? ---Yes Will a triage be completed? ---Yes Related visit to physician within the last 2 weeks? ---No Does the PT have any chronic conditions? (i.e. diabetes, asthma, etc.) ---Yes List chronic conditions. ---Diabetes, High Cholesterol Is this a behavioral health or substance abuse call? ---No Guidelines Guideline Title Affirmed Question Affirmed Notes Neurologic Deficit [1] Numbness (i.e., loss of sensation) of the face, arm / hand, or leg / foot on one side of the body AND [2] sudden onset AND [3] brief (now gone) Final Disposition User Go to ED Now (or PCP triage) Charna Elizabeth, RN, [name] will call EMS as needed. Referrals [facility] - ED Disagree/Comply: Comply

## 2016-10-25 NOTE — Consult Note (Signed)
Requesting Physician: Dr. Fayrene Fearing    Chief Complaint:  Left facial tingling and arm tinggling  History obtained from:  Patient     HPI:                                                                                                                                         Danny [name] is an 56 y.o. male presenting to the emergency department with left face, neck, arm decreased sensation. Patient states that this is been going on and off for approximately 1 week. At times it is just around his chin, other times it includes just his left face, and other times includes his face and his arm. There are times it is also associated with dizziness. Patient states denies any headache or visual problems that occur at the same time. Patient denies any weakness, dysarthria, or other neurological symptoms have come on with this. He has not seen any physician for this problem. The reason he was brought to the ED today as code as it was more prominent and lasted longer than it has before. Patient denies any head or neck trauma. He has had some pain in his left elbow to which she believes might be a bursitis.    Date last known well: Date: 10/25/2016 Time last known well: Time: 08:00 tPA Given: No: minimal symptoms  Modified Rankin: Rankin Score=0    Past Medical History:  Diagnosis Date  . Allergic rhinitis   . Benign prostatic hypertrophy    s/p TUNA procedure aprx 2006,  . Carpal tunnel syndrome 06/17/2009  . Diabetes mellitus    Type II dx 1999  . 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 ED (erectile dysfunction)    After TUNA  . Elevated LFTs 2010   (-) chronic hep  panel  . History of cardiovascular stress test 8/11   Negative  . Hyperlipidemia   . HYPERSOMNIA UNSPECIFIED 12/11/2007  . Hypertension   . INSOMNIA-SLEEP DISORDER-UNSPEC 04/12/2010  . Obesity   . PAF (paroxysmal atrial fibrillation) (HCC) 12/21/2009   a. Failed Multaq. b. Prev on flecainide. c. s/p  afib ablation 2015.    Past Surgical History:  Procedure Laterality Date  . ABLATION  03-24-14   PVI by Dr Johney Frame  . ATRIAL FIBRILLATION ABLATION N/A 03/24/2014   Procedure: ATRIAL FIBRILLATION ABLATION;  Surgeon: Gardiner Rhyme, MD;  Location: [facility] CATH LAB;  Service: Cardiovascular;  Laterality: N/A;  . CYSTOSCOPY  11-09   neg  @ urology, was rx Flomax  . PROSTATE SURGERY     TUNA  . TEE WITHOUT CARDIOVERSION N/A 03/23/2014   Procedure: TRANSESOPHAGEAL ECHOCARDIOGRAM (TEE);  Surgeon: Pricilla Riffle, MD;  Location: [facility] ENDOSCOPY;  Service: Cardiovascular;  Laterality: N/A;  . VASECTOMY     several years ago    Family History  Problem Relation Age of Onset  . Heart  disease Mother     M-- MI,CHF  . Diabetes Mother   . Hypertension Mother   . Hypertension Father   . Cancer Neg Hx     Negative FH of Colon or Prostate Cancer   Social History:  reports that he quit smoking about 31 years ago. His smoking use included Cigarettes. He has never used smokeless tobacco. He reports that he does not drink alcohol or use drugs.  Allergies: No Known Allergies  Medications:                                                                                                                          No current facility-administered medications for this encounter.    Current Outpatient Prescriptions  Medication Sig Dispense Refill  . azelastine (ASTELIN) 0.1 % nasal spray PLACE 2 SPRAYS INTO BOTH NOSTRILS EVERY NIGHT AT BEDTIME AS NEEDED FOR RHINITIS 30 mL 0  . Continuous Blood Gluc Sensor (FREESTYLE LIBRE SENSOR SYSTEM) MISC 1 strip by Does not apply route once a week. Apply to upper arm and change sensor every 10 days 3 each 3  . ELIQUIS 5 MG TABS tablet TAKE 1 TABLET(5 MG) BY MOUTH TWICE DAILY 60 tablet 5  . fish oil-omega-3 fatty acids 1000 MG capsule Take 2 g by mouth 2 (two) times daily.      . fluticasone (FLONASE) 50 MCG/ACT nasal spray Place 2 sprays into both nostrils daily as needed for  allergies or rhinitis.    Marland Kitchen gabapentin (NEURONTIN) 600 MG tablet Take 1 tablet (600 mg total) by mouth 3 (three) times daily. 90 tablet 5  . glucagon (GLUCAGON EMERGENCY) 1 MG injection Inject 1 mg into the vein once as needed. 1 each 5  . insulin aspart (NOVOLOG FLEXPEN) 100 UNIT/ML FlexPen Inject 5-10 Units into the skin 3 (three) times daily with meals.     . insulin degludec (TRESIBA FLEXTOUCH) 100 UNIT/ML SOPN FlexTouch Pen Inject 45 Units into the skin daily.     . Insulin Pen Needle (B-D ULTRAFINE III SHORT PEN) 31G X 8 MM MISC Use one per day 30 each 3  . Insulin Syringe-Needle U-100 (INSULIN SYRINGE .5CC/31GX5/16") 31G X 5/16" 0.5 ML MISC USE ONE SYRINGE AS DIRECTED THREE TO FOUR TIMES DAILY 100 each 3  . ketoconazole (NIZORAL) 2 % cream Apply 1 application topically daily. 30 g 0  . loratadine (CLARITIN) 10 MG tablet Take 10 mg by mouth daily.     . Multiple Vitamin (MULTIVITAMIN) tablet Take 1 tablet by mouth daily.    Letta Pate VERIO test strip TEST BLOOD SUGAR FOUR TIMES DAILY 125 each 1  . ONETOUCH VERIO test strip USE ONE STRIP TO CHECK GLUCOSE 4 TIMES DAILY 125 each 1  . simvastatin (ZOCOR) 20 MG tablet Take 0.5 tablets (10 mg total) by mouth at bedtime. 15 tablet 6  . tamsulosin (FLOMAX) 0.4 MG CAPS capsule Take 1 capsule (0.4 mg total) by mouth daily. 30 capsule 5  .  traMADol (ULTRAM) 50 MG tablet Take 1 tablet (50 mg total) by mouth every 6 (six) hours as needed for moderate pain. 12 tablet 0  . XIGDUO XR 10-998 MG TB24 TAKE ONE TABLET BY MOUTH TWICE DAILY -REPLACES  INVOKAMET  XR. 60 tablet 3     ROS:                                                                                                                                       History obtained from the patient  General ROS: negative for - chills, fatigue, fever, night sweats, weight gain or weight loss Psychological ROS: negative for - behavioral disorder, hallucinations, memory difficulties, mood swings or suicidal  ideation Ophthalmic ROS: negative for - blurry vision, double vision, eye pain or loss of vision ENT ROS: negative for - epistaxis, nasal discharge, oral lesions, sore throat, tinnitus or vertigo Allergy and Immunology ROS: negative for - hives or itchy/watery eyes Hematological and Lymphatic ROS: negative for - bleeding problems, bruising or swollen lymph nodes Endocrine ROS: negative for - galactorrhea, hair pattern changes, polydipsia/polyuria or temperature intolerance Respiratory ROS: negative for - cough, hemoptysis, shortness of breath or wheezing Cardiovascular ROS: negative for - chest pain, dyspnea on exertion, edema or irregular heartbeat Gastrointestinal ROS: negative for - abdominal pain, diarrhea, hematemesis, nausea/vomiting or stool incontinence Genito-Urinary ROS: negative for - dysuria, hematuria, incontinence or urinary frequency/urgency Musculoskeletal ROS: negative for - joint swelling or muscular weakness Neurological ROS: as noted in HPI Dermatological ROS: negative for rash and skin lesion changes  Neurologic Examination:                                                                                                      Blood pressure (!) 148/92, pulse 92, temperature 97.9 F (36.6 C), temperature source Oral, resp. rate 18, SpO2 97 %.  HEENT-  Normocephalic, no lesions, without obvious abnormality.  Normal external eye and conjunctiva.  Normal TM's bilaterally.  Normal auditory canals and external ears. Normal external nose, mucus membranes and septum.  Normal pharynx. Cardiovascular- S1, S2 normal, pulses palpable throughout   Lungs- chest clear, no wheezing, rales, normal symmetric air entry Abdomen- normal findings: bowel sounds normal Extremities- no edema Lymph-no adenopathy palpable Musculoskeletal-no joint tenderness, deformity or swelling Skin-warm and dry, no hyperpigmentation, vitiligo, or suspicious lesions  Neurological Examination Mental  Status: Alert, oriented, thought content appropriate.  Speech fluent without evidence of aphasia.  Able to follow 3 step commands without difficulty. Cranial  Nerves: II:  Visual fields grossly normal,  III,IV, VI: ptosis not present, extra-ocular motions intact bilaterally, pupils equal, round, reactive to light and accommodation V,VII: smile symmetric, facial light touch sensation decreased on the left V2 and 3 region VIII: hearing normal bilaterally IX,X: uvula rises symmetrically XI: bilateral shoulder shrug XII: midline tongue extension Motor: Right : Upper extremity   5/5    Left:     Upper extremity   5/5  Lower extremity   5/5     Lower extremity   5/5 Tone and bulk:normal tone throughout; no atrophy noted Sensory: Pinprick and light touch decreased on the left arm compared to the right arm. There is no dermatomal distribution twitches decreased. Deep Tendon Reflexes: 2+ and symmetric throughout Plantars: Right: downgoing   Left: downgoing Cerebellar: normal finger-to-nose,  and normal heel-to-shin test Gait: Not tested       Lab Results: Basic Metabolic Panel:  Recent Labs Lab 10/25/16 1329 10/25/16 1340  NA 137 140  K 3.9 3.7  CL 104 101  CO2 24  --   GLUCOSE 147* 147*  BUN 16 17  CREATININE 0.91 0.80  CALCIUM 8.7*  --     Liver Function Tests:  Recent Labs Lab 10/25/16 1329  AST 28  ALT 36  ALKPHOS 76  BILITOT 0.5  PROT 6.7  ALBUMIN 3.9   No results for input(s): LIPASE, AMYLASE in the last 168 hours. No results for input(s): AMMONIA in the last 168 hours.  CBC:  Recent Labs Lab 10/25/16 1329 10/25/16 1340  WBC 7.0  --   NEUTROABS 4.1  --   HGB 16.5 16.7  HCT 48.5 49.0  MCV 82.3  --   PLT 232  --     Cardiac Enzymes: No results for input(s): CKTOTAL, CKMB, CKMBINDEX, TROPONINI in the last 168 hours.  Lipid Panel: No results for input(s): CHOL, TRIG, HDL, CHOLHDL, VLDL, LDLCALC in the last 168 hours.  CBG:  Recent Labs Lab  10/25/16 1327  GLUCAP 152*    Microbiology: Results for orders placed or performed in visit on 07/06/16  Urine culture     Status: None   Collection Time: 07/06/16  5:54 PM  Result Value Ref Range Status   Organism ID, Bacteria NO GROWTH  Final    Coagulation Studies:  Recent Labs  10/25/16 1329  LABPROT 13.3  INR 1.01    Imaging: Ct Head Code Stroke W/o Cm  Result Date: 10/25/2016 CLINICAL DATA:  Code stroke.  Left facial numbness. EXAM: CT HEAD WITHOUT CONTRAST TECHNIQUE: Contiguous axial images were obtained from the base of the skull through the vertex without intravenous contrast. COMPARISON:  12/23/2014 FINDINGS: Brain: No evidence of acute infarction, hemorrhage, hydrocephalus, extra-axial collection or mass lesion/mass effect. Vascular: No hyperdense vessel or unexpected calcification. Skull: Normal. Negative for fracture or focal lesion. Sinuses/Orbits: Negative Other: These results were called by telephone at the time of interpretation on 10/25/2016 at 1:47 pm to Dr. Pricilla Loveless , who verbally acknowledged these results. ASPECTS [facility] Stroke Program Early CT Score) - Ganglionic level infarction (caudate, lentiform nuclei, internal capsule, insula, M1-M3 cortex): 7 - Supraganglionic infarction (M4-M6 cortex): 3 Total score (0-10 with 10 being normal): 10 IMPRESSION: No acute finding. ASPECTS is 10. Electronically Signed   By: Marnee Spring M.D.   On: 10/25/2016 13:48       Assessment and plan discussed with with attending physician and they are in agreement.    Felicie Morn PA-C [facility] [phone]  10/25/2016, 2:05 PM   Assessment: 56 y.o. male presenting to the ED with transient left facial, neck, arm decreased sensation that is intermittent however started at 8:00 again this morning and lasted for a longer period of time. He was not a TPA candidate secondary to a minimal NIH stroke scale. Patient's decreased sensation does not fit any dermatomal  distribution. At this time differential includes possible TIA however it would be odd to occur in the same distribution multiple times as it has. Other differentials include possible myelitis or cervical etiology.  It is been recommended to the ED to get a MRI of the brain and cervical spine without contrast.  Stroke Risk Factors - atrial fibrillation, diabetes mellitus, hyperlipidemia and hypertension

## 2016-10-25 NOTE — ED Provider Notes (Signed)
[facility] DEPT Provider Note   CSN: [id number] Arrival date & time: 10/25/16  1317   An emergency department physician performed an initial assessment on this suspected stroke patient at 1321.  History   Chief Complaint Chief Complaint  Patient presents with  . Code Stroke    HPI Danny [name] is a 56 y.o. male.  56 year old male with past medical history including hypertension, hyperlipidemia, type 2 diabetes mellitus who presents with facial numbness. The patient reports a one-week history of intermittent numbness on his chin. Today the area seemed to spread to his entire left face. He also had a 30 minute episode of left eye blurry vision today that occurred around lunchtime and then resolved. No visual changes currently. He felt off balance today as well. When he arrived to the ED, symptoms were concerning for possible stroke and a code stroke was called. He denies any headache, chest pain, shortness of breath, extremity numbness/weakness, speech problems, or confusion.   The history is provided by the patient.    Past Medical History:  Diagnosis Date  . Allergic rhinitis   . Benign prostatic hypertrophy    s/p TUNA procedure aprx 2006,  . Carpal tunnel syndrome 06/17/2009  . Diabetes mellitus    Type II dx 1999  . DM type 2 with diabetic peripheral neuropathy ([place]) 10/15/2014  . ED (erectile dysfunction)    After TUNA  . Elevated LFTs 2010   (-) chronic hep  panel  . History of cardiovascular stress test 8/11   Negative  . Hyperlipidemia   . HYPERSOMNIA UNSPECIFIED 12/11/2007  . Hypertension   . INSOMNIA-SLEEP DISORDER-UNSPEC 04/12/2010  . Obesity   . PAF (paroxysmal atrial fibrillation) ([place]) 12/21/2009   a. Failed Multaq. b. Prev on flecainide. c. s/p afib ablation 2015.    Patient Active Problem List   Diagnosis Date Noted  . Gross hematuria 07/26/2016  . Obesity 01/21/2016  . PCP NOTES >>>>>>>>>>>>>>>>>>>>>>> 08/12/2015  . DM type 2 with diabetic  peripheral neuropathy ([place]) 10/15/2014  . Bilateral foot pain 09/10/2014  . Skin infection 09/10/2014  . Shingles-- Post herpetic neuralgia R foot  08/06/2013  . Annual physical exam 01/13/2011  . Hyperlipidemia   . Hypertension   . PAF (paroxysmal atrial fibrillation) ([place]) 12/21/2009  . DYSLIPIDEMIA 12/14/2009  . CARPAL TUNNEL SYNDROME 06/17/2009  . DMII (diabetes mellitus, type 2) ([place]) 03/12/2007  . ALLERGIC RHINITIS 03/12/2007  . BENIGN PROSTATIC HYPERTROPHY 03/12/2007  . 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 ABLATION  03-24-14   PVI by Dr Rayann Heman  . ATRIAL FIBRILLATION ABLATION N/A 03/24/2014   Procedure: ATRIAL FIBRILLATION ABLATION;  Surgeon: Coralyn Mark, MD;  Location: [facility];  Service: Cardiovascular;  Laterality: N/A;  . CYSTOSCOPY  11-09   neg  @ urology, was rx Flomax  . PROSTATE SURGERY     TUNA  . TEE WITHOUT CARDIOVERSION N/A 03/23/2014   Procedure: TRANSESOPHAGEAL ECHOCARDIOGRAM (TEE);  Surgeon: Fay Records, MD;  Location: [facility];  Service: Cardiovascular;  Laterality: N/A;  . VASECTOMY     several years ago       Home Medications    Prior to Admission medications   Medication Sig Start Date End Date Taking? Authorizing Provider  acetaminophen (TYLENOL) 650 MG CR tablet Take 650 mg by mouth every 8 (eight) hours as needed for pain.   Yes Historical Provider, MD  azelastine (ASTELIN) 0.1 % nasal spray PLACE 2 SPRAYS  INTO BOTH NOSTRILS EVERY NIGHT AT BEDTIME AS NEEDED FOR RHINITIS 12/27/15  Yes Colon Branch, MD  Continuous Blood Gluc Sensor (FREESTYLE LIBRE SENSOR SYSTEM) MISC 1 strip by Does not apply route once a week. Apply to upper arm and change sensor every 10 days 09/20/16  Yes Elayne Snare, MD  ELIQUIS 5 MG TABS tablet TAKE 1 TABLET(5 MG) BY MOUTH TWICE DAILY Patient taking differently: Take 5 mg by mouth two times a day 08/17/16  Yes Thompson Grayer, MD  fish oil-omega-3 fatty acids 1000 MG  capsule Take 2 g by mouth 2 (two) times daily.     Yes Historical Provider, MD  fluticasone (FLONASE) 50 MCG/ACT nasal spray Place 2 sprays into both nostrils See admin instructions. IN THE MORNING AS NEEDED FOR RHINITIS   Yes Historical Provider, MD  gabapentin (NEURONTIN) 600 MG tablet Take 1 tablet (600 mg total) by mouth 3 (three) times daily. Patient taking differently: Take 600 mg by mouth 2 (two) times daily.  07/17/16  Yes Colon Branch, MD  glucagon (GLUCAGON EMERGENCY) 1 MG injection Inject 1 mg into the vein once as needed. Patient taking differently: Inject 1 mg into the muscle once as needed (diabetic emergency).  02/18/14  Yes Elayne Snare, MD  insulin aspart (NOVOLOG FLEXPEN) 100 UNIT/ML FlexPen Inject 5-10 Units into the skin 3 (three) times daily as needed for high blood sugar (IF BGL IS 100 OR GREATER).    Yes Historical Provider, MD  insulin degludec (TRESIBA FLEXTOUCH) 100 UNIT/ML SOPN FlexTouch Pen Inject 35 Units into the skin at bedtime.    Yes Historical Provider, MD  Insulin Pen Needle (B-D ULTRAFINE III SHORT PEN) 31G X 8 MM MISC Use one per day 02/08/15  Yes Elayne Snare, MD  Insulin Syringe-Needle U-100 (INSULIN SYRINGE .5CC/31GX5/16") 31G X 5/16" 0.5 ML MISC USE ONE SYRINGE AS DIRECTED THREE TO FOUR TIMES DAILY 05/04/14  Yes Elayne Snare, MD  loratadine (CLARITIN) 10 MG tablet Take 10 mg by mouth daily.    Yes Historical Provider, MD  Multiple Vitamin (MULTIVITAMIN) tablet Take 1 tablet by mouth daily.   Yes Historical Provider, MD  [facility] VERIO test strip TEST BLOOD SUGAR FOUR TIMES DAILY 04/26/16  Yes Elayne Snare, MD  ONETOUCH VERIO test strip USE ONE STRIP TO CHECK GLUCOSE 4 TIMES DAILY 07/10/16  Yes Elayne Snare, MD  Polyethyl Glyc-Propyl Glyc PF (SYSTANE ULTRA PF) 0.4-0.3 % SOLN Apply 2 drops to eye 3 (three) times daily as needed (for dry eyes).   Yes Historical Provider, MD  simvastatin (ZOCOR) 20 MG tablet Take 0.5 tablets (10 mg total) by mouth at bedtime. 05/22/16  Yes Colon Branch, MD  tamsulosin (FLOMAX) 0.4 MG CAPS capsule Take 1 capsule (0.4 mg total) by mouth daily. 06/20/16  Yes Colon Branch, MD  XIGDUO XR 10-998 MG TB24 TAKE ONE TABLET BY MOUTH TWICE DAILY -REPLACES  INVOKAMET  XR. 07/26/16  Yes Elayne Snare, MD  ketoconazole (NIZORAL) 2 % cream Apply 1 application topically daily. Patient not taking: Reported on 10/25/2016 08/12/15   Colon Branch, MD  traMADol (ULTRAM) 50 MG tablet Take 1 tablet (50 mg total) by mouth every 6 (six) hours as needed for moderate pain. Patient not taking: Reported on 10/25/2016 07/06/16   Mackie Pai, PA-C    Family History Family History  Problem Relation Age of Onset  . Heart disease Mother     M-- MI,CHF  . Diabetes Mother   . Hypertension Mother   .  Hypertension Father   . Cancer Neg Hx     Negative FH of Colon or Prostate Cancer    Social History Social History  Substance Use Topics  . Smoking status: Former Smoker    Types: Cigarettes    Quit date: 06/26/1985  . Smokeless tobacco: Never Used  . Alcohol use No     Comment:       Allergies   Patient has no known allergies.   Review of Systems Review of Systems All other systems reviewed and are negative except that which was mentioned in HPI  Physical Exam Updated Vital Signs BP 130/79   Pulse 60   Temp 97.9 F (36.6 C)   Resp 16   SpO2 98%   Physical Exam  Constitutional: He is oriented to person, place, and time. He appears well-developed and well-nourished. No distress.  Awake, alert  HENT:  Head: Normocephalic and atraumatic.  Eyes: Conjunctivae and EOM are normal. Pupils are equal, round, and reactive to light.  Neck: Neck supple.  Cardiovascular: Normal rate, regular rhythm and normal heart sounds.   No murmur heard. Pulmonary/Chest: Effort normal and breath sounds normal. No respiratory distress.  Abdominal: Soft. Bowel sounds are normal. He exhibits no distension. There is no tenderness.  Musculoskeletal: He exhibits no edema.    Neurological: He is alert and oriented to person, place, and time. He has normal reflexes. No cranial nerve deficit. He exhibits normal muscle tone.  Fluent speech, normal finger-to-nose testing, negative pronator drift, no clonus Mildly decreased sensation L face 5/5 strength and normal sensation x all 4 extremities  Skin: Skin is warm and dry.  Psychiatric: He has a normal mood and affect. Judgment and thought content normal.  Nursing note and vitals reviewed.    ED Treatments / Results  Labs (all labs ordered are listed, but only abnormal results are displayed) Labs Reviewed  CBC - Abnormal; Notable for the following:       Result Value   RBC 5.89 (*)    All other components within normal limits  COMPREHENSIVE METABOLIC PANEL - Abnormal; Notable for the following:    Glucose, Bld 147 (*)    Calcium 8.7 (*)    All other components within normal limits  CBG MONITORING, ED - Abnormal; Notable for the following:    Glucose-Capillary 152 (*)    All other components within normal limits  I-STAT CHEM 8, ED - Abnormal; Notable for the following:    Glucose, Bld 147 (*)    Calcium, Ion 1.11 (*)    All other components within normal limits  ETHANOL  PROTIME-INR  APTT  DIFFERENTIAL  RAPID URINE DRUG SCREEN, HOSP PERFORMED  URINALYSIS, ROUTINE W REFLEX MICROSCOPIC  I-STAT TROPOININ, ED  I-STAT TROPOININ, ED    EKG  EKG Interpretation  Date/Time:  Wednesday Oct 25 2016 13:23:31 EDT Ventricular Rate:  91 PR Interval:  136 QRS Duration: 88 QT Interval:  368 QTC Calculation: 452 R Axis:   61 Text Interpretation:  Normal sinus rhythm ST & T wave abnormality, consider inferior ischemia Abnormal ECG T wave inversions inferior leads new from previous Confirmed by Relda Agosto MD, Zainab Crumrine [phone]) on 10/25/2016 3:15:57 PM       Radiology [name] Contrast  Result Date: 10/25/2016 CLINICAL DATA:  56 year old male code stroke, left facial numbness. Dizziness. A fib. EXAM: MRI HEAD  WITHOUT CONTRAST TECHNIQUE: Multiplanar, multiecho pulse sequences of the brain and surrounding structures were obtained without intravenous contrast. COMPARISON:  Head CTs  without contrast 10/25/2016 and earlier. FINDINGS: Brain: No restricted diffusion to suggest acute infarction. No midline shift, mass effect, evidence of mass lesion, ventriculomegaly, extra-axial collection or acute intracranial hemorrhage. Cervicomedullary junction and pituitary are within normal limits. No cortical encephalomalacia or chronic cerebral blood products identified. Scattered small foci of T2 and FLAIR hyperintensity in the bilateral cerebral white matter, extent is mild for age. Deep gray matter nuclei, brainstem, and cerebellum appear normal. Vascular: Major intracranial vascular flow voids are preserved. Skull and upper cervical spine: Negative aside from mild degenerative ligamentous hypertrophy about the odontoid. Normal bone marrow signal. Sinuses/Orbits: Negative orbits soft tissues. Ethmoid sinus mucosal thickening and opacification again noted. Trace maxillary sinus mucosal thickening. Other Visualized paranasal sinuses and mastoids are stable and well pneumatized. Other: Visible internal auditory structures appear normal. Negative scalp soft tissues. IMPRESSION: 1.  No acute intracranial abnormality. 2. Mild for age nonspecific cerebral white matter signal changes. 3. Mild to moderate ethmoid sinus inflammation. Electronically Signed   By: Genevie Ann M.D.   On: 10/25/2016 15:26   Mr Cervical Spine Wo Contrast  Result Date: 10/25/2016 CLINICAL DATA:  Face and neck numbness.  Bilateral finger tingling. EXAM: MRI CERVICAL SPINE WITHOUT CONTRAST TECHNIQUE: Multiplanar, multisequence MR imaging of the cervical spine was performed. No intravenous contrast was administered. COMPARISON:  None. FINDINGS: Alignment: Physiologic. Vertebrae: No fracture, evidence of discitis, or bone lesion. Cord: Normal signal and morphology when  accounting for motion artifact. Posterior Fossa, vertebral arteries, paraspinal tissues: Negative. Disc levels: C2-3: Mild facet spurring on the right. Mild ligamentum flavum thickening. No impingement C3-4: Mild disc narrowing with posterior disc osteophyte complex partially effacing the ventral subarachnoid space. Uncovertebral spurring with estimated mild to moderate foraminal narrowing on the left. C4-5: Unremarkable. C5-6: Left paracentral disc protrusion contacting the ventral cord without deformity. Patent foramina C6-7: Possible tiny central disc protrusion without neural contact. C7-T1:Unremarkable. IMPRESSION: 1. No acute finding or evidence of myelopathy. 2. C5-6 left paracentral disc protrusion contacting the cord without compression. 3. C3-4 borderline spinal stenosis. Mild to moderate left foraminal stenosis. 4. Sensitivity is diminished by intermittent motion. Electronically Signed   By: Monte Fantasia M.D.   On: 10/25/2016 15:46   Ct Head Code Stroke W/o Cm  Result Date: 10/25/2016 CLINICAL DATA:  Code stroke.  Left facial numbness. EXAM: CT HEAD WITHOUT CONTRAST TECHNIQUE: Contiguous axial images were obtained from the base of the skull through the vertex without intravenous contrast. COMPARISON:  12/23/2014 FINDINGS: Brain: No evidence of acute infarction, hemorrhage, hydrocephalus, extra-axial collection or mass lesion/mass effect. Vascular: No hyperdense vessel or unexpected calcification. Skull: Normal. Negative for fracture or focal lesion. Sinuses/Orbits: Negative Other: These results were called by telephone at the time of interpretation on 10/25/2016 at 1:47 pm to Dr. Sherwood Gambler , who verbally acknowledged these results. ASPECTS [facility] Stroke Program Early CT Score) - Ganglionic level infarction (caudate, lentiform nuclei, internal capsule, insula, M1-M3 cortex): 7 - Supraganglionic infarction (M4-M6 cortex): 3 Total score (0-10 with 10 being normal): 10 IMPRESSION: No acute finding.  ASPECTS is 10. Electronically Signed   By: Monte Fantasia M.D.   On: 10/25/2016 13:48    Procedures Procedures (including critical care time)  Medications Ordered in ED Medications - No data to display   Initial Impression / Assessment and Plan / ED Course  I have reviewed the triage vital signs and the nursing notes.  Pertinent labs & imaging results that were available during my care of the patient were reviewed by me  and considered in my medical decision making (see chart for details).     PT w/ intermittent numbness on L face, worse today, as well as episode of L eye blurry vision and off-balance sensation. Code stroke called in triage and pt taken to [facility] where he was met by neurology, Dr. Shon Hale. Head CT negative acute. His neurologic exam was notable only for mildly decreased left face sensation but the remainder of his exam was normal. Labwork unremarkable. He has some nonspecific T-wave inversions in inferior leads but he denies any episodes of chest pain, shortness of breath, or nausea or vomiting today or anytime recently thus I do not feel his symptoms represent cardiac pathology. MRI head and C-spine ordered by neurology. Imaging negative for stroke or other acute pathology to explain the patient's symptoms. Patient remains comfortable and well-appearing on reexamination. Discussed again with Dr. Shon Hale who recommended no further neurologic workup and felt that the patient could follow up with PCP. The patient did complain of some left arm pain after MRI. He again denied any chest pain, shortness of breath, or other associated symptoms. Because of this, I obtain a second troponin which was also negative. The patient has an appointment with his PCP tomorrow. I have extensively reviewed return precautions including development of any new neurologic symptoms. Patient voiced understanding and was discharged in satisfactory condition.  Final Clinical Impressions(s) / ED Diagnoses    Final diagnoses:  Left facial numbness  Dizziness    New Prescriptions New Prescriptions   No medications on file     Sharlett Iles, MD 10/25/16 1810

## 2016-10-25 NOTE — ED Notes (Signed)
Patient transported to MRI 

## 2016-10-25 NOTE — ED Triage Notes (Signed)
PT is having numbness to left side of face and down jaw and reports bilateral fingers tingling.  No chest pain.  Pt states the numbness has been off and on for the last week and today it messed with his left eye with increasing blurriness.  Pt states just the numbness in jaw and cheek is still there.  Started between 8-9 this am. Pt states then pain to left ear and down neck, denies chest pain.  Pt has afib and takes Eliquis.  Pt reports some dizziness today. Pt states he felt drunk.  The blurriness has resolved.  Dr. Criss Alvine assessing patient now. Non headache.

## 2016-10-25 NOTE — Telephone Encounter (Signed)
Bonita Quin, Could you review and please advise the patient? Thanks!

## 2016-10-25 NOTE — Code Documentation (Signed)
55yo male arriving to [facility] via private vehicle at 1317.  Patient reporting intermittent left facial and neck numbness x 1 week.  Today symptoms began at 0800 and patient reported left eye blurred vision and dizziness.  Code stroke activated and patient to CT.  Stroke team to CT.  NIHSS 2, see documentation for details and code stroke times.  Patient reporting left face and arm decreased sensation on exam.  Of note, patient with h/o atrial fibrillation on Eliquis.  Patient is outside the window and contraindicated for tPA.  No acute stroke treatment at this time.  Code stroke canceled.  Bedside handoff with ED RN Steward Drone.

## 2016-10-26 ENCOUNTER — Encounter: Payer: Self-pay | Admitting: Internal Medicine

## 2016-10-26 ENCOUNTER — Ambulatory Visit (INDEPENDENT_AMBULATORY_CARE_PROVIDER_SITE_OTHER): Payer: BC Managed Care – PPO | Admitting: Internal Medicine

## 2016-10-26 ENCOUNTER — Encounter: Payer: Self-pay | Admitting: Neurology

## 2016-10-26 VITALS — BP 132/76 | HR 76 | Temp 98.1°F | Resp 14 | Ht 71.0 in | Wt 208.2 lb

## 2016-10-26 DIAGNOSIS — G459 Transient cerebral ischemic attack, unspecified: Secondary | ICD-10-CM | POA: Diagnosis not present

## 2016-10-26 DIAGNOSIS — E1142 Type 2 diabetes mellitus with diabetic polyneuropathy: Secondary | ICD-10-CM

## 2016-10-26 LAB — VITAMIN B12: VITAMIN B 12: 344 pg/mL (ref 211–911)

## 2016-10-26 LAB — FOLATE: FOLATE: 12.2 ng/mL (ref >5.9)

## 2016-10-26 NOTE — Progress Notes (Signed)
Subjective:    Patient ID: Danny [name], male    DOB: [birth year], 56 y.o.   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 He also felt numbness on the left side of the face and neck, left eye vision got blurred. There were no witnesses. He was able to "compose" and call his wife and subsequently the nurse, he does not recall being told that he was slurring his words. Confusion lasted about 45 minutes, facial numbness has progressively decreased and is minimal today. Denies chest pain, difficulty breathing, diplopia. No headaches. No facial rash. ER records: labs were essentially unremarkable , calcium slt low  CT head-  noacute Brain MRI-  No acute Cervical spine MRI-  No acute, see below  1. No acute finding or evidence of myelopathy. 2. C5-6 left paracentral disc protrusion contacting the cord without compression. 3. C3-4 borderline spinal stenosis. Mild to moderate left foraminal stenosis.   Review of Systems  This morning feels better. Last A1c was 6.0, he has experienced multiple times low CBGs in the ambulatory setting. Has gradually decreased his insulin. He has self decreased gabapentin twice a day several weeks ago  because lower extremity numbness improved   Past Medical History:  Diagnosis Date  . Allergic rhinitis   . Benign prostatic hypertrophy    s/p TUNA procedure aprx 2006,  . Carpal tunnel syndrome 06/17/2009  . Diabetes mellitus    Type II dx 1999  . 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 ED (erectile dysfunction)    After TUNA  . Elevated LFTs 2010   (-) chronic hep  panel  . History of cardiovascular stress test 8/11   Negative  . Hyperlipidemia   . HYPERSOMNIA UNSPECIFIED 12/11/2007  . Hypertension   . INSOMNIA-SLEEP DISORDER-UNSPEC 04/12/2010  . Obesity   . PAF (paroxysmal atrial  fibrillation) (HCC) 12/21/2009   a. Failed Multaq. b. Prev on flecainide. c. s/p afib ablation 2015.    Past Surgical History:  Procedure Laterality Date  . ABLATION  03-24-14   PVI by Dr Johney Frame  . ATRIAL FIBRILLATION ABLATION N/A 03/24/2014   Procedure: ATRIAL FIBRILLATION ABLATION;  Surgeon: Gardiner Rhyme, MD;  Location: [facility] CATH LAB;  Service: Cardiovascular;  Laterality: N/A;  . CYSTOSCOPY  11-09   neg  @ urology, was rx Flomax  . PROSTATE SURGERY     TUNA  . TEE WITHOUT CARDIOVERSION N/A 03/23/2014   Procedure: TRANSESOPHAGEAL ECHOCARDIOGRAM (TEE);  Surgeon: Pricilla Riffle, MD;  Location: [facility] ENDOSCOPY;  Service: Cardiovascular;  Laterality: N/A;  . VASECTOMY     several years ago    Social History   Social History  . Marital status: Married    Spouse name: N/A  . Number of children: 2  . Years of education: N/A   Occupational History  . Works for [employer] ([employer]  Drives a Scientist, product/process development [employer]    works for [employer] (state)   Social History Main Topics  . Smoking status: Former Smoker    Types: Cigarettes    Quit date: 06/26/1985  . Smokeless tobacco: Never Used  . Alcohol use No     Comment:    . Drug use: No  . Sexual activity: Not on file   Other Topics Concern  . Not on file   Social History Narrative  has a girlfriend, [place]               Allergies as of 10/26/2016   No Known Allergies     Medication List       Accurate as of 10/26/16  2:13 PM. Always use your most recent med list.          acetaminophen 650 MG CR tablet Commonly known as:  TYLENOL Take 650 mg by mouth every 8 (eight) hours as needed for pain.   azelastine 0.1 % nasal spray Commonly known as:  ASTELIN PLACE 2 SPRAYS INTO BOTH NOSTRILS EVERY NIGHT AT BEDTIME AS NEEDED FOR RHINITIS   ELIQUIS 5 MG Tabs tablet Generic drug:  apixaban TAKE 1 TABLET(5 MG) BY MOUTH TWICE DAILY   fish oil-omega-3 fatty acids 1000 MG capsule Take 2 g by mouth 2 (two) times daily.     fluticasone 50 MCG/ACT nasal spray Commonly known as:  FLONASE Place 2 sprays into both nostrils See admin instructions. IN THE MORNING AS NEEDED FOR RHINITIS   FREESTYLE LIBRE SENSOR SYSTEM Misc 1 strip by Does not apply route once a week. Apply to upper arm and change sensor every 10 days   gabapentin 600 MG tablet Commonly known as:  NEURONTIN Take 600 mg by mouth 2 (two) times daily.   glucagon 1 MG injection Commonly known as:  GLUCAGON EMERGENCY Inject 1 mg into the vein once as needed.   Insulin Pen Needle 31G X 8 MM Misc Commonly known as:  B-D ULTRAFINE III SHORT PEN Use one per day   INSULIN SYRINGE .5CC/31GX5/16" 31G X 5/16" 0.5 ML Misc USE ONE SYRINGE AS DIRECTED THREE TO FOUR TIMES DAILY   loratadine 10 MG tablet Commonly known as:  CLARITIN Take 10 mg by mouth daily.   multivitamin tablet Take 1 tablet by mouth daily.   NOVOLOG FLEXPEN 100 UNIT/ML FlexPen Generic drug:  insulin aspart Inject 5-10 Units into the skin 3 (three) times daily as needed for high blood sugar (IF BGL IS 100 OR GREATER).   ONETOUCH VERIO test strip Generic drug:  glucose blood TEST BLOOD SUGAR FOUR TIMES DAILY   simvastatin 20 MG tablet Commonly known as:  ZOCOR Take 0.5 tablets (10 mg total) by mouth at bedtime.   SYSTANE ULTRA PF 0.4-0.3 % Soln Generic drug:  Polyethyl Glyc-Propyl Glyc PF Apply 2 drops to eye 3 (three) times daily as needed (for dry eyes).   tamsulosin 0.4 MG Caps capsule Commonly known as:  FLOMAX Take 1 capsule (0.4 mg total) by mouth daily.   TRESIBA FLEXTOUCH 100 UNIT/ML Sopn FlexTouch Pen Generic drug:  insulin degludec Inject 25 Units into the skin at bedtime.   XIGDUO XR 10-998 MG Tb24 Generic drug:  Dapagliflozin-Metformin HCl ER TAKE ONE TABLET BY MOUTH TWICE DAILY -REPLACES  INVOKAMET  XR.          Objective:   Physical Exam  HENT:  Head:     BP 132/76 (BP Location: Left Arm, Patient Position: Sitting, Cuff Size: Normal)   Pulse  76   Temp 98.1 F (36.7 C) (Oral)   Resp 14   Ht 5\' 11"  (1.803 m)   Wt 208 lb 4 oz (94.5 kg)   SpO2 97%   BMI 29.04 kg/m  General:   Well developed, well nourished . NAD.  HEENT:  Normocephalic . Face symmetric, atraumatic Neck: Normal carotid pulses Lungs:  CTA B Normal respiratory effort, no intercostal retractions, no accessory muscle use.  Heart: RRR,  no murmur.  No pretibial edema bilaterally  Skin: Not pale. Not jaundice Neurologic:  alert & oriented X3.  Speech normal, gait appropriate for age and unassisted Face and motor symmetric. EOMI. Psych--  Cognition and judgment appear intact.  Cooperative with normal attention span and concentration.  Behavior appropriate. No anxious or depressed appearing.      Assessment & Plan:   Assessment DM, w/ neuropathy, Dr Lucianne Muss HTN Hyperlipidemia Atrial fibrillation, 2011, s/p ablation, now sinus rhythm, anticoagulated BPH s/p TUNA ED S/P shingles, postherpetic neuralgia R foot  PLAN TIA?: sx as described above, ER w/u w/ no evidence of stroke; ddx includes a TIA, hypoglycemia, hypotension although CBG upon arrival to ER ~ 147. Neurology suggested B12, folic acid, neurology referral. Will do  Abnormal neck MRI: Patient denies major problems with neck pain. 1. No acute finding or evidence of myelopathy. 2. C5-6 left paracentral disc protrusion contacting the cord without  compression. 3. C3-4 borderline spinal stenosis. Mild to moderate left foraminal stenosis.  DM: Last A1c 6.0. Currently on Tresiba, due to low CBGs,  has decreased doses from 60 u qd gradually to 35 u qd. Also on NovoLog TID PRN and Xigduo. Recommend to decrease tresiba to 25 ; will send a message to Dr. Lucianne Muss   RTC, CPX, 3 months

## 2016-10-26 NOTE — Progress Notes (Signed)
Pre visit review using our clinic review tool, if applicable. No additional management support is needed unless otherwise documented below in the visit note. 

## 2016-10-26 NOTE — Assessment & Plan Note (Signed)
TIA?: sx as described above, ER w/u w/ no evidence of stroke; ddx includes a TIA, hypoglycemia, hypotension although CBG upon arrival to ER ~ 147. Neurology suggested B12, folic acid, neurology referral. Will do  Abnormal neck MRI: Patient denies major problems with neck pain. 1. No acute finding or evidence of myelopathy. 2. C5-6 left paracentral disc protrusion contacting the cord without  compression. 3. C3-4 borderline spinal stenosis. Mild to moderate left foraminal stenosis.  DM: Last A1c 6.0. Currently on Tresiba, due to low CBGs,  has decreased doses from 60 u qd gradually to 35 u qd. Also on NovoLog TID PRN and Xigduo. Recommend to decrease tresiba to 25 ; will send a message to Dr. Lucianne MussKumar   RTC, CPX, 3 months

## 2016-10-26 NOTE — Patient Instructions (Signed)
GO TO THE LAB : Get the blood work     GO TO THE FRONT DESK Schedule your next appointment for a  Physical, 3 months   Tresiba: Decreased to 25 units  If you continue having blood sugars below 100, call Dr. Lucianne MussKumar

## 2016-10-31 ENCOUNTER — Encounter: Payer: Self-pay | Admitting: Internal Medicine

## 2016-10-31 ENCOUNTER — Observation Stay (HOSPITAL_BASED_OUTPATIENT_CLINIC_OR_DEPARTMENT_OTHER)
Admission: EM | Admit: 2016-10-31 | Discharge: 2016-11-02 | Disposition: A | Discharge status: Home / Self Care | Payer: BC Managed Care – PPO | Attending: Internal Medicine | Admitting: Internal Medicine

## 2016-10-31 ENCOUNTER — Encounter (HOSPITAL_BASED_OUTPATIENT_CLINIC_OR_DEPARTMENT_OTHER): Payer: Self-pay | Admitting: Emergency Medicine

## 2016-10-31 ENCOUNTER — Emergency Department (HOSPITAL_BASED_OUTPATIENT_CLINIC_OR_DEPARTMENT_OTHER): Payer: BC Managed Care – PPO

## 2016-10-31 ENCOUNTER — Ambulatory Visit (INDEPENDENT_AMBULATORY_CARE_PROVIDER_SITE_OTHER): Payer: BC Managed Care – PPO | Admitting: Internal Medicine

## 2016-10-31 VITALS — BP 132/80 | HR 77 | Temp 98.2°F | Resp 16 | Ht 71.0 in | Wt 208.2 lb

## 2016-10-31 DIAGNOSIS — R197 Diarrhea, unspecified: Secondary | ICD-10-CM

## 2016-10-31 DIAGNOSIS — R109 Unspecified abdominal pain: Secondary | ICD-10-CM | POA: Diagnosis not present

## 2016-10-31 DIAGNOSIS — R112 Nausea with vomiting, unspecified: Secondary | ICD-10-CM

## 2016-10-31 DIAGNOSIS — E1142 Type 2 diabetes mellitus with diabetic polyneuropathy: Secondary | ICD-10-CM | POA: Diagnosis not present

## 2016-10-31 DIAGNOSIS — Z8249 Family history of ischemic heart disease and other diseases of the circulatory system: Secondary | ICD-10-CM | POA: Diagnosis not present

## 2016-10-31 DIAGNOSIS — E1140 Type 2 diabetes mellitus with diabetic neuropathy, unspecified: Secondary | ICD-10-CM | POA: Insufficient documentation

## 2016-10-31 DIAGNOSIS — Z79899 Other long term (current) drug therapy: Secondary | ICD-10-CM | POA: Insufficient documentation

## 2016-10-31 DIAGNOSIS — M50222 Other cervical disc displacement at C5-C6 level: Secondary | ICD-10-CM | POA: Diagnosis not present

## 2016-10-31 DIAGNOSIS — Z794 Long term (current) use of insulin: Secondary | ICD-10-CM | POA: Diagnosis not present

## 2016-10-31 DIAGNOSIS — E669 Obesity, unspecified: Secondary | ICD-10-CM | POA: Diagnosis not present

## 2016-10-31 DIAGNOSIS — G4700 Insomnia, unspecified: Secondary | ICD-10-CM | POA: Diagnosis not present

## 2016-10-31 DIAGNOSIS — I10 Essential (primary) hypertension: Secondary | ICD-10-CM

## 2016-10-31 DIAGNOSIS — N400 Enlarged prostate without lower urinary tract symptoms: Secondary | ICD-10-CM | POA: Diagnosis not present

## 2016-10-31 DIAGNOSIS — Z6829 Body mass index (BMI) 29.0-29.9, adult: Secondary | ICD-10-CM | POA: Insufficient documentation

## 2016-10-31 DIAGNOSIS — K2971 Gastritis, unspecified, with bleeding: Secondary | ICD-10-CM

## 2016-10-31 DIAGNOSIS — K922 Gastrointestinal hemorrhage, unspecified: Secondary | ICD-10-CM | POA: Diagnosis not present

## 2016-10-31 DIAGNOSIS — G5603 Carpal tunnel syndrome, bilateral upper limbs: Secondary | ICD-10-CM | POA: Diagnosis not present

## 2016-10-31 DIAGNOSIS — E785 Hyperlipidemia, unspecified: Secondary | ICD-10-CM | POA: Insufficient documentation

## 2016-10-31 DIAGNOSIS — Z833 Family history of diabetes mellitus: Secondary | ICD-10-CM | POA: Insufficient documentation

## 2016-10-31 DIAGNOSIS — R05 Cough: Secondary | ICD-10-CM

## 2016-10-31 DIAGNOSIS — Z87891 Personal history of nicotine dependence: Secondary | ICD-10-CM | POA: Diagnosis not present

## 2016-10-31 DIAGNOSIS — Z7901 Long term (current) use of anticoagulants: Secondary | ICD-10-CM | POA: Insufficient documentation

## 2016-10-31 DIAGNOSIS — A0811 Acute gastroenteropathy due to Norwalk agent: Secondary | ICD-10-CM | POA: Diagnosis not present

## 2016-10-31 DIAGNOSIS — E7800 Pure hypercholesterolemia, unspecified: Secondary | ICD-10-CM | POA: Insufficient documentation

## 2016-10-31 DIAGNOSIS — J322 Chronic ethmoidal sinusitis: Secondary | ICD-10-CM | POA: Insufficient documentation

## 2016-10-31 DIAGNOSIS — R059 Cough, unspecified: Secondary | ICD-10-CM

## 2016-10-31 DIAGNOSIS — R1110 Vomiting, unspecified: Secondary | ICD-10-CM | POA: Diagnosis present

## 2016-10-31 DIAGNOSIS — I480 Paroxysmal atrial fibrillation: Secondary | ICD-10-CM | POA: Diagnosis not present

## 2016-10-31 DIAGNOSIS — J309 Allergic rhinitis, unspecified: Secondary | ICD-10-CM | POA: Diagnosis not present

## 2016-10-31 LAB — COMPREHENSIVE METABOLIC PANEL
ALBUMIN: 4.5 g/dL (ref 3.5–5.0)
ALT: 43 U/L (ref 17–63)
AST: 32 U/L (ref 15–41)
Alkaline Phosphatase: 72 U/L (ref 38–126)
Anion gap: 11 (ref 5–15)
BUN: 21 mg/dL — AB (ref 6–20)
CHLORIDE: 103 mmol/L (ref 101–111)
CO2: 26 mmol/L (ref 22–32)
Calcium: 9.2 mg/dL (ref 8.9–10.3)
Creatinine, Ser: 0.84 mg/dL (ref 0.61–1.24)
GFR calc Af Amer: >60 mL/min (ref >60)
GFR calc non Af Amer: >60 mL/min (ref >60)
GLUCOSE: 147 mg/dL — AB (ref 65–99)
POTASSIUM: 4.2 mmol/L (ref 3.5–5.1)
SODIUM: 140 mmol/L (ref 135–145)
Total Bilirubin: 0.7 mg/dL (ref 0.3–1.2)
Total Protein: 8.2 g/dL — ABNORMAL HIGH (ref 6.5–8.1)

## 2016-10-31 LAB — URINALYSIS, ROUTINE W REFLEX MICROSCOPIC
BILIRUBIN URINE: NEGATIVE
HGB URINE DIPSTICK: NEGATIVE
KETONES UR: NEGATIVE mg/dL
Leukocytes, UA: NEGATIVE
Nitrite: NEGATIVE
PROTEIN: NEGATIVE mg/dL
Specific Gravity, Urine: >1.046 — ABNORMAL HIGH (ref 1.005–1.030)
pH: 5.5 (ref 5.0–8.0)

## 2016-10-31 LAB — C DIFFICILE QUICK SCREEN W PCR REFLEX
C DIFFICILE (CDIFF) INTERP: NOT DETECTED
C DIFFICILE (CDIFF) TOXIN: NEGATIVE
C Diff antigen: NEGATIVE

## 2016-10-31 LAB — CBC
HEMATOCRIT: 53.7 % — AB (ref 39.0–52.0)
HEMOGLOBIN: 18.6 g/dL — AB (ref 13.0–17.0)
MCH: 28.1 pg (ref 26.0–34.0)
MCHC: 34.6 g/dL (ref 30.0–36.0)
MCV: 81.1 fL (ref 78.0–100.0)
Platelets: 255 10*3/uL (ref 150–400)
RBC: 6.62 MIL/uL — ABNORMAL HIGH (ref 4.22–5.81)
RDW: 15.1 % (ref 11.5–15.5)
WBC: 12.9 10*3/uL — ABNORMAL HIGH (ref 4.0–10.5)

## 2016-10-31 LAB — URINALYSIS, MICROSCOPIC (REFLEX): RBC / HPF: NONE SEEN RBC/hpf (ref 0–5)

## 2016-10-31 LAB — CBG MONITORING, ED: GLUCOSE-CAPILLARY: 144 mg/dL — AB (ref 65–99)

## 2016-10-31 LAB — LIPASE, BLOOD: LIPASE: 18 U/L (ref 11–51)

## 2016-10-31 LAB — OCCULT BLOOD X 1 CARD TO LAB, STOOL: FECAL OCCULT BLD: POSITIVE — AB

## 2016-10-31 MED ORDER — DIPHENOXYLATE-ATROPINE 2.5-0.025 MG PO TABS
2.0000 | ORAL_TABLET | Freq: Once | ORAL | Status: AC
  Administered 2016-10-31: 2 via ORAL
  Filled 2016-10-31: qty 2

## 2016-10-31 MED ORDER — METRONIDAZOLE IN NACL 5-0.79 MG/ML-% IV SOLN
500.0000 mg | Freq: Once | INTRAVENOUS | Status: AC
  Administered 2016-10-31: 500 mg via INTRAVENOUS
  Filled 2016-10-31: qty 100

## 2016-10-31 MED ORDER — GABAPENTIN 300 MG PO CAPS
600.0000 mg | ORAL_CAPSULE | Freq: Two times a day (BID) | ORAL | Status: DC
  Administered 2016-10-31 – 2016-11-02 (×4): 600 mg via ORAL
  Filled 2016-10-31 (×4): qty 2

## 2016-10-31 MED ORDER — APIXABAN 5 MG PO TABS: 5.0000 mg | ORAL_TABLET | Freq: Two times a day (BID) | ORAL | Status: DC

## 2016-10-31 MED ORDER — IOPAMIDOL (ISOVUE-300) INJECTION 61%
100.0000 mL | Freq: Once | INTRAVENOUS | Status: AC | PRN
  Administered 2016-10-31: 100 mL via INTRAVENOUS

## 2016-10-31 MED ORDER — LORATADINE 10 MG PO TABS
10.0000 mg | ORAL_TABLET | Freq: Every day | ORAL | Status: DC
  Administered 2016-10-31 – 2016-11-02 (×3): 10 mg via ORAL
  Filled 2016-10-31 (×3): qty 1

## 2016-10-31 MED ORDER — SODIUM CHLORIDE 0.9 % IV SOLN
80.0000 mg | Freq: Once | INTRAVENOUS | Status: AC
  Administered 2016-10-31: 80 mg via INTRAVENOUS
  Filled 2016-10-31: qty 80

## 2016-10-31 MED ORDER — ACETAMINOPHEN 325 MG PO TABS
650.0000 mg | ORAL_TABLET | ORAL | Status: DC | PRN
  Administered 2016-10-31 – 2016-11-01 (×2): 650 mg via ORAL
  Filled 2016-10-31 (×2): qty 2

## 2016-10-31 MED ORDER — SODIUM CHLORIDE 0.9 % IV SOLN
INTRAVENOUS | Status: DC
  Administered 2016-10-31 – 2016-11-02 (×3): via INTRAVENOUS

## 2016-10-31 MED ORDER — SIMVASTATIN 10 MG PO TABS
10.0000 mg | ORAL_TABLET | Freq: Every day | ORAL | Status: DC
  Administered 2016-10-31 – 2016-11-01 (×2): 10 mg via ORAL
  Filled 2016-10-31 (×2): qty 1

## 2016-10-31 MED ORDER — INSULIN GLARGINE 100 UNIT/ML ~~LOC~~ SOLN
20.0000 [IU] | Freq: Every day | SUBCUTANEOUS | Status: DC
  Administered 2016-10-31 – 2016-11-01 (×2): 20 [IU] via SUBCUTANEOUS
  Filled 2016-10-31 (×3): qty 0.2

## 2016-10-31 MED ORDER — ONDANSETRON HCL 4 MG/2ML IJ SOLN
4.0000 mg | Freq: Once | INTRAMUSCULAR | Status: AC
  Administered 2016-10-31: 4 mg via INTRAVENOUS
  Filled 2016-10-31: qty 2

## 2016-10-31 MED ORDER — INSULIN ASPART 100 UNIT/ML ~~LOC~~ SOLN
0.0000 [IU] | SUBCUTANEOUS | Status: DC
  Administered 2016-10-31: 3 [IU] via SUBCUTANEOUS
  Administered 2016-11-01 – 2016-11-02 (×2): 2 [IU] via SUBCUTANEOUS

## 2016-10-31 MED ORDER — SODIUM CHLORIDE 0.9 % IV BOLUS (SEPSIS)
1000.0000 mL | Freq: Once | INTRAVENOUS | Status: AC
  Administered 2016-10-31: 1000 mL via INTRAVENOUS

## 2016-10-31 MED ORDER — ONDANSETRON 4 MG PO TBDP: 4.0000 mg | ORAL_TABLET | Freq: Three times a day (TID) | ORAL | Status: DC | PRN

## 2016-10-31 MED ORDER — CIPROFLOXACIN IN D5W 400 MG/200ML IV SOLN
400.0000 mg | Freq: Once | INTRAVENOUS | Status: AC
Start: 2016-10-31 — End: 2016-10-31
  Administered 2016-10-31: 400 mg via INTRAVENOUS
  Filled 2016-10-31: qty 200

## 2016-10-31 MED ORDER — ONDANSETRON HCL 4 MG/2ML IJ SOLN: 4.0000 mg | Freq: Three times a day (TID) | INTRAMUSCULAR | Status: DC | PRN

## 2016-10-31 MED ORDER — ACETAMINOPHEN 325 MG PO TABS
650.0000 mg | ORAL_TABLET | Freq: Once | ORAL | Status: AC
  Administered 2016-10-31: 650 mg via ORAL
  Filled 2016-10-31: qty 2

## 2016-10-31 MED ORDER — TAMSULOSIN HCL 0.4 MG PO CAPS
0.4000 mg | ORAL_CAPSULE | Freq: Every day | ORAL | Status: DC
  Administered 2016-10-31 – 2016-11-02 (×3): 0.4 mg via ORAL
  Filled 2016-10-31 (×3): qty 1

## 2016-10-31 MED ORDER — PANTOPRAZOLE SODIUM 40 MG IV SOLR
INTRAVENOUS | Status: AC
  Administered 2016-10-31: 80 mg
  Filled 2016-10-31: qty 80

## 2016-10-31 NOTE — Progress Notes (Signed)
Subjective:    Patient ID: Danny [name], male    DOB: 08-Nov-1960, 56 y.o.   MRN: [id number]  DOS:  10/31/2016 Type of visit - description : Acute Interval history: Woke up this morning with moderate to severe mid abdominal pain associated with nausea. He vomited 4 times, the last time he saw streaks of blood mixed with the emesis but no overt hematemesis. Has been monitor his blood sugars and they have been 128, 152 this morning. BP has been okay. No other people affected in his family with similar symptoms. Appetite was okay up until last night when she went to eat at [employer]   Review of Systems No fever chills No blood in the stools, actually his last BM was today and look normal. No diarrhea. No dysuria or gross hematuria. He still has the facial numbness, see last visit.  Past Medical History:  Diagnosis Date  . Allergic rhinitis   . Benign prostatic hypertrophy    s/p TUNA procedure aprx 2006,  . Carpal tunnel syndrome 06/17/2009  . Diabetes mellitus    Type II dx 1999  . 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 ED (erectile dysfunction)    After TUNA  . Elevated LFTs 2010   (-) chronic hep  panel  . History of cardiovascular stress test 8/11   Negative  . Hyperlipidemia   . HYPERSOMNIA UNSPECIFIED 12/11/2007  . Hypertension   . INSOMNIA-SLEEP DISORDER-UNSPEC 04/12/2010  . Obesity   . PAF (paroxysmal atrial fibrillation) (HCC) 12/21/2009   a. Failed Multaq. b. Prev on flecainide. c. s/p afib ablation 2015.    Past Surgical History:  Procedure Laterality Date  . ABLATION  03-24-14   PVI by Dr Johney Frame  . ATRIAL FIBRILLATION ABLATION N/A 03/24/2014   Procedure: ATRIAL FIBRILLATION ABLATION;  Surgeon: Gardiner Rhyme, MD;  Location: [facility] CATH LAB;  Service: Cardiovascular;  Laterality: N/A;  . CYSTOSCOPY  11-09   neg  @ urology, was rx Flomax  . PROSTATE SURGERY     TUNA  . TEE WITHOUT CARDIOVERSION N/A 03/23/2014   Procedure: TRANSESOPHAGEAL  ECHOCARDIOGRAM (TEE);  Surgeon: Pricilla Riffle, MD;  Location: [facility] ENDOSCOPY;  Service: Cardiovascular;  Laterality: N/A;  . VASECTOMY     several years ago    Social History   Social History  . Marital status: Married    Spouse name: N/A  . Number of children: 2  . Years of education: N/A   Occupational History  . Works for [employer] (state).  Drives a Scientist, product/process development [employer]    works for [employer] (state)   Social History Main Topics  . Smoking status: Former Smoker    Types: Cigarettes    Quit date: 06/26/1985  . Smokeless tobacco: Never Used  . Alcohol use No     Comment:    . Drug use: No  . Sexual activity: Not on file   Other Topics Concern  . Not on file   Social History Narrative   has a girlfriend, lives in [place]               Allergies as of 10/31/2016   No Known Allergies     Medication List       Accurate as of 10/31/16  9:14 AM. Always use your most recent med list.          acetaminophen 650 MG CR tablet Commonly known as:  TYLENOL Take 650 mg by mouth every 8 (  eight) hours as needed for pain.   azelastine 0.1 % nasal spray Commonly known as:  ASTELIN PLACE 2 SPRAYS INTO BOTH NOSTRILS EVERY NIGHT AT BEDTIME AS NEEDED FOR RHINITIS   ELIQUIS 5 MG Tabs tablet Generic drug:  apixaban TAKE 1 TABLET(5 MG) BY MOUTH TWICE DAILY   fish oil-omega-3 fatty acids 1000 MG capsule Take 2 g by mouth 2 (two) times daily.   fluticasone 50 MCG/ACT nasal spray Commonly known as:  FLONASE Place 2 sprays into both nostrils See admin instructions. IN THE MORNING AS NEEDED FOR RHINITIS   FREESTYLE LIBRE SENSOR SYSTEM Misc 1 strip by Does not apply route once a week. Apply to upper arm and change sensor every 10 days   gabapentin 600 MG tablet Commonly known as:  NEURONTIN Take 600 mg by mouth 2 (two) times daily.   glucagon 1 MG injection Commonly known as:  GLUCAGON EMERGENCY Inject 1 mg into the vein once as needed.   Insulin Pen Needle 31G X 8 MM Misc Commonly  known as:  B-D ULTRAFINE III SHORT PEN Use one per day   INSULIN SYRINGE .5CC/31GX5/16" 31G X 5/16" 0.5 ML Misc USE ONE SYRINGE AS DIRECTED THREE TO FOUR TIMES DAILY   loratadine 10 MG tablet Commonly known as:  CLARITIN Take 10 mg by mouth daily.   multivitamin tablet Take 1 tablet by mouth daily.   NOVOLOG FLEXPEN 100 UNIT/ML FlexPen Generic drug:  insulin aspart Inject 5-10 Units into the skin 3 (three) times daily as needed for high blood sugar (IF BGL IS 100 OR GREATER).   ONETOUCH VERIO test strip Generic drug:  glucose blood TEST BLOOD SUGAR FOUR TIMES DAILY   simvastatin 20 MG tablet Commonly known as:  ZOCOR Take 0.5 tablets (10 mg total) by mouth at bedtime.   SYSTANE ULTRA PF 0.4-0.3 % Soln Generic drug:  Polyethyl Glyc-Propyl Glyc PF Apply 2 drops to eye 3 (three) times daily as needed (for dry eyes).   tamsulosin 0.4 MG Caps capsule Commonly known as:  FLOMAX Take 1 capsule (0.4 mg total) by mouth daily.   TRESIBA FLEXTOUCH 100 UNIT/ML Sopn FlexTouch Pen Generic drug:  insulin degludec Inject 25 Units into the skin at bedtime.   XIGDUO XR 10-998 MG Tb24 Generic drug:  Dapagliflozin-Metformin HCl ER TAKE ONE TABLET BY MOUTH TWICE DAILY -REPLACES  INVOKAMET  XR.          Objective:   Physical Exam BP 132/80 (BP Location: Right Arm, Patient Position: Supine, Cuff Size: Normal)   Pulse 77   Temp 98.2 F (36.8 C) (Oral)   Resp 16   Ht 5\' 11"  (1.803 m)   Wt 208 lb 4 oz (94.5 kg)   SpO2 99%   BMI 29.04 kg/m  General:   Well developed, looks uncomfortable due to nausea and pain. Not toxic appearing HEENT:  Normocephalic . Face symmetric, atraumatic. Membranes dry Lungs:  CTA B Normal respiratory effort, no intercostal retractions, no accessory muscle use. Heart: RRR,  no murmur.  no pretibial edema bilaterally  Abdomen:  Slightly increased bowel sounds, diffuse mild to moderate tenderness particularly at the mid abdomen. Skin: Not pale. Not  jaundice Neurologic:  alert & oriented X3.  Speech normal, gait not tested, he was either on the wheelchair or the examining table. Psych--  Cognition and judgment appear intact.  Cooperative with normal attention span and concentration.  Behavior appropriate. No anxious or depressed appearing.    Assessment & Plan:   Assessment DM,  w/ neuropathy, Dr Lucianne Muss HTN Hyperlipidemia Atrial fibrillation, 2011, s/p ablation, now sinus rhythm, anticoagulated BPH s/p TUNA ED S/P shingles, postherpetic neuralgia R foot  PLAN Abdominal pain, nausea: Acute onset of symptoms few hours ago, on exam he looks uncomfortable, + abdominal tenderness on exam without rebound. Unable to tolerate fluids. Did see strikes of blood the fourth time he vomited, no overt hematemesis, Mallory-Weiss ? Needs further treatment and eval. Spoke with the charge nurse at the ER , we escorted the patient downstairs. TIA? Today he still reports numbness as described on the last office visit.

## 2016-10-31 NOTE — Patient Instructions (Signed)
Please go to the ER downstairs 

## 2016-10-31 NOTE — Progress Notes (Signed)
Pre visit review using our clinic review tool, if applicable. No additional management support is needed unless otherwise documented below in the visit note. 

## 2016-10-31 NOTE — H&P (Addendum)
History and Physical    Danny [name] WUJ:[id number] DOB: May 17, 1961 DOA: 10/31/2016  PCP: Wanda Plump, MD  Patient coming from: Home  Chief Complaint: Emesis and diarrhea  HPI: Danny [name] is a 56 y.o. male with medical history significant of diabetes mellitus. Symptoms started with episodes of emesis since 4:30AM. He reports 5-6 episodes. Watery stools started around 8:45AM when he got to [facility]. He reports about 7-8 watery stools since then. He started taking water to rehydrate himself.   ED Course: Vitals: Fever of 101.3, normal pulse, normal respirations, soft blood pressures in the 100-110s Labs: Glucose of 147. WBC 12.9. Hemoglobin 18.6. Fecal occult positive Imaging: CT abdomen/pelvis unremarkable. Medications/Course: Ciprofloxacin and Flagyl, Protonix, 2L NS boluses, zofran  Review of Systems: Review of Systems  Constitutional: Positive for chills and fever. Negative for diaphoresis.  Respiratory: Negative for cough, hemoptysis, sputum production, shortness of breath and wheezing.   Cardiovascular: Negative for chest pain.  Gastrointestinal: Positive for diarrhea, melena, nausea and vomiting. Negative for abdominal pain, blood in stool and constipation.       Bloody streaks in emesis  Genitourinary: Negative for dysuria.  Neurological: Positive for weakness.  Endo/Heme/Allergies: Positive for polydipsia.  All other systems reviewed and are negative.   Past Medical History:  Diagnosis Date  . Allergic rhinitis   . Benign prostatic hypertrophy    s/p TUNA procedure aprx 2006,  . Carpal tunnel syndrome 06/17/2009  . Diabetes mellitus    Type II dx 1999  . 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 ED (erectile dysfunction)    After TUNA  . Elevated LFTs 2010   (-) chronic hep  panel  . History of cardiovascular stress test 8/11   Negative  . Hyperlipidemia   . HYPERSOMNIA UNSPECIFIED 12/11/2007  . Hypertension   . INSOMNIA-SLEEP  DISORDER-UNSPEC 04/12/2010  . Obesity   . PAF (paroxysmal atrial fibrillation) (HCC) 12/21/2009   a. Failed Multaq. b. Prev on flecainide. c. s/p afib ablation 2015.    Past Surgical History:  Procedure Laterality Date  . ABLATION  03-24-14   PVI by Dr Johney Frame  . ATRIAL FIBRILLATION ABLATION N/A 03/24/2014   Procedure: ATRIAL FIBRILLATION ABLATION;  Surgeon: Gardiner Rhyme, MD;  Location: [facility] CATH LAB;  Service: Cardiovascular;  Laterality: N/A;  . CYSTOSCOPY  11-09   neg  @ urology, was rx Flomax  . PROSTATE SURGERY     TUNA  . TEE WITHOUT CARDIOVERSION N/A 03/23/2014   Procedure: TRANSESOPHAGEAL ECHOCARDIOGRAM (TEE);  Surgeon: Pricilla Riffle, MD;  Location: [facility] ENDOSCOPY;  Service: Cardiovascular;  Laterality: N/A;  . VASECTOMY     several years ago     reports that he quit smoking about 31 years ago. His smoking use included Cigarettes. He has never used smokeless tobacco. He reports that he does not drink alcohol or use drugs.  No Known Allergies  Family History  Problem Relation Age of Onset  . Heart disease Mother     M-- MI,CHF  . Diabetes Mother   . Hypertension Mother   . Hypertension Father   . Cancer Neg Hx     Negative FH of Colon or Prostate Cancer   Prior to Admission medications   Medication Sig Start Date End Date Taking? Authorizing Provider  acetaminophen (TYLENOL) 650 MG CR tablet Take 650 mg by mouth every 8 (eight) hours as needed for pain.    [provider]  azelastine (ASTELIN) 0.1 % nasal spray  PLACE 2 SPRAYS INTO BOTH NOSTRILS EVERY NIGHT AT BEDTIME AS NEEDED FOR RHINITIS 12/27/15   Wanda Plump, MD  Continuous Blood Gluc Sensor (FREESTYLE LIBRE SENSOR SYSTEM) MISC 1 strip by Does not apply route once a week. Apply to upper arm and change sensor every 10 days Patient not taking: Reported on 10/26/2016 09/20/16   Reather Littler, MD  ELIQUIS 5 MG TABS tablet TAKE 1 TABLET(5 MG) BY MOUTH TWICE DAILY Patient taking differently: Take 5 mg by mouth two times a  day 08/17/16   Hillis Range, MD  fish oil-omega-3 fatty acids 1000 MG capsule Take 2 g by mouth 2 (two) times daily.      [provider]  fluticasone (FLONASE) 50 MCG/ACT nasal spray Place 2 sprays into both nostrils See admin instructions. IN THE MORNING AS NEEDED FOR RHINITIS    [provider]  gabapentin (NEURONTIN) 600 MG tablet Take 600 mg by mouth 2 (two) times daily.    [provider]  glucagon (GLUCAGON EMERGENCY) 1 MG injection Inject 1 mg into the vein once as needed. Patient not taking: Reported on 10/26/2016 02/18/14   Reather Littler, MD  insulin aspart (NOVOLOG FLEXPEN) 100 UNIT/ML FlexPen Inject 5-10 Units into the skin 3 (three) times daily as needed for high blood sugar (IF BGL IS 100 OR GREATER).     [provider]  insulin degludec (TRESIBA FLEXTOUCH) 100 UNIT/ML SOPN FlexTouch Pen Inject 25 Units into the skin at bedtime.     [provider]  Insulin Pen Needle (B-D ULTRAFINE III SHORT PEN) 31G X 8 MM MISC Use one per day Patient not taking: Reported on 10/26/2016 02/08/15   Reather Littler, MD  Insulin Syringe-Needle U-100 (INSULIN SYRINGE .5CC/31GX5/16") 31G X 5/16" 0.5 ML MISC USE ONE SYRINGE AS DIRECTED THREE TO FOUR TIMES DAILY Patient not taking: Reported on 10/26/2016 05/04/14   Reather Littler, MD  loratadine (CLARITIN) 10 MG tablet Take 10 mg by mouth daily.     [provider]  Multiple Vitamin (MULTIVITAMIN) tablet Take 1 tablet by mouth daily.    [provider]  Lum Babe test strip TEST BLOOD SUGAR FOUR TIMES DAILY Patient not taking: Reported on 10/26/2016 04/26/16   Reather Littler, MD  Polyethyl Glyc-Propyl Glyc PF (SYSTANE ULTRA PF) 0.4-0.3 % SOLN Apply 2 drops to eye 3 (three) times daily as needed (for dry eyes).    [provider]  simvastatin (ZOCOR) 20 MG tablet Take 0.5 tablets (10 mg total) by mouth at bedtime. 05/22/16   Wanda Plump, MD  tamsulosin (FLOMAX) 0.4 MG CAPS capsule Take 1 capsule (0.4 mg  total) by mouth daily. 06/20/16   Wanda Plump, MD  XIGDUO XR 10-998 MG TB24 TAKE ONE TABLET BY MOUTH TWICE DAILY -REPLACES  INVOKAMET  XR. 07/26/16   Reather Littler, MD    Physical Exam: Vitals:   10/31/16 1530 10/31/16 1600 10/31/16 1610 10/31/16 1726  BP: (!) 114/55 (!) 102/52  116/64  Pulse: 68 73  64  Resp: 14 20  18   Temp:   98 F (36.7 C) 98.7 F (37.1 C)  TempSrc:   Oral Oral  SpO2: 98% 94%  98%  Weight:    95.8 kg (211 lb 3.2 oz)  Height:    5\' 11"  (1.803 m)     Constitutional: NAD, calm, comfortable Eyes: PERRL, lids and conjunctivae normal ENMT: Mucous membranes are moist. Posterior pharynx clear of any exudate or lesions.  Neck: normal, supple, no masses,  no thyromegaly Respiratory: clear to auscultation bilaterally, no wheezing, no crackles. Normal respiratory effort. No accessory muscle use.  Cardiovascular: Regular rate and rhythm, no murmurs / rubs / gallops. No extremity edema. 2+ pedal pulses. No carotid bruits.  Abdomen: no tenderness, no masses palpated. No hepatosplenomegaly. Bowel sounds positive.  Musculoskeletal: no clubbing / cyanosis. No joint deformity upper and lower extremities. Good ROM, no contractures. Normal muscle tone.  Skin: no rashes, lesions, ulcers. No induration Neurologic: CN 2-12 grossly intact. Sensation intact, DTR normal. Strength 5/5 in all 4.  Psychiatric: Normal judgment and insight. Alert and oriented x 3. Normal mood.    Labs on Admission: I have personally reviewed following labs and imaging studies  CBC:  Recent Labs Lab 10/25/16 1329 10/25/16 1340 10/31/16 0920  WBC 7.0  --  12.9*  NEUTROABS 4.1  --   --   HGB 16.5 16.7 18.6*  HCT 48.5 49.0 53.7*  MCV 82.3  --  81.1  PLT 232  --  255   Basic Metabolic Panel:  Recent Labs Lab 10/25/16 1329 10/25/16 1340 10/31/16 0920  NA 137 140 140  K 3.9 3.7 4.2  CL 104 101 103  CO2 24  --  26  GLUCOSE 147* 147* 147*  BUN 16 17 21*  CREATININE 0.91 0.80 0.84  CALCIUM 8.7*   --  9.2   GFR: Estimated Creatinine Clearance: 114.6 mL/min (by C-G formula based on SCr of 0.84 mg/dL). Liver Function Tests:  Recent Labs Lab 10/25/16 1329 10/31/16 0920  AST 28 32  ALT 36 43  ALKPHOS 76 72  BILITOT 0.5 0.7  PROT 6.7 8.2*  ALBUMIN 3.9 4.5    Recent Labs Lab 10/31/16 0920  LIPASE 18   No results for input(s): AMMONIA in the last 168 hours. Coagulation Profile:  Recent Labs Lab 10/25/16 1329  INR 1.01   Cardiac Enzymes: No results for input(s): CKTOTAL, CKMB, CKMBINDEX, TROPONINI in the last 168 hours. BNP (last 3 results) No results for input(s): PROBNP in the last 8760 hours. HbA1C: No results for input(s): HGBA1C in the last 72 hours. CBG:  Recent Labs Lab 10/25/16 1327 10/31/16 0922  GLUCAP 152* 144*   Lipid Profile: No results for input(s): CHOL, HDL, LDLCALC, TRIG, CHOLHDL, LDLDIRECT in the last 72 hours. Thyroid Function Tests: No results for input(s): TSH, T4TOTAL, FREET4, T3FREE, THYROIDAB in the last 72 hours. Anemia Panel: No results for input(s): VITAMINB12, FOLATE, FERRITIN, TIBC, IRON, RETICCTPCT in the last 72 hours. Urine analysis:    Component Value Date/Time   COLORURINE YELLOW 10/31/2016 1220   APPEARANCEUR CLEAR 10/31/2016 1220   LABSPEC >1.046 (H) 10/31/2016 1220   PHURINE 5.5 10/31/2016 1220   GLUCOSEU >=500 (A) 10/31/2016 1220   GLUCOSEU >=1000 (A) 04/10/2016 0902   HGBUR NEGATIVE 10/31/2016 1220   HGBUR negative 11/05/2009 1538   BILIRUBINUR NEGATIVE 10/31/2016 1220   BILIRUBINUR negative 07/06/2016 1728   KETONESUR NEGATIVE 10/31/2016 1220   PROTEINUR NEGATIVE 10/31/2016 1220   UROBILINOGEN 4.0 07/06/2016 1728   UROBILINOGEN 0.2 04/10/2016 0902   NITRITE NEGATIVE 10/31/2016 1220   LEUKOCYTESUR NEGATIVE 10/31/2016 1220   Sepsis Labs: !!!!!!!!!!!!!!!!!!!!!!!!!!!!!!!!!!!!!!!!!!!! @LABRCNTIP (procalcitonin:4,lacticidven:4) )No results found for this or any previous visit (from the past 240 hour(s)).    Radiological Exams on Admission: Ct Abdomen Pelvis W Contrast  Result Date: 10/31/2016 CLINICAL DATA:  Diffuse/ right lower quadrant abdominal pain EXAM: CT ABDOMEN AND PELVIS WITH CONTRAST TECHNIQUE: Multidetector CT imaging of the abdomen and pelvis was performed using the standard  protocol following bolus administration of intravenous contrast. CONTRAST:  ISOVUE-300 IOPAMIDOL (ISOVUE-300) INJECTION 61% COMPARISON:  08/25/2016 FINDINGS: Lower chest: Lungs are essentially clear. Hepatobiliary: Liver is within normal limits. Gallbladder is within normal limits. No intrahepatic or extrahepatic ductal dilatation. Pancreas: Within normal limits. Spleen: Within normal limits. Adrenals/Urinary Tract: Adrenal glands are within normal limits. Kidneys are within normal limits.  No hydronephrosis. Bladder is within normal limits. Stomach/Bowel: Stomach is within normal limits. No evidence of bowel obstruction. Normal appendix (series 2/ image 58). Vascular/Lymphatic: Atherosclerotic calcifications of the aortic arch. No suspicious abdominopelvic lymphadenopathy. 8 mm aortocaval node (series 2/ image 49). Reproductive: Prostate is unremarkable. Other: No abdominopelvic ascites. Musculoskeletal: Degenerative changes of the visualized thoracolumbar spine. IMPRESSION: No evidence bowel obstruction.  Normal appendix. No CT findings to account for the patient's abdominal pain. Electronically Signed   By: Charline Bills M.D.   On: 10/31/2016 11:29    EKG: None obtained  Assessment/Plan Principal Problem:   GI bleed Active Problems:   Hypertension   PAF (paroxysmal atrial fibrillation) (HCC)   DM type 2 with diabetic peripheral neuropathy (HCC)   Nausea, vomiting and diarrhea  GI bleed Unsure if this is actually a bleed in the setting of gastroenteritis symptoms. Would favor gastroenteritis as primary diagnosis. No hematochezia, hematemesis or anemia. -Gastroenterology consulted in ED, await  recommendations] -NPO after midnight in case GI wants to perform intervention.   Nausea, vomiting, diarrhea Favor viral gastroenteritis. No recent history of antibiotics. C. Difficile low on differential. WBC is slightly elevated. -continue IV fluids -clear liquids as tolerated -Zofran prn -discontinue Cipro/Flagyl  Diabetes mellitus with peripheral neuropathy Last A1C of 6.0 on 08/18/16 -Lantus 20 units (20% decrease from home regimen) -SSI -continue gabapentin  Paroxysmal atrial fibrillation S/p ablation. Sinus on exam. No EKG available -EKG -hold Eliquis in setting of ?GI bleed  Essential hypertension Not on medications -watch BPs  Hypercholesterolemia -continue simvastatin  BPH -continue home flomax  DVT prophylaxis: SCDs Code Status: Full code Family Communication: None at bedside Disposition Plan: Anticipate discharge home tomorrow if taking oral nutrition without significant emesis Consults called: Gastroenterology by EDP (Dr. Tera Helper) Admission status: Observation, medical floor   Jacquelin Hawking, MD [facility] Pager [phone]  If 7PM-7AM, please contact night-coverage [email or web address] Password [facility]  10/31/2016, 6:16 PM

## 2016-10-31 NOTE — ED Notes (Signed)
ED Provider at bedside. 

## 2016-10-31 NOTE — Assessment & Plan Note (Signed)
Abdominal pain, nausea: Acute onset of symptoms few hours ago, on exam he looks uncomfortable, + abdominal tenderness on exam without rebound. Unable to tolerate fluids. Did see strikes of blood the fourth time he vomited, no overt hematemesis, Mallory-Weiss ? Needs further treatment and eval. Spoke with the charge nurse at the ER , we escorted the patient downstairs. TIA? Today he still reports numbness as described on the last office visit.

## 2016-10-31 NOTE — ED Notes (Signed)
Patient back from the restroom and shivering. States that he is cold through out. Patient placed on monitor and vitals check. Dr. Silverio LayYao aware of this change

## 2016-10-31 NOTE — ED Provider Notes (Signed)
[facility] Provider Note   CSN: [id number] Arrival date & time: 10/31/16  0907     History   Chief Complaint Chief Complaint  Patient presents with  . Abdominal Pain    HPI Danny [name] is a 56 y.o. male who presents to the Emergency Department with vomiting x4 that began at 04:00 AM this morning. He reports associated nausea, and diffuse abdominal pain. He reports he was feeling at his baseline when he went to bed last night. He states the abdominal pain initially was worse all over and radiated down to his right groin. He also reports dyspnea w/ vomiting. He states that he had an appointment this morning with his PCP and was sent to the ED after he mentioned blood-streaked emesis. No overt hematemesis. No melena or hematochezia. He reports a HA and dizziness while sitting in wheelchair in ED. He also reports bilateral low back, worse on the right that began this AM. He denies CP, testiular pain or rectal pain. No h/o of abdominal surgeries.  He reports he was recently evaluated in the ED on 5/2 and a Code stroke was called because of acute  numbness to chin. CT and MRI were negative, and the patient was instructed to follow up outpatient.     The history is provided by the patient and the spouse. No language interpreter was used.    Past Medical History:  Diagnosis Date  . Allergic rhinitis   . Benign prostatic hypertrophy    s/p TUNA procedure aprx 2006,  . Carpal tunnel syndrome 06/17/2009  . Diabetes mellitus    Type II dx 1999  . 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 ED (erectile dysfunction)    After TUNA  . Elevated LFTs 2010   (-) chronic hep  panel  . History of cardiovascular stress test 8/11   Negative  . Hyperlipidemia   . HYPERSOMNIA UNSPECIFIED 12/11/2007  . Hypertension   . INSOMNIA-SLEEP DISORDER-UNSPEC 04/12/2010  . Obesity   . PAF (paroxysmal atrial fibrillation) (HCC) 12/21/2009   a. Failed Multaq. b. Prev on  flecainide. c. s/p afib ablation 2015.    Patient Active Problem List   Diagnosis Date Noted  . Abdominal pain, vomiting, and diarrhea   . Norovirus   . GI bleed 10/31/2016  . Nausea, vomiting and diarrhea 10/31/2016  . Gross hematuria 07/26/2016  . Obesity 01/21/2016  . PCP NOTES >>>>>>>>>>>>>>>>>>>>>>> 08/12/2015  . 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 Bilateral foot pain 09/10/2014  . Skin infection 09/10/2014  . Shingles-- Post herpetic neuralgia R foot  08/06/2013  . Annual physical exam 01/13/2011  . Hyperlipidemia   . Hypertension   . PAF (paroxysmal atrial fibrillation) (HCC) 12/21/2009  . DYSLIPIDEMIA 12/14/2009  . CARPAL TUNNEL SYNDROME 06/17/2009  . DMII (diabetes mellitus, type 2) (HCC) 03/12/2007  . ALLERGIC RHINITIS 03/12/2007  . BENIGN PROSTATIC HYPERTROPHY 03/12/2007  . 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 ABLATION  03-24-14   PVI by Dr Johney Frame  . ATRIAL FIBRILLATION ABLATION N/A 03/24/2014   Procedure: ATRIAL FIBRILLATION ABLATION;  Surgeon: Gardiner Rhyme, MD;  Location: [facility] CATH [facility];  Service: Cardiovascular;  Laterality: N/A;  . CYSTOSCOPY  11-09   neg  @ urology, was rx Flomax  . PROSTATE SURGERY     TUNA  . TEE WITHOUT CARDIOVERSION N/A 03/23/2014   Procedure: TRANSESOPHAGEAL ECHOCARDIOGRAM (TEE);  Surgeon: Gunnar Fusi  Ronnell Freshwateross V, MD;  [facility];  Service: Cardiovascular;  Laterality: N/A;  . VASECTOMY     several years ago       Home Medications    Prior to Admission medications   Medication Sig Start Date End Date Taking? Authorizing Provider  acetaminophen (TYLENOL) 650 MG CR tablet Take 650 mg by mouth every 8 (eight) hours as needed for pain.   Yes [provider]  azelastine (ASTELIN) 0.1 % nasal spray PLACE 2 SPRAYS INTO BOTH NOSTRILS EVERY NIGHT AT BEDTIME AS NEEDED FOR RHINITIS Patient taking differently: PLACE 1 SPRAY  INTO BOTH NOSTRILS  EVERY NIGHT AT BEDTIME AS NEEDED FOR RHINITIS 12/27/15  Yes Paz, Jose E, MD  ELIQUIS 5 MG TABS tablet TAKE 1 TABLET(5 MG) BY MOUTH TWICE DAILY Patient taking differently: Take 5 mg by mouth two times a day 08/17/16  Yes Allred, Fayrene FearingJames, MD  fish oil-omega-3 fatty acids 1000 MG capsule Take 2 g by mouth 2 (two) times daily.     Yes [provider]  fluticasone (FLONASE) 50 MCG/ACT nasal spray Place 2 sprays into both nostrils See admin instructions. IN THE MORNING AS NEEDED FOR RHINITIS   Yes [provider]  gabapentin (NEURONTIN) 600 MG tablet Take 600 mg by mouth 2 (two) times daily.   Yes [provider]  glucagon (GLUCAGON EMERGENCY) 1 MG injection Inject 1 mg into the vein once as needed. 02/18/14  Yes Reather LittlerKumar, Ajay, MD  insulin aspart (NOVOLOG FLEXPEN) 100 UNIT/ML FlexPen Inject 5-10 Units into the skin 3 (three) times daily as needed for high blood sugar (IF BGL IS 100 OR GREATER).    Yes [provider]  Insulin Pen Needle (B-D ULTRAFINE III SHORT PEN) 31G X 8 MM MISC Use one per day 02/08/15  Yes Reather LittlerKumar, Ajay, MD  Insulin Syringe-Needle U-100 (INSULIN SYRINGE .5CC/31GX5/16") 31G X 5/16" 0.5 ML MISC USE ONE SYRINGE AS DIRECTED THREE TO FOUR TIMES DAILY 05/04/14  Yes Reather LittlerKumar, Ajay, MD  loratadine (CLARITIN) 10 MG tablet Take 10 mg by mouth daily.    Yes [provider]  ONETOUCH VERIO test strip TEST BLOOD SUGAR FOUR TIMES DAILY 04/26/16  Yes Reather LittlerKumar, Ajay, MD  Polyethyl Glyc-Propyl Glyc PF (SYSTANE ULTRA PF) 0.4-0.3 % SOLN Apply 2 drops to eye 3 (three) times daily as needed (for dry eyes).   Yes [provider]  simvastatin (ZOCOR) 20 MG tablet Take 0.5 tablets (10 mg total) by mouth at bedtime. 05/22/16  Yes Paz, Nolon RodJose E, MD  tamsulosin (FLOMAX) 0.4 MG CAPS capsule Take 1 capsule (0.4 mg total) by mouth daily. 06/20/16  Yes Paz, Nolon RodJose E, MD  XIGDUO XR 10-998 MG TB24 TAKE ONE TABLET BY MOUTH TWICE DAILY -REPLACES  INVOKAMET  XR. 07/26/16  Yes Reather LittlerKumar, Ajay, MD   Continuous Blood Gluc Sensor (FREESTYLE LIBRE SENSOR SYSTEM) MISC 1 strip by Does not apply route once a week. Apply to upper arm and change sensor every 10 days Patient not taking: Reported on 10/26/2016 09/20/16   Reather LittlerKumar, Ajay, MD  insulin degludec (TRESIBA FLEXTOUCH) 100 UNIT/ML SOPN FlexTouch Pen Inject 0.2 mLs (20 Units total) into the skin at bedtime. 11/02/16   Regalado, Prentiss BellsBelkys A, MD    Family History Family History  Problem Relation Age of Onset  . Heart disease Mother        M-- MI,CHF  . Diabetes Mother   . Hypertension Mother   . Hypertension Father   . Cancer Neg Hx  Negative FH of Colon or Prostate Cancer    Social History Social History  Substance Use Topics  . Smoking status: Former Smoker    Types: Cigarettes    Quit date: 06/26/1985  . Smokeless tobacco: Never Used  . Alcohol use No     Comment:       Allergies   Patient has no known allergies.   Review of Systems Review of Systems  Constitutional: Negative for activity change, chills and fever.  HENT: Negative for congestion.   Respiratory: Positive for shortness of breath.   Cardiovascular: Negative for chest pain.  Gastrointestinal: Positive for abdominal pain, diarrhea, nausea and vomiting. Negative for abdominal distention, anal bleeding, blood in stool, constipation and rectal pain.       Blood-streaked emesis  Genitourinary: Negative for discharge, penile pain, penile swelling, scrotal swelling and testicular pain.  Musculoskeletal: Negative for back pain and gait problem.  Skin: Negative for rash.  Allergic/Immunologic: Negative for immunocompromised state.  Neurological: Positive for dizziness and headaches. Negative for tremors, seizures, syncope, facial asymmetry, speech difficulty, weakness, light-headedness and numbness.  Psychiatric/Behavioral: Negative for confusion.   Physical Exam Updated Vital Signs BP 115/63 (BP Location: Right Arm)   Pulse 60   Temp 98.3 F (36.8 C) (Oral)    Resp 18   Ht 5\' 11"  (1.803 m)   Wt 95.8 kg   SpO2 96%   BMI 29.46 kg/m   Physical Exam  Constitutional: He is oriented to person, place, and time. He appears well-developed and well-nourished.  HENT:  Head: Normocephalic and atraumatic.  Eyes: Conjunctivae are normal.  Neck: Neck supple.  Cardiovascular: Normal rate, regular rhythm, normal heart sounds and intact distal pulses.  Exam reveals no gallop and no friction rub.   No murmur heard. Pulmonary/Chest: Effort normal and breath sounds normal. No respiratory distress. He has no wheezes. He has no rales.  Abdominal: Soft. Bowel sounds are normal. He exhibits no distension. There is tenderness. There is no guarding. A hernia (reducible) is present. Hernia confirmed negative in the right inguinal area and confirmed negative in the left inguinal area.  TTP in the RLQ. No other focal tenderness. No CVA tenderness bilaterally. Abdomen is soft, non-distended. Small reducible umbilical hernia present. No signs of strangulation. No aortic bruit present.   Genitourinary: Prostate normal. Rectal exam shows guaiac positive stool. Right testis shows no mass, no swelling and no tenderness. Left testis shows no mass, no swelling and no tenderness. No phimosis, paraphimosis, hypospadias, penile erythema or penile tenderness. No discharge found.  Genitourinary Comments: Chaperoned exam.   Musculoskeletal: Normal range of motion. He exhibits no edema, tenderness or deformity.  Lymphadenopathy: No inguinal adenopathy noted on the right or left side.  Neurological: He is alert and oriented to person, place, and time. He has normal strength. No cranial nerve deficit or sensory deficit. He exhibits normal muscle tone. He displays no seizure activity. Coordination and gait normal. GCS eye subscore is 4. GCS verbal subscore is 5. GCS motor subscore is 6.  Skin: Skin is warm and dry.  Psychiatric: His behavior is normal.  Nursing note and vitals  reviewed.    ED Treatments / Results  Labs (all labs ordered are listed, but only abnormal results are displayed) Labs Reviewed  GASTROINTESTINAL PANEL BY PCR, STOOL (REPLACES STOOL CULTURE) - Abnormal; Notable for the following:       Result Value   Norovirus GI/GII DETECTED (*)    All other components within normal limits  COMPREHENSIVE  METABOLIC PANEL - Abnormal; Notable for the following:    Glucose, Bld 147 (*)    BUN 21 (*)    Total Protein 8.2 (*)    All other components within normal limits  CBC - Abnormal; Notable for the following:    WBC 12.9 (*)    RBC 6.62 (*)    Hemoglobin 18.6 (*)    HCT 53.7 (*)    All other components within normal limits  URINALYSIS, ROUTINE W REFLEX MICROSCOPIC - Abnormal; Notable for the following:    Specific Gravity, Urine >1.046 (*)    Glucose, UA >=500 (*)    All other components within normal limits  URINALYSIS, MICROSCOPIC (REFLEX) - Abnormal; Notable for the following:    Bacteria, UA RARE (*)    Squamous Epithelial / LPF 0-5 (*)    All other components within normal limits  OCCULT BLOOD X 1 CARD TO LAB, STOOL - Abnormal; Notable for the following:    Fecal Occult Bld POSITIVE (*)    All other components within normal limits  GLUCOSE, CAPILLARY - Abnormal; Notable for the following:    Glucose-Capillary 101 (*)    All other components within normal limits  BASIC METABOLIC PANEL - Abnormal; Notable for the following:    Glucose, Bld 133 (*)    Calcium 8.1 (*)    All other components within normal limits  GLUCOSE, CAPILLARY - Abnormal; Notable for the following:    Glucose-Capillary 136 (*)    All other components within normal limits  GLUCOSE, CAPILLARY - Abnormal; Notable for the following:    Glucose-Capillary 101 (*)    All other components within normal limits  GLUCOSE, CAPILLARY - Abnormal; Notable for the following:    Glucose-Capillary 109 (*)    All other components within normal limits  GLUCOSE, CAPILLARY -  Abnormal; Notable for the following:    Glucose-Capillary 111 (*)    All other components within normal limits  GLUCOSE, CAPILLARY - Abnormal; Notable for the following:    Glucose-Capillary 122 (*)    All other components within normal limits  GLUCOSE, CAPILLARY - Abnormal; Notable for the following:    Glucose-Capillary 117 (*)    All other components within normal limits  CBG MONITORING, ED - Abnormal; Notable for the following:    Glucose-Capillary 144 (*)    All other components within normal limits  C DIFFICILE QUICK SCREEN W PCR REFLEX  LIPASE, BLOOD  CBC  CBC    EKG  EKG Interpretation None       Radiology Dg Chest 1 View  Result Date: 11/01/2016 CLINICAL DATA:  Sore throat, cough EXAM: CHEST 1 VIEW COMPARISON:  03/25/2014 FINDINGS: The heart size and mediastinal contours are within normal limits. Both lungs are clear. The visualized skeletal structures are unremarkable. IMPRESSION: No active disease. Electronically Signed   By: Elige Ko   On: 11/01/2016 11:36    Procedures Procedures (including critical care time)  Medications Ordered in ED Medications  ondansetron (ZOFRAN) injection 4 mg (4 mg Intravenous Given 10/31/16 0933)  iopamidol (ISOVUE-300) 61 % injection 100 mL (100 mLs Intravenous Contrast Given 10/31/16 1057)  acetaminophen (TYLENOL) tablet 650 mg (650 mg Oral Given 10/31/16 1141)  sodium chloride 0.9 % bolus 1,000 mL (0 mLs Intravenous Stopped 10/31/16 1200)  sodium chloride 0.9 % bolus 1,000 mL (0 mLs Intravenous Stopped 10/31/16 1533)  ciprofloxacin (CIPRO) IVPB 400 mg (0 mg Intravenous Stopped 10/31/16 1747)  metroNIDAZOLE (FLAGYL) IVPB 500 mg (0 mg Intravenous Stopped  10/31/16 1747)  pantoprazole (PROTONIX) 80 mg in sodium chloride 0.9 % 100 mL IVPB (0 mg Intravenous Stopped 10/31/16 1602)  pantoprazole (PROTONIX) 40 MG injection (80 mg  Given 10/31/16 1533)   Initial Impression / Assessment and Plan / ED Course  I have reviewed the triage vital signs and  the nursing notes.  Pertinent labs & imaging results that were available during my care of the patient were reviewed by me and considered in my medical decision making (see chart for details).     56 y.o. male with acute onset abdominal pain, N/V/D who was sent to the ED from his PCP's office after reporting an episode of blood-streaked emesis this AM. Initially afebrile in the ED. Tmax 101.3 despite administration of Tylenol. Non-tachy, normotensive. CT A/P negative for infection, source of pain. Hemeoccult positive. Leukocytosis and polycythemia, likely hemo-dilutional. Discussed the patient with Dr. Silverio Lay, attending physician. C. Diff panel ordered, pending as it is a send out. Will admit to [facility] for continued work up with [facility].   Final Clinical Impressions(s) / ED Diagnoses   Final diagnoses:  Abdominal pain, vomiting, and diarrhea    New Prescriptions Discharge Medication List as of 11/02/2016 12:08 PM       Barkley Boards, PA-C 11/03/16 0004    Charlynne Pander, MD 11/04/16 [phone]

## 2016-10-31 NOTE — ED Notes (Signed)
Patient's wife, Fannie KneeSue, is made aware that [facility] Room [phone].  She verbalized understanding.  Attempted to call for report for floor nurse.  Will try to call again.

## 2016-10-31 NOTE — ED Notes (Signed)
Attempted to call for report.  [facility] just arrived.

## 2016-10-31 NOTE — Progress Notes (Signed)
Called by Dr. Silverio LayYao regarding [name], 56 year old male with history of diabetes, presenting to the emergency room with abdominal pain, vomiting as well as diarrhea. He also has noticed that he's been having some streaks of blood in emesis, as well as has been having some dark stools. He was guaiac positive in the ED. He also was febrile and concern for infectious etiology, C. difficile was sent. CT scan was negative. Gastroenterology was consulted, and we were asked to admit.  RN, on patient arrival, please call [phone] and let patient placement RN know of the patient's arrival and a hospitalist will be assigned to admit the patient.   Sundance Moise M. Elvera LennoxGherghe, MD [facility] [phone]  If 7PM-7AM, please contact night-coverage [email or web address] Password [email or web address]  08/04/2016, 5:37 AM

## 2016-10-31 NOTE — ED Triage Notes (Signed)
Patient states that he woke up at 400 am with right lower quad pain. The patient has now started to have N/V

## 2016-11-01 ENCOUNTER — Observation Stay (HOSPITAL_COMMUNITY): Payer: BC Managed Care – PPO

## 2016-11-01 DIAGNOSIS — R109 Unspecified abdominal pain: Secondary | ICD-10-CM | POA: Diagnosis not present

## 2016-11-01 DIAGNOSIS — R112 Nausea with vomiting, unspecified: Secondary | ICD-10-CM | POA: Diagnosis not present

## 2016-11-01 DIAGNOSIS — R197 Diarrhea, unspecified: Secondary | ICD-10-CM

## 2016-11-01 DIAGNOSIS — A0811 Acute gastroenteropathy due to Norwalk agent: Secondary | ICD-10-CM

## 2016-11-01 DIAGNOSIS — I10 Essential (primary) hypertension: Secondary | ICD-10-CM | POA: Diagnosis not present

## 2016-11-01 DIAGNOSIS — R1110 Vomiting, unspecified: Secondary | ICD-10-CM

## 2016-11-01 DIAGNOSIS — K922 Gastrointestinal hemorrhage, unspecified: Secondary | ICD-10-CM | POA: Diagnosis not present

## 2016-11-01 LAB — GASTROINTESTINAL PANEL BY PCR, STOOL (REPLACES STOOL CULTURE)

## 2016-11-01 LAB — CBC
HEMATOCRIT: 42.6 % (ref 39.0–52.0)
HEMOGLOBIN: 14.3 g/dL (ref 13.0–17.0)
MCH: 27.9 pg (ref 26.0–34.0)
MCHC: 33.6 g/dL (ref 30.0–36.0)
MCV: 83.2 fL (ref 78.0–100.0)
PLATELETS: 174 10*3/uL (ref 150–400)
RBC: 5.12 MIL/uL (ref 4.22–5.81)
RDW: 13.9 % (ref 11.5–15.5)
WBC: 4.7 10*3/uL (ref 4.0–10.5)

## 2016-11-01 LAB — GLUCOSE, CAPILLARY
Glucose-Capillary: 101 mg/dL — ABNORMAL HIGH (ref 65–99)
Glucose-Capillary: 101 mg/dL — ABNORMAL HIGH (ref 65–99)
Glucose-Capillary: 136 mg/dL — ABNORMAL HIGH (ref 65–99)

## 2016-11-01 MED ORDER — CIPROFLOXACIN IN D5W 400 MG/200ML IV SOLN
400.0000 mg | INTRAVENOUS | Status: DC
  Administered 2016-11-01: 400 mg via INTRAVENOUS
  Filled 2016-11-01: qty 200

## 2016-11-01 MED ORDER — APIXABAN 5 MG PO TABS
5.0000 mg | ORAL_TABLET | Freq: Two times a day (BID) | ORAL | Status: DC
  Administered 2016-11-01 – 2016-11-02 (×2): 5 mg via ORAL
  Filled 2016-11-01 (×2): qty 1

## 2016-11-01 MED ORDER — PANTOPRAZOLE SODIUM 40 MG PO TBEC
40.0000 mg | DELAYED_RELEASE_TABLET | Freq: Two times a day (BID) | ORAL | Status: DC
  Administered 2016-11-01 – 2016-11-02 (×3): 40 mg via ORAL
  Filled 2016-11-01 (×3): qty 1

## 2016-11-01 MED ORDER — DICYCLOMINE HCL 10 MG PO CAPS
10.0000 mg | ORAL_CAPSULE | Freq: Three times a day (TID) | ORAL | Status: DC
  Administered 2016-11-01 – 2016-11-02 (×4): 10 mg via ORAL
  Filled 2016-11-01 (×4): qty 1

## 2016-11-01 MED ORDER — METRONIDAZOLE IN NACL 5-0.79 MG/ML-% IV SOLN
500.0000 mg | Freq: Three times a day (TID) | INTRAVENOUS | Status: DC
  Administered 2016-11-01: 500 mg via INTRAVENOUS
  Filled 2016-11-01: qty 100

## 2016-11-01 NOTE — Progress Notes (Signed)
PROGRESS NOTE    Danny GreenhouseFred W Goedde  ZOX:[id number] DOB: 1961-05-31 DOA: 10/31/2016 PCP: Wanda PlumpPaz, Jose E, MD    Brief Narrative: Danny [name] is a 56 y.o. male with medical history significant of diabetes mellitus. Symptoms started with episodes of emesis since 4:30AM. He reports 5-6 episodes. Watery stools started around 8:45AM when [facility]. He reports about 7-8 watery stools since then. He started taking water to rehydrate himself.   ED Course: Vitals: Fever of 101.3, normal pulse, normal respirations, soft blood pressures in the 100-110s Labs: Glucose of 147. WBC 12.9. Hemoglobin 18.6. Fecal occult positive Imaging: CT abdomen/pelvis unremarkable. Medications/Course: Ciprofloxacin and Flagyl, Protonix, 2L NS boluses, zofran  Assessment & Plan:   Principal Problem:   GI bleed Active Problems:   Hypertension   PAF (paroxysmal atrial fibrillation) (HCC)   DM type 2 with diabetic peripheral neuropathy (HCC)   Nausea, vomiting and diarrhea  1-Gastroenteritis; Norovirus.  Presents with nausea, vomiting and diarrhea.  C diff negative.  Received IV cipro and flagyl. Discontinue due to positive norovirus.  Clear diet today.  IV fluids.  GI was consulted, no plan for endoscopy.  CT abdomen negative.   Fever, leukocytosis;  Related to Gastroenteritis.  Chest x ray negative,.   DM, with peripheral neuropathy;  On lantus, SSI.  Continue with gabapentin.   PAF;  S/p ablation. Sinus on exam. No EKG available Resume Eliquis when OK by GI. Concern on admission with GI bleed.   Hypercholesterolemia -Continue with statin.   BPH -on flomax   DVT prophylaxis: scd.  Code Status: full code.  Family Communication: wife who was at bedside.  Disposition Plan: home in 24 hours if tolerates diet.    Consultants:   GI   Procedures:   none   Antimicrobials:  Received one dose of cipro and flagyl.    Subjective: He is feeling better, no further vomiting since  yesterday.  He had multiples episode of nausea, vomiting and diarrhea. No sick contact.   Objective: Vitals:   10/31/16 1610 10/31/16 1726 10/31/16 2050 11/01/16 0555  BP:  116/64 137/65 (!) 111/57  Pulse:  64 63 (!) 59  Resp:  18 18 18   Temp: 98 F (36.7 C) 98.7 F (37.1 C) 99.6 F (37.6 C) 98.5 F (36.9 C)  TempSrc: Oral Oral Oral Oral  SpO2:  98% 96% 97%  Weight:  95.8 kg (211 lb 3.2 oz)    Height:  5\' 11"  (1.803 m)      Intake/Output Summary (Last 24 hours) at 11/01/16 1337 Last data filed at 11/01/16 1329  Gross per 24 hour  Intake          2338.33 ml  Output                0 ml  Net          2338.33 ml   Filed Weights   10/31/16 0925 10/31/16 1726  Weight: 94.3 kg (208 lb) 95.8 kg (211 lb 3.2 oz)    Examination:  General exam: Appears calm and comfortable  Respiratory system: Clear to auscultation. Respiratory effort normal. Cardiovascular system: S1 & S2 heard, RRR. No JVD, murmurs, rubs, gallops or clicks. No pedal edema. Gastrointestinal system: Abdomen is nondistended, soft and nontender. No organomegaly or masses felt. Normal bowel sounds heard. Central nervous system: Alert and oriented. No focal neurological deficits. Extremities: Symmetric 5 x 5 power. Skin: No rashes, lesions or ulcers Psychiatry: Judgement and insight appear normal. Mood &  affect appropriate.     Data Reviewed: I have personally reviewed following labs and imaging studies  CBC:  Recent Labs Lab 10/25/16 1340 10/31/16 0920 11/01/16 0910  WBC  --  12.9* 4.7  HGB 16.7 18.6* 14.3  HCT 49.0 53.7* 42.6  MCV  --  81.1 83.2  PLT  --  255 174   Basic Metabolic Panel:  Recent Labs Lab 10/25/16 1340 10/31/16 0920  NA 140 140  K 3.7 4.2  CL 101 103  CO2  --  26  GLUCOSE 147* 147*  BUN 17 21*  CREATININE 0.80 0.84  CALCIUM  --  9.2   GFR: Estimated Creatinine Clearance: 114.6 mL/min (by C-G formula based on SCr of 0.84 mg/dL). Liver Function Tests:  Recent Labs Lab  10/31/16 0920  AST 32  ALT 43  ALKPHOS 72  BILITOT 0.7  PROT 8.2*  ALBUMIN 4.5    Recent Labs Lab 10/31/16 0920  LIPASE 18   No results for input(s): AMMONIA in the last 168 hours. Coagulation Profile: No results for input(s): INR, PROTIME in the last 168 hours. Cardiac Enzymes: No results for input(s): CKTOTAL, CKMB, CKMBINDEX, TROPONINI in the last 168 hours. BNP (last 3 results) No results for input(s): PROBNP in the last 8760 hours. HbA1C: No results for input(s): HGBA1C in the last 72 hours. CBG:  Recent Labs Lab 10/31/16 0922  GLUCAP 144*   Lipid Profile: No results for input(s): CHOL, HDL, LDLCALC, TRIG, CHOLHDL, LDLDIRECT in the last 72 hours. Thyroid Function Tests: No results for input(s): TSH, T4TOTAL, FREET4, T3FREE, THYROIDAB in the last 72 hours. Anemia Panel: No results for input(s): VITAMINB12, FOLATE, FERRITIN, TIBC, IRON, RETICCTPCT in the last 72 hours. Sepsis Labs: No results for input(s): PROCALCITON, LATICACIDVEN in the last 168 hours.  Recent Results (from the past 240 hour(s))  C difficile quick scan w PCR reflex     Status: None   Collection Time: 10/31/16  1:45 PM  Result Value Ref Range Status   C Diff antigen NEGATIVE NEGATIVE Final   C Diff toxin NEGATIVE NEGATIVE Final   C Diff interpretation No C. difficile detected.  Final    Comment: Performed at [facility], [address], [place], [place] [address]  Gastrointestinal Panel by PCR , Stool     Status: Abnormal   Collection Time: 10/31/16  9:10 PM  Result Value Ref Range Status   Campylobacter species NOT DETECTED NOT DETECTED Final   Plesimonas shigelloides NOT DETECTED NOT DETECTED Final   Salmonella species NOT DETECTED NOT DETECTED Final   Yersinia enterocolitica NOT DETECTED NOT DETECTED Final   Vibrio species NOT DETECTED NOT DETECTED Final   Vibrio cholerae NOT DETECTED NOT DETECTED Final   Enteroaggregative E coli (EAEC) NOT DETECTED NOT DETECTED Final    Enteropathogenic E coli (EPEC) NOT DETECTED NOT DETECTED Final   Enterotoxigenic E coli (ETEC) NOT DETECTED NOT DETECTED Final   Shiga like toxin producing E coli (STEC) NOT DETECTED NOT DETECTED Final   Shigella/Enteroinvasive E coli (EIEC) NOT DETECTED NOT DETECTED Final   Cryptosporidium NOT DETECTED NOT DETECTED Final   Cyclospora cayetanensis NOT DETECTED NOT DETECTED Final   Entamoeba histolytica NOT DETECTED NOT DETECTED Final   Giardia lamblia NOT DETECTED NOT DETECTED Final   Adenovirus F40/41 NOT DETECTED NOT DETECTED Final   Astrovirus NOT DETECTED NOT DETECTED Final   Norovirus GI/GII DETECTED (A) NOT DETECTED Final    Comment: RESULT CALLED TO, READ BACK BY AND VERIFIED WITH:  MARIE KELLY 11/01/16 1227 SGD RESULT CALLED TO, READ BACK BY AND VERIFIED WITH: Orvilla Fus, RN @1243  11/01/16 MKELLY,MLT    Rotavirus A NOT DETECTED NOT DETECTED Final   Sapovirus (I, II, IV, and V) NOT DETECTED NOT DETECTED Final         Radiology Studies: Dg Chest 1 View  Result Date: 11/01/2016 CLINICAL DATA:  Sore throat, cough EXAM: CHEST 1 VIEW COMPARISON:  03/25/2014 FINDINGS: The heart size and mediastinal contours are within normal limits. Both lungs are clear. The visualized skeletal structures are unremarkable. IMPRESSION: No active disease. Electronically Signed   By: Elige Ko   On: 11/01/2016 11:36   Ct Abdomen Pelvis W Contrast  Result Date: 10/31/2016 CLINICAL DATA:  Diffuse/ right lower quadrant abdominal pain EXAM: CT ABDOMEN AND PELVIS WITH CONTRAST TECHNIQUE: Multidetector CT imaging of the abdomen and pelvis was performed using the standard protocol following bolus administration of intravenous contrast. CONTRAST:  ISOVUE-300 IOPAMIDOL (ISOVUE-300) INJECTION 61% COMPARISON:  08/25/2016 FINDINGS: Lower chest: Lungs are essentially clear. Hepatobiliary: Liver is within normal limits. Gallbladder is within normal limits. No intrahepatic or extrahepatic ductal dilatation. Pancreas:  Within normal limits. Spleen: Within normal limits. Adrenals/Urinary Tract: Adrenal glands are within normal limits. Kidneys are within normal limits.  No hydronephrosis. Bladder is within normal limits. Stomach/Bowel: Stomach is within normal limits. No evidence of bowel obstruction. Normal appendix (series 2/ image 58). Vascular/Lymphatic: Atherosclerotic calcifications of the aortic arch. No suspicious abdominopelvic lymphadenopathy. 8 mm aortocaval node (series 2/ image 49). Reproductive: Prostate is unremarkable. Other: No abdominopelvic ascites. Musculoskeletal: Degenerative changes of the visualized thoracolumbar spine. IMPRESSION: No evidence bowel obstruction.  Normal appendix. No CT findings to account for the patient's abdominal pain. Electronically Signed   By: Charline Bills M.D.   On: 10/31/2016 11:29        Scheduled Meds: . dicyclomine  10 mg Oral TID AC  . gabapentin  600 mg Oral BID  . insulin aspart  0-15 Units Subcutaneous Q4H  . insulin glargine  20 Units Subcutaneous QHS  . loratadine  10 mg Oral Daily  . pantoprazole  40 mg Oral BID  . simvastatin  10 mg Oral QHS  . tamsulosin  0.4 mg Oral Daily   Continuous Infusions: . sodium chloride 100 mL/hr at 10/31/16 2049     LOS: 0 days    Time spent: 35 minutes.     Alba Cory, MD [facility] Pager [phone]  If 7PM-7AM, please contact night-coverage [email or web address] Password [email or web address] 11/01/2016, 1:37 PM

## 2016-11-01 NOTE — Consult Note (Signed)
Referring Provider: [facility]   Primary Care Physician:  Wanda PlumpPaz, Jose E, MD Primary Gastroenterologist:   Dr. Chales AbrahamsGupta  Reason for Consultation:  Nausea, vomiting, diarrhea  ASSESSMENT AND PLAN:    1. 56 yo male admitted with acute nausea, vomiting, diffuse abdominal pain and fevers. Based on initial CBC he probably has associated dehydration. CTscan with contrast was unremarkable. Suspect gastroenteritis. His symptoms have resolved. No fevers since yesterday around 1pm. WBC and HCT normalized after IV fluids. -Stool studies pending  -continue cipro and flagyl for now.  -trial of clears   2. AFIB, on Eliquis at home  3. DM, controlled.    HPI: Danny [name] is a 56 y.o. male admitted yesterday evening with fever, vomiting and diarrhea. Vomiting and diffuse abdominal pain started at 4:30am yesterday morning. After several episodes of non-bloody emesis patient came to ED where he then began having non-bloody. Temp was 101 in ED at ~ 1pm. He didn't eat anything unusual day before symptoms started. His wife isn't ill. No known sick contacts. Initially In ED his WBC was 12.9 and hgb 18.6. CT scan with contrast was unremarkable. He hasn't had any nausea, vomiting or diarrhea since around 8pm last night. His stools was dark brown, heme positive.   No chronic GI complaints. Colonoscopy for rectal bleeding in June 2014 by Dr[facility]. Prep was fair. Findings included diverticulosis and hemorrhoids.    Past Medical History:  Diagnosis Date  . Allergic rhinitis   . Benign prostatic hypertrophy    s/p TUNA procedure aprx 2006,  . Carpal tunnel syndrome 06/17/2009  . Diabetes mellitus    Type II dx 1999  . 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 ED (erectile dysfunction)    After TUNA  . Elevated LFTs 2010   (-) chronic hep  panel  . History of cardiovascular stress test 8/11   Negative  . Hyperlipidemia   . HYPERSOMNIA UNSPECIFIED  12/11/2007  . Hypertension   . INSOMNIA-SLEEP DISORDER-UNSPEC 04/12/2010  . Obesity   . PAF (paroxysmal atrial fibrillation) (HCC) 12/21/2009   a. Failed Multaq. b. Prev on flecainide. c. s/p afib ablation 2015.    Past Surgical History:  Procedure Laterality Date  . ABLATION  03-24-14   PVI by Dr Johney FrameAllred  . ATRIAL FIBRILLATION ABLATION N/A 03/24/2014   Procedure: ATRIAL FIBRILLATION ABLATION;  Surgeon: Gardiner RhymeJames D Allred, MD;  Location: [facility] CATH LAB;  Service: Cardiovascular;  Laterality: N/A;  . CYSTOSCOPY  11-09   neg  @ urology, was rx Flomax  . PROSTATE SURGERY     TUNA  . TEE WITHOUT CARDIOVERSION N/A 03/23/2014   Procedure: TRANSESOPHAGEAL ECHOCARDIOGRAM (TEE);  Surgeon: Pricilla RifflePaula Ross V, MD;  [facility];  Service: Cardiovascular;  Laterality: N/A;  . VASECTOMY     several years ago    Prior to Admission medications   Medication Sig Start Date End Date Taking? Authorizing Provider  acetaminophen (TYLENOL) 650 MG CR tablet Take 650 mg by mouth every 8 (eight) hours as needed for pain.   Yes [provider]  azelastine (ASTELIN) 0.1 % nasal spray PLACE 2 SPRAYS INTO BOTH NOSTRILS EVERY NIGHT AT BEDTIME AS NEEDED FOR RHINITIS Patient taking differently: PLACE 1 SPRAY  INTO BOTH NOSTRILS EVERY NIGHT AT BEDTIME AS NEEDED FOR RHINITIS 12/27/15  Yes Paz, Jose E, MD  ELIQUIS 5 MG TABS tablet TAKE 1 TABLET(5 MG) BY MOUTH TWICE DAILY Patient taking differently: Take 5 mg by  mouth two times a day 08/17/16  Yes Allred, Fayrene Fearing, MD  fish oil-omega-3 fatty acids 1000 MG capsule Take 2 g by mouth 2 (two) times daily.     Yes [provider]  fluticasone (FLONASE) 50 MCG/ACT nasal spray Place 2 sprays into both nostrils See admin instructions. IN THE MORNING AS NEEDED FOR RHINITIS   Yes [provider]  gabapentin (NEURONTIN) 600 MG tablet Take 600 mg by mouth 2 (two) times daily.   Yes [provider]  glucagon (GLUCAGON EMERGENCY) 1 MG injection Inject 1 mg  into the vein once as needed. 02/18/14  Yes Reather Littler, MD  insulin aspart (NOVOLOG FLEXPEN) 100 UNIT/ML FlexPen Inject 5-10 Units into the skin 3 (three) times daily as needed for high blood sugar (IF BGL IS 100 OR GREATER).    Yes [provider]  insulin degludec (TRESIBA FLEXTOUCH) 100 UNIT/ML SOPN FlexTouch Pen Inject 25 Units into the skin at bedtime.    Yes [provider]  Insulin Pen Needle (B-D ULTRAFINE III SHORT PEN) 31G X 8 MM MISC Use one per day 02/08/15  Yes Reather Littler, MD  Insulin Syringe-Needle U-100 (INSULIN SYRINGE .5CC/31GX5/16") 31G X 5/16" 0.5 ML MISC USE ONE SYRINGE AS DIRECTED THREE TO FOUR TIMES DAILY 05/04/14  Yes Reather Littler, MD  loratadine (CLARITIN) 10 MG tablet Take 10 mg by mouth daily.    Yes [provider]  ONETOUCH VERIO test strip TEST BLOOD SUGAR FOUR TIMES DAILY 04/26/16  Yes Reather Littler, MD  Polyethyl Glyc-Propyl Glyc PF (SYSTANE ULTRA PF) 0.4-0.3 % SOLN Apply 2 drops to eye 3 (three) times daily as needed (for dry eyes).   Yes [provider]  simvastatin (ZOCOR) 20 MG tablet Take 0.5 tablets (10 mg total) by mouth at bedtime. 05/22/16  Yes Paz, Nolon Rod, MD  tamsulosin (FLOMAX) 0.4 MG CAPS capsule Take 1 capsule (0.4 mg total) by mouth daily. 06/20/16  Yes Paz, Nolon Rod, MD  XIGDUO XR 10-998 MG TB24 TAKE ONE TABLET BY MOUTH TWICE DAILY -REPLACES  INVOKAMET  XR. 07/26/16  Yes Reather Littler, MD  Continuous Blood Gluc Sensor (FREESTYLE LIBRE SENSOR SYSTEM) MISC 1 strip by Does not apply route once a week. Apply to upper arm and change sensor every 10 days Patient not taking: Reported on 10/26/2016 09/20/16   Reather Littler, MD    Current Facility-Administered Medications  Medication Dose Route Frequency Provider Last Rate Last Dose  . 0.9 %  sodium chloride infusion   Intravenous Continuous Narda Bonds, MD 100 mL/hr at 10/31/16 2049    . acetaminophen (TYLENOL) tablet 650 mg  650 mg Oral Q4H PRN Schorr, Roma Kayser, NP   650 mg at  11/01/16 0942  . gabapentin (NEURONTIN) capsule 600 mg  600 mg Oral BID Narda Bonds, MD   600 mg at 11/01/16 4098  . insulin aspart (novoLOG) injection 0-15 Units  0-15 Units Subcutaneous Q4H Schorr, Roma Kayser, NP   3 Units at 10/31/16 2102  . insulin glargine (LANTUS) injection 20 Units  20 Units Subcutaneous QHS Narda Bonds, MD   20 Units at 10/31/16 2102  . loratadine (CLARITIN) tablet 10 mg  10 mg Oral Daily Narda Bonds, MD   10 mg at 11/01/16 0939  . ondansetron (ZOFRAN-ODT) disintegrating tablet 4 mg  4 mg Oral Q8H PRN Narda Bonds, MD       Or  . ondansetron (ZOFRAN) injection 4 mg  4 mg Intravenous Q8H PRN Nettey,  Howell Pringle, MD      . simvastatin (ZOCOR) tablet 10 mg  10 mg Oral QHS Narda Bonds, MD   10 mg at 10/31/16 2102  . tamsulosin (FLOMAX) capsule 0.4 mg  0.4 mg Oral Daily Narda Bonds, MD   0.4 mg at 11/01/16 2130    Allergies as of 10/31/2016  . (No Known Allergies)    Family History  Problem Relation Age of Onset  . Heart disease Mother     M-- MI,CHF  . Diabetes Mother   . Hypertension Mother   . Hypertension Father   . Cancer Neg Hx     Negative FH of Colon or Prostate Cancer    Social History   Social History  . Marital status: Married    Spouse name: N/A  . Number of children: 2  . Years of education: N/A   Occupational History  . Works for [employer] (state[employer]  Drives a Scientist, product/process development [employer]    works for [employer] (state)   Social History Main Topics  . Smoking status: Former Smoker    Types: Cigarettes    Quit date: 06/26/1985  . Smokeless tobacco: Never Used  . Alcohol use No     Comment:    . Drug use: No  . Sexual activity: Not on file   Other Topics Concern  . Not on file   Social History Narrative   has a girlfriend, lives in [place]             Review of Systems: All systems reviewed and negative except where noted in HPI.  Physical Exam: Vital signs in last 24 hours: Temp:  [98 F (36.7 C)-101.3 F (38.5 C)] 98.5  F (36.9 C) (05/09 0555) Pulse Rate:  [59-77] 59 (05/09 0555) Resp:  [12-20] 18 (05/09 0555) BP: (102-146)/(52-65) 111/57 (05/09 0555) SpO2:  [94 %-100 %] 97 % (05/09 0555) Weight:  [211 lb 3.2 oz (95.8 kg)] 211 lb 3.2 oz (95.8 kg) (05/08 1726) Last BM Date: 10/31/16 General:   Alert, well-developed white male in NAD Eyes:  Sclera clear, no icterus.   Conjunctiva pink. Ears:  Normal auditory acuity. Nose:  No deformity, discharge,  or lesions.  Neck:  Supple; no masses Lungs:  Clear throughout to auscultation.   No wheezes, crackles, or rhonchi.  Heart:  Regular rate and rhythm; no murmurs, clicks, rubs,  or gallops. Abdomen:  Soft,nontender, BS active,nonpalp mass or hsm.   Rectal:  Deferred  Msk:  Symmetrical without gross deformities. . Extremities:  Without clubbing or edema. Neurologic:  Alert and  oriented x4;  grossly normal neurologically. Skin:  Intact without significant lesions or rashes.. Psych:  Alert and cooperative. Normal mood and affect.  Intake/Output from previous day: 05/08 0701 - 05/09 0700 In: 3218.3 [P.O.:200; I.V.:618.3; IV Piggyback:2400] Out: -  Intake/Output this shift: No intake/output data recorded.  Lab Results:  Recent Labs  10/31/16 0920 11/01/16 0910  WBC 12.9* 4.7  HGB 18.6* 14.3  HCT 53.7* 42.6  PLT 255 174   BMET  Recent Labs  10/31/16 0920  NA 140  K 4.2  CL 103  CO2 26  GLUCOSE 147*  BUN 21*  CREATININE 0.84  CALCIUM 9.2   LFT  Recent Labs  10/31/16 0920  PROT 8.2*  ALBUMIN 4.5  AST 32  ALT 43  ALKPHOS 72  BILITOT 0.7   PT/INR No results for input(s): LABPROT, INR in the last 72 hours. Hepatitis Panel No results for input(s):  HEPBSAG, HCVAB, HEPAIGM, HEPBIGM in the last 72 hours.    Studies/Results: Ct Abdomen Pelvis W Contrast  Result Date: 10/31/2016 CLINICAL DATA:  Diffuse/ right lower quadrant abdominal pain EXAM: CT ABDOMEN AND PELVIS WITH CONTRAST TECHNIQUE: Multidetector CT imaging of the abdomen  and pelvis was performed using the standard protocol following bolus administration of intravenous contrast. CONTRAST:  ISOVUE-300 IOPAMIDOL (ISOVUE-300) INJECTION 61% COMPARISON:  08/25/2016 FINDINGS: Lower chest: Lungs are essentially clear. Hepatobiliary: Liver is within normal limits. Gallbladder is within normal limits. No intrahepatic or extrahepatic ductal dilatation. Pancreas: Within normal limits. Spleen: Within normal limits. Adrenals/Urinary Tract: Adrenal glands are within normal limits. Kidneys are within normal limits.  No hydronephrosis. Bladder is within normal limits. Stomach/Bowel: Stomach is within normal limits. No evidence of bowel obstruction. Normal appendix (series 2/ image 58). Vascular/Lymphatic: Atherosclerotic calcifications of the aortic arch. No suspicious abdominopelvic lymphadenopathy. 8 mm aortocaval node (series 2/ image 49). Reproductive: Prostate is unremarkable. Other: No abdominopelvic ascites. Musculoskeletal: Degenerative changes of the visualized thoracolumbar spine. IMPRESSION: No evidence bowel obstruction.  Normal appendix. No CT findings to account for the patient's abdominal pain. Electronically Signed   By: Charline Bills M.D.   On: 10/31/2016 11:29    Willette Cluster, NP-C @  11/01/2016, 9:45 AM Pager number [phone]  GI ATTENDING  History, laboratories, relevant data reviewed. Patient personally seen and examined. Daughter in room. Agree with comprehensive consultation note as outlined above. Clinical presentation most consistent with acute viral gastroenteritis versus food poisoning. Patient feeling better. No further vomiting or diarrhea. GI pathogen panel shows evidence of NOROVIRUS. This would be consistent with his clinical presentation. Recommend vigorous hydration as you have done. Advance diet as tolerated. If doing well, anticipate discharge in a.m. No GI follow-up needed. Antibiotics discontinued. Will sign off. Thank you  Wilhemina Bonito. Eda Keys., M.D. [facility] Division of Gastroenterology

## 2016-11-01 NOTE — Care Management Note (Signed)
Case Management Note  Patient Details  Name: Danny [name] MRN: [id number] Date of Birth: July 24, 1960  Subjective/Objective:          Gi bleed, n,v,d x several days          Action/Plan:Date:  Nov 01, 2016 Chart reviewed for concurrent status and case management needs. Will continue to follow patient progress. Discharge Planning: following for needs Expected discharge date: [id number] Marcelle SmilingRhonda Davis, BSN, [place]3, [place]   [phone] Expected Discharge Date:   (unknown)               Expected Discharge Plan:  Home/Self Care  In-House Referral:     Discharge planning Services  CM Consult  Post Acute Care Choice:    Choice offered to:     DME Arranged:    DME Agency:     HH Arranged:    HH Agency:     Status of Service:  In process, will continue to follow  If discussed at Long Length of Stay Meetings, dates discussed:    Additional Comments:  Golda AcreDavis, Rhonda Lynn, RN 11/01/2016, 8:18 AM

## 2016-11-01 NOTE — Progress Notes (Signed)
CRITICAL VALUE ALERT  Critical value received:  Positive for Norovirus   Date of notification:  11/01/2016  Time of notification:  12:52  Critical value read back:Yes.    Nurse who received alert:  Norma FredricksonBableen  MD notified (1st page):  [place]  Time of first page:  12:52  MD notified (2nd page):  Time of second page:  Responding MD:    Time MD responded:

## 2016-11-01 NOTE — Progress Notes (Signed)
Per Diabetic Coordinator, Ulyses AmorJenny S., CBG would need to be checked via cone glucometer.

## 2016-11-02 ENCOUNTER — Telehealth: Payer: Self-pay | Admitting: Internal Medicine

## 2016-11-02 DIAGNOSIS — R1110 Vomiting, unspecified: Secondary | ICD-10-CM | POA: Diagnosis not present

## 2016-11-02 DIAGNOSIS — R197 Diarrhea, unspecified: Secondary | ICD-10-CM | POA: Diagnosis not present

## 2016-11-02 DIAGNOSIS — R109 Unspecified abdominal pain: Secondary | ICD-10-CM | POA: Diagnosis not present

## 2016-11-02 DIAGNOSIS — K922 Gastrointestinal hemorrhage, unspecified: Secondary | ICD-10-CM | POA: Diagnosis not present

## 2016-11-02 LAB — BASIC METABOLIC PANEL
ANION GAP: 5 (ref 5–15)
BUN: 13 mg/dL (ref 6–20)
CALCIUM: 8.1 mg/dL — AB (ref 8.9–10.3)
CO2: 25 mmol/L (ref 22–32)
Chloride: 111 mmol/L (ref 101–111)
Creatinine, Ser: 0.85 mg/dL (ref 0.61–1.24)
GFR calc Af Amer: >60 mL/min (ref >60)
GLUCOSE: 133 mg/dL — AB (ref 65–99)
POTASSIUM: 3.9 mmol/L (ref 3.5–5.1)
SODIUM: 141 mmol/L (ref 135–145)

## 2016-11-02 LAB — GLUCOSE, CAPILLARY
GLUCOSE-CAPILLARY: 109 mg/dL — AB (ref 65–99)
GLUCOSE-CAPILLARY: 122 mg/dL — AB (ref 65–99)
Glucose-Capillary: 111 mg/dL — ABNORMAL HIGH (ref 65–99)
Glucose-Capillary: 117 mg/dL — ABNORMAL HIGH (ref 65–99)

## 2016-11-02 LAB — CBC
HCT: 43.8 % (ref 39.0–52.0)
Hemoglobin: 14.4 g/dL (ref 13.0–17.0)
MCH: 27.6 pg (ref 26.0–34.0)
MCHC: 32.9 g/dL (ref 30.0–36.0)
MCV: 83.9 fL (ref 78.0–100.0)
PLATELETS: 178 10*3/uL (ref 150–400)
RBC: 5.22 MIL/uL (ref 4.22–5.81)
RDW: 13.9 % (ref 11.5–15.5)
WBC: 4.4 10*3/uL (ref 4.0–10.5)

## 2016-11-02 MED ORDER — INSULIN DEGLUDEC 100 UNIT/ML ~~LOC~~ SOPN: 20.0000 [IU] | PEN_INJECTOR | Freq: Every day | SUBCUTANEOUS | 0 refills | Status: DC

## 2016-11-02 NOTE — Progress Notes (Signed)
Patient given discharge instructions, and verbalized an understanding of all discharge instructions.  Patient agrees with discharge plan, and is being discharged in stable medical condition.  Patient given transportation via wheelchair. 

## 2016-11-02 NOTE — Telephone Encounter (Signed)
Please advise 

## 2016-11-02 NOTE — Telephone Encounter (Signed)
Relation to ZO:XWRUpt:self Call back [phone]   Reason for call:  Patient was last seen by Dr. Drue NovelPaz 10/31/16 for abdominal pain patient requesting Dr. Note excusing him from work from 10/31/16 thru 11/06/16 and [facility],please advise

## 2016-11-02 NOTE — Progress Notes (Signed)
Date: Nov 01, 2016 Discharge orders review for case management needs.  None found Rhonda Davis, BSN, RN3, CCM: [phone] 

## 2016-11-02 NOTE — Discharge Summary (Signed)
Physician Discharge Summary  Danny [name]:[id number] DOB: May 14, 1961 DOA: 10/31/2016  PCP: Wanda Plump, MD  Admit date: 10/31/2016 Discharge date: 11/02/2016  Admitted From: Home  Disposition: home   Recommendations for Outpatient Follow-up:  1. Follow up with PCP in 1-2 weeks 2. Please obtain BMP/CBC in one week    Discharge Condition: stable.  CODE STATUS: full code.  Diet recommendation: Heart Healthy   Brief/Interim Summary: Danny [name] a 56 y.o.malewith medical history significant of diabetes mellitus. Symptoms started with episodes of emesis since 4:30AM. He reports 5-6 episodes. Watery stools started around 8:45AM when he got to [facility]. He reports about 7-8 watery stools since then. He started taking water to rehydrate himself.   ED Course: Vitals: Fever of 101.3, normal pulse, normal respirations, soft blood pressures in the 100-110s Labs: Glucose of 147. WBC 12.9. Hemoglobin 18.6. Fecal occult positive Imaging: CT abdomen/pelvis unremarkable. Medications/Course: Ciprofloxacin and Flagyl, Protonix, 2L NS boluses, zofran  Assessment & Plan:   Principal Problem:   GI bleed Active Problems:   Hypertension   PAF (paroxysmal atrial fibrillation) (HCC)   DM type 2 with diabetic peripheral neuropathy (HCC)   Nausea, vomiting and diarrhea  1-Gastroenteritis; Norovirus.  Presents with nausea, vomiting and diarrhea.  C diff negative.  Received IV cipro and flagyl. Discontinue due to positive norovirus.  IV fluids.  GI was consulted, no plan for endoscopy.  CT abdomen negative.  He is feeling better, tolerating diet.   Fever, leukocytosis;  Related to Gastroenteritis.  Chest x ray negative,.   DM, with peripheral neuropathy;  On lantus, SSI.  Continue with gabapentin.   PAF;  S/p ablation. Sinus on exam. No EKG available Resume Eliquis . Hb stable.   Hypercholesterolemia -Continue with statin.   BPH -on flomax   Discharge Diagnoses:   Principal Problem:   GI bleed Active Problems:   Hypertension   PAF (paroxysmal atrial fibrillation) (HCC)   DM type 2 with diabetic peripheral neuropathy (HCC)   Nausea, vomiting and diarrhea   Abdominal pain, vomiting, and diarrhea   Norovirus    Discharge Instructions  Discharge Instructions    Diet - low sodium heart healthy    Complete by:  As directed    Increase activity slowly    Complete by:  As directed      Allergies as of 11/02/2016   No Known Allergies     Medication List    TAKE these medications   acetaminophen 650 MG CR tablet Commonly known as:  TYLENOL Take 650 mg by mouth every 8 (eight) hours as needed for pain.   azelastine 0.1 % nasal spray Commonly known as:  ASTELIN PLACE 2 SPRAYS INTO BOTH NOSTRILS EVERY NIGHT AT BEDTIME AS NEEDED FOR RHINITIS What changed:  See the new instructions.   ELIQUIS 5 MG Tabs tablet Generic drug:  apixaban TAKE 1 TABLET(5 MG) BY MOUTH TWICE DAILY What changed:  See the new instructions.   fish oil-omega-3 fatty acids 1000 MG capsule Take 2 g by mouth 2 (two) times daily.   fluticasone 50 MCG/ACT nasal spray Commonly known as:  FLONASE Place 2 sprays into both nostrils See admin instructions. IN THE MORNING AS NEEDED FOR RHINITIS   FREESTYLE LIBRE SENSOR SYSTEM Misc 1 strip by Does not apply route once a week. Apply to upper arm and change sensor every 10 days   gabapentin 600 MG tablet Commonly known as:  NEURONTIN Take 600 mg by mouth 2 (two) times daily.  glucagon 1 MG injection Commonly known as:  GLUCAGON EMERGENCY Inject 1 mg into the vein once as needed.   insulin degludec 100 UNIT/ML Sopn FlexTouch Pen Commonly known as:  TRESIBA FLEXTOUCH Inject 0.2 mLs (20 Units total) into the skin at bedtime. What changed:  how much to take   Insulin Pen Needle 31G X 8 MM Misc Commonly known as:  B-D ULTRAFINE III SHORT PEN Use one per day   INSULIN SYRINGE .5CC/31GX5/16" 31G X 5/16" 0.5 ML Misc USE  ONE SYRINGE AS DIRECTED THREE TO FOUR TIMES DAILY   loratadine 10 MG tablet Commonly known as:  CLARITIN Take 10 mg by mouth daily.   NOVOLOG FLEXPEN 100 UNIT/ML FlexPen Generic drug:  insulin aspart Inject 5-10 Units into the skin 3 (three) times daily as needed for high blood sugar (IF BGL IS 100 OR GREATER).   ONETOUCH VERIO test strip Generic drug:  glucose blood TEST BLOOD SUGAR FOUR TIMES DAILY   simvastatin 20 MG tablet Commonly known as:  ZOCOR Take 0.5 tablets (10 mg total) by mouth at bedtime.   SYSTANE ULTRA PF 0.4-0.3 % Soln Generic drug:  Polyethyl Glyc-Propyl Glyc PF Apply 2 drops to eye 3 (three) times daily as needed (for dry eyes).   tamsulosin 0.4 MG Caps capsule Commonly known as:  FLOMAX Take 1 capsule (0.4 mg total) by mouth daily.   XIGDUO XR 10-998 MG Tb24 Generic drug:  Dapagliflozin-Metformin HCl ER TAKE ONE TABLET BY MOUTH TWICE DAILY -REPLACES  INVOKAMET  XR.      Follow-up Information    Wanda Plump, MD Follow up in 1 week(s).   Specialty:  Internal Medicine Contact information: [address] [place] [address] [phone]          No Known Allergies  Consultations:  GI   Procedures/Studies: Dg Chest 1 View  Result Date: 11/01/2016 CLINICAL DATA:  Sore throat, cough EXAM: CHEST 1 VIEW COMPARISON:  03/25/2014 FINDINGS: The heart size and mediastinal contours are within normal limits. Both lungs are clear. The visualized skeletal structures are unremarkable. IMPRESSION: No active disease. Electronically Signed   By: Elige Ko   On: 11/01/2016 11:36   [name] Contrast  Result Date: 10/25/2016 CLINICAL DATA:  56 year old male code stroke, left facial numbness. Dizziness. A fib. EXAM: MRI HEAD WITHOUT CONTRAST TECHNIQUE: Multiplanar, multiecho pulse sequences of the brain and surrounding structures were obtained without intravenous contrast. COMPARISON:  Head CTs without contrast 10/25/2016 and earlier.  FINDINGS: Brain: No restricted diffusion to suggest acute infarction. No midline shift, mass effect, evidence of mass lesion, ventriculomegaly, extra-axial collection or acute intracranial hemorrhage. Cervicomedullary junction and pituitary are within normal limits. No cortical encephalomalacia or chronic cerebral blood products identified. Scattered small foci of T2 and FLAIR hyperintensity in the bilateral cerebral white matter, extent is mild for age. Deep gray matter nuclei, brainstem, and cerebellum appear normal. Vascular: Major intracranial vascular flow voids are preserved. Skull and upper cervical spine: Negative aside from mild degenerative ligamentous hypertrophy about the odontoid. Normal bone marrow signal. Sinuses/Orbits: Negative orbits soft tissues. Ethmoid sinus mucosal thickening and opacification again noted. Trace maxillary sinus mucosal thickening. Other Visualized paranasal sinuses and mastoids are stable and well pneumatized. Other: Visible internal auditory structures appear normal. Negative scalp soft tissues. IMPRESSION: 1.  No acute intracranial abnormality. 2. Mild for age nonspecific cerebral white matter signal changes. 3. Mild to moderate ethmoid sinus inflammation. Electronically Signed   By: Odessa Fleming  M.D.   On: 10/25/2016 15:26   Mr Cervical Spine Wo Contrast  Result Date: 10/25/2016 CLINICAL DATA:  Face and neck numbness.  Bilateral finger tingling. EXAM: MRI CERVICAL SPINE WITHOUT CONTRAST TECHNIQUE: Multiplanar, multisequence MR imaging of the cervical spine was performed. No intravenous contrast was administered. COMPARISON:  None. FINDINGS: Alignment: Physiologic. Vertebrae: No fracture, evidence of discitis, or bone lesion. Cord: Normal signal and morphology when accounting for motion artifact. Posterior Fossa, vertebral arteries, paraspinal tissues: Negative. Disc levels: C2-3: Mild facet spurring on the right. Mild ligamentum flavum thickening. No impingement C3-4: Mild  disc narrowing with posterior disc osteophyte complex partially effacing the ventral subarachnoid space. Uncovertebral spurring with estimated mild to moderate foraminal narrowing on the left. C4-5: Unremarkable. C5-6: Left paracentral disc protrusion contacting the ventral cord without deformity. Patent foramina C6-7: Possible tiny central disc protrusion without neural contact. C7-T1:Unremarkable. IMPRESSION: 1. No acute finding or evidence of myelopathy. 2. C5-6 left paracentral disc protrusion contacting the cord without compression. 3. C3-4 borderline spinal stenosis. Mild to moderate left foraminal stenosis. 4. Sensitivity is diminished by intermittent motion. Electronically Signed   By: Marnee Spring M.D.   On: 10/25/2016 15:46   Ct Abdomen Pelvis W Contrast  Result Date: 10/31/2016 CLINICAL DATA:  Diffuse/ right lower quadrant abdominal pain EXAM: CT ABDOMEN AND PELVIS WITH CONTRAST TECHNIQUE: Multidetector CT imaging of the abdomen and pelvis was performed using the standard protocol following bolus administration of intravenous contrast. CONTRAST:  ISOVUE-300 IOPAMIDOL (ISOVUE-300) INJECTION 61% COMPARISON:  08/25/2016 FINDINGS: Lower chest: Lungs are essentially clear. Hepatobiliary: Liver is within normal limits. Gallbladder is within normal limits. No intrahepatic or extrahepatic ductal dilatation. Pancreas: Within normal limits. Spleen: Within normal limits. Adrenals/Urinary Tract: Adrenal glands are within normal limits. Kidneys are within normal limits.  No hydronephrosis. Bladder is within normal limits. Stomach/Bowel: Stomach is within normal limits. No evidence of bowel obstruction. Normal appendix (series 2/ image 58). Vascular/Lymphatic: Atherosclerotic calcifications of the aortic arch. No suspicious abdominopelvic lymphadenopathy. 8 mm aortocaval node (series 2/ image 49). Reproductive: Prostate is unremarkable. Other: No abdominopelvic ascites. Musculoskeletal: Degenerative changes  of the visualized thoracolumbar spine. IMPRESSION: No evidence bowel obstruction.  Normal appendix. No CT findings to account for the patient's abdominal pain. Electronically Signed   By: Charline Bills M.D.   On: 10/31/2016 11:29   Ct Head Code Stroke W/o Cm  Result Date: 10/25/2016 CLINICAL DATA:  Code stroke.  Left facial numbness. EXAM: CT HEAD WITHOUT CONTRAST TECHNIQUE: Contiguous axial images were obtained from the base of the skull through the vertex without intravenous contrast. COMPARISON:  12/23/2014 FINDINGS: Brain: No evidence of acute infarction, hemorrhage, hydrocephalus, extra-axial collection or mass lesion/mass effect. Vascular: No hyperdense vessel or unexpected calcification. Skull: Normal. Negative for fracture or focal lesion. Sinuses/Orbits: Negative Other: These results were called by telephone at the time of interpretation on 10/25/2016 at 1:47 pm to Dr. Pricilla Loveless , who verbally acknowledged these results. ASPECTS [facility] Stroke Program Early CT Score) - Ganglionic level infarction (caudate, lentiform nuclei, internal capsule, insula, M1-M3 cortex): 7 - Supraganglionic infarction (M4-M6 cortex): 3 Total score (0-10 with 10 being normal): 10 IMPRESSION: No acute finding. ASPECTS is 10. Electronically Signed   By: Marnee Spring M.D.   On: 10/25/2016 13:48       Subjective: He is feeling better, tolerating diet.  Diarrhea less watery   Discharge Exam: Vitals:   11/01/16 2120 11/02/16 0529  BP: 108/69 115/63  Pulse: (!) 49 (!) 49  Resp:  18 18  Temp: 98.5 F (36.9 C) 98.3 F (36.8 C)   Vitals:   11/01/16 0555 11/01/16 1342 11/01/16 2120 11/02/16 0529  BP: (!) 111/57 (!) 100/58 108/69 115/63  Pulse: (!) 59 (!) 54 (!) 49 (!) 49  Resp: 18 18 18 18   Temp: 98.5 F (36.9 C) 97.9 F (36.6 C) 98.5 F (36.9 C) 98.3 F (36.8 C)  TempSrc: Oral Oral Oral Oral  SpO2: 97% 96% 96% 96%  Weight:      Height:        General: Pt is alert, awake, not in acute  distress Cardiovascular: RRR, S1/S2 +, no rubs, no gallops Respiratory: CTA bilaterally, no wheezing, no rhonchi Abdominal: Soft, NT, ND, bowel sounds + Extremities: no edema, no cyanosis    The results of significant diagnostics from this hospitalization (including imaging, microbiology, ancillary and laboratory) are listed below for reference.     Microbiology: Recent Results (from the past 240 hour(s))  C difficile quick scan w PCR reflex     Status: None   Collection Time: 10/31/16  1:45 PM  Result Value Ref Range Status   C Diff antigen NEGATIVE NEGATIVE Final   C Diff toxin NEGATIVE NEGATIVE Final   C Diff interpretation No C. difficile detected.  Final    Comment: Performed at [facility], [address], [place], [place] [address]  Gastrointestinal Panel by PCR , Stool     Status: Abnormal   Collection Time: 10/31/16  9:10 PM  Result Value Ref Range Status   Campylobacter species NOT DETECTED NOT DETECTED Final   Plesimonas shigelloides NOT DETECTED NOT DETECTED Final   Salmonella species NOT DETECTED NOT DETECTED Final   Yersinia enterocolitica NOT DETECTED NOT DETECTED Final   Vibrio species NOT DETECTED NOT DETECTED Final   Vibrio cholerae NOT DETECTED NOT DETECTED Final   Enteroaggregative E coli (EAEC) NOT DETECTED NOT DETECTED Final   Enteropathogenic E coli (EPEC) NOT DETECTED NOT DETECTED Final   Enterotoxigenic E coli (ETEC) NOT DETECTED NOT DETECTED Final   Shiga like toxin producing E coli (STEC) NOT DETECTED NOT DETECTED Final   Shigella/Enteroinvasive E coli (EIEC) NOT DETECTED NOT DETECTED Final   Cryptosporidium NOT DETECTED NOT DETECTED Final   Cyclospora cayetanensis NOT DETECTED NOT DETECTED Final   Entamoeba histolytica NOT DETECTED NOT DETECTED Final   Giardia lamblia NOT DETECTED NOT DETECTED Final   Adenovirus F40/41 NOT DETECTED NOT DETECTED Final   Astrovirus NOT DETECTED NOT DETECTED Final   Norovirus GI/GII DETECTED (A) NOT DETECTED  Final    Comment: RESULT CALLED TO, READ BACK BY AND VERIFIED WITH: MARIE KELLY 11/01/16 1227 SGD RESULT CALLED TO, READ BACK BY AND VERIFIED WITH: B Evelene Croon, RN @1243  11/01/16 MKELLY,MLT    Rotavirus A NOT DETECTED NOT DETECTED Final   Sapovirus (I, II, IV, and V) NOT DETECTED NOT DETECTED Final     Labs: BNP (last 3 results) No results for input(s): BNP in the last 8760 hours. Basic Metabolic Panel:  Recent Labs Lab 10/31/16 0920 11/02/16 0553  NA 140 141  K 4.2 3.9  CL 103 111  CO2 26 25  GLUCOSE 147* 133*  BUN 21* 13  CREATININE 0.84 0.85  CALCIUM 9.2 8.1*   Liver Function Tests:  Recent Labs Lab 10/31/16 0920  AST 32  ALT 43  ALKPHOS 72  BILITOT 0.7  PROT 8.2*  ALBUMIN 4.5    Recent Labs Lab 10/31/16 0920  LIPASE 18   No results  for input(s): AMMONIA in the last 168 hours. CBC:  Recent Labs Lab 10/31/16 0920 11/01/16 0910 11/02/16 0553  WBC 12.9* 4.7 4.4  HGB 18.6* 14.3 14.4  HCT 53.7* 42.6 43.8  MCV 81.1 83.2 83.9  PLT 255 174 178   Cardiac Enzymes: No results for input(s): CKTOTAL, CKMB, CKMBINDEX, TROPONINI in the last 168 hours. BNP: Invalid input(s): POCBNP CBG:  Recent Labs Lab 11/01/16 2349 11/02/16 0321 11/02/16 0527 11/02/16 0759 11/02/16 1219  GLUCAP 101* 109* 111* 122* 117*   D-Dimer No results for input(s): DDIMER in the last 72 hours. Hgb A1c No results for input(s): HGBA1C in the last 72 hours. Lipid Profile No results for input(s): CHOL, HDL, LDLCALC, TRIG, CHOLHDL, LDLDIRECT in the last 72 hours. Thyroid function studies No results for input(s): TSH, T4TOTAL, T3FREE, THYROIDAB in the last 72 hours.  Invalid input(s): FREET3 Anemia work up No results for input(s): VITAMINB12, FOLATE, FERRITIN, TIBC, IRON, RETICCTPCT in the last 72 hours. Urinalysis    Component Value Date/Time   COLORURINE YELLOW 10/31/2016 1220   APPEARANCEUR CLEAR 10/31/2016 1220   LABSPEC >1.046 (H) 10/31/2016 1220   PHURINE 5.5 10/31/2016  1220   GLUCOSEU >=500 (A) 10/31/2016 1220   GLUCOSEU >=1000 (A) 04/10/2016 0902   HGBUR NEGATIVE 10/31/2016 1220   HGBUR negative 11/05/2009 1538   BILIRUBINUR NEGATIVE 10/31/2016 1220   BILIRUBINUR negative 07/06/2016 1728   KETONESUR NEGATIVE 10/31/2016 1220   PROTEINUR NEGATIVE 10/31/2016 1220   UROBILINOGEN 4.0 07/06/2016 1728   UROBILINOGEN 0.2 04/10/2016 0902   NITRITE NEGATIVE 10/31/2016 1220   LEUKOCYTESUR NEGATIVE 10/31/2016 1220   Sepsis Labs Invalid input(s): PROCALCITONIN,  WBC,  LACTICIDVEN Microbiology Recent Results (from the past 240 hour(s))  C difficile quick scan w PCR reflex     Status: None   Collection Time: 10/31/16  1:45 PM  Result Value Ref Range Status   C Diff antigen NEGATIVE NEGATIVE Final   C Diff toxin NEGATIVE NEGATIVE Final   C Diff interpretation No C. difficile detected.  Final    Comment: Performed at [facility], [address] [place], [place] [id number]  Gastrointestinal Panel by PCR , Stool     Status: Abnormal   Collection Time: 10/31/16  9:10 PM  Result Value Ref Range Status   Campylobacter species NOT DETECTED NOT DETECTED Final   Plesimonas shigelloides NOT DETECTED NOT DETECTED Final   Salmonella species NOT DETECTED NOT DETECTED Final   Yersinia enterocolitica NOT DETECTED NOT DETECTED Final   Vibrio species NOT DETECTED NOT DETECTED Final   Vibrio cholerae NOT DETECTED NOT DETECTED Final   Enteroaggregative E coli (EAEC) NOT DETECTED NOT DETECTED Final   Enteropathogenic E coli (EPEC) NOT DETECTED NOT DETECTED Final   Enterotoxigenic E coli (ETEC) NOT DETECTED NOT DETECTED Final   Shiga like toxin producing E coli (STEC) NOT DETECTED NOT DETECTED Final   Shigella/Enteroinvasive E coli (EIEC) NOT DETECTED NOT DETECTED Final   Cryptosporidium NOT DETECTED NOT DETECTED Final   Cyclospora cayetanensis NOT DETECTED NOT DETECTED Final   Entamoeba histolytica NOT DETECTED NOT DETECTED Final   Giardia lamblia NOT DETECTED NOT  DETECTED Final   Adenovirus F40/41 NOT DETECTED NOT DETECTED Final   Astrovirus NOT DETECTED NOT DETECTED Final   Norovirus GI/GII DETECTED (A) NOT DETECTED Final    Comment: RESULT CALLED TO, READ BACK BY AND VERIFIED WITH: MARIE KELLY 11/01/16 1227 SGD RESULT CALLED TO, READ BACK BY AND VERIFIED WITH: Orvilla FusB KAUR, RN @1243  11/01/16 MKELLY,MLT  Rotavirus A NOT DETECTED NOT DETECTED Final   Sapovirus (I, II, IV, and V) NOT DETECTED NOT DETECTED Final     Time coordinating discharge: Over 30 minutes  SIGNED:   Alba Cory, MD  [facility] 11/02/2016, 12:57 PM Pager [phone]  If 7PM-7AM, please contact night-coverage [email or web address] Password [email or web address]

## 2016-11-03 ENCOUNTER — Telehealth: Payer: Self-pay

## 2016-11-03 NOTE — Telephone Encounter (Signed)
11/03/16  Hospital Follow Up  Transition Care Management Follow-up Telephone Call  ADMISSION DATE: 10/31/16   DISCHARGE DATE: 11/02/16    How have you been since you were released from the hospital? Patient states he has been feeling well since discharge..     Do you understand why you were in the hospital? YES   Do you understand the discharge instrcutions? Yes  Items Reviewed:  Medications reviewed:  Medications Reviewed with patient.  Allergies reviewed: NKDA  Dietary changes reviewed:Low Sodium Heart Healthy  Referrals reviewed: PCP Scheduled   Functional Questionnaire:   Activities of Daily Living (ADLs):  No help needed at this time   Any transportation issues/concerns?: No        Do you have anyone to tranport you if needed. Yes   Any patient concerns? No concerns at this time.   Confirmed importance and date/time of follow-up visits scheduled: Yes    Confirmed with patient if condition begins to worsen call PCP or go to the ER. Yes        Patient was given the Call-a-Nurse line [phone]: Yes

## 2016-11-03 NOTE — Telephone Encounter (Signed)
Hospital follow up call completed with patient.

## 2016-11-03 NOTE — Telephone Encounter (Signed)
Please do

## 2016-11-03 NOTE — Telephone Encounter (Signed)
Work note released to MyChart as requested.  

## 2016-11-04 ENCOUNTER — Other Ambulatory Visit: Payer: Self-pay | Admitting: Endocrinology

## 2016-11-13 ENCOUNTER — Encounter: Payer: Self-pay | Admitting: Internal Medicine

## 2016-11-13 ENCOUNTER — Ambulatory Visit (INDEPENDENT_AMBULATORY_CARE_PROVIDER_SITE_OTHER): Payer: BC Managed Care – PPO | Admitting: Internal Medicine

## 2016-11-13 VITALS — BP 126/66 | HR 76 | Temp 97.6°F | Resp 14 | Ht 71.0 in | Wt 212.5 lb

## 2016-11-13 DIAGNOSIS — E1142 Type 2 diabetes mellitus with diabetic polyneuropathy: Secondary | ICD-10-CM

## 2016-11-13 DIAGNOSIS — Z8619 Personal history of other infectious and parasitic diseases: Secondary | ICD-10-CM | POA: Diagnosis not present

## 2016-11-13 DIAGNOSIS — A0811 Acute gastroenteropathy due to Norwalk agent: Secondary | ICD-10-CM

## 2016-11-13 NOTE — Progress Notes (Signed)
Pre visit review using our clinic review tool, if applicable. No additional management support is needed unless otherwise documented below in the visit note. 

## 2016-11-13 NOTE — Patient Instructions (Addendum)
GO TO THE LAB : Get the blood work    See you in September for your yearly exam

## 2016-11-13 NOTE — Progress Notes (Signed)
Subjective:    Patient ID: Danny [name], male    DOB: 1961/04/04, 56 y.o.   MRN: [id number]  DOS:  11/13/2016 Type of visit - description :TCM 1014 Interval history: After he was seen in the office, he was admitted to the hospital and discharge 11/02/2016. He was admitted with nausea, vomiting, diarrhea, fever. C. difficile negative, initially received IV fluids, Cipro, Flagyl. Subsequently testing showed norovirus. CT abdomen negative GI was consulted, no endoscopy was necessary. Other than above, chronic with medical problems were stable he gradually improved and was discharged home  Review of Systems  Since he left the hospital he is feeling well, gradually recovering, almost back to normal. Appetite is good. No fever chills, no stomach pain or blood in the stools. CBGs are very good, this morning was 97, no low CBGs.  Past Medical History:  Diagnosis Date  . Allergic rhinitis   . Benign prostatic hypertrophy    s/p TUNA procedure aprx 2006,  . Carpal tunnel syndrome 06/17/2009  . Diabetes mellitus    Type II dx 1999  . 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 ED (erectile dysfunction)    After TUNA  . Elevated LFTs 2010   (-) chronic hep  panel  . History of cardiovascular stress test 8/11   Negative  . Hyperlipidemia   . HYPERSOMNIA UNSPECIFIED 12/11/2007  . Hypertension   . INSOMNIA-SLEEP DISORDER-UNSPEC 04/12/2010  . Obesity   . PAF (paroxysmal atrial fibrillation) (HCC) 12/21/2009   a. Failed Multaq. b. Prev on flecainide. c. s/p afib ablation 2015.    Past Surgical History:  Procedure Laterality Date  . ABLATION  03-24-14   PVI by Dr Johney Frame  . ATRIAL FIBRILLATION ABLATION N/A 03/24/2014   Procedure: ATRIAL FIBRILLATION ABLATION;  Surgeon: Gardiner Rhyme, MD;  Location: [facility] CATH LAB;  Service: Cardiovascular;  Laterality: N/A;  . CYSTOSCOPY  11-09   neg  @ urology, was rx Flomax  . PROSTATE SURGERY     TUNA  . TEE WITHOUT  CARDIOVERSION N/A 03/23/2014   Procedure: TRANSESOPHAGEAL ECHOCARDIOGRAM (TEE);  Surgeon: Pricilla Riffle, MD;  Location: [facility] ENDOSCOPY;  Service: Cardiovascular;  Laterality: N/A;  . VASECTOMY     several years ago    Social History   Social History  . Marital status: Married    Spouse name: N/A  . Number of children: 2  . Years of education: N/A   Occupational History  . Works for [employer] ([employer]  Drives a Scientist, product/process development [employer]    works for [employer] ([employer])   Social History Main Topics  . Smoking status: Former Smoker    Types: Cigarettes    Quit date: 06/26/1985  . Smokeless tobacco: Never Used  . Alcohol use No     Comment:    . Drug use: No  . Sexual activity: Not on file   Other Topics Concern  . Not on file   Social History Narrative   has a girlfriend, lives in [place]               Allergies as of 11/13/2016   No Known Allergies     Medication List       Accurate as of 11/13/16 11:59 PM. Always use your most recent med list.          acetaminophen 650 MG CR tablet Commonly known as:  TYLENOL Take 650 mg by mouth every 8 (eight) hours as needed for pain.  azelastine 0.1 % nasal spray Commonly known as:  ASTELIN PLACE 2 SPRAYS INTO BOTH NOSTRILS EVERY NIGHT AT BEDTIME AS NEEDED FOR RHINITIS   ELIQUIS 5 MG Tabs tablet Generic drug:  apixaban TAKE 1 TABLET(5 MG) BY MOUTH TWICE DAILY   fish oil-omega-3 fatty acids 1000 MG capsule Take 2 g by mouth 2 (two) times daily.   fluticasone 50 MCG/ACT nasal spray Commonly known as:  FLONASE Place 2 sprays into both nostrils See admin instructions. IN THE MORNING AS NEEDED FOR RHINITIS   FREESTYLE LIBRE SENSOR SYSTEM Misc 1 strip by Does not apply route once a week. Apply to upper arm and change sensor every 10 days   gabapentin 600 MG tablet Commonly known as:  NEURONTIN Take 600 mg by mouth 2 (two) times daily.   glucagon 1 MG injection Commonly known as:  GLUCAGON EMERGENCY Inject 1 mg into the vein once  as needed.   insulin degludec 100 UNIT/ML Sopn FlexTouch Pen Commonly known as:  TRESIBA FLEXTOUCH Inject 0.2 mLs (20 Units total) into the skin at bedtime.   Insulin Pen Needle 31G X 8 MM Misc Commonly known as:  B-D ULTRAFINE III SHORT PEN Use one per day   INSULIN SYRINGE .5CC/31GX5/16" 31G X 5/16" 0.5 ML Misc USE ONE SYRINGE AS DIRECTED THREE TO FOUR TIMES DAILY   loratadine 10 MG tablet Commonly known as:  CLARITIN Take 10 mg by mouth daily.   NOVOLOG FLEXPEN 100 UNIT/ML FlexPen Generic drug:  insulin aspart Inject 5-10 Units into the skin 3 (three) times daily as needed for high blood sugar (IF BGL IS 100 OR GREATER).   ONETOUCH VERIO test strip Generic drug:  glucose blood TEST BLOOD SUGAR FOUR TIMES DAILY   simvastatin 20 MG tablet Commonly known as:  ZOCOR Take 0.5 tablets (10 mg total) by mouth at bedtime.   SYSTANE ULTRA PF 0.4-0.3 % Soln Generic drug:  Polyethyl Glyc-Propyl Glyc PF Apply 2 drops to eye 3 (three) times daily as needed (for dry eyes).   tamsulosin 0.4 MG Caps capsule Commonly known as:  FLOMAX Take 1 capsule (0.4 mg total) by mouth daily.   XIGDUO XR 10-998 MG Tb24 Generic drug:  Dapagliflozin-Metformin HCl ER TAKE ONE TABLET BY MOUTH TWICE DAILY -REPLACES  INVOKAMET  XR.          Objective:   Physical Exam BP 126/66 (BP Location: Left Arm, Patient Position: Sitting, Cuff Size: Normal)   Pulse 76   Temp 97.6 F (36.4 C) (Oral)   Resp 14   Ht 5\' 11"  (1.803 m)   Wt 212 lb 8 oz (96.4 kg)   SpO2 97%   BMI 29.64 kg/m  General:   Well developed, well nourished . NAD.  HEENT:  Normocephalic . Face symmetric, atraumatic Lungs:  CTA B Normal respiratory effort, no intercostal retractions, no accessory muscle use. Heart: RRR,  no murmur.  no pretibial edema bilaterally  Abdomen:  Not distended, soft, non-tender. No rebound or rigidity.  Skin: Not pale. Not jaundice Neurologic:  alert & oriented X3.  Speech normal, gait  appropriate for age and unassisted Psych--  Cognition and judgment appear intact.  Cooperative with normal attention span and concentration.  Behavior appropriate. No anxious or depressed appearing.    Assessment & Plan:   Assessment DM, w/ neuropathy, Dr Lucianne MussKumar HTN Hyperlipidemia Atrial fibrillation, 2011, s/p ablation, now sinus rhythm, anticoagulated BPH s/p TUNA ED S/P shingles, postherpetic neuralgia R foot  PLAN Gastroenteritis due to norovirus: Resolved. Now  asxc and doing well. Will check a BMP and CBC. Otherwise follow-up in September as scheduled. DM: Back on his normal medications, CBG this morning 97, no hypoglycemia per patient. RTC 02-2017 CPX

## 2016-11-14 LAB — BASIC METABOLIC PANEL
BUN: 16 mg/dL (ref 6–23)
CO2: 29 mEq/L (ref 19–32)
Calcium: 9.6 mg/dL (ref 8.4–10.5)
Chloride: 99 mEq/L (ref 96–112)
Creatinine, Ser: 0.96 mg/dL (ref 0.40–1.50)
GFR: 85.55 mL/min (ref >60.00)
Glucose, Bld: 183 mg/dL — ABNORMAL HIGH (ref 70–99)
POTASSIUM: 4.1 meq/L (ref 3.5–5.1)
SODIUM: 138 meq/L (ref 135–145)

## 2016-11-14 LAB — CBC WITH DIFFERENTIAL/PLATELET
Basophils Absolute: 0.1 10*3/uL (ref 0.0–0.1)
Basophils Relative: 1.1 % (ref 0.0–3.0)
EOS PCT: 4.4 % (ref 0.0–5.0)
Eosinophils Absolute: 0.4 10*3/uL (ref 0.0–0.7)
HEMATOCRIT: 48.1 % (ref 39.0–52.0)
Hemoglobin: 16 g/dL (ref 13.0–17.0)
Lymphocytes Relative: 27.3 % (ref 12.0–46.0)
Lymphs Abs: 2.4 10*3/uL (ref 0.7–4.0)
MCHC: 33.3 g/dL (ref 30.0–36.0)
MCV: 82.9 fl (ref 78.0–100.0)
MONO ABS: 0.7 10*3/uL (ref 0.1–1.0)
MONOS PCT: 7.5 % (ref 3.0–12.0)
Neutro Abs: 5.2 10*3/uL (ref 1.4–7.7)
Neutrophils Relative %: 59.7 % (ref 43.0–77.0)
Platelets: 300 10*3/uL (ref 150.0–400.0)
RBC: 5.81 Mil/uL (ref 4.22–5.81)
RDW: 13.8 % (ref 11.5–15.5)
WBC: 8.7 10*3/uL (ref 4.0–10.5)

## 2016-11-14 NOTE — Assessment & Plan Note (Signed)
Gastroenteritis due to norovirus: Resolved. Now asxc and doing well. Will check a BMP and CBC. Otherwise follow-up in September as scheduled. DM: Back on his normal medications, CBG this morning 97, no hypoglycemia per patient. RTC 02-2017 CPX

## 2016-11-15 ENCOUNTER — Encounter: Payer: BC Managed Care – PPO | Admitting: Internal Medicine

## 2016-11-29 ENCOUNTER — Other Ambulatory Visit: Payer: Self-pay | Admitting: Internal Medicine

## 2016-11-30 ENCOUNTER — Other Ambulatory Visit: Payer: Self-pay | Admitting: Endocrinology

## 2016-12-04 ENCOUNTER — Other Ambulatory Visit (INDEPENDENT_AMBULATORY_CARE_PROVIDER_SITE_OTHER): Payer: BC Managed Care – PPO

## 2016-12-04 DIAGNOSIS — E119 Type 2 diabetes mellitus without complications: Secondary | ICD-10-CM

## 2016-12-04 LAB — HEMOGLOBIN A1C: HEMOGLOBIN A1C: 6.3 % (ref 4.6–6.5)

## 2016-12-04 LAB — BASIC METABOLIC PANEL
BUN: 18 mg/dL (ref 6–23)
CALCIUM: 9.6 mg/dL (ref 8.4–10.5)
CHLORIDE: 102 meq/L (ref 96–112)
CO2: 26 mEq/L (ref 19–32)
CREATININE: 0.93 mg/dL (ref 0.40–1.50)
GFR: 88.73 mL/min (ref >60.00)
Glucose, Bld: 134 mg/dL — ABNORMAL HIGH (ref 70–99)
Potassium: 3.9 mEq/L (ref 3.5–5.1)
Sodium: 136 mEq/L (ref 135–145)

## 2016-12-08 ENCOUNTER — Encounter: Payer: Self-pay | Admitting: Endocrinology

## 2016-12-08 ENCOUNTER — Ambulatory Visit (INDEPENDENT_AMBULATORY_CARE_PROVIDER_SITE_OTHER): Payer: BC Managed Care – PPO | Admitting: Endocrinology

## 2016-12-08 VITALS — BP 128/80 | HR 72 | Ht 71.0 in | Wt 204.2 lb

## 2016-12-08 DIAGNOSIS — E1165 Type 2 diabetes mellitus with hyperglycemia: Secondary | ICD-10-CM | POA: Diagnosis not present

## 2016-12-08 DIAGNOSIS — Z794 Long term (current) use of insulin: Secondary | ICD-10-CM | POA: Diagnosis not present

## 2016-12-08 DIAGNOSIS — E1142 Type 2 diabetes mellitus with diabetic polyneuropathy: Secondary | ICD-10-CM

## 2016-12-08 MED ORDER — INSULIN DEGLUDEC 100 UNIT/ML ~~LOC~~ SOPN: 40.0000 [IU] | PEN_INJECTOR | Freq: Every day | SUBCUTANEOUS | 5 refills | Status: DC

## 2016-12-08 NOTE — Progress Notes (Signed)
Patient ID: Danny [name], male   DOB: 19-Dec-1960, 56 y.o.   MRN: [id number]     Reason for Appointment: Followup for Type 2 Diabetes  Referring physician: Drue Novel  History of Present Illness:          Diagnosis: Type 2 diabetes mellitus, date of diagnosis: 1992       Past history:  He has had long-standing diabetes and previously was treated with metformin, Amaryl, Januvia and Actos He was taken off metformin because of fear of liver problems.  May have been taken taken off Actos because of swelling of his legs but was on 45 mg at that time Over the last 2 years at least his A1c has been consistently over 8% He was started on insulin in 2011 probably when his A1c was over 9% Initially was given Lantus and Humalog and subsequently switched to 70/30 twice a day for unknown reasons Has not taken Byetta or Victoza in the past He had been on 70/30 insulin initially for a few years Previously A1c was 8% or more consistently before starting Invokamet. He was switched from Lantus to [place] in 1/17  Recent history:   INSULIN regimen is described as:  Tresiba 25 units at 9 pm Novolog 5-10 ac bid-tid Oral hypoglycemic drugs the patient is taking are: Xigduo, 10/998, 2 tablets daily  His blood sugars are continuing to be well controlled with A1c 6.3 This is slightly higher than usual, usually below 6%    Current management, blood sugar patterns and problems identified:  He says after his last visit he was able to continually reduce his Evaristo Bury insulin because of low-normal sugars in the mornings  He has cut down 20 units since his previous visit  Also has lost weight partly because of gastroenteritis recently  His blood sugars appear to be mostly higher in the morning and as usual lowest at lunchtime  FASTING blood sugars are averaging about 20 mg higher than his usual  Although he has been using the [employer] he has not been able to make it stick consistently and  has only about a week's worth of recording for review  This appears to show that he is tending to have some high readings occasionally after lunch but sugars are reasonably controlled after breakfast and supper overall  His overnight blood sugars are somewhat variable on the sensor but generally within range  No recent hypoglycemia  He still arbitrarily taking insulin at mealtimes based on what he is eating and pre-meal blood sugar  He only takes insulin at lunchtime if blood sugars high before eating but has not used the sensor to guide him for mealtime dosing           Side effects from medications have been: None  Compliance with the medical regimen: Good  Glucose monitoring:  done 3-4 times a day         Glucometer: One Touch Verio.      Blood Glucose readings  from download:  Mean values apply above for all meters except median for One Touch  PRE-MEAL Fasting Lunch Dinner Bedtime Overall  Glucose range: 102-165    106-202    Mean/median: 140  109  115   121    Average readings from the freestyle Libre: Has readings between 62 and 6/6 only  PRE-MEAL Fasting Lunch Dinner Bedtime Overall  Glucose range:       Mean/median: 132  117  133   136  POST-MEAL PC Breakfast PC Lunch PC Dinner  Glucose range:     Mean/median: 152  157  150     Glycemic control:   Lab Results  Component Value Date   HGBA1C 6.3 12/04/2016   HGBA1C 6.0 08/18/2016   HGBA1C 5.9 05/16/2016   Lab Results  Component Value Date   MICROALBUR <0.7 08/18/2016   LDLCALC 27 05/16/2016   CREATININE 0.93 12/04/2016    Retinal exam: annual    Self-care:  Meals: 3 meals per day. Breakfast is eggs or cereal usually,  controlling portions      Dietician visit: Most recent: years ago.    Exercise:  he is active with moving equipment at work and some walking, does drive a truck                      Weight history:  Wt Readings from Last 3 Encounters:  12/08/16 204 lb 3.2 oz (92.6 kg)  11/13/16 212  lb 8 oz (96.4 kg)  10/31/16 211 lb 3.2 oz (95.8 kg)    Lab on 12/04/2016  Component Date Value Ref Range Status  . Hgb A1c MFr Bld 12/04/2016 6.3  4.6 - 6.5 % Final   Glycemic Control Guidelines for People with Diabetes:Non Diabetic:  <6%Goal of Therapy: <7%Additional Action Suggested:  >8%   . Sodium 12/04/2016 136  135 - 145 mEq/L Final  . Potassium 12/04/2016 3.9  3.5 - 5.1 mEq/L Final  . Chloride 12/04/2016 102  96 - 112 mEq/L Final  . CO2 12/04/2016 26  19 - 32 mEq/L Final  . Glucose, Bld 12/04/2016 134* 70 - 99 mg/dL Final  . BUN 78/29/5621 18  6 - 23 mg/dL Final  . Creatinine, Ser 12/04/2016 0.93  0.40 - 1.50 mg/dL Final  . Calcium 30/86/5784 9.6  8.4 - 10.5 mg/dL Final  . GFR 69/62/9528 88.73  >60.00 mL/min Final    Allergies as of 12/08/2016   No Known Allergies     Medication List       Accurate as of 12/08/16  4:46 PM. Always use your most recent med list.          acetaminophen 650 MG CR tablet Commonly known as:  TYLENOL Take 650 mg by mouth every 8 (eight) hours as needed for pain.   azelastine 0.1 % nasal spray Commonly known as:  ASTELIN PLACE 2 SPRAYS INTO BOTH NOSTRILS EVERY NIGHT AT BEDTIME AS NEEDED FOR RHINITIS   ELIQUIS 5 MG Tabs tablet Generic drug:  apixaban TAKE 1 TABLET(5 MG) BY MOUTH TWICE DAILY   fish oil-omega-3 fatty acids 1000 MG capsule Take 2 g by mouth 2 (two) times daily.   fluticasone 50 MCG/ACT nasal spray Commonly known as:  FLONASE Place 2 sprays into both nostrils See admin instructions. IN THE MORNING AS NEEDED FOR RHINITIS   FREESTYLE LIBRE SENSOR SYSTEM Misc 1 strip by Does not apply route once a week. Apply to upper arm and change sensor every 10 days   gabapentin 600 MG tablet Commonly known as:  NEURONTIN Take 600 mg by mouth 2 (two) times daily.   glucagon 1 MG injection Commonly known as:  GLUCAGON EMERGENCY Inject 1 mg into the vein once as needed.   insulin degludec 100 UNIT/ML Sopn FlexTouch Pen Commonly  known as:  TRESIBA FLEXTOUCH Inject 0.4 mLs (40 Units total) into the skin at bedtime.   Insulin Pen Needle 31G X 8 MM Misc Commonly known as:  B-D ULTRAFINE  III SHORT PEN Use one per day   INSULIN SYRINGE .5CC/31GX5/16" 31G X 5/16" 0.5 ML Misc USE ONE SYRINGE AS DIRECTED THREE TO FOUR TIMES DAILY   loratadine 10 MG tablet Commonly known as:  CLARITIN Take 10 mg by mouth daily.   NOVOLOG FLEXPEN 100 UNIT/ML FlexPen Generic drug:  insulin aspart Inject 5-10 Units into the skin 3 (three) times daily as needed for high blood sugar (IF BGL IS 100 OR GREATER).   ONETOUCH VERIO test strip Generic drug:  glucose blood TEST BLOOD SUGAR FOUR TIMES DAILY   simvastatin 20 MG tablet Commonly known as:  ZOCOR Take 0.5 tablets (10 mg total) by mouth at bedtime.   SYSTANE ULTRA PF 0.4-0.3 % Soln Generic drug:  Polyethyl Glyc-Propyl Glyc PF Apply 2 drops to eye 3 (three) times daily as needed (for dry eyes).   tamsulosin 0.4 MG Caps capsule Commonly known as:  FLOMAX Take 1 capsule (0.4 mg total) by mouth daily.   XIGDUO XR 10-998 MG Tb24 Generic drug:  Dapagliflozin-Metformin HCl ER TAKE 1 TABLET BY MOUTH TWICE DAILY -REPLACES  INVOKAMET  XR.       Allergies:  No Known Allergies  Past Medical History:  Diagnosis Date  . Allergic rhinitis   . Benign prostatic hypertrophy    s/p TUNA procedure aprx 2006,  . Carpal tunnel syndrome 06/17/2009  . Diabetes mellitus    Type II dx 1999  . 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 ED (erectile dysfunction)    After TUNA  . Elevated LFTs 2010   (-) chronic hep  panel  . History of cardiovascular stress test 8/11   Negative  . Hyperlipidemia   . HYPERSOMNIA UNSPECIFIED 12/11/2007  . Hypertension   . INSOMNIA-SLEEP DISORDER-UNSPEC 04/12/2010  . Obesity   . PAF (paroxysmal atrial fibrillation) (HCC) 12/21/2009   a. Failed Multaq. b. Prev on flecainide. c. s/p afib ablation 2015.    Past Surgical History:    Procedure Laterality Date  . ABLATION  03-24-14   PVI by Dr Johney Frame  . ATRIAL FIBRILLATION ABLATION N/A 03/24/2014   Procedure: ATRIAL FIBRILLATION ABLATION;  Surgeon: Gardiner Rhyme, MD;  Location: [facility] CATH LAB;  Service: Cardiovascular;  Laterality: N/A;  . CYSTOSCOPY  11-09   neg  @ urology, was rx Flomax  . PROSTATE SURGERY     TUNA  . TEE WITHOUT CARDIOVERSION N/A 03/23/2014   Procedure: TRANSESOPHAGEAL ECHOCARDIOGRAM (TEE);  Surgeon: Pricilla Riffle, MD;  Location: [facility] ENDOSCOPY;  Service: Cardiovascular;  Laterality: N/A;  . VASECTOMY     several years ago    Family History  Problem Relation Age of Onset  . Heart disease Mother        M-- MI,CHF  . Diabetes Mother   . Hypertension Mother   . Hypertension Father   . Cancer Neg Hx        Negative FH of Colon or Prostate Cancer    Social History:  reports that he quit smoking about 31 years ago. His smoking use included Cigarettes. He has never used smokeless tobacco. He reports that he does not drink alcohol or use drugs.    Review of Systems       Lipids: He has been on low dose simvastatin for a few years, LDL Is relatively low along with HDL also Also taking fish oil      Lab Results  Component Value Date   CHOL 94 05/16/2016   HDL 33.10 (  L) 05/16/2016   LDLCALC 27 05/16/2016   TRIG 168.0 (H) 05/16/2016   CHOLHDL 3 05/16/2016               No hypertension: Currently not on ACE inhibitor, Blood pressure higher on first measurement today       For several years has a history of Numbness, tingling  in feet and lower legs On gabapentin 2 times a day, not clear if he is getting relief with this    LABS:  Lab on 12/04/2016  Component Date Value Ref Range Status  . Hgb A1c MFr Bld 12/04/2016 6.3  4.6 - 6.5 % Final   Glycemic Control Guidelines for People with Diabetes:Non Diabetic:  <6%Goal of Therapy: <7%Additional Action Suggested:  >8%   . Sodium 12/04/2016 136  135 - 145 mEq/L Final  . Potassium 12/04/2016 3.9   3.5 - 5.1 mEq/L Final  . Chloride 12/04/2016 102  96 - 112 mEq/L Final  . CO2 12/04/2016 26  19 - 32 mEq/L Final  . Glucose, Bld 12/04/2016 134* 70 - 99 mg/dL Final  . BUN 84/16/6063 18  6 - 23 mg/dL Final  . Creatinine, Ser 12/04/2016 0.93  0.40 - 1.50 mg/dL Final  . Calcium 01/60/1093 9.6  8.4 - 10.5 mg/dL Final  . GFR 23/55/7322 88.73  >60.00 mL/min Final    Physical Examination:  BP 128/80   Pulse 72   Ht 5\' 11"  (1.803 m)   Wt 204 lb 3.2 oz (92.6 kg)   SpO2 96%   BMI 28.48 kg/m   ASSESSMENT/PLAN:    Diabetes type 2  with BMI 29 on insulin See history of present illness for detailed discussion of current diabetes management, blood sugar patterns and problems identified  His A1c is fairly good than now 6.3 although relatively higher than before Although he has reduced his basal insulin drastically since his last visit he is now starting to require more because of tendency to higher fasting readings as seen on his meter and sensor Post prandial readings are well controlled Also likely benefiting from [place] and renal function/potassium are stable  Negative urine microalbumin recently  Recommendations today: He will increase Tresiba by 2 units at least Discussed adjusting this based on fasting glucose  He needs to use the skin tac wipes to prepare his site for the sensor and samples given, he can order this either from his medical supply company or [employer] He can use different sites for the sensor Discussed that he if he is eating more carbohydrate at lunch he needs to take NovoLog to cover this He needs to pay attention to how his sugars behave after meals to help guide his insulin dosage No change in oral medications  ?  Neuropathy: He has minimal symptoms and he can take gabapentin as needed only  Patient Instructions  Tresiba 27 units daily     Counseling time on subjects discussed above is over 50% of today's 25 minute visit   Kiree Dejarnette 12/08/2016, 4:46 PM     Note: This office note was prepared with Dragon voice recognition system technology. Any transcriptional errors that result from this process are unintentional. .

## 2016-12-08 NOTE — Patient Instructions (Addendum)
Tresiba 27 units daily

## 2016-12-21 ENCOUNTER — Other Ambulatory Visit: Payer: Self-pay | Admitting: Internal Medicine

## 2017-01-04 ENCOUNTER — Encounter: Payer: Self-pay | Admitting: Neurology

## 2017-01-04 ENCOUNTER — Ambulatory Visit (INDEPENDENT_AMBULATORY_CARE_PROVIDER_SITE_OTHER): Payer: BC Managed Care – PPO | Admitting: Neurology

## 2017-01-04 VITALS — BP 110/80 | HR 76 | Ht 71.0 in | Wt 203.3 lb

## 2017-01-04 DIAGNOSIS — E1142 Type 2 diabetes mellitus with diabetic polyneuropathy: Secondary | ICD-10-CM

## 2017-01-04 DIAGNOSIS — E785 Hyperlipidemia, unspecified: Secondary | ICD-10-CM | POA: Diagnosis not present

## 2017-01-04 DIAGNOSIS — G459 Transient cerebral ischemic attack, unspecified: Secondary | ICD-10-CM

## 2017-01-04 DIAGNOSIS — I480 Paroxysmal atrial fibrillation: Secondary | ICD-10-CM

## 2017-01-04 DIAGNOSIS — I10 Essential (primary) hypertension: Secondary | ICD-10-CM | POA: Diagnosis not present

## 2017-01-04 NOTE — Patient Instructions (Signed)
1.  We will check carotid doppler 2.  Continue other medications 3.  Start exercising with goal of at least 30 minutes 5 days a week 4.  Follow up in 3 months 5.   Mediterranean Diet A Mediterranean diet refers to food and lifestyle choices that are based on the traditions of countries located on the [place]. This way of eating has been shown to help prevent certain conditions and improve outcomes for people who have chronic diseases, like kidney disease and heart disease. What are tips for following this plan? Lifestyle  Cook and eat meals together with your family, when possible.  Drink enough fluid to keep your urine clear or pale yellow.  Be physically active every day. This includes: ? Aerobic exercise like running or swimming. ? Leisure activities like gardening, walking, or housework.  Get 7-8 hours of sleep each night.  If recommended by your health care provider, drink red wine in moderation. This means 1 glass a day for nonpregnant women and 2 glasses a day for men. A glass of wine equals 5 oz (150 mL). Reading food labels  Check the serving size of packaged foods. For foods such as rice and pasta, the serving size refers to the amount of cooked product, not dry.  Check the total fat in packaged foods. Avoid foods that have saturated fat or trans fats.  Check the ingredients list for added sugars, such as corn syrup. Shopping  At the grocery store, buy most of your food from the areas near the walls of the store. This includes: ? Fresh fruits and vegetables (produce). ? Grains, beans, nuts, and seeds. Some of these may be available in unpackaged forms or large amounts (in bulk). ? Fresh seafood. ? Poultry and eggs. ? Low-fat dairy products.  Buy whole ingredients instead of prepackaged foods.  Buy fresh fruits and vegetables in-season from local farmers markets.  Buy frozen fruits and vegetables in resealable bags.  If you do not have access to quality  fresh seafood, buy precooked frozen shrimp or canned fish, such as tuna, salmon, or sardines.  Buy small amounts of raw or cooked vegetables, salads, or olives from the deli or salad bar at your store.  Stock your pantry so you always have certain foods on hand, such as olive oil, canned tuna, canned tomatoes, rice, pasta, and beans. Cooking  Cook foods with extra-virgin olive oil instead of using butter or other vegetable oils.  Have meat as a side dish, and have vegetables or grains as your main dish. This means having meat in small portions or adding small amounts of meat to foods like pasta or stew.  Use beans or vegetables instead of meat in common dishes like chili or lasagna.  Experiment with different cooking methods. Try roasting or broiling vegetables instead of steaming or sauteing them.  Add frozen vegetables to soups, stews, pasta, or rice.  Add nuts or seeds for added healthy fat at each meal. You can add these to yogurt, salads, or vegetable dishes.  Marinate fish or vegetables using olive oil, lemon juice, garlic, and fresh herbs. Meal planning  Plan to eat 1 vegetarian meal one day each week. Try to work up to 2 vegetarian meals, if possible.  Eat seafood 2 or more times a week.  Have healthy snacks readily available, such as: ? Vegetable sticks with hummus. ? [place] yogurt. ? Fruit and nut trail mix.  Eat balanced meals throughout the week. This includes: ? Fruit: 2-3 servings  a day ? Vegetables: 4-5 servings a day ? Low-fat dairy: 2 servings a day ? Fish, poultry, or lean meat: 1 serving a day ? Beans and legumes: 2 or more servings a week ? Nuts and seeds: 1-2 servings a day ? Whole grains: 6-8 servings a day ? Extra-virgin olive oil: 3-4 servings a day  Limit red meat and sweets to only a few servings a month What are my food choices?  Mediterranean diet ? Recommended ? Grains: Whole-grain pasta. Brown rice. Bulgar wheat. Polenta. Couscous.  Whole-wheat bread. Orpah Cobb. ? Vegetables: Artichokes. Beets. Broccoli. Cabbage. Carrots. Eggplant. Green beans. Chard. Kale. Spinach. Onions. Leeks. Peas. Squash. Tomatoes. Peppers. Radishes. ? Fruits: Apples. Apricots. Avocado. Berries. Bananas. Cherries. Dates. Figs. Grapes. Lemons. Melon. Oranges. Peaches. Plums. Pomegranate. ? Meats and other protein foods: Beans. Almonds. Sunflower seeds. Pine nuts. Peanuts. Cod. Salmon. Scallops. Shrimp. Tuna. Tilapia. Clams. Oysters. Eggs. ? Dairy: Low-fat milk. Cheese. Greek yogurt. ? Beverages: Water. Red wine. Herbal tea. ? Fats and oils: Extra virgin olive oil. Avocado oil. Grape seed oil. ? Sweets and desserts: [place] yogurt with honey. Baked apples. Poached pears. Trail mix. ? Seasoning and other foods: Basil. Cilantro. Coriander. Cumin. Mint. Parsley. Sage. Rosemary. Tarragon. Garlic. Oregano. Thyme. Pepper. Balsalmic vinegar. Tahini. Hummus. Tomato sauce. Olives. Mushrooms. ? Limit these ? Grains: Prepackaged pasta or rice dishes. Prepackaged cereal with added sugar. ? Vegetables: Deep fried potatoes (french fries). ? Fruits: Fruit canned in syrup. ? Meats and other protein foods: Beef. Pork. Lamb. Poultry with skin. Hot dogs. Tomasa Blase. ? Dairy: Ice cream. Sour cream. Whole milk. ? Beverages: Juice. Sugar-sweetened soft drinks. Beer. Liquor and spirits. ? Fats and oils: Butter. Canola oil. Vegetable oil. Beef fat (tallow). Lard. ? Sweets and desserts: Cookies. Cakes. Pies. Candy. ? Seasoning and other foods: Mayonnaise. Premade sauces and marinades. ? The items listed may not be a complete list. Talk with your dietitian about what dietary choices are right for you. Summary  The Mediterranean diet includes both food and lifestyle choices.  Eat a variety of fresh fruits and vegetables, beans, nuts, seeds, and whole grains.  Limit the amount of red meat and sweets that you eat.  Talk with your health care provider about whether it is safe  for you to drink red wine in moderation. This means 1 glass a day for nonpregnant women and 2 glasses a day for men. A glass of wine equals 5 oz (150 mL). This information is not intended to replace advice given to you by your health care provider. Make sure you discuss any questions you have with your health care provider. Document Released: 02/03/2016 Document Revised: 03/07/2016 Document Reviewed: 02/03/2016 Elsevier Interactive Patient Education  [employer].

## 2017-01-04 NOTE — Progress Notes (Signed)
NEUROLOGY CONSULTATION NOTE  Danny [name] MRN: [id number] DOB: 26-Mar-1961  Referring provider: Dr. Drue NovelPaz Primary care provider: Dr. Drue NovelPaz  Reason for consult:  TIA  HISTORY OF PRESENT ILLNESS: Danny [name] is a 56 year old right-handed male with paroxysmal atrial fibrillation, type 2 diabetes, hypertension, hyperlipidemia, paroxysmal atrial fibrillation, former smoker, BPH status post TUNA procedure, and elevated LFTs who presents for TIA.  He is accompanied by his wife who supplements history.  History is also supplemented by ED and PCP notes.  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  This was accompanied by blurred vision in the left eye, diaphoresis, brief palpitations, dizziness and confusion.  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    He denied headache, neck pain or radicular pain down the arm, slurred speech, facial droop, chest pain/pressure or unilateral numbness or weakness of the extremities.  He presented to the ED for further evaluation.  CT of head was personally reviewed and revealed no acute findings.  MRI of brain was personally reviewed and revealed no acute findings.  MRI of cervical spine was personally reviewed and revealed C3-4 borerline spinal stenosis with mild to moderate left foraminal stenosis and C5-6 left paracentral disc protrusion contacting the cord without compression but no significant findings.  After the MRI, he reported some left arm pain, but workup did not reveal any cardiac issues.  He has felt well since then.  He had another brief but mild episode of chin numbness later that month, lasting just a few minutes.  12/04/16 LABS:  HGB A1c 6.3; BMP with Na 136, K 3.9, Cl 102, CO2 26, glucose 134, BUN 18, Cr 0.93.  05/16/16 Lipid panel:  Total cholesterol 94, TG 168, HDL 33.10,  LDL 27  03/25/14 TTE:  Normal study  PAST MEDICAL HISTORY: Past Medical History:  Diagnosis Date  . Allergic rhinitis   . Benign prostatic hypertrophy    s/p TUNA procedure aprx 2006,  . Carpal tunnel syndrome 06/17/2009  . Diabetes mellitus    Type II dx 1999  . 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 ED (erectile dysfunction)    After TUNA  . Elevated LFTs 2010   (-) chronic hep  panel  . History of cardiovascular stress test 8/11   Negative  . Hyperlipidemia   . HYPERSOMNIA UNSPECIFIED 12/11/2007  . Hypertension   . INSOMNIA-SLEEP DISORDER-UNSPEC 04/12/2010  . Obesity   . PAF (paroxysmal atrial fibrillation) (HCC) 12/21/2009   a. Failed Multaq. b. Prev on flecainide. c. s/p afib ablation 2015.    PAST SURGICAL HISTORY: Past Surgical History:  Procedure Laterality Date  . ABLATION  03-24-14   PVI by Dr Johney FrameAllred  . ATRIAL FIBRILLATION ABLATION N/A 03/24/2014   Procedure: ATRIAL FIBRILLATION ABLATION;  Surgeon: Gardiner RhymeJames D Allred, MD;  Location: [facility] CATH LAB;  Service: Cardiovascular;  Laterality: N/A;  . CYSTOSCOPY  11-09   neg  @ urology, was rx Flomax  . PROSTATE SURGERY     TUNA  . TEE WITHOUT CARDIOVERSION N/A 03/23/2014   Procedure: TRANSESOPHAGEAL ECHOCARDIOGRAM (TEE);  Surgeon: Pricilla RifflePaula Ross V, MD;  [facility];  Service: Cardiovascular;  Laterality: N/A;  . VASECTOMY     several years ago    MEDICATIONS: Current Outpatient Prescriptions  on File Prior to Visit  Medication Sig Dispense Refill  . acetaminophen (TYLENOL) 650 MG CR tablet Take 650 mg by mouth every 8 (eight) hours as needed for pain.    Marland Kitchen azelastine (ASTELIN) 0.1 % nasal spray PLACE 2 SPRAYS INTO BOTH NOSTRILS EVERY NIGHT AT BEDTIME AS NEEDED FOR RHINITIS (Patient taking differently: PLACE 1 SPRAY  INTO BOTH NOSTRILS EVERY NIGHT AT BEDTIME AS NEEDED FOR RHINITIS) 30 mL 0  . Continuous Blood Gluc Sensor (FREESTYLE LIBRE SENSOR SYSTEM) MISC 1 strip by Does not apply route once  a week. Apply to upper arm and change sensor every 10 days 3 each 3  . ELIQUIS 5 MG TABS tablet TAKE 1 TABLET(5 MG) BY MOUTH TWICE DAILY (Patient taking differently: Take 5 mg by mouth two times a day) 60 tablet 5  . fish oil-omega-3 fatty acids 1000 MG capsule Take 2 g by mouth 2 (two) times daily.      . fluticasone (FLONASE) 50 MCG/ACT nasal spray Place 2 sprays into both nostrils See admin instructions. IN THE MORNING AS NEEDED FOR RHINITIS    . gabapentin (NEURONTIN) 600 MG tablet Take 600 mg by mouth 2 (two) times daily.    Marland Kitchen glucagon (GLUCAGON EMERGENCY) 1 MG injection Inject 1 mg into the vein once as needed. 1 each 5  . insulin aspart (NOVOLOG FLEXPEN) 100 UNIT/ML FlexPen Inject 5-10 Units into the skin 3 (three) times daily as needed for high blood sugar (IF BGL IS 100 OR GREATER).     . insulin degludec (TRESIBA FLEXTOUCH) 100 UNIT/ML SOPN FlexTouch Pen Inject 0.4 mLs (40 Units total) into the skin at bedtime. 3 mL 5  . Insulin Pen Needle (B-D ULTRAFINE III SHORT PEN) 31G X 8 MM MISC Use one per day 30 each 3  . Insulin Syringe-Needle U-100 (INSULIN SYRINGE .5CC/31GX5/16") 31G X 5/16" 0.5 ML MISC USE ONE SYRINGE AS DIRECTED THREE TO FOUR TIMES DAILY 100 each 3  . loratadine (CLARITIN) 10 MG tablet Take 10 mg by mouth daily.     Letta Pate VERIO test strip TEST BLOOD SUGAR FOUR TIMES DAILY 125 each 1  . Polyethyl Glyc-Propyl Glyc PF (SYSTANE ULTRA PF) 0.4-0.3 % SOLN Apply 2 drops to eye 3 (three) times daily as needed (for dry eyes).    . simvastatin (ZOCOR) 20 MG tablet Take 0.5 tablets (10 mg total) by mouth at bedtime. 15 tablet 5  . tamsulosin (FLOMAX) 0.4 MG CAPS capsule Take 1 capsule (0.4 mg total) by mouth daily. 30 capsule 3  . XIGDUO XR 10-998 MG TB24 TAKE 1 TABLET BY MOUTH TWICE DAILY -REPLACES  INVOKAMET  XR. 60 tablet 3   No current facility-administered medications on file prior to visit.     ALLERGIES: No Known Allergies  FAMILY HISTORY: Family History  Problem  Relation Age of Onset  . Heart disease Mother        M-- MI,CHF  . Diabetes Mother   . Hypertension Mother   . Hypertension Father   . Cancer Neg Hx        Negative FH of Colon or Prostate Cancer    SOCIAL HISTORY: Social History   Social History  . Marital status: Married    Spouse name: N/A  . Number of children: 3  . Years of education: GED   Occupational History  . Works for [employer] (state[employer]  Drives a Scientist, product/process development [employer]    works for [employer] (state)   Social History Main Topics  .  Smoking status: Former Smoker    Types: Cigarettes    Quit date: 06/26/1985  . Smokeless tobacco: Never Used  . Alcohol use No     Comment:    . Drug use: No  . Sexual activity: Not on file   Other Topics Concern  . Not on file   Social History Narrative   Lives with wife in a one story home.  Has 3 children.  Works for [employer].  Education: GED.             REVIEW OF SYSTEMS: Constitutional: No fevers, chills, or sweats, no generalized fatigue, change in appetite Eyes: No visual changes, double vision, eye pain Ear, nose and throat: No hearing loss, ear pain, nasal congestion, sore throat Cardiovascular: No chest pain, palpitations Respiratory:  No shortness of breath at rest or with exertion, wheezes GastrointestinaI: No nausea, vomiting, diarrhea, abdominal pain, fecal incontinence Genitourinary:  No dysuria, urinary retention or frequency Musculoskeletal:  No neck pain, back pain Integumentary: No rash, pruritus, skin lesions Neurological: as above Psychiatric: No depression, insomnia, anxiety Endocrine: No palpitations, fatigue, diaphoresis, mood swings, change in appetite, change in weight, increased thirst Hematologic/Lymphatic:  No purpura, petechiae. Allergic/Immunologic: no itchy/runny eyes, nasal congestion, recent allergic reactions, rashes  PHYSICAL EXAM: Vitals:   01/04/17 0953  BP: 110/80  Pulse: 76   General: No acute distress.  Patient appears well-groomed.  Head:   Normocephalic/atraumatic Eyes:  fundi examined but not visualized Neck: supple, no paraspinal tenderness, full range of motion Back: No paraspinal tenderness Heart: regular rate and rhythm Lungs: Clear to auscultation bilaterally. Vascular: No carotid bruits. Neurological Exam: Mental status: alert and oriented to person, place, and time, recent and remote memory intact, fund of knowledge intact, attention and concentration intact, speech fluent and not dysarthric, language intact. Cranial nerves: CN I: not tested CN II: pupils equal, round and reactive to light, visual fields intact CN III, IV, VI:  full range of motion, no nystagmus, no ptosis CN V: facial sensation intact CN VII: upper and lower face symmetric CN VIII: hearing intact CN IX, X: gag intact, uvula midline CN XI: sternocleidomastoid and trapezius muscles intact CN XII: tongue midline Bulk & Tone: normal, no fasciculations. Motor:  5/5 throughout  Sensation: temperature sensation intact and vibration sensation reduced in feet. Deep Tendon Reflexes:  2+ throughout, toes downgoing.  Finger to nose testing:  Without dysmetria.  Heel to shin:  Without dysmetria.  Gait:  Normal station and stride.  Able to turn and tandem walk. Romberg negative.  IMPRESSION: 1.  Possible transient ischemic attack.  Unusual presentation.  Symptoms not consistent with a cervical radiculopathy either. 2.  Paroxysmal atrial fibrillation 3.  Type 2 diabetes with polyneuropathy 4.  Hypertension 5.  Hyperlipidemia  PLAN: 1.  He is already on anticoagulation for his PAF 2.  Continue statin therapy.  He is at LDL goal of less than 70, but 27 is quite low.  Dr. Drue Novel may want to consider adjusting dose for LDL greater than 40. 3. Discussed exercise and Mediterranean diet 4.  Continue optimize glycemic and blood pressure control 5.  Check carotid doppler 6. I will not repeat an echocardiogram as it will not change management as he is already on  anticoagulation. 7.  Follow up in 3 months.  Thank you for allowing me to take part in the care of this patient.  Shon Millet, DO  CC:  [name], MD

## 2017-01-11 ENCOUNTER — Ambulatory Visit (HOSPITAL_COMMUNITY)
Admission: RE | Admit: 2017-01-11 | Discharge: 2017-01-11 | Disposition: A | Discharge status: Home / Self Care | Payer: BC Managed Care – PPO | Source: Ambulatory Visit | Attending: Cardiology | Admitting: Cardiology

## 2017-01-11 DIAGNOSIS — G459 Transient cerebral ischemic attack, unspecified: Secondary | ICD-10-CM | POA: Insufficient documentation

## 2017-01-11 DIAGNOSIS — I480 Paroxysmal atrial fibrillation: Secondary | ICD-10-CM | POA: Insufficient documentation

## 2017-01-15 ENCOUNTER — Telehealth: Payer: Self-pay | Admitting: *Deleted

## 2017-01-15 NOTE — Telephone Encounter (Signed)
-----   Message from Drema DallasAdam R Jaffe, DO sent at 01/15/2017  6:56 AM EDT ----- Carotid doppler looks okay

## 2017-01-15 NOTE — Telephone Encounter (Signed)
Patient notified

## 2017-01-16 ENCOUNTER — Other Ambulatory Visit: Payer: BC Managed Care – PPO

## 2017-01-19 ENCOUNTER — Ambulatory Visit: Payer: BC Managed Care – PPO | Admitting: Endocrinology

## 2017-02-02 ENCOUNTER — Other Ambulatory Visit: Payer: Self-pay | Admitting: Endocrinology

## 2017-02-11 ENCOUNTER — Other Ambulatory Visit: Payer: Self-pay | Admitting: Internal Medicine

## 2017-02-28 ENCOUNTER — Telehealth: Payer: Self-pay

## 2017-02-28 ENCOUNTER — Encounter: Payer: Self-pay | Admitting: Internal Medicine

## 2017-02-28 ENCOUNTER — Ambulatory Visit (INDEPENDENT_AMBULATORY_CARE_PROVIDER_SITE_OTHER): Payer: BC Managed Care – PPO | Admitting: Internal Medicine

## 2017-02-28 VITALS — BP 134/66 | HR 67 | Temp 98.2°F | Resp 14 | Ht 71.0 in | Wt 205.4 lb

## 2017-02-28 DIAGNOSIS — Z0000 Encounter for general adult medical examination without abnormal findings: Secondary | ICD-10-CM | POA: Diagnosis not present

## 2017-02-28 NOTE — Assessment & Plan Note (Addendum)
-  Tdap 2012, pnm shot 2015; prevnar 07-2015 -CCS:  Ha recommendd cscopes w/  Dr Chales AbrahamsGupta in [place] : 2009 hyperplastic polyps. Repeated colonoscopy 11-2012 rec a colonoscopy in 2024 -Prostate cancer screening, last DRE and PSA wnl. Reassess next year  -Labs: Will RTC fasting for a CMP and FLP -Diet-exercise - discussed

## 2017-02-28 NOTE — Progress Notes (Signed)
Subjective:    Patient ID: Danny [name], male    DOB: 21-Sep-1960, 56 y.o.   MRN: [id number]  DOS:  02/28/2017 Type of visit - description : cpx Interval history: Good med compliance   Review of Systems  Since the last visit, had TIA type of symptoms. Saw neurology, chart reviewed. In the last 2 months had 2 episodes of perioral numbness not associated with diplopia, facial weakness, headache or dizziness. Occasionally his vision gets blurred, at the L or R, sometimes bilaterally. He does check his blood sugars when he has those symptoms and is usually in the low 100s. Occasional aches and pains at the knees elbows and fingers. Has not seen any puffiness, redness or warmness at wrist or hand   Other than above, a 14 point review of systems is negative    Past Medical History:  Diagnosis Date  . Allergic rhinitis   . Benign prostatic hypertrophy    s/p TUNA procedure aprx 2006,  . Carpal tunnel syndrome 06/17/2009  . Diabetes mellitus    Type II dx 1999  . DM type 2 with diabetic peripheral neuropathy ([place]) 10/15/2014  . ED (erectile dysfunction)    After TUNA  . Elevated LFTs 2010   (-) chronic hep  panel  . History of cardiovascular stress test 8/11   Negative  . Hyperlipidemia   . HYPERSOMNIA UNSPECIFIED 12/11/2007  . Hypertension   . INSOMNIA-SLEEP DISORDER-UNSPEC 04/12/2010  . Obesity   . PAF (paroxysmal atrial fibrillation) ([place]) 12/21/2009   a. Failed Multaq. b. Prev on flecainide. c. s/p afib ablation 2015.    Past Surgical History:  Procedure Laterality Date  . ABLATION  03-24-14   PVI by Dr Rayann Heman  . ATRIAL FIBRILLATION ABLATION N/A 03/24/2014   Procedure: ATRIAL FIBRILLATION ABLATION;  Surgeon: Coralyn Mark, MD;  Location: [facility];  Service: Cardiovascular;  Laterality: N/A;  . CYSTOSCOPY  11-09   neg  @ urology, was rx Flomax  . PROSTATE SURGERY     TUNA  . TEE WITHOUT CARDIOVERSION N/A 03/23/2014   Procedure: TRANSESOPHAGEAL ECHOCARDIOGRAM (TEE);   Surgeon: Fay Records, MD;  Location: [facility];  Service: Cardiovascular;  Laterality: N/A;  . VASECTOMY     several years ago    Social History   Social History  . Marital status: Married    Spouse name: N/A  . Number of children: 3  . Years of education: GED   Occupational History  . Works for [employer] (state).  Drives a Hydrographic surveyor [employer]    works for [employer] (state)   Social History Main Topics  . Smoking status: Former Smoker    Types: Cigarettes    Quit date: 06/26/1985  . Smokeless tobacco: Never Used  . Alcohol use No     Comment:    . Drug use: No  . Sexual activity: Not on file   Other Topics Concern  . Not on file   Social History Narrative   Lives with wife in a one story home.  Has 3 children.  Works for [employer].  Education: GED.   Household- pt and wife              Family History  Problem Relation Age of Onset  . Heart disease Mother        M-- MI,CHF  . Diabetes Mother   . Hypertension Mother   . Hypertension Father   . Cancer Neg Hx  Negative FH of Colon or Prostate Cancer     Allergies as of 02/28/2017   No Known Allergies     Medication List       Accurate as of 02/28/17  3:50 PM. Always use your most recent med list.          acetaminophen 650 MG CR tablet Commonly known as:  TYLENOL Take 650 mg by mouth every 8 (eight) hours as needed for pain.   azelastine 0.1 % nasal spray Commonly known as:  ASTELIN PLACE 2 SPRAYS INTO BOTH NOSTRILS EVERY NIGHT AT BEDTIME AS NEEDED FOR RHINITIS   ELIQUIS 5 MG Tabs tablet Generic drug:  apixaban TAKE 1 TABLET(5 MG) BY MOUTH TWICE DAILY   fish oil-omega-3 fatty acids 1000 MG capsule Take 2 g by mouth 2 (two) times daily.   fluticasone 50 MCG/ACT nasal spray Commonly known as:  FLONASE Place 2 sprays into both nostrils See admin instructions. IN THE MORNING AS NEEDED FOR RHINITIS   FREESTYLE LIBRE SENSOR SYSTEM Misc 1 strip by Does not apply route once a week. Apply to upper arm and  change sensor every 10 days   gabapentin 600 MG tablet Commonly known as:  NEURONTIN Take 1 tablet (600 mg total) by mouth 2 (two) times daily.   glucagon 1 MG injection Commonly known as:  GLUCAGON EMERGENCY Inject 1 mg into the vein once as needed.   insulin degludec 100 UNIT/ML Sopn FlexTouch Pen Commonly known as:  TRESIBA FLEXTOUCH Inject 0.4 mLs (40 Units total) into the skin at bedtime.   Insulin Pen Needle 31G X 8 MM Misc Commonly known as:  B-D ULTRAFINE III SHORT PEN Use one per day   INSULIN SYRINGE .5CC/31GX5/16" 31G X 5/16" 0.5 ML Misc USE ONE SYRINGE AS DIRECTED THREE TO FOUR TIMES DAILY   loratadine 10 MG tablet Commonly known as:  CLARITIN Take 10 mg by mouth daily.   NOVOLOG FLEXPEN 100 UNIT/ML FlexPen Generic drug:  insulin aspart Inject 5-10 Units into the skin 3 (three) times daily as needed for high blood sugar (IF BGL IS 100 OR GREATER).   ONETOUCH VERIO test strip Generic drug:  glucose blood USE 1 STRIP TO CHECK GLUCOSE 4 TIMES DAILY   simvastatin 20 MG tablet Commonly known as:  ZOCOR Take 0.5 tablets (10 mg total) by mouth at bedtime.   SYSTANE ULTRA PF 0.4-0.3 % Soln Generic drug:  Polyethyl Glyc-Propyl Glyc PF Apply 2 drops to eye 3 (three) times daily as needed (for dry eyes).   tamsulosin 0.4 MG Caps capsule Commonly known as:  FLOMAX Take 1 capsule (0.4 mg total) by mouth daily.   XIGDUO XR 10-998 MG Tb24 Generic drug:  Dapagliflozin-Metformin HCl ER TAKE 1 TABLET BY MOUTH TWICE DAILY -REPLACES  INVOKAMET  XR.            Discharge Care Instructions        Start     Ordered   02/28/17 0000  Comp Met (CMET)     02/28/17 0834   02/28/17 0000  Lipid panel     02/28/17 0834         Objective:   Physical Exam BP 134/66 (BP Location: Left Arm, Patient Position: Sitting, Cuff Size: Small)   Pulse 67   Temp 98.2 F (36.8 C) (Oral)   Resp 14   Ht 5' 11" (1.803 m)   Wt 205 lb 6 oz (93.2 kg)   SpO2 98%   BMI 28.64 kg/m  General:   Well developed, well nourished . NAD.  Neck: No  thyromegaly  HEENT:  Normocephalic . Face symmetric, atraumatic Lungs:  CTA B Normal respiratory effort, no intercostal retractions, no accessory muscle use. Heart: RRR,  no murmur.  No pretibial edema bilaterally  Abdomen:  Not distended, soft, non-tender. No rebound or rigidity.  + Reducible umbilical hernia Skin: Exposed areas without rash. Not pale. Not jaundice Neurologic:  alert & oriented X3.  Speech normal, gait appropriate for age and unassisted Strength symmetric and appropriate for age.  Psych: Cognition and judgment appear intact.  Cooperative with normal attention span and concentration.  Behavior appropriate. No anxious or depressed appearing.    Assessment & Plan:   Assessment DM, w/ neuropathy, Dr Dwyane Dee HTN Hyperlipidemia Atrial fibrillation, 2011, s/p ablation, now sinus rhythm, anticoagulated BPH s/p TUNA ED S/P shingles, postherpetic neuralgia R foot TIA (?) 10-2016, saw neuro, Rx carotid US: L ICA 1-39% otherwise (-)  PLAN Here for CPX DM with neuropathy: per endo, on gabapentin. Has some visual disturbances, recommend to see the eye doctor regularly HTN: On no medication, BP today is very good. Hyperlipidemia: On simvastatin, last LDL 27, will recheck, consider decrease statins. TIA? Sxs were atypical, carotid ultrasound done few months ago essentially negative. Now he has perioral numbness and vision disturbances but not typical TIA symptoms. We'll see neurologist soon. RTC 6 months

## 2017-02-28 NOTE — Telephone Encounter (Signed)
ROI completed and faxed to Dr. Chales AbrahamsGupta- [phone]. ROI sent for scanning. Awaiting records.

## 2017-02-28 NOTE — Assessment & Plan Note (Addendum)
Here for CPX DM with neuropathy: per endo, on gabapentin. Has some visual disturbances, recommend to see the eye doctor regularly HTN: On no medication, BP today is very good. Hyperlipidemia: On simvastatin, last LDL 27, will recheck, consider decrease statins. TIA? Sxs were atypical, carotid ultrasound done few months ago essentially negative. Now he has perioral numbness and vision disturbances but not typical TIA symptoms. We'll see neurologist soon. RTC 6 months

## 2017-02-28 NOTE — Patient Instructions (Signed)
GO TO THE FRONT DESK Schedule your next appointment for a  Check up in 6 months   Schedule labs to be done within next week, fasting

## 2017-02-28 NOTE — Progress Notes (Signed)
Pre visit review using our clinic review tool, if applicable. No additional management support is needed unless otherwise documented below in the visit note. 

## 2017-03-05 ENCOUNTER — Other Ambulatory Visit (INDEPENDENT_AMBULATORY_CARE_PROVIDER_SITE_OTHER): Payer: BC Managed Care – PPO

## 2017-03-05 DIAGNOSIS — Z0000 Encounter for general adult medical examination without abnormal findings: Secondary | ICD-10-CM

## 2017-03-05 LAB — COMPREHENSIVE METABOLIC PANEL
ALK PHOS: 57 U/L (ref 39–117)
ALT: 28 U/L (ref 0–53)
AST: 18 U/L (ref 0–37)
Albumin: 4 g/dL (ref 3.5–5.2)
BUN: 13 mg/dL (ref 6–23)
CHLORIDE: 103 meq/L (ref 96–112)
CO2: 28 meq/L (ref 19–32)
Calcium: 9.2 mg/dL (ref 8.4–10.5)
Creatinine, Ser: 0.92 mg/dL (ref 0.40–1.50)
GFR: 89.76 mL/min (ref >60.00)
GLUCOSE: 106 mg/dL — AB (ref 70–99)
POTASSIUM: 4.3 meq/L (ref 3.5–5.1)
Sodium: 140 mEq/L (ref 135–145)
TOTAL PROTEIN: 6.7 g/dL (ref 6.0–8.3)
Total Bilirubin: 0.4 mg/dL (ref 0.2–1.2)

## 2017-03-05 LAB — LIPID PANEL
Cholesterol: 110 mg/dL (ref 0–200)
HDL: 32.8 mg/dL — AB (ref >39.00)
LDL CALC: 52 mg/dL (ref 0–99)
NONHDL: 77.3
Total CHOL/HDL Ratio: 3
Triglycerides: 126 mg/dL (ref 0.0–149.0)
VLDL: 25.2 mg/dL (ref 0.0–40.0)

## 2017-03-06 NOTE — Telephone Encounter (Signed)
Noted. Will inform PCP.

## 2017-03-06 NOTE — Telephone Encounter (Signed)
Second request for records faxed. Received fax confirmation.

## 2017-03-06 NOTE — Telephone Encounter (Signed)
Caller name: Victorino DikeJennifer  Relation to pt: Dr. Chales AbrahamsGupta  Call back number: [phone]    Reason for call:  records were faxed on 11/2015 and patient has not been seen since, any questions or concerns feel free to call [facility] from Dr. Chales AbrahamsGupta office

## 2017-03-08 NOTE — Telephone Encounter (Signed)
No additional records available.

## 2017-03-12 ENCOUNTER — Other Ambulatory Visit: Payer: BC Managed Care – PPO

## 2017-03-14 ENCOUNTER — Other Ambulatory Visit: Payer: BC Managed Care – PPO

## 2017-03-16 ENCOUNTER — Ambulatory Visit: Payer: BC Managed Care – PPO | Admitting: Endocrinology

## 2017-03-29 ENCOUNTER — Other Ambulatory Visit: Payer: Self-pay | Admitting: Endocrinology

## 2017-03-30 ENCOUNTER — Telehealth: Payer: Self-pay | Admitting: Endocrinology

## 2017-04-02 NOTE — Telephone Encounter (Signed)
I have refilled this medication.

## 2017-04-02 NOTE — Telephone Encounter (Signed)
Refill as is

## 2017-04-02 NOTE — Telephone Encounter (Signed)
Please advise if okay to fill? Last filled by Historical Provider. Is dosage correct if okay to fill? Next appt with you is on 04/20/2017

## 2017-04-10 ENCOUNTER — Ambulatory Visit (INDEPENDENT_AMBULATORY_CARE_PROVIDER_SITE_OTHER): Payer: BC Managed Care – PPO | Admitting: Neurology

## 2017-04-10 ENCOUNTER — Encounter: Payer: Self-pay | Admitting: Neurology

## 2017-04-10 VITALS — BP 132/76 | HR 68 | Ht 71.0 in | Wt 208.4 lb

## 2017-04-10 DIAGNOSIS — R200 Anesthesia of skin: Secondary | ICD-10-CM | POA: Diagnosis not present

## 2017-04-10 DIAGNOSIS — I480 Paroxysmal atrial fibrillation: Secondary | ICD-10-CM | POA: Diagnosis not present

## 2017-04-10 NOTE — Progress Notes (Signed)
NEUROLOGY FOLLOW UP OFFICE NOTE  Danny [name] [id number]  HISTORY OF PRESENT ILLNESS: Danny [name] is a 56 year old right-handed male with paroxysmal atrial fibrillation, type 2 diabetes, hypertension, hyperlipidemia, paroxysmal atrial fibrillation, former smoker, BPH status post TUNA procedure, and elevated LFTs who follows up for TIA.  He is accompanied by his wife who supplements history.    UPDATE: Carotid doppler from 01/11/17 demonstrated no hemodynamically significant ICA stenosis. Lipid panel from 03/05/17 demonstrated LDL of 52.  Since last visit, he reports recurrent episodes of left facial numbness traveling down the left side of his neck and across his chin, lasting just a couple of minutes.  There is associated palpitations (his heart feels like it skips a beat).  There is no associated headache.  He had an ablation for atrial fibrillation a couple of years ago.  He has no history of headaches or seizures.   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  This was accompanied by blurred vision in the left eye, diaphoresis, brief palpitations, dizziness and confusion.  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    He denied headache, neck pain or radicular pain down the arm, slurred speech, facial droop, chest pain/pressure or unilateral numbness or weakness of the extremities.  He presented to the ED for further evaluation.  CT of head revealed no acute findings.  MRI of brain revealed no acute findings.  MRI of cervical spine C3-4 borerline spinal stenosis with mild to moderate left foraminal stenosis and C5-6 left paracentral disc protrusion contacting the cord without compression but no significant findings.  After the MRI, he reported some left arm pain, but workup did not reveal any cardiac issues.      12/04/16 LABS:  HGB A1c 6.3  03/25/14 TTE:  Normal study  PAST MEDICAL HISTORY: Past Medical History:  Diagnosis Date  . Allergic rhinitis   . Benign prostatic hypertrophy    s/p TUNA procedure aprx 2006,  . Carpal tunnel syndrome 06/17/2009  . Diabetes mellitus    Type II dx 1999  . 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 ED (erectile dysfunction)    After TUNA  . Elevated LFTs 2010   (-) chronic hep  panel  . History of cardiovascular stress test 8/11   Negative  . Hyperlipidemia   . HYPERSOMNIA UNSPECIFIED 12/11/2007  . Hypertension   . INSOMNIA-SLEEP DISORDER-UNSPEC 04/12/2010  . Obesity   . PAF (paroxysmal atrial fibrillation) ([facility]) 12/21/2009   a. Failed Multaq. b. Prev on flecainide. c. s/p afib ablation 2015.    MEDICATIONS: Current Outpatient Prescriptions on File Prior to Visit  Medication Sig Dispense Refill  . acetaminophen (TYLENOL) 650 MG CR tablet Take 650 mg by mouth every 8 (eight) hours as needed for pain.    Marland Kitchen azelastine (ASTELIN) 0.1 % nasal spray PLACE 2 SPRAYS INTO BOTH NOSTRILS EVERY NIGHT AT BEDTIME AS NEEDED FOR RHINITIS (Patient taking differently: PLACE 1 SPRAY  INTO BOTH NOSTRILS EVERY NIGHT AT BEDTIME AS NEEDED FOR RHINITIS) 30 mL 0  . Continuous Blood Gluc Sensor (FREESTYLE LIBRE SENSOR SYSTEM) MISC 1 strip by Does not apply route once a week. Apply to upper arm and change sensor every 10 days 3 each 3  . ELIQUIS  5 MG TABS tablet TAKE 1 TABLET(5 MG) BY MOUTH TWICE DAILY 60 tablet 4  . fish oil-omega-3 fatty acids 1000 MG capsule Take 2 g by mouth 2 (two) times daily.      . fluticasone (FLONASE) 50 MCG/ACT nasal spray Place 2 sprays into both nostrils See admin instructions. IN THE MORNING AS NEEDED FOR RHINITIS    . gabapentin (NEURONTIN) 600 MG tablet Take 1 tablet (600 mg total) by mouth 2 (two) times daily. 60 tablet 1  . glucagon (GLUCAGON EMERGENCY) 1 MG injection Inject 1 mg into the vein once as needed. (Patient  not taking: Reported on 02/28/2017) 1 each 5  . insulin aspart (NOVOLOG FLEXPEN) 100 UNIT/ML FlexPen Inject 5-10 Units into the skin 3 (three) times daily as needed for high blood sugar (IF BGL IS 100 OR GREATER).     . insulin degludec (TRESIBA FLEXTOUCH) 100 UNIT/ML SOPN FlexTouch Pen Inject 0.4 mLs (40 Units total) into the skin at bedtime. 3 mL 5  . Insulin Pen Needle (B-D ULTRAFINE III SHORT PEN) 31G X 8 MM MISC Use one per day 30 each 3  . Insulin Syringe-Needle U-100 (INSULIN SYRINGE .5CC/31GX5/16") 31G X 5/16" 0.5 ML MISC USE ONE SYRINGE AS DIRECTED THREE TO FOUR TIMES DAILY 100 each 3  . loratadine (CLARITIN) 10 MG tablet Take 10 mg by mouth daily.     Marland Kitchen NOVOLOG FLEXPEN 100 UNIT/ML FlexPen INJECT 15 UNITS SUBCUTANEOUSLY THREE TIMES DAILY 15 pen 3  . ONETOUCH VERIO test strip USE 1 STRIP TO CHECK GLUCOSE 4 TIMES DAILY 125 each 1  . Polyethyl Glyc-Propyl Glyc PF (SYSTANE ULTRA PF) 0.4-0.3 % SOLN Apply 2 drops to eye 3 (three) times daily as needed (for dry eyes).    . simvastatin (ZOCOR) 20 MG tablet Take 0.5 tablets (10 mg total) by mouth at bedtime. 15 tablet 5  . tamsulosin (FLOMAX) 0.4 MG CAPS capsule Take 1 capsule (0.4 mg total) by mouth daily. 30 capsule 3  . XIGDUO XR 10-998 MG TB24 TAKE 1 TABLET BY MOUTH TWICE DAILY -REPLACES  INVOKAMET  XR. 60 tablet 3   No current facility-administered medications on file prior to visit.     ALLERGIES: No Known Allergies  FAMILY HISTORY: Family History  Problem Relation Age of Onset  . Heart disease Mother        M-- MI,CHF  . Diabetes Mother   . Hypertension Mother   . Hypertension Father   . Cancer Neg Hx        Negative FH of Colon or Prostate Cancer    SOCIAL HISTORY: Social History   Social History  . Marital status: Married    Spouse name: N/A  . Number of children: 3  . Years of education: GED   Occupational History  . Works for [employer] (state).  Drives a Scientist, product/process development [employer]    works for [employer] (state)   Social History  Main Topics  . Smoking status: Former Smoker    Types: Cigarettes    Quit date: 06/26/1985  . Smokeless tobacco: Never Used  . Alcohol use No     Comment:    . Drug use: No  . Sexual activity: Not on file   Other Topics Concern  . Not on file   Social History Narrative   Lives with wife in a one story home.  Has 3 children.  Works for [employer].  Education: GED.   Household- pt and wife  REVIEW OF SYSTEMS: Constitutional: No fevers, chills, or sweats, no generalized fatigue, change in appetite Eyes: No visual changes, double vision, eye pain Ear, nose and throat: No hearing loss, ear pain, nasal congestion, sore throat Cardiovascular: No chest pain, palpitations Respiratory:  No shortness of breath at rest or with exertion, wheezes GastrointestinaI: No nausea, vomiting, diarrhea, abdominal pain, fecal incontinence Genitourinary:  No dysuria, urinary retention or frequency Musculoskeletal:  No neck pain, back pain Integumentary: No rash, pruritus, skin lesions Neurological: as above Psychiatric: No depression, insomnia, anxiety Endocrine: No palpitations, fatigue, diaphoresis, mood swings, change in appetite, change in weight, increased thirst Hematologic/Lymphatic:  No purpura, petechiae. Allergic/Immunologic: no itchy/runny eyes, nasal congestion, recent allergic reactions, rashes  PHYSICAL EXAM: Vitals:   04/10/17 1336  BP: 132/76  Pulse: 68  SpO2: 97%   General: No acute distress.  Patient appears well-groomed.  normal body habitus. Head:  Normocephalic/atraumatic Eyes:  Fundi examined but not visualized Neck: supple, no paraspinal tenderness, full range of motion Heart:  Regular rate and rhythm Lungs:  Clear to auscultation bilaterally Back: No paraspinal tenderness Neurological Exam: alert and oriented to person, place, and time. Attention span and concentration intact, recent and remote memory intact, fund of knowledge intact.  Speech fluent and not  dysarthric, language intact.  CN II-XII intact. Bulk and tone normal, muscle strength 5/5 throughout.  Sensation to light touch, temperature and vibration intact.  Deep tendon reflexes 2+ throughout, toes downgoing.  Finger to nose and heel to shin testing intact.  Gait normal, Romberg negative.  IMPRESSION: 1.  Episodes of left facial numbness.  The fact that these are recurrent habitual spells makes TIA less likely.  I also do not suspect seizure or migraine.  He reports associated palpitations, which may be atrial fibrillation. 2.  Paroxysmal atrial fibrillation  PLAN: 1.  He has a follow up appointment with his cardiologist, Dr. Johney Frame, on 06/25/17.  I recommend that he make a sooner appointment to evaluate palpiations. 2.  He is already on Eliquis, which is secondary stroke prevention due to atrial fibrillation 3.  Continue statin therapy and management for glycemic and blood pressure control. 4.  Follow up in 6 months.  16 minutes spent face to face with patient, over 50% spent discussing management.  Shon Millet, DO  CC:  [name], MD  Hillis Range, MD

## 2017-04-10 NOTE — Patient Instructions (Signed)
I don't think the episodes of left facial numbness are mini strokes since it is the same symptom every time.  I would try to get a sooner appointment with Dr. Johney Frame, since you feel skips in heartbeat with these spells.  Follow up in 6 months.

## 2017-04-11 ENCOUNTER — Other Ambulatory Visit: Payer: Self-pay | Admitting: Internal Medicine

## 2017-04-18 ENCOUNTER — Other Ambulatory Visit: Payer: BC Managed Care – PPO

## 2017-04-20 ENCOUNTER — Ambulatory Visit: Payer: BC Managed Care – PPO | Admitting: Endocrinology

## 2017-04-23 ENCOUNTER — Other Ambulatory Visit: Payer: Self-pay | Admitting: Endocrinology

## 2017-04-23 ENCOUNTER — Other Ambulatory Visit: Payer: Self-pay | Admitting: Internal Medicine

## 2017-05-06 ENCOUNTER — Other Ambulatory Visit: Payer: Self-pay | Admitting: Internal Medicine

## 2017-05-08 LAB — HM DIABETES EYE EXAM

## 2017-05-14 ENCOUNTER — Other Ambulatory Visit (INDEPENDENT_AMBULATORY_CARE_PROVIDER_SITE_OTHER): Payer: BC Managed Care – PPO

## 2017-05-14 DIAGNOSIS — E1165 Type 2 diabetes mellitus with hyperglycemia: Secondary | ICD-10-CM | POA: Diagnosis not present

## 2017-05-14 DIAGNOSIS — Z794 Long term (current) use of insulin: Secondary | ICD-10-CM

## 2017-05-14 LAB — BASIC METABOLIC PANEL
BUN: 16 mg/dL (ref 6–23)
CHLORIDE: 105 meq/L (ref 96–112)
CO2: 29 mEq/L (ref 19–32)
Calcium: 9.3 mg/dL (ref 8.4–10.5)
Creatinine, Ser: 0.82 mg/dL (ref 0.40–1.50)
GFR: 102.44 mL/min (ref >60.00)
GLUCOSE: 131 mg/dL — AB (ref 70–99)
POTASSIUM: 4.5 meq/L (ref 3.5–5.1)
SODIUM: 139 meq/L (ref 135–145)

## 2017-05-14 LAB — HEMOGLOBIN A1C: Hgb A1c MFr Bld: 6.1 % (ref 4.6–6.5)

## 2017-05-16 ENCOUNTER — Ambulatory Visit: Payer: BC Managed Care – PPO | Admitting: Endocrinology

## 2017-05-16 ENCOUNTER — Encounter: Payer: Self-pay | Admitting: Endocrinology

## 2017-05-16 VITALS — BP 124/78 | HR 59 | Ht 71.0 in | Wt 212.8 lb

## 2017-05-16 DIAGNOSIS — Z794 Long term (current) use of insulin: Secondary | ICD-10-CM

## 2017-05-16 DIAGNOSIS — E1142 Type 2 diabetes mellitus with diabetic polyneuropathy: Secondary | ICD-10-CM

## 2017-05-16 DIAGNOSIS — E119 Type 2 diabetes mellitus without complications: Secondary | ICD-10-CM | POA: Diagnosis not present

## 2017-05-16 NOTE — Patient Instructions (Addendum)
34 Tresiba  Watch hi calorie foods  More sugars at bedtime

## 2017-05-16 NOTE — Progress Notes (Signed)
Patient ID: Danny [name], male   DOB: June 06, 1961, 56 y.o.   MRN: [id number]     Reason for Appointment: Followup for Type 2 Diabetes  Referring physician: Drue Novel  History of Present Illness:          Diagnosis: Type 2 diabetes mellitus, date of diagnosis: 1992        Past history:  He has had long-standing diabetes and previously was treated with metformin, Amaryl, Januvia and Actos He was taken off metformin because of fear of liver problems.  May have been taken taken off Actos because of swelling of his legs but was on 45 mg at that time Over the last 2 years at least his A1c has been consistently over 8% He was started on insulin in 2011 probably when his A1c was over 9% Initially was given Lantus and Humalog and subsequently switched to 70/30 twice a day for unknown reasons Has not taken Byetta or Victoza in the past He had been on 70/30 insulin initially for a few years Previously A1c was 8% or more consistently before starting Invokamet. He was switched from Lantus to [place] in 1/17  Recent history:   INSULIN regimen is described as:  [place] 32 units at 9 pm Novolog 8 am 10/12 ac bid-tid Oral hypoglycemic drugs the patient is taking are: Xigduo, 10/998, 2 tablets daily  His A1c has been usually in the upper normal range and about the same as usual at 6.1 now    Current management, blood sugar patterns and problems identified:  He says that he had to go up on his Evaristo Bury since his last visit because of being less active and having long work hours, has not been seen since 11/2016..  Again his blood sugars are the highest FASTING compared to lunch and supper time  Despite reminders he does not check readings after evening meal  Although he had been using the [employer] he has not been able to make it stick consistently and stopped using this because of the cost  Blood sugars are fairly consistent before lunch and supper and as before on an average of  the lowest before lunch  HYPOGLYCEMIA none documented  RECENTLY with lowest blood sugars 76  He has only a couple of readings after his evening meal which are fairly good  Again adjusting his NovoLog at suppertime based on his meal size although he thinks he feels better with checking blood sugar before taking insulin  He thinks he will start eating a little more active now with cutting back on working hours  He only takes insulin at lunchtime if blood sugars high before eating for fear of hypoglycemia  Continues to benefit from taking Xigduo           Side effects from medications have been: None  Compliance with the medical regimen: Good  Glucose monitoring:  done 3-4 times a day         Glucometer: One Touch Verio.      Blood Glucose readings  from download:  Mean values apply above for all meters except median for One Touch  PRE-MEAL Fasting Lunch Dinner Bedtime Overall  Glucose range: 94-1 66    92, 94  76-250   Mean/median: 132 103  102   111   Glycemic control:   Lab Results  Component Value Date   HGBA1C 6.1 05/14/2017   HGBA1C 6.3 12/04/2016   HGBA1C 6.0 08/18/2016   Lab Results  Component Value Date  MICROALBUR <0.7 08/18/2016   LDLCALC 52 03/05/2017   CREATININE 0.82 05/14/2017    Retinal exam: annual    Self-care:  Meals: 3 meals per day. Breakfast is eggs or cereal usually,  controlling portions      Dietician visit: Most recent: years ago.    Exercise:  he is recently active with moving equipment at work and some walking, does drive a truck                      Weight history:  Wt Readings from Last 3 Encounters:  05/16/17 212 lb 12.8 oz (96.5 kg)  04/10/17 208 lb 6.4 oz (94.5 kg)  02/28/17 205 lb 6 oz (93.2 kg)    Lab on 05/14/2017  Component Date Value Ref Range Status  . Sodium 05/14/2017 139  135 - 145 mEq/L Final  . Potassium 05/14/2017 4.5  3.5 - 5.1 mEq/L Final  . Chloride 05/14/2017 105  96 - 112 mEq/L Final  . CO2 05/14/2017 29   19 - 32 mEq/L Final  . Glucose, Bld 05/14/2017 131* 70 - 99 mg/dL Final  . BUN 73/22/0254 16  6 - 23 mg/dL Final  . Creatinine, Ser 05/14/2017 0.82  0.40 - 1.50 mg/dL Final  . Calcium 27/11/2374 9.3  8.4 - 10.5 mg/dL Final  . GFR 28/31/5176 102.44  >60.00 mL/min Final  . Hgb A1c MFr Bld 05/14/2017 6.1  4.6 - 6.5 % Final   Glycemic Control Guidelines for People with Diabetes:Non Diabetic:  <6%Goal of Therapy: <7%Additional Action Suggested:  >8%     Allergies as of 05/16/2017   No Known Allergies     Medication List        Accurate as of 05/16/17  9:11 AM. Always use your most recent med list.          acetaminophen 650 MG CR tablet Commonly known as:  TYLENOL Take 650 mg by mouth every 8 (eight) hours as needed for pain.   azelastine 0.1 % nasal spray Commonly known as:  ASTELIN PLACE 2 SPRAYS INTO BOTH NOSTRILS EVERY NIGHT AT BEDTIME AS NEEDED FOR RHINITIS   ELIQUIS 5 MG Tabs tablet Generic drug:  apixaban TAKE 1 TABLET(5 MG) BY MOUTH TWICE DAILY   fish oil-omega-3 fatty acids 1000 MG capsule Take 2 g by mouth 2 (two) times daily.   fluticasone 50 MCG/ACT nasal spray Commonly known as:  FLONASE Place 2 sprays daily as needed into both nostrils for allergies or rhinitis.   FREESTYLE LIBRE SENSOR SYSTEM Misc 1 strip by Does not apply route once a week. Apply to upper arm and change sensor every 10 days   gabapentin 600 MG tablet Commonly known as:  NEURONTIN Take 1 tablet (600 mg total) by mouth 2 (two) times daily.   glucagon 1 MG injection Commonly known as:  GLUCAGON EMERGENCY Inject 1 mg into the vein once as needed.   glucose blood test strip Commonly known as:  ONETOUCH VERIO Use to check blood sugar 4 times daily.   insulin degludec 100 UNIT/ML Sopn FlexTouch Pen Commonly known as:  TRESIBA FLEXTOUCH Inject 0.4 mLs (40 Units total) into the skin at bedtime.   Insulin Pen Needle 31G X 8 MM Misc Commonly known as:  B-D ULTRAFINE III SHORT PEN Use one  per day   INSULIN SYRINGE .5CC/31GX5/16" 31G X 5/16" 0.5 ML Misc USE ONE SYRINGE AS DIRECTED THREE TO FOUR TIMES DAILY   loratadine 10 MG tablet Commonly known as:  CLARITIN Take 10 mg by mouth daily.   NOVOLOG FLEXPEN 100 UNIT/ML FlexPen Generic drug:  insulin aspart INJECT 15 UNITS SUBCUTANEOUSLY THREE TIMES DAILY   simvastatin 20 MG tablet Commonly known as:  ZOCOR Take 0.5 tablets (10 mg total) at bedtime by mouth.   SYSTANE ULTRA PF 0.4-0.3 % Soln Generic drug:  Polyethyl Glyc-Propyl Glyc PF Apply 2 drops to eye 3 (three) times daily as needed (for dry eyes).   tamsulosin 0.4 MG Caps capsule Commonly known as:  FLOMAX Take 1 capsule (0.4 mg total) by mouth daily.   XIGDUO XR 10-998 MG Tb24 Generic drug:  Dapagliflozin-Metformin HCl ER TAKE 1 TABLET BY MOUTH TWICE DAILY -REPLACES  INVOKAMET  XR.       Allergies:  No Known Allergies  Past Medical History:  Diagnosis Date  . Allergic rhinitis   . Benign prostatic hypertrophy    s/p TUNA procedure aprx 2006,  . Carpal tunnel syndrome 06/17/2009  . Diabetes mellitus    Type II dx 1999  . 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 ED (erectile dysfunction)    After TUNA  . Elevated LFTs 2010   (-) chronic hep  panel  . History of cardiovascular stress test 8/11   Negative  . Hyperlipidemia   . HYPERSOMNIA UNSPECIFIED 12/11/2007  . Hypertension   . INSOMNIA-SLEEP DISORDER-UNSPEC 04/12/2010  . Obesity   . PAF (paroxysmal atrial fibrillation) (HCC) 12/21/2009   a. Failed Multaq. b. Prev on flecainide. c. s/p afib ablation 2015.    Past Surgical History:  Procedure Laterality Date  . ABLATION  03-24-14   PVI by Dr Johney Frame  . ATRIAL FIBRILLATION ABLATION N/A 03/24/2014   Procedure: ATRIAL FIBRILLATION ABLATION;  Surgeon: Gardiner Rhyme, MD;  Location: [facility];  Service: Cardiovascular;  Laterality: N/A;  . CYSTOSCOPY  11-09   neg  @ urology, was rx Flomax  . PROSTATE SURGERY     TUNA   . TEE WITHOUT CARDIOVERSION N/A 03/23/2014   Procedure: TRANSESOPHAGEAL ECHOCARDIOGRAM (TEE);  Surgeon: Pricilla Riffle, MD;  Location: [facility] ENDOSCOPY;  Service: Cardiovascular;  Laterality: N/A;  . VASECTOMY     several years ago    Family History  Problem Relation Age of Onset  . Heart disease Mother        M-- MI,CHF  . Diabetes Mother   . Hypertension Mother   . Hypertension Father   . Cancer Neg Hx        Negative FH of Colon or Prostate Cancer    Social History:  reports that he quit smoking about 31 years ago. His smoking use included cigarettes. he has never used smokeless tobacco. He reports that he does not drink alcohol or use drugs.    Review of Systems       Lipids: He has been on low dose simvastatin for a few years , Followed by PCP also Also taking fish oil      Lab Results  Component Value Date   CHOL 110 03/05/2017   HDL 32.80 (L) 03/05/2017   LDLCALC 52 03/05/2017   TRIG 126.0 03/05/2017   CHOLHDL 3 03/05/2017               No hypertension: His microalbumin has been normal, he is not on ACE inhibitor       For several years has a history of Numbness, tingling  in feet and lower legs  On gabapentin 2 times a day and getting  fair relief with this    LABS:  Lab on 05/14/2017  Component Date Value Ref Range Status  . Sodium 05/14/2017 139  135 - 145 mEq/L Final  . Potassium 05/14/2017 4.5  3.5 - 5.1 mEq/L Final  . Chloride 05/14/2017 105  96 - 112 mEq/L Final  . CO2 05/14/2017 29  19 - 32 mEq/L Final  . Glucose, Bld 05/14/2017 131* 70 - 99 mg/dL Final  . BUN 44/08/4740 16  6 - 23 mg/dL Final  . Creatinine, Ser 05/14/2017 0.82  0.40 - 1.50 mg/dL Final  . Calcium 59/56/3875 9.3  8.4 - 10.5 mg/dL Final  . GFR 64/33/2951 102.44  >60.00 mL/min Final  . Hgb A1c MFr Bld 05/14/2017 6.1  4.6 - 6.5 % Final   Glycemic Control Guidelines for People with Diabetes:Non Diabetic:  <6%Goal of Therapy: <7%Additional Action Suggested:  >8%     Physical  Examination:  BP 124/78   Pulse (!) 59   Ht 5\' 11"  (1.803 m)   Wt 212 lb 12.8 oz (96.5 kg)   SpO2 96%   BMI 29.68 kg/m   ASSESSMENT/PLAN:   Diabetic Foot Exam - Simple   Simple Foot Form Diabetic Foot exam was performed with the following findings:  Yes 05/16/2017  9:09 AM  Visual Inspection No deformities, no ulcerations, no other skin breakdown bilaterally:  Yes Sensation Testing Intact to touch and monofilament testing bilaterally:  Yes Pulse Check Posterior Tibialis and Dorsalis pulse intact bilaterally:  Yes Comments      Diabetes type 2  with BMI 29 on insulin See history of present illness for detailed discussion of current diabetes management, blood sugar patterns and problems identified  His A1c is fairly good and fairly consistent again just over 6% Although he has gained weight this is likely to be from his decreased level of activity and he thinks his diet is still fairly good Highest blood sugars are fasting although not clear if he has high readings after evening meal also or not With his increased activity with work he tends to have lower readings midday without hypoglycemia currently Also likely benefiting from [place] and renal function/potassium are stable However he should benefit better from using the freestyle Libre sensor to help monitor his postprandial readings both during the day and had not she is not doing now  Recommendations today: He will increase [place] by 2 units at least to get morning sugars back down close to 120 Discussed adjusting this dosage based on fasting glucose  He needs to use the arm band to hold his freestyle since her down from [place] He can use different sites for the sensor ; also if he is concerned about the expense he can use this only about once a month Discussed that he if he is getting high readings after lunch with more carbohydrate he needs to take some insulin at lunchtime also and not just at breakfast and  supper Meanwhile increased blood sugar monitoring after supper No change in oral medications  ?  Neuropathy: He has minimal symptoms and relieved by gabapentin as needed No sensory loss on exam today   Patient Instructions  34 Tresiba  Watch hi calorie foods  More sugars at bedtime   Counseling time on subjects discussed in assessment and plan sections is over 50% of today's 25 minute visit   Floreen Teegarden 05/16/2017, 9:11 AM   Note: This office note was prepared with Insurance underwriter. Any transcriptional errors that result from this  process are unintentional. .

## 2017-05-30 ENCOUNTER — Telehealth: Payer: Self-pay | Admitting: Internal Medicine

## 2017-05-30 NOTE — Telephone Encounter (Signed)
New message   Patient calling with concerns about HR fluctuation  No other symptoms. Please call    Patient c/o Palpitations:  High priority if patient c/o lightheadedness, shortness of breath, or chest pain  1) How long have you had palpitations/irregular HR/ Afib? Are you having the symptoms now? NO  2) Are you currently experiencing lightheadedness, SOB or CP? NO  3) Do you have a history of afib (atrial fibrillation) or irregular heart rhythm?  YES  4) Have you checked your BP or HR? (document readings if available): HR 79, 97, 121  5) Are you experiencing any other symptoms? No

## 2017-05-30 NOTE — Telephone Encounter (Signed)
Spoke with patient and he says he feels fine but is having fluctuating in his HR's.  His wife wants him to be sen sooner than 12/31.  I have moved his appointment up to 12/12 at 8:45.  He is aware and appreciative.

## 2017-05-31 ENCOUNTER — Encounter: Payer: Self-pay | Admitting: *Deleted

## 2017-06-02 ENCOUNTER — Other Ambulatory Visit: Payer: Self-pay | Admitting: Endocrinology

## 2017-06-06 ENCOUNTER — Ambulatory Visit: Payer: BC Managed Care – PPO | Admitting: Internal Medicine

## 2017-06-08 ENCOUNTER — Ambulatory Visit: Payer: BC Managed Care – PPO | Admitting: Internal Medicine

## 2017-06-08 VITALS — BP 128/72 | HR 64 | Ht 71.0 in | Wt 214.0 lb

## 2017-06-08 DIAGNOSIS — I480 Paroxysmal atrial fibrillation: Secondary | ICD-10-CM

## 2017-06-08 DIAGNOSIS — I10 Essential (primary) hypertension: Secondary | ICD-10-CM | POA: Diagnosis not present

## 2017-06-08 NOTE — Progress Notes (Signed)
PCP: Wanda PlumpPaz, Jose E, MD Primary Cardiologist: Dr Jens Somrenshaw Primary EP: Dr Johney FrameAllred  Danny GreenhouseFred W [name] is a 56 y.o. male who presents today for routine electrophysiology followup.  Since last being seen in our clinic, the patient reports doing very well.  He has Apple Watch v 3 and notes that his watch will occassionally say that his heart rate is elevated.  He denies any symptoms with this and notes that it only occurs for a few seconds.  He also says that his watch will alert him at night that his heart rate is "low".  He has been completely asymptomatic. Today, he denies symptoms of palpitations, chest pain, shortness of breath,  lower extremity edema, dizziness, presyncope, or syncope.  The patient is otherwise without complaint today.   Past Medical History:  Diagnosis Date  . Allergic rhinitis   . Benign prostatic hypertrophy    s/p TUNA procedure aprx 2006,  . Carpal tunnel syndrome 06/17/2009  . Diabetes mellitus    Type II dx 1999  . 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 ED (erectile dysfunction)    After TUNA  . Elevated LFTs 2010   (-) chronic hep  panel  . History of cardiovascular stress test 8/11   Negative  . Hyperlipidemia   . HYPERSOMNIA UNSPECIFIED 12/11/2007  . Hypertension   . INSOMNIA-SLEEP DISORDER-UNSPEC 04/12/2010  . Obesity   . PAF (paroxysmal atrial fibrillation) (HCC) 12/21/2009   a. Failed Multaq. b. Prev on flecainide. c. s/p afib ablation 2015.   Past Surgical History:  Procedure Laterality Date  . ABLATION  03-24-14   PVI by Dr Johney FrameAllred  . ATRIAL FIBRILLATION ABLATION N/A 03/24/2014   Procedure: ATRIAL FIBRILLATION ABLATION;  Surgeon: Gardiner RhymeJames D Yoneko Talerico, MD;  Location: [facility] CATH LAB;  Service: Cardiovascular;  Laterality: N/A;  . CYSTOSCOPY  11-09   neg  @ urology, was rx Flomax  . PROSTATE SURGERY     TUNA  . TEE WITHOUT CARDIOVERSION N/A 03/23/2014   Procedure: TRANSESOPHAGEAL ECHOCARDIOGRAM (TEE);  Surgeon: Pricilla RifflePaula Ross V, MD;   [facility];  Service: Cardiovascular;  Laterality: N/A;  . VASECTOMY     several years ago    ROS- all systems are reviewed and negatives except as per HPI above  Current Outpatient Medications  Medication Sig Dispense Refill  . acetaminophen (TYLENOL) 650 MG CR tablet Take 650 mg by mouth every 8 (eight) hours as needed for pain.    Marland Kitchen. azelastine (ASTELIN) 0.1 % nasal spray PLACE 2 SPRAYS INTO BOTH NOSTRILS EVERY NIGHT AT BEDTIME AS NEEDED FOR RHINITIS 30 mL 0  . Continuous Blood Gluc Sensor (FREESTYLE LIBRE SENSOR SYSTEM) MISC 1 strip by Does not apply route once a week. Apply to upper arm and change sensor every 10 days 3 each 3  . ELIQUIS 5 MG TABS tablet TAKE 1 TABLET(5 MG) BY MOUTH TWICE DAILY 60 tablet 4  . fish oil-omega-3 fatty acids 1000 MG capsule Take 2 g by mouth 2 (two) times daily.      . fluticasone (FLONASE) 50 MCG/ACT nasal spray Place 2 sprays daily as needed into both nostrils for allergies or rhinitis. 16 g 5  . gabapentin (NEURONTIN) 600 MG tablet Take 1 tablet (600 mg total) by mouth 2 (two) times daily. 60 tablet 5  . glucagon (GLUCAGON EMERGENCY) 1 MG injection Inject 1 mg into the vein once as needed. 1 each 5  . glucose blood (ONETOUCH VERIO) test strip Use to check  blood sugar 4 times daily. 125 each 1  . insulin degludec (TRESIBA FLEXTOUCH) 100 UNIT/ML SOPN FlexTouch Pen Inject 0.4 mLs (40 Units total) into the skin at bedtime. 3 mL 5  . Insulin Pen Needle (B-D ULTRAFINE III SHORT PEN) 31G X 8 MM MISC Use one per day 30 each 3  . Insulin Syringe-Needle U-100 (INSULIN SYRINGE .5CC/31GX5/16") 31G X 5/16" 0.5 ML MISC USE ONE SYRINGE AS DIRECTED THREE TO FOUR TIMES DAILY 100 each 3  . loratadine (CLARITIN) 10 MG tablet Take 10 mg by mouth daily.     Marland Kitchen. NOVOLOG FLEXPEN 100 UNIT/ML FlexPen INJECT 15 UNITS SUBCUTANEOUSLY THREE TIMES DAILY 15 pen 3  . ONETOUCH VERIO test strip USE 1 STRIP TO CHECK GLUCOSE 4 TIMES DAILY 150 each 1  . Polyethyl Glyc-Propyl Glyc PF  (SYSTANE ULTRA PF) 0.4-0.3 % SOLN Apply 2 drops to eye 3 (three) times daily as needed (for dry eyes).    . simvastatin (ZOCOR) 20 MG tablet Take 0.5 tablets (10 mg total) at bedtime by mouth. 15 tablet 5  . tamsulosin (FLOMAX) 0.4 MG CAPS capsule Take 1 capsule (0.4 mg total) by mouth daily. 30 capsule 5  . XIGDUO XR 10-998 MG TB24 TAKE 1 TABLET BY MOUTH TWICE DAILY -REPLACES  INVOKAMET  XR. 60 tablet 3   No current facility-administered medications for this visit.     Physical Exam: Vitals:   06/08/17 0931  BP: 128/72  Pulse: 64  SpO2: 96%  Weight: 214 lb (97.1 kg)  Height: 5\' 11"  (1.803 m)    GEN- The patient is well appearing, alert and oriented x 3 today.   Head- normocephalic, atraumatic Eyes-  Sclera clear, conjunctiva pink Ears- hearing intact Oropharynx- clear Lungs- Clear to ausculation bilaterally, normal work of breathing Heart- Regular rate and rhythm, no murmurs, rubs or gallops, PMI not laterally displaced GI- soft, NT, ND, + BS Extremities- no clubbing, cyanosis, or edema  EKG tracing ordered today is personally reviewed and shows sinus rhythm, normal ekg  Assessment and Plan:  1. Atrial fibrillation Doing well s/p ablation off AADs We discussed his Apple Watch findings.  We discussed false positive or artifact results and also that PACs, PVCs, nonsustained arrhythmias can be detected.  As he has no symptoms or persistantly elevated heart rates, I do not feel that any changes are advised. On eliquis for chads2vasc score of 2  2. HTN Stable No change required today   Return in a year  Hillis RangeJames Rieley Khalsa MD, [facility] CenterFACC 06/08/2017 10:05 AM

## 2017-06-08 NOTE — Patient Instructions (Signed)

## 2017-06-11 ENCOUNTER — Telehealth: Payer: Self-pay | Admitting: Endocrinology

## 2017-06-11 ENCOUNTER — Other Ambulatory Visit: Payer: Self-pay

## 2017-06-11 MED ORDER — INSULIN PEN NEEDLE 32G X 4 MM MISC: 3 refills | Status: DC

## 2017-06-11 MED ORDER — INSULIN DEGLUDEC 100 UNIT/ML ~~LOC~~ SOPN
40.0000 [IU] | PEN_INJECTOR | Freq: Every day | SUBCUTANEOUS | 5 refills | Status: DC
Start: 2017-06-11 — End: 2018-04-26

## 2017-06-11 MED ORDER — INSULIN ASPART 100 UNIT/ML FLEXPEN: PEN_INJECTOR | SUBCUTANEOUS | 3 refills | Status: DC

## 2017-06-11 MED ORDER — "INSULIN SYRINGE 31G X 5/16"" 0.5 ML MISC": 3 refills | Status: DC

## 2017-06-11 NOTE — Telephone Encounter (Signed)
Patient needs a script but I did not understand what he was asking for so he asked that the Nurse call him at ph# [phone]

## 2017-06-11 NOTE — Telephone Encounter (Signed)
I called patient and he needed refills on Tresiba, Novolog, pen needles, and insulin syringes. [facility] [place]

## 2017-06-25 ENCOUNTER — Ambulatory Visit: Payer: BC Managed Care – PPO | Admitting: Internal Medicine

## 2017-07-01 ENCOUNTER — Other Ambulatory Visit: Payer: Self-pay | Admitting: Internal Medicine

## 2017-07-02 NOTE — Telephone Encounter (Signed)
Eliquis 5mg  refill request received; pt is 57 yrs old, wt-97.1kg, Crea-0.82 on 05/14/17, last seen by Dr. Johney FrameAllred on 06/08/17; will send in refill to requested pharmacy.

## 2017-07-27 ENCOUNTER — Other Ambulatory Visit: Payer: Self-pay | Admitting: Endocrinology

## 2017-08-01 ENCOUNTER — Telehealth: Payer: Self-pay | Admitting: Internal Medicine

## 2017-08-01 NOTE — Telephone Encounter (Signed)
I have called the patient, he was last seen by Dr .Hillery JacksAllread 2 month ago. He has not had any chest pain or SOB. He does no need further cardiac workup prior to colonoscopy. Will defer to our clinical pharmacist regarding the duration of how long to hold eliquis prior to colonoscopy.   Ramond DialSigned, Davian Hanshaw PA Pager: [phone]

## 2017-08-01 NOTE — Telephone Encounter (Signed)
° °  [facility] Pre-operative Risk Assessment    Request for surgical clearance:  1. What type of surgery is being performed? Colonoscopy   2. When is this surgery scheduled? 08/07/17  3. What type of clearance is required (medical clearance vs. Pharmacy clearance to hold med vs. Both)? Pharmacy Braulio Conte (   4. Are there any medications that need to be held prior to surgery and how long? Stop Eliquis  5 days prior   Practice name and name of physician performing surgery? Dr. Casandra Doffing at [place] and [place]  5. What is your office phone and fax number? [phone] 6. Anesthesia type (None, local, MAC, general) ? Not Sure    Romana Juniper 08/01/2017, 4:21 PM  _________________________________________________________________   (provider comments below)

## 2017-08-01 NOTE — Telephone Encounter (Signed)
Patient with diagnosis of Afib on Eliquis for anticoagulation.    Procedure: colonoscopy Date of procedure: 08/07/17  CHADS2-VASc score of  2 (CHF, HTN, AGE, DM2, stroke/tia x 2, CAD, AGE, male)  CrCl 14035ml/min  Per office protocol, patient can hold Eliquis for 24 hours prior to procedure.

## 2017-08-02 NOTE — Telephone Encounter (Signed)
Clearance routed via Epic fax function  

## 2017-08-06 ENCOUNTER — Telehealth: Payer: Self-pay | Admitting: Cardiology

## 2017-08-06 ENCOUNTER — Telehealth: Payer: Self-pay | Admitting: Internal Medicine

## 2017-08-06 NOTE — Telephone Encounter (Signed)
Follow up   [name] wanting to know the status of the Surgical clearance by 3 pm and says that the pt has already stopped taking his Eliquis 4 days ago and she doesn't know why. Please call Fax: [phone]

## 2017-08-06 NOTE — Telephone Encounter (Signed)
Debbie [facility]( [facility]) is returning a call and is asking if you can call back before 3pm because she will have to cancel the patient appt for tomorrow . Thanks

## 2017-08-06 NOTE — Telephone Encounter (Signed)
Called and left message on Debbie's voicemail that I hard faxed info regarding Eliquis information from our pharmacist and if she had anymore questions to feel free to call our office.

## 2017-08-06 NOTE — Telephone Encounter (Signed)
New Message   Patient is calling in reference to cardiac clearance. He states that Dr. Jodean LimaMakan office indicates that they have not received. Please resend.

## 2017-08-07 LAB — HM COLONOSCOPY

## 2017-08-11 ENCOUNTER — Encounter: Payer: Self-pay | Admitting: Endocrinology

## 2017-08-21 ENCOUNTER — Encounter: Payer: Self-pay | Admitting: Internal Medicine

## 2017-08-28 ENCOUNTER — Encounter: Payer: Self-pay | Admitting: Internal Medicine

## 2017-08-28 ENCOUNTER — Ambulatory Visit: Payer: BC Managed Care – PPO | Admitting: Internal Medicine

## 2017-08-28 VITALS — BP 132/68 | HR 62 | Temp 98.2°F | Resp 14 | Ht 71.0 in | Wt 215.2 lb

## 2017-08-28 DIAGNOSIS — I10 Essential (primary) hypertension: Secondary | ICD-10-CM | POA: Diagnosis not present

## 2017-08-28 DIAGNOSIS — E1142 Type 2 diabetes mellitus with diabetic polyneuropathy: Secondary | ICD-10-CM | POA: Diagnosis not present

## 2017-08-28 DIAGNOSIS — I480 Paroxysmal atrial fibrillation: Secondary | ICD-10-CM | POA: Diagnosis not present

## 2017-08-28 LAB — BASIC METABOLIC PANEL
BUN: 18 mg/dL (ref 6–23)
CALCIUM: 9.6 mg/dL (ref 8.4–10.5)
CO2: 30 mEq/L (ref 19–32)
CREATININE: 0.86 mg/dL (ref 0.40–1.50)
Chloride: 102 mEq/L (ref 96–112)
GFR: 96.87 mL/min (ref >60.00)
Glucose, Bld: 114 mg/dL — ABNORMAL HIGH (ref 70–99)
Potassium: 4.2 mEq/L (ref 3.5–5.1)
Sodium: 138 mEq/L (ref 135–145)

## 2017-08-28 MED ORDER — GABAPENTIN 600 MG PO TABS: ORAL_TABLET | ORAL | 6 refills | Status: DC

## 2017-08-28 NOTE — Patient Instructions (Signed)
GO TO THE LAB : Get the blood work     GO TO THE FRONT DESK Schedule your next appointment for a physical exam by September 2019  For leg pain: Increase gabapentin 600 mg : 1 ablate in the morning, 2 tablets at bedtime  Also take Tylenol 500 mg: 1 or 2 tablets every night  Call if the leg pain continue

## 2017-08-28 NOTE — Progress Notes (Signed)
Subjective:    Patient ID: Danny [name], male    DOB: 1960/11/03, 57 y.o.   MRN: [id number]  DOS:  08/28/2017 Type of visit - description : Routine office visit Interval history: HTN: Good compliance of medication, normal ambulatory BPs Atrial fibrillation: Notes from cardiology reviewed TIA?  Note from neurology reviewed Also,  1 month history of left leg pain. The pain is from the left hip down to the left foot, described as a deep ache, last few hours, happening essentially every night. The limb if not swollen, red or warm.  No claudication. He does have some mild back pain not far from baseline.  Review of Systems Denies chest pain, difficulty breathing, palpitations No nausea, vomiting, diarrhea  Past Medical History:  Diagnosis Date  . Allergic rhinitis   . Benign prostatic hypertrophy    s/p TUNA procedure aprx 2006,  . Carpal tunnel syndrome 06/17/2009  . Diabetes mellitus    Type II dx 1999  . 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 ED (erectile dysfunction)    After TUNA  . Elevated LFTs 2010   (-) chronic hep  panel  . History of cardiovascular stress test 8/11   Negative  . Hyperlipidemia   . HYPERSOMNIA UNSPECIFIED 12/11/2007  . Hypertension   . INSOMNIA-SLEEP DISORDER-UNSPEC 04/12/2010  . Obesity   . PAF (paroxysmal atrial fibrillation) (HCC) 12/21/2009   a. Failed Multaq. b. Prev on flecainide. c. s/p afib ablation 2015.    Past Surgical History:  Procedure Laterality Date  . ABLATION  03-24-14   PVI by Dr Johney Frame  . ATRIAL FIBRILLATION ABLATION N/A 03/24/2014   Procedure: ATRIAL FIBRILLATION ABLATION;  Surgeon: Gardiner Rhyme, MD;  Location: [facility] CATH LAB;  Service: Cardiovascular;  Laterality: N/A;  . CYSTOSCOPY  11-09   neg  @ urology, was rx Flomax  . PROSTATE SURGERY     TUNA  . TEE WITHOUT CARDIOVERSION N/A 03/23/2014   Procedure: TRANSESOPHAGEAL ECHOCARDIOGRAM (TEE);  Surgeon: Pricilla Riffle, MD;  Location: [facility] ENDOSCOPY;   Service: Cardiovascular;  Laterality: N/A;  . VASECTOMY     several years ago    Social History   Socioeconomic History  . Marital status: Married    Spouse name: Not on file  . Number of children: 3  . Years of education: GED  . Highest education level: Not on file  Social Needs  . Financial resource strain: Not on file  . Food insecurity - worry: Not on file  . Food insecurity - inability: Not on file  . Transportation needs - medical: Not on file  . Transportation needs - non-medical: Not on file  Occupational History  . Occupation: Works for [employer] (state).  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[employer]    Comment: works for [employer] (state)  Tobacco Use  . Smoking status: Former Smoker    Types: Cigarettes    Last attempt to quit: 06/26/1985    Years since quitting: 32.1  . Smokeless tobacco: Never Used  Substance and Sexual Activity  . Alcohol use: No    Comment:    . Drug use: No  . Sexual activity: Not on file  Other Topics Concern  . Not on file  Social History Narrative   Lives with wife in a one story home.  Has 3 children.  Works for [employer].  Education: GED.   Household- pt and wife  Allergies as of 08/28/2017   No Known Allergies     Medication List        Accurate as of 08/28/17  6:07 PM. Always use your most recent med list.          acetaminophen 650 MG CR tablet Commonly known as:  TYLENOL Take 650 mg by mouth every 8 (eight) hours as needed for pain.   azelastine 0.1 % nasal spray Commonly known as:  ASTELIN PLACE 2 SPRAYS INTO BOTH NOSTRILS EVERY NIGHT AT BEDTIME AS NEEDED FOR RHINITIS   dicyclomine 10 MG capsule Commonly known as:  BENTYL Take 10 mg by mouth 4 (four) times daily -  before meals and at bedtime.   ELIQUIS 5 MG Tabs tablet Generic drug:  apixaban TAKE 1 TABLET(5 MG) BY MOUTH TWICE DAILY   fish oil-omega-3 fatty acids 1000 MG capsule Take 2 g by mouth 2 (two) times daily.   fluticasone 50 MCG/ACT nasal  spray Commonly known as:  FLONASE Place 2 sprays daily as needed into both nostrils for allergies or rhinitis.   FREESTYLE LIBRE SENSOR SYSTEM Misc 1 strip by Does not apply route once a week. Apply to upper arm and change sensor every 10 days   gabapentin 600 MG tablet Commonly known as:  NEURONTIN Take 1 tablet by mouth every morning and 2 tablets by mouth at bedtime   glucagon 1 MG injection Commonly known as:  GLUCAGON EMERGENCY Inject 1 mg into the vein once as needed.   glucose blood test strip Commonly known as:  ONETOUCH VERIO Use to check blood sugar 4 times daily.   ONETOUCH VERIO test strip Generic drug:  glucose blood USE 1 STRIP TO CHECK GLUCOSE 4 TIMES DAILY   insulin aspart 100 UNIT/ML FlexPen Commonly known as:  NOVOLOG FLEXPEN INJECT 15 UNITS SUBCUTANEOUSLY THREE TIMES DAILY   insulin degludec 100 UNIT/ML Sopn FlexTouch Pen Commonly known as:  TRESIBA FLEXTOUCH Inject 0.4 mLs (40 Units total) into the skin at bedtime.   Insulin Pen Needle 31G X 8 MM Misc Commonly known as:  B-D ULTRAFINE III SHORT PEN Use one per day   Insulin Pen Needle 32G X 4 MM Misc Commonly known as:  BD PEN NEEDLE NANO U/F Use to give insulin injects 4 times daily.   INSULIN SYRINGE .5CC/31GX5/16" 31G X 5/16" 0.5 ML Misc USE ONE SYRINGE AS DIRECTED THREE TO FOUR TIMES DAILY   loratadine 10 MG tablet Commonly known as:  CLARITIN Take 10 mg by mouth daily.   simvastatin 20 MG tablet Commonly known as:  ZOCOR Take 0.5 tablets (10 mg total) at bedtime by mouth.   SYSTANE ULTRA PF 0.4-0.3 % Soln Generic drug:  Polyethyl Glyc-Propyl Glyc PF Apply 2 drops to eye 3 (three) times daily as needed (for dry eyes).   tamsulosin 0.4 MG Caps capsule Commonly known as:  FLOMAX Take 1 capsule (0.4 mg total) by mouth daily.   XIGDUO XR 10-998 MG Tb24 Generic drug:  Dapagliflozin-metFORMIN HCl ER TAKE 1 TABLET BY MOUTH TWICE DAILY -REPLACES  INVOKAMET  XR.          Objective:    Physical Exam BP 132/68 (BP Location: Left Arm, Patient Position: Sitting, Cuff Size: Normal)   Pulse 62   Temp 98.2 F (36.8 C) (Oral)   Resp 14   Ht 5\' 11"  (1.803 m)   Wt 215 lb 4 oz (97.6 kg)   SpO2 97%   BMI 30.02 kg/m  General:   Well  developed, well nourished . NAD.  HEENT:  Normocephalic . Face symmetric, atraumatic Lungs:  CTA B Normal respiratory effort, no intercostal retractions, no accessory muscle use. Heart: RRR,  no murmur.  Lower extremities: No rash, normal femoral and pedal pulses.  No edema Skin: Not pale. Not jaundice Neurologic:  alert & oriented X3.  Speech normal, gait appropriate for age and unassisted MSK: Hip rotation bilaterally normal, no tender at the trochanteric bursa. Psych--  Cognition and judgment appear intact.  Cooperative with normal attention span and concentration.  Behavior appropriate. No anxious or depressed appearing.      Assessment & Plan:   Assessment DM, w/ neuropathy, Dr Lucianne MussKumar HTN Hyperlipidemia Atrial fibrillation, 2011, s/p ablation, now sinus rhythm, anticoagulated BPH s/p TUNA ED S/P shingles, postherpetic neuralgia R foot TIA (?) 10-2016, saw neuro, Rx carotid us: L ICA 1-39% otherwise (-)  PLAN DM: Per Endo, well-controlled on multiple meds, check a BMP HTN: Not on meds, check a BMP TIA?  Saw neurology 03-2018, neuro felt like sx were not likely TIA related.  Patient actually reports no further sx Atrial fibrillation anticoagulated, last visit with cardiology 05/2017, felt to be stable. Left leg pain: Nocturnal, normal MSK and vascular exam, etiology unclear, neuropathy related?:  Increase gabapentin from 600 mg B.I.D. to 600 in the morning and 2 at night.  Also Tylenol at bedtime.  Call if not better. RTC 02-2018 CPX

## 2017-08-28 NOTE — Assessment & Plan Note (Signed)
Had a colonoscopy 08/07/2017, no polyps, no colitis, Next 5 years (Dr. Fonnie Muaj Makam)

## 2017-08-28 NOTE — Assessment & Plan Note (Signed)
DM: Per Endo, well-controlled on multiple meds, check a BMP HTN: Not on meds, check a BMP TIA?  Saw neurology 03-2018, neuro felt like sx were not likely TIA related.  Patient actually reports no further sx Atrial fibrillation anticoagulated, last visit with cardiology 05/2017, felt to be stable. Left leg pain: Nocturnal, normal MSK and vascular exam, etiology unclear, neuropathy related?:  Increase gabapentin from 600 mg B.I.D. to 600 in the morning and 2 at night.  Also Tylenol at bedtime.  Call if not better. RTC 02-2018 CPX

## 2017-08-28 NOTE — Progress Notes (Signed)
Pre visit review using our clinic review tool, if applicable. No additional management support is needed unless otherwise documented below in the visit note. 

## 2017-09-10 ENCOUNTER — Other Ambulatory Visit (INDEPENDENT_AMBULATORY_CARE_PROVIDER_SITE_OTHER): Payer: BC Managed Care – PPO

## 2017-09-10 ENCOUNTER — Telehealth: Payer: Self-pay | Admitting: Internal Medicine

## 2017-09-10 DIAGNOSIS — Z794 Long term (current) use of insulin: Secondary | ICD-10-CM | POA: Diagnosis not present

## 2017-09-10 DIAGNOSIS — E119 Type 2 diabetes mellitus without complications: Secondary | ICD-10-CM

## 2017-09-10 LAB — COMPREHENSIVE METABOLIC PANEL
ALBUMIN: 4.2 g/dL (ref 3.5–5.2)
ALT: 31 U/L (ref 0–53)
AST: 17 U/L (ref 0–37)
Alkaline Phosphatase: 68 U/L (ref 39–117)
BILIRUBIN TOTAL: 0.4 mg/dL (ref 0.2–1.2)
BUN: 18 mg/dL (ref 6–23)
CALCIUM: 9.5 mg/dL (ref 8.4–10.5)
CO2: 29 mEq/L (ref 19–32)
CREATININE: 0.89 mg/dL (ref 0.40–1.50)
Chloride: 102 mEq/L (ref 96–112)
GFR: 93.1 mL/min (ref >60.00)
Glucose, Bld: 118 mg/dL — ABNORMAL HIGH (ref 70–99)
Potassium: 4.4 mEq/L (ref 3.5–5.1)
Sodium: 139 mEq/L (ref 135–145)
TOTAL PROTEIN: 7.2 g/dL (ref 6.0–8.3)

## 2017-09-10 LAB — MICROALBUMIN / CREATININE URINE RATIO
CREATININE, U: 18.5 mg/dL
MICROALB/CREAT RATIO: 3.8 mg/g (ref 0.0–30.0)

## 2017-09-10 LAB — HEMOGLOBIN A1C: HEMOGLOBIN A1C: 6.1 % (ref 4.6–6.5)

## 2017-09-10 NOTE — Telephone Encounter (Signed)
Copied from CRM [phone]. Topic: Quick Communication - See Telephone Encounter >> Sep 10, 2017  2:37 PM Eston Mouldavis, Griselda Bramblett B wrote: CRM for notification. See Telephone encounter for:  PT states gabapentin (NEURONTIN) 600 MG tablet is not helping and wants to know if he can get something else   [facility] [address] - [place], [place] - [address] [phone] (Phone) [phone] (Fax)

## 2017-09-11 MED ORDER — PREGABALIN 75 MG PO CAPS: ORAL_CAPSULE | ORAL | 3 refills | Status: DC

## 2017-09-11 NOTE — Telephone Encounter (Signed)
We can try something different. Stop gabapentin, start Lyrica 75 mg 1 or 2 tablets nightly.  #60, 1 refill. Advised patient to take only 1 tablet the first 10 days to see, if no side effects go ahead and increase to 2 tablets at night. If not better, will ask neuro to E eval.

## 2017-09-11 NOTE — Telephone Encounter (Signed)
Notified pt and he voices understanding. Rx printed and placed on PCP desk for signature tomorrow and pt aware Rx will be faxed on 09/12/17.

## 2017-09-12 NOTE — Telephone Encounter (Signed)
Rx faxed to [facility]  

## 2017-09-12 NOTE — Progress Notes (Signed)
Patient ID: Danny GreenhouseFred W [name], male   DOB: 1960/11/27, 57 y.o.   MRN: [id number]     Reason for Appointment: Followup for Type 2 Diabetes  Referring physician: Drue NovelPaz  History of Present Illness:          Diagnosis: Type 2 diabetes mellitus, date of diagnosis: 1992        Past history:  He has had long-standing diabetes and previously was treated with metformin, Amaryl, Januvia and Actos He was taken off metformin because of fear of liver problems.  May have been taken taken off Actos because of swelling of his legs but was on 45 mg at that time Over the last 2 years at least his A1c has been consistently over 8% He was started on insulin in 2011 probably when his A1c was over 9% Initially was given Lantus and Humalog and subsequently switched to 70/30 twice a day for unknown reasons Has not taken Byetta or Victoza in the past He had been on 70/30 insulin initially for a few years Previously A1c was 8% or more consistently before starting Invokamet. He was switched from Lantus to [place]iba in 1/17  Recent history:   INSULIN regimen is described as:  Tresiba 32 units at 9 pm Novolog 8 am 10/12 ac bid-tid Oral hypoglycemic drugs the patient is taking are: Xigduo, 10/998, 2 tablets daily  His A1c has been usually in the upper normal range and the same as usual at 6.1 now    Current management, blood sugar patterns and problems identified:  He has been checking his blood sugars with the One Touch meter because he did not pursue the freestyle Josephine IgoLibre  This is somewhat more expensive and he is also not able to keep it on consistently, even though he was told to get an arm band to hold the FreeStyle libre sensor on his arm he has not done so  Again checking blood sugar about 4 times a day  Has no change his insulin since last visit  As before blood sugars are the highest FASTING and averaging about 130  He has fairly good blood sugars before lunch and dinner but occasionally  higher after evening meal based on his diet  HYPOGLYCEMIA has been minimal and only occasionally will feel a little hypoglycemic treated with hard candy  Has been consistent with taking his oral medications also  He has adjusted his NovoLog at suppertime based on his meal size somewhat  He thinks he will start eating a little more active now with cutting back on working hours  He only takes insulin at lunchtime if blood sugars high before eating for fear of hypoglycemia      Side effects from medications have been: None  Compliance with the medical regimen: Good  Glucose monitoring:  done 3-4 times a day         Glucometer: One Touch Verio.      Blood Glucose readings  from download:  Mean values apply above for all meters except median for One Touch  PRE-MEAL Fasting Lunch Dinner Bedtime Overall  Glucose range:  101-157`     70-227  Mean/median:  133  97  101  104 107   POST-MEAL PC Breakfast PC Lunch PC Dinner  Glucose range:    83-162  Mean/median:    95   Previous readings:  Mean values apply above for all meters except median for One Touch  PRE-MEAL Fasting Lunch Dinner Bedtime Overall  Glucose range: 94-1 66  92, 94  76-250   Mean/median: 132 103  102   111   Glycemic control:   Lab Results  Component Value Date   HGBA1C 6.1 09/10/2017   HGBA1C 6.1 05/14/2017   HGBA1C 6.3 12/04/2016   Lab Results  Component Value Date   MICROALBUR <0.7 09/10/2017   LDLCALC 52 03/05/2017   CREATININE 0.89 09/10/2017    Retinal exam: annual    Self-care:  Meals: 3 meals per day. Breakfast is eggs or cereal usually,  controlling portions      Dietician visit: Most recent: years ago.    Exercise:  he is recently active with moving equipment at work and some walking, does drive a truck                      Weight history:  Wt Readings from Last 3 Encounters:  09/13/17 214 lb (97.1 kg)  08/28/17 215 lb 4 oz (97.6 kg)  06/08/17 214 lb (97.1 kg)    Lab on  09/10/2017  Component Date Value Ref Range Status  . Microalb, Ur 09/10/2017 <0.7  0.0 - 1.9 mg/dL Final  . Creatinine,U 16/03/9603 18.5  mg/dL Final  . Microalb Creat Ratio 09/10/2017 3.8  0.0 - 30.0 mg/g Final  . Sodium 09/10/2017 139  135 - 145 mEq/L Final  . Potassium 09/10/2017 4.4  3.5 - 5.1 mEq/L Final  . Chloride 09/10/2017 102  96 - 112 mEq/L Final  . CO2 09/10/2017 29  19 - 32 mEq/L Final  . Glucose, Bld 09/10/2017 118* 70 - 99 mg/dL Final  . BUN 54/02/8118 18  6 - 23 mg/dL Final  . Creatinine, Ser 09/10/2017 0.89  0.40 - 1.50 mg/dL Final  . Total Bilirubin 09/10/2017 0.4  0.2 - 1.2 mg/dL Final  . Alkaline Phosphatase 09/10/2017 68  39 - 117 U/L Final  . AST 09/10/2017 17  0 - 37 U/L Final  . ALT 09/10/2017 31  0 - 53 U/L Final  . Total Protein 09/10/2017 7.2  6.0 - 8.3 g/dL Final  . Albumin 14/78/2956 4.2  3.5 - 5.2 g/dL Final  . Calcium 21/30/8657 9.5  8.4 - 10.5 mg/dL Final  . GFR 84/69/6295 93.10  >60.00 mL/min Final  . Hgb A1c MFr Bld 09/10/2017 6.1  4.6 - 6.5 % Final   Glycemic Control Guidelines for People with Diabetes:Non Diabetic:  <6%Goal of Therapy: <7%Additional Action Suggested:  >8%     Allergies as of 09/13/2017   No Known Allergies     Medication List        Accurate as of 09/13/17 12:22 PM. Always use your most recent med list.          acetaminophen 650 MG CR tablet Commonly known as:  TYLENOL Take 650 mg by mouth every 8 (eight) hours as needed for pain.   azelastine 0.1 % nasal spray Commonly known as:  ASTELIN PLACE 2 SPRAYS INTO BOTH NOSTRILS EVERY NIGHT AT BEDTIME AS NEEDED FOR RHINITIS   dicyclomine 10 MG capsule Commonly known as:  BENTYL Take 10 mg by mouth 4 (four) times daily -  before meals and at bedtime.   ELIQUIS 5 MG Tabs tablet Generic drug:  apixaban TAKE 1 TABLET(5 MG) BY MOUTH TWICE DAILY   fish oil-omega-3 fatty acids 1000 MG capsule Take 2 g by mouth 2 (two) times daily.   fluticasone 50 MCG/ACT nasal  spray Commonly known as:  FLONASE Place 2 sprays daily as needed into both  nostrils for allergies or rhinitis.   FREESTYLE LIBRE SENSOR SYSTEM Misc 1 strip by Does not apply route once a week. Apply to upper arm and change sensor every 10 days   glucagon 1 MG injection Commonly known as:  GLUCAGON EMERGENCY Inject 1 mg into the vein once as needed.   glucose blood test strip Commonly known as:  ONETOUCH VERIO Use to check blood sugar 4 times daily.   ONETOUCH VERIO test strip Generic drug:  glucose blood USE 1 STRIP TO CHECK GLUCOSE 4 TIMES DAILY   insulin aspart 100 UNIT/ML FlexPen Commonly known as:  NOVOLOG FLEXPEN INJECT 15 UNITS SUBCUTANEOUSLY THREE TIMES DAILY   insulin degludec 100 UNIT/ML Sopn FlexTouch Pen Commonly known as:  TRESIBA FLEXTOUCH Inject 0.4 mLs (40 Units total) into the skin at bedtime.   Insulin Pen Needle 31G X 8 MM Misc Commonly known as:  B-D ULTRAFINE III SHORT PEN Use one per day   Insulin Pen Needle 32G X 4 MM Misc Commonly known as:  BD PEN NEEDLE NANO U/F Use to give insulin injects 4 times daily.   INSULIN SYRINGE .5CC/31GX5/16" 31G X 5/16" 0.5 ML Misc USE ONE SYRINGE AS DIRECTED THREE TO FOUR TIMES DAILY   loratadine 10 MG tablet Commonly known as:  CLARITIN Take 10 mg by mouth daily.   pregabalin 75 MG capsule Commonly known as:  LYRICA Take 1 tablet at bedtime for the first 10 days, may increase to 2 at bedtime if no improvement after 10 days   simvastatin 20 MG tablet Commonly known as:  ZOCOR Take 0.5 tablets (10 mg total) at bedtime by mouth.   SYSTANE ULTRA PF 0.4-0.3 % Soln Generic drug:  Polyethyl Glyc-Propyl Glyc PF Apply 2 drops to eye 3 (three) times daily as needed (for dry eyes).   tamsulosin 0.4 MG Caps capsule Commonly known as:  FLOMAX Take 1 capsule (0.4 mg total) by mouth daily.   XIGDUO XR 10-998 MG Tb24 Generic drug:  Dapagliflozin-metFORMIN HCl ER TAKE 1 TABLET BY MOUTH TWICE DAILY -REPLACES  INVOKAMET   XR.       Allergies:  No Known Allergies  Past Medical History:  Diagnosis Date  . Allergic rhinitis   . Benign prostatic hypertrophy    s/p TUNA procedure aprx 2006,  . Carpal tunnel syndrome 06/17/2009  . Diabetes mellitus    Type II dx 1999  . 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 ED (erectile dysfunction)    After TUNA  . Elevated LFTs 2010   (-) chronic hep  panel  . History of cardiovascular stress test 8/11   Negative  . Hyperlipidemia   . HYPERSOMNIA UNSPECIFIED 12/11/2007  . Hypertension   . INSOMNIA-SLEEP DISORDER-UNSPEC 04/12/2010  . Obesity   . PAF (paroxysmal atrial fibrillation) (HCC) 12/21/2009   a. Failed Multaq. b. Prev on flecainide. c. s/p afib ablation 2015.    Past Surgical History:  Procedure Laterality Date  . ABLATION  03-24-14   PVI by Dr Johney Frame  . ATRIAL FIBRILLATION ABLATION N/A 03/24/2014   Procedure: ATRIAL FIBRILLATION ABLATION;  Surgeon: Gardiner Rhyme, MD;  Location: [facility] CATH LAB;  Service: Cardiovascular;  Laterality: N/A;  . CYSTOSCOPY  11-09   neg  @ urology, was rx Flomax  . PROSTATE SURGERY     TUNA  . TEE WITHOUT CARDIOVERSION N/A 03/23/2014   Procedure: TRANSESOPHAGEAL ECHOCARDIOGRAM (TEE);  Surgeon: Pricilla Riffle, MD;  Location: [facility] ENDOSCOPY;  Service: Cardiovascular;  Laterality:  N/A;  . VASECTOMY     several years ago    Family History  Problem Relation Age of Onset  . Heart disease Mother   . Diabetes Mother   . Hypertension Mother   . Congestive Heart Failure Mother   . Heart attack Mother   . Hypertension Father   . Colon cancer Neg Hx   . Prostate cancer Neg Hx     Social History:  reports that he quit smoking about 32 years ago. His smoking use included cigarettes. He has never used smokeless tobacco. He reports that he does not drink alcohol or use drugs.    Review of Systems       Lipids: He has been on low dose simvastatin for a few years  Also taking fish oil      Lab Results   Component Value Date   CHOL 110 03/05/2017   HDL 32.80 (L) 03/05/2017   LDLCALC 52 03/05/2017   TRIG 126.0 03/05/2017   CHOLHDL 3 03/05/2017               No history of hypertension: His microalbumin has been normal, he is not on ACE inhibitor  BP Readings from Last 3 Encounters:  09/13/17 135/80  08/28/17 132/68  06/08/17 128/72           For several years has a history of Numbness, tingling  in feet and lower legs, not well relieved by gabapentin  On LYRICA from his PCP mostly at night and getting fair relief with this    LABS:  Lab on 09/10/2017  Component Date Value Ref Range Status  . Microalb, Ur 09/10/2017 <0.7  0.0 - 1.9 mg/dL Final  . Creatinine,U 16/03/9603 18.5  mg/dL Final  . Microalb Creat Ratio 09/10/2017 3.8  0.0 - 30.0 mg/g Final  . Sodium 09/10/2017 139  135 - 145 mEq/L Final  . Potassium 09/10/2017 4.4  3.5 - 5.1 mEq/L Final  . Chloride 09/10/2017 102  96 - 112 mEq/L Final  . CO2 09/10/2017 29  19 - 32 mEq/L Final  . Glucose, Bld 09/10/2017 118* 70 - 99 mg/dL Final  . BUN 54/02/8118 18  6 - 23 mg/dL Final  . Creatinine, Ser 09/10/2017 0.89  0.40 - 1.50 mg/dL Final  . Total Bilirubin 09/10/2017 0.4  0.2 - 1.2 mg/dL Final  . Alkaline Phosphatase 09/10/2017 68  39 - 117 U/L Final  . AST 09/10/2017 17  0 - 37 U/L Final  . ALT 09/10/2017 31  0 - 53 U/L Final  . Total Protein 09/10/2017 7.2  6.0 - 8.3 g/dL Final  . Albumin 14/78/2956 4.2  3.5 - 5.2 g/dL Final  . Calcium 21/30/8657 9.5  8.4 - 10.5 mg/dL Final  . GFR 84/69/6295 93.10  >60.00 mL/min Final  . Hgb A1c MFr Bld 09/10/2017 6.1  4.6 - 6.5 % Final   Glycemic Control Guidelines for People with Diabetes:Non Diabetic:  <6%Goal of Therapy: <7%Additional Action Suggested:  >8%     Physical Examination:  BP 135/80   Pulse 74   Wt 214 lb (97.1 kg)   SpO2 95%   BMI 29.85 kg/m   ASSESSMENT/PLAN:      Diabetes type 2  with BMI 29 on insulin  See history of present illness for detailed  discussion of current diabetes management, blood sugar patterns and problems identified  With taking basal bolus insulin and Xigduo his blood sugars are very well controlled A1c is again 6.1 Blood sugars are  fairly close to normal before lunch and dinner and only occasionally higher after supper based on his diet He tends to have high fasting readings, likely to be from down phenomenon However he has not been able to be as active or lose weight recently   Recommendations today:  He will try to have a snack with protein at bedtime such as yogurt since he is eating earlier in the evening and this may help his down phenomenon  No change in insulin as yet but he can start cutting back on Tresiba once he is more active and exercising  He will look into getting an arm band for his FreeStyle libre sensor and use it instead of the fingersticks as he finds this more convenient and useful also  Follow-up in 4 months   Hi protein snack at bedtime       Danny [name] 09/13/2017, 12:22 PM   Note: This office note was prepared with Dragon voice recognition system technology. Any transcriptional errors that result from this process are unintentional. .

## 2017-09-13 ENCOUNTER — Ambulatory Visit: Payer: BC Managed Care – PPO | Admitting: Endocrinology

## 2017-09-13 ENCOUNTER — Encounter: Payer: Self-pay | Admitting: Endocrinology

## 2017-09-13 VITALS — BP 135/80 | HR 74 | Wt 214.0 lb

## 2017-09-13 DIAGNOSIS — E1142 Type 2 diabetes mellitus with diabetic polyneuropathy: Secondary | ICD-10-CM

## 2017-09-13 NOTE — Patient Instructions (Signed)
Hi protein snack at bedtime

## 2017-10-04 ENCOUNTER — Ambulatory Visit: Payer: Self-pay

## 2017-10-04 NOTE — Telephone Encounter (Signed)
Patient called in with c/o "leg cramps."  He says "it's been going on a while, but last night was the worse. It was cramping during the night up into my groin. They are not cramping now, just sore. I am drinking gatorade like the doctor told me and I'm still having leg cramps." I asked about other symptoms, he says "no, just leg soreness from the cramps."  According to protocol, see PCP within 3 days, patient offered an appointment on Monday and Tuesday, he says "I have appointments next week, so I will call back to schedule the appointment."   Reason for Disposition . [1] MODERATE pain (e.g., interferes with normal activities, limping) AND [2] present > 3 days  Answer Assessment - Initial Assessment Questions 1. ONSET: "When did the pain start?"      Leg cramps going on for a while, last night the worse 2. LOCATION: "Where is the pain located?"      Legs, groin at night; legs sore now 3. PAIN: "How bad is the pain?"    (Scale 1-10; or mild, moderate, severe)   -  MILD (1-3): doesn't interfere with normal activities    -  MODERATE (4-7): interferes with normal activities (e.g., work or school) or awakens from sleep, limping    -  SEVERE (8-10): excruciating pain, unable to do any normal activities, unable to walk     4 soreness 4. WORK OR EXERCISE: "Has there been any recent work or exercise that involved this part of the body?"      No 5. CAUSE: "What do you think is causing the leg pain?"     I don't know 6. OTHER SYMPTOMS: "Do you have any other symptoms?" (e.g., chest pain, back pain, breathing difficulty, swelling, rash, fever, numbness, weakness)     No 7. PREGNANCY: "Is there any chance you are pregnant?" "When was your last menstrual period?"     N/A  Protocols used: LEG PAIN-A-AH

## 2017-10-05 ENCOUNTER — Encounter: Payer: Self-pay | Admitting: Gastroenterology

## 2017-10-06 ENCOUNTER — Other Ambulatory Visit: Payer: Self-pay | Admitting: Endocrinology

## 2017-10-09 ENCOUNTER — Ambulatory Visit: Payer: BC Managed Care – PPO | Admitting: Neurology

## 2017-10-14 ENCOUNTER — Other Ambulatory Visit: Payer: Self-pay | Admitting: Internal Medicine

## 2017-10-19 ENCOUNTER — Encounter: Payer: Self-pay | Admitting: Neurology

## 2017-10-21 ENCOUNTER — Other Ambulatory Visit: Payer: Self-pay | Admitting: Internal Medicine

## 2017-11-26 ENCOUNTER — Other Ambulatory Visit: Payer: Self-pay | Admitting: Endocrinology

## 2018-01-05 ENCOUNTER — Other Ambulatory Visit: Payer: Self-pay | Admitting: Endocrinology

## 2018-01-11 ENCOUNTER — Other Ambulatory Visit (INDEPENDENT_AMBULATORY_CARE_PROVIDER_SITE_OTHER): Payer: BC Managed Care – PPO

## 2018-01-11 DIAGNOSIS — E1142 Type 2 diabetes mellitus with diabetic polyneuropathy: Secondary | ICD-10-CM

## 2018-01-11 LAB — COMPREHENSIVE METABOLIC PANEL
ALBUMIN: 4.1 g/dL (ref 3.5–5.2)
ALK PHOS: 72 U/L (ref 39–117)
ALT: 42 U/L (ref 0–53)
AST: 23 U/L (ref 0–37)
BUN: 18 mg/dL (ref 6–23)
CHLORIDE: 102 meq/L (ref 96–112)
CO2: 30 mEq/L (ref 19–32)
Calcium: 9.2 mg/dL (ref 8.4–10.5)
Creatinine, Ser: 1 mg/dL (ref 0.40–1.50)
GFR: 81.29 mL/min (ref >60.00)
Glucose, Bld: 99 mg/dL (ref 70–99)
POTASSIUM: 4.2 meq/L (ref 3.5–5.1)
Sodium: 138 mEq/L (ref 135–145)
TOTAL PROTEIN: 7 g/dL (ref 6.0–8.3)
Total Bilirubin: 0.4 mg/dL (ref 0.2–1.2)

## 2018-01-11 LAB — HEMOGLOBIN A1C: HEMOGLOBIN A1C: 6.4 % (ref 4.6–6.5)

## 2018-01-14 ENCOUNTER — Other Ambulatory Visit: Payer: Self-pay

## 2018-01-14 MED ORDER — PREGABALIN 75 MG PO CAPS: ORAL_CAPSULE | ORAL | 3 refills | Status: DC

## 2018-01-14 NOTE — Progress Notes (Signed)
Patient ID: Danny [name], male   DOB: 10/17/1960, 57 y.o.   MRN: [id number]     Reason for Appointment: Followup for Type 2 Diabetes  Referring physician: Drue Novel  History of Present Illness:          Diagnosis: Type 2 diabetes mellitus, date of diagnosis: 1992        Past history:  He has had long-standing diabetes and previously was treated with metformin, Amaryl, Januvia and Actos He was taken off metformin because of fear of liver problems.  May have been taken taken off Actos because of swelling of his legs but was on 45 mg at that time Over the last 2 years at least his A1c has been consistently over 8% He was started on insulin in 2011 probably when his A1c was over 9% Initially was given Lantus and Humalog and subsequently switched to 70/30 twice a day for unknown reasons Has not taken Byetta or Victoza in the past He had been on 70/30 insulin initially for a few years Previously A1c was 8% or more consistently before starting Invokamet. He was switched from Lantus to [place] in 1/17  Recent history:   INSULIN regimen is described as:  Tresiba 32 units at 9 pm Novolog 8 am 10/12 ac bid-tid  Oral hypoglycemic drugs the patient is taking are: Xigduo, 10/998, 1 tablet twice a day  His A1c has been usually in the upper normal range and about the same as usual at 6.4, previously 6.1    Current management, blood sugar patterns and problems identified:  He apparently has not been eating as much NovoLog now because of his increased activity this summer  Even in the evening after supper he is fairly active and he frequently will not take any Humalog especially if the blood sugar is near normal  With this his blood sugars have been fairly consistent throughout the day with only occasional readings up to about 166 around lunchtime or bedtime  LOWEST blood sugar before suppertime after being active all day  FASTING blood sugars are somewhat variable but still  averaging around 120 with the same dose of TRESIBA  He cannot afford the freestyle libre sensor and not using this for the time being  He does however check his blood sugar about 4 times a day  Usually fairly cautious with his diet test for his high fat or high carbohydrate foods  Weight is stable  HYPOGLYCEMIA: None recently with his minimizing NovoLog           Side effects from medications have been: None  Compliance with the medical regimen: Good  Glucose monitoring:  done 3-4 times a day         Glucometer: One Touch Verio.      Blood Glucose readings  from download:  Mean values apply above for all meters except median for One Touch  PRE-MEAL Fasting Lunch Dinner Bedtime Overall  Glucose range:  92-146   72-150  81-164   Mean/median:  122  117  97  119 117     Glycemic control:   Lab Results  Component Value Date   HGBA1C 6.4 01/11/2018   HGBA1C 6.1 09/10/2017   HGBA1C 6.1 05/14/2017   Lab Results  Component Value Date   MICROALBUR <0.7 09/10/2017   LDLCALC 52 03/05/2017   CREATININE 1.00 01/11/2018    Retinal exam: annual    Self-care:  Meals: 3 meals per day. Breakfast is eggs or cereal usually,  controlling portions      Dietician visit: Most recent: years ago.    Exercise:  he is recently active with moving equipment at work and some walking, does drive a truck                      Weight history:  Wt Readings from Last 3 Encounters:  01/15/18 213 lb 6.4 oz (96.8 kg)  09/13/17 214 lb (97.1 kg)  08/28/17 215 lb 4 oz (97.6 kg)    Lab on 01/11/2018  Component Date Value Ref Range Status  . Sodium 01/11/2018 138  135 - 145 mEq/L Final  . Potassium 01/11/2018 4.2  3.5 - 5.1 mEq/L Final  . Chloride 01/11/2018 102  96 - 112 mEq/L Final  . CO2 01/11/2018 30  19 - 32 mEq/L Final  . Glucose, Bld 01/11/2018 99  70 - 99 mg/dL Final  . BUN 09/81/1914 18  6 - 23 mg/dL Final  . Creatinine, Ser 01/11/2018 1.00  0.40 - 1.50 mg/dL Final  . Total Bilirubin  01/11/2018 0.4  0.2 - 1.2 mg/dL Final  . Alkaline Phosphatase 01/11/2018 72  39 - 117 U/L Final  . AST 01/11/2018 23  0 - 37 U/L Final  . ALT 01/11/2018 42  0 - 53 U/L Final  . Total Protein 01/11/2018 7.0  6.0 - 8.3 g/dL Final  . Albumin 78/29/5621 4.1  3.5 - 5.2 g/dL Final  . Calcium 30/86/5784 9.2  8.4 - 10.5 mg/dL Final  . GFR 69/62/9528 81.29  >60.00 mL/min Final  . Hgb A1c MFr Bld 01/11/2018 6.4  4.6 - 6.5 % Final   Glycemic Control Guidelines for People with Diabetes:Non Diabetic:  <6%Goal of Therapy: <7%Additional Action Suggested:  >8%     Allergies as of 01/15/2018   No Known Allergies     Medication List        Accurate as of 01/15/18  8:13 AM. Always use your most recent med list.          acetaminophen 650 MG CR tablet Commonly known as:  TYLENOL Take 650 mg by mouth every 8 (eight) hours as needed for pain.   azelastine 0.1 % nasal spray Commonly known as:  ASTELIN PLACE 2 SPRAYS INTO BOTH NOSTRILS EVERY NIGHT AT BEDTIME AS NEEDED FOR RHINITIS   dicyclomine 10 MG capsule Commonly known as:  BENTYL Take 10 mg by mouth 4 (four) times daily -  before meals and at bedtime.   ELIQUIS 5 MG Tabs tablet Generic drug:  apixaban TAKE 1 TABLET(5 MG) BY MOUTH TWICE DAILY   fish oil-omega-3 fatty acids 1000 MG capsule Take 2 g by mouth 2 (two) times daily.   fluticasone 50 MCG/ACT nasal spray Commonly known as:  FLONASE Place 2 sprays into both nostrils daily as needed for allergies or rhinitis.   FREESTYLE LIBRE SENSOR SYSTEM Misc 1 strip by Does not apply route once a week. Apply to upper arm and change sensor every 10 days   glucagon 1 MG injection Commonly known as:  GLUCAGON EMERGENCY Inject 1 mg into the vein once as needed.   insulin aspart 100 UNIT/ML FlexPen Commonly known as:  NOVOLOG FLEXPEN INJECT 15 UNITS SUBCUTANEOUSLY THREE TIMES DAILY   insulin degludec 100 UNIT/ML Sopn FlexTouch Pen Commonly known as:  TRESIBA FLEXTOUCH Inject 0.4 mLs (40  Units total) into the skin at bedtime.   Insulin Pen Needle 31G X 8 MM Misc Commonly known as:  B-D ULTRAFINE III  SHORT PEN Use one per day   Insulin Pen Needle 32G X 4 MM Misc Commonly known as:  BD PEN NEEDLE NANO U/F Use to give insulin injects 4 times daily.   INSULIN SYRINGE .5CC/31GX5/16" 31G X 5/16" 0.5 ML Misc USE ONE SYRINGE AS DIRECTED THREE TO FOUR TIMES DAILY   loratadine 10 MG tablet Commonly known as:  CLARITIN Take 10 mg by mouth daily.   ONETOUCH VERIO test strip Generic drug:  glucose blood USE 1 STRIP TO CHECK GLUCOSE 4 TIMES DAILY   ONETOUCH VERIO test strip Generic drug:  glucose blood USE 1 STRIP TO CHECK GLUCOSE 4 TIMES DAILY   pregabalin 75 MG capsule Commonly known as:  LYRICA Take 1 tablet at bedtime for the first 10 days, may increase to 2 at bedtime if no improvement after 10 days   simvastatin 20 MG tablet Commonly known as:  ZOCOR Take 0.5 tablets (10 mg total) by mouth at bedtime.   SYSTANE ULTRA PF 0.4-0.3 % Soln Generic drug:  Polyethyl Glyc-Propyl Glyc PF Apply 2 drops to eye 3 (three) times daily as needed (for dry eyes).   tamsulosin 0.4 MG Caps capsule Commonly known as:  FLOMAX Take 1 capsule (0.4 mg total) by mouth daily.   XIGDUO XR 10-998 MG Tb24 Generic drug:  Dapagliflozin-metFORMIN HCl ER TAKE 1 TABLET BY MOUTH TWICE DAILY -REPLACES  INVOKAMET  XR.       Allergies:  No Known Allergies  Past Medical History:  Diagnosis Date  . Allergic rhinitis   . Benign prostatic hypertrophy    s/p TUNA procedure aprx 2006,  . Carpal tunnel syndrome 06/17/2009  . Diabetes mellitus    Type II dx 1999  . 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 ED (erectile dysfunction)    After TUNA  . Elevated LFTs 2010   (-) chronic hep  panel  . History of cardiovascular stress test 8/11   Negative  . Hyperlipidemia   . HYPERSOMNIA UNSPECIFIED 12/11/2007  . Hypertension   . INSOMNIA-SLEEP DISORDER-UNSPEC  04/12/2010  . Obesity   . PAF (paroxysmal atrial fibrillation) (HCC) 12/21/2009   a. Failed Multaq. b. Prev on flecainide. c. s/p afib ablation 2015.    Past Surgical History:  Procedure Laterality Date  . ABLATION  03-24-14   PVI by Dr Johney FrameAllred  . ATRIAL FIBRILLATION ABLATION N/A 03/24/2014   Procedure: ATRIAL FIBRILLATION ABLATION;  Surgeon: Gardiner RhymeJames D Allred, MD;  Location: [facility];  Service: Cardiovascular;  Laterality: N/A;  . CYSTOSCOPY  11-09   neg  @ urology, was rx Flomax  . PROSTATE SURGERY     TUNA  . TEE WITHOUT CARDIOVERSION N/A 03/23/2014   Procedure: TRANSESOPHAGEAL ECHOCARDIOGRAM (TEE);  Surgeon: Pricilla RifflePaula Ross V, MD;  [facility];  Service: Cardiovascular;  Laterality: N/A;  . VASECTOMY     several years ago    Family History  Problem Relation Age of Onset  . Heart disease Mother   . Diabetes Mother   . Hypertension Mother   . Congestive Heart Failure Mother   . Heart attack Mother   . Hypertension Father   . Colon cancer Neg Hx   . Prostate cancer Neg Hx     Social History:  reports that he quit smoking about 32 years ago. His smoking use included cigarettes. He has never used smokeless tobacco. He reports that he does not drink alcohol or use drugs.    Review of Systems  Lipids: He has been on low dose simvastatin for a few years, Followed by PCP also On his own is taking fish oil      Lab Results  Component Value Date   CHOL 110 03/05/2017   HDL 32.80 (L) 03/05/2017   LDLCALC 52 03/05/2017   TRIG 126.0 03/05/2017   CHOLHDL 3 03/05/2017               No hypertension: His microalbumin has been normal, he is not on ACE inhibitor  BP Readings from Last 3 Encounters:  01/15/18 132/88  09/13/17 135/80  08/28/17 132/68          For several years has a history of Numbness, tingling  in feet and lower legs He says that he was not getting relief of his leg pains with gabapentin and is doing much better with Lyrica prescribed by his  PCP     LABS:  Lab on 01/11/2018  Component Date Value Ref Range Status  . Sodium 01/11/2018 138  135 - 145 mEq/L Final  . Potassium 01/11/2018 4.2  3.5 - 5.1 mEq/L Final  . Chloride 01/11/2018 102  96 - 112 mEq/L Final  . CO2 01/11/2018 30  19 - 32 mEq/L Final  . Glucose, Bld 01/11/2018 99  70 - 99 mg/dL Final  . BUN 16/03/9603 18  6 - 23 mg/dL Final  . Creatinine, Ser 01/11/2018 1.00  0.40 - 1.50 mg/dL Final  . Total Bilirubin 01/11/2018 0.4  0.2 - 1.2 mg/dL Final  . Alkaline Phosphatase 01/11/2018 72  39 - 117 U/L Final  . AST 01/11/2018 23  0 - 37 U/L Final  . ALT 01/11/2018 42  0 - 53 U/L Final  . Total Protein 01/11/2018 7.0  6.0 - 8.3 g/dL Final  . Albumin 54/02/8118 4.1  3.5 - 5.2 g/dL Final  . Calcium 14/78/2956 9.2  8.4 - 10.5 mg/dL Final  . GFR 21/30/8657 81.29  >60.00 mL/min Final  . Hgb A1c MFr Bld 01/11/2018 6.4  4.6 - 6.5 % Final   Glycemic Control Guidelines for People with Diabetes:Non Diabetic:  <6%Goal of Therapy: <7%Additional Action Suggested:  >8%     Physical Examination:  BP 132/88 (BP Location: Left Arm, Patient Position: Sitting, Cuff Size: Normal)   Pulse 65   Ht 5\' 11"  (1.803 m)   Wt 213 lb 6.4 oz (96.8 kg)   SpO2 99%   BMI 29.76 kg/m   ASSESSMENT/PLAN:     Diabetes type 2  with BMI 29 on insulin See history of present illness for detailed discussion of current diabetes management, blood sugar patterns and problems identified  His A1c is fairly good and fairly consistent again just over 6%  This is now with mostly basal insulin along with the Xigduo  He does very well when he is active as this allows him to keep his blood sugars even without frequently not even taking mealtime coverage As usual his fasting readings are slightly high but blood sugars are low normal at times at lunch and dinnertime Postprandial readings are not averaging more than about 120-125 at any given time also  Recommendations today: No change in [place] He can  continue to take NovoLog based on activity level, meal size and pre-meal blood sugar No change in oral medications but he can try taking the Xigduo both tablets at dinnertime  LIPIDS to be checked by his PCP   There are no Patient Instructions on file for this visit.  Reather Littler 01/15/2018, 8:13 AM   Note: This office note was prepared with Dragon voice recognition system technology. Any transcriptional errors that result from this process are unintentional. .

## 2018-01-14 NOTE — Telephone Encounter (Signed)
Pt is requesting refill on Lyrica. Danny Pt.   Last OV: 08/28/2017 Last Fill: 09/11/2017 #60 and 3RF  Please advise.

## 2018-01-15 ENCOUNTER — Encounter: Payer: Self-pay | Admitting: Emergency Medicine

## 2018-01-15 ENCOUNTER — Ambulatory Visit (INDEPENDENT_AMBULATORY_CARE_PROVIDER_SITE_OTHER): Payer: BC Managed Care – PPO | Admitting: Endocrinology

## 2018-01-15 ENCOUNTER — Encounter: Payer: Self-pay | Admitting: Endocrinology

## 2018-01-15 VITALS — BP 138/82 | HR 65 | Ht 71.0 in | Wt 213.4 lb

## 2018-01-15 DIAGNOSIS — Z794 Long term (current) use of insulin: Secondary | ICD-10-CM | POA: Diagnosis not present

## 2018-01-15 DIAGNOSIS — E119 Type 2 diabetes mellitus without complications: Secondary | ICD-10-CM

## 2018-01-21 ENCOUNTER — Ambulatory Visit: Payer: BC Managed Care – PPO | Admitting: Neurology

## 2018-02-09 ENCOUNTER — Other Ambulatory Visit: Payer: Self-pay | Admitting: Family Medicine

## 2018-02-15 ENCOUNTER — Other Ambulatory Visit: Payer: Self-pay | Admitting: Endocrinology

## 2018-02-15 NOTE — Telephone Encounter (Addendum)
Pt called back to inquire about this Rx. I called [facility] for the pt, and they did have refills for him.  They had put no refills on his bottle. So nothing further needed.  They are getting Rx ready for the pt.

## 2018-02-19 NOTE — Telephone Encounter (Signed)
[facility].  Spoke with pharmacist, Noreene LarssonJill. She states they have 2 remaining refills on this rx for pt and they just used one and pt has already picked it up.

## 2018-03-06 ENCOUNTER — Encounter: Payer: Self-pay | Admitting: Internal Medicine

## 2018-03-06 ENCOUNTER — Ambulatory Visit (INDEPENDENT_AMBULATORY_CARE_PROVIDER_SITE_OTHER): Payer: BC Managed Care – PPO | Admitting: Internal Medicine

## 2018-03-06 VITALS — BP 130/78 | HR 64 | Temp 98.7°F | Resp 16 | Ht 71.0 in | Wt 216.4 lb

## 2018-03-06 DIAGNOSIS — Z0000 Encounter for general adult medical examination without abnormal findings: Secondary | ICD-10-CM | POA: Diagnosis not present

## 2018-03-06 DIAGNOSIS — Z125 Encounter for screening for malignant neoplasm of prostate: Secondary | ICD-10-CM

## 2018-03-06 DIAGNOSIS — Z23 Encounter for immunization: Secondary | ICD-10-CM | POA: Diagnosis not present

## 2018-03-06 LAB — LIPID PANEL
CHOLESTEROL: 116 mg/dL (ref 0–200)
HDL: 37.8 mg/dL — ABNORMAL LOW (ref >39.00)
NonHDL: 77.9
TRIGLYCERIDES: 246 mg/dL — AB (ref 0.0–149.0)
Total CHOL/HDL Ratio: 3
VLDL: 49.2 mg/dL — AB (ref 0.0–40.0)

## 2018-03-06 LAB — CBC WITH DIFFERENTIAL/PLATELET
BASOS ABS: 0 10*3/uL (ref 0.0–0.1)
Basophils Relative: 0.7 % (ref 0.0–3.0)
EOS ABS: 0.4 10*3/uL (ref 0.0–0.7)
Eosinophils Relative: 6.9 % — ABNORMAL HIGH (ref 0.0–5.0)
HEMATOCRIT: 48.5 % (ref 39.0–52.0)
HEMOGLOBIN: 16.2 g/dL (ref 13.0–17.0)
Lymphocytes Relative: 30.2 % (ref 12.0–46.0)
Lymphs Abs: 1.9 10*3/uL (ref 0.7–4.0)
MCHC: 33.5 g/dL (ref 30.0–36.0)
MCV: 82.7 fl (ref 78.0–100.0)
MONO ABS: 0.6 10*3/uL (ref 0.1–1.0)
Monocytes Relative: 8.8 % (ref 3.0–12.0)
Neutro Abs: 3.4 10*3/uL (ref 1.4–7.7)
Neutrophils Relative %: 53.4 % (ref 43.0–77.0)
Platelets: 245 10*3/uL (ref 150.0–400.0)
RBC: 5.87 Mil/uL — AB (ref 4.22–5.81)
RDW: 14.4 % (ref 11.5–15.5)
WBC: 6.3 10*3/uL (ref 4.0–10.5)

## 2018-03-06 LAB — LDL CHOLESTEROL, DIRECT: LDL DIRECT: 68 mg/dL

## 2018-03-06 LAB — PSA: PSA: 0.51 ng/mL (ref 0.10–4.00)

## 2018-03-06 NOTE — Progress Notes (Signed)
Subjective:    Patient ID: Danny [name], male    DOB: 08-12-1960, 57 y.o.   MRN: [id number]  DOS:  03/06/2018 Type of visit - description : cpx Interval history: Feels well, good med compliance.   Review of Systems Feeling well, specifically denies chest pain, palpitations, LUTS.  Other than above, a 14 point review of systems is negative    Past Medical History:  Diagnosis Date  . Allergic rhinitis   . Benign prostatic hypertrophy    s/p TUNA procedure aprx 2006,  . Carpal tunnel syndrome 06/17/2009  . Diabetes mellitus    Type II dx 1999  . 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 ED (erectile dysfunction)    After TUNA  . Elevated LFTs 2010   (-) chronic hep  panel  . History of cardiovascular stress test 8/11   Negative  . Hyperlipidemia   . HYPERSOMNIA UNSPECIFIED 12/11/2007  . Hypertension   . Obesity   . PAF (paroxysmal atrial fibrillation) (HCC) 12/21/2009   a. Failed Multaq. b. Prev on flecainide. c. s/p afib ablation 2015.    Past Surgical History:  Procedure Laterality Date  . ABLATION  03-24-14   PVI by Dr Johney Frame  . ATRIAL FIBRILLATION ABLATION N/A 03/24/2014   Procedure: ATRIAL FIBRILLATION ABLATION;  Surgeon: Gardiner Rhyme, MD;  Location: [facility];  Service: Cardiovascular;  Laterality: N/A;  . CYSTOSCOPY  11-09   neg  @ urology, was rx Flomax  . PROSTATE SURGERY     TUNA  . TEE WITHOUT CARDIOVERSION N/A 03/23/2014   Procedure: TRANSESOPHAGEAL ECHOCARDIOGRAM (TEE);  Surgeon: Pricilla Riffle, MD;  Location: [facility] ENDOSCOPY;  Service: Cardiovascular;  Laterality: N/A;  . VASECTOMY     several years ago    Social History   Socioeconomic History  . Marital status: Married    Spouse name: Not on file  . Number of children: 3  . Years of education: GED  . Highest education level: Not on file  Occupational History  . Occupation: Works for [employer] (state).  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[employer]    Comment: works for [employer]  (state)  Social Needs  . Financial resource strain: Not on file  . Food insecurity:    Worry: Not on file    Inability: Not on file  . Transportation needs:    Medical: Not on file    Non-medical: Not on file  Tobacco Use  . Smoking status: Former Smoker    Types: Cigarettes    Last attempt to quit: 06/26/1985    Years since quitting: 32.7  . Smokeless tobacco: Never Used  Substance and Sexual Activity  . Alcohol use: No    Comment:    . Drug use: No  . Sexual activity: Not on file  Lifestyle  . Physical activity:    Days per week: Not on file    Minutes per session: Not on file  . Stress: Not on file  Relationships  . Social connections:    Talks on phone: Not on file    Gets together: Not on file    Attends religious service: Not on file    Active member of club or organization: Not on file    Attends meetings of clubs or organizations: Not on file    Relationship status: Not on file  . Intimate partner violence:    Fear of current or ex partner: Not on file  Emotionally abused: Not on file    Physically abused: Not on file    Forced sexual activity: Not on file  Other Topics Concern  . Not on file  Social History Narrative   Lives with wife in a one story home.  Has 3 children.  Works for [employer].  Education: GED.   Household- pt and wife           Family History  Problem Relation Age of Onset  . Heart disease Mother   . Diabetes Mother   . Hypertension Mother   . Congestive Heart Failure Mother   . Heart attack Mother   . Hypertension Father   . Colon cancer Neg Hx   . Prostate cancer Neg Hx     Allergies as of 03/06/2018   No Known Allergies     Medication List        Accurate as of 03/06/18 11:59 PM. Always use your most recent med list.          acetaminophen 650 MG CR tablet Commonly known as:  TYLENOL Take 650 mg by mouth every 8 (eight) hours as needed for pain.   azelastine 0.1 % nasal spray Commonly known as:  ASTELIN PLACE 2 SPRAYS  INTO BOTH NOSTRILS EVERY NIGHT AT BEDTIME AS NEEDED FOR RHINITIS   dicyclomine 10 MG capsule Commonly known as:  BENTYL Take 10 mg by mouth 4 (four) times daily -  before meals and at bedtime.   ELIQUIS 5 MG Tabs tablet Generic drug:  apixaban TAKE 1 TABLET(5 MG) BY MOUTH TWICE DAILY   fish oil-omega-3 fatty acids 1000 MG capsule Take 2 g by mouth 2 (two) times daily.   fluticasone 50 MCG/ACT nasal spray Commonly known as:  FLONASE Place 2 sprays into both nostrils daily as needed for allergies or rhinitis.   glucagon 1 MG injection Inject 1 mg into the vein once as needed.   insulin aspart 100 UNIT/ML FlexPen Commonly known as:  NOVOLOG INJECT 15 UNITS SUBCUTANEOUSLY THREE TIMES DAILY   insulin degludec 100 UNIT/ML Sopn FlexTouch Pen Commonly known as:  TRESIBA Inject 0.4 mLs (40 Units total) into the skin at bedtime.   Insulin Pen Needle 31G X 8 MM Misc Use one per day   Insulin Pen Needle 32G X 4 MM Misc Use to give insulin injects 4 times daily.   INSULIN SYRINGE .5CC/31GX5/16" 31G X 5/16" 0.5 ML Misc USE ONE SYRINGE AS DIRECTED THREE TO FOUR TIMES DAILY   loratadine 10 MG tablet Commonly known as:  CLARITIN Take 10 mg by mouth daily.   ONETOUCH VERIO test strip Generic drug:  glucose blood USE 1 STRIP TO CHECK GLUCOSE 4 TIMES DAILY   ONETOUCH VERIO test strip Generic drug:  glucose blood USE 1 STRIP TO CHECK GLUCOSE 4 TIMES DAILY   pregabalin 75 MG capsule Commonly known as:  LYRICA Take 150 mg by mouth at bedtime as needed.   simvastatin 20 MG tablet Commonly known as:  ZOCOR Take 0.5 tablets (10 mg total) by mouth at bedtime.   SYSTANE ULTRA PF 0.4-0.3 % Soln Generic drug:  Polyethyl Glyc-Propyl Glyc PF Apply 2 drops to eye 3 (three) times daily as needed (for dry eyes).   tamsulosin 0.4 MG Caps capsule Commonly known as:  FLOMAX Take 1 capsule (0.4 mg total) by mouth daily.   XIGDUO XR 10-998 MG Tb24 Generic drug:  Dapagliflozin-metFORMIN HCl  ER TAKE 1 TABLET BY MOUTH TWICE DAILY -REPLACES  INVOKAMET  XR.  Objective:   Physical Exam BP 130/78 (BP Location: Left Arm, Patient Position: Sitting, Cuff Size: Large)   Pulse 64   Temp 98.7 F (37.1 C) (Oral)   Resp 16   Ht 5\' 11"  (1.803 m)   Wt 216 lb 6.4 oz (98.2 kg)   SpO2 96%   BMI 30.18 kg/m  General: Well developed, NAD, see BMI.  Neck: No  thyromegaly  HEENT:  Normocephalic . Face symmetric, atraumatic Lungs:  CTA B Normal respiratory effort, no intercostal retractions, no accessory muscle use. Heart: RRR,  no murmur.  No pretibial edema bilaterally  Abdomen:  Not distended, soft, non-tender. No rebound or rigidity.   Skin: Exposed areas without rash. Not pale. Not jaundice Rectal: External abnormalities: none. Normal sphincter tone. No rectal masses or tenderness.  No stools Prostate: Prostate gland firm and smooth, no enlargement, nodularity, tenderness, mass, asymmetry or induration Neurologic:  alert & oriented X3.  Speech normal, gait appropriate for age and unassisted Strength symmetric and appropriate for age.  Psych: Cognition and judgment appear intact.  Cooperative with normal attention span and concentration.  Behavior appropriate. No anxious or depressed appearing.     Assessment & Plan:    Assessment DM, w/ neuropathy, Dr Lucianne Muss HTN Hyperlipidemia Atrial fibrillation, 2011, s/p ablation, now sinus rhythm, anticoagulated BPH s/p TUNA ED S/P shingles, postherpetic neuralgia R foot TIA (?) 10-2016, saw neuro, Rx carotid US: L ICA 1-39% otherwise (-)  PLAN DM: Under excellent control per Endo. HTN: Ambulatory BPs checked rarely but they are normal.  On no medications Hyperlipidemia: On simvastatin, check a FLP Other  problems seems stable. RTC 1 year.

## 2018-03-06 NOTE — Assessment & Plan Note (Addendum)
-  Tdap 2012, pnm shot 2015; prevnar 07-2015.  Patient inquired about Shingrix, pros and cons discussed.  Provided first dose  today. -CCS:  Cscope Dr Chales Abrahams in [place] : 2009 hyperplastic polyps.  Colonoscopy 11-2012  Colonoscopy 08/07/2017, no polyps, no colitis, Next 5 years (Dr. Fonnie Mu) -Prostate cancer screening, asymptomatic, DRE normal, check a PSA. -Labs: FLP, CBC, PSA -Diet-exercise - discussed

## 2018-03-06 NOTE — Patient Instructions (Signed)
GO TO THE LAB : Get the blood work     GO TO THE FRONT DESK Schedule your next appointment for a physical exam in 1 year. Come back anytime if you have problems. Schedule your second Shingrix shot in 2 to 3 months from today.   Check the  blood pressure monthly  Be sure your blood pressure is between 110/65 and  135/85. If it is consistently higher or lower, let me know

## 2018-03-07 NOTE — Assessment & Plan Note (Signed)
DM: Under excellent control per Endo. HTN: Ambulatory BPs checked rarely but they are normal.  On no medications Hyperlipidemia: On simvastatin, check a FLP Other  problems seems stable. RTC 1 year.

## 2018-03-21 ENCOUNTER — Other Ambulatory Visit: Payer: Self-pay | Admitting: Endocrinology

## 2018-04-10 ENCOUNTER — Other Ambulatory Visit: Payer: Self-pay | Admitting: Internal Medicine

## 2018-04-26 ENCOUNTER — Other Ambulatory Visit: Payer: Self-pay | Admitting: Endocrinology

## 2018-04-30 ENCOUNTER — Ambulatory Visit (INDEPENDENT_AMBULATORY_CARE_PROVIDER_SITE_OTHER): Payer: BC Managed Care – PPO

## 2018-04-30 DIAGNOSIS — Z23 Encounter for immunization: Secondary | ICD-10-CM | POA: Diagnosis not present

## 2018-05-12 ENCOUNTER — Telehealth: Payer: Self-pay | Admitting: Family Medicine

## 2018-05-13 NOTE — Telephone Encounter (Signed)
Okay to refill, just ask if he is taking 1 or 2 tablets at bedtime and refill accordingly

## 2018-05-13 NOTE — Telephone Encounter (Signed)
Spoke w/ Pt- he is taking 2 tablets at bedtime.

## 2018-05-13 NOTE — Telephone Encounter (Signed)
Pt is requesting refill on Lyrica.   Last OV: 03/06/2018 Last Fill: date not on file?  UDS: None

## 2018-05-13 NOTE — Telephone Encounter (Signed)
sent 

## 2018-05-15 MED ORDER — PREGABALIN 75 MG PO CAPS: 150.0000 mg | ORAL_CAPSULE | Freq: Every day | ORAL | 6 refills | Status: DC

## 2018-05-15 NOTE — Telephone Encounter (Signed)
Can you re-send- [facility] stating they did not get refill?

## 2018-05-15 NOTE — Addendum Note (Signed)
Addended byConrad : Micco Bourbeau D on: 05/15/2018 04:17 PM   Modules accepted: Orders

## 2018-05-15 NOTE — Telephone Encounter (Signed)
4:06pm -- Patient calling and states that the pharmacy is telling him they did not receive the prescription. Advised that on our end it shows they confirmed receipt, but I would look into that.   4:08pm -- [facility] that they did not receive this medication refill.  Please advise.  [facility] - [place], [place] - [address]

## 2018-05-15 NOTE — Telephone Encounter (Signed)
Sent!

## 2018-05-15 NOTE — Addendum Note (Signed)
Addended by: Willow OraPAZ, JOSE E on: 05/15/2018 04:46 PM   Modules accepted: Orders

## 2018-05-16 LAB — HM DIABETES EYE EXAM

## 2018-05-17 ENCOUNTER — Other Ambulatory Visit (INDEPENDENT_AMBULATORY_CARE_PROVIDER_SITE_OTHER): Payer: BC Managed Care – PPO

## 2018-05-17 DIAGNOSIS — Z794 Long term (current) use of insulin: Secondary | ICD-10-CM

## 2018-05-17 DIAGNOSIS — E119 Type 2 diabetes mellitus without complications: Secondary | ICD-10-CM

## 2018-05-17 LAB — HEMOGLOBIN A1C: HEMOGLOBIN A1C: 6.2 % (ref 4.6–6.5)

## 2018-05-17 LAB — BASIC METABOLIC PANEL
BUN: 15 mg/dL (ref 6–23)
CALCIUM: 9.4 mg/dL (ref 8.4–10.5)
CO2: 29 mEq/L (ref 19–32)
Chloride: 103 mEq/L (ref 96–112)
Creatinine, Ser: 1.01 mg/dL (ref 0.40–1.50)
GFR: 80.27 mL/min (ref >60.00)
Glucose, Bld: 126 mg/dL — ABNORMAL HIGH (ref 70–99)
POTASSIUM: 4.8 meq/L (ref 3.5–5.1)
Sodium: 138 mEq/L (ref 135–145)

## 2018-05-17 NOTE — Telephone Encounter (Signed)
Pharmacy called stated that the Rx for pregabalin (LYRICA) 75 MG capsule was received bit need to be changed from 6 refills to 5 refills.  [facility] [address] - [place], [place] - [address] [phone] (Phone) [phone] (Fax)

## 2018-05-17 NOTE — Telephone Encounter (Signed)
See pharmacy request.

## 2018-05-19 NOTE — Progress Notes (Signed)
Patient ID: Danny GreenhouseFred W [name], male   DOB: [birth year], 57 y.o.   MRN: [id number]     Reason for Appointment: Followup for Type 2 Diabetes  Referring physician: Drue NovelPaz  History of Present Illness:          Diagnosis: Type 2 diabetes mellitus, date of diagnosis: 1992        Past history:  He has had long-standing diabetes and previously was treated with metformin, Amaryl, Januvia and Actos He was taken off metformin because of fear of liver problems.  May have been taken taken off Actos because of swelling of his legs but was on 45 mg at that time Over the last 2 years at least his A1c has been consistently over 8% He was started on insulin in 2011 probably when his A1c was over 9% Initially was given Lantus and Humalog and subsequently switched to 70/30 twice a day for unknown reasons Has not taken Byetta or Victoza in the past He had been on 70/30 insulin initially for a few years Previously A1c was 8% or more consistently before starting Invokamet. He was switched from Lantus to [place]iba in 1/17  Recent history:   INSULIN regimen is described as:  Tresiba 38 units at 9 pm Novolog 8 am 10/12 ac bid-tid  Oral hypoglycemic drugs the patient is taking are: Xigduo, 10/998, 1 tablet twice a day  His A1c has been usually in the upper normal range and about the same at 6.2    Current management, blood sugar patterns and problems identified:  He has gone up on his [place]iba on his own by 6 units since his last visit  He says that he is trying to keep his morning sugars down which are higher when he is not as active with cool weather  Also does have some variability in the morning sugars; he also appears to have somewhat low readings when he first wakes up but may be higher by 8 AM  As before he is taking his NovoLog at breakfast and suppertime based on his blood sugars and somewhat based on what he is eating with generally fairly good results  Blood sugars are usually very good  at suppertime even with no insulin at lunch  He says he likes to take Xigduo twice a day as it keeps his blood sugars more even compared to 2 tablets together  Activity level is variable  No documented low sugars           Side effects from medications have been: None  Compliance with the medical regimen: Good  Glucose monitoring:  done 3-4 times a day         Glucometer: One Touch Verio.      Blood Glucose readings  from download:   PRE-MEAL Fasting Lunch Dinner Bedtime Overall  Glucose range:  97-179    87-180   Mean/median:  128  107  94  122 116   POST-MEAL PC Breakfast PC Lunch PC Dinner  Glucose range:     Mean/median:   108    PREVIOUS readings:  Mean values apply above for all meters except median for One Touch  PRE-MEAL Fasting Lunch Dinner Bedtime Overall  Glucose range:  92-146   72-150  81-164   Mean/median:  122  117  97  119 117     Glycemic control:   Lab Results  Component Value Date   HGBA1C 6.2 05/17/2018   HGBA1C 6.4 01/11/2018   HGBA1C 6.1 09/10/2017  Lab Results  Component Value Date   MICROALBUR <0.7 09/10/2017   LDLCALC 52 03/05/2017   CREATININE 1.01 05/17/2018    Retinal exam: annual    Self-care:  Meals: 3 meals per day. Breakfast is eggs or cereal usually,  controlling portions      Dietician visit: Most recent: years ago.    Exercise:  he is recently active with moving equipment at work and some walking, does drive a truck                      Weight history:  Wt Readings from Last 3 Encounters:  05/20/18 212 lb (96.2 kg)  03/06/18 216 lb 6.4 oz (98.2 kg)  01/15/18 213 lb 6.4 oz (96.8 kg)    Lab on 05/17/2018  Component Date Value Ref Range Status  . Sodium 05/17/2018 138  135 - 145 mEq/L Final  . Potassium 05/17/2018 4.8  3.5 - 5.1 mEq/L Final  . Chloride 05/17/2018 103  96 - 112 mEq/L Final  . CO2 05/17/2018 29  19 - 32 mEq/L Final  . Glucose, Bld 05/17/2018 126* 70 - 99 mg/dL Final  . BUN 40/98/1191 15  6 - 23  mg/dL Final  . Creatinine, Ser 05/17/2018 1.01  0.40 - 1.50 mg/dL Final  . Calcium 47/82/9562 9.4  8.4 - 10.5 mg/dL Final  . GFR 13/01/6577 80.27  >60.00 mL/min Final  . Hgb A1c MFr Bld 05/17/2018 6.2  4.6 - 6.5 % Final   Glycemic Control Guidelines for People with Diabetes:Non Diabetic:  <6%Goal of Therapy: <7%Additional Action Suggested:  >8%     Allergies as of 05/20/2018   No Known Allergies     Medication List        Accurate as of 05/20/18  8:42 AM. Always use your most recent med list.          acetaminophen 650 MG CR tablet Commonly known as:  TYLENOL Take 650 mg by mouth every 8 (eight) hours as needed for pain.   azelastine 0.1 % nasal spray Commonly known as:  ASTELIN PLACE 2 SPRAYS INTO BOTH NOSTRILS EVERY NIGHT AT BEDTIME AS NEEDED FOR RHINITIS   dicyclomine 10 MG capsule Commonly known as:  BENTYL Take 10 mg by mouth 4 (four) times daily -  before meals and at bedtime.   ELIQUIS 5 MG Tabs tablet Generic drug:  apixaban TAKE 1 TABLET(5 MG) BY MOUTH TWICE DAILY   fish oil-omega-3 fatty acids 1000 MG capsule Take 2 g by mouth 2 (two) times daily.   fluticasone 50 MCG/ACT nasal spray Commonly known as:  FLONASE Place 2 sprays into both nostrils daily as needed for allergies or rhinitis.   glucagon 1 MG injection Inject 1 mg into the vein once as needed.   Insulin Pen Needle 31G X 8 MM Misc Use one per day   Insulin Pen Needle 32G X 4 MM Misc Use to give insulin injects 4 times daily.   INSULIN SYRINGE .5CC/31GX5/16" 31G X 5/16" 0.5 ML Misc USE ONE SYRINGE AS DIRECTED THREE TO FOUR TIMES DAILY   loratadine 10 MG tablet Commonly known as:  CLARITIN Take 10 mg by mouth daily.   NOVOLOG FLEXPEN 100 UNIT/ML FlexPen Generic drug:  insulin aspart INJECT 15 UNITS SUBCUTANEOUSLY THREE TIMES DAILY   ONETOUCH VERIO test strip Generic drug:  glucose blood USE 1 STRIP TO CHECK GLUCOSE 4 TIMES DAILY   ONETOUCH VERIO test strip Generic drug:  glucose  blood USE 1  STRIP TO CHECK GLUCOSE 4 TIMES DAILY   pregabalin 75 MG capsule Commonly known as:  LYRICA Take 2 capsules (150 mg total) by mouth at bedtime.   simvastatin 20 MG tablet Commonly known as:  ZOCOR Take 0.5 tablets (10 mg total) by mouth at bedtime.   SYSTANE ULTRA PF 0.4-0.3 % Soln Generic drug:  Polyethyl Glyc-Propyl Glyc PF Apply 2 drops to eye 3 (three) times daily as needed (for dry eyes).   tamsulosin 0.4 MG Caps capsule Commonly known as:  FLOMAX Take 1 capsule (0.4 mg total) by mouth daily.   TRESIBA FLEXTOUCH 100 UNIT/ML Sopn FlexTouch Pen Generic drug:  insulin degludec INJECT 40 UNITS SUBCUTANEOUSLY AT BEDTIME   XIGDUO XR 10-998 MG Tb24 Generic drug:  Dapagliflozin-metFORMIN HCl ER TAKE 1 TABLET BY MOUTH TWICE DAILY -REPLACES  INVOKAMET  XR.       Allergies:  No Known Allergies  Past Medical History:  Diagnosis Date  . Allergic rhinitis   . Benign prostatic hypertrophy    s/p TUNA procedure aprx 2006,  . Carpal tunnel syndrome 06/17/2009  . Diabetes mellitus    Type II dx 1999  . 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 ED (erectile dysfunction)    After TUNA  . Elevated LFTs 2010   (-) chronic hep  panel  . History of cardiovascular stress test 8/11   Negative  . Hyperlipidemia   . HYPERSOMNIA UNSPECIFIED 12/11/2007  . Hypertension   . Obesity   . PAF (paroxysmal atrial fibrillation) (HCC) 12/21/2009   a. Failed Multaq. b. Prev on flecainide. c. s/p afib ablation 2015.    Past Surgical History:  Procedure Laterality Date  . ABLATION  03-24-14   PVI by Dr Johney Frame  . ATRIAL FIBRILLATION ABLATION N/A 03/24/2014   Procedure: ATRIAL FIBRILLATION ABLATION;  Surgeon: Gardiner Rhyme, MD;  Location: [facility] CATH LAB;  Service: Cardiovascular;  Laterality: N/A;  . CYSTOSCOPY  11-09   neg  @ urology, was rx Flomax  . PROSTATE SURGERY     TUNA  . TEE WITHOUT CARDIOVERSION N/A 03/23/2014   Procedure: TRANSESOPHAGEAL  ECHOCARDIOGRAM (TEE);  Surgeon: Pricilla Riffle, MD;  Location: [facility] ENDOSCOPY;  Service: Cardiovascular;  Laterality: N/A;  . VASECTOMY     several years ago    Family History  Problem Relation Age of Onset  . Heart disease Mother   . Diabetes Mother   . Hypertension Mother   . Congestive Heart Failure Mother   . Heart attack Mother   . Hypertension Father   . Colon cancer Neg Hx   . Prostate cancer Neg Hx     Social History:  reports that he quit smoking about 32 years ago. His smoking use included cigarettes. He has never used smokeless tobacco. He reports that he does not drink alcohol or use drugs.    Review of Systems       Lipids: He has been on low dose simvastatin for a few years, Followed by PCP also Triglycerides still relatively high despite taking some fish oil OTC      Lab Results  Component Value Date   CHOL 116 03/06/2018   HDL 37.80 (L) 03/06/2018   LDLCALC 52 03/05/2017   LDLDIRECT 68.0 03/06/2018   TRIG 246.0 (H) 03/06/2018   CHOLHDL 3 03/06/2018               No hypertension: His microalbumin has been normal, he is taking Farxiga  BP Readings from  Last 3 Encounters:  05/20/18 130/70  03/06/18 130/78  01/15/18 138/82          For several years has a history of Numbness, tingling  in feet and lower legs He has relief with 150 mg Lyrica prescribed by his PCP   LABS:  Lab on 05/17/2018  Component Date Value Ref Range Status  . Sodium 05/17/2018 138  135 - 145 mEq/L Final  . Potassium 05/17/2018 4.8  3.5 - 5.1 mEq/L Final  . Chloride 05/17/2018 103  96 - 112 mEq/L Final  . CO2 05/17/2018 29  19 - 32 mEq/L Final  . Glucose, Bld 05/17/2018 126* 70 - 99 mg/dL Final  . BUN 60/45/4098 15  6 - 23 mg/dL Final  . Creatinine, Ser 05/17/2018 1.01  0.40 - 1.50 mg/dL Final  . Calcium 11/91/4782 9.4  8.4 - 10.5 mg/dL Final  . GFR 95/62/1308 80.27  >60.00 mL/min Final  . Hgb A1c MFr Bld 05/17/2018 6.2  4.6 - 6.5 % Final   Glycemic Control Guidelines for  People with Diabetes:Non Diabetic:  <6%Goal of Therapy: <7%Additional Action Suggested:  >8%     Physical Examination:  BP 130/70   Pulse 78   Ht 5\' 11"  (1.803 m)   Wt 212 lb (96.2 kg)   BMI 29.57 kg/m   ASSESSMENT/PLAN:     Diabetes type 2  with BMI 29 on insulin  See history of present illness for detailed discussion of current diabetes management, blood sugar patterns and problems identified  His A1c is fairly good and fairly consistent, now 6.2  His blood sugars are fairly stable throughout the day although on an average the highest in the mornings at breakfast He does very well with checking his blood sugars about 4 times a day including after meals and has only minimal postprandial hypoglycemia overall  Takes relatively small doses of NovoLog and this is helped by his being active usually Also benefiting with Davonna Belling We will continue his regimen unchanged He does like the idea of doing the freestyle libre sensor again and asked him to get an arm band from [place] to keep it on, he will call if he needs a prescription but he will check his out-of-pocket expense first  LIPIDS to be followed by his PCP   There are no Patient Instructions on file for this visit.    Reather Littler 05/20/2018, 8:42 AM   Note: This office note was prepared with Dragon voice recognition system technology. Any transcriptional errors that result from this process are unintentional. .

## 2018-05-20 ENCOUNTER — Encounter: Payer: Self-pay | Admitting: Endocrinology

## 2018-05-20 ENCOUNTER — Ambulatory Visit: Payer: BC Managed Care – PPO | Admitting: Endocrinology

## 2018-05-20 VITALS — BP 130/70 | HR 78 | Ht 71.0 in | Wt 212.0 lb

## 2018-05-20 DIAGNOSIS — E119 Type 2 diabetes mellitus without complications: Secondary | ICD-10-CM | POA: Diagnosis not present

## 2018-05-24 ENCOUNTER — Other Ambulatory Visit: Payer: Self-pay | Admitting: Internal Medicine

## 2018-06-30 IMAGING — MR MR CERVICAL SPINE W/O CM
4 of 5 series · 19 of 48 positions shown · non-contrast
Comparison: None.

CLINICAL DATA: Face and neck numbness.  Bilateral finger tingling.

EXAM:
MRI CERVICAL SPINE WITHOUT CONTRAST
TECHNIQUE: Multiplanar, multisequence MR imaging of the cervical spine was
performed. No intravenous contrast was administered.

[Series 4: T2 · sagittal · 3.0mm · 0.43mm/px · 8 of 15 slices shown (1 of 2)]
[im 1/15]
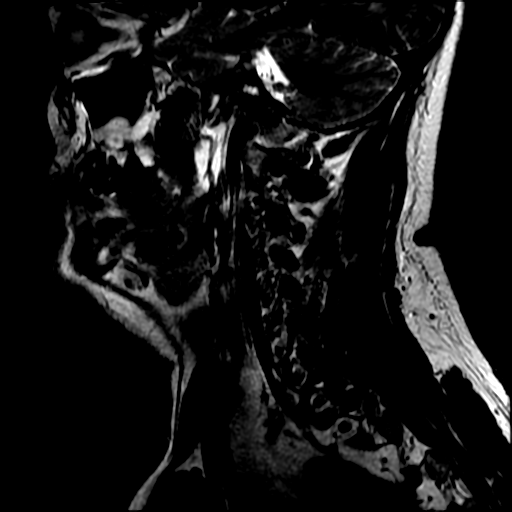
[im 3/15]
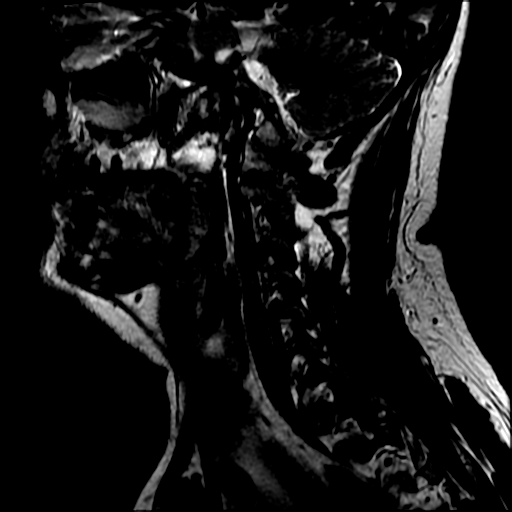
[im 5/15]
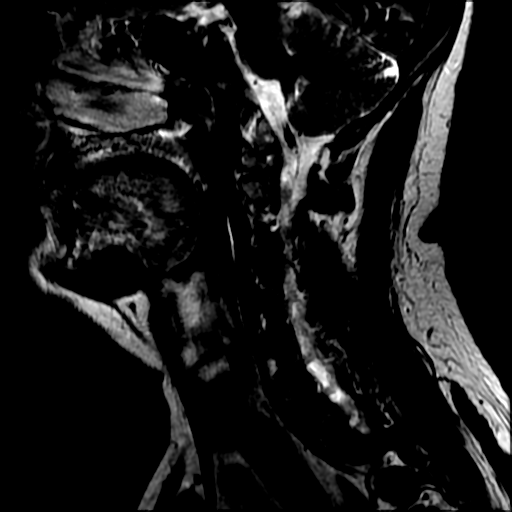
[im 7/15]
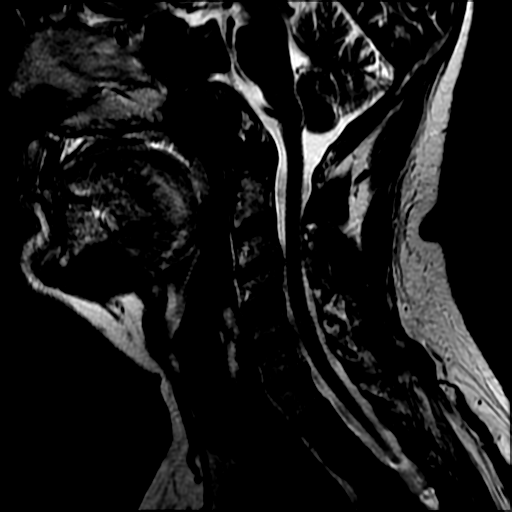
[im 9/15]
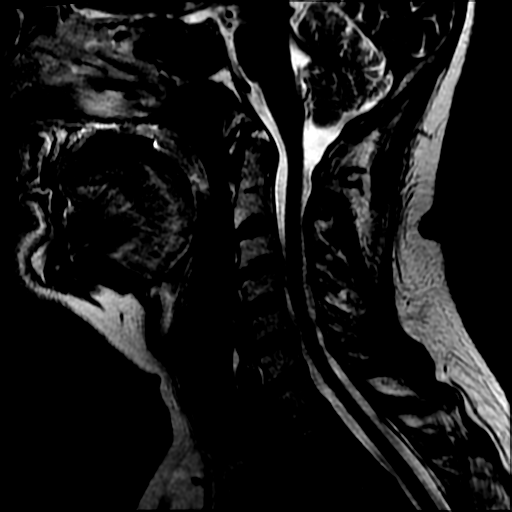
[im 11/15]
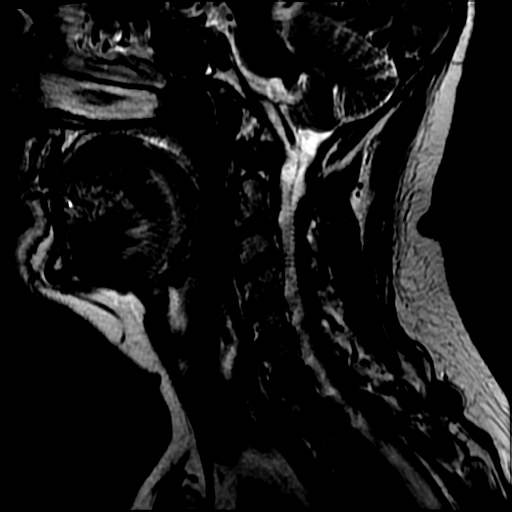
[im 13/15]
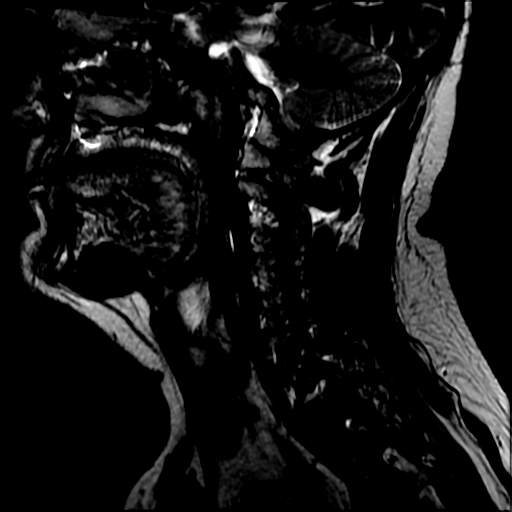
[im 15/15]
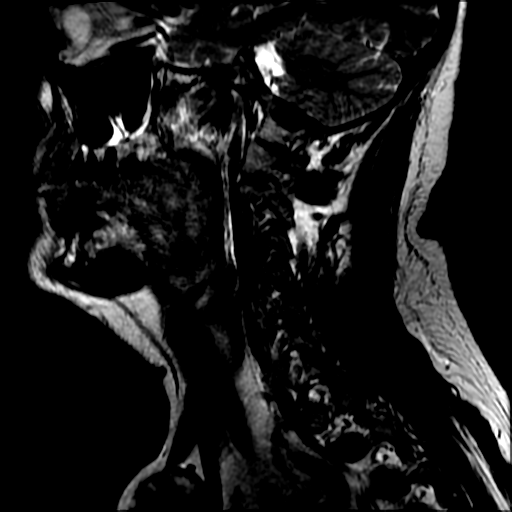

[Series 5: FLAIR · sagittal · 3.0mm · 0.43mm/px · 3 of 15 slices shown]
[im 3/15]
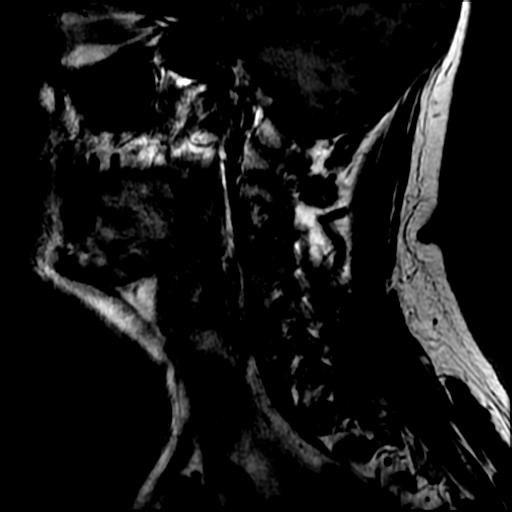
[im 8/15]
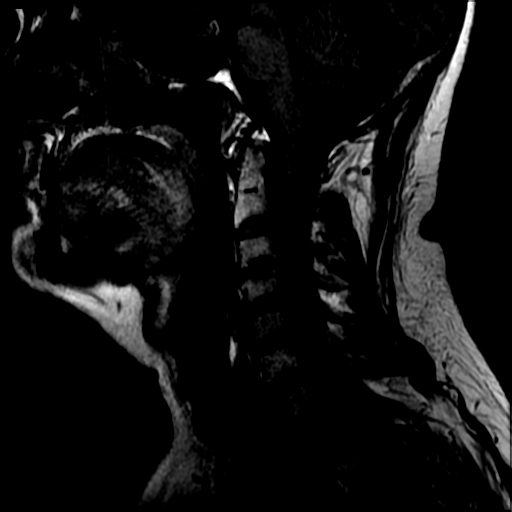
[im 12/15]
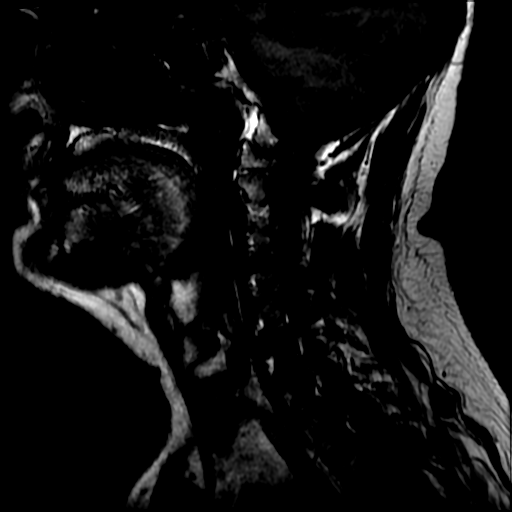

[Series 6: T2 · axial · 3.0mm · 0.35mm/px · z∈[-233,-150]mm · 5 of 16 slices shown (2 of 2)]
[im 1/16]
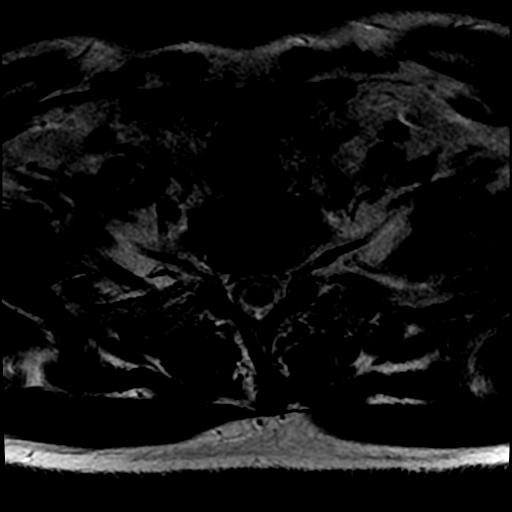
[im 3/16]
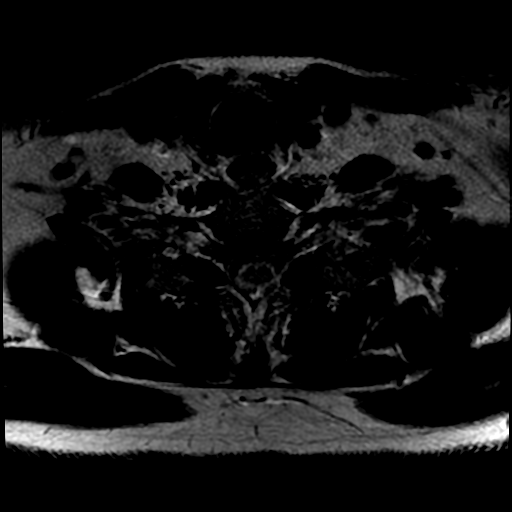
[im 5/16]
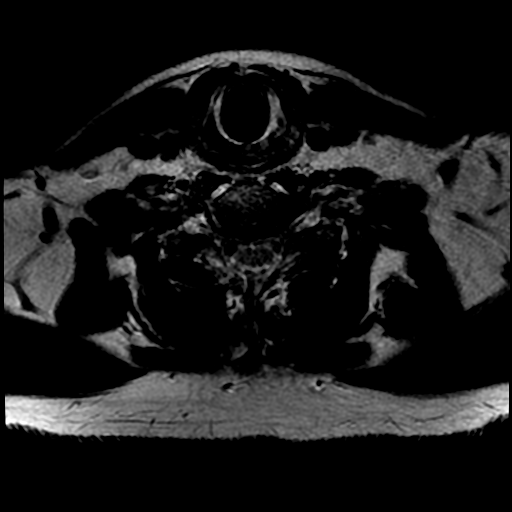
[im 9/16]
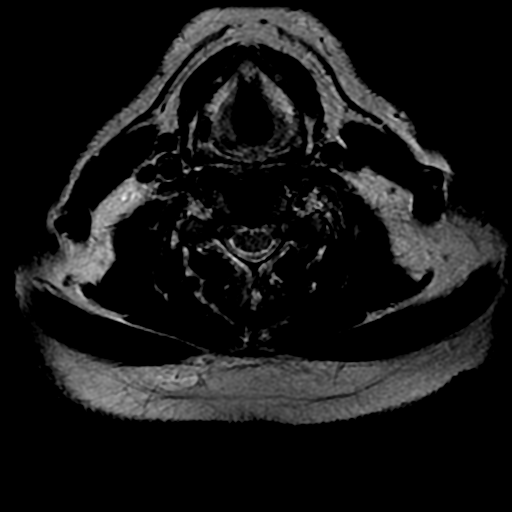
[im 13/16]
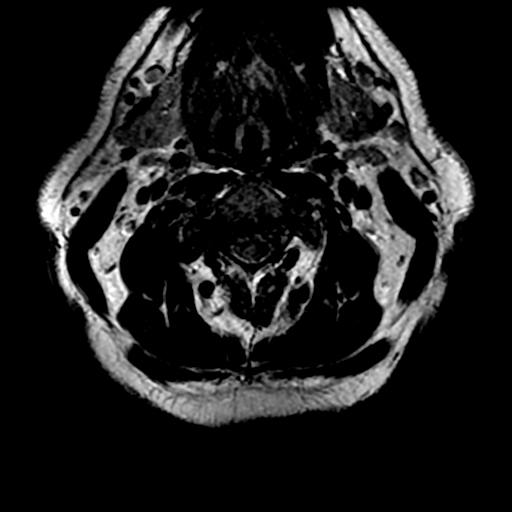

[Series 7: STIR · sagittal · 3.0mm · 0.43mm/px · 3 of 15 slices shown]
[im 3/15]
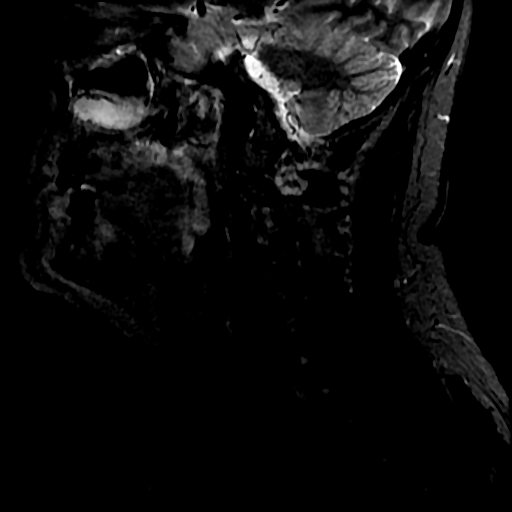
[im 8/15]
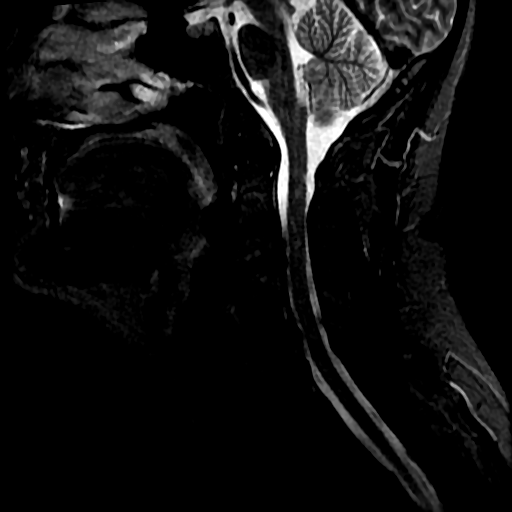
[im 12/15]
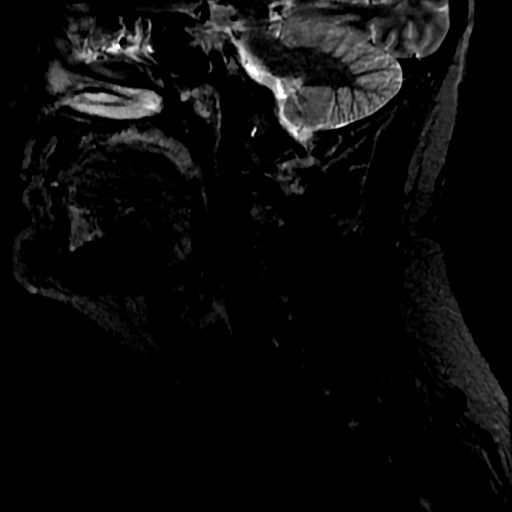

[19 of 48 positions shown; findings below may reference images not displayed]

FINDINGS: Alignment: Physiologic.

Vertebrae: No fracture, evidence of discitis, or bone lesion.

Cord: Normal signal and morphology when accounting for motion
artifact.

Posterior Fossa, vertebral arteries, paraspinal tissues: Negative.

Disc levels:

C2-3: Mild facet spurring on the right. Mild ligamentum flavum
thickening. No impingement

C3-4: Mild disc narrowing with posterior disc osteophyte complex
partially effacing the ventral subarachnoid space. Uncovertebral
spurring with estimated mild to moderate foraminal narrowing on the
left.

C4-5: Unremarkable.

C5-6: Left paracentral disc protrusion contacting the ventral cord
without deformity. Patent foramina

C6-7: Possible tiny central disc protrusion without neural contact.

C7-T1:Unremarkable.
IMPRESSION: 1. No acute finding or evidence of myelopathy.
2. C5-6 left paracentral disc protrusion contacting the cord without
compression.
3. C3-4 borderline spinal stenosis. Mild to moderate left foraminal
stenosis.
4. Sensitivity is diminished by intermittent motion.

## 2018-06-30 IMAGING — MR MR HEAD W/O CM
9 of 10 series · 35 of 48 positions shown · non-contrast
Comparison: Head CTs without contrast 10/25/2016 and earlier.

CLINICAL DATA: 55-year-old male code stroke, left facial numbness.
Dizziness. A fib.

EXAM:
MRI HEAD WITHOUT CONTRAST
TECHNIQUE: Multiplanar, multiecho pulse sequences of the brain and surrounding
structures were obtained without intravenous contrast.

[Series 2: FLAIR · sagittal · 5.0mm · 0.47mm/px · 1 of 25 slices shown (1 of 2)]
[im 1/25]
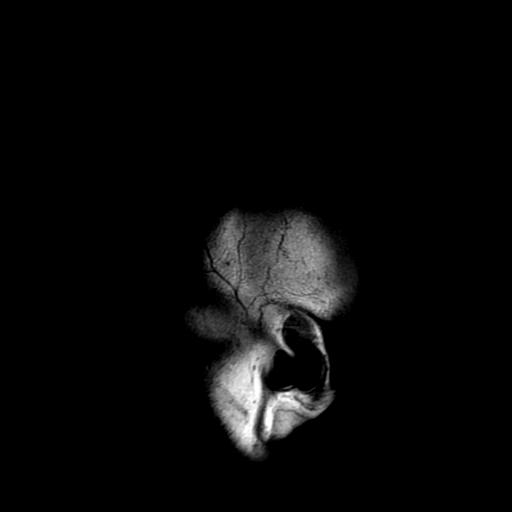

[Series 4: DWI · axial · 3.0mm · 0.94mm/px · z∈[-80,+67]mm · 9 of 100 slices shown (1 of 2)]
[im 1/100]
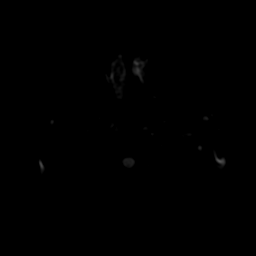
[im 13/100]
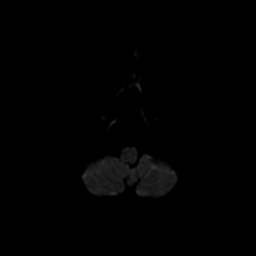
[im 25/100]
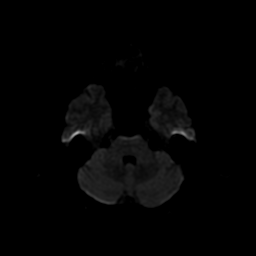
[im 38/100]
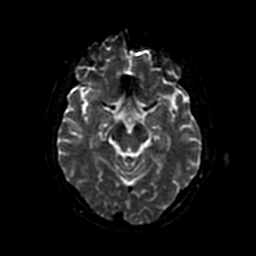
[im 50/100]
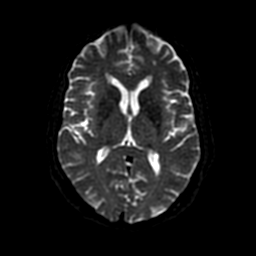
[im 62/100]
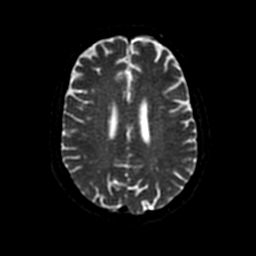
[im 75/100]
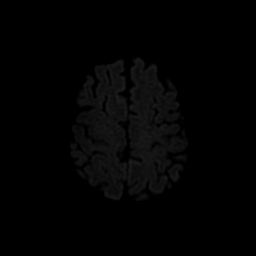
[im 87/100]
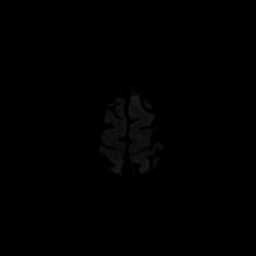
[im 100/100]
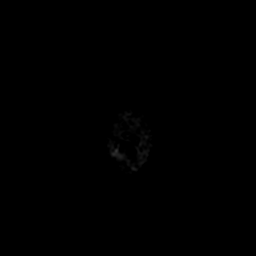

[Series 5: T2 · axial · 5.0mm · 0.47mm/px · z∈[-97,+71]mm · 3 of 29 slices shown (1 of 2)]
[im 1/29]
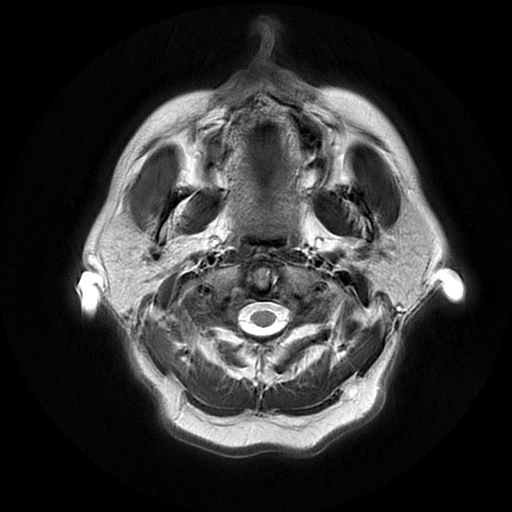
[im 15/29]
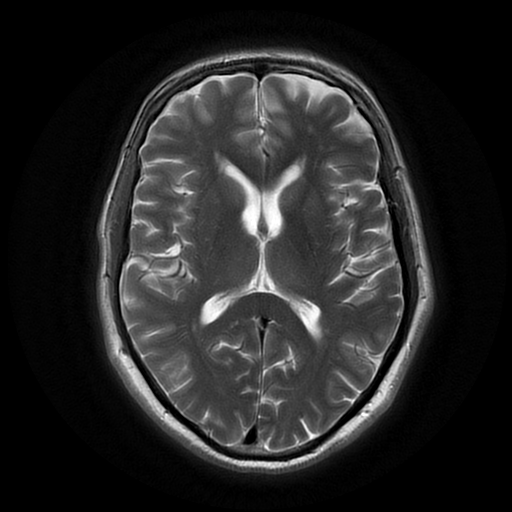
[im 29/29]
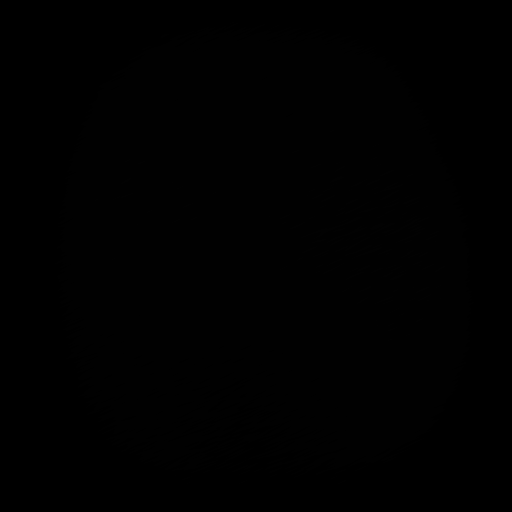

[Series 6: FLAIR · axial · 4.0mm · 0.47mm/px · z∈[-98,+72]mm · 3 of 31 slices shown (2 of 2)]
[im 1/31]
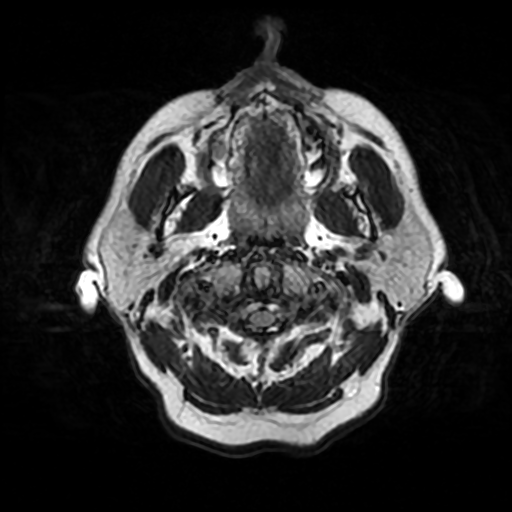
[im 16/31]
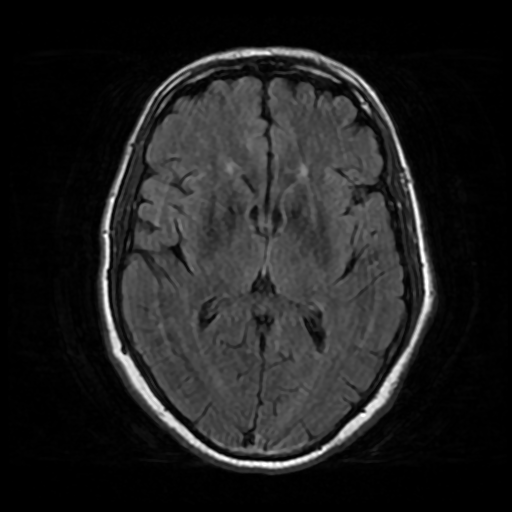
[im 31/31]
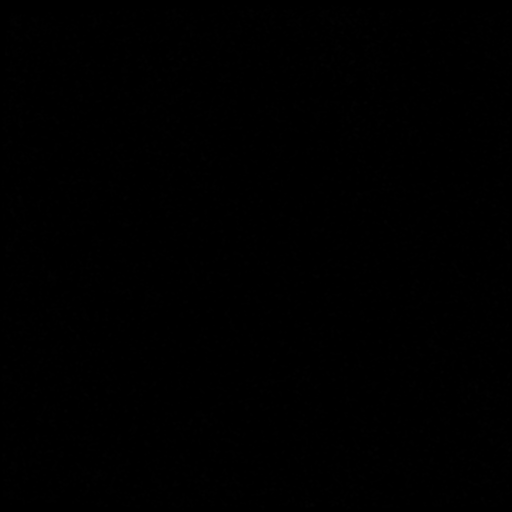

[Series 7: DWI · coronal · 4.0mm · 0.94mm/px · 7 of 75 slices shown (2 of 2)]
[im 1/75]
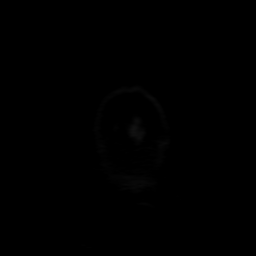
[im 13/75]
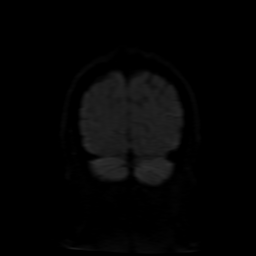
[im 25/75]
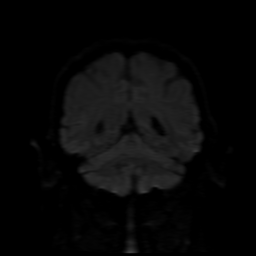
[im 38/75]
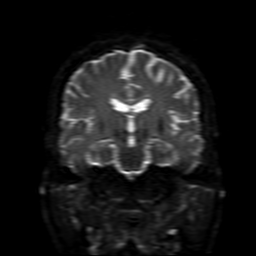
[im 50/75]
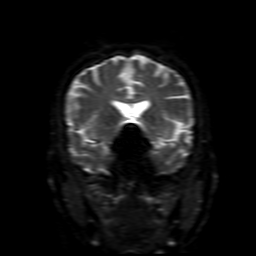
[im 62/75]
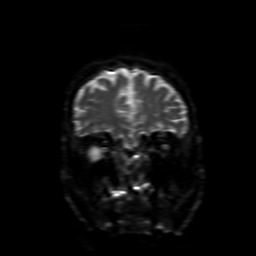
[im 75/75]
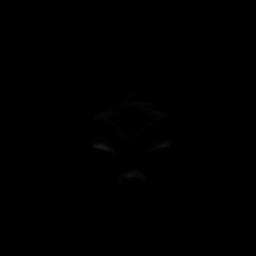

[Series 9: (person_name) · axial · 3.0mm · 0.47mm/px · 1 of 100 slices shown]
[im 1/100]
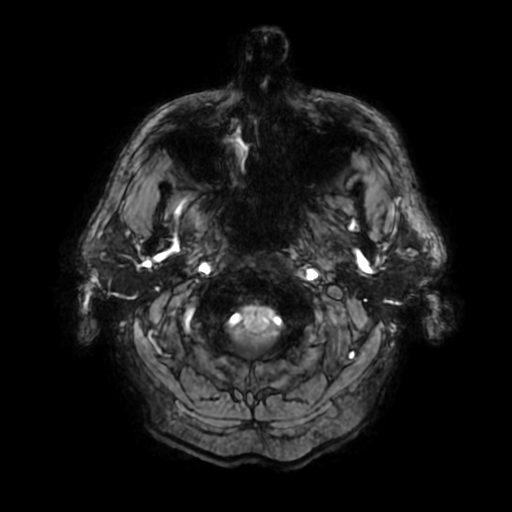

[Series 11: T2 · coronal · 5.0mm · 0.39mm/px · 3 of 32 slices shown (2 of 2)]
[im 1/32]
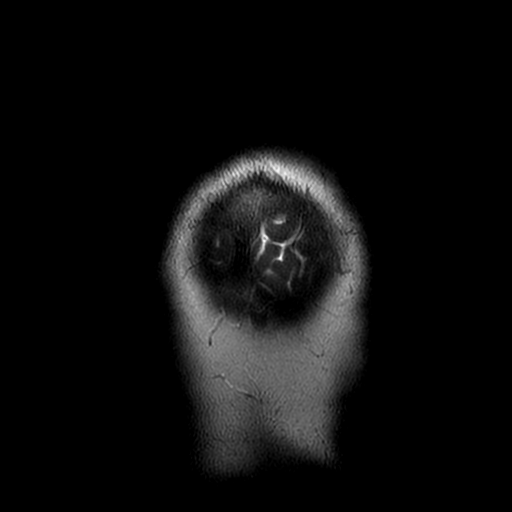
[im 16/32]
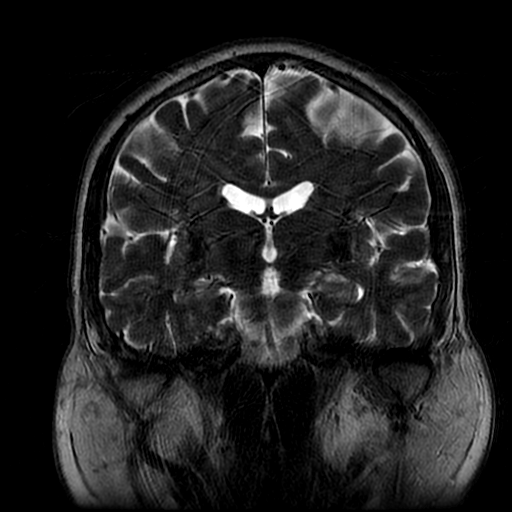
[im 32/32]
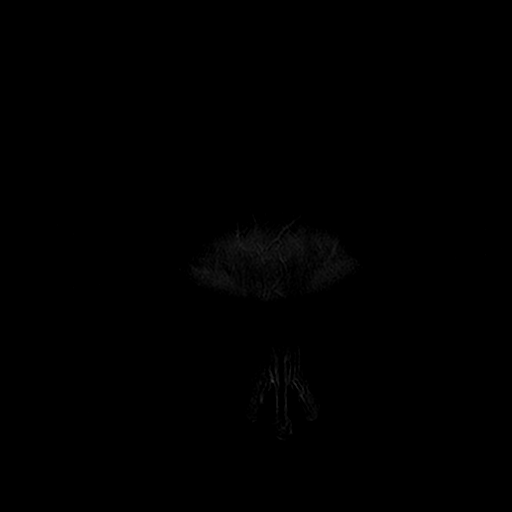

[Series 450: ADC · axial · 3.0mm · 0.94mm/px · z∈[-80,+67]mm · 5 of 50 slices shown (1 of 2)]
[im 1/50]
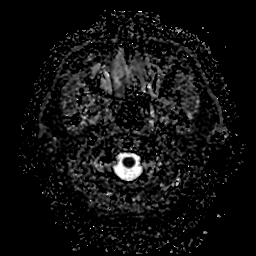
[im 13/50]
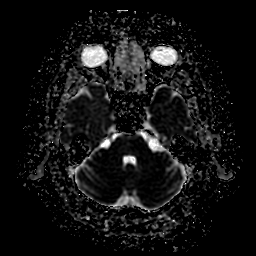
[im 25/50]
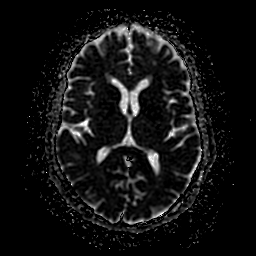
[im 37/50]
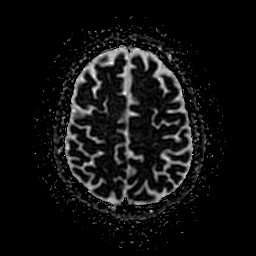
[im 50/50]
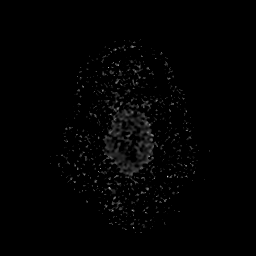

[Series 750: ADC · coronal · 4.0mm · 0.94mm/px · 3 of 38 slices shown (2 of 2)]
[im 1/38]
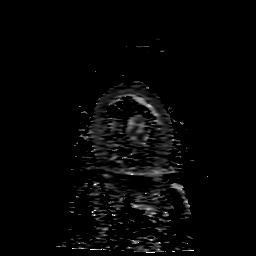
[im 19/38]
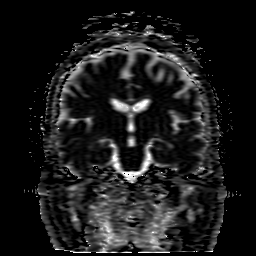
[im 38/38]
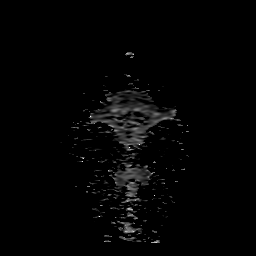

[35 of 48 positions shown; findings below may reference images not displayed]

FINDINGS: Brain: No restricted diffusion to suggest acute infarction. No
midline shift, mass effect, evidence of mass lesion,
ventriculomegaly, extra-axial collection or acute intracranial
hemorrhage. Cervicomedullary junction and pituitary are within
normal limits.

No cortical encephalomalacia or chronic cerebral blood products
identified. Scattered small foci of T2 and FLAIR hyperintensity in
the bilateral cerebral white matter, extent is mild for age. Deep
gray matter nuclei, brainstem, and cerebellum appear normal.

Vascular: Major intracranial vascular flow voids are preserved.

Skull and upper cervical spine: Negative aside from mild
degenerative ligamentous hypertrophy about the odontoid. Normal bone
marrow signal.

Sinuses/Orbits: Negative orbits soft tissues. Ethmoid sinus mucosal
thickening and opacification again noted. Trace maxillary sinus
mucosal thickening. Other Visualized paranasal sinuses and mastoids
are stable and well pneumatized.

Other: Visible internal auditory structures appear normal. Negative
scalp soft tissues.
IMPRESSION: 1.  No acute intracranial abnormality.
2. Mild for age nonspecific cerebral white matter signal changes.
3. Mild to moderate ethmoid sinus inflammation.

## 2018-07-24 ENCOUNTER — Other Ambulatory Visit: Payer: Self-pay | Admitting: Endocrinology

## 2018-08-16 ENCOUNTER — Other Ambulatory Visit: Payer: Self-pay | Admitting: Endocrinology

## 2018-08-25 ENCOUNTER — Other Ambulatory Visit: Payer: Self-pay | Admitting: Endocrinology

## 2018-09-16 ENCOUNTER — Other Ambulatory Visit (INDEPENDENT_AMBULATORY_CARE_PROVIDER_SITE_OTHER): Payer: BC Managed Care – PPO

## 2018-09-16 ENCOUNTER — Other Ambulatory Visit: Payer: Self-pay

## 2018-09-16 DIAGNOSIS — E119 Type 2 diabetes mellitus without complications: Secondary | ICD-10-CM | POA: Diagnosis not present

## 2018-09-16 LAB — COMPREHENSIVE METABOLIC PANEL
ALBUMIN: 4.4 g/dL (ref 3.5–5.2)
ALT: 43 U/L (ref 0–53)
AST: 26 U/L (ref 0–37)
Alkaline Phosphatase: 69 U/L (ref 39–117)
BUN: 18 mg/dL (ref 6–23)
CO2: 30 mEq/L (ref 19–32)
Calcium: 9.4 mg/dL (ref 8.4–10.5)
Chloride: 103 mEq/L (ref 96–112)
Creatinine, Ser: 0.95 mg/dL (ref 0.40–1.50)
GFR: 80.96 mL/min (ref >60.00)
Glucose, Bld: 104 mg/dL — ABNORMAL HIGH (ref 70–99)
Potassium: 4.8 mEq/L (ref 3.5–5.1)
Sodium: 138 mEq/L (ref 135–145)
Total Bilirubin: 0.4 mg/dL (ref 0.2–1.2)
Total Protein: 7.3 g/dL (ref 6.0–8.3)

## 2018-09-16 LAB — HEMOGLOBIN A1C: HEMOGLOBIN A1C: 6.4 % (ref 4.6–6.5)

## 2018-09-16 LAB — MICROALBUMIN / CREATININE URINE RATIO
Creatinine,U: 144.3 mg/dL
Microalb Creat Ratio: 0.5 mg/g (ref 0.0–30.0)

## 2018-09-18 ENCOUNTER — Encounter: Payer: Self-pay | Admitting: Endocrinology

## 2018-09-18 ENCOUNTER — Other Ambulatory Visit: Payer: Self-pay

## 2018-09-18 ENCOUNTER — Ambulatory Visit: Payer: BC Managed Care – PPO | Admitting: Endocrinology

## 2018-09-18 VITALS — BP 124/60 | HR 76 | Temp 98.0°F | Ht 71.0 in | Wt 215.6 lb

## 2018-09-18 DIAGNOSIS — E1142 Type 2 diabetes mellitus with diabetic polyneuropathy: Secondary | ICD-10-CM | POA: Diagnosis not present

## 2018-09-18 NOTE — Progress Notes (Signed)
Patient ID: Danny [name], male   DOB: June 10, 1961, 58 y.o.   MRN: [id number]     Reason for Appointment: Followup for Type 2 Diabetes  Referring physician: Drue Novel  History of Present Illness:          Diagnosis: Type 2 diabetes mellitus, date of diagnosis: 1992        Past history:  He has had long-standing diabetes and previously was treated with metformin, Amaryl, Januvia and Actos He was taken off metformin because of fear of liver problems.  May have been taken taken off Actos because of swelling of his legs but was on 45 mg at that time Over the last 2 years at least his A1c has been consistently over 8% He was started on insulin in 2011 probably when his A1c was over 9% Initially was given Lantus and Humalog and subsequently switched to 70/30 twice a day for unknown reasons Has not taken Byetta or Victoza in the past He had been on 70/30 insulin initially for a few years Previously A1c was 8% or more consistently before starting Invokamet. He was switched from Lantus to [place] in 1/17  Recent history:   INSULIN regimen is described as:  Tresiba 38 units at 9 pm Novolog 8 am 10/12 ac bid-tid  Oral hypoglycemic drugs the patient is taking are: Xigduo, 10/998, 1 tablet twice a day  His A1c has been usually in the upper normal range and about the same at 6.2    Current management, blood sugar patterns and problems identified:  His blood sugars are still quite variable in the mornings although on an average fairly good, likely also has a dawn phenomenon  Occasionally he may have high readings of eating larger portions at night or late meals  Again with his current dose of [place] his blood sugars before lunch and dinner are fairly good  Usually not taking mealtime coverage at lunchtime unless her blood sugar is above normal and this still works well with his afternoon readings  Hypoglycemia has been minimal with lowest reading 68 at lunch  Although is not  doing a lot of formal exercise he is trying to be active at work  Weight is gone up 2-3 pounds  No side effects with Xigduo and no change in renal function     Has not used the freestyle libre because of cost as well as sometimes the sensor falling off     Side effects from medications have been: None  Compliance with the medical regimen: Good  Glucose monitoring:  done 3-4 times a day         Glucometer: [employer].      Blood Glucose readings  from download:   PRE-MEAL Fasting Lunch Dinner Bedtime Overall  Glucose range:  89-184  68-147     Mean/median:  125  104  103  112   POST-MEAL PC Breakfast PC Lunch PC Dinner  Glucose range:    76-175  Mean/median:    113   PREVIOUS readings  PRE-MEAL Fasting Lunch Dinner Bedtime Overall  Glucose range:  97-179    87-180   Mean/median:  128  107  94  122 116   POST-MEAL PC Breakfast PC Lunch PC Dinner  Glucose range:     Mean/median:   108     Glycemic control:   Lab Results  Component Value Date   HGBA1C 6.4 09/16/2018   HGBA1C 6.2 05/17/2018   HGBA1C 6.4 01/11/2018  Lab Results  Component Value Date   MICROALBUR <0.7 09/16/2018   LDLCALC 52 03/05/2017   CREATININE 0.95 09/16/2018    Retinal exam: annual    Self-care:  Meals: 3 meals per day. Breakfast is eggs or cereal usually,  controlling portions      Dietician visit: Most recent: years ago.    Exercise:  he is active with moving equipment at work and some walking, does drive a truck                      Weight history:  Wt Readings from Last 3 Encounters:  09/18/18 215 lb 9.6 oz (97.8 kg)  05/20/18 212 lb (96.2 kg)  03/06/18 216 lb 6.4 oz (98.2 kg)    Lab on 09/16/2018  Component Date Value Ref Range Status  . Microalb, Ur 09/16/2018 <0.7  0.0 - 1.9 mg/dL Final  . Creatinine,U 16/03/9603 144.3  mg/dL Final  . Microalb Creat Ratio 09/16/2018 0.5  0.0 - 30.0 mg/g Final  . Sodium 09/16/2018 138  135 - 145 mEq/L Final  . Potassium 09/16/2018 4.8   3.5 - 5.1 mEq/L Final  . Chloride 09/16/2018 103  96 - 112 mEq/L Final  . CO2 09/16/2018 30  19 - 32 mEq/L Final  . Glucose, Bld 09/16/2018 104* 70 - 99 mg/dL Final  . BUN 54/02/8118 18  6 - 23 mg/dL Final  . Creatinine, Ser 09/16/2018 0.95  0.40 - 1.50 mg/dL Final  . Total Bilirubin 09/16/2018 0.4  0.2 - 1.2 mg/dL Final  . Alkaline Phosphatase 09/16/2018 69  39 - 117 U/L Final  . AST 09/16/2018 26  0 - 37 U/L Final  . ALT 09/16/2018 43  0 - 53 U/L Final  . Total Protein 09/16/2018 7.3  6.0 - 8.3 g/dL Final  . Albumin 14/78/2956 4.4  3.5 - 5.2 g/dL Final  . Calcium 21/30/8657 9.4  8.4 - 10.5 mg/dL Final  . GFR 84/69/6295 80.96  >60.00 mL/min Final  . Hgb A1c MFr Bld 09/16/2018 6.4  4.6 - 6.5 % Final   Glycemic Control Guidelines for People with Diabetes:Non Diabetic:  <6%Goal of Therapy: <7%Additional Action Suggested:  >8%     Allergies as of 09/18/2018   No Known Allergies     Medication List       Accurate as of September 18, 2018  9:37 AM. Always use your most recent med list.        acetaminophen 650 MG CR tablet Commonly known as:  TYLENOL Take 650 mg by mouth every 8 (eight) hours as needed for pain.   azelastine 0.1 % nasal spray Commonly known as:  ASTELIN PLACE 2 SPRAYS INTO BOTH NOSTRILS EVERY NIGHT AT BEDTIME AS NEEDED FOR RHINITIS   dicyclomine 10 MG capsule Commonly known as:  BENTYL Take 10 mg by mouth 4 (four) times daily -  before meals and at bedtime.   Eliquis 5 MG Tabs tablet Generic drug:  apixaban TAKE 1 TABLET(5 MG) BY MOUTH TWICE DAILY   fish oil-omega-3 fatty acids 1000 MG capsule Take 2 g by mouth 2 (two) times daily.   fluticasone 50 MCG/ACT nasal spray Commonly known as:  FLONASE Place 2 sprays into both nostrils daily as needed for allergies or rhinitis.   glucagon 1 MG injection Commonly known as:  Glucagon Emergency Inject 1 mg into the vein once as needed.   Insulin Pen Needle 31G X 8 MM Misc Commonly known as:  B-D ULTRAFINE III  SHORT PEN Use one per day   Insulin Pen Needle 32G X 4 MM Misc Commonly known as:  BD Pen Needle Nano U/F Use to give insulin injects 4 times daily.   INSULIN SYRINGE .5CC/31GX5/16" 31G X 5/16" 0.5 ML Misc USE ONE SYRINGE AS DIRECTED THREE TO FOUR TIMES DAILY   loratadine 10 MG tablet Commonly known as:  CLARITIN Take 10 mg by mouth daily.   NovoLOG FlexPen 100 UNIT/ML FlexPen Generic drug:  insulin aspart INJECT 15 UNITS SUBCUTANEOUSLY THREE TIMES DAILY   OneTouch Verio test strip Generic drug:  glucose blood USE 1 STRIP TO CHECK GLUCOSE 4 TIMES DAILY   OneTouch Verio test strip Generic drug:  glucose blood USE 1 STRIP TO CHECK GLUCOSE 4 TIMES DAILY   OneTouch Verio test strip Generic drug:  glucose blood USE 1 STRIP TO CHECK GLUCOSE 4 TIMES DAILY   pregabalin 75 MG capsule Commonly known as:  LYRICA Take 2 capsules (150 mg total) by mouth at bedtime.   simvastatin 20 MG tablet Commonly known as:  ZOCOR Take 0.5 tablets (10 mg total) by mouth at bedtime.   Systane Ultra PF 0.4-0.3 % Soln Generic drug:  Polyethyl Glyc-Propyl Glyc PF Apply 2 drops to eye 3 (three) times daily as needed (for dry eyes).   tamsulosin 0.4 MG Caps capsule Commonly known as:  FLOMAX Take 1 capsule (0.4 mg total) by mouth daily.   Evaristo Bury FlexTouch 100 UNIT/ML Sopn FlexTouch Pen Generic drug:  insulin degludec INJECT 40 UNITS SUBCUTANEOUSLY AT BEDTIME   Xigduo XR 10-998 MG Tb24 Generic drug:  Dapagliflozin-metFORMIN HCl ER TAKE 1 TABLET BY MOUTH TWICE DAILY. REPLACES INVOKAMET XR       Allergies:  No Known Allergies  Past Medical History:  Diagnosis Date  . Allergic rhinitis   . Benign prostatic hypertrophy    s/p TUNA procedure aprx 2006,  . Carpal tunnel syndrome 06/17/2009  . Diabetes mellitus    Type II dx 1999  . 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 ED (erectile dysfunction)    After TUNA  . Elevated LFTs 2010   (-) chronic hep  panel   . History of cardiovascular stress test 8/11   Negative  . Hyperlipidemia   . HYPERSOMNIA UNSPECIFIED 12/11/2007  . Hypertension   . Obesity   . PAF (paroxysmal atrial fibrillation) (HCC) 12/21/2009   a. Failed Multaq. b. Prev on flecainide. c. s/p afib ablation 2015.    Past Surgical History:  Procedure Laterality Date  . ABLATION  03-24-14   PVI by Dr Johney Frame  . ATRIAL FIBRILLATION ABLATION N/A 03/24/2014   Procedure: ATRIAL FIBRILLATION ABLATION;  Surgeon: Gardiner Rhyme, MD;  Location: [facility] CATH [facility];  Service: Cardiovascular;  Laterality: N/A;  . CYSTOSCOPY  11-09   neg  @ urology, was rx Flomax  . PROSTATE SURGERY     TUNA  . TEE WITHOUT CARDIOVERSION N/A 03/23/2014   Procedure: TRANSESOPHAGEAL ECHOCARDIOGRAM (TEE);  Surgeon: Pricilla Riffle, MD;  Location[facility] ENDOSCOPY;  Service: Cardiovascular;  Laterality: N/A;  . VASECTOMY     several years ago    Family History  Problem Relation Age of Onset  . Heart disease Mother   . Diabetes Mother   . Hypertension Mother   . Congestive Heart Failure Mother   . Heart attack Mother   . Hypertension Father   . Colon cancer Neg Hx   . Prostate cancer Neg Hx     Social History:  reports that he quit smoking about 33 years ago. His smoking use included cigarettes. He has never used smokeless tobacco. He reports that he does not drink alcohol or use drugs.    Review of Systems       Lipids: He has been on low dose simvastatin for a few years, Followed by PCP also Triglycerides still relatively high despite taking some fish oil OTC      Lab Results  Component Value Date   CHOL 116 03/06/2018   HDL 37.80 (L) 03/06/2018   LDLCALC 52 03/05/2017   LDLDIRECT 68.0 03/06/2018   TRIG 246.0 (H) 03/06/2018   CHOLHDL 3 03/06/2018               No hypertension: His microalbumin has been normal, he is taking Farxiga  BP Readings from Last 3 Encounters:  09/18/18 124/60  05/20/18 130/70  03/06/18 130/78          For several years has a  history of Numbness, pins-and-needles in feet and lower legs He has relief with 150 mg Lyrica at bedtime prescribed by his PCP.  This is is controlling his nocturnal symptoms   LABS:  Lab on 09/16/2018  Component Date Value Ref Range Status  . Microalb, Ur 09/16/2018 <0.7  0.0 - 1.9 mg/dL Final  . Creatinine,U 21/30/8657 144.3  mg/dL Final  . Microalb Creat Ratio 09/16/2018 0.5  0.0 - 30.0 mg/g Final  . Sodium 09/16/2018 138  135 - 145 mEq/L Final  . Potassium 09/16/2018 4.8  3.5 - 5.1 mEq/L Final  . Chloride 09/16/2018 103  96 - 112 mEq/L Final  . CO2 09/16/2018 30  19 - 32 mEq/L Final  . Glucose, Bld 09/16/2018 104* 70 - 99 mg/dL Final  . BUN 84/69/6295 18  6 - 23 mg/dL Final  . Creatinine, Ser 09/16/2018 0.95  0.40 - 1.50 mg/dL Final  . Total Bilirubin 09/16/2018 0.4  0.2 - 1.2 mg/dL Final  . Alkaline Phosphatase 09/16/2018 69  39 - 117 U/L Final  . AST 09/16/2018 26  0 - 37 U/L Final  . ALT 09/16/2018 43  0 - 53 U/L Final  . Total Protein 09/16/2018 7.3  6.0 - 8.3 g/dL Final  . Albumin 28/41/3244 4.4  3.5 - 5.2 g/dL Final  . Calcium 06/28/7251 9.4  8.4 - 10.5 mg/dL Final  . GFR 66/44/0347 80.96  >60.00 mL/min Final  . Hgb A1c MFr Bld 09/16/2018 6.4  4.6 - 6.5 % Final   Glycemic Control Guidelines for People with Diabetes:Non Diabetic:  <6%Goal of Therapy: <7%Additional Action Suggested:  >8%     Physical Examination:  BP 124/60 (BP Location: Left Arm, Patient Position: Sitting, Cuff Size: Normal)   Pulse 76   Temp 98 F (36.7 C) (Oral)   Ht  (1.803 m)   Wt 215 lb 9.6 oz (97.8 kg)   SpO2 98%   BMI 30.07 kg/m   ASSESSMENT/PLAN:   Diabetic Foot Exam - Simple   Simple Foot Form Diabetic Foot exam was performed with the following findings:  Yes   Visual Inspection No deformities, no ulcerations, no other skin breakdown bilaterally:  Yes Sensation Testing Intact to touch and monofilament testing bilaterally:  Yes Pulse Check Posterior Tibialis and Dorsalis  pulse intact bilaterally:  Yes See comments:  Yes Comments Left dorsalis pedis pulse not easily felt      Diabetes type 2  with BMI 29 on insulin  See history of present illness for detailed discussion  of current diabetes management, blood sugar patterns and problems identified  His A1c is fairly good with A1c 6.4 and relatively stable  He is on basal bolus insulin along with Xigduo  His fasting blood sugars are relatively higher and occasionally as high as 184 This may be related to somewhat inconsistent diet at night but also some dawn phenomena Blood sugars tend to be lower during the day when he is more active also Again taking only small doses of mealtime insulin twice a day only compared to 38 units of basal insulin He still wants to continue checking sugars with a fingerstick since the Josephine Igo is more expensive and does not stay on consistently  No change in insulin needed  LIPIDS to be followed by his PCP  Neuropathy: He will continue Lyrica which prescribed by PCP  Follow-up in 4 months  There are no Patient Instructions on file for this visit.    Reather Littler 09/18/2018, 9:37 AM   Note: This office note was prepared with Dragon voice recognition system technology. Any transcriptional errors that result from this process are unintentional. .

## 2018-09-19 ENCOUNTER — Ambulatory Visit: Payer: BC Managed Care – PPO | Admitting: Endocrinology

## 2018-09-23 ENCOUNTER — Other Ambulatory Visit: Payer: Self-pay | Admitting: Endocrinology

## 2018-10-10 ENCOUNTER — Telehealth: Payer: Self-pay | Admitting: Internal Medicine

## 2018-10-10 NOTE — Telephone Encounter (Signed)
New message   Called pt to schedule past due recall appt with Dr. Johney Frame. Offered pt appt on Monday 04.20.20. Pt states he will call back.

## 2018-10-22 ENCOUNTER — Other Ambulatory Visit: Payer: Self-pay | Admitting: Endocrinology

## 2018-10-22 ENCOUNTER — Other Ambulatory Visit: Payer: Self-pay | Admitting: Internal Medicine

## 2018-10-26 ENCOUNTER — Other Ambulatory Visit: Payer: Self-pay | Admitting: Endocrinology

## 2018-11-10 ENCOUNTER — Other Ambulatory Visit: Payer: Self-pay | Admitting: Internal Medicine

## 2018-11-11 NOTE — Telephone Encounter (Signed)
Pt is a 58 yr old male who last saw Dr. Johney Frame on 06/08/17, I called and spoke with him about his past due appt and gave him instructions to call office for an appt since he is past due. His last weight was 97.8Kg on 09/18/18 and SCr was 0.95 on 09/16/18. Will refill Eliquis 5mg  BID for only

## 2018-11-12 ENCOUNTER — Telehealth: Payer: Self-pay | Admitting: Internal Medicine

## 2018-11-12 ENCOUNTER — Other Ambulatory Visit: Payer: Self-pay | Admitting: Internal Medicine

## 2018-11-12 NOTE — Telephone Encounter (Signed)
New Message    Pt is calling and would like an office visit with Dr Johney Frame before August..    Please call

## 2018-11-13 NOTE — Telephone Encounter (Signed)
Call placed to Pt.  Appt made for 11/22/2018.  Advised would be via mychart.  Advised would receive a call prior to appt to go over meds/vs/mychart.  Pt indicates understanding.

## 2018-11-15 ENCOUNTER — Telehealth: Payer: Self-pay

## 2018-11-15 NOTE — Telephone Encounter (Signed)
Copied from CRM [id number]. Topic: Appointment Scheduling - Scheduling Inquiry for Clinic >> Nov 14, 2018  1:45 PM Reggie Pile, [place] wrote: Reason for CRM: Attempted to call 3x. Patient is in need of of a virtual visit for a med refill. Call back is [phone].

## 2018-11-15 NOTE — Telephone Encounter (Signed)
Virtual visit scheduled 11/20/2018.

## 2018-11-15 NOTE — Telephone Encounter (Signed)
Tried calling Pt again- no answer, unable to leave message.

## 2018-11-15 NOTE — Telephone Encounter (Signed)
Tried calling Pt- no answer, unable to leave message. Will try again later.  

## 2018-11-20 ENCOUNTER — Other Ambulatory Visit: Payer: Self-pay

## 2018-11-20 ENCOUNTER — Telehealth: Payer: Self-pay

## 2018-11-20 ENCOUNTER — Ambulatory Visit (INDEPENDENT_AMBULATORY_CARE_PROVIDER_SITE_OTHER): Payer: BC Managed Care – PPO | Admitting: Internal Medicine

## 2018-11-20 DIAGNOSIS — G629 Polyneuropathy, unspecified: Secondary | ICD-10-CM

## 2018-11-20 MED ORDER — PREGABALIN 75 MG PO CAPS: 150.0000 mg | ORAL_CAPSULE | Freq: Every day | ORAL | 6 refills | Status: DC

## 2018-11-20 NOTE — Progress Notes (Signed)
Subjective:    Patient ID: Danny [name], male    DOB: 14-Feb-1961, 58 y.o.   MRN: [id number]  DOS:  11/20/2018 Type of visit - description: Virtual Visit via Video Note  I connected with@ on 11/21/18 at  4:00 PM EDT by a video enabled telemedicine application and verified that I am speaking with the correct person using two identifiers.   THIS ENCOUNTER IS A VIRTUAL VISIT DUE TO COVID-19 - PATIENT WAS NOT SEEN IN THE OFFICE. PATIENT HAS CONSENTED TO VIRTUAL VISIT / TELEMEDICINE VISIT   Location of patient: home  Location of provider: office  I discussed the limitations of evaluation and management by telemedicine and the availability of in person appointments. The patient expressed understanding and agreed to proceed.  History of Present Illness: Follow-up His main concern today is neuropathy. He has several years history of neuropathy, previously well controlled on gabapentin, then he was switched to Lyrica and he continued to be controlled until few months ago when the symptoms started to resurface. Described the symptom as pain ("burning, cold, needles") at both legs and feet. Worse at night than during the daytime. He ran out of Lyrica 3 days ago and the symptoms are no better or worse.   Review of Systems Denies back pain per se No bladder or bowel incontinence Claudication?:  Pain in the legs increases when he walks.  Past Medical History:  Diagnosis Date  . Allergic rhinitis   . Benign prostatic hypertrophy    s/p TUNA procedure aprx 2006,  . Carpal tunnel syndrome 06/17/2009  . Diabetes mellitus    Type II dx 1999  . 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 ED (erectile dysfunction)    After TUNA  . Elevated LFTs 2010   (-) chronic hep  panel  . History of cardiovascular stress test 8/11   Negative  . Hyperlipidemia   . HYPERSOMNIA UNSPECIFIED 12/11/2007  . Hypertension   . Obesity   . PAF (paroxysmal atrial fibrillation) (HCC)  12/21/2009   a. Failed Multaq. b. Prev on flecainide. c. s/p afib ablation 2015.    Past Surgical History:  Procedure Laterality Date  . ABLATION  03-24-14   PVI by Dr Johney Frame  . ATRIAL FIBRILLATION ABLATION N/A 03/24/2014   Procedure: ATRIAL FIBRILLATION ABLATION;  Surgeon: Gardiner Rhyme, MD;  Location: [facility] CATH LAB;  Service: Cardiovascular;  Laterality: N/A;  . CYSTOSCOPY  11-09   neg  @ urology, was rx Flomax  . PROSTATE SURGERY     TUNA  . TEE WITHOUT CARDIOVERSION N/A 03/23/2014   Procedure: TRANSESOPHAGEAL ECHOCARDIOGRAM (TEE);  Surgeon: Pricilla Riffle, MD;  Location: [facility] ENDOSCOPY;  Service: Cardiovascular;  Laterality: N/A;  . VASECTOMY     several years ago    Social History   Socioeconomic History  . Marital status: Married    Spouse name: Not on file  . Number of children: 3  . Years of education: GED  . Highest education level: Not on file  Occupational History  . Occupation: Works for [employer] (state).  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[employer]    Comment: works for [employer] (state)  Social Needs  . Financial resource strain: Not on file  . Food insecurity:    Worry: Not on file    Inability: Not on file  . Transportation needs:    Medical: Not on file    Non-medical: Not on file  Tobacco Use  .  Smoking status: Former Smoker    Types: Cigarettes    Last attempt to quit: 06/26/1985    Years since quitting: 33.4  . Smokeless tobacco: Never Used  Substance and Sexual Activity  . Alcohol use: No    Comment:    . Drug use: No  . Sexual activity: Not on file  Lifestyle  . Physical activity:    Days per week: Not on file    Minutes per session: Not on file  . Stress: Not on file  Relationships  . Social connections:    Talks on phone: Not on file    Gets together: Not on file    Attends religious service: Not on file    Active member of club or organization: Not on file    Attends meetings of clubs or organizations: Not on file    Relationship status: Not on file   . Intimate partner violence:    Fear of current or ex partner: Not on file    Emotionally abused: Not on file    Physically abused: Not on file    Forced sexual activity: Not on file  Other Topics Concern  . Not on file  Social History Narrative   Lives with wife in a one story home.  Has 3 children.  Works for [employer].  Education: GED.   Household- pt and wife            Allergies as of 11/20/2018   No Known Allergies     Medication List       Accurate as of Nov 20, 2018 11:59 PM. If you have any questions, ask your nurse or doctor.        acetaminophen 650 MG CR tablet Commonly known as:  TYLENOL Take 650 mg by mouth every 8 (eight) hours as needed for pain.   azelastine 0.1 % nasal spray Commonly known as:  ASTELIN PLACE 2 SPRAYS INTO BOTH NOSTRILS EVERY NIGHT AT BEDTIME AS NEEDED FOR RHINITIS   dicyclomine 10 MG capsule Commonly known as:  BENTYL Take 10 mg by mouth 4 (four) times daily -  before meals and at bedtime.   Eliquis 5 MG Tabs tablet Generic drug:  apixaban TAKE 1 TABLET(5 MG) BY MOUTH TWICE DAILY   fish oil-omega-3 fatty acids 1000 MG capsule Take 2 g by mouth 2 (two) times daily.   fluticasone 50 MCG/ACT nasal spray Commonly known as:  FLONASE Place 2 sprays into both nostrils daily as needed for allergies or rhinitis.   glucagon 1 MG injection Commonly known as:  Glucagon Emergency Inject 1 mg into the vein once as needed.   Insulin Pen Needle 31G X 8 MM Misc Commonly known as:  B-D ULTRAFINE III SHORT PEN Use one per day   Insulin Pen Needle 32G X 4 MM Misc Commonly known as:  BD Pen Needle Nano U/F Use to give insulin injects 4 times daily.   INSULIN SYRINGE .5CC/31GX5/16" 31G X 5/16" 0.5 ML Misc USE ONE SYRINGE AS DIRECTED THREE TO FOUR TIMES DAILY   loratadine 10 MG tablet Commonly known as:  CLARITIN Take 10 mg by mouth daily.   NovoLOG FlexPen 100 UNIT/ML FlexPen Generic drug:  insulin aspart INJECT 15 UNITS SUBCUTANEOUSLY  THREE TIMES DAILY   OneTouch Verio test strip Generic drug:  glucose blood USE 1 STRIP TO CHECK GLUCOSE 4 TIMES DAILY   OneTouch Verio test strip Generic drug:  glucose blood USE 1 STRIP TO CHECK GLUCOSE 4 TIMES DAILY  OneTouch Verio test strip Generic drug:  glucose blood USE 1 STRIP TO CHECK GLUCOSE 4 TIMES DAILY   pregabalin 75 MG capsule Commonly known as:  LYRICA Take 2 capsules (150 mg total) by mouth at bedtime.   simvastatin 20 MG tablet Commonly known as:  ZOCOR Take 0.5 tablets (10 mg total) by mouth at bedtime.   Systane Ultra PF 0.4-0.3 % Soln Generic drug:  Polyethyl Glyc-Propyl Glyc PF Apply 2 drops to eye 3 (three) times daily as needed (for dry eyes).   tamsulosin 0.4 MG Caps capsule Commonly known as:  FLOMAX Take 1 capsule (0.4 mg total) by mouth daily.   Evaristo Buryresiba FlexTouch 100 UNIT/ML Sopn FlexTouch Pen Generic drug:  insulin degludec INJECT 40 UNITS SUBCUTANEOUSLY AT BEDTIME   Xigduo XR 10-998 MG Tb24 Generic drug:  Dapagliflozin-metFORMIN HCl ER TAKE 1 TABLET BY MOUTH TWICE DAILY -  REPLACES  INVOKAMET           Objective:   Physical Exam There were no vitals taken for this visit. This is a virtual video visit.  Alert oriented x3, no apparent distress    Assessment     Assessment DM , Dr Lucianne MussKumar Neuropathy due to DM: NCV 2016: + Peripheral neuropathy HTN Hyperlipidemia Atrial fibrillation, 2011, s/p ablation, now sinus rhythm, anticoagulated BPH s/p TUNA ED S/P shingles, postherpetic neuralgia R foot TIA (?) 10-2016, saw neuro, Rx carotid us: L ICA 1-39% otherwise (-)  PLAN Neuropathy: The patient has a long history of neuropathy, at some point was controlled with gabapentin, then Lyrica but now symptoms are not controlled. This is a virtual visit, I do not have the chance to do a clinical exam or check his peripheral pulses. Plan: Refill Lyrica for now, refer to neurology for further management of peripheral neuropathy. RTC 02-2019  CPX

## 2018-11-21 ENCOUNTER — Telehealth: Payer: Self-pay | Admitting: Internal Medicine

## 2018-11-21 MED ORDER — PREGABALIN 75 MG PO CAPS: 150.0000 mg | ORAL_CAPSULE | Freq: Every day | ORAL | 6 refills | Status: DC

## 2018-11-21 NOTE — Assessment & Plan Note (Signed)
Neuropathy: The patient has a long history of neuropathy, at some point was controlled with gabapentin, then Lyrica but now symptoms are not controlled. This is a virtual visit, I do not have the chance to do a clinical exam or check his peripheral pulses. Plan: Refill Lyrica for now, refer to neurology for further management of peripheral neuropathy. RTC 02-2019 CPX

## 2018-11-21 NOTE — Telephone Encounter (Signed)
[facility] calling and states that the Lyrica prescription that was sent in today has 6 refills on it. States that the prescription can only have 5 or less refills on it. States they will need a new prescription sent over showing 5 or less refills. Please advise.  CB#: [phone]

## 2018-11-21 NOTE — Telephone Encounter (Signed)
Can you resend Lyrica prescription please?

## 2018-11-21 NOTE — Telephone Encounter (Signed)
Spoke with pt regarding appt on 11/22/18. Pt stated he did not have any concerns at this time. 

## 2018-11-21 NOTE — Telephone Encounter (Signed)
Copied from CRM [id number]. Topic: Quick Communication - Rx Refill/Question >> Nov 21, 2018  9:55 AM Dalphine Handing A wrote: Medication: pregabalin (LYRICA) 75 MG capsule (Pharmacy asked that we resend prescription. Pharmacy currently has no record of this order.)  Has the patient contacted their pharmacy? Yes (Agent: If no, request that the patient contact the pharmacy for the refill.) (Agent: If yes, when and what did the pharmacy advise?)Contact PCP  Preferred Pharmacy (with phone number or street name): [facility] [address] [place] - [address] [phone] (Phone) [phone] (Fax)    Agent: Please be advised that RX refills may take up to 3 business days. We ask that you follow-up with your pharmacy.

## 2018-11-21 NOTE — Telephone Encounter (Signed)
done

## 2018-11-22 ENCOUNTER — Telehealth (INDEPENDENT_AMBULATORY_CARE_PROVIDER_SITE_OTHER): Payer: BC Managed Care – PPO | Admitting: Internal Medicine

## 2018-11-22 ENCOUNTER — Encounter: Payer: Self-pay | Admitting: Internal Medicine

## 2018-11-22 VITALS — BP 147/81 | HR 68 | Ht 71.0 in | Wt 211.0 lb

## 2018-11-22 DIAGNOSIS — I10 Essential (primary) hypertension: Secondary | ICD-10-CM | POA: Diagnosis not present

## 2018-11-22 DIAGNOSIS — I480 Paroxysmal atrial fibrillation: Secondary | ICD-10-CM

## 2018-11-22 MED ORDER — PREGABALIN 75 MG PO CAPS: 150.0000 mg | ORAL_CAPSULE | Freq: Every day | ORAL | 5 refills | Status: DC

## 2018-11-22 NOTE — Addendum Note (Signed)
Addended by: Wanda Plump on: 11/22/2018 08:39 AM   Modules accepted: Orders

## 2018-11-22 NOTE — Telephone Encounter (Signed)
Sent!

## 2018-11-22 NOTE — Progress Notes (Signed)
Electrophysiology TeleHealth Note   Due to national recommendations of social distancing due to COVID 19, an audio/video telehealth visit is felt to be most appropriate for this patient at this time.  See MyChart message from today for the patient's consent to telehealth for [facility]   Date:  11/22/2018   ID:  Danny [name], DOB 06/05/1961, MRN [id number]  Location: patient's home  Provider location: [address] [place]  Evaluation Performed: Follow-up visit  PCP:  Wanda Plump, MD  Cardiologist:  Dr Jens Som Electrophysiologist:  Dr Johney Frame  Chief Complaint:  afib  History of Present Illness:    Danny [name] is a 58 y.o. male who presents via audio/video conferencing for a telehealth visit today.  Since last being seen in our clinic, the patient reports doing very well.  Today, he denies symptoms of palpitations, chest pain, shortness of breath,  lower extremity edema, dizziness, presyncope, or syncope.  The patient is otherwise without complaint today.  The patient denies symptoms of fevers, chills, cough, or new SOB worrisome for COVID 19.  Past Medical History:  Diagnosis Date  . Allergic rhinitis   . Benign prostatic hypertrophy    s/p TUNA procedure aprx 2006,  . Carpal tunnel syndrome 06/17/2009  . Diabetes mellitus    Type II dx 1999  . 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 ED (erectile dysfunction)    After TUNA  . Elevated LFTs 2010   (-) chronic hep  panel  . History of cardiovascular stress test 8/11   Negative  . Hyperlipidemia   . HYPERSOMNIA UNSPECIFIED 12/11/2007  . Hypertension   . Obesity   . PAF (paroxysmal atrial fibrillation) (HCC) 12/21/2009   a. Failed Multaq. b. Prev on flecainide. c. s/p afib ablation 2015.    Past Surgical History:  Procedure Laterality Date  . ABLATION  03-24-14   PVI by Dr Johney Frame  . ATRIAL FIBRILLATION ABLATION N/A 03/24/2014   Procedure: ATRIAL FIBRILLATION  ABLATION;  Surgeon: Gardiner Rhyme, MD;  Location: [facility] CATH LAB;  Service: Cardiovascular;  Laterality: N/A;  . CYSTOSCOPY  11-09   neg  @ urology, was rx Flomax  . PROSTATE SURGERY     TUNA  . TEE WITHOUT CARDIOVERSION N/A 03/23/2014   Procedure: TRANSESOPHAGEAL ECHOCARDIOGRAM (TEE);  Surgeon: Pricilla Riffle, MD;  Location: [facility] ENDOSCOPY;  Service: Cardiovascular;  Laterality: N/A;  . VASECTOMY     several years ago    Current Outpatient Medications  Medication Sig Dispense Refill  . acetaminophen (TYLENOL) 650 MG CR tablet Take 650 mg by mouth every 8 (eight) hours as needed for pain.    Marland Kitchen azelastine (ASTELIN) 0.1 % nasal spray PLACE 2 SPRAYS INTO BOTH NOSTRILS EVERY NIGHT AT BEDTIME AS NEEDED FOR RHINITIS 30 mL 0  . ELIQUIS 5 MG TABS tablet TAKE 1 TABLET(5 MG) BY MOUTH TWICE DAILY 60 tablet 3  . fish oil-omega-3 fatty acids 1000 MG capsule Take 2 g by mouth 2 (two) times daily.      . fluticasone (FLONASE) 50 MCG/ACT nasal spray Place 2 sprays into both nostrils daily as needed for allergies or rhinitis. 16 g 5  . glucagon (GLUCAGON EMERGENCY) 1 MG injection Inject 1 mg into the vein once as needed. 1 each 5  . Insulin Pen Needle (B-D ULTRAFINE III SHORT PEN) 31G X 8 MM MISC Use one per day 30 each 3  . Insulin Pen Needle (BD PEN  NEEDLE NANO U/F) 32G X 4 MM MISC Use to give insulin injects 4 times daily. 150 each 3  . Insulin Syringe-Needle U-100 (INSULIN SYRINGE .5CC/31GX5/16") 31G X 5/16" 0.5 ML MISC USE ONE SYRINGE AS DIRECTED THREE TO FOUR TIMES DAILY 100 each 3  . loratadine (CLARITIN) 10 MG tablet Take 10 mg by mouth daily.     Marland Kitchen NOVOLOG FLEXPEN 100 UNIT/ML FlexPen INJECT 15 UNITS SUBCUTANEOUSLY THREE TIMES DAILY (Patient taking differently: No sig reported) 15 pen 3  . ONETOUCH VERIO test strip USE 1 STRIP TO CHECK GLUCOSE 4 TIMES DAILY 150 each 1  . ONETOUCH VERIO test strip USE 1 STRIP TO CHECK GLUCOSE 4 TIMES DAILY 150 each 2  . ONETOUCH VERIO test strip USE 1 STRIP TO CHECK GLUCOSE  4 TIMES DAILY 150 each 0  . Polyethyl Glyc-Propyl Glyc PF (SYSTANE ULTRA PF) 0.4-0.3 % SOLN Apply 2 drops to eye 3 (three) times daily as needed (for dry eyes).    . pregabalin (LYRICA) 75 MG capsule Take 2 capsules (150 mg total) by mouth at bedtime. 60 capsule 5  . simvastatin (ZOCOR) 20 MG tablet Take 0.5 tablets (10 mg total) by mouth at bedtime. 45 tablet 1  . tamsulosin (FLOMAX) 0.4 MG CAPS capsule Take 1 capsule (0.4 mg total) by mouth daily. 90 capsule 3  . TRESIBA FLEXTOUCH 100 UNIT/ML SOPN FlexTouch Pen INJECT 40 UNITS SUBCUTANEOUSLY AT BEDTIME 15 pen 5  . XIGDUO XR 10-998 MG TB24 TAKE 1 TABLET BY MOUTH TWICE DAILY -  REPLACES  INVOKAMET 60 tablet 0   No current facility-administered medications for this visit.     Allergies:   Patient has no known allergies.   Social History:  The patient  reports that he quit smoking about 33 years ago. His smoking use included cigarettes. He has never used smokeless tobacco. He reports that he does not drink alcohol or use drugs.   Family History:  The patient's  family history includes Congestive Heart Failure in his mother; Diabetes in his mother; Heart attack in his mother; Heart disease in his mother; Hypertension in his father and mother.   ROS:  Please see the history of present illness.   All other systems are personally reviewed and negative.    Exam:    Vital Signs:  BP (!) 147/81   Pulse 68   Ht 5\' 11"  (1.803 m)   Wt 211 lb (95.7 kg)   SpO2 96%   BMI 29.43 kg/m   Well appearing, alert and conversant, regular work of breathing,  good skin color Eyes- anicteric, neuro- grossly intact, skin- no apparent rash or lesions or cyanosis, mouth- oral mucosa is pink   Labs/Other Tests and Data Reviewed:    Recent Labs: 03/06/2018: Hemoglobin 16.2; Platelets 245.0 09/16/2018: ALT 43; BUN 18; Creatinine, Ser 0.95; Potassium 4.8; Sodium 138   Wt Readings from Last 3 Encounters:  11/22/18 211 lb (95.7 kg)  09/18/18 215 lb 9.6 oz (97.8  kg)  05/20/18 212 lb (96.2 kg)     Other studies personally reviewed: Additional studies/ records that were reviewed today include: my prior notes  Review of the above records today demonstrates: as above  ASSESSMENT & PLAN:    1.  Paroxysmal atrial fibrillation Resolved post ablation (no episodes in several years) off AAD therapy chad2vasc score is 2.  Doing well with eliquis.  We discussed REACT AF as well as a possible loop recorder to guide anticoagulation.  For now he wishes to continue  eliquis but would be happy to consider REACT AF if it becomes an option.  2. HTN Stable No change required today  3. COVID 19 screen The patient denies symptoms of COVID 19 at this time.  The importance of social distancing was discussed today.  Follow-up:  12 months with me  Current medicines are reviewed at length with the patient today.   The patient does not have concerns regarding his medicines.  The following changes were made today:  none  Labs/ tests ordered today include:  No orders of the defined types were placed in this encounter.  Patient Risk:  after full review of this patients clinical status, I feel that they are at moderate risk at this time.  Today, I have spent 15 minutes with the patient with telehealth technology discussing afib .    SignedHillis Range, MD  11/22/2018 1:54 PM     [facility] [address] [place] [address] [phone] (office) [phone] (fax)

## 2018-11-24 ENCOUNTER — Other Ambulatory Visit: Payer: Self-pay | Admitting: Endocrinology

## 2018-11-27 ENCOUNTER — Encounter: Payer: Self-pay | Admitting: Neurology

## 2018-11-29 ENCOUNTER — Other Ambulatory Visit: Payer: Self-pay

## 2018-11-29 ENCOUNTER — Telehealth (INDEPENDENT_AMBULATORY_CARE_PROVIDER_SITE_OTHER): Payer: BC Managed Care – PPO | Admitting: Neurology

## 2018-11-29 VITALS — Ht 71.0 in | Wt 211.0 lb

## 2018-11-29 DIAGNOSIS — E1142 Type 2 diabetes mellitus with diabetic polyneuropathy: Secondary | ICD-10-CM | POA: Diagnosis not present

## 2018-11-29 NOTE — Progress Notes (Signed)
New Patient Virtual Visit via Video Note The purpose of this virtual visit is to provide medical care while limiting exposure to the novel coronavirus.    Consent was obtained for video visit:  Yes.   Answered questions that patient had about telehealth interaction:  Yes.   I discussed the limitations, risks, security and privacy concerns of performing an evaluation and management service by telemedicine. I also discussed with the patient that there may be a patient responsible charge related to this service. The patient expressed understanding and agreed to proceed.  Pt location: Home Physician Location: office Name of referring provider:  Wanda PlumpPaz, Jose E, MD I connected with Danny [name] at patients initiation/request on 11/29/2018 at 12:50 PM EDT by video enabled telemedicine application and verified that I am speaking with the correct person using two identifiers. Pt MRN:  [id number] Pt DOB:  [birth year] Video Participants:  Danny [name]    History of Present Illness: Danny [name] is a 58 y.o. Caucasian male well-controlleddiabetes mellitus complicated by neuropathy, atrial fibrillation s/p ablation, hypertension and hyperlipidemia presenting for evaluation of painful diabetic neuropathy.   He has been diabetic for about 30-years and began having burning, stinging, stabbing, pain along with numbness in the feet.  Over the years, his neuropathy has progressed from his feet and into his lower legs and calf region.  Painful paresthesias are constant with no alleviating or exacerbating factors.  He has intermittent numbness and tingling of the hands.  He denies any weakness of the hands or feet.  He does have imbalance, which is worse on uneven ground.  Fortunately, he has not had any falls and walks unassisted.  He has previously been on gabapentin (no benefit) and and currently takes Lyrica 75 mg at bedtime only.  He is cautious about taking daytime dose due to job which requires  driving.  No history of alcohol use or family history of neuropathy.   Out-side paper records, electronic medical record, and images have been reviewed where available and summarized as:  Lab Results  Component Value Date   HGBA1C 6.4 09/16/2018   Lab Results  Component Value Date   VITAMINB12 344 10/26/2016   Lab Results  Component Value Date   TSH 1.96 08/12/2015   Lab Results  Component Value Date   ESRSEDRATE 10 08/09/2006    Past Medical History:  Diagnosis Date   Allergic rhinitis    Benign prostatic hypertrophy    s/p TUNA procedure aprx 2006,   Carpal tunnel syndrome 06/17/2009   Diabetes mellitus    Type II dx 1999   DM type 2 with diabetic peripheral neuropathy (HCC) 10/15/2014   ED (erectile dysfunction)    After TUNA   Elevated LFTs 2010   (-) chronic hep  panel   History of cardiovascular stress test 8/11   Negative   Hyperlipidemia    HYPERSOMNIA UNSPECIFIED 12/11/2007   Hypertension    Obesity    PAF (paroxysmal atrial fibrillation) (HCC) 12/21/2009   a. Failed Multaq. b. Prev on flecainide. c. s/p afib ablation 2015.    Past Surgical History:  Procedure Laterality Date   ABLATION  03-24-14   PVI by Dr Johney FrameAllred   ATRIAL FIBRILLATION ABLATION N/A 03/24/2014   Procedure: ATRIAL FIBRILLATION ABLATION;  Surgeon: Gardiner RhymeJames D Allred, MD;  Location: [facility] CATH LAB;  Service: Cardiovascular;  Laterality: N/A;   CYSTOSCOPY  11-09   neg  @ urology, was rx Flomax   PROSTATE SURGERY  TUNA   TEE WITHOUT CARDIOVERSION N/A 03/23/2014   Procedure: TRANSESOPHAGEAL ECHOCARDIOGRAM (TEE);  Surgeon: Pricilla Riffle, MD;  Location: [facility] ENDOSCOPY;  Service: Cardiovascular;  Laterality: N/A;   VASECTOMY     several years ago     Medications:  Outpatient Encounter Medications as of 11/29/2018  Medication Sig   acetaminophen (TYLENOL) 650 MG CR tablet Take 650 mg by mouth every 8 (eight) hours as needed for pain.   azelastine (ASTELIN) 0.1 % nasal spray  PLACE 2 SPRAYS INTO BOTH NOSTRILS EVERY NIGHT AT BEDTIME AS NEEDED FOR RHINITIS   ELIQUIS 5 MG TABS tablet TAKE 1 TABLET(5 MG) BY MOUTH TWICE DAILY   fish oil-omega-3 fatty acids 1000 MG capsule Take 2 g by mouth 2 (two) times daily.     fluticasone (FLONASE) 50 MCG/ACT nasal spray Place 2 sprays into both nostrils daily as needed for allergies or rhinitis.   glucagon (GLUCAGON EMERGENCY) 1 MG injection Inject 1 mg into the vein once as needed.   Insulin Pen Needle (B-D ULTRAFINE III SHORT PEN) 31G X 8 MM MISC Use one per day   Insulin Pen Needle (BD PEN NEEDLE NANO U/F) 32G X 4 MM MISC Use to give insulin injects 4 times daily.   Insulin Syringe-Needle U-100 (INSULIN SYRINGE .5CC/31GX5/16") 31G X 5/16" 0.5 ML MISC USE ONE SYRINGE AS DIRECTED THREE TO FOUR TIMES DAILY   loratadine (CLARITIN) 10 MG tablet Take 10 mg by mouth daily.    NOVOLOG FLEXPEN 100 UNIT/ML FlexPen INJECT 15 UNITS SUBCUTANEOUSLY THREE TIMES DAILY (Patient taking differently: No sig reported)   ONETOUCH VERIO test strip USE 1 STRIP TO CHECK GLUCOSE 4 TIMES DAILY   ONETOUCH VERIO test strip USE 1 STRIP TO CHECK GLUCOSE 4 TIMES DAILY   ONETOUCH VERIO test strip USE 1 STRIP TO CHECK GLUCOSE 4 TIMES DAILY   Polyethyl Glyc-Propyl Glyc PF (SYSTANE ULTRA PF) 0.4-0.3 % SOLN Apply 2 drops to eye 3 (three) times daily as needed (for dry eyes).   simvastatin (ZOCOR) 20 MG tablet Take 0.5 tablets (10 mg total) by mouth at bedtime.   tamsulosin (FLOMAX) 0.4 MG CAPS capsule Take 1 capsule (0.4 mg total) by mouth daily.   TRESIBA FLEXTOUCH 100 UNIT/ML SOPN FlexTouch Pen INJECT 40 UNITS SUBCUTANEOUSLY AT BEDTIME   XIGDUO XR 10-998 MG TB24 Take 1 tablet by mouth twice daily   pregabalin (LYRICA) 75 MG capsule Take 2 capsules (150 mg total) by mouth at bedtime. (Patient not taking: Reported on 11/27/2018)   No facility-administered encounter medications on file as of 11/29/2018.     Allergies: No Known Allergies  Family  History: Family History  Problem Relation Age of Onset   Heart disease Mother    Diabetes Mother    Hypertension Mother    Congestive Heart Failure Mother    Heart attack Mother    Hypertension Father    Colon cancer Neg Hx    Prostate cancer Neg Hx     Social History: Social History   Tobacco Use   Smoking status: Former Smoker    Types: Cigarettes    Last attempt to quit: 06/26/1985    Years since quitting: 33.4   Smokeless tobacco: Never Used  Substance Use Topics   Alcohol use: No    Comment:     Drug use: No   Social History   Social History Narrative   Lives with wife in a one story home.  Has 3 children.  Works for [employer].  Education:  GED.   Household- pt and wife          Review of Systems:  CONSTITUTIONAL: No fevers, chills, night sweats, or weight loss.   EYES: No visual changes or eye pain ENT: No hearing changes.  No history of nose bleeds.   RESPIRATORY: No cough, wheezing and shortness of breath.   CARDIOVASCULAR: Negative for chest pain, and palpitations.   GI: Negative for abdominal discomfort, blood in stools or black stools.  No recent change in bowel habits.   GU:  No history of incontinence.   MUSCLOSKELETAL: No history of joint pain or swelling.  No myalgias.   SKIN: Negative for lesions, rash, and itching.   HEMATOLOGY/ONCOLOGY: Negative for prolonged bleeding, bruising easily, and swollen nodes.  No history of cancer.   ENDOCRINE: Negative for cold or heat intolerance, polydipsia or goiter.   PSYCH:  No depression or anxiety symptoms.   NEURO: As Above.   Vital Signs:  Ht  (1.803 m)    Wt 211 lb (95.7 kg)    BMI 29.43 kg/m    General Medical Exam:  Well appearing, comfortable.  Nonlabored breathing.  No deformity or edema.  No rash.  Neurological Exam: MENTAL STATUS including orientation to time, place, person, recent and remote memory, attention span and concentration, language, and fund of knowledge is normal.  Speech  is not dysarthric.  CRANIAL NERVES:  Normal conjugate, extra-ocular eye movements in all directions of gaze.  No ptosis.  Normal facial symmetry and movements.  Normal shoulder shrug and head rotation.  Tongue is midline.  MOTOR:  Antigravity in all extremities.  No abnormal movements.  No pronator drift.    SENSORY: Negative Romberg's sign absent.   COORDINATION/GAIT: Normal finger to nose bilaterally.  Intact rapid alternating movements bilaterally.  Able to rise from a chair without using arms.  Gait narrow based and stable. Stressed gait intact.    IMPRESSION: Painful diabetic peripheral neuropathy affecting stocking glove distribution, longstanding and well-controlled.  Current dose of Lyrica  qhs is subtherapeutic. Increase Lyrica to 75 mg twice daily over the weekend to be sure there are no cognitive side effects.  If he feels lightheaded or groggy during the day, he will call my office on Monday for new prescription for Lyrica 100 mg at bedtime only.  He can also use over-the-counter lidocaine 4% cream to feet 2-3 times daily as needed.  Consider Cymbalta going forward. Patient educated on daily foot inspection and is compliant with this Fall precautions discussed   Follow Up Instructions:  I discussed the assessment and treatment plan with the patient. The patient was provided an opportunity to ask questions and all were answered. The patient agreed with the plan and demonstrated an understanding of the instructions.   The patient was advised to call back or seek an in-person evaluation if the symptoms worsen or if the condition fails to improve as anticipated.  Return to clinic in 3 months   [place]endale Chard, DO

## 2018-12-04 ENCOUNTER — Other Ambulatory Visit: Payer: Self-pay | Admitting: Endocrinology

## 2018-12-10 ENCOUNTER — Telehealth: Payer: Self-pay | Admitting: Neurology

## 2018-12-10 MED ORDER — PREGABALIN 100 MG PO CAPS: 100.0000 mg | ORAL_CAPSULE | Freq: Two times a day (BID) | ORAL | 5 refills | Status: DC

## 2018-12-10 NOTE — Telephone Encounter (Signed)
Pt informed. He will call if he notices any side effects.

## 2018-12-10 NOTE — Telephone Encounter (Signed)
[place] working. Please call him back at [phone] and let him know what to do moving forward. He said something about getting something stronger. Thanks!

## 2018-12-10 NOTE — Telephone Encounter (Signed)
This is part of his diabetic neuropathy.  I will send a prescription for Lyrica 100mg  - take 1 tablet twice daily.  Let us know, if it makes him too sleepy.

## 2018-12-10 NOTE — Telephone Encounter (Signed)
Pt c/o numbness in hands/feet and comes up to legs and knees. States that it may just be weather.  Taking Lyrica BID.  Feet also tingle. When walking feels like he is on pins and needles.

## 2018-12-20 ENCOUNTER — Other Ambulatory Visit: Payer: Self-pay | Admitting: Endocrinology

## 2018-12-23 ENCOUNTER — Other Ambulatory Visit: Payer: Self-pay | Admitting: Endocrinology

## 2019-01-06 ENCOUNTER — Other Ambulatory Visit: Payer: Self-pay | Admitting: Endocrinology

## 2019-01-14 ENCOUNTER — Other Ambulatory Visit: Payer: Self-pay

## 2019-01-14 ENCOUNTER — Other Ambulatory Visit (INDEPENDENT_AMBULATORY_CARE_PROVIDER_SITE_OTHER): Payer: BC Managed Care – PPO

## 2019-01-14 DIAGNOSIS — E1142 Type 2 diabetes mellitus with diabetic polyneuropathy: Secondary | ICD-10-CM

## 2019-01-14 LAB — BASIC METABOLIC PANEL
BUN: 15 mg/dL (ref 6–23)
CO2: 27 mEq/L (ref 19–32)
Calcium: 9.1 mg/dL (ref 8.4–10.5)
Chloride: 103 mEq/L (ref 96–112)
Creatinine, Ser: 0.9 mg/dL (ref 0.40–1.50)
GFR: 86.07 mL/min (ref >60.00)
Glucose, Bld: 119 mg/dL — ABNORMAL HIGH (ref 70–99)
Potassium: 4.3 mEq/L (ref 3.5–5.1)
Sodium: 138 mEq/L (ref 135–145)

## 2019-01-14 LAB — HEMOGLOBIN A1C: Hgb A1c MFr Bld: 6.2 % (ref 4.6–6.5)

## 2019-01-16 ENCOUNTER — Ambulatory Visit (INDEPENDENT_AMBULATORY_CARE_PROVIDER_SITE_OTHER): Payer: BC Managed Care – PPO | Admitting: Endocrinology

## 2019-01-16 ENCOUNTER — Other Ambulatory Visit: Payer: Self-pay

## 2019-01-16 ENCOUNTER — Encounter: Payer: Self-pay | Admitting: Endocrinology

## 2019-01-16 DIAGNOSIS — E1142 Type 2 diabetes mellitus with diabetic polyneuropathy: Secondary | ICD-10-CM

## 2019-01-16 NOTE — Progress Notes (Signed)
Patient ID: Danny [name], male   DOB: 08/02/1960, 58 y.o.   MRN: [id number]   Today's office visit was provided via telemedicine using video technique The patient was explained the limitations of evaluation and management by telemedicine and the availability of in person appointments.  The patient understood the limitations and agreed to proceed. Patient also understood that the telehealth visit is billable. . Location of the patient: Patient's home . Location of the provider: Physician office Only the patient and myself were participating in the encounter    Reason for Appointment: Followup for Type 2 Diabetes  Referring physician: Drue Novel  History of Present Illness:          Diagnosis: Type 2 diabetes mellitus, date of diagnosis: 1992        Past history:  He has had long-standing diabetes and previously was treated with metformin, Amaryl, Januvia and Actos He was taken off metformin because of fear of liver problems.  May have been taken taken off Actos because of swelling of his legs but was on 45 mg at that time Over the last 2 years at least his A1c has been consistently over 8% He was started on insulin in 2011 probably when his A1c was over 9% Initially was given Lantus and Humalog and subsequently switched to 70/30 twice a day for unknown reasons Has not taken Byetta or Victoza in the past He had been on 70/30 insulin initially for a few years Previously A1c was 8% or more consistently before starting Invokamet. He was switched from Lantus to [place] in 1/17  Recent history:   INSULIN regimen is described as:  Tresiba 38 units at 9 pm Novolog 8 units before breakfast, 10/12 ac supper  Oral hypoglycemic drugs the patient is taking are: Xigduo, 10/998, 1 tablet twice a day  His A1c has been usually in the upper normal range and about the same at 6.2    Current management, blood sugar patterns and problems identified:  His blood sugars for the last week  or so were reviewed on history his meter readings  He has not needed to change his insulin dose even though he has been more active this summer  However appears to have lost about 5 pounds since last visit in March  Again as before he is taking mealtime insulin mostly at breakfast and dinnertime and not at lunch  With this his blood sugars are generally fairly consistent with only sporadic high readings based on his diet  As before morning sugars may be occasionally higher but not consistently  No hypoglycemia reported  Has been compliant with his Lipitor twice a day  He does have a few low normal readings after dinner he may be still active in the evenings but does not adjust his insulin accordingly  He thinks he is usually trying to eat healthy meals and occasionally eating out     Has not used the freestyle libre because of cost as well as sometimes the sensor falling off     Side effects from medications have been: None  Compliance with the medical regimen: Good  Glucose monitoring:  done 3-4 times a day         Glucometer: One Touch Verio.      Blood Glucose readings  from download:   PRE-MEAL Fasting Lunch Dinner Bedtime Overall  Glucose range:  100-155  96-124  88-191  114-191   Mean/median:        POST-MEAL PC Breakfast PC  Lunch PC Dinner  Glucose range:    83-104  Mean/median:      Previous readings:  PRE-MEAL Fasting Lunch Dinner Bedtime Overall  Glucose range:  89-184  68-147     Mean/median:  125  104  103  112   POST-MEAL PC Breakfast PC Lunch PC Dinner  Glucose range:    76-175  Mean/median:    113     Glycemic control:   Lab Results  Component Value Date   HGBA1C 6.2 01/14/2019   HGBA1C 6.4 09/16/2018   HGBA1C 6.2 05/17/2018   Lab Results  Component Value Date   MICROALBUR <0.7 09/16/2018   LDLCALC 52 03/05/2017   CREATININE 0.90 01/14/2019    Retinal exam: annual    Self-care:  Meals: 3 meals per day. Breakfast is eggs or cereal  usually,  controlling portions      Dietician visit: Most recent: years ago.    Exercise:  he is active with moving equipment at work and some walking, does drive a truck                      Weight history:  Wt Readings from Last 3 Encounters:  11/27/18 211 lb (95.7 kg)  11/22/18 211 lb (95.7 kg)  09/18/18 215 lb 9.6 oz (97.8 kg)    Lab on 01/14/2019  Component Date Value Ref Range Status  . Sodium 01/14/2019 138  135 - 145 mEq/L Final  . Potassium 01/14/2019 4.3  3.5 - 5.1 mEq/L Final  . Chloride 01/14/2019 103  96 - 112 mEq/L Final  . CO2 01/14/2019 27  19 - 32 mEq/L Final  . Glucose, Bld 01/14/2019 119* 70 - 99 mg/dL Final  . BUN 19/14/7829 15  6 - 23 mg/dL Final  . Creatinine, Ser 01/14/2019 0.90  0.40 - 1.50 mg/dL Final  . Calcium 56/21/3086 9.1  8.4 - 10.5 mg/dL Final  . GFR 57/84/6962 86.07  >60.00 mL/min Final  . Hgb A1c MFr Bld 01/14/2019 6.2  4.6 - 6.5 % Final   Glycemic Control Guidelines for People with Diabetes:Non Diabetic:  <6%Goal of Therapy: <7%Additional Action Suggested:  >8%     Allergies as of 01/16/2019   No Known Allergies     Medication List       Accurate as of January 16, 2019  8:12 AM. If you have any questions, ask your nurse or doctor.        acetaminophen 650 MG CR tablet Commonly known as: TYLENOL Take 650 mg by mouth every 8 (eight) hours as needed for pain.   azelastine 0.1 % nasal spray Commonly known as: ASTELIN PLACE 2 SPRAYS INTO BOTH NOSTRILS EVERY NIGHT AT BEDTIME AS NEEDED FOR RHINITIS   Eliquis 5 MG Tabs tablet Generic drug: apixaban TAKE 1 TABLET(5 MG) BY MOUTH TWICE DAILY   fish oil-omega-3 fatty acids 1000 MG capsule Take 2 g by mouth 2 (two) times daily.   fluticasone 50 MCG/ACT nasal spray Commonly known as: FLONASE Place 2 sprays into both nostrils daily as needed for allergies or rhinitis.   glucagon 1 MG injection Commonly known as: Glucagon Emergency Inject 1 mg into the vein once as needed.   Insulin Pen  Needle 31G X 8 MM Misc Commonly known as: B-D ULTRAFINE III SHORT PEN Use one per day   Insulin Pen Needle 32G X 4 MM Misc Commonly known as: BD Pen Needle Nano U/F Use to give insulin injects 4 times daily.  INSULIN SYRINGE .5CC/31GX5/16" 31G X 5/16" 0.5 ML Misc USE ONE SYRINGE AS DIRECTED THREE TO FOUR TIMES DAILY   loratadine 10 MG tablet Commonly known as: CLARITIN Take 10 mg by mouth daily.   NovoLOG FlexPen 100 UNIT/ML FlexPen Generic drug: insulin aspart Inject 10-12 units under the skin three times daily before meals.   OneTouch Verio test strip Generic drug: glucose blood USE 1 STRIP TO CHECK GLUCOSE 4 TIMES DAILY   OneTouch Verio test strip Generic drug: glucose blood USE 1 STRIP TO CHECK GLUCOSE 4 TIMES DAILY   OneTouch Verio test strip Generic drug: glucose blood USE 1 STRIP TO CHECK GLUCOSE 4 TIMES DAILY   pregabalin 100 MG capsule Commonly known as: Lyrica Take 1 capsule (100 mg total) by mouth 2 (two) times daily.   simvastatin 20 MG tablet Commonly known as: ZOCOR Take 0.5 tablets (10 mg total) by mouth at bedtime.   Systane Ultra PF 0.4-0.3 % Soln Generic drug: Polyethyl Glyc-Propyl Glyc PF Apply 2 drops to eye 3 (three) times daily as needed (for dry eyes).   tamsulosin 0.4 MG Caps capsule Commonly known as: FLOMAX Take 1 capsule (0.4 mg total) by mouth daily.   Evaristo Bury FlexTouch 100 UNIT/ML Sopn FlexTouch Pen Generic drug: insulin degludec INJECT 40 UNITS SUBCUTANEOUSLY AT BEDTIME   Xigduo XR 10-998 MG Tb24 Generic drug: Dapagliflozin-metFORMIN HCl ER Take 1 tablet by mouth twice daily       Allergies:  No Known Allergies  Past Medical History:  Diagnosis Date  . Allergic rhinitis   . Benign prostatic hypertrophy    s/p TUNA procedure aprx 2006,  . Carpal tunnel syndrome 06/17/2009  . Diabetes mellitus    Type II dx 1999  . 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 ED (erectile dysfunction)    After TUNA   . Elevated LFTs 2010   (-) chronic hep  panel  . History of cardiovascular stress test 8/11   Negative  . Hyperlipidemia   . HYPERSOMNIA UNSPECIFIED 12/11/2007  . Hypertension   . Obesity   . PAF (paroxysmal atrial fibrillation) (HCC) 12/21/2009   a. Failed Multaq. b. Prev on flecainide. c. s/p afib ablation 2015.    Past Surgical History:  Procedure Laterality Date  . ABLATION  03-24-14   PVI by Dr Johney Frame  . ATRIAL FIBRILLATION ABLATION N/A 03/24/2014   Procedure: ATRIAL FIBRILLATION ABLATION;  Surgeon: Gardiner Rhyme, MD;  Location: [facility] CATH [facility];  Service: Cardiovascular;  Laterality: N/A;  . CYSTOSCOPY  11-09   neg  @ urology, was rx Flomax  . PROSTATE SURGERY     TUNA  . TEE WITHOUT CARDIOVERSION N/A 03/23/2014   Procedure: TRANSESOPHAGEAL ECHOCARDIOGRAM (TEE);  Surgeon: Pricilla Riffle, MD;  Location: [facility] ENDOSCOPY;  Service: Cardiovascular;  Laterality: N/A;  . VASECTOMY     several years ago    Family History  Problem Relation Age of Onset  . Heart disease Mother   . Diabetes Mother   . Hypertension Mother   . Congestive Heart Failure Mother   . Heart attack Mother   . Hypertension Father   . Colon cancer Neg Hx   . Prostate cancer Neg Hx     Social History:  reports that he quit smoking about 33 years ago. His smoking use included cigarettes. He has never used smokeless tobacco. He reports that he does not drink alcohol or use drugs.    Review of Systems  Lipids: He has been on low dose simvastatin for a few years, Followed by PCP Triglycerides still relatively high despite taking some fish oil OTC  He is going to be having labs done in 9/20 with his PCP      Lab Results  Component Value Date   CHOL 116 03/06/2018   HDL 37.80 (L) 03/06/2018   LDLCALC 52 03/05/2017   LDLDIRECT 68.0 03/06/2018   TRIG 246.0 (H) 03/06/2018   CHOLHDL 3 03/06/2018               No history of hypertension His microalbumin has been normal, he is taking [place] Not  monitoring at home  BP Readings from Last 3 Encounters:  11/22/18 (!) 147/81  09/18/18 124/60  05/20/18 130/70          For several years has a history of Numbness, pins-and-needles in feet and lower legs He has relief with 150 mg Lyrica at bedtime prescribed by his PCP.  This is is controlling his nocturnal symptoms   LABS:  Lab on 01/14/2019  Component Date Value Ref Range Status  . Sodium 01/14/2019 138  135 - 145 mEq/L Final  . Potassium 01/14/2019 4.3  3.5 - 5.1 mEq/L Final  . Chloride 01/14/2019 103  96 - 112 mEq/L Final  . CO2 01/14/2019 27  19 - 32 mEq/L Final  . Glucose, Bld 01/14/2019 119* 70 - 99 mg/dL Final  . BUN 16/03/9603 15  6 - 23 mg/dL Final  . Creatinine, Ser 01/14/2019 0.90  0.40 - 1.50 mg/dL Final  . Calcium 54/02/8118 9.1  8.4 - 10.5 mg/dL Final  . GFR 14/78/2956 86.07  >60.00 mL/min Final  . Hgb A1c MFr Bld 01/14/2019 6.2  4.6 - 6.5 % Final   Glycemic Control Guidelines for People with Diabetes:Non Diabetic:  <6%Goal of Therapy: <7%Additional Action Suggested:  >8%     Physical Examination:  There were no vitals taken for this visit.  ASSESSMENT/PLAN:     Diabetes type 2  with BMI 29 on insulin  See history of present illness for detailed discussion of current diabetes management, blood sugar patterns and problems identified  His A1c is fairly good with A1c 6.2 and about the same  He is on basal bolus insulin with Xigduo Overall taking relatively small doses of mealtime insulin  His overall control is excellent with his blood sugars being checked 3-4 times a day and only mild variability No hypoglycemia As before he has been active which improved his weight and sugar control in summertime  Currently no consistent pattern seen on his blood sugars However he will tend to have low normal sugars in the evenings at times based on his diet and activity level Discussed that he can cut down his evening dose 2 to 3 units if eating a lighter meal or  if planning to be more active supper  No change in [place] or residual insulin needed  LIPIDS to be followed by his PCP   Follow-up in 4 months  There are no Patient Instructions on file for this visit.    Reather Littler 01/16/2019, 8:12 AM   Note: This office note was prepared with Dragon voice recognition system technology. Any transcriptional errors that result from this process are unintentional. .

## 2019-01-20 ENCOUNTER — Other Ambulatory Visit: Payer: Self-pay | Admitting: Internal Medicine

## 2019-01-20 MED ORDER — SIMVASTATIN 20 MG PO TABS: 10.0000 mg | ORAL_TABLET | Freq: Every day | ORAL | 1 refills | Status: DC

## 2019-01-20 NOTE — Telephone Encounter (Signed)
Rx sent 

## 2019-01-20 NOTE — Telephone Encounter (Signed)
Medication Refill - Medication:  simvastatin (ZOCOR) 20 MG tablet  Has the patient contacted their pharmacy? Yes advised to call PCP  Preferred Pharmacy (with phone number or street name):  [facility] [place], [place] [phone] (Phone) [phone] (Fax)   Agent: Please be advised that RX refills may take up to 3 business days. We ask that you follow-up with your pharmacy.

## 2019-01-23 ENCOUNTER — Other Ambulatory Visit: Payer: Self-pay | Admitting: Endocrinology

## 2019-01-29 ENCOUNTER — Other Ambulatory Visit: Payer: Self-pay | Admitting: Endocrinology

## 2019-02-17 ENCOUNTER — Other Ambulatory Visit: Payer: Self-pay | Admitting: Endocrinology

## 2019-02-25 ENCOUNTER — Other Ambulatory Visit: Payer: Self-pay | Admitting: Endocrinology

## 2019-03-06 ENCOUNTER — Other Ambulatory Visit: Payer: Self-pay

## 2019-03-07 ENCOUNTER — Encounter: Payer: Self-pay | Admitting: Neurology

## 2019-03-07 ENCOUNTER — Ambulatory Visit (INDEPENDENT_AMBULATORY_CARE_PROVIDER_SITE_OTHER): Payer: BC Managed Care – PPO | Admitting: Neurology

## 2019-03-07 DIAGNOSIS — Z5329 Procedure and treatment not carried out because of patient's decision for other reasons: Secondary | ICD-10-CM

## 2019-03-09 ENCOUNTER — Other Ambulatory Visit: Payer: Self-pay | Admitting: Endocrinology

## 2019-03-10 ENCOUNTER — Other Ambulatory Visit: Payer: Self-pay | Admitting: Internal Medicine

## 2019-03-10 NOTE — Telephone Encounter (Signed)
Eliquis 5mg  refill request received; pt is 58 yrs old, weight-97.8kg, Crea-0.90 on 01/14/2019, Diagnosis-AFIB, and last seen by Dr. Rayann Heman  On 11/22/2018. Dose is appropriate based on dosing criteria. Will send in refill to requested pharmacy.

## 2019-03-10 NOTE — Progress Notes (Signed)
Rescheduled appointment

## 2019-03-11 ENCOUNTER — Ambulatory Visit (INDEPENDENT_AMBULATORY_CARE_PROVIDER_SITE_OTHER): Payer: BC Managed Care – PPO | Admitting: Internal Medicine

## 2019-03-11 ENCOUNTER — Encounter: Payer: Self-pay | Admitting: Internal Medicine

## 2019-03-11 ENCOUNTER — Other Ambulatory Visit: Payer: Self-pay

## 2019-03-11 VITALS — BP 134/69 | HR 52 | Temp 96.9°F | Resp 16 | Ht 71.0 in | Wt 206.0 lb

## 2019-03-11 DIAGNOSIS — Z0000 Encounter for general adult medical examination without abnormal findings: Secondary | ICD-10-CM

## 2019-03-11 DIAGNOSIS — E1142 Type 2 diabetes mellitus with diabetic polyneuropathy: Secondary | ICD-10-CM

## 2019-03-11 DIAGNOSIS — E785 Hyperlipidemia, unspecified: Secondary | ICD-10-CM

## 2019-03-11 LAB — CBC WITH DIFFERENTIAL/PLATELET
Basophils Absolute: 0 10*3/uL (ref 0.0–0.1)
Basophils Relative: 0.6 % (ref 0.0–3.0)
Eosinophils Absolute: 0.4 10*3/uL (ref 0.0–0.7)
Eosinophils Relative: 5.8 % — ABNORMAL HIGH (ref 0.0–5.0)
HCT: 49.2 % (ref 39.0–52.0)
Hemoglobin: 16.3 g/dL (ref 13.0–17.0)
Lymphocytes Relative: 29 % (ref 12.0–46.0)
Lymphs Abs: 2 10*3/uL (ref 0.7–4.0)
MCHC: 33.2 g/dL (ref 30.0–36.0)
MCV: 83.5 fl (ref 78.0–100.0)
Monocytes Absolute: 0.6 10*3/uL (ref 0.1–1.0)
Monocytes Relative: 8.7 % (ref 3.0–12.0)
Neutro Abs: 3.8 10*3/uL (ref 1.4–7.7)
Neutrophils Relative %: 55.9 % (ref 43.0–77.0)
Platelets: 252 10*3/uL (ref 150.0–400.0)
RBC: 5.9 Mil/uL — ABNORMAL HIGH (ref 4.22–5.81)
RDW: 14 % (ref 11.5–15.5)
WBC: 6.9 10*3/uL (ref 4.0–10.5)

## 2019-03-11 LAB — LIPID PANEL
Cholesterol: 108 mg/dL (ref 0–200)
HDL: 34.4 mg/dL — ABNORMAL LOW (ref >39.00)
LDL Cholesterol: 47 mg/dL (ref 0–99)
NonHDL: 73.83
Total CHOL/HDL Ratio: 3
Triglycerides: 136 mg/dL (ref 0.0–149.0)
VLDL: 27.2 mg/dL (ref 0.0–40.0)

## 2019-03-11 LAB — AST: AST: 23 U/L (ref 0–37)

## 2019-03-11 LAB — TSH: TSH: 2.47 u[IU]/mL (ref 0.35–4.50)

## 2019-03-11 LAB — ALT: ALT: 36 U/L (ref 0–53)

## 2019-03-11 NOTE — Assessment & Plan Note (Signed)
DM: Per Endo, last A1c 6.2.  Doing great. Neuropathy: Saw neurology 11/29/2018, Lyrica dose increased, Rx lidocaine 4% cream.  Overall symptoms decrease with Lyrica, is not using the lidocaine cream.  Will see neurology on follow-up soon.  They are also considering Cymbalta. HTN: BP today is very good Hyperlipidemia: On simvastatin, checking labs. Atrial fibrillation: On Eliquis. RTC 1 year

## 2019-03-11 NOTE — Progress Notes (Signed)
Subjective:    Patient ID: Danny GreenhouseFred W Rountree, male    DOB: 12/28/1960, 58 y.o.   MRN: [id number]  DOS:  03/11/2019 Type of visit - description: CPX Feeling very well.  Has no concerns.   Review of Systems No unusual symptoms  Other than above, a 14 point review of systems is negative      Past Medical History:  Diagnosis Date  . Allergic rhinitis   . Benign prostatic hypertrophy    s/p TUNA procedure aprx 2006,  . Carpal tunnel syndrome 06/17/2009  . Diabetes mellitus    Type II dx 1999  . 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 ED (erectile dysfunction)    After TUNA  . Elevated LFTs 2010   (-) chronic hep  panel  . History of cardiovascular stress test 8/11   Negative  . Hyperlipidemia   . HYPERSOMNIA UNSPECIFIED 12/11/2007  . Hypertension   . Obesity   . PAF (paroxysmal atrial fibrillation) (HCC) 12/21/2009   a. Failed Multaq. b. Prev on flecainide. c. s/p afib ablation 2015.    Past Surgical History:  Procedure Laterality Date  . ABLATION  03-24-14   PVI by Dr Johney FrameAllred  . ATRIAL FIBRILLATION ABLATION N/A 03/24/2014   Procedure: ATRIAL FIBRILLATION ABLATION;  Surgeon: Gardiner RhymeJames D Allred, MD;  Location: [facility] CATH LAB;  Service: Cardiovascular;  Laterality: N/A;  . CYSTOSCOPY  11-09   neg  @ urology, was rx Flomax  . PROSTATE SURGERY     TUNA  . TEE WITHOUT CARDIOVERSION N/A 03/23/2014   Procedure: TRANSESOPHAGEAL ECHOCARDIOGRAM (TEE);  Surgeon: Pricilla RifflePaula Ross V, MD;  [facility];  Service: Cardiovascular;  Laterality: N/A;  . VASECTOMY     several years ago   Family History  Problem Relation Age of Onset  . Heart disease Mother   . Diabetes Mother   . Hypertension Mother   . Congestive Heart Failure Mother   . Heart attack Mother   . Hypertension Father   . Colon cancer Neg Hx   . Prostate cancer Neg Hx     Social History   Socioeconomic History  . Marital status: Married    Spouse name: Not on file  . Number of children: 3   . Years of education: GED  . Highest education level: Not on file  Occupational History  . Occupation: Works for [employer] (state).  Drives a Engineer, agriculturaltractor trailer    Employer: [employer]    Comment: works for [employer] (state)  Social Needs  . Financial resource strain: Not on file  . Food insecurity    Worry: Not on file    Inability: Not on file  . Transportation needs    Medical: Not on file    Non-medical: Not on file  Tobacco Use  . Smoking status: Former Smoker    Types: Cigarettes    Quit date: 06/26/1985    Years since quitting: 33.7  . Smokeless tobacco: Never Used  Substance and Sexual Activity  . Alcohol use: No    Comment:    . Drug use: No  . Sexual activity: Not on file  Lifestyle  . Physical activity    Days per week: Not on file    Minutes per session: Not on file  . Stress: Not on file  Relationships  . Social Musicianconnections    Talks on phone: Not on file    Gets together: Not on file    Attends religious service: Not on  file    Active member of club or organization: Not on file    Attends meetings of clubs or organizations: Not on file    Relationship status: Not on file  . Intimate partner violence    Fear of current or ex partner: Not on file    Emotionally abused: Not on file    Physically abused: Not on file    Forced sexual activity: Not on file  Other Topics Concern  . Not on file  Social History Narrative   Lives with wife in a one story home.  Has 3 children.  Works for [employer].  Education: GED.   Household- pt and wife            Allergies as of 03/11/2019   No Known Allergies     Medication List       Accurate as of March 11, 2019  5:20 PM. If you have any questions, ask your nurse or doctor.        acetaminophen 650 MG CR tablet Commonly known as: TYLENOL Take 650 mg by mouth every 8 (eight) hours as needed for pain.   azelastine 0.1 % nasal spray Commonly known as: ASTELIN PLACE 2 SPRAYS INTO BOTH NOSTRILS EVERY NIGHT AT BEDTIME AS NEEDED  FOR RHINITIS   Eliquis 5 MG Tabs tablet Generic drug: apixaban TAKE 1 TABLET(5 MG) BY MOUTH TWICE DAILY   fish oil-omega-3 fatty acids 1000 MG capsule Take 2 g by mouth 2 (two) times daily.   fluticasone 50 MCG/ACT nasal spray Commonly known as: FLONASE Place 2 sprays into both nostrils daily as needed for allergies or rhinitis.   glucagon 1 MG injection Commonly known as: Glucagon Emergency Inject 1 mg into the vein once as needed.   Insulin Pen Needle 31G X 8 MM Misc Commonly known as: B-D ULTRAFINE III SHORT PEN Use one per day   Insulin Pen Needle 32G X 4 MM Misc Commonly known as: BD Pen Needle Nano U/F Use to give insulin injects 4 times daily.   INSULIN SYRINGE .5CC/31GX5/16" 31G X 5/16" 0.5 ML Misc USE ONE SYRINGE AS DIRECTED THREE TO FOUR TIMES DAILY   loratadine 10 MG tablet Commonly known as: CLARITIN Take 10 mg by mouth daily.   NovoLOG FlexPen 100 UNIT/ML FlexPen Generic drug: insulin aspart Inject 10-12 units under the skin three times daily before meals.   OneTouch Verio test strip Generic drug: glucose blood USE 1 STRIP TO CHECK GLUCOSE 4 TIMES DAILY   OneTouch Verio test strip Generic drug: glucose blood USE 1 STRIP TO CHECK GLUCOSE 4 TIMES DAILY   OneTouch Verio test strip Generic drug: glucose blood USE 1 STRIP TO CHECK GLUCOSE 4 TIMES DAILY   pregabalin 100 MG capsule Commonly known as: Lyrica Take 1 capsule (100 mg total) by mouth 2 (two) times daily.   simvastatin 20 MG tablet Commonly known as: ZOCOR Take 0.5 tablets (10 mg total) by mouth at bedtime.   Systane Ultra PF 0.4-0.3 % Soln Generic drug: Polyethyl Glyc-Propyl Glyc PF Apply 2 drops to eye 3 (three) times daily as needed (for dry eyes).   tamsulosin 0.4 MG Caps capsule Commonly known as: FLOMAX Take 1 capsule (0.4 mg total) by mouth daily.   Evaristo Bury FlexTouch 100 UNIT/ML Sopn FlexTouch Pen Generic drug: insulin degludec INJECT 40 UNITS SUBCUTANEOUSLY AT BEDTIME    Xigduo XR 10-998 MG Tb24 Generic drug: Dapagliflozin-metFORMIN HCl ER Take 1 tablet by mouth twice daily  Objective:   Physical Exam BP 134/69 (BP Location: Left Arm, Patient Position: Sitting, Cuff Size: Normal)   Pulse (!) 52   Temp (!) 96.9 F (36.1 C) (Temporal)   Resp 16   Ht 5\' 11"  (1.803 m)   Wt 206 lb (93.4 kg)   SpO2 98%   BMI 28.73 kg/m  General: Well developed, NAD, BMI noted Neck: No  thyromegaly  HEENT:  Normocephalic . Face symmetric, atraumatic Lungs:  CTA B Normal respiratory effort, no intercostal retractions, no accessory muscle use. Heart: RRR,  no murmur.  No pretibial edema bilaterally  Abdomen:  Not distended, soft, non-tender. No rebound or rigidity.   Skin: Exposed areas without rash. Not pale. Not jaundice Neurologic:  alert & oriented X3.  Speech normal, gait appropriate for age and unassisted Strength symmetric and appropriate for age.  Psych: Cognition and judgment appear intact.  Cooperative with normal attention span and concentration.  Behavior appropriate. No anxious or depressed appearing.     Assessment      Assessment DM , Dr Dwyane Dee Neuropathy due to DM: NCV 2016: + Peripheral neuropathy HTN Hyperlipidemia Atrial fibrillation, 2011, s/p ablation, now sinus rhythm, anticoagulated BPH s/p TUNA ED S/P shingles, postherpetic neuralgia R foot TIA (?) 10-2016, saw neuro, Rx carotid US: L ICA 1-39% otherwise (-)  PLAN DM: Per Endo, last A1c 6.2.  Doing great. Neuropathy: Saw neurology 11/29/2018, Lyrica dose increased, Rx lidocaine 4% cream.  Overall symptoms decrease with Lyrica, is not using the lidocaine cream.  Will see neurology on follow-up soon.  They are also considering Cymbalta. HTN: BP today is very good Hyperlipidemia: On simvastatin, checking labs. Atrial fibrillation: On Eliquis. RTC 1 year

## 2019-03-11 NOTE — Progress Notes (Signed)
Pre visit review using our clinic review tool, if applicable. No additional management support is needed unless otherwise documented below in the visit note. 

## 2019-03-11 NOTE — Patient Instructions (Addendum)
GO TO THE LAB : Get the blood work     GO TO THE FRONT DESK Schedule your next appointment   for a physical exam in 1 year 

## 2019-03-11 NOTE — Assessment & Plan Note (Addendum)
-  Tdap 2012 - pnm shot 2015 -  prevnar 07-2015 - s/p Shingrix x2 - flu shot at work -CCS:  Cscope Dr Lyndel Safe in [place] : 2009 hyperplastic polyps.  Colonoscopy 11-2012  Colonoscopy 08/07/2017, no polyps, no colitis, Next 5 years (Dr. Brennan Bailey) -Prostate cancer screening, DRE/ PSA were normal in 2019. -Labs: FLP, AST, ALT, CBC, TSH -Diet-exercise - discussed

## 2019-03-12 ENCOUNTER — Encounter: Payer: Self-pay | Admitting: Neurology

## 2019-03-20 NOTE — Progress Notes (Signed)
Follow-up Visit   Date: 03/21/19   Danny [name] MRN: [id number] DOB: Jul 08, 1960   Interim History: Danny [name] is a 58 y.o. rigjt-handed Caucasian male with well-controlled diabetes mellitus complicated by neuropathy, atrial fibrillation s/p ablation, hypertension and hyperlipidemia returning to the clinic for follow-up of bilateral leg numbness.  The patient was accompanied to the clinic by self.  History of present illness: He has been diabetic for about 30-years and began having burning, stinging, stabbing, pain along with numbness in the feet.  Over the years, his neuropathy has progressed from his feet and into his lower legs and calf region.  Painful paresthesias are constant with no alleviating or exacerbating factors.  He has intermittent numbness and tingling of the hands.  He denies any weakness of the hands or feet.  He does have imbalance, which is worse on uneven ground.  Fortunately, he has not had any falls and walks unassisted.  He has previously been on gabapentin (no benefit) and and currently takes Lyrica 75 mg at bedtime only.  He is cautious about taking daytime dose due to job which requires driving.  No history of alcohol use or family history of neuropathy.   UPDATE 03/21/2019:  He is here for follow-up visit.  At his last visit, I increase Lyrica to 100 mg twice daily, which has helped the pain in his feet. However, over the past few weeks, he has noticed increased numbness in the left leg from the knee down into the foot.  He also complains of a lot of painful leg and foot cramps.  He does have chronic low back pain.  Upon further questioning, he does report to having to take breaks with prolonged walking to help alleviate his leg pain.  He has mild imbalance but walks unassisted.  He suffered one fall off of a ladder a few weeks ago, fortunately, he did not hurt himself.  Medications:  Current Outpatient Medications on File Prior to Visit  Medication Sig  Dispense Refill  . acetaminophen (TYLENOL) 650 MG CR tablet Take 650 mg by mouth every 8 (eight) hours as needed for pain.    Marland Kitchen azelastine (ASTELIN) 0.1 % nasal spray PLACE 2 SPRAYS INTO BOTH NOSTRILS EVERY NIGHT AT BEDTIME AS NEEDED FOR RHINITIS 30 mL 0  . ELIQUIS 5 MG TABS tablet TAKE 1 TABLET(5 MG) BY MOUTH TWICE DAILY 60 tablet 9  . fish oil-omega-3 fatty acids 1000 MG capsule Take 2 g by mouth 2 (two) times daily.      . fluticasone (FLONASE) 50 MCG/ACT nasal spray Place 2 sprays into both nostrils daily as needed for allergies or rhinitis. 16 g 5  . glucagon (GLUCAGON EMERGENCY) 1 MG injection Inject 1 mg into the vein once as needed. 1 each 5  . Insulin Pen Needle (B-D ULTRAFINE III SHORT PEN) 31G X 8 MM MISC Use one per day 30 each 3  . Insulin Pen Needle (BD PEN NEEDLE NANO U/F) 32G X 4 MM MISC Use to give insulin injects 4 times daily. 150 each 3  . Insulin Syringe-Needle U-100 (INSULIN SYRINGE .5CC/31GX5/16") 31G X 5/16" 0.5 ML MISC USE ONE SYRINGE AS DIRECTED THREE TO FOUR TIMES DAILY 100 each 3  . loratadine (CLARITIN) 10 MG tablet Take 10 mg by mouth daily.     Marland Kitchen NOVOLOG FLEXPEN 100 UNIT/ML FlexPen Inject 10-12 units under the skin three times daily before meals. 11 pen 3  . ONETOUCH VERIO test strip USE 1 STRIP TO  CHECK GLUCOSE 4 TIMES DAILY 150 each 1  . ONETOUCH VERIO test strip USE 1 STRIP TO CHECK GLUCOSE 4 TIMES DAILY 150 each 2  . ONETOUCH VERIO test strip USE 1 STRIP TO CHECK GLUCOSE 4 TIMES DAILY 150 each 3  . Polyethyl Glyc-Propyl Glyc PF (SYSTANE ULTRA PF) 0.4-0.3 % SOLN Apply 2 drops to eye 3 (three) times daily as needed (for dry eyes).    . pregabalin (LYRICA) 100 MG capsule Take 1 capsule (100 mg total) by mouth 2 (two) times daily. 60 capsule 5  . simvastatin (ZOCOR) 20 MG tablet Take 0.5 tablets (10 mg total) by mouth at bedtime. 45 tablet 1  . tamsulosin (FLOMAX) 0.4 MG CAPS capsule Take 1 capsule (0.4 mg total) by mouth daily. 90 capsule 3  . TRESIBA FLEXTOUCH 100  UNIT/ML SOPN FlexTouch Pen INJECT 40 UNITS SUBCUTANEOUSLY AT BEDTIME 15 mL 1  . XIGDUO XR 10-998 MG TB24 Take 1 tablet by mouth twice daily 60 tablet 0   No current facility-administered medications on file prior to visit.     Allergies: No Known Allergies  Review of Systems:  CONSTITUTIONAL: No fevers, chills, night sweats, or weight loss.  EYES: No visual changes or eye pain ENT: No hearing changes.  No history of nose bleeds.   RESPIRATORY: No cough, wheezing and shortness of breath.   CARDIOVASCULAR: Negative for chest pain, and palpitations.   GI: Negative for abdominal discomfort, blood in stools or black stools.  No recent change in bowel habits.   GU:  No history of incontinence.   MUSCLOSKELETAL: No history of joint pain or swelling.  No myalgias.   SKIN: Negative for lesions, rash, and itching.   ENDOCRINE: Negative for cold or heat intolerance, polydipsia or goiter.   PSYCH:  No depression or anxiety symptoms.   NEURO: As Above.   Vital Signs:  BP 120/72   Pulse 75   Ht 5\' 11"  (1.803 m)   Wt 210 lb (95.3 kg)   SpO2 98%   BMI 29.29 kg/m    General Medical Exam:   General:  Well appearing, comfortable  Eyes/ENT: see cranial nerve examination.   Neck:  No carotid bruits. Respiratory:  Clear to auscultation, good air entry bilaterally.   Cardiac:  Regular rate and rhythm, no murmur.   Ext:  No edema   Neurological Exam: MENTAL STATUS including orientation to time, place, person, recent and remote memory, attention span and concentration, language, and fund of knowledge is normal.  Speech is not dysarthric.  CRANIAL NERVES:  No visual field defects.  Pupils equal round and reactive to light.  Normal conjugate, extra-ocular eye movements in all directions of gaze.  No ptosis.  Face is symmetric. Palate elevates symmetrically.  Tongue is midline.  MOTOR:  Motor strength is 5/5 in all extremities, including distally in the feet.  No atrophy, fasciculations or abnormal  movements.  No pronator drift.  Tone is normal.    MSRs:                                           Right        Left brachioradialis 2+  2+  biceps 2+  2+  triceps 2+  2+  patellar 3+  3+  ankle jerk 2+  2+  Hoffman no  no  plantar response down  down   SENSORY: Vibration  is reduced distal to ankles bilaterally, worse on the left.  Pinprick and temperature is intact throughout.  Romberg testing is negative.  COORDINATION/GAIT:  Normal finger-to- nose-finger.  Intact rapid alternating movements bilaterally.  Gait narrow based and stable.   Data: Lab Results  Component Value Date   TSH 2.47 03/11/2019   Lab Results  Component Value Date   VITAMINB12 344 10/26/2016     IMPRESSION/PLAN: 1.  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  Exam shows brisk reflexes in the legs, which normally should be reduced in patients with neuropathy.  - MRI lumbar spine wo contrast  - Start flexeril 5mg  at bedtime  - Start physical therapy for low back pain and leg stretching  2. Stocking-glove painful diabetic peripheral neuropathy, longstanding.  - He is taking Lyrica 100mg  BID which will be continued  Further recommendations pending results.   Thank you for allowing me to participate in patient's care.  If I can answer any additional questions, I would be pleased to do so.    Sincerely,     K. Allena KatzPatel, DO

## 2019-03-21 ENCOUNTER — Ambulatory Visit (INDEPENDENT_AMBULATORY_CARE_PROVIDER_SITE_OTHER): Payer: BC Managed Care – PPO | Admitting: Neurology

## 2019-03-21 ENCOUNTER — Encounter: Payer: Self-pay | Admitting: Neurology

## 2019-03-21 ENCOUNTER — Other Ambulatory Visit: Payer: Self-pay

## 2019-03-21 VITALS — BP 120/72 | HR 75 | Ht 71.0 in | Wt 210.0 lb

## 2019-03-21 DIAGNOSIS — M48062 Spinal stenosis, lumbar region with neurogenic claudication: Secondary | ICD-10-CM

## 2019-03-21 DIAGNOSIS — E1142 Type 2 diabetes mellitus with diabetic polyneuropathy: Secondary | ICD-10-CM | POA: Diagnosis not present

## 2019-03-21 DIAGNOSIS — R292 Abnormal reflex: Secondary | ICD-10-CM

## 2019-03-21 MED ORDER — CYCLOBENZAPRINE HCL 5 MG PO TABS: 5.0000 mg | ORAL_TABLET | Freq: Every day | ORAL | 3 refills | Status: DC

## 2019-03-21 NOTE — Patient Instructions (Addendum)
MRI lumbar spine without contrast.  I will call you with the results We have sent a referral to [facility] for your MRI and they will call you directly to schedule your appointment. They are located at [place]. If you need to contact them directly please call [phone]. Start physical therapy for low back pain and leg stretching  Start flexeril 5mg  at bedtime. Side effects include sleepiness, so monitor closely when driving

## 2019-03-25 ENCOUNTER — Other Ambulatory Visit: Payer: Self-pay | Admitting: Endocrinology

## 2019-03-26 ENCOUNTER — Encounter: Payer: BC Managed Care – PPO | Admitting: Internal Medicine

## 2019-04-03 ENCOUNTER — Other Ambulatory Visit: Payer: BC Managed Care – PPO

## 2019-04-08 ENCOUNTER — Other Ambulatory Visit: Payer: Self-pay | Admitting: Internal Medicine

## 2019-04-10 ENCOUNTER — Other Ambulatory Visit: Payer: Self-pay

## 2019-04-10 ENCOUNTER — Ambulatory Visit
Admission: RE | Admit: 2019-04-10 | Discharge: 2019-04-10 | Disposition: A | Discharge status: Home / Self Care | Payer: BC Managed Care – PPO | Source: Ambulatory Visit | Attending: Neurology | Admitting: Neurology

## 2019-04-10 ENCOUNTER — Telehealth: Payer: Self-pay

## 2019-04-10 DIAGNOSIS — E1142 Type 2 diabetes mellitus with diabetic polyneuropathy: Secondary | ICD-10-CM

## 2019-04-10 DIAGNOSIS — M48062 Spinal stenosis, lumbar region with neurogenic claudication: Secondary | ICD-10-CM

## 2019-04-10 NOTE — Telephone Encounter (Signed)
Patient notified of results of MRI, pt will get back in touch with Dr.Patel after he talks with his wife.

## 2019-04-10 NOTE — Telephone Encounter (Signed)
-----   Message from Alda Berthold, DO sent at 04/10/2019  1:51 PM EDT ----- Please inform patient that his MRI lumbar spine looks very good and does not show any nerve impingement which would be causing his pain and numbness into the legs.  The next step is looking at circulation to the legs.  Please order ABI if patient agreeable. Thanks.

## 2019-04-21 ENCOUNTER — Other Ambulatory Visit: Payer: Self-pay

## 2019-04-21 ENCOUNTER — Ambulatory Visit (INDEPENDENT_AMBULATORY_CARE_PROVIDER_SITE_OTHER): Payer: BC Managed Care – PPO | Admitting: Family

## 2019-04-21 DIAGNOSIS — Z20822 Contact with and (suspected) exposure to covid-19: Secondary | ICD-10-CM

## 2019-04-21 DIAGNOSIS — Z20828 Contact with and (suspected) exposure to other viral communicable diseases: Secondary | ICD-10-CM

## 2019-04-21 NOTE — Progress Notes (Signed)
Virtual Visit via Video Note  I connected with Danny GreenhouseFred W Leffler on 04/21/19 at  9:40 AM EDT by a video enabled telemedicine application and verified that I am speaking with the correct person using two identifiers.  Location: Patient: work Provider: home   I discussed the limitations of evaluation and management by telemedicine and the availability of in person appointments. The patient expressed understanding and agreed to proceed.  History of Present Illness:  Reports that he works for [employer]. 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  Coworker tested positive last Monday. [name] was told that they were not testing asymptomatic patients.  He reports that he had an exposure to a coworker who tested positive last Monday. Coworker was wearing a mask but the Danny [name] was not wearing a mask. Danny [name] denies any covid symptoms including: loss of taste/smell, fever, myalgia, headache, sore throat, fatigue.   Past Medical History:  Diagnosis Date  . Allergic rhinitis   . Benign prostatic hypertrophy    s/p TUNA procedure aprx 2006,  . Carpal tunnel syndrome 06/17/2009  . Diabetes mellitus    Type II dx 1999  . 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 ED (erectile dysfunction)    After TUNA  . Elevated LFTs 2010   (-) chronic hep  panel  . History of cardiovascular stress test 8/11   Negative  . Hyperlipidemia   . HYPERSOMNIA UNSPECIFIED 12/11/2007  . Hypertension   . Obesity   . PAF (paroxysmal atrial fibrillation) (HCC) 12/21/2009   a. Failed Multaq. b. Prev on flecainide. c. s/p afib ablation 2015.     Social History   Socioeconomic History  . Marital status: Married    Spouse name: Not on file  . Number of children: 3  . Years of education: GED  . Highest education level: Not on file  Occupational History  . Occupation: Works for [employer] (state).  Drives a Engineer, agriculturaltractor trailer    Employer: [employer]    Comment: works for [employer]  (state)  Social Needs  . Financial resource strain: Not on file  . Food insecurity    Worry: Not on file    Inability: Not on file  . Transportation needs    Medical: Not on file    Non-medical: Not on file  Tobacco Use  . Smoking status: Former Smoker    Types: Cigarettes    Quit date: 06/26/1985    Years since quitting: 33.8  . Smokeless tobacco: Never Used  Substance and Sexual Activity  . Alcohol use: No    Comment:    . Drug use: No  . Sexual activity: Not on file  Lifestyle  . Physical activity    Days per week: Not on file    Minutes per session: Not on file  . Stress: Not on file  Relationships  . Social Musicianconnections    Talks on phone: Not on file    Gets together: Not on file    Attends religious service: Not on file    Active member of club or organization: Not on file    Attends meetings of clubs or organizations: Not on file    Relationship status: Not on file  . Intimate partner violence    Fear of current or ex partner: Not on file    Emotionally abused: Not on file    Physically abused: Not on file    Forced sexual activity:  Not on file  Other Topics Concern  . Not on file  Social History Narrative   Lives with wife in a one story   Right handed    One story home   3 children          Past Surgical History:  Procedure Laterality Date  . ABLATION  03-24-14   PVI by Dr Johney Frame  . ATRIAL FIBRILLATION ABLATION N/A 03/24/2014   Procedure: ATRIAL FIBRILLATION ABLATION;  Surgeon: Gardiner Rhyme, MD;  Location: [facility] CATH [facility];  Service: Cardiovascular;  Laterality: N/A;  . CYSTOSCOPY  11-09   neg  @ urology, was rx Flomax  . PROSTATE SURGERY     TUNA  . TEE WITHOUT CARDIOVERSION N/A 03/23/2014   Procedure: TRANSESOPHAGEAL ECHOCARDIOGRAM (TEE);  Surgeon: Pricilla Riffle, MD;  Location: [facility] ENDOSCOPY;  Service: Cardiovascular;  Laterality: N/A;  . VASECTOMY     several years ago    Family History  Problem Relation Age of Onset  . Heart disease Mother   .  Diabetes Mother   . Hypertension Mother   . Congestive Heart Failure Mother   . Heart attack Mother   . Hypertension Father   . Colon cancer Neg Hx   . Prostate cancer Neg Hx     No Known Allergies  Current Outpatient Medications on File Prior to Visit  Medication Sig Dispense Refill  . acetaminophen (TYLENOL) 650 MG CR tablet Take 650 mg by mouth every 8 (eight) hours as needed for pain.    Marland Kitchen azelastine (ASTELIN) 0.1 % nasal spray PLACE 2 SPRAYS INTO BOTH NOSTRILS EVERY NIGHT AT BEDTIME AS NEEDED FOR RHINITIS 30 mL 0  . cyclobenzaprine (FLEXERIL) 5 MG tablet Take 1 tablet (5 mg total) by mouth at bedtime. 30 tablet 3  . ELIQUIS 5 MG TABS tablet TAKE 1 TABLET(5 MG) BY MOUTH TWICE DAILY 60 tablet 9  . fish oil-omega-3 fatty acids 1000 MG capsule Take 2 g by mouth 2 (two) times daily.      . fluticasone (FLONASE) 50 MCG/ACT nasal spray Place 2 sprays into both nostrils daily as needed for allergies or rhinitis. 16 g 5  . glucagon (GLUCAGON EMERGENCY) 1 MG injection Inject 1 mg into the vein once as needed. 1 each 5  . Insulin Pen Needle (B-D ULTRAFINE III SHORT PEN) 31G X 8 MM MISC Use one per day 30 each 3  . Insulin Pen Needle (BD PEN NEEDLE NANO U/F) 32G X 4 MM MISC Use to give insulin injects 4 times daily. 150 each 3  . Insulin Syringe-Needle U-100 (INSULIN SYRINGE .5CC/31GX5/16") 31G X 5/16" 0.5 ML MISC USE ONE SYRINGE AS DIRECTED THREE TO FOUR TIMES DAILY 100 each 3  . loratadine (CLARITIN) 10 MG tablet Take 10 mg by mouth daily.     Marland Kitchen NOVOLOG FLEXPEN 100 UNIT/ML FlexPen Inject 10-12 units under the skin three times daily before meals. 11 pen 3  . ONETOUCH VERIO test strip USE 1 STRIP TO CHECK GLUCOSE 4 TIMES DAILY 150 each 1  . ONETOUCH VERIO test strip USE 1 STRIP TO CHECK GLUCOSE 4 TIMES DAILY 150 each 2  . ONETOUCH VERIO test strip USE 1 STRIP TO CHECK GLUCOSE 4 TIMES DAILY 150 each 3  . Polyethyl Glyc-Propyl Glyc PF (SYSTANE ULTRA PF) 0.4-0.3 % SOLN Apply 2 drops to eye 3  (three) times daily as needed (for dry eyes).    . pregabalin (LYRICA) 100 MG capsule Take 1 capsule (100 mg total) by mouth  2 (two) times daily. 60 capsule 5  . simvastatin (ZOCOR) 20 MG tablet Take 0.5 tablets (10 mg total) by mouth at bedtime. 45 tablet 1  . tamsulosin (FLOMAX) 0.4 MG CAPS capsule Take 1 capsule (0.4 mg total) by mouth daily. 90 capsule 3  . TRESIBA FLEXTOUCH 100 UNIT/ML SOPN FlexTouch Pen INJECT 40 UNITS SUBCUTANEOUSLY AT BEDTIME 15 mL 1  . XIGDUO XR 10-998 MG TB24 Take 1 tablet by mouth twice daily 60 tablet 0   No current facility-administered medications on file prior to visit.     There were no vitals taken for this visit.      Observations/Objective:   Gen: Awake, alert, no acute distress Resp: Breathing is even and non-labored Psych: calm/pleasant demeanor Neuro: Alert and Oriented x 3, + facial symmetry, speech is clear.   Assessment and Plan:  Covid-19 exposure- Danny [name] remains asymptomatic. We discussed importance of social distancing, mask use. He is advised to call if he develops symptoms. In the meantime, will refer for COVID-19 screening.    Follow Up Instructions:    I discussed the assessment and treatment plan with the patient. The patient was provided an opportunity to ask questions and all were answered. The patient agreed with the plan and demonstrated an understanding of the instructions.   The patient was advised to call back or seek an in-person evaluation if the symptoms worsen or if the condition fails to improve as anticipated.  Nance Pear, NP

## 2019-04-23 ENCOUNTER — Telehealth: Payer: Self-pay

## 2019-04-23 LAB — NOVEL CORONAVIRUS, NAA: SARS-CoV-2, NAA: NOT DETECTED

## 2019-04-23 NOTE — Telephone Encounter (Signed)
Copied from [place] [phone]. Topic: General - Other >> Apr 23, 2019  9:24 AM Celene Kras A wrote: Reason for CRM: Pt called and is requesting to have a note so he can return to work due to negative covid results. Please advise.

## 2019-04-23 NOTE — Telephone Encounter (Signed)
Pt had virtual visit w/ Melissa on 04/21/2019.

## 2019-04-23 NOTE — Telephone Encounter (Signed)
Patient was advised letter available on MyChart

## 2019-04-23 NOTE — Telephone Encounter (Signed)
Please advise pt that I have posted a copy of his letter to his mychart account.

## 2019-04-24 ENCOUNTER — Other Ambulatory Visit: Payer: Self-pay | Admitting: Endocrinology

## 2019-05-13 ENCOUNTER — Other Ambulatory Visit: Payer: Self-pay

## 2019-05-13 ENCOUNTER — Other Ambulatory Visit (INDEPENDENT_AMBULATORY_CARE_PROVIDER_SITE_OTHER): Payer: BC Managed Care – PPO

## 2019-05-13 ENCOUNTER — Encounter: Payer: Self-pay | Admitting: Endocrinology

## 2019-05-13 DIAGNOSIS — E1142 Type 2 diabetes mellitus with diabetic polyneuropathy: Secondary | ICD-10-CM | POA: Diagnosis not present

## 2019-05-13 LAB — BASIC METABOLIC PANEL
BUN: 14 mg/dL (ref 6–23)
CO2: 29 mEq/L (ref 19–32)
Calcium: 9.4 mg/dL (ref 8.4–10.5)
Chloride: 102 mEq/L (ref 96–112)
Creatinine, Ser: 0.82 mg/dL (ref 0.40–1.50)
GFR: 95.73 mL/min (ref >60.00)
Glucose, Bld: 129 mg/dL — ABNORMAL HIGH (ref 70–99)
Potassium: 4.4 mEq/L (ref 3.5–5.1)
Sodium: 139 mEq/L (ref 135–145)

## 2019-05-13 LAB — HEMOGLOBIN A1C: Hgb A1c MFr Bld: 6.3 % (ref 4.6–6.5)

## 2019-05-15 ENCOUNTER — Other Ambulatory Visit: Payer: Self-pay

## 2019-05-15 ENCOUNTER — Encounter: Payer: Self-pay | Admitting: Endocrinology

## 2019-05-15 ENCOUNTER — Ambulatory Visit: Payer: BC Managed Care – PPO | Admitting: Endocrinology

## 2019-05-15 VITALS — BP 136/68 | HR 61 | Ht 71.0 in | Wt 211.8 lb

## 2019-05-15 DIAGNOSIS — E1142 Type 2 diabetes mellitus with diabetic polyneuropathy: Secondary | ICD-10-CM | POA: Diagnosis not present

## 2019-05-15 NOTE — Patient Instructions (Signed)
[address]   Lyrica 2 at [employer]

## 2019-05-15 NOTE — Progress Notes (Signed)
Patient ID: Danny [name], male   DOB: September 19, 1960, 58 y.o.   MRN: [id number]    Reason for Appointment: Followup for Type 2 Diabetes  Referring physician: Drue Novel  History of Present Illness:          Diagnosis: Type 2 diabetes mellitus, date of diagnosis: 1992        Past history:  He has had long-standing diabetes and previously was treated with metformin, Amaryl, Januvia and Actos He was taken off metformin because of fear of liver problems.  May have been taken taken off Actos because of swelling of his legs but was on 45 mg at that time Over the last 2 years at least his A1c has been consistently over 8% He was started on insulin in 2011 probably when his A1c was over 9% Initially was given Lantus and Humalog and subsequently switched to 70/30 twice a day for unknown reasons Has not taken Byetta or Victoza in the past He had been on 70/30 insulin initially for a few years Previously A1c was 8% or more consistently before starting Invokamet. He was switched from Lantus to [place] in 1/17  Recent history:   INSULIN regimen is described as:  Tresiba 38 units at 9 pm Novolog 8 units before breakfast, 10/12 ac supper  Oral hypoglycemic drugs the patient is taking are: Xigduo, 10/998, 1 tablet twice a day  His A1c has been usually in the upper normal range and about the same at 6.2    Current management, blood sugar patterns and problems identified:  His blood sugars for the last few weeks have been higher in the morning  He does not know why they are higher even though his bedtime readings are excellent  Has not changed his [place] with his higher morning reading  He is compliant with his Davonna Belling and takes this twice a day  As before he has been trying to be active mostly at work with moving and lifting  Overall has been able to keep his weight down and it is slightly lower than when he was last checked  As before not taking insulin at lunchtime and only rarely  will have high readings before dinnertime  Recently no hypoglycemia also  He generally is checking his blood sugar 4 times a day      Has not used the freestyle libre because of cost as well as sometimes the sensor falling off     Side effects from medications have been: None  Compliance with the medical regimen: Good  Glucose monitoring:  done 3-4 times a day         Glucometer: One Touch Verio.      Blood Glucose readings  from download:   PRE-MEAL Fasting Lunch Dinner Bedtime Overall  Glucose range:  96-166  93-151  86-214  100-172   Mean/median:  145  118  131  124 128   Previous readings:  PRE-MEAL Fasting Lunch Dinner Bedtime Overall  Glucose range:  100-155  96-124  88-191  114-191   Mean/median:        POST-MEAL PC Breakfast PC Lunch PC Dinner  Glucose range:    83-104  Mean/median:       Glycemic control:   Lab Results  Component Value Date   HGBA1C 6.3 05/13/2019   HGBA1C 6.2 01/14/2019   HGBA1C 6.4 09/16/2018   Lab Results  Component Value Date   MICROALBUR <0.7 09/16/2018   LDLCALC 47 03/11/2019   CREATININE 0.82 05/13/2019  Retinal exam: annual    Self-care:  Meals: 3 meals per day. Breakfast is eggs or cereal usually,  controlling portions      Dietician visit: Most recent: years ago.    Exercise:  he is active with moving equipment at work and some walking, does drive a truck                      Weight history:  Wt Readings from Last 3 Encounters:  05/15/19 211 lb 12.8 oz (96.1 kg)  03/21/19 210 lb (95.3 kg)  03/11/19 206 lb (93.4 kg)    Lab on 05/13/2019  Component Date Value Ref Range Status  . Sodium 05/13/2019 139  135 - 145 mEq/L Final  . Potassium 05/13/2019 4.4  3.5 - 5.1 mEq/L Final  . Chloride 05/13/2019 102  96 - 112 mEq/L Final  . CO2 05/13/2019 29  19 - 32 mEq/L Final  . Glucose, Bld 05/13/2019 129* 70 - 99 mg/dL Final  . BUN 24/40/1027 14  6 - 23 mg/dL Final  . Creatinine, Ser 05/13/2019 0.82  0.40 - 1.50 mg/dL Final   . GFR 25/36/6440 95.73  >60.00 mL/min Final  . Calcium 05/13/2019 9.4  8.4 - 10.5 mg/dL Final  . Hgb H4V MFr Bld 05/13/2019 6.3  4.6 - 6.5 % Final   Glycemic Control Guidelines for People with Diabetes:Non Diabetic:  <6%Goal of Therapy: <7%Additional Action Suggested:  >8%     Allergies as of 05/15/2019   No Known Allergies     Medication List       Accurate as of May 15, 2019 11:46 AM. If you have any questions, ask your nurse or doctor.        acetaminophen 650 MG CR tablet Commonly known as: TYLENOL Take 650 mg by mouth every 8 (eight) hours as needed for pain.   azelastine 0.1 % nasal spray Commonly known as: ASTELIN PLACE 2 SPRAYS INTO BOTH NOSTRILS EVERY NIGHT AT BEDTIME AS NEEDED FOR RHINITIS   cyclobenzaprine 5 MG tablet Commonly known as: FLEXERIL Take 1 tablet (5 mg total) by mouth at bedtime.   Eliquis 5 MG Tabs tablet Generic drug: apixaban TAKE 1 TABLET(5 MG) BY MOUTH TWICE DAILY   fish oil-omega-3 fatty acids 1000 MG capsule Take 2 g by mouth 2 (two) times daily.   fluticasone 50 MCG/ACT nasal spray Commonly known as: FLONASE Place 2 sprays into both nostrils daily as needed for allergies or rhinitis.   glucagon 1 MG injection Commonly known as: Glucagon Emergency Inject 1 mg into the vein once as needed.   Insulin Pen Needle 31G X 8 MM Misc Commonly known as: B-D ULTRAFINE III SHORT PEN Use one per day   Insulin Pen Needle 32G X 4 MM Misc Commonly known as: BD Pen Needle Nano U/F Use to give insulin injects 4 times daily.   INSULIN SYRINGE .5CC/31GX5/16" 31G X 5/16" 0.5 ML Misc USE ONE SYRINGE AS DIRECTED THREE TO FOUR TIMES DAILY   loratadine 10 MG tablet Commonly known as: CLARITIN Take 10 mg by mouth daily.   NovoLOG FlexPen 100 UNIT/ML FlexPen Generic drug: insulin aspart Inject 10-12 units under the skin three times daily before meals.   OneTouch Verio test strip Generic drug: glucose blood USE 1 STRIP TO CHECK GLUCOSE 4  TIMES DAILY What changed: Another medication with the same name was removed. Continue taking this medication, and follow the directions you see here. Changed by: Reather Littler, MD  pregabalin 100 MG capsule Commonly known as: Lyrica Take 1 capsule (100 mg total) by mouth 2 (two) times daily.   simvastatin 20 MG tablet Commonly known as: ZOCOR Take 0.5 tablets (10 mg total) by mouth at bedtime.   Systane Ultra PF 0.4-0.3 % Soln Generic drug: Polyethyl Glyc-Propyl Glyc PF Apply 2 drops to eye 3 (three) times daily as needed (for dry eyes).   tamsulosin 0.4 MG Caps capsule Commonly known as: FLOMAX Take 1 capsule (0.4 mg total) by mouth daily.   Evaristo Bury FlexTouch 100 UNIT/ML Sopn FlexTouch Pen Generic drug: insulin degludec INJECT 40 UNITS SUBCUTANEOUSLY AT BEDTIME   Xigduo XR 10-998 MG Tb24 Generic drug: Dapagliflozin-metFORMIN HCl ER Take 1 tablet by mouth twice daily       Allergies:  No Known Allergies  Past Medical History:  Diagnosis Date  . Allergic rhinitis   . Benign prostatic hypertrophy    s/p TUNA procedure aprx 2006,  . Carpal tunnel syndrome 06/17/2009  . Diabetes mellitus    Type II dx 1999  . 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 ED (erectile dysfunction)    After TUNA  . Elevated LFTs 2010   (-) chronic hep  panel  . History of cardiovascular stress test 8/11   Negative  . Hyperlipidemia   . HYPERSOMNIA UNSPECIFIED 12/11/2007  . Hypertension   . Obesity   . PAF (paroxysmal atrial fibrillation) (HCC) 12/21/2009   a. Failed Multaq. b. Prev on flecainide. c. s/p afib ablation 2015.    Past Surgical History:  Procedure Laterality Date  . ABLATION  03-24-14   PVI by Dr Johney Frame  . ATRIAL FIBRILLATION ABLATION N/A 03/24/2014   Procedure: ATRIAL FIBRILLATION ABLATION;  Surgeon: Gardiner Rhyme, MD;  Location: [facility] CATH LAB;  Service: Cardiovascular;  Laterality: N/A;  . CYSTOSCOPY  11-09   neg  @ urology, was rx Flomax  .  PROSTATE SURGERY     TUNA  . TEE WITHOUT CARDIOVERSION N/A 03/23/2014   Procedure: TRANSESOPHAGEAL ECHOCARDIOGRAM (TEE);  Surgeon: Pricilla Riffle, MD;  Location: [facility] ENDOSCOPY;  Service: Cardiovascular;  Laterality: N/A;  . VASECTOMY     several years ago    Family History  Problem Relation Age of Onset  . Heart disease Mother   . Diabetes Mother   . Hypertension Mother   . Congestive Heart Failure Mother   . Heart attack Mother   . Hypertension Father   . Colon cancer Neg Hx   . Prostate cancer Neg Hx     Social History:  reports that he quit smoking about 33 years ago. His smoking use included cigarettes. He has never used smokeless tobacco. He reports that he does not drink alcohol or use drugs.    Review of Systems       Lipids: He has been on low dose simvastatin for a few years, Followed by PCP Triglycerides still relatively high despite taking some fish oil OTC  Labs as below:      Lab Results  Component Value Date   CHOL 108 03/11/2019   HDL 34.40 (L) 03/11/2019   LDLCALC 47 03/11/2019   LDLDIRECT 68.0 03/06/2018   TRIG 136.0 03/11/2019   CHOLHDL 3 03/11/2019               No history of hypertension His microalbumin has been normal, he is taking [place] Not monitoring at home  BP Readings from Last 3 Encounters:  05/15/19 136/68  03/21/19 120/72  03/11/19 134/69          For several years has a history of Numbness, pins-and-needles in feet and lower legs He has been seen by neurologist, now taking 100 twice daily of Lyrica Recently however he is having more pains in his lower legs starting from his feet Also sometimes has stinging sensation, mostly in the evenings and at bedtime Only mild numbness   LABS:  Lab on 05/13/2019  Component Date Value Ref Range Status  . Sodium 05/13/2019 139  135 - 145 mEq/L Final  . Potassium 05/13/2019 4.4  3.5 - 5.1 mEq/L Final  . Chloride 05/13/2019 102  96 - 112 mEq/L Final  . CO2 05/13/2019 29  19 - 32 mEq/L Final   . Glucose, Bld 05/13/2019 129* 70 - 99 mg/dL Final  . BUN 40/98/1191 14  6 - 23 mg/dL Final  . Creatinine, Ser 05/13/2019 0.82  0.40 - 1.50 mg/dL Final  . GFR 47/82/9562 95.73  >60.00 mL/min Final  . Calcium 05/13/2019 9.4  8.4 - 10.5 mg/dL Final  . Hgb Z3Y MFr Bld 05/13/2019 6.3  4.6 - 6.5 % Final   Glycemic Control Guidelines for People with Diabetes:Non Diabetic:  <6%Goal of Therapy: <7%Additional Action Suggested:  >8%     Physical Examination:  BP 136/68 (BP Location: Left Arm, Patient Position: Sitting, Cuff Size: Normal)   Pulse 61   Ht 5\' 11"  (1.803 m)   Wt 211 lb 12.8 oz (96.1 kg)   SpO2 97%   BMI 29.54 kg/m   ASSESSMENT/PLAN:   Feet are normal to inspection No decrease in monofilament sensation in the distal toes  Diabetes type 2  with BMI 29 on insulin  See history of present illness for detailed discussion of current diabetes management, blood sugar patterns and problems identified  His A1c is fairly good with A1c 6.3, previously 6.2  He is on basal bolus insulin with Xigduo as before  Fasting readings are consistently higher and not clear why He is still very active and his weight is slightly better Likely needs more basal insulin and will increase Tresiba to 42 for now If he starts having low normal sugars before dinnertime we will need to have a midafternoon snack  Also given information on Dexcom which he can check on.  This should adhere to his skin better than freestyle Josephine Igo He will call to check his coverage for this  NEUROPATHY: He has mostly pain in his feet and lower legs and occasionally paresthesia Symptoms are worsened may be partly related to higher fasting readings  He will increase his Lyrica to 300 mg a day with 2 capsules in the evenings and follow-up with neurologist Alternatively may be a candidate for adding Cymbalta  Muscle cramps at night: This is likely idiopathic, unrelated to medications or electrolyte abnormalities He will try  stretching exercises at bedtime and empirically try tonic water  Follow-up in 3 months  Patient Instructions  Tresiba 42   Lyrica 2 at nite  Counseling time on subjects discussed in assessment and plan sections is over 50% of today's 25 minute visit    Reather Littler 05/15/2019, 11:46 AM   Note: This office note was prepared with Dragon voice recognition system technology. Any transcriptional errors that result from this process are unintentional. .

## 2019-05-20 ENCOUNTER — Other Ambulatory Visit: Payer: Self-pay | Admitting: Neurology

## 2019-05-20 ENCOUNTER — Other Ambulatory Visit: Payer: Self-pay | Admitting: Endocrinology

## 2019-06-21 ENCOUNTER — Other Ambulatory Visit: Payer: Self-pay | Admitting: Endocrinology

## 2019-06-27 ENCOUNTER — Other Ambulatory Visit: Payer: Self-pay | Admitting: Endocrinology

## 2019-07-08 LAB — HM DIABETES EYE EXAM

## 2019-07-09 ENCOUNTER — Other Ambulatory Visit: Payer: Self-pay | Admitting: Endocrinology

## 2019-07-13 ENCOUNTER — Other Ambulatory Visit: Payer: Self-pay | Admitting: Internal Medicine

## 2019-07-15 ENCOUNTER — Other Ambulatory Visit: Payer: Self-pay | Admitting: Endocrinology

## 2019-08-11 ENCOUNTER — Other Ambulatory Visit: Payer: Self-pay | Admitting: Endocrinology

## 2019-08-18 ENCOUNTER — Other Ambulatory Visit (INDEPENDENT_AMBULATORY_CARE_PROVIDER_SITE_OTHER): Payer: BC Managed Care – PPO

## 2019-08-18 ENCOUNTER — Other Ambulatory Visit: Payer: Self-pay

## 2019-08-18 DIAGNOSIS — E1142 Type 2 diabetes mellitus with diabetic polyneuropathy: Secondary | ICD-10-CM

## 2019-08-18 LAB — COMPREHENSIVE METABOLIC PANEL
ALT: 42 U/L (ref 0–53)
AST: 24 U/L (ref 0–37)
Albumin: 4.5 g/dL (ref 3.5–5.2)
Alkaline Phosphatase: 72 U/L (ref 39–117)
BUN: 17 mg/dL (ref 6–23)
CO2: 27 mEq/L (ref 19–32)
Calcium: 9.7 mg/dL (ref 8.4–10.5)
Chloride: 104 mEq/L (ref 96–112)
Creatinine, Ser: 0.95 mg/dL (ref 0.40–1.50)
GFR: 80.71 mL/min (ref >60.00)
Glucose, Bld: 115 mg/dL — ABNORMAL HIGH (ref 70–99)
Potassium: 4.6 mEq/L (ref 3.5–5.1)
Sodium: 140 mEq/L (ref 135–145)
Total Bilirubin: 0.4 mg/dL (ref 0.2–1.2)
Total Protein: 7.3 g/dL (ref 6.0–8.3)

## 2019-08-18 LAB — MICROALBUMIN / CREATININE URINE RATIO
Creatinine,U: 68.5 mg/dL
Microalb Creat Ratio: 1 mg/g (ref 0.0–30.0)
Microalb, Ur: <0.7 mg/dL (ref 0.0–1.9)

## 2019-08-18 LAB — HEMOGLOBIN A1C: Hgb A1c MFr Bld: 6.3 % (ref 4.6–6.5)

## 2019-08-19 ENCOUNTER — Telehealth: Payer: Self-pay | Admitting: Internal Medicine

## 2019-08-19 NOTE — Telephone Encounter (Signed)
Per records showing Shingrix was given on 03/06/2018 and 04/30/2018.

## 2019-08-19 NOTE — Telephone Encounter (Signed)
Per patient his Shingles vaccination is printed wrong on vaccination record   First Shot Shingle 03/05/2018 Second Shingles  03/07/2018   Patient states that second shot was not given on 03/07/2018, can someone please correct

## 2019-08-21 ENCOUNTER — Other Ambulatory Visit: Payer: Self-pay

## 2019-08-21 ENCOUNTER — Ambulatory Visit (INDEPENDENT_AMBULATORY_CARE_PROVIDER_SITE_OTHER): Payer: BC Managed Care – PPO | Admitting: Endocrinology

## 2019-08-21 ENCOUNTER — Encounter: Payer: Self-pay | Admitting: Endocrinology

## 2019-08-21 DIAGNOSIS — E7800 Pure hypercholesterolemia, unspecified: Secondary | ICD-10-CM

## 2019-08-21 DIAGNOSIS — E1142 Type 2 diabetes mellitus with diabetic polyneuropathy: Secondary | ICD-10-CM

## 2019-08-21 NOTE — Progress Notes (Signed)
Patient ID: Danny [name], male   DOB: August 17, 1960, 59 y.o.   MRN: [id number]  I connected with the above-named patient by video enabled telemedicine application and verified that I am speaking with the correct person. The patient was explained the limitations of evaluation and management by telemedicine and the availability of in person appointments.  Patient also understood that there may be a patient responsible charge related to this service . Location of the patient: Patient's home . Location of the provider: Physician office Only the patient and myself were participating in the encounter The patient understood the above statements and agreed to proceed.   Reason for Appointment: Followup for Type 2 Diabetes  Referring physician: Drue Novel  History of Present Illness:          Diagnosis: Type 2 diabetes mellitus, date of diagnosis: 1992        Past history:  He has had long-standing diabetes and previously was treated with metformin, Amaryl, Januvia and Actos He was taken off metformin because of fear of liver problems.  May have been taken taken off Actos because of swelling of his legs but was on 45 mg at that time Over the last 2 years at least his A1c has been consistently over 8% He was started on insulin in 2011 probably when his A1c was over 9% Initially was given Lantus and Humalog and subsequently switched to 70/30 twice a day for unknown reasons Has not taken Byetta or Victoza in the past He had been on 70/30 insulin initially for a few years Previously A1c was 8% or more consistently before starting Invokamet. He was switched from Lantus to [place] in 1/17  Recent history:   INSULIN regimen is described as:  Tresiba 42 units at 9 pm Novolog 8 units before breakfast, 10/12 ac supper  Oral hypoglycemic drugs the patient is taking are: Xigduo, 10/998, 1 tablet twice a day  His A1c has been usually in the upper normal range and about the same at 6.3     Current management, blood sugar patterns and problems identified:  His Evaristo Bury was increased by 4 units on his last visit because of higher fasting readings averaging 145  Although he still has readings as high as 156 in the mornings they are overall better and he has not adjusted [place] further  Blood sugars after meals usually not checked, evening readings are usually at bedtime  Also has regular monitoring at lunch and dinner  Most of his readings at lunch and dinner are fairly close to normal with highest nonfasting reading only 147  Recently no hypoglycemia, this may occur rarely and no low sugar documented recently  He is trying to walk daily, usually more active in the spring and summer  His weight reportedly is about the same  He usually is checking his blood sugar 4 times a day      Has not used the freestyle libre because of cost as well as sometimes the sensor falling off He was told to check into the Dexcom but has not done      Side effects from medications have been: None  Compliance with the medical regimen: Good  Glucose monitoring:  done 3-4 times a day         Glucometer: One Touch Verio.      Blood Glucose readings  from download:   PRE-MEAL Fasting Lunch Dinner Bedtime Overall  Glucose range:  107-156  86-133  84-130  91-147   Mean/median:     ?  Previous readings:  PRE-MEAL Fasting Lunch Dinner Bedtime Overall  Glucose range:  96-166  93-151  86-214  100-172   Mean/median:  145  118  131  124 128     Glycemic control:   Lab Results  Component Value Date   HGBA1C 6.3 08/18/2019   HGBA1C 6.3 05/13/2019   HGBA1C 6.2 01/14/2019   Lab Results  Component Value Date   MICROALBUR <0.7 08/18/2019   LDLCALC 47 03/11/2019   CREATININE 0.95 08/18/2019    Self-care:  Meals: 3 meals per day. Breakfast is eggs or cereal usually,  controlling portions      Dietician visit: Most recent: years ago.                 Weight history:  Wt Readings  from Last 3 Encounters:  05/15/19 211 lb 12.8 oz (96.1 kg)  03/21/19 210 lb (95.3 kg)  03/11/19 206 lb (93.4 kg)    Lab on 08/18/2019  Component Date Value Ref Range Status  . Microalb, Ur 08/18/2019 <0.7  0.0 - 1.9 mg/dL Final  . Creatinine,U 41/32/4401 68.5  mg/dL Final  . Microalb Creat Ratio 08/18/2019 1.0  0.0 - 30.0 mg/g Final  . Sodium 08/18/2019 140  135 - 145 mEq/L Final  . Potassium 08/18/2019 4.6  3.5 - 5.1 mEq/L Final  . Chloride 08/18/2019 104  96 - 112 mEq/L Final  . CO2 08/18/2019 27  19 - 32 mEq/L Final  . Glucose, Bld 08/18/2019 115* 70 - 99 mg/dL Final  . BUN 02/72/5366 17  6 - 23 mg/dL Final  . Creatinine, Ser 08/18/2019 0.95  0.40 - 1.50 mg/dL Final  . Total Bilirubin 08/18/2019 0.4  0.2 - 1.2 mg/dL Final  . Alkaline Phosphatase 08/18/2019 72  39 - 117 U/L Final  . AST 08/18/2019 24  0 - 37 U/L Final  . ALT 08/18/2019 42  0 - 53 U/L Final  . Total Protein 08/18/2019 7.3  6.0 - 8.3 g/dL Final  . Albumin 44/08/4740 4.5  3.5 - 5.2 g/dL Final  . GFR 59/56/3875 80.71  >60.00 mL/min Final  . Calcium 08/18/2019 9.7  8.4 - 10.5 mg/dL Final  . Hgb I4P MFr Bld 08/18/2019 6.3  4.6 - 6.5 % Final   Glycemic Control Guidelines for People with Diabetes:Non Diabetic:  <6%Goal of Therapy: <7%Additional Action Suggested:  >8%     Allergies as of 08/21/2019   No Known Allergies     Medication List       Accurate as of August 21, 2019  8:57 AM. If you have any questions, ask your nurse or doctor.        acetaminophen 650 MG CR tablet Commonly known as: TYLENOL Take 650 mg by mouth every 8 (eight) hours as needed for pain.   azelastine 0.1 % nasal spray Commonly known as: ASTELIN PLACE 2 SPRAYS INTO BOTH NOSTRILS EVERY NIGHT AT BEDTIME AS NEEDED FOR RHINITIS   BD Veo Insulin Syringe U/F 31G X 15/64" 0.5 ML Misc Generic drug: Insulin Syringe-Needle U-100 USE 1 SYRINGE AS DIRECTED 3 TO 4 TIMES DAILY   cyclobenzaprine 5 MG tablet Commonly known as: FLEXERIL Take 1  tablet (5 mg total) by mouth at bedtime.   Eliquis 5 MG Tabs tablet Generic drug: apixaban TAKE 1 TABLET(5 MG) BY MOUTH TWICE DAILY   fish oil-omega-3 fatty acids 1000 MG capsule Take 2 g by mouth 2 (two) times daily.   fluticasone 50 MCG/ACT nasal spray Commonly known as: FLONASE  Place 2 sprays into both nostrils daily as needed for allergies or rhinitis.   glucagon 1 MG injection Inject 1 mg into the vein once as needed.   Insulin Pen Needle 31G X 8 MM Misc Commonly known as: B-D ULTRAFINE III SHORT PEN Use one per day   Insulin Pen Needle 32G X 4 MM Misc Commonly known as: BD Pen Needle Nano U/F Use to give insulin injects 4 times daily.   loratadine 10 MG tablet Commonly known as: CLARITIN Take 10 mg by mouth daily.   NovoLOG FlexPen 100 UNIT/ML FlexPen Generic drug: insulin aspart Inject 10-12 units under the skin three times daily before meals.   OneTouch Verio test strip Generic drug: glucose blood USE 1 STRIP TO CHECK GLUCOSE 4 TIMES DAILY   pregabalin 100 MG capsule Commonly known as: LYRICA Take 1 capsule by mouth twice daily   simvastatin 20 MG tablet Commonly known as: ZOCOR Take 0.5 tablets (10 mg total) by mouth at bedtime.   Systane Ultra PF 0.4-0.3 % Soln Generic drug: Polyethyl Glyc-Propyl Glyc PF Apply 2 drops to eye 3 (three) times daily as needed (for dry eyes).   tamsulosin 0.4 MG Caps capsule Commonly known as: FLOMAX Take 1 capsule (0.4 mg total) by mouth daily.   Evaristo Bury FlexTouch 100 UNIT/ML Sopn FlexTouch Pen Generic drug: insulin degludec INJECT 40 UNITS SUBCUTANEOUSLY AT BEDTIME   Xigduo XR 10-998 MG Tb24 Generic drug: Dapagliflozin-metFORMIN HCl ER Take 1 tablet by mouth twice daily       Allergies:  No Known Allergies  Past Medical History:  Diagnosis Date  . Allergic rhinitis   . Benign prostatic hypertrophy    s/p TUNA procedure aprx 2006,  . Carpal tunnel syndrome 06/17/2009  . Diabetes mellitus    Type II dx  1999  . 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 ED (erectile dysfunction)    After TUNA  . Elevated LFTs 2010   (-) chronic hep  panel  . History of cardiovascular stress test 8/11   Negative  . Hyperlipidemia   . HYPERSOMNIA UNSPECIFIED 12/11/2007  . Hypertension   . Obesity   . PAF (paroxysmal atrial fibrillation) (HCC) 12/21/2009   a. Failed Multaq. b. Prev on flecainide. c. s/p afib ablation 2015.    Past Surgical History:  Procedure Laterality Date  . ABLATION  03-24-14   PVI by Dr Johney Frame  . ATRIAL FIBRILLATION ABLATION N/A 03/24/2014   Procedure: ATRIAL FIBRILLATION ABLATION;  Surgeon: Gardiner Rhyme, MD;  Location: [facility] CATH LAB;  Service: Cardiovascular;  Laterality: N/A;  . CYSTOSCOPY  11-09   neg  @ urology, was rx Flomax  . PROSTATE SURGERY     TUNA  . TEE WITHOUT CARDIOVERSION N/A 03/23/2014   Procedure: TRANSESOPHAGEAL ECHOCARDIOGRAM (TEE);  Surgeon: Pricilla Riffle, MD;  Location: [facility] ENDOSCOPY;  Service: Cardiovascular;  Laterality: N/A;  . VASECTOMY     several years ago    Family History  Problem Relation Age of Onset  . Heart disease Mother   . Diabetes Mother   . Hypertension Mother   . Congestive Heart Failure Mother   . Heart attack Mother   . Hypertension Father   . Colon cancer Neg Hx   . Prostate cancer Neg Hx     Social History:  reports that he quit smoking about 34 years ago. His smoking use included cigarettes. He has never used smokeless tobacco. He reports that he does not drink alcohol or  use drugs.    Review of Systems       Lipids: He has been on low dose simvastatin for a few years, Followed by PCP Triglycerides still relatively high despite taking some fish oil OTC  Labs as below:      Lab Results  Component Value Date   CHOL 108 03/11/2019   HDL 34.40 (L) 03/11/2019   LDLCALC 47 03/11/2019   LDLDIRECT 68.0 03/06/2018   TRIG 136.0 03/11/2019   CHOLHDL 3 03/11/2019               No history of  hypertension, he is taking Farxiga His microalbumin has been normal Not monitoring at home  BP Readings from Last 3 Encounters:  05/15/19 136/68  03/21/19 120/72  03/11/19 134/69      Neuropathy: Has longstanding history of Numbness, pins-and-needle sensation and stinging in feet and lower legs He has been followed by neurologist, taking 100 twice daily of Lyrica Although on his last visit he was told to increase Lyrica to 3 times a day or 300 mg total he is not doing so On some days he will have more pain but not consistently Has not discussed increasing the dose with his neurologist No increased numbness No sensory loss on last exam   LABS:  Lab on 08/18/2019  Component Date Value Ref Range Status  . Microalb, Ur 08/18/2019 <0.7  0.0 - 1.9 mg/dL Final  . Creatinine,U 30/86/5784 68.5  mg/dL Final  . Microalb Creat Ratio 08/18/2019 1.0  0.0 - 30.0 mg/g Final  . Sodium 08/18/2019 140  135 - 145 mEq/L Final  . Potassium 08/18/2019 4.6  3.5 - 5.1 mEq/L Final  . Chloride 08/18/2019 104  96 - 112 mEq/L Final  . CO2 08/18/2019 27  19 - 32 mEq/L Final  . Glucose, Bld 08/18/2019 115* 70 - 99 mg/dL Final  . BUN 69/62/9528 17  6 - 23 mg/dL Final  . Creatinine, Ser 08/18/2019 0.95  0.40 - 1.50 mg/dL Final  . Total Bilirubin 08/18/2019 0.4  0.2 - 1.2 mg/dL Final  . Alkaline Phosphatase 08/18/2019 72  39 - 117 U/L Final  . AST 08/18/2019 24  0 - 37 U/L Final  . ALT 08/18/2019 42  0 - 53 U/L Final  . Total Protein 08/18/2019 7.3  6.0 - 8.3 g/dL Final  . Albumin 41/32/4401 4.5  3.5 - 5.2 g/dL Final  . GFR 02/72/5366 80.71  >60.00 mL/min Final  . Calcium 08/18/2019 9.7  8.4 - 10.5 mg/dL Final  . Hgb Y4I MFr Bld 08/18/2019 6.3  4.6 - 6.5 % Final   Glycemic Control Guidelines for People with Diabetes:Non Diabetic:  <6%Goal of Therapy: <7%Additional Action Suggested:  >8%     Physical Examination:  There were no vitals taken for this visit.  ASSESSMENT/PLAN:     Diabetes type 2   with BMI 29 on insulin  See history of present illness for detailed discussion of current diabetes management, blood sugar patterns and problems identified  His A1c is fairly good with A1c 6.3 and very stable  He is on basal bolus insulin with Davonna Belling  He has been able to maintain good control even though he is not as active in the winter months Blood sugars are being monitored 4 times a day Fasting readings are better with increasing TRESIBA dose on the last visit Also blood sugars later in the day are excellent although he is not checking 2 to 3 hours after meals but mostly  right before meals and bedtime He has been interested in CGM which will also help him alert for higher or lower readings out of range Will prescribe Dexcom which should be easier for him to use than the freestyle libre which he could not keep on his arm consistently He will let us know if he has any difficulty starting this  NEUROPATHY: He has on and off pain in his feet and lower legs and occasionally paresthesia Symptoms are not consistently controlled  He will ask neurologist to increase his Lyrica to up to 300 mg a day  Alternatively may be a candidate for adding Cymbalta  We will check lipids on the next visit  Follow-up in 3 months  There are no Patient Instructions on file for this visit.      Elayne Snare 08/21/2019, 8:57 AM   Note: This office note was prepared with Dragon voice recognition system technology. Any transcriptional errors that result from this process are unintentional. .

## 2019-08-22 ENCOUNTER — Other Ambulatory Visit: Payer: Self-pay

## 2019-08-22 MED ORDER — DEXCOM G6 TRANSMITTER MISC: 1.0000 | 3 refills | Status: DC

## 2019-08-22 MED ORDER — DEXCOM G6 SENSOR MISC: 1.0000 | 3 refills | Status: DC

## 2019-08-25 MED ORDER — PREGABALIN 100 MG PO CAPS: 100.0000 mg | ORAL_CAPSULE | Freq: Three times a day (TID) | ORAL | 5 refills | Status: DC

## 2019-09-04 LAB — PSA: PSA: 0.28

## 2019-09-19 ENCOUNTER — Other Ambulatory Visit: Payer: Self-pay | Admitting: Endocrinology

## 2019-10-22 ENCOUNTER — Other Ambulatory Visit: Payer: Self-pay | Admitting: Endocrinology

## 2019-11-03 ENCOUNTER — Encounter: Payer: Self-pay | Admitting: Internal Medicine

## 2019-11-16 ENCOUNTER — Other Ambulatory Visit: Payer: Self-pay | Admitting: Endocrinology

## 2019-11-17 ENCOUNTER — Encounter: Payer: Self-pay | Admitting: Internal Medicine

## 2019-11-17 ENCOUNTER — Telehealth (INDEPENDENT_AMBULATORY_CARE_PROVIDER_SITE_OTHER): Payer: BC Managed Care – PPO | Admitting: Internal Medicine

## 2019-11-17 VITALS — BP 135/76 | HR 66 | Ht 71.0 in | Wt 222.0 lb

## 2019-11-17 DIAGNOSIS — I480 Paroxysmal atrial fibrillation: Secondary | ICD-10-CM | POA: Diagnosis not present

## 2019-11-17 DIAGNOSIS — I119 Hypertensive heart disease without heart failure: Secondary | ICD-10-CM

## 2019-11-17 DIAGNOSIS — D6869 Other thrombophilia: Secondary | ICD-10-CM | POA: Diagnosis not present

## 2019-11-17 NOTE — Progress Notes (Signed)
Electrophysiology TeleHealth Note   Due to national recommendations of social distancing due to COVID 19, an audio/video telehealth visit is felt to be most appropriate for this patient at this time.  See MyChart message from today for the patient's consent to telehealth for [facility].  Date:  11/17/2019   ID:  Danny [name], DOB [birth year], MRN [id number]  Location: patient's home  Provider location:  [place]   Evaluation Performed: Follow-up visit  PCP:  Wanda Plump, MD   Electrophysiologist:  Dr Johney Frame  Chief Complaint:  palpitations  History of Present Illness:    Danny [name] is a 59 y.o. male who presents via telehealth conferencing today.  Since last being seen in our clinic, the patient reports doing very well.  His afib has been very well for several years post ablation. He denies associated palpitations. He reports having vertiginous spinning about 2 weeks ago while on a lawn mower at work, lasting 30 seconds.  He denies associated arm/leg weakness, confusion, slurred speech or stroke like symptoms.  He has not had any other episodes.   Today, he denies symptoms of palpitations, chest pain, shortness of breath,  lower extremity edema, or syncope.  The patient is otherwise without complaint today.   Past Medical History:  Diagnosis Date  . Allergic rhinitis   . Benign prostatic hypertrophy    s/p TUNA procedure aprx 2006,  . Carpal tunnel syndrome 06/17/2009  . Diabetes mellitus    Type II dx 1999  . 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 ED (erectile dysfunction)    After TUNA  . Elevated LFTs 2010   (-) chronic hep  panel  . History of cardiovascular stress test 8/11   Negative  . Hyperlipidemia   . HYPERSOMNIA UNSPECIFIED 12/11/2007  . Hypertension   . Obesity   . PAF (paroxysmal atrial fibrillation) (HCC) 12/21/2009   a. Failed Multaq. b. Prev on flecainide. c. s/p afib ablation 2015.    Past Surgical History:    Procedure Laterality Date  . ABLATION  03-24-14   PVI by Dr Johney Frame  . ATRIAL FIBRILLATION ABLATION N/A 03/24/2014   Procedure: ATRIAL FIBRILLATION ABLATION;  Surgeon: Gardiner Rhyme, MD;  Location: [facility] CATH LAB;  Service: Cardiovascular;  Laterality: N/A;  . CYSTOSCOPY  11-09   neg  @ urology, was rx Flomax  . PROSTATE SURGERY     TUNA  . TEE WITHOUT CARDIOVERSION N/A 03/23/2014   Procedure: TRANSESOPHAGEAL ECHOCARDIOGRAM (TEE);  Surgeon: Pricilla Riffle, MD;  Location: [facility] ENDOSCOPY;  Service: Cardiovascular;  Laterality: N/A;  . VASECTOMY     several years ago    Current Outpatient Medications  Medication Sig Dispense Refill  . acetaminophen (TYLENOL) 650 MG CR tablet Take 650 mg by mouth every 8 (eight) hours as needed for pain.    Marland Kitchen azelastine (ASTELIN) 0.1 % nasal spray PLACE 2 SPRAYS INTO BOTH NOSTRILS EVERY NIGHT AT BEDTIME AS NEEDED FOR RHINITIS 30 mL 0  . BD VEO INSULIN SYRINGE U/F 31G X 15/64" 0.5 ML MISC USE 1 SYRINGE AS DIRECTED 3 TO 4 TIMES DAILY 100 each 0  . Continuous Blood Gluc Sensor (DEXCOM G6 SENSOR) MISC 1 each by Does not apply route See admin instructions. Use one sensor every 10 days to monitor blood sugar. 9 each 3  . Continuous Blood Gluc Transmit (DEXCOM G6 TRANSMITTER) MISC 1 each by Does not apply route See admin instructions. Use one Transmitter once  every 90 days to transmit blood sugars. 1 each 3  . cyclobenzaprine (FLEXERIL) 5 MG tablet Take 1 tablet (5 mg total) by mouth at bedtime. 30 tablet 3  . ELIQUIS 5 MG TABS tablet TAKE 1 TABLET(5 MG) BY MOUTH TWICE DAILY 60 tablet 9  . fish oil-omega-3 fatty acids 1000 MG capsule Take 2 g by mouth 2 (two) times daily.      . fluticasone (FLONASE) 50 MCG/ACT nasal spray Place 2 sprays into both nostrils daily as needed for allergies or rhinitis. 16 g 5  . glucagon (GLUCAGON EMERGENCY) 1 MG injection Inject 1 mg into the vein once as needed. 1 each 5  . Insulin Pen Needle (B-D ULTRAFINE III SHORT PEN) 31G X 8 MM MISC Use  one per day 30 each 3  . Insulin Pen Needle (BD PEN NEEDLE NANO U/F) 32G X 4 MM MISC Use to give insulin injects 4 times daily. 150 each 3  . loratadine (CLARITIN) 10 MG tablet Take 10 mg by mouth daily.     Marland Kitchen NOVOLOG FLEXPEN 100 UNIT/ML FlexPen Inject 10-12 units under the skin three times daily before meals. 11 pen 3  . ONETOUCH VERIO test strip USE 1 STRIP TO CHECK GLUCOSE 4 TIMES DAILY 150 each 4  . Polyethyl Glyc-Propyl Glyc PF (SYSTANE ULTRA PF) 0.4-0.3 % SOLN Apply 2 drops to eye 3 (three) times daily as needed (for dry eyes).    . pregabalin (LYRICA) 100 MG capsule Take 1 capsule (100 mg total) by mouth 3 (three) times daily. 90 capsule 5  . simvastatin (ZOCOR) 20 MG tablet Take 0.5 tablets (10 mg total) by mouth at bedtime. 45 tablet 2  . tamsulosin (FLOMAX) 0.4 MG CAPS capsule Take 1 capsule (0.4 mg total) by mouth daily. 90 capsule 3  . TRESIBA FLEXTOUCH 100 UNIT/ML FlexTouch Pen INJECT 40 UNITS SUBCUTANEOUSLY AT BEDTIME 15 mL 0  . XIGDUO XR 10-998 MG TB24 Take 1 tablet by mouth twice daily 60 tablet 0   No current facility-administered medications for this visit.    Allergies:   Patient has no known allergies.   Social History:  The patient  reports that he quit smoking about 34 years ago. His smoking use included cigarettes. He has never used smokeless tobacco. He reports that he does not drink alcohol or use drugs.   ROS:  Please see the history of present illness.   All other systems are personally reviewed and negative.    Exam:    Vital Signs:  BP 135/76   Pulse 66   Ht 5\' 11"  (1.803 m)   Wt 222 lb (100.7 kg)   SpO2 97%   BMI 30.96 kg/m   Well sounding and appearing, alert and conversant, regular work of breathing,  good skin color Eyes- anicteric, neuro- grossly intact, skin- no apparent rash or lesions or cyanosis, mouth- oral mucosa is pink  Labs/Other Tests and Data Reviewed:    Recent Labs: 03/11/2019: Hemoglobin 16.3; Platelets 252.0; TSH 2.47 08/18/2019:  ALT 42; BUN 17; Creatinine, Ser 0.95; Potassium 4.6; Sodium 140   Wt Readings from Last 3 Encounters:  11/17/19 222 lb (100.7 kg)  05/15/19 211 lb 12.8 oz (96.1 kg)  03/21/19 210 lb (95.3 kg)     ASSESSMENT & PLAN:    1.  paroxysmal atrial fibrillation He as done very well post ablation off AAD therapy chads2vasc score is 2.  He is on eliquis  2. Hypertensive cardiovascular disease Stable No change required today  3. vertiginous spinning Likely brief vertigo No subsequent events We discussed that he should adequately hydrate and avoid heat exposure this summer. If he has further episodes, he would consider 30 day monitor vs ILR. I have advised that he follow-up with Dr Larose Kells given his history of diabetes.  4. HL Continue statin   Risks, benefits and potential toxicities for medications prescribed and/or refilled reviewed with patient today.   Follow-up:  12 months with AF clinic   Patient Risk:  after full review of this patients clinical status, I feel that they are at moderate risk at this time.  Today, I have spent 15 minutes with the patient with telehealth technology discussing arrhythmia management .    Army Fossa, MD  11/17/2019 9:57 AM     [address] [place] [id number] [phone] (office) [phone] (fax)

## 2019-11-21 ENCOUNTER — Other Ambulatory Visit: Payer: Self-pay | Admitting: Neurology

## 2019-11-21 MED ORDER — PREGABALIN 75 MG PO CAPS: 75.0000 mg | ORAL_CAPSULE | Freq: Three times a day (TID) | ORAL | 5 refills | Status: DC

## 2019-11-22 ENCOUNTER — Other Ambulatory Visit: Payer: Self-pay | Admitting: Endocrinology

## 2019-11-26 ENCOUNTER — Other Ambulatory Visit: Payer: Self-pay

## 2019-11-26 MED ORDER — TRESIBA FLEXTOUCH 100 UNIT/ML ~~LOC~~ SOPN: 42.0000 [IU] | PEN_INJECTOR | Freq: Every day | SUBCUTANEOUS | 2 refills | Status: DC

## 2019-12-04 ENCOUNTER — Other Ambulatory Visit: Payer: Self-pay | Admitting: *Deleted

## 2019-12-04 MED ORDER — APIXABAN 5 MG PO TABS: ORAL_TABLET | ORAL | 5 refills | Status: DC

## 2019-12-04 NOTE — Telephone Encounter (Signed)
Eliquis 5mg  refill request received. Patient is 59 years old, weight-100.7kg, Crea-0.95 on 08/18/2019, Diagnosis-Afib, and last seen by Dr. 08/20/2019 on 11/17/2019 via Video Visit. Dose is appropriate based on dosing criteria. Will send in refill to requested pharmacy.

## 2019-12-20 ENCOUNTER — Other Ambulatory Visit: Payer: Self-pay | Admitting: Endocrinology

## 2019-12-22 ENCOUNTER — Other Ambulatory Visit: Payer: Self-pay

## 2019-12-22 ENCOUNTER — Other Ambulatory Visit (INDEPENDENT_AMBULATORY_CARE_PROVIDER_SITE_OTHER): Payer: BC Managed Care – PPO

## 2019-12-22 DIAGNOSIS — E1142 Type 2 diabetes mellitus with diabetic polyneuropathy: Secondary | ICD-10-CM

## 2019-12-22 LAB — COMPREHENSIVE METABOLIC PANEL
ALT: 47 U/L (ref 0–53)
AST: 28 U/L (ref 0–37)
Albumin: 4.1 g/dL (ref 3.5–5.2)
Alkaline Phosphatase: 70 U/L (ref 39–117)
BUN: 13 mg/dL (ref 6–23)
CO2: 28 mEq/L (ref 19–32)
Calcium: 9.1 mg/dL (ref 8.4–10.5)
Chloride: 105 mEq/L (ref 96–112)
Creatinine, Ser: 0.87 mg/dL (ref 0.40–1.50)
GFR: 89.23 mL/min (ref >60.00)
Glucose, Bld: 137 mg/dL — ABNORMAL HIGH (ref 70–99)
Potassium: 4 mEq/L (ref 3.5–5.1)
Sodium: 141 mEq/L (ref 135–145)
Total Bilirubin: 0.3 mg/dL (ref 0.2–1.2)
Total Protein: 6.7 g/dL (ref 6.0–8.3)

## 2019-12-22 LAB — HEMOGLOBIN A1C: Hgb A1c MFr Bld: 6.3 % (ref 4.6–6.5)

## 2019-12-24 ENCOUNTER — Other Ambulatory Visit: Payer: Self-pay | Admitting: Endocrinology

## 2019-12-25 ENCOUNTER — Encounter: Payer: Self-pay | Admitting: Endocrinology

## 2019-12-25 ENCOUNTER — Ambulatory Visit (INDEPENDENT_AMBULATORY_CARE_PROVIDER_SITE_OTHER): Payer: BC Managed Care – PPO | Admitting: Endocrinology

## 2019-12-25 ENCOUNTER — Other Ambulatory Visit: Payer: Self-pay

## 2019-12-25 ENCOUNTER — Telehealth: Payer: Self-pay

## 2019-12-25 VITALS — BP 144/80 | HR 63 | Ht 71.0 in | Wt 221.0 lb

## 2019-12-25 DIAGNOSIS — E119 Type 2 diabetes mellitus without complications: Secondary | ICD-10-CM

## 2019-12-25 DIAGNOSIS — E7800 Pure hypercholesterolemia, unspecified: Secondary | ICD-10-CM | POA: Diagnosis not present

## 2019-12-25 DIAGNOSIS — Z794 Long term (current) use of insulin: Secondary | ICD-10-CM | POA: Diagnosis not present

## 2019-12-25 DIAGNOSIS — E1142 Type 2 diabetes mellitus with diabetic polyneuropathy: Secondary | ICD-10-CM | POA: Diagnosis not present

## 2019-12-25 LAB — POCT GLYCOSYLATED HEMOGLOBIN (HGB A1C): Hemoglobin A1C: 0 % — AB (ref 4.0–5.6)

## 2019-12-25 LAB — GLUCOSE, POCT (MANUAL RESULT ENTRY): POC Glucose: 141 mg/dl — AB (ref 70–99)

## 2019-12-25 NOTE — Progress Notes (Signed)
Patient ID: Danny [name], male   DOB: 1960-11-25, 59 y.o.   MRN: [id number]    Reason for Appointment: Followup for Type 2 Diabetes  Referring physician: Drue Novel  History of Present Illness:          Diagnosis: Type 2 diabetes mellitus, date of diagnosis: 1992        Past history:  He has had long-standing diabetes and previously was treated with metformin, Amaryl, Januvia and Actos He was taken off metformin because of fear of liver problems.  May have been taken taken off Actos because of swelling of his legs but was on 45 mg at that time Over the last 2 years at least his A1c has been consistently over 8% He was started on insulin in 2011 probably when his A1c was over 9% Initially was given Lantus and Humalog and subsequently switched to 70/30 twice a day for unknown reasons Has not taken Byetta or Victoza in the past He had been on 70/30 insulin initially for a few years Previously A1c was 8% or more consistently before starting Invokamet. He was switched from Lantus to [place] in 1/17  Recent history:   INSULIN regimen is described as:  Tresiba 42 units at 9 pm Novolog 8 units before breakfast, 10/12 ac supper  Oral hypoglycemic drugs the patient is taking are: Xigduo, 10/998, 1 tablet twice a day  His A1c has been usually in the upper normal range and is the same at 6.3    Current management, blood sugar patterns and problems identified:  He forgot his meter  He said that Dexcom is too expensive upfront  Not clear how often he checks his blood sugars but usually has been checking 4 times a day  As usual is blood sugars are generally on an average higher in the morning compared to the rest of the day  He is also continuing to be very active during the workweek and he thinks he can log up to 7 miles walking daily  Even without mealtime coverage at lunch on weekdays his blood sugars in the afternoons at dinnertime are close to 100  We will also  occasionally have mild hypoglycemic symptoms with blood sugars in the upper 60s when he is more active during the day  His Evaristo Bury was no change on the last visit  He has had some weight gain since last year and he thinks this is from increased food intake  Blood sugars after meals at night are reportedly in the low 100s      Has not used the freestyle libre because of cost as well as sometimes the sensor falling off   Side effects from medications have been: None  Compliance with the medical regimen: Good  Glucose monitoring:  done 3-4 times a day         Glucometer: One Touch Verio.      Blood Glucose readings  from recall as above    PRE-MEAL Fasting Lunch Dinner Bedtime Overall  Glucose range:  107-156  86-133  84-130  91-147   Mean/median:     ?    Previous readings:  PRE-MEAL Fasting Lunch Dinner Bedtime Overall  Glucose range:  96-166  93-151  86-214  100-172   Mean/median:  145  118  131  124 128     Glycemic control:   Lab Results  Component Value Date   HGBA1C 141.0 (A) 12/25/2019   HGBA1C 6.3 12/22/2019   HGBA1C 6.3 08/18/2019  Lab Results  Component Value Date   MICROALBUR <0.7 08/18/2019   LDLCALC 47 03/11/2019   CREATININE 0.87 12/22/2019    Self-care:  Meals: 3 meals per day. Breakfast is eggs or cereal usually,  controlling portions      Dietician visit: Most recent: years ago.                 Weight history:  Wt Readings from Last 3 Encounters:  12/25/19 221 lb (100.2 kg)  11/17/19 222 lb (100.7 kg)  05/15/19 211 lb 12.8 oz (96.1 kg)    Office Visit on 12/25/2019  Component Date Value Ref Range Status  . Hemoglobin A1C 12/25/2019 141.0* 4.0 - 5.6 % Final  Lab on 12/22/2019  Component Date Value Ref Range Status  . Sodium 12/22/2019 141  135 - 145 mEq/L Final  . Potassium 12/22/2019 4.0  3.5 - 5.1 mEq/L Final  . Chloride 12/22/2019 105  96 - 112 mEq/L Final  . CO2 12/22/2019 28  19 - 32 mEq/L Final  . Glucose, Bld 12/22/2019 137*  70 - 99 mg/dL Final  . BUN 82/70/7867 13  6 - 23 mg/dL Final  . Creatinine, Ser 12/22/2019 0.87  0.40 - 1.50 mg/dL Final  . Total Bilirubin 12/22/2019 0.3  0.2 - 1.2 mg/dL Final  . Alkaline Phosphatase 12/22/2019 70  39 - 117 U/L Final  . AST 12/22/2019 28  0 - 37 U/L Final  . ALT 12/22/2019 47  0 - 53 U/L Final  . Total Protein 12/22/2019 6.7  6.0 - 8.3 g/dL Final  . Albumin 54/49/2010 4.1  3.5 - 5.2 g/dL Final  . GFR 01/04/1974 89.23  >60.00 mL/min Final  . Calcium 12/22/2019 9.1  8.4 - 10.5 mg/dL Final  . Hgb O8T MFr Bld 12/22/2019 6.3  4.6 - 6.5 % Final   Glycemic Control Guidelines for People with Diabetes:Non Diabetic:  <6%Goal of Therapy: <7%Additional Action Suggested:  >8%     Allergies as of 12/25/2019   No Known Allergies     Medication List       Accurate as of December 25, 2019  8:45 AM. If you have any questions, ask your nurse or doctor.        STOP taking these medications   Dexcom G6 Sensor Misc Stopped by: Reather Littler, MD   Dexcom G6 Transmitter Misc Stopped by: Reather Littler, MD     TAKE these medications   acetaminophen 650 MG CR tablet Commonly known as: TYLENOL Take 650 mg by mouth every 8 (eight) hours as needed for pain.   apixaban 5 MG Tabs tablet Commonly known as: Eliquis TAKE 1 TABLET(5 MG) BY MOUTH TWICE DAILY   azelastine 0.1 % nasal spray Commonly known as: ASTELIN PLACE 2 SPRAYS INTO BOTH NOSTRILS EVERY NIGHT AT BEDTIME AS NEEDED FOR RHINITIS   BD Veo Insulin Syringe U/F 31G X 15/64" 0.5 ML Misc Generic drug: Insulin Syringe-Needle U-100 USE 1 SYRINGE AS DIRECTED 3 TO 4 TIMES DAILY   cyclobenzaprine 5 MG tablet Commonly known as: FLEXERIL Take 1 tablet (5 mg total) by mouth at bedtime.   fish oil-omega-3 fatty acids 1000 MG capsule Take 2 g by mouth 2 (two) times daily.   fluticasone 50 MCG/ACT nasal spray Commonly known as: FLONASE Place 2 sprays into both nostrils daily as needed for allergies or rhinitis.   glucagon 1 MG  injection Inject 1 mg into the vein once as needed.   Insulin Pen Needle 31G X 8 MM  Misc Commonly known as: B-D ULTRAFINE III SHORT PEN Use one per day   Insulin Pen Needle 32G X 4 MM Misc Commonly known as: BD Pen Needle Nano U/F Use to give insulin injects 4 times daily.   loratadine 10 MG tablet Commonly known as: CLARITIN Take 10 mg by mouth daily.   NovoLOG FlexPen 100 UNIT/ML FlexPen Generic drug: insulin aspart INJECT 10 TO 12 UNITS SUBCUTANEOUSLY THREE TIMES DAILY BEFORE MEAL(S)   OneTouch Verio test strip Generic drug: glucose blood USE 1 STRIP TO CHECK GLUCOSE 4 TIMES DAILY   pregabalin 75 MG capsule Commonly known as: Lyrica Take 1 capsule (75 mg total) by mouth 3 (three) times daily.   simvastatin 20 MG tablet Commonly known as: ZOCOR Take 0.5 tablets (10 mg total) by mouth at bedtime.   Systane Ultra PF 0.4-0.3 % Soln Generic drug: Polyethyl Glyc-Propyl Glyc PF Apply 2 drops to eye 3 (three) times daily as needed (for dry eyes).   tamsulosin 0.4 MG Caps capsule Commonly known as: FLOMAX Take 1 capsule (0.4 mg total) by mouth daily.   Evaristo Buryresiba FlexTouch 100 UNIT/ML FlexTouch Pen Generic drug: insulin degludec Inject 0.42 mLs (42 Units total) into the skin daily.   Xigduo XR 10-998 MG Tb24 Generic drug: Dapagliflozin-metFORMIN HCl ER Take 1 tablet by mouth twice daily       Allergies:  No Known Allergies  Past Medical History:  Diagnosis Date  . Allergic rhinitis   . Benign prostatic hypertrophy    s/p TUNA procedure aprx 2006,  . Carpal tunnel syndrome 06/17/2009  . Diabetes mellitus    Type II dx 1999  . 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 ED (erectile dysfunction)    After TUNA  . Elevated LFTs 2010   (-) chronic hep  panel  . History of cardiovascular stress test 8/11   Negative  . Hyperlipidemia   . HYPERSOMNIA UNSPECIFIED 12/11/2007  . Hypertension   . Obesity   . PAF (paroxysmal atrial fibrillation)  (HCC) 12/21/2009   a. Failed Multaq. b. Prev on flecainide. c. s/p afib ablation 2015.    Past Surgical History:  Procedure Laterality Date  . ABLATION  03-24-14   PVI by Dr Johney FrameAllred  . ATRIAL FIBRILLATION ABLATION N/A 03/24/2014   Procedure: ATRIAL FIBRILLATION ABLATION;  Surgeon: Gardiner RhymeJames D Allred, MD;  Location: [facility]ATH [facility];  Service: Cardiovascular;  Laterality: N/A;  . CYSTOSCOPY  11-09   neg  @ urology, was rx Flomax  . PROSTATE SURGERY     TUNA  . TEE WITHOUT CARDIOVERSION N/A 03/23/2014   Procedure: TRANSESOPHAGEAL ECHOCARDIOGRAM (TEE);  Surgeon: Pricilla RifflePaula Ross V, MD;  [facility];  Service: Cardiovascular;  Laterality: N/A;  . VASECTOMY     several years ago    Family History  Problem Relation Age of Onset  . Heart disease Mother   . Diabetes Mother   . Hypertension Mother   . Congestive Heart Failure Mother   . Heart attack Mother   . Hypertension Father   . Colon cancer Neg Hx   . Prostate cancer Neg Hx     Social History:  reports that he quit smoking about 34 years ago. His smoking use included cigarettes. He has never used smokeless tobacco. He reports that he does not drink alcohol and does not use drugs.    Review of Systems       Lipids: He has been on low dose simvastatin for a few years, Followed  by PCP Triglycerides still relatively high despite taking some fish oil OTC  Labs as below:      Lab Results  Component Value Date   CHOL 108 03/11/2019   HDL 34.40 (L) 03/11/2019   LDLCALC 47 03/11/2019   LDLDIRECT 68.0 03/06/2018   TRIG 136.0 03/11/2019   CHOLHDL 3 03/11/2019               No history of hypertension, he is taking [place] Not monitoring at home Blood pressure high normal today, this is not typical  BP Readings from Last 3 Encounters:  12/25/19 (!) 144/80  11/17/19 135/76  05/15/19 136/68      Neuropathy: Has longstanding history of Numbness, pins-and-needle sensation and stinging in feet and lower legs He has been followed by  neurologist, taking 75 mg, 3 daily of Lyrica  He feels the dose is working better and cannot tolerate 300 mg a day as previously recommended because of drowsiness  No increased numbness No sensory loss on last exam   LABS:  Office Visit on 12/25/2019  Component Date Value Ref Range Status  . Hemoglobin A1C 12/25/2019 141.0* 4.0 - 5.6 % Final  Lab on 12/22/2019  Component Date Value Ref Range Status  . Sodium 12/22/2019 141  135 - 145 mEq/L Final  . Potassium 12/22/2019 4.0  3.5 - 5.1 mEq/L Final  . Chloride 12/22/2019 105  96 - 112 mEq/L Final  . CO2 12/22/2019 28  19 - 32 mEq/L Final  . Glucose, Bld 12/22/2019 137* 70 - 99 mg/dL Final  . BUN 81/82/9937 13  6 - 23 mg/dL Final  . Creatinine, Ser 12/22/2019 0.87  0.40 - 1.50 mg/dL Final  . Total Bilirubin 12/22/2019 0.3  0.2 - 1.2 mg/dL Final  . Alkaline Phosphatase 12/22/2019 70  39 - 117 U/L Final  . AST 12/22/2019 28  0 - 37 U/L Final  . ALT 12/22/2019 47  0 - 53 U/L Final  . Total Protein 12/22/2019 6.7  6.0 - 8.3 g/dL Final  . Albumin 16/96/7893 4.1  3.5 - 5.2 g/dL Final  . GFR 81/06/7508 89.23  >60.00 mL/min Final  . Calcium 12/22/2019 9.1  8.4 - 10.5 mg/dL Final  . Hgb C5E MFr Bld 12/22/2019 6.3  4.6 - 6.5 % Final   Glycemic Control Guidelines for People with Diabetes:Non Diabetic:  <6%Goal of Therapy: <7%Additional Action Suggested:  >8%     Physical Examination:  BP (!) 144/80 (BP Location: Left Arm, Patient Position: Sitting, Cuff Size: Normal)   Pulse 63   Ht 5\' 11"  (1.803 m)   Wt 221 lb (100.2 kg)   SpO2 97%   BMI 30.82 kg/m   ASSESSMENT/PLAN:     Diabetes type 2 on insulin  See history of present illness for detailed discussion of current diabetes management, blood sugar patterns and problems identified  His A1c is fairly good with A1c 6.3 and very stable  He is on basal bolus insulin with Xigduo as before  Although he has gained weight his level of control is the same Reportedly has fairly good  blood sugars as discussed above with minimal hypoglycemia Blood sugars are being monitored 4 times a day As before fasting readings may be mildly increased and was 137 in the lab  He will look into the freestyle libre again especially after summer when he is going to have less sweating and problems with adherence Given him manufacture guide on how to keep the sensor on with various techniques  NEUROPATHY: Symptoms are better with Lyrica 75 mg 3 times daily  Blood pressure: High normal today but will continue to follow as it has not been high Last microalbumin normal  Follow-up in 3 months  There are no Patient Instructions on file for this visit.      Reather Littler 12/25/2019, 8:45 AM   Note: This office note was prepared with Dragon voice recognition system technology. Any transcriptional errors that result from this process are unintentional. .

## 2019-12-25 NOTE — Telephone Encounter (Signed)
FAXED DOCUMENTS  Company: [employer] (Faxed on 12/25/19) Document: request for retinopathy report Other records requested: none at this time.  All above requested information has been faxed successfully to [employer] listed above. Documents and fax confirmation have been placed in the faxed file for future reference.

## 2020-01-05 ENCOUNTER — Other Ambulatory Visit: Payer: Self-pay

## 2020-01-05 ENCOUNTER — Encounter: Payer: Self-pay | Admitting: Internal Medicine

## 2020-01-05 ENCOUNTER — Ambulatory Visit: Payer: BC Managed Care – PPO | Admitting: Internal Medicine

## 2020-01-05 VITALS — BP 136/88 | HR 94 | Temp 97.9°F | Resp 18 | Ht 71.0 in | Wt 217.2 lb

## 2020-01-05 DIAGNOSIS — H538 Other visual disturbances: Secondary | ICD-10-CM | POA: Diagnosis not present

## 2020-01-05 DIAGNOSIS — M6208 Separation of muscle (nontraumatic), other site: Secondary | ICD-10-CM | POA: Diagnosis not present

## 2020-01-05 DIAGNOSIS — R42 Dizziness and giddiness: Secondary | ICD-10-CM

## 2020-01-05 DIAGNOSIS — G629 Polyneuropathy, unspecified: Secondary | ICD-10-CM

## 2020-01-05 NOTE — Patient Instructions (Addendum)
°  GO TO THE LAB : Get the blood work     Schedule an MRI of the brain and a carotid ultrasound Plan We will refer you to general surgery

## 2020-01-05 NOTE — Progress Notes (Signed)
Pre visit review using our clinic review tool, if applicable. No additional management support is needed unless otherwise documented below in the visit note. 

## 2020-01-05 NOTE — Progress Notes (Signed)
Subjective:    Patient ID: Danny [name], male    DOB: Mar 25, 1961, 59 y.o.   MRN: [id number]  DOS:  01/05/2020 Type of visit - description: Here to discuss multiple issues  Poor vision: Is going on for a while, described as bilateral blurred vision, sxs are on and off, probably last 1 or 2 hours. Admits to floaters and sometimes visual field seems  wavy. Denies amaurosis fugax, fever chills, weight loss or headaches  Also dizziness on and off for 6 to 8 months: Symptoms are triggered by moving around or bending but are also spontaneous. Last from seconds to hours No associated nausea, diplopia, slurred speech or motor deficits. From time to time he does have numbness at the chin (midline face).  Also has a "hernia" for years, getting worse.  Causing some discomfort.    Review of Systems Denies chest pain, difficulty breathing or palpitation.  Past Medical History:  Diagnosis Date  . Allergic rhinitis   . Benign prostatic hypertrophy    s/p TUNA procedure aprx 2006,  . Carpal tunnel syndrome 06/17/2009  . Diabetes mellitus    Type II dx 1999  . 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 ED (erectile dysfunction)    After TUNA  . Elevated LFTs 2010   (-) chronic hep  panel  . History of cardiovascular stress test 8/11   Negative  . Hyperlipidemia   . HYPERSOMNIA UNSPECIFIED 12/11/2007  . Hypertension   . Obesity   . PAF (paroxysmal atrial fibrillation) (HCC) 12/21/2009   a. Failed Multaq. b. Prev on flecainide. c. s/p afib ablation 2015.    Past Surgical History:  Procedure Laterality Date  . ABLATION  03-24-14   PVI by Dr Johney Frame  . ATRIAL FIBRILLATION ABLATION N/A 03/24/2014   Procedure: ATRIAL FIBRILLATION ABLATION;  Surgeon: Gardiner Rhyme, MD;  Location: [facility] CATH [facility];  Service: Cardiovascular;  Laterality: N/A;  . CYSTOSCOPY  11-09   neg  @ urology, was rx Flomax  . PROSTATE SURGERY     TUNA  . TEE WITHOUT CARDIOVERSION N/A 03/23/2014    Procedure: TRANSESOPHAGEAL ECHOCARDIOGRAM (TEE);  Surgeon: Pricilla Riffle, MD;  [facility] ENDOSCOPY;  Service: Cardiovascular;  Laterality: N/A;  . VASECTOMY     several years ago    Allergies as of 01/05/2020   No Known Allergies     Medication List       Accurate as of January 05, 2020 11:59 PM. If you have any questions, ask your nurse or doctor.        acetaminophen 650 MG CR tablet Commonly known as: TYLENOL Take 650 mg by mouth every 8 (eight) hours as needed for pain.   apixaban 5 MG Tabs tablet Commonly known as: Eliquis TAKE 1 TABLET(5 MG) BY MOUTH TWICE DAILY   azelastine 0.1 % nasal spray Commonly known as: ASTELIN PLACE 2 SPRAYS INTO BOTH NOSTRILS EVERY NIGHT AT BEDTIME AS NEEDED FOR RHINITIS   BD Veo Insulin Syringe U/F 31G X 15/64" 0.5 ML Misc Generic drug: Insulin Syringe-Needle U-100 USE 1 SYRINGE AS DIRECTED 3 TO 4 TIMES DAILY   cyclobenzaprine 5 MG tablet Commonly known as: FLEXERIL Take 1 tablet (5 mg total) by mouth at bedtime.   fish oil-omega-3 fatty acids 1000 MG capsule Take 2 g by mouth 2 (two) times daily.   fluticasone 50 MCG/ACT nasal spray Commonly known as: FLONASE Place 2 sprays into both nostrils daily as needed for allergies or rhinitis.  glucagon 1 MG injection Inject 1 mg into the vein once as needed.   Insulin Pen Needle 31G X 8 MM Misc Commonly known as: B-D ULTRAFINE III SHORT PEN Use one per day   Insulin Pen Needle 32G X 4 MM Misc Commonly known as: BD Pen Needle Nano U/F Use to give insulin injects 4 times daily.   loratadine 10 MG tablet Commonly known as: CLARITIN Take 10 mg by mouth daily.   NovoLOG FlexPen 100 UNIT/ML FlexPen Generic drug: insulin aspart INJECT 10 TO 12 UNITS SUBCUTANEOUSLY THREE TIMES DAILY BEFORE MEAL(S)   OneTouch Verio test strip Generic drug: glucose blood USE 1 STRIP TO CHECK GLUCOSE 4 TIMES DAILY   pregabalin 75 MG capsule Commonly known as: Lyrica Take 1 capsule (75 mg total) by  mouth 3 (three) times daily.   simvastatin 20 MG tablet Commonly known as: ZOCOR Take 0.5 tablets (10 mg total) by mouth at bedtime.   Systane Ultra PF 0.4-0.3 % Soln Generic drug: Polyethyl Glyc-Propyl Glyc PF Apply 2 drops to eye 3 (three) times daily as needed (for dry eyes).   tamsulosin 0.4 MG Caps capsule Commonly known as: FLOMAX Take 1 capsule (0.4 mg total) by mouth daily.   Evaristo Bury FlexTouch 100 UNIT/ML FlexTouch Pen Generic drug: insulin degludec Inject 0.42 mLs (42 Units total) into the skin daily.   Xigduo XR 10-998 MG Tb24 Generic drug: Dapagliflozin-metFORMIN HCl ER Take 1 tablet by mouth twice daily          Objective:   Physical Exam Abdominal:      BP 136/88 (BP Location: Left Arm, Patient Position: Sitting, Cuff Size: Normal)   Pulse 94   Temp 97.9 F (36.6 C) (Oral)   Resp 18   Ht 5\' 11"  (1.803 m)   Wt 217 lb 4 oz (98.5 kg)   SpO2 96%   BMI 30.30 kg/m  General:   Well developed, NAD, BMI noted.  HEENT:  Normocephalic . Face symmetric, atraumatic. EOMI, pupils equal and reactive. Neck: Bilateral carotid pulses palpable, no bruit Lungs:  CTA B Normal respiratory effort, no intercostal retractions, no accessory muscle use. Heart: RRR,  no murmur.  Abdomen:  Not distended, soft, has a midline protuberance consistent with diastasis recti Skin: Not pale. Not jaundice Lower extremities: no pretibial edema bilaterally  Neurologic:  alert & oriented X3.  Speech normal, gait appropriate for age and unassisted. Motor and DTR symmetric. Romberg test: Present, patient got dizzy standing up with closed eyes. Psych--  Cognition and judgment appear intact.  Cooperative with normal attention span and concentration.  Behavior appropriate. No anxious or depressed appearing.     Assessment     Assessment DM , Dr Neuropathy due to DM: NCV 2016: + Peripheral neuropathy HTN Hyperlipidemia Atrial fibrillation, 2011, s/p ablation, now sinus  rhythm, anticoagulated BPH s/p TUNA ED S/P shingles, postherpetic neuralgia R foot TIA (?) 10-2016, saw neuro, Rx carotid 09-12-1991: L ICA 1-39% otherwise (-)  PLAN Visual disturbances: No acute symptoms, no amaurosis fugax, will refer to Dr. [place] office.   Dizziness: As described above, has both peripheral and central features (occasional spontaneous symptoms, Romberg present).  Has multiple cardiovascular risk factors, is anticoagulated, question of a TIA in 2018. Plan: CBC, brain MRI, carotid ultrasound. Diastasis recti: Occasional discomfort, it is a large defect, refer to general surgery for evaluation Neuropathy Saw neurology 02-2019 with neuropathy type of symptoms, MRI was done, no spinal stenosis, they recommended ABIs but I do not believe the  patient pursue them.  Currently symptoms are controlled w/ Lyrica. RTC 9-20 21 CPX   This visit occurred during the SARS-CoV-2 public health emergency.  Safety protocols were in place, including screening questions prior to the visit, additional usage of staff PPE, and extensive cleaning of exam room while observing appropriate contact time as indicated for disinfecting solutions.

## 2020-01-06 LAB — CBC WITH DIFFERENTIAL/PLATELET
Basophils Absolute: 0.1 10*3/uL (ref 0.0–0.1)
Basophils Relative: 0.9 % (ref 0.0–3.0)
Eosinophils Absolute: 0.3 10*3/uL (ref 0.0–0.7)
Eosinophils Relative: 3.6 % (ref 0.0–5.0)
HCT: 48.8 % (ref 39.0–52.0)
Hemoglobin: 16.2 g/dL (ref 13.0–17.0)
Lymphocytes Relative: 24.9 % (ref 12.0–46.0)
Lymphs Abs: 2.4 10*3/uL (ref 0.7–4.0)
MCHC: 33.3 g/dL (ref 30.0–36.0)
MCV: 83.9 fl (ref 78.0–100.0)
Monocytes Absolute: 0.8 10*3/uL (ref 0.1–1.0)
Monocytes Relative: 8.2 % (ref 3.0–12.0)
Neutro Abs: 6 10*3/uL (ref 1.4–7.7)
Neutrophils Relative %: 62.4 % (ref 43.0–77.0)
Platelets: 251 10*3/uL (ref 150.0–400.0)
RBC: 5.82 Mil/uL — ABNORMAL HIGH (ref 4.22–5.81)
RDW: 14 % (ref 11.5–15.5)
WBC: 9.6 10*3/uL (ref 4.0–10.5)

## 2020-01-06 NOTE — Assessment & Plan Note (Signed)
Visual disturbances: No acute symptoms, no amaurosis fugax, will refer to Dr. Hazle Quant office.   Dizziness: As described above, has both peripheral and central features (occasional spontaneous symptoms, Romberg present).  Has multiple cardiovascular risk factors, is anticoagulated, question of a TIA in 2018. Plan: CBC, brain MRI, carotid ultrasound. Diastasis recti: Occasional discomfort, it is a large defect, refer to general surgery for evaluation Neuropathy Saw neurology 02-2019 with neuropathy type of symptoms, MRI was done, no spinal stenosis, they recommended ABIs but I do not believe the patient pursue them.  Currently symptoms are controlled w/ Lyrica. RTC 9-20 21 CPX

## 2020-01-07 ENCOUNTER — Telehealth: Payer: Self-pay | Admitting: Internal Medicine

## 2020-01-07 NOTE — Telephone Encounter (Signed)
Recv'd records from [facility] forwarded 4 pages to Dr. [name] 7/14/21fbg

## 2020-01-10 ENCOUNTER — Other Ambulatory Visit: Payer: Self-pay

## 2020-01-10 ENCOUNTER — Ambulatory Visit (HOSPITAL_BASED_OUTPATIENT_CLINIC_OR_DEPARTMENT_OTHER)
Admission: RE | Admit: 2020-01-10 | Discharge: 2020-01-10 | Disposition: A | Discharge status: Home / Self Care | Payer: BC Managed Care – PPO | Source: Ambulatory Visit | Attending: Internal Medicine | Admitting: Internal Medicine

## 2020-01-10 DIAGNOSIS — H538 Other visual disturbances: Secondary | ICD-10-CM | POA: Diagnosis present

## 2020-01-10 DIAGNOSIS — R42 Dizziness and giddiness: Secondary | ICD-10-CM | POA: Diagnosis not present

## 2020-01-17 ENCOUNTER — Other Ambulatory Visit: Payer: Self-pay | Admitting: Endocrinology

## 2020-02-03 ENCOUNTER — Other Ambulatory Visit: Payer: Self-pay

## 2020-02-03 ENCOUNTER — Ambulatory Visit (HOSPITAL_COMMUNITY)
Admission: RE | Admit: 2020-02-03 | Discharge: 2020-02-03 | Disposition: A | Discharge status: Home / Self Care | Payer: BC Managed Care – PPO | Source: Ambulatory Visit | Attending: Cardiovascular Disease | Admitting: Cardiovascular Disease

## 2020-02-03 DIAGNOSIS — H538 Other visual disturbances: Secondary | ICD-10-CM | POA: Diagnosis not present

## 2020-02-03 DIAGNOSIS — R42 Dizziness and giddiness: Secondary | ICD-10-CM | POA: Diagnosis not present

## 2020-02-06 ENCOUNTER — Telehealth: Payer: Self-pay | Admitting: Endocrinology

## 2020-02-06 NOTE — Telephone Encounter (Signed)
It may be that it is the kidney function that they're talking about. Please find out what his creatinine level is

## 2020-02-06 NOTE — Telephone Encounter (Signed)
Please advise 

## 2020-02-06 NOTE — Telephone Encounter (Signed)
Spoke with patient who stated he was given this information from NP for Dr.Estridge [facility]). Contacted [facility] and spoke with Vernona Rieger. She will research and call back. If labs are available, she will fax.

## 2020-02-06 NOTE — Telephone Encounter (Signed)
Patient called to advise that his kidney doctor has advised him that the Xigduo XR is affecting his bladder function.  Patient would like to know if there is something else he can be prescribed to replace this medication.  Call back number # [phone]

## 2020-02-10 ENCOUNTER — Other Ambulatory Visit: Payer: Self-pay | Admitting: Endocrinology

## 2020-02-13 ENCOUNTER — Telehealth: Payer: Self-pay | Admitting: *Deleted

## 2020-02-13 NOTE — Telephone Encounter (Signed)
Received call from Leotis Shames, CMA at [facility]. Per Dr Lennox Laity, patient has a weak stream and trouble emptying, however patient was not told Davonna Belling was responsible.

## 2020-02-14 NOTE — Telephone Encounter (Signed)
Please relayed the message to the patient and he needs to continue [place]

## 2020-02-16 NOTE — Telephone Encounter (Signed)
Relayed message to patient regarding continuing Xigduo. Patient verbalized understanding and did not have any questions.

## 2020-02-17 NOTE — Telephone Encounter (Signed)
Spoke with patient regarding Xigduo. Patient understands he is to continue taking per Dr. Remus Blake orders.

## 2020-02-20 ENCOUNTER — Other Ambulatory Visit: Payer: Self-pay | Admitting: Endocrinology

## 2020-03-12 ENCOUNTER — Ambulatory Visit (INDEPENDENT_AMBULATORY_CARE_PROVIDER_SITE_OTHER): Payer: BC Managed Care – PPO | Admitting: Internal Medicine

## 2020-03-12 ENCOUNTER — Other Ambulatory Visit: Payer: Self-pay

## 2020-03-12 ENCOUNTER — Encounter: Payer: Self-pay | Admitting: Internal Medicine

## 2020-03-12 VITALS — BP 135/78 | HR 66 | Temp 97.7°F | Resp 16 | Ht 71.0 in | Wt 216.0 lb

## 2020-03-12 DIAGNOSIS — Z0000 Encounter for general adult medical examination without abnormal findings: Secondary | ICD-10-CM | POA: Diagnosis not present

## 2020-03-12 DIAGNOSIS — E785 Hyperlipidemia, unspecified: Secondary | ICD-10-CM | POA: Diagnosis not present

## 2020-03-12 NOTE — Patient Instructions (Addendum)
Check the  blood pressure twice a month. °BP GOAL is between 110/65 and  135/85. °If it is consistently higher or lower, let me know ° ° °GO TO THE LAB : Get the blood work   ° ° °GO TO THE FRONT DESK, PLEASE SCHEDULE YOUR APPOINTMENTS °Come back for a physical exam in 1 year °

## 2020-03-12 NOTE — Progress Notes (Signed)
Pre visit review using our clinic review tool, if applicable. No additional management support is needed unless otherwise documented below in the visit note. 

## 2020-03-12 NOTE — Progress Notes (Signed)
Subjective:    Patient ID: Danny [name], male    DOB: 1960/12/26, 59 y.o.   MRN: [id number]  DOS:  03/12/2020 Type of visit - description: cpx Feels well and has no concerns.  Specifically no LUTS.  No chest pain no difficulty breathing  Review of Systems   A 14 point review of systems is negative    Past Medical History:  Diagnosis Date  . Allergic rhinitis   . Benign prostatic hypertrophy    s/p TUNA procedure aprx 2006,  . Carpal tunnel syndrome 06/17/2009  . Diabetes mellitus    Type II dx 1999  . 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 ED (erectile dysfunction)    After TUNA  . Elevated LFTs 2010   (-) chronic hep  panel  . History of cardiovascular stress test 8/11   Negative  . Hyperlipidemia   . HYPERSOMNIA UNSPECIFIED 12/11/2007  . Hypertension   . Obesity   . PAF (paroxysmal atrial fibrillation) (HCC) 12/21/2009   a. Failed Multaq. b. Prev on flecainide. c. s/p afib ablation 2015.    Past Surgical History:  Procedure Laterality Date  . ABLATION  03-24-14   PVI by Dr Johney Frame  . ATRIAL FIBRILLATION ABLATION N/A 03/24/2014   Procedure: ATRIAL FIBRILLATION ABLATION;  Surgeon: Gardiner Rhyme, MD;  Location: [facility];  Service: Cardiovascular;  Laterality: N/A;  . CYSTOSCOPY  11-09   neg  @ urology, was rx Flomax  . PROSTATE SURGERY     TUNA  . TEE WITHOUT CARDIOVERSION N/A 03/23/2014   Procedure: TRANSESOPHAGEAL ECHOCARDIOGRAM (TEE);  Surgeon: Pricilla Riffle, MD;  Location: [facility] ENDOSCOPY;  Service: Cardiovascular;  Laterality: N/A;  . VASECTOMY     several years ago    Allergies as of 03/12/2020   No Known Allergies     Medication List       Accurate as of March 12, 2020 11:59 PM. If you have any questions, ask your nurse or doctor.        STOP taking these medications   cyclobenzaprine 5 MG tablet Commonly known as: FLEXERIL Stopped by: [name], MD     TAKE these medications   acetaminophen 650 MG CR  tablet Commonly known as: TYLENOL Take 650 mg by mouth every 8 (eight) hours as needed for pain.   apixaban 5 MG Tabs tablet Commonly known as: Eliquis TAKE 1 TABLET(5 MG) BY MOUTH TWICE DAILY   azelastine 0.1 % nasal spray Commonly known as: ASTELIN PLACE 2 SPRAYS INTO BOTH NOSTRILS EVERY NIGHT AT BEDTIME AS NEEDED FOR RHINITIS   BD Veo Insulin Syringe U/F 31G X 15/64" 0.5 ML Misc Generic drug: Insulin Syringe-Needle U-100 USE 1 SYRINGE AS DIRECTED 3 TO 4 TIMES DAILY   fish oil-omega-3 fatty acids 1000 MG capsule Take 2 g by mouth 2 (two) times daily.   fluticasone 50 MCG/ACT nasal spray Commonly known as: FLONASE Place 2 sprays into both nostrils daily as needed for allergies or rhinitis.   glucagon 1 MG injection Inject 1 mg into the vein once as needed.   Insulin Pen Needle 31G X 8 MM Misc Commonly known as: B-D ULTRAFINE III SHORT PEN Use one per day   Insulin Pen Needle 32G X 4 MM Misc Commonly known as: BD Pen Needle Nano U/F Use to give insulin injects 4 times daily.   loratadine 10 MG tablet Commonly known as: CLARITIN Take 10 mg by mouth daily.   NovoLOG FlexPen  100 UNIT/ML FlexPen Generic drug: insulin aspart INJECT 10 TO 12 UNITS SUBCUTANEOUSLY THREE TIMES DAILY BEFORE MEAL(S)   OneTouch Verio test strip Generic drug: glucose blood USE 1 STRIP TO CHECK GLUCOSE 4 TIMES DAILY   pregabalin 75 MG capsule Commonly known as: Lyrica Take 1 capsule (75 mg total) by mouth 3 (three) times daily.   simvastatin 20 MG tablet Commonly known as: ZOCOR Take 0.5 tablets (10 mg total) by mouth at bedtime.   Systane Ultra PF 0.4-0.3 % Soln Generic drug: Polyethyl Glyc-Propyl Glyc PF Apply 2 drops to eye 3 (three) times daily as needed (for dry eyes).   tamsulosin 0.4 MG Caps capsule Commonly known as: FLOMAX Take 1 capsule (0.4 mg total) by mouth daily.   Evaristo Bury FlexTouch 100 UNIT/ML FlexTouch Pen Generic drug: insulin degludec INJECT 40 UNITS SUBCUTANEOUSLY  AT BEDTIME   Xigduo XR 10-998 MG Tb24 Generic drug: Dapagliflozin-metFORMIN HCl ER Take 1 tablet by mouth twice daily          Objective:   Physical Exam BP 135/78 (BP Location: Left Arm, Patient Position: Sitting, Cuff Size: Normal)   Pulse 66   Temp 97.7 F (36.5 C) (Oral)   Resp 16   Ht 5\' 11"  (1.803 m)   Wt 216 lb (98 kg)   SpO2 97%   BMI 30.13 kg/m  General: Well developed, NAD, BMI noted Neck: No  thyromegaly  HEENT:  Normocephalic . Face symmetric, atraumatic Lungs:  CTA B Normal respiratory effort, no intercostal retractions, no accessory muscle use. Heart: RRR,  no murmur.  Abdomen:  Not distended, soft, non-tender. No rebound or rigidity.  Reducible umbilical hernia noted, also diastases recti Lower extremities: no pretibial edema bilaterally DRE: Normal, not tender prostate.  No stools found Skin: Exposed areas without rash. Not pale. Not jaundice Neurologic:  alert & oriented X3.  Speech normal, gait appropriate for age and unassisted Strength symmetric and appropriate for age.  Psych: Cognition and judgment appear intact.  Cooperative with normal attention span and concentration.  Behavior appropriate. No anxious or depressed appearing.     Assessment    Assessment DM , Dr Neuropathy due to DM: NCV 2016: + Peripheral neuropathy HTN Hyperlipidemia Atrial fibrillation, 2011, s/p ablation, now sinus rhythm, anticoagulated BPH s/p TUNA ED S/P shingles, postherpetic neuralgia R foot TIA (?) 10-2016, saw neuro, Rx carotid 09-12-1991: L ICA 1-39% otherwise (-).  Carotid US negative (01-2020)  PLAN Here for CPX Chronic medical problems: All seem well controlled at this point, see other doctors including Endo, urology,thus recommend to RTC in 1 year unless he has problems.  Dizziness: See last visit, symptoms resolved, work-up negative RTC 1 year  This visit occurred during the SARS-CoV-2 public health emergency.  Safety protocols were in place,  including screening questions prior to the visit, additional usage of staff PPE, and extensive cleaning of exam room while observing appropriate contact time as indicated for disinfecting solutions.

## 2020-03-13 LAB — LIPID PANEL
Cholesterol: 103 mg/dL (ref <200)
HDL: 32 mg/dL — ABNORMAL LOW (ref >=40)
LDL Cholesterol (Calc): 42 mg/dL (calc)
Non-HDL Cholesterol (Calc): 71 mg/dL (calc) (ref <130)
Total CHOL/HDL Ratio: 3.2 (calc) (ref <5.0)
Triglycerides: 235 mg/dL — ABNORMAL HIGH (ref <150)

## 2020-03-13 LAB — PSA: PSA: 0.45 ng/mL (ref <=4.0)

## 2020-03-14 ENCOUNTER — Encounter: Payer: Self-pay | Admitting: Internal Medicine

## 2020-03-14 NOTE — Assessment & Plan Note (Signed)
-  Tdap 2012 - pnm shot 2015;  prevnar 07-2015 - s/p Shingrix x2 - s/p C-19 shot (JJ) - flu shot will get at work -CCS:  Cscope Dr Chales Abrahams in [place] : 2009 hyperplastic polyps.  Colonoscopy 11-2012  Colonoscopy 08/07/2017, no polyps, no colitis, Next 5 years (Dr. Fonnie Mu) -Prostate cancer screening, DRE normal today, check a PSA.  No symptoms  -Labs:FLP, PSA -Diet-exercise - discussed

## 2020-03-14 NOTE — Assessment & Plan Note (Signed)
Here for CPX Chronic medical problems: All seem well controlled at this point, see other doctors including Endo, urology,thus recommend to RTC in 1 year unless he has problems.  Dizziness: See last visit, symptoms resolved, work-up negative RTC 1 year

## 2020-03-17 ENCOUNTER — Telehealth: Payer: Self-pay | Admitting: Internal Medicine

## 2020-03-17 MED ORDER — FLUTICASONE PROPIONATE 50 MCG/ACT NA SUSP: 2.0000 | Freq: Every day | NASAL | 5 refills | Status: DC | PRN

## 2020-03-17 NOTE — Telephone Encounter (Signed)
Rx sent 

## 2020-03-17 NOTE — Telephone Encounter (Signed)
Medication: fluticasone (FLONASE) 50 MCG/ACT nasal spray  Has the patient contacted their pharmacy? No. (If no, request that the patient contact the pharmacy for the refill.) (If yes, when and what did the pharmacy advise?)  Preferred Pharmacy (with phone number or street name):  [id number] Rosalita Levan, [place] - [address] AT [facility] [address]  [address] [place], [place] [address]  Phone:  [phone] Fax:  [phone]  DEA #:  [id number]  Agent: Please be advised that RX refills may take up to 3 business days. We ask that you follow-up with your pharmacy.

## 2020-03-20 ENCOUNTER — Other Ambulatory Visit: Payer: Self-pay | Admitting: Endocrinology

## 2020-03-24 ENCOUNTER — Other Ambulatory Visit: Payer: Self-pay | Admitting: Internal Medicine

## 2020-04-02 NOTE — Addendum Note (Signed)
Addended byConrad Anderson D on: 04/02/2020 10:47 AM   Modules accepted: Orders

## 2020-04-05 ENCOUNTER — Telehealth: Payer: Self-pay | Admitting: Internal Medicine

## 2020-04-05 NOTE — Telephone Encounter (Signed)
Called pt explained that per the notes it shows Dr. Antonieta Pert wasn't able to assit and that he should be referred to plastic surgeon. Referral was placed, Patient got a call frm [facility] but was confused. I was able  To explain the reason for referral, patient stated he was going to give them a call back to schedule/

## 2020-04-05 NOTE — Telephone Encounter (Signed)
Caller name: Bonita Quin (wife) Call back number: [phone]   Calling in regards to a referral for the umbilical hernia. Patient states Dr. Derrell Lolling cancelled their appointment. Patient wife would like to know what is the status.

## 2020-04-19 ENCOUNTER — Other Ambulatory Visit: Payer: Self-pay | Admitting: Endocrinology

## 2020-04-26 ENCOUNTER — Other Ambulatory Visit (INDEPENDENT_AMBULATORY_CARE_PROVIDER_SITE_OTHER): Payer: BC Managed Care – PPO

## 2020-04-26 ENCOUNTER — Encounter: Payer: Self-pay | Admitting: Endocrinology

## 2020-04-26 ENCOUNTER — Other Ambulatory Visit: Payer: Self-pay

## 2020-04-26 ENCOUNTER — Other Ambulatory Visit: Payer: Self-pay | Admitting: Endocrinology

## 2020-04-26 DIAGNOSIS — Z794 Long term (current) use of insulin: Secondary | ICD-10-CM

## 2020-04-26 DIAGNOSIS — E7800 Pure hypercholesterolemia, unspecified: Secondary | ICD-10-CM

## 2020-04-26 DIAGNOSIS — E119 Type 2 diabetes mellitus without complications: Secondary | ICD-10-CM | POA: Diagnosis not present

## 2020-04-26 LAB — COMPREHENSIVE METABOLIC PANEL
ALT: 49 U/L (ref 0–53)
AST: 29 U/L (ref 0–37)
Albumin: 4.3 g/dL (ref 3.5–5.2)
Alkaline Phosphatase: 69 U/L (ref 39–117)
BUN: 16 mg/dL (ref 6–23)
CO2: 28 mEq/L (ref 19–32)
Calcium: 8.8 mg/dL (ref 8.4–10.5)
Chloride: 104 mEq/L (ref 96–112)
Creatinine, Ser: 0.91 mg/dL (ref 0.40–1.50)
GFR: 91.11 mL/min (ref >60.00)
Glucose, Bld: 106 mg/dL — ABNORMAL HIGH (ref 70–99)
Potassium: 3.9 mEq/L (ref 3.5–5.1)
Sodium: 140 mEq/L (ref 135–145)
Total Bilirubin: 0.4 mg/dL (ref 0.2–1.2)
Total Protein: 7.2 g/dL (ref 6.0–8.3)

## 2020-04-26 LAB — LIPID PANEL
Cholesterol: 97 mg/dL (ref 0–200)
HDL: 36.1 mg/dL — ABNORMAL LOW (ref >39.00)
LDL Cholesterol: 40 mg/dL (ref 0–99)
NonHDL: 61.16
Total CHOL/HDL Ratio: 3
Triglycerides: 105 mg/dL (ref 0.0–149.0)
VLDL: 21 mg/dL (ref 0.0–40.0)

## 2020-04-26 LAB — HEMOGLOBIN A1C: Hgb A1c MFr Bld: 6.5 % (ref 4.6–6.5)

## 2020-04-29 ENCOUNTER — Ambulatory Visit: Payer: BC Managed Care – PPO | Admitting: Endocrinology

## 2020-04-29 ENCOUNTER — Encounter: Payer: Self-pay | Admitting: Endocrinology

## 2020-04-29 ENCOUNTER — Other Ambulatory Visit: Payer: Self-pay

## 2020-04-29 VITALS — BP 112/70 | HR 67 | Ht 71.0 in | Wt 218.2 lb

## 2020-04-29 DIAGNOSIS — E7800 Pure hypercholesterolemia, unspecified: Secondary | ICD-10-CM

## 2020-04-29 DIAGNOSIS — E119 Type 2 diabetes mellitus without complications: Secondary | ICD-10-CM | POA: Diagnosis not present

## 2020-04-29 DIAGNOSIS — Z794 Long term (current) use of insulin: Secondary | ICD-10-CM

## 2020-04-29 DIAGNOSIS — E1142 Type 2 diabetes mellitus with diabetic polyneuropathy: Secondary | ICD-10-CM | POA: Diagnosis not present

## 2020-04-29 MED ORDER — METFORMIN HCL ER 500 MG PO TB24
2000.0000 mg | ORAL_TABLET | Freq: Every day | ORAL | 3 refills | Status: DC
Start: 2020-04-29 — End: 2020-08-12

## 2020-04-29 MED ORDER — FREESTYLE LIBRE 14 DAY SENSOR MISC: 1.0000 [IU] | 4 refills | Status: DC

## 2020-04-29 NOTE — Progress Notes (Signed)
Patient ID: Danny GreenhouseFred W Danh, male   DOB: Nov 11, 1960, 59 y.o.   MRN: [id number]    Reason for Appointment: Followup for Type 2 Diabetes  Referring physician: Drue NovelPaz  History of Present Illness:          Diagnosis: Type 2 diabetes mellitus, date of diagnosis: 1992        Past history:  He has had long-standing diabetes and previously was treated with metformin, Amaryl, Januvia and Actos He was taken off metformin because of fear of liver problems.  May have been taken taken off Actos because of swelling of his legs but was on 45 mg at that time Over the last 2 years at least his A1c has been consistently over 8% He was started on insulin in 2011 probably when his A1c was over 9% Initially was given Lantus and Humalog and subsequently switched to 70/30 twice a day for unknown reasons Has not taken Byetta or Victoza in the past He had been on 70/30 insulin initially for a few years Previously A1c was 8% or more consistently before starting Invokamet. He was switched from Lantus to [place] in 1/17  Recent history:   INSULIN regimen is described as:  Tresiba 42 units at 9 pm Novolog 8 units before breakfast, 12 ac supper  Oral hypoglycemic drugs the patient is taking are: Xigduo, 10/998, 1 tablet twice a day  His A1c has been usually in the upper normal range and is the same at 6.3    Current management, blood sugar patterns and problems identified:  He is checking his blood sugar multiple times a day but complaining of his fingertips getting sore  Again blood sugars are very consistently averaging about 130 at any given time, lower in the afternoon and slightly higher in the morning overall.  No hypoglycemia  As before he appears not to need any Humalog coverage at lunchtime at least on weekdays because of increased activity  Will have sporadic high readings after meals based on his diet  Usually consistent with taking his NovoLog before starting to eat  Has not  adjusted his Evaristo Buryresiba since blood sugars are not consistently high in the morning      Has not used the freestyle libre because of cost as well as sometimes the sensor falling off in the past   Side effects from medications have been: None  Compliance with the medical regimen: Good  Glucose monitoring:  done 3-4 times a day         Glucometer: One Touch Verio.      Blood Glucose readings  From   PRE-MEAL Fasting Lunch Dinner Bedtime Overall  Glucose range:  116-160   87-285  91-173   Mean/median:  139   132  131  130   POST-MEAL PC Breakfast PC Lunch PC Dinner  Glucose range:   93-142   Mean/median:   115    PREVIOUSLY:  PRE-MEAL Fasting Lunch Dinner Bedtime Overall  Glucose range:  107-156  86-133  84-130  91-147   Mean/median:     ?      Glycemic control:   Lab Results  Component Value Date   HGBA1C 6.5 04/26/2020   HGBA1C 0.0 (A) 12/25/2019   HGBA1C 6.3 12/22/2019   Lab Results  Component Value Date   MICROALBUR <0.7 08/18/2019   LDLCALC 40 04/26/2020   CREATININE 0.91 04/26/2020    Self-care:  Meals: 3 meals per day. Breakfast is eggs or cereal usually,  controlling portions  Dietician visit: Most recent: years ago.                 Weight history:  Wt Readings from Last 3 Encounters:  04/29/20 218 lb 3.2 oz (99 kg)  03/12/20 216 lb (98 kg)  01/05/20 217 lb 4 oz (98.5 kg)    Lab on 04/26/2020  Component Date Value Ref Range Status   Cholesterol 04/26/2020 97  0 - 200 mg/dL Final   ATP III Classification       Desirable:  < 200 mg/dL               Borderline High:  200 - 239 mg/dL          High:  > = 161 mg/dL   Triglycerides 09/60/4540 105.0  0 - 149 mg/dL Final   Normal:  <981 mg/dLBorderline High:  150 - 199 mg/dL   HDL 19/14/7829 56.21* >39.00 mg/dL Final   VLDL 30/86/5784 21.0  0.0 - 40.0 mg/dL Final   LDL Cholesterol 04/26/2020 40  0 - 99 mg/dL Final   Total CHOL/HDL Ratio 04/26/2020 3   Final                  Men          Women1/2  Average Risk     3.4          3.3Average Risk          5.0          4.42X Average Risk          9.6          7.13X Average Risk          15.0          11.0                       NonHDL 04/26/2020 61.16   Final   NOTE:  Non-HDL goal should be 30 mg/dL higher than patient's LDL goal (i.e. LDL goal of < 70 mg/dL, would have non-HDL goal of < 100 mg/dL)   Sodium 69/62/9528 413  135 - 145 mEq/L Final   Potassium 04/26/2020 3.9  3.5 - 5.1 mEq/L Final   Chloride 04/26/2020 104  96 - 112 mEq/L Final   CO2 04/26/2020 28  19 - 32 mEq/L Final   Glucose, Bld 04/26/2020 106* 70 - 99 mg/dL Final   BUN 24/40/1027 16  6 - 23 mg/dL Final   Creatinine, Ser 04/26/2020 0.91  0.40 - 1.50 mg/dL Final   Total Bilirubin 04/26/2020 0.4  0.2 - 1.2 mg/dL Final   Alkaline Phosphatase 04/26/2020 69  39 - 117 U/L Final   AST 04/26/2020 29  0 - 37 U/L Final   ALT 04/26/2020 49  0 - 53 U/L Final   Total Protein 04/26/2020 7.2  6.0 - 8.3 g/dL Final   Albumin 25/36/6440 4.3  3.5 - 5.2 g/dL Final   GFR 34/74/2595 91.11  >60.00 mL/min Final   Calculated using the CKD-EPI Creatinine Equation (2021)   Calcium 04/26/2020 8.8  8.4 - 10.5 mg/dL Final   Hgb G3O MFr Bld 04/26/2020 6.5  4.6 - 6.5 % Final   Glycemic Control Guidelines for People with Diabetes:Non Diabetic:  <6%Goal of Therapy: <7%Additional Action Suggested:  >8%     Allergies as of 04/29/2020   No Known Allergies     Medication List       Accurate as of April 29, 2020  9:02 AM. If you have any questions, ask your nurse or doctor.        acetaminophen 650 MG CR tablet Commonly known as: TYLENOL Take 650 mg by mouth every 8 (eight) hours as needed for pain.   apixaban 5 MG Tabs tablet Commonly known as: Eliquis TAKE 1 TABLET(5 MG) BY MOUTH TWICE DAILY   azelastine 0.1 % nasal spray Commonly known as: ASTELIN PLACE 2 SPRAYS INTO BOTH NOSTRILS EVERY NIGHT AT BEDTIME AS NEEDED FOR RHINITIS   BD Veo Insulin Syringe U/F 31G X 15/64" 0.5  ML Misc Generic drug: Insulin Syringe-Needle U-100 USE 1 SYRINGE AS DIRECTED 3 TO 4 TIMES DAILY   fish oil-omega-3 fatty acids 1000 MG capsule Take 2 g by mouth 2 (two) times daily.   fluticasone 50 MCG/ACT nasal spray Commonly known as: FLONASE Place 2 sprays into both nostrils daily as needed for allergies or rhinitis.   glucagon 1 MG injection Inject 1 mg into the vein once as needed.   Insulin Pen Needle 31G X 8 MM Misc Commonly known as: B-D ULTRAFINE III SHORT PEN Use one per day   Insulin Pen Needle 32G X 4 MM Misc Commonly known as: BD Pen Needle Nano U/F Use to give insulin injects 4 times daily.   loratadine 10 MG tablet Commonly known as: CLARITIN Take 10 mg by mouth daily.   NovoLOG FlexPen 100 UNIT/ML FlexPen Generic drug: insulin aspart INJECT 10 TO 12 UNITS SUBCUTANEOUSLY THREE TIMES DAILY BEFORE MEAL(S)   OneTouch Verio test strip Generic drug: glucose blood USE 1 STRIP TO CHECK GLUCOSE 4 TIMES DAILY   pregabalin 75 MG capsule Commonly known as: Lyrica Take 1 capsule (75 mg total) by mouth 3 (three) times daily.   simvastatin 20 MG tablet Commonly known as: ZOCOR Take 0.5 tablets (10 mg total) by mouth at bedtime.   Systane Ultra PF 0.4-0.3 % Soln Generic drug: Polyethyl Glyc-Propyl Glyc PF Apply 2 drops to eye 3 (three) times daily as needed (for dry eyes).   tamsulosin 0.4 MG Caps capsule Commonly known as: FLOMAX Take 1 capsule (0.4 mg total) by mouth daily.   Evaristo Bury FlexTouch 100 UNIT/ML FlexTouch Pen Generic drug: insulin degludec INJECT 42 UNITS SUBCUTANEOUSLY ONCE DAILY   Xigduo XR 10-998 MG Tb24 Generic drug: Dapagliflozin-metFORMIN HCl ER Take 1 tablet by mouth twice daily       Allergies:  No Known Allergies  Past Medical History:  Diagnosis Date   Allergic rhinitis    Benign prostatic hypertrophy    s/p TUNA procedure aprx 2006,   Carpal tunnel syndrome 06/17/2009   Diabetes mellitus    Type II dx 1999   DM type  2 with diabetic peripheral neuropathy (HCC) 10/15/2014   ED (erectile dysfunction)    After TUNA   Elevated LFTs 2010   (-) chronic hep  panel   History of cardiovascular stress test 8/11   Negative   Hyperlipidemia    HYPERSOMNIA UNSPECIFIED 12/11/2007   Hypertension    Obesity    PAF (paroxysmal atrial fibrillation) (HCC) 12/21/2009   a. Failed Multaq. b. Prev on flecainide. c. s/p afib ablation 2015.    Past Surgical History:  Procedure Laterality Date   ABLATION  03-24-14   PVI by Dr Johney Frame   ATRIAL FIBRILLATION ABLATION N/A 03/24/2014   Procedure: ATRIAL FIBRILLATION ABLATION;  Surgeon: Gardiner Rhyme, MD;  Location: [facility];  Service: Cardiovascular;  Laterality: N/A;   CYSTOSCOPY  11-09  neg  @ urology, was rx Flomax   PROSTATE SURGERY     TUNA   TEE WITHOUT CARDIOVERSION N/A 03/23/2014   Procedure: TRANSESOPHAGEAL ECHOCARDIOGRAM (TEE);  Surgeon: Pricilla Riffle, MD;  Location: [facility] ENDOSCOPY;  Service: Cardiovascular;  Laterality: N/A;   VASECTOMY     several years ago    Family History  Problem Relation Age of Onset   Heart disease Mother    Diabetes Mother    Hypertension Mother    Congestive Heart Failure Mother    Heart attack Mother    Hypertension Father    Colon cancer Neg Hx    Prostate cancer Neg Hx     Social History:  reports that he quit smoking about 34 years ago. His smoking use included cigarettes. He has never used smokeless tobacco. He reports that he does not drink alcohol and does not use drugs.    Review of Systems       Lipids: He has been on low dose simvastatin for a few years, Followed by PCP Triglycerides now back to normal  Labs as below:      Lab Results  Component Value Date   CHOL 97 04/26/2020   HDL 36.10 (L) 04/26/2020   LDLCALC 40 04/26/2020   LDLDIRECT 68.0 03/06/2018   TRIG 105.0 04/26/2020   CHOLHDL 3 04/26/2020               No history of hypertension, he is taking [place] Not monitoring at  home   BP Readings from Last 3 Encounters:  04/29/20 112/70  03/12/20 135/78  01/05/20 136/88      Neuropathy: Has longstanding history of Numbness, pins-and-needle sensation and stinging in feet and lower legs He has been followed by neurologist, taking 75 mg, 3 daily of Lyrica Symptoms are mostly controlled  No increased numbness No sensory loss on last exam  He says he has a burning discomfort in his bladder area, only occasionally this may be better after voiding.  Again his urologist tells him that this is related to [place]  LABS:  Lab on 04/26/2020  Component Date Value Ref Range Status   Cholesterol 04/26/2020 97  0 - 200 mg/dL Final   ATP III Classification       Desirable:  < 200 mg/dL               Borderline High:  200 - 239 mg/dL          High:  > = 166 mg/dL   Triglycerides 12/24/1599 105.0  0 - 149 mg/dL Final   Normal:  <093 mg/dLBorderline High:  150 - 199 mg/dL   HDL 23/55/7322 02.54* >39.00 mg/dL Final   VLDL 27/11/2374 21.0  0.0 - 40.0 mg/dL Final   LDL Cholesterol 04/26/2020 40  0 - 99 mg/dL Final   Total CHOL/HDL Ratio 04/26/2020 3   Final                  Men          Women1/2 Average Risk     3.4          3.3Average Risk          5.0          4.42X Average Risk          9.6          7.13X Average Risk          15.0  11.0                       NonHDL 04/26/2020 61.16   Final   NOTE:  Non-HDL goal should be 30 mg/dL higher than patient's LDL goal (i.e. LDL goal of < 70 mg/dL, would have non-HDL goal of < 100 mg/dL)   Sodium 94/49/6759 163  135 - 145 mEq/L Final   Potassium 04/26/2020 3.9  3.5 - 5.1 mEq/L Final   Chloride 04/26/2020 104  96 - 112 mEq/L Final   CO2 04/26/2020 28  19 - 32 mEq/L Final   Glucose, Bld 04/26/2020 106* 70 - 99 mg/dL Final   BUN 84/66/5993 16  6 - 23 mg/dL Final   Creatinine, Ser 04/26/2020 0.91  0.40 - 1.50 mg/dL Final   Total Bilirubin 04/26/2020 0.4  0.2 - 1.2 mg/dL Final   Alkaline Phosphatase  04/26/2020 69  39 - 117 U/L Final   AST 04/26/2020 29  0 - 37 U/L Final   ALT 04/26/2020 49  0 - 53 U/L Final   Total Protein 04/26/2020 7.2  6.0 - 8.3 g/dL Final   Albumin 57/06/7791 4.3  3.5 - 5.2 g/dL Final   GFR 90/30/0923 91.11  >60.00 mL/min Final   Calculated using the CKD-EPI Creatinine Equation (2021)   Calcium 04/26/2020 8.8  8.4 - 10.5 mg/dL Final   Hgb R0Q MFr Bld 04/26/2020 6.5  4.6 - 6.5 % Final   Glycemic Control Guidelines for People with Diabetes:Non Diabetic:  <6%Goal of Therapy: <7%Additional Action Suggested:  >8%     Physical Examination:  BP 112/70    Pulse 67    Ht 5\' 11"  (1.803 m)    Wt 218 lb 3.2 oz (99 kg)    SpO2 97%    BMI 30.43 kg/m   ASSESSMENT/PLAN:     Diabetes type 2 on insulin  See history of present illness for detailed discussion of current diabetes management, blood sugar patterns and problems identified  His A1c is consistently in the target range and now 6.5  He is on basal bolus insulin with Xigduo as before  As above his blood sugars were checked very frequently and no consistent pattern seen Likely benefiting from on with stable blood sugar control and usually not requiring much insulin adjustment Fasting readings tend to be mildly high but still averaging below 140 from his current dose of [place]  He is willing to try the freestyle libre again and see how much the cost will be, he can at least use this once a month  Lower abdominal discomfort: Unclear of the etiology Very unlikely that this is related to [place] but he can try leaving it off for 2 weeks Given him temporary prescription for Metformin ER to replace the Xigduo and he will let [place] know if it makes his symptoms better  Lipids: Very well controlled and triglycerides are also normal now  No recurrence of high blood pressure reading which was present last time  Follow-up in 3 months  There are no Patient Instructions on file for this visit.      [place] 04/29/2020, 9:02 AM   Note: This office note was prepared with Dragon voice recognition system technology. Any transcriptional errors that result from this process are unintentional. .

## 2020-05-10 ENCOUNTER — Telehealth: Payer: Self-pay | Admitting: Endocrinology

## 2020-05-10 ENCOUNTER — Other Ambulatory Visit: Payer: Self-pay | Admitting: *Deleted

## 2020-05-10 MED ORDER — FREESTYLE CONTROL SOLUTION VI LIQD: 3 refills | Status: DC

## 2020-05-10 MED ORDER — FREESTYLE TEST VI STRP: ORAL_STRIP | 5 refills | Status: DC

## 2020-05-10 NOTE — Telephone Encounter (Signed)
Patient called to request RX's  for controlled solution and test strips for the [facility] 14 day (patient needs to calibrate his meter) be sent to: [facility] [address], [place] - [address] Phone:  [phone]  Fax:  [phone]

## 2020-05-10 NOTE — Telephone Encounter (Signed)
Rx's have been sent. 

## 2020-05-23 ENCOUNTER — Other Ambulatory Visit: Payer: Self-pay | Admitting: Internal Medicine

## 2020-05-24 ENCOUNTER — Telehealth: Payer: Self-pay | Admitting: Internal Medicine

## 2020-05-24 ENCOUNTER — Other Ambulatory Visit: Payer: Self-pay | Admitting: Neurology

## 2020-05-24 NOTE — Telephone Encounter (Signed)
Medication:  pregabalin (LYRICA) 75 MG capsule [[id number]     Has the patient contacted their pharmacy? (If no, request that the patient contact the pharmacy for the refill.) (If yes, when and what did the pharmacy advise?)     Preferred Pharmacy (with phone number or street name):  [facility] [address] [place] - [address]  [address] [place] [address]  Phone:  [phone] Fax:  [phone]    Agent: Please be advised that RX refills may take up to 3 business days. We ask that you follow-up with your pharmacy.

## 2020-05-24 NOTE — Telephone Encounter (Signed)
Last written: 11/21/19 by Dr Karel Jarvis Last ov:03/12/2020 Next ov: none  Contract: none  UDS: none

## 2020-05-24 NOTE — Telephone Encounter (Signed)
Pt last saw Dr Johney Frame 11/17/19 video visit Covid-19, last labs 04/26/20 Creat 0.91, age 59, weight 99kg, based on specified criteria pt is on appropriate dosage of Eliquis 5mg  BID.  Will refill rx.

## 2020-05-25 MED ORDER — PREGABALIN 75 MG PO CAPS
75.0000 mg | ORAL_CAPSULE | Freq: Three times a day (TID) | ORAL | 5 refills | Status: DC
Start: 2020-05-25 — End: 2020-11-23

## 2020-05-25 NOTE — Telephone Encounter (Signed)
Prescription sent

## 2020-06-06 ENCOUNTER — Other Ambulatory Visit: Payer: Self-pay | Admitting: Endocrinology

## 2020-06-24 ENCOUNTER — Ambulatory Visit: Payer: BC Managed Care – PPO | Admitting: Podiatry

## 2020-06-24 ENCOUNTER — Encounter: Payer: Self-pay | Admitting: Podiatry

## 2020-06-24 ENCOUNTER — Other Ambulatory Visit: Payer: Self-pay

## 2020-06-24 DIAGNOSIS — M7741 Metatarsalgia, right foot: Secondary | ICD-10-CM | POA: Diagnosis not present

## 2020-06-24 DIAGNOSIS — E0840 Diabetes mellitus due to underlying condition with diabetic neuropathy, unspecified: Secondary | ICD-10-CM | POA: Diagnosis not present

## 2020-06-24 DIAGNOSIS — M7742 Metatarsalgia, left foot: Secondary | ICD-10-CM

## 2020-06-24 DIAGNOSIS — Z794 Long term (current) use of insulin: Secondary | ICD-10-CM | POA: Diagnosis not present

## 2020-07-09 NOTE — Progress Notes (Signed)
  Subjective:  Patient ID: Danny [name], male    DOB: 04/28/1961,  MRN: [id number]  Chief Complaint  Patient presents with  . Foot Problem    I have some coldness and I have neuropathy on both feet and I am a diabetic    60 y.o. male presents with the above complaint. History confirmed with patient.  Says he has diabetic for 10 years unsure of last a.m. blood sugar but last A1c 6.3.  Denies swelling endorses burning throbbing and tenderness to the feet.  Objective:  Physical Exam: warm, good capillary refill, no trophic changes or ulcerative lesions, normal DP and PT pulses and absent protective sensation. Left Foot: normal exam, no swelling, tenderness, instability; ligaments intact, full range of motion of all ankle/foot joints  Right Foot: normal exam, no swelling, tenderness, instability; ligaments intact, full range of motion of all ankle/foot joints   Assessment:   1. Diabetes mellitus due to underlying condition, controlled, with diabetic neuropathy, with long-term current use of insulin (HCC)   2. Metatarsalgia of both feet      Plan:  Patient was evaluated and treated and all questions answered.  Diabetes with neuropathy -Would benefit from diabetic inserts due to neuropathy -Casted for CMO's. -Discussed possible treatments for neuropathy for symptom management  No follow-ups on file.

## 2020-07-20 LAB — HM DIABETES EYE EXAM

## 2020-07-21 ENCOUNTER — Encounter: Payer: Self-pay | Admitting: *Deleted

## 2020-07-28 ENCOUNTER — Telehealth: Payer: Self-pay | Admitting: Pharmacist

## 2020-07-28 NOTE — Telephone Encounter (Signed)
Eliquis prior authorization submitted. Pt has hx of GI bleed in 2018, so changing to Xarelto which has 50% higher relative bleed risk is not clinically safe.

## 2020-07-29 NOTE — Telephone Encounter (Signed)
Spoke to patient. Made him aware that Elqiuis was approved. There was an issue activating copay card. I gave him the # [phone] to call for info for copay card Pt appreciative of the call

## 2020-08-06 ENCOUNTER — Other Ambulatory Visit: Payer: Self-pay | Admitting: Endocrinology

## 2020-08-06 NOTE — Telephone Encounter (Signed)
Last OV 05/19/20 Please advise

## 2020-08-18 ENCOUNTER — Other Ambulatory Visit: Payer: Self-pay | Admitting: Endocrinology

## 2020-08-19 ENCOUNTER — Encounter: Payer: Self-pay | Admitting: Orthotics

## 2020-08-19 ENCOUNTER — Other Ambulatory Visit: Payer: Self-pay

## 2020-08-19 ENCOUNTER — Ambulatory Visit (INDEPENDENT_AMBULATORY_CARE_PROVIDER_SITE_OTHER): Payer: BC Managed Care – PPO | Admitting: Podiatry

## 2020-08-19 DIAGNOSIS — M7742 Metatarsalgia, left foot: Secondary | ICD-10-CM

## 2020-08-19 DIAGNOSIS — M7741 Metatarsalgia, right foot: Secondary | ICD-10-CM

## 2020-08-19 NOTE — Patient Instructions (Signed)

## 2020-08-19 NOTE — Progress Notes (Signed)
Subjective: patient presents today for orthotic pick up. Patient voices no new complaints.  Objective: Orthotics were fitted to patient's feet. No discomfort and no rubbing. Patient satisfied with the orthotics.  A:Metatarsalgia of both feet   Plan: orthotics were dispensed to patient with instructions for break in wear and to call the office with any concerns or questions.  Misty Stanley Hardie Veltre-cma

## 2020-08-22 ENCOUNTER — Other Ambulatory Visit: Payer: Self-pay | Admitting: *Deleted

## 2020-08-22 MED ORDER — ONETOUCH VERIO VI STRP: ORAL_STRIP | 3 refills | Status: DC

## 2020-08-22 MED ORDER — ONETOUCH VERIO REFLECT W/DEVICE KIT: PACK | 0 refills | Status: DC

## 2020-08-22 MED ORDER — ONETOUCH DELICA PLUS LANCET33G MISC: 2 refills | Status: DC

## 2020-08-25 ENCOUNTER — Other Ambulatory Visit (INDEPENDENT_AMBULATORY_CARE_PROVIDER_SITE_OTHER): Payer: BC Managed Care – PPO

## 2020-08-25 ENCOUNTER — Other Ambulatory Visit: Payer: Self-pay

## 2020-08-25 DIAGNOSIS — Z794 Long term (current) use of insulin: Secondary | ICD-10-CM

## 2020-08-25 DIAGNOSIS — E119 Type 2 diabetes mellitus without complications: Secondary | ICD-10-CM | POA: Diagnosis not present

## 2020-08-25 LAB — BASIC METABOLIC PANEL
BUN: 20 mg/dL (ref 6–23)
CO2: 33 mEq/L — ABNORMAL HIGH (ref 19–32)
Calcium: 9.3 mg/dL (ref 8.4–10.5)
Chloride: 103 mEq/L (ref 96–112)
Creatinine, Ser: 1.12 mg/dL (ref 0.40–1.50)
GFR: 70.85 mL/min (ref >60.00)
Glucose, Bld: 118 mg/dL — ABNORMAL HIGH (ref 70–99)
Potassium: 4.4 mEq/L (ref 3.5–5.1)
Sodium: 139 mEq/L (ref 135–145)

## 2020-08-25 LAB — HEMOGLOBIN A1C: Hgb A1c MFr Bld: 6.4 % (ref 4.6–6.5)

## 2020-08-25 LAB — MICROALBUMIN / CREATININE URINE RATIO
Creatinine,U: 109 mg/dL
Microalb Creat Ratio: 0.6 mg/g (ref 0.0–30.0)
Microalb, Ur: <0.7 mg/dL (ref 0.0–1.9)

## 2020-08-30 ENCOUNTER — Other Ambulatory Visit: Payer: BC Managed Care – PPO

## 2020-09-02 ENCOUNTER — Encounter: Payer: Self-pay | Admitting: Endocrinology

## 2020-09-02 ENCOUNTER — Ambulatory Visit: Payer: BC Managed Care – PPO | Admitting: Endocrinology

## 2020-09-02 ENCOUNTER — Other Ambulatory Visit: Payer: Self-pay

## 2020-09-02 VITALS — BP 148/80 | HR 67 | Ht 71.0 in | Wt 217.0 lb

## 2020-09-02 DIAGNOSIS — E1142 Type 2 diabetes mellitus with diabetic polyneuropathy: Secondary | ICD-10-CM

## 2020-09-02 DIAGNOSIS — E7800 Pure hypercholesterolemia, unspecified: Secondary | ICD-10-CM | POA: Diagnosis not present

## 2020-09-02 DIAGNOSIS — I10 Essential (primary) hypertension: Secondary | ICD-10-CM

## 2020-09-02 NOTE — Patient Instructions (Addendum)
44 Tresiba  Novolog 8-10 in am, not 12  Try Xigduo in pm  Check bp weekly

## 2020-09-02 NOTE — Progress Notes (Signed)
Patient ID: Danny [name], male   DOB: March 14, 1961, 60 y.o.   MRN: [id number]    Reason for Appointment: Followup for Type 2 Diabetes  Referring physician: Larose Kells  History of Present Illness:          Diagnosis: Type 2 diabetes mellitus, date of diagnosis: 1992        Past history:  He has had long-standing diabetes and previously was treated with metformin, Amaryl, Januvia and Actos He was taken off metformin because of fear of liver problems.  May have been taken taken off Actos because of swelling of his legs but was on 45 mg at that time Over the last 2 years at least his A1c has been consistently over 8% He was started on insulin in 2011 probably when his A1c was over 9% Initially was given Lantus and Humalog and subsequently switched to 70/30 twice a day for unknown reasons Has not taken Byetta or Victoza in the past He had been on 70/30 insulin initially for a few years Previously A1c was 8% or more consistently before starting Invokamet. He was switched from Lantus to [place] in 1/17  Recent history:   INSULIN regimen is described as:  Tresiba 42 units at 9 pm Novolog 8-12 units before breakfast, 12 ac supper  Oral hypoglycemic drugs the patient is taking are: Xigduo, 10/998, 1 tablet twice a day  His A1c has been usually in the upper normal range, now 6.4    Current management, blood sugar patterns and problems identified:  He is checking his blood sugar up to 4 times a day and still not able to get the freestyle libre  As before his fasting readings are about the highest of the day  However he has not increased his [place]  About a month ago he may have had higher readings from an injection for trigger finger  He now says that if he is active in the evening at dinnertime or later he will not take any NovoLog coverage for the evening meal  However does not appear to have any high readings after dinner usually  His weight has been maintained about the  same  He is taking his Xigduo twice a day and has no side effects with this now      Has not used the freestyle libre because of cost as well as sometimes the sensor falling off in the past   Side effects from medications have been: None  Compliance with the medical regimen: Good  Glucose monitoring:  done 3-4 times a day         Glucometer: One Touch Verio.      Blood Glucose readings meter download as follows   PRE-MEAL Fasting Lunch Dinner Bedtime Overall  Glucose range:  115-160  97-119  87-160    Mean/median:  133  118  100   121   POST-MEAL PC Breakfast PC Lunch PC Dinner  Glucose range:    103-158  Mean/median:    131    PREVIOUSLY:  PRE-MEAL Fasting Lunch Dinner Bedtime Overall  Glucose range:  116-160   87-285  91-173   Mean/median:  139   132  131  130   POST-MEAL PC Breakfast PC Lunch PC Dinner  Glucose range:   93-142   Mean/median:   115       Glycemic control:   Lab Results  Component Value Date   HGBA1C 6.4 08/25/2020   HGBA1C 6.5 04/26/2020   HGBA1C 0.0 (  A) 12/25/2019   Lab Results  Component Value Date   MICROALBUR <0.7 08/25/2020   LDLCALC 40 04/26/2020   CREATININE 1.12 08/25/2020    Self-care:  Meals: 3 meals per day. Breakfast is eggs or cereal usually,  controlling portions      Dietician visit: Most recent: years ago.                 Weight history:  Wt Readings from Last 3 Encounters:  09/02/20 217 lb (98.4 kg)  04/29/20 218 lb 3.2 oz (99 kg)  03/12/20 216 lb (98 kg)    No visits with results within 1 Week(s) from this visit.  Latest known visit with results is:  Lab on 08/25/2020  Component Date Value Ref Range Status  . Microalb, Ur 08/25/2020 <0.7  0.0 - 1.9 mg/dL Final  . Creatinine,U 08/25/2020 109.0  mg/dL Final  . Microalb Creat Ratio 08/25/2020 0.6  0.0 - 30.0 mg/g Final  . Sodium 08/25/2020 139  135 - 145 mEq/L Final  . Potassium 08/25/2020 4.4  3.5 - 5.1 mEq/L Final  . Chloride 08/25/2020 103  96 - 112 mEq/L  Final  . CO2 08/25/2020 33* 19 - 32 mEq/L Final  . Glucose, Bld 08/25/2020 118* 70 - 99 mg/dL Final  . BUN 08/25/2020 20  6 - 23 mg/dL Final  . Creatinine, Ser 08/25/2020 1.12  0.40 - 1.50 mg/dL Final  . GFR 08/25/2020 70.85  >60.00 mL/min Final   Calculated using the CKD-EPI Creatinine Equation (2021)  . Calcium 08/25/2020 9.3  8.4 - 10.5 mg/dL Final  . Hgb A1c MFr Bld 08/25/2020 6.4  4.6 - 6.5 % Final   Glycemic Control Guidelines for People with Diabetes:Non Diabetic:  <6%Goal of Therapy: <7%Additional Action Suggested:  >8%     Allergies as of 09/02/2020   No Known Allergies     Medication List       Accurate as of September 02, 2020 12:50 PM. If you have any questions, ask your nurse or doctor.        acetaminophen 650 MG CR tablet Commonly known as: TYLENOL Take 650 mg by mouth every 8 (eight) hours as needed for pain.   azelastine 0.1 % nasal spray Commonly known as: ASTELIN PLACE 2 SPRAYS INTO BOTH NOSTRILS EVERY NIGHT AT BEDTIME AS NEEDED FOR RHINITIS   BD Veo Insulin Syringe U/F 31G X 15/64" 0.5 ML Misc Generic drug: Insulin Syringe-Needle U-100 USE 1 SYRINGE AS DIRECTED 3 TO 4 TIMES DAILY   Eliquis 5 MG Tabs tablet Generic drug: apixaban TAKE 1 TABLET(5 MG) BY MOUTH TWICE DAILY   fish oil-omega-3 fatty acids 1000 MG capsule Take 2 g by mouth 2 (two) times daily.   fluticasone 50 MCG/ACT nasal spray Commonly known as: FLONASE Place 2 sprays into both nostrils daily as needed for allergies or rhinitis.   FreeStyle Control Solution Liqd Use to calibrate freestyle [employer] 14 Day Sensor Misc 1 Units by Does not apply route every 14 (fourteen) days.   FREESTYLE TEST STRIPS test strip Generic drug: glucose blood Use to check blood sugar 4 times daily   OneTouch Verio test strip Generic drug: glucose blood Use as instructed to check blood sugar 4 times per day dx code E11.9   glucagon 1 MG injection Inject 1 mg into the vein once as needed.    Insulin Pen Needle 31G X 8 MM Misc Commonly known as: B-D ULTRAFINE III SHORT PEN Use one per day  Insulin Pen Needle 32G X 4 MM Misc Commonly known as: BD Pen Needle Nano U/F Use to give insulin injects 4 times daily.   loratadine 10 MG tablet Commonly known as: CLARITIN Take 10 mg by mouth daily.   NovoLOG FlexPen 100 UNIT/ML FlexPen Generic drug: insulin aspart INJECT 10 TO 12 UNITS SUBCUTANEOUSLY THREE TIMES DAILY BEFORE MEAL(S)   OneTouch Delica Plus [id number] Misc Use to check blood sugar 4 times per day   OneTouch Verio Reflect w/Device Kit 1   Polyethyl Glyc-Propyl Glyc PF 0.4-0.3 % Soln Apply 2 drops to eye 3 (three) times daily as needed (for dry eyes).   pregabalin 75 MG capsule Commonly known as: Lyrica Take 1 capsule (75 mg total) by mouth 3 (three) times daily.   simvastatin 20 MG tablet Commonly known as: ZOCOR Take 0.5 tablets (10 mg total) by mouth at bedtime.   tamsulosin 0.4 MG Caps capsule Commonly known as: FLOMAX Take 1 capsule (0.4 mg total) by mouth daily.   Tyler Aas FlexTouch 100 UNIT/ML FlexTouch Pen Generic drug: insulin degludec INJECT 40 UNITS SUBCUTANEOUSLY AT BEDTIME   Xigduo XR 10-998 MG Tb24 Generic drug: Dapagliflozin-metFORMIN HCl ER Take 1 tablet by mouth twice daily       Allergies:  No Known Allergies  Past Medical History:  Diagnosis Date  . Allergic rhinitis   . Benign prostatic hypertrophy    s/p TUNA procedure aprx 2006,  . Carpal tunnel syndrome 06/17/2009  . Diabetes mellitus    Type II dx 1999  . DM type 2 with diabetic peripheral neuropathy ([place]) 10/15/2014  . ED (erectile dysfunction)    After TUNA  . Elevated LFTs 2010   (-) chronic hep  panel  . History of cardiovascular stress test 8/11   Negative  . Hyperlipidemia   . HYPERSOMNIA UNSPECIFIED 12/11/2007  . Hypertension   . Obesity   . PAF (paroxysmal atrial fibrillation) ([place]) 12/21/2009   a. Failed Multaq. b. Prev on flecainide. c. s/p afib  ablation 2015.    Past Surgical History:  Procedure Laterality Date  . ABLATION  03-24-14   PVI by Dr Rayann Heman  . ATRIAL FIBRILLATION ABLATION N/A 03/24/2014   Procedure: ATRIAL FIBRILLATION ABLATION;  Surgeon: Coralyn Mark, MD;  Location: [facility] CATH [facility];  Service: Cardiovascular;  Laterality: N/A;  . CYSTOSCOPY  11-09   neg  @ urology, was rx Flomax  . PROSTATE SURGERY     TUNA  . TEE WITHOUT CARDIOVERSION N/A 03/23/2014   Procedure: TRANSESOPHAGEAL ECHOCARDIOGRAM (TEE);  Surgeon: Fay Records, MD;  Location: [facility] ENDOSCOPY;  Service: Cardiovascular;  Laterality: N/A;  . VASECTOMY     several years ago    Family History  Problem Relation Age of Onset  . Heart disease Mother   . Diabetes Mother   . Hypertension Mother   . Congestive Heart Failure Mother   . Heart attack Mother   . Hypertension Father   . Colon cancer Neg Hx   . Prostate cancer Neg Hx     Social History:  reports that he quit smoking about 35 years ago. His smoking use included cigarettes. He has never used smokeless tobacco. He reports that he does not drink alcohol and does not use drugs.    Review of Systems       Lipids: He has been on low dose simvastatin long-term, Followed by PCP Has good control except slightly low HDL  Labs as below:      Lab Results  Component  Value Date   CHOL 97 04/26/2020   HDL 36.10 (L) 04/26/2020   LDLCALC 40 04/26/2020   LDLDIRECT 68.0 03/06/2018   TRIG 105.0 04/26/2020   CHOLHDL 3 04/26/2020               No history of hypertension, he is taking [place] Not monitoring at home Blood pressure usually not high and not clear why it is higher today, checked twice  BP Readings from Last 3 Encounters:  09/02/20 (!) 148/80  04/29/20 112/70  03/12/20 135/78      Neuropathy: Has longstanding history of Numbness, pins-and-needle sensation and stinging in feet and lower legs He has been followed by neurologist, taking 75 mg, 3 daily of Lyrica Symptoms are mostly  controlled  Only has some leg cramps at night in the left calf No sensory loss on exam  He was having burning discomfort in the bladder area but he thinks this is better with cutting back on caffeine     LABS:  No visits with results within 1 Week(s) from this visit.  Latest known visit with results is:  Lab on 08/25/2020  Component Date Value Ref Range Status  . Microalb, Ur 08/25/2020 <0.7  0.0 - 1.9 mg/dL Final  . Creatinine,U 08/25/2020 109.0  mg/dL Final  . Microalb Creat Ratio 08/25/2020 0.6  0.0 - 30.0 mg/g Final  . Sodium 08/25/2020 139  135 - 145 mEq/L Final  . Potassium 08/25/2020 4.4  3.5 - 5.1 mEq/L Final  . Chloride 08/25/2020 103  96 - 112 mEq/L Final  . CO2 08/25/2020 33* 19 - 32 mEq/L Final  . Glucose, Bld 08/25/2020 118* 70 - 99 mg/dL Final  . BUN 08/25/2020 20  6 - 23 mg/dL Final  . Creatinine, Ser 08/25/2020 1.12  0.40 - 1.50 mg/dL Final  . GFR 08/25/2020 70.85  >60.00 mL/min Final   Calculated using the CKD-EPI Creatinine Equation (2021)  . Calcium 08/25/2020 9.3  8.4 - 10.5 mg/dL Final  . Hgb A1c MFr Bld 08/25/2020 6.4  4.6 - 6.5 % Final   Glycemic Control Guidelines for People with Diabetes:Non Diabetic:  <6%Goal of Therapy: <7%Additional Action Suggested:  >8%     Physical Examination:  BP (!) 148/80 (Cuff Size: Large)   Pulse 67   Ht '5\' 11"'  (1.803 m)   Wt 217 lb (98.4 kg)   SpO2 97%   BMI 30.27 kg/m   Diabetic Foot Exam - Simple   Simple Foot Form Diabetic Foot exam was performed with the following findings: Yes   Visual Inspection No deformities, no ulcerations, no other skin breakdown bilaterally: Yes See comments: Yes Sensation Testing Intact to touch and monofilament testing bilaterally: Yes Pulse Check Posterior Tibialis and Dorsalis pulse intact bilaterally: Yes Comments Calluses 1st MT.  Reduced left pulses     ASSESSMENT/PLAN:     Diabetes type 2 on insulin  See history of present illness for detailed discussion of  current diabetes management, blood sugar patterns and problems identified  His A1c is consistently in the target range and now 6.4  He is on basal bolus insulin with Xigduo Monitoring blood sugars with fingersticks now  Usually not having postprandial hyperglycemia Fasting readings tend to be mildly increased and are the highest of the day For this reason he will need to be on at least 2 units on his [place] If he is eating any carbohydrates at dinnertime he should take at least a few units of insulin Also he can try to  take both Xigduo tablets at dinnertime to see if this helps his fasting readings  Usually does not need much mealtime coverage especially at lunch if he is fairly active  Again discussed use of freestyle libre and he will try to get this again  Possible hypertension: Blood pressure is higher than usual today He will follow up with his PCP and also try to check at home  Follow-up in 3 months  Patient Instructions  44 Tresiba  Novolog 8-10 in am, not 12  Try Xigduo in pm  Check bp weekly        Elayne Snare 09/02/2020, 12:50 PM   Note: This office note was prepared with Dragon voice recognition system technology. Any transcriptional errors that result from this process are unintentional. .

## 2020-09-09 ENCOUNTER — Encounter: Payer: Self-pay | Admitting: Internal Medicine

## 2020-09-20 ENCOUNTER — Other Ambulatory Visit: Payer: Self-pay | Admitting: Internal Medicine

## 2020-11-14 ENCOUNTER — Other Ambulatory Visit: Payer: Self-pay | Admitting: Internal Medicine

## 2020-11-14 ENCOUNTER — Other Ambulatory Visit: Payer: Self-pay | Admitting: Endocrinology

## 2020-11-15 NOTE — Progress Notes (Signed)
Primary Care Physician: Colon Branch, MD Primary Cardiologist: none Primary Electrophysiologist: Dr Rayann Heman Referring Physician: Dr Rayann Heman   Danny [name] is a 60 y.o. male with a history of DM, HTN, HLD, atrial fibrillation who presents for follow up in the [facility]. Patient is on Eliquis for a CHADS2VASC score of 2. Patient had previously been on Multaq and flecainide and had an afib ablation in 2015.   On follow up today, patient reports that he has done very well since his last visit. He has not really had any afib to speak of since his ablation. He denies any bleeding issues on anticoagulation.   Today, he denies symptoms of palpitations, chest pain, shortness of breath, orthopnea, PND, lower extremity edema, dizziness, presyncope, syncope, snoring, daytime somnolence, bleeding, or neurologic sequela. The patient is tolerating medications without difficulties and is otherwise without complaint today.    Atrial Fibrillation Risk Factors:  he does not have symptoms or diagnosis of sleep apnea. he does not have a history of rheumatic fever.   he has a BMI of Body mass index is 30.8 kg/m.Marland Kitchen Filed Weights   11/16/20 0950  Weight: 100.2 kg    Family History  Problem Relation Age of Onset  . Heart disease Mother   . Diabetes Mother   . Hypertension Mother   . Congestive Heart Failure Mother   . Heart attack Mother   . Hypertension Father   . Colon cancer Neg Hx   . Prostate cancer Neg Hx      Atrial Fibrillation Management history:  Previous antiarrhythmic drugs: Multaq, flecainide Previous cardioversions: none Previous ablations: 2015 CHADS2VASC score: 2 Anticoagulation history: Eliquis   Past Medical History:  Diagnosis Date  . Allergic rhinitis   . Benign prostatic hypertrophy    s/p TUNA procedure aprx 2006,  . Carpal tunnel syndrome 06/17/2009  . Diabetes mellitus    Type II dx 1999  . DM type 2 with diabetic peripheral  neuropathy ([place]) 10/15/2014  . ED (erectile dysfunction)    After TUNA  . Elevated LFTs 2010   (-) chronic hep  panel  . History of cardiovascular stress test 8/11   Negative  . Hyperlipidemia   . HYPERSOMNIA UNSPECIFIED 12/11/2007  . Hypertension   . Obesity   . PAF (paroxysmal atrial fibrillation) ([place]) 12/21/2009   a. Failed Multaq. b. Prev on flecainide. c. s/p afib ablation 2015.   Past Surgical History:  Procedure Laterality Date  . ABLATION  03-24-14   PVI by Dr Rayann Heman  . ATRIAL FIBRILLATION ABLATION N/A 03/24/2014   Procedure: ATRIAL FIBRILLATION ABLATION;  Surgeon: Coralyn Mark, MD;  Location: [facility] CATH [facility];  Service: Cardiovascular;  Laterality: N/A;  . CYSTOSCOPY  11-09   neg  @ urology, was rx Flomax  . PROSTATE SURGERY     TUNA  . TEE WITHOUT CARDIOVERSION N/A 03/23/2014   Procedure: TRANSESOPHAGEAL ECHOCARDIOGRAM (TEE);  Surgeon: Fay Records, MD;  Location: [facility];  Service: Cardiovascular;  Laterality: N/A;  . VASECTOMY     several years ago    Current Outpatient Medications  Medication Sig Dispense Refill  . acetaminophen (TYLENOL) 650 MG CR tablet Take 650 mg by mouth every 8 (eight) hours as needed for pain.    Marland Kitchen azelastine (ASTELIN) 0.1 % nasal spray PLACE 2 SPRAYS INTO BOTH NOSTRILS EVERY NIGHT AT BEDTIME AS NEEDED FOR RHINITIS 30 mL 0  . BD VEO INSULIN SYRINGE U/F 31G X 15/64" 0.5 ML  MISC USE 1 SYRINGE AS DIRECTED 3 TO 4 TIMES DAILY 100 each 0  . Blood Glucose Calibration (FREESTYLE CONTROL SOLUTION) LIQD Use to calibrate freestyle libre 1 each 3  . Blood Glucose Monitoring Suppl (ONETOUCH VERIO REFLECT) w/Device KIT 1 1 kit 0  . Continuous Blood Gluc Sensor (FREESTYLE LIBRE 14 DAY SENSOR) MISC 1 Units by Does not apply route every 14 (fourteen) days. 2 each 4  . ELIQUIS 5 MG TABS tablet TAKE 1 TABLET(5 MG) BY MOUTH TWICE DAILY 60 tablet 6  . fish oil-omega-3 fatty acids 1000 MG capsule Take 2 g by mouth 2 (two) times daily.    . fluticasone (FLONASE) 50  MCG/ACT nasal spray SHAKE LIQUID AND USE 2 SPRAYS IN EACH NOSTRIL DAILY AS NEEDED FOR ALLERGIES OR RHINITIS 16 g 3  . glucagon (GLUCAGON EMERGENCY) 1 MG injection Inject 1 mg into the vein once as needed. 1 each 5  . glucose blood (ONETOUCH VERIO) test strip Use as instructed to check blood sugar 4 times per day dx code E11.9 125 each 3  . Insulin Pen Needle (B-D ULTRAFINE III SHORT PEN) 31G X 8 MM MISC Use one per day 30 each 3  . Insulin Pen Needle (BD PEN NEEDLE NANO U/F) 32G X 4 MM MISC Use to give insulin injects 4 times daily. 150 each 3  . Lancets (ONETOUCH DELICA PLUS IEPPIR51O) MISC Use to check blood sugar 4 times per day 200 each 2  . loratadine (CLARITIN) 10 MG tablet Take 10 mg by mouth daily.    Marland Kitchen NOVOLOG FLEXPEN 100 UNIT/ML FlexPen INJECT 10 TO 12 UNITS SUBCUTANEOUSLY THREE TIMES DAILY BEFORE MEAL(S) 15 mL 3  . Polyethyl Glyc-Propyl Glyc PF 0.4-0.3 % SOLN Apply 2 drops to eye 3 (three) times daily as needed (for dry eyes).    . pregabalin (LYRICA) 75 MG capsule Take 1 capsule (75 mg total) by mouth 3 (three) times daily. 90 capsule 5  . simvastatin (ZOCOR) 20 MG tablet Take 0.5 tablets (10 mg total) by mouth at bedtime. 45 tablet 1  . tamsulosin (FLOMAX) 0.4 MG CAPS capsule Take 1 capsule (0.4 mg total) by mouth daily. 90 capsule 3  . TRESIBA FLEXTOUCH 100 UNIT/ML FlexTouch Pen INJECT 40 UNITS SUBCUTANEOUSLY AT BEDTIME 15 mL 3  . XIGDUO XR 10-998 MG TB24 Take 1 tablet by mouth twice daily 60 tablet 5   No current facility-administered medications for this encounter.    No Known Allergies  Social History   Socioeconomic History  . Marital status: Married    Spouse name: Not on file  . Number of children: 3  . Years of education: GED  . Highest education level: Not on file  Occupational History  . Occupation: Works for [employer] (state).  Drives a Energy manager: [employer]    Comment: works for [employer] (state)  Tobacco Use  . Smoking status: Former Smoker    Types:  Cigarettes    Quit date: 06/26/1985    Years since quitting: 35.4  . Smokeless tobacco: Never Used  Vaping Use  . Vaping Use: Never used  Substance and Sexual Activity  . Alcohol use: No    Comment:    . Drug use: No  . Sexual activity: Not on file  Other Topics Concern  . Not on file  Social History Narrative   Lives with wife in a one story   Right handed    One story home   3 children  Social Determinants of Health   Financial Resource Strain: Not on file  Food Insecurity: Not on file  Transportation Needs: Not on file  Physical Activity: Not on file  Stress: Not on file  Social Connections: Not on file  Intimate Partner Violence: Not on file     ROS- All systems are reviewed and negative except as per the HPI above.  Physical Exam: Vitals:   11/16/20 0950  BP: (!) 142/80  Pulse: 63  Weight: 100.2 kg  Height: '5\' 11"'  (1.803 m)    GEN- The patient is a well appearing obese male, alert and oriented x 3 today.   Head- normocephalic, atraumatic Eyes-  Sclera clear, conjunctiva pink Ears- hearing intact Oropharynx- clear Neck- supple  Lungs- Clear to ausculation bilaterally, normal work of breathing Heart- Regular rate and rhythm, no murmurs, rubs or gallops  GI- soft, NT, ND, + BS Extremities- no clubbing, cyanosis, or edema MS- no significant deformity or atrophy Skin- no rash or lesion Psych- euthymic mood, full affect Neuro- strength and sensation are intact  Wt Readings from Last 3 Encounters:  11/16/20 100.2 kg  09/02/20 98.4 kg  04/29/20 99 kg    EKG today demonstrates  SR Vent. rate 63 BPM PR interval 152 ms QRS duration 90 ms QT/QTcB 414/423 ms  Echo 03/25/14 demonstrated  - Left ventricle: The cavity size was normal. Systolic function was  normal. Wall motion was normal; there were no regional wall  motion abnormalities. Left ventricular diastolic function  parameters were normal.  - Aortic valve: There was no  regurgitation.  - Aortic root: The aortic root was normal in size.  - Mitral valve: Structurally normal valve. There was no  regurgitation.  - Left atrium: The atrium was normal in size.  - Right ventricle: Systolic function was normal.  - Right atrium: The atrium was normal in size.  - Tricuspid valve: There was no regurgitation.  - Pulmonic valve: There was no regurgitation.  - Pulmonary arteries: Systolic pressure was within the normal  range.  - Pericardium, extracardiac: There was no pericardial effusion.   Impressions:   - Normal study.    Epic records are reviewed at length today  CHA2DS2-VASc Score = 2  The patient's score is based upon: CHF History: No HTN History: Yes Diabetes History: Yes Stroke History: No Vascular Disease History: No Age Score: 0 Gender Score: 0      ASSESSMENT AND PLAN: 1. Paroxysmal Atrial Fibrillation (ICD10:  I48.0) The patient's CHA2DS2-VASc score is 2, indicating a 2.2% annual risk of stroke.   S/p afib ablation 03/24/14 Patient appears to be maintaining SR.  Continue Eliquis 5 mg BID. Check CBC/bmet  2. Secondary Hypercoagulable State (ICD10:  D68.69) The patient is at significant risk for stroke/thromboembolism based upon his CHA2DS2-VASc Score of 2.  Continue Apixaban (Eliquis).   3. Obesity Body mass index is 30.8 kg/m. Lifestyle modification was discussed at length including regular exercise and weight reduction.  4. HTN Stable, no changes today.   Follow up in the AF clinic in one year.    [facility] [address] [place], [place] [address] [phone] 11/16/2020 9:58 AM

## 2020-11-16 ENCOUNTER — Other Ambulatory Visit: Payer: Self-pay

## 2020-11-16 ENCOUNTER — Encounter (HOSPITAL_COMMUNITY): Payer: Self-pay | Admitting: Physician Assistant

## 2020-11-16 ENCOUNTER — Ambulatory Visit (HOSPITAL_COMMUNITY)
Admission: RE | Admit: 2020-11-16 | Discharge: 2020-11-16 | Disposition: A | Discharge status: Home / Self Care | Payer: BC Managed Care – PPO | Source: Ambulatory Visit | Attending: Physician Assistant | Admitting: Physician Assistant

## 2020-11-16 VITALS — BP 142/80 | HR 63 | Ht 71.0 in | Wt 220.8 lb

## 2020-11-16 DIAGNOSIS — E118 Type 2 diabetes mellitus with unspecified complications: Secondary | ICD-10-CM | POA: Insufficient documentation

## 2020-11-16 DIAGNOSIS — D6869 Other thrombophilia: Secondary | ICD-10-CM | POA: Diagnosis not present

## 2020-11-16 DIAGNOSIS — Z87891 Personal history of nicotine dependence: Secondary | ICD-10-CM | POA: Insufficient documentation

## 2020-11-16 DIAGNOSIS — Z794 Long term (current) use of insulin: Secondary | ICD-10-CM | POA: Diagnosis not present

## 2020-11-16 DIAGNOSIS — Z7901 Long term (current) use of anticoagulants: Secondary | ICD-10-CM | POA: Diagnosis not present

## 2020-11-16 DIAGNOSIS — Z79899 Other long term (current) drug therapy: Secondary | ICD-10-CM | POA: Diagnosis not present

## 2020-11-16 DIAGNOSIS — Z6830 Body mass index (BMI) 30.0-30.9, adult: Secondary | ICD-10-CM | POA: Diagnosis not present

## 2020-11-16 DIAGNOSIS — I480 Paroxysmal atrial fibrillation: Secondary | ICD-10-CM | POA: Insufficient documentation

## 2020-11-16 DIAGNOSIS — E669 Obesity, unspecified: Secondary | ICD-10-CM | POA: Insufficient documentation

## 2020-11-16 DIAGNOSIS — Z8249 Family history of ischemic heart disease and other diseases of the circulatory system: Secondary | ICD-10-CM | POA: Diagnosis not present

## 2020-11-16 DIAGNOSIS — I10 Essential (primary) hypertension: Secondary | ICD-10-CM | POA: Insufficient documentation

## 2020-11-16 LAB — CBC
HCT: 50.8 % (ref 39.0–52.0)
Hemoglobin: 15.9 g/dL (ref 13.0–17.0)
MCH: 27 pg (ref 26.0–34.0)
MCHC: 31.3 g/dL (ref 30.0–36.0)
MCV: 86.2 fL (ref 80.0–100.0)
Platelets: 247 10*3/uL (ref 150–400)
RBC: 5.89 MIL/uL — ABNORMAL HIGH (ref 4.22–5.81)
RDW: 13.9 % (ref 11.5–15.5)
WBC: 5.4 10*3/uL (ref 4.0–10.5)
nRBC: 0 % (ref 0.0–0.2)

## 2020-11-16 LAB — BASIC METABOLIC PANEL
Anion gap: 7 (ref 5–15)
BUN: 13 mg/dL (ref 8–23)
CO2: 26 mmol/L (ref 22–32)
Calcium: 9 mg/dL (ref 8.9–10.3)
Chloride: 106 mmol/L (ref 98–111)
Creatinine, Ser: 0.89 mg/dL (ref 0.61–1.24)
GFR, Estimated: >60 mL/min (ref >60)
Glucose, Bld: 112 mg/dL — ABNORMAL HIGH (ref 70–99)
Potassium: 4.3 mmol/L (ref 3.5–5.1)
Sodium: 139 mmol/L (ref 135–145)

## 2020-11-22 ENCOUNTER — Telehealth: Payer: Self-pay | Admitting: Internal Medicine

## 2020-11-23 NOTE — Telephone Encounter (Signed)
PDMP okay, Rx sent 

## 2020-11-23 NOTE — Telephone Encounter (Signed)
Requesting: Lyrica 75mg  Contract: None UDS: None Last Visit: 03/12/2020 Next Visit: None Last Refill: 05/25/2020 #90 and 5RF Pt sig: 1 tab tid   Please Advise

## 2020-12-13 ENCOUNTER — Telehealth: Payer: Self-pay | Admitting: Internal Medicine

## 2020-12-13 ENCOUNTER — Telehealth: Payer: Self-pay

## 2020-12-13 NOTE — Telephone Encounter (Signed)
Pt states he took the home covid test and was positive on Saturday, pt would like to know if he needs to get the anti-viral drug. Please advise

## 2020-12-13 NOTE — Telephone Encounter (Signed)
Nurse Assessment Nurse: Murphy, RN, Kacie Date/Time (Eastern Time): 12/12/2020 8:59:48 AM Confirm and document reason for call. If symptomatic, describe symptoms. ---Caller states they are covid+ and wants the anti viral paxlovid. States symptoms started on thursday. States tested positive yesterday evening with a home test. States is having cold symptoms. Denies fever. Does the patient have any new or worsening symptoms? ---Yes Will a triage be completed? ---Yes Related visit to physician within the last 2 weeks? ---No Does the PT have any chronic conditions? (i.e. diabetes, asthma, this includes High risk factors for pregnancy, etc.) ---No Is this a behavioral health or substance abuse call? ---No Guidelines Guideline Title Affirmed Question Affirmed Notes Nurse Date/Time (Eastern Time) COVID-19 - Diagnosed or Suspected [1] COVID-19 diagnosed by positive lab test (e.g., PCR, rapid self-test kit) AND [2] mild symptoms (e.g., cough, fever, others) AND [3] no complications or SOB Murphy, RN, Kacie 12/12/2020 9:01:41 AM PLEASE NOTE: All timestamps contained within this report are represented as Eastern Standard Time. CONFIDENTIALTY NOTICE: This fax transmission is intended only for the addressee. It contains information that is legally privileged, confidential or otherwise protected from use or disclosure. If you are not the intended recipient, you are strictly prohibited from reviewing, disclosing, copying using or disseminating any of this information or taking any action in reliance on or regarding this information. If you have received this fax in error, please notify us immediately by telephone so that we can arrange for its return to us. Phone: [phone], Toll-Free: [phone], Fax: [phone] Page: 2 of 2 Call Id: [id number] Disp. Time (Eastern Time) Disposition Final User 12/12/2020 8:50:22 AM Send To RN Personal Baxter, RN, Sean 12/12/2020 8:50:32 AM Send To RN  Personal Baxter, RN, Sean 12/12/2020 9:04:30 AM Home Care Yes Murphy, RN, Kacie Caller Disagree/Comply Comply Caller Understands Yes PreDisposition Call Doctor Care Advice Given Per Guideline HOME CARE: * You should be able to treat this at home. REASSURANCE AND EDUCATION - POSITIVE COVID-19 LAB TEST AND MILD SYMPTOMS: * You had a recent lab test for COVID-19 and it came back positive. GENERAL CARE ADVICE FOR COVID-19 SYMPTOMS: * The symptoms are generally treated the same whether you have COVID-19, influenza or some other respiratory virus. * Feeling dehydrated: Drink extra liquids. If the air in your home is dry, use a humidifier. * Fever: For fever over 101 F (38.3 C), take acetaminophen every 4 to 6 hours (Adults 650 mg) OR ibuprofen every 6 to 8 hours (Adults 400 mg). Before taking any medicine, read all the instructions on the package. Do not take aspirin unless your doctor has prescribed it for you. * Muscle aches, headache, and other pains: Often this comes and goes with the fever. Take acetaminophen every 4 to 6 hours (Adults 650 mg) OR ibuprofen every 6 to 8 hours (Adults 400 mg). Before taking any medicine, read all the instructions on the package. COUGH MEDICINES: * COUGH SYRUP WITH DEXTROMETHORPHAN: An over-the-counter cough syrup can help your cough. The most common cough suppressant in over-the-counter cough medicines is dextromethorphan. CALL BACK IF: * Fever over 103 F (39.4 C) * Fever lasts over 3 days * Fever returns after being gone for 24 hours * Chest pain or difficulty breathing occurs * You become worse CARE ADVICE given per COVID-19 - DIAGNOSED OR SUSPECTED (Adult) guideline   Virtual visit scheduled 12/14/2020 

## 2020-12-13 NOTE — Telephone Encounter (Signed)
Appt scheduled

## 2020-12-13 NOTE — Telephone Encounter (Signed)
Pt needs virtual visit

## 2020-12-14 ENCOUNTER — Encounter: Payer: Self-pay | Admitting: Internal Medicine

## 2020-12-14 ENCOUNTER — Telehealth (INDEPENDENT_AMBULATORY_CARE_PROVIDER_SITE_OTHER): Payer: BC Managed Care – PPO | Admitting: Internal Medicine

## 2020-12-14 ENCOUNTER — Other Ambulatory Visit: Payer: Self-pay

## 2020-12-14 VITALS — BP 140/81 | HR 66 | Ht 71.0 in | Wt 220.0 lb

## 2020-12-14 DIAGNOSIS — U071 COVID-19: Secondary | ICD-10-CM | POA: Diagnosis not present

## 2020-12-14 MED ORDER — MOLNUPIRAVIR EUA 200MG CAPSULE: 4.0000 | ORAL_CAPSULE | Freq: Two times a day (BID) | ORAL | 0 refills | Status: AC

## 2020-12-14 MED ORDER — AZELASTINE HCL 0.1 % NA SOLN: 2.0000 | Freq: Two times a day (BID) | NASAL | 5 refills | Status: AC

## 2020-12-14 NOTE — Progress Notes (Signed)
Subjective:    Patient ID: Danny [name], male    DOB: 12/26/1960, 60 y.o.   MRN: [id number]  DOS:  12/14/2020 Type of visit - description: Virtual Visit via Video Note  I connected with the above patient  by a video enabled telemedicine application and verified that I am speaking with the correct person using two identifiers.   THIS ENCOUNTER IS A VIRTUAL VISIT DUE TO COVID-19 - PATIENT WAS NOT SEEN IN THE OFFICE. PATIENT HAS CONSENTED TO VIRTUAL VISIT / TELEMEDICINE VISIT   Location of patient: home  Location of provider: office  Persons participating in the virtual visit: patient, provider   I discussed the limitations of evaluation and management by telemedicine and the availability of in person appointments. The patient expressed understanding and agreed to proceed.  Acute Symptoms a started 5 days ago: Sinus congestion, generalized aches and pains including neck and head. Some sore throat. Some diarrhea. Wife was diagnosed with COVID recently, Domenic tested +3 days ago.  He denies fever chills No chest pain or difficulty breathing + Cough with some sputum production.  Review of Systems See above   Past Medical History:  Diagnosis Date   Allergic rhinitis    Benign prostatic hypertrophy    s/p TUNA procedure aprx 2006,   Carpal tunnel syndrome 06/17/2009   Diabetes mellitus    Type II dx 1999   DM type 2 with diabetic peripheral neuropathy ([place]) 10/15/2014   ED (erectile dysfunction)    After TUNA   Elevated LFTs 2010   (-) chronic hep  panel   History of cardiovascular stress test 8/11   Negative   Hyperlipidemia    HYPERSOMNIA UNSPECIFIED 12/11/2007   Hypertension    Obesity    PAF (paroxysmal atrial fibrillation) ([place]) 12/21/2009   a. Failed Multaq. b. Prev on flecainide. c. s/p afib ablation 2015.    Past Surgical History:  Procedure Laterality Date   ABLATION  03-24-14   PVI by Dr Rayann Heman   ATRIAL FIBRILLATION ABLATION N/A 03/24/2014   Procedure:  ATRIAL FIBRILLATION ABLATION;  Surgeon: Coralyn Mark, MD;  Location: [facility] CATH LAB;  Service: Cardiovascular;  Laterality: N/A;   CYSTOSCOPY  11-09   neg  @ urology, was rx Flomax   PROSTATE SURGERY     TUNA   TEE WITHOUT CARDIOVERSION N/A 03/23/2014   Procedure: TRANSESOPHAGEAL ECHOCARDIOGRAM (TEE);  Surgeon: Fay Records, MD;  Location: [facility] ENDOSCOPY;  Service: Cardiovascular;  Laterality: N/A;   VASECTOMY     several years ago    Allergies as of 12/14/2020   No Known Allergies      Medication List        Accurate as of December 14, 2020  4:24 PM. If you have any questions, ask your nurse or doctor.          acetaminophen 650 MG CR tablet Commonly known as: TYLENOL Take 650 mg by mouth every 8 (eight) hours as needed for pain.   azelastine 0.1 % nasal spray Commonly known as: ASTELIN Place 2 sprays into both nostrils 2 (two) times daily. Use in each nostril as directed What changed: See the new instructions. Changed by: [name], MD   BD Veo Insulin Syringe U/F 31G X 15/64" 0.5 ML Misc Generic drug: Insulin Syringe-Needle U-100 USE 1 SYRINGE AS DIRECTED 3 TO 4 TIMES DAILY   Eliquis 5 MG Tabs tablet Generic drug: apixaban TAKE 1 TABLET(5 MG) BY MOUTH TWICE DAILY   fish oil-omega-3 fatty  acids 1000 MG capsule Take 2 g by mouth 2 (two) times daily.   fluticasone 50 MCG/ACT nasal spray Commonly known as: FLONASE SHAKE LIQUID AND USE 2 SPRAYS IN EACH NOSTRIL DAILY AS NEEDED FOR ALLERGIES OR RHINITIS   FreeStyle Control Solution Liqd Use to calibrate freestyle [employer] 14 Day Sensor Misc 1 Units by Does not apply route every 14 (fourteen) days.   glucagon 1 MG injection Inject 1 mg into the vein once as needed.   Insulin Pen Needle 31G X 8 MM Misc Commonly known as: B-D ULTRAFINE III SHORT PEN Use one per day   Insulin Pen Needle 32G X 4 MM Misc Commonly known as: BD Pen Needle Nano U/F Use to give insulin injects 4 times daily.   loratadine 10  MG tablet Commonly known as: CLARITIN Take 10 mg by mouth daily.   molnupiravir EUA 200 mg Caps Take 4 capsules (800 mg total) by mouth 2 (two) times daily for 5 days. Started by: [name], MD   NovoLOG FlexPen 100 UNIT/ML FlexPen Generic drug: insulin aspart INJECT 10 TO 12 UNITS SUBCUTANEOUSLY THREE TIMES DAILY BEFORE MEAL(S)   OneTouch Delica Plus [id number] Misc Use to check blood sugar 4 times per day   OneTouch Verio Reflect w/Device Kit 1   OneTouch Verio test strip Generic drug: glucose blood Use as instructed to check blood sugar 4 times per day dx code E11.9   Polyethyl Glyc-Propyl Glyc PF 0.4-0.3 % Soln Apply 2 drops to eye 3 (three) times daily as needed (for dry eyes).   pregabalin 75 MG capsule Commonly known as: LYRICA TAKE 1 CAPSULE BY MOUTH THREE TIMES DAILY   simvastatin 20 MG tablet Commonly known as: ZOCOR Take 0.5 tablets (10 mg total) by mouth at bedtime.   tamsulosin 0.4 MG Caps capsule Commonly known as: FLOMAX Take 1 capsule (0.4 mg total) by mouth daily.   Tyler Aas FlexTouch 100 UNIT/ML FlexTouch Pen Generic drug: insulin degludec INJECT 40 UNITS SUBCUTANEOUSLY AT BEDTIME   Xigduo XR 10-998 MG Tb24 Generic drug: Dapagliflozin-metFORMIN HCl ER Take 1 tablet by mouth twice daily           Objective:   Physical Exam BP 140/81   Pulse 66   Ht 5' 11" (1.803 m)   Wt 220 lb (99.8 kg)   SpO2 96%   BMI 30.68 kg/m  This is a virtual video visit, alert oriented x3, in no distress.  Speaking in complete sentences.    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  Carotid US negative (01-2020)  PLAN COVID-19: The patient had 2 COVID vaccines, symptoms a started 5  days ago, tested +3 days ago. Vital signs are stable. Attempted to prescribe Paxlovid but will require him to stop anticoagulation consequently plan is: Molnupiravir, rest, fluids, Tylenol, Mucinex DM, continue Flonase, add Astelin. Definitely call if not improving, watch O2 sat, goal to be over 94%. ER if symptoms severe. He verbalized understanding of all of the above.        I discussed the assessment and treatment plan with the patient. The patient was provided an opportunity to ask questions and all were answered. The patient agreed with the plan and demonstrated an  understanding of the instructions.   The patient was advised to call back or seek an in-person evaluation if the symptoms worsen or if the condition fails to improve as anticipated.

## 2020-12-15 NOTE — Assessment & Plan Note (Signed)
COVID-19: The patient had 2 COVID vaccines, symptoms a started 5 days ago, tested +3 days ago. Vital signs are stable. Attempted to prescribe Paxlovid but will require him to stop anticoagulation consequently plan is: Molnupiravir, rest, fluids, Tylenol, Mucinex DM, continue Flonase, add Astelin. Definitely call if not improving, watch O2 sat, goal to be over 94%. ER if symptoms severe. He verbalized understanding of all of the above.

## 2020-12-24 ENCOUNTER — Other Ambulatory Visit: Payer: Self-pay | Admitting: Endocrinology

## 2020-12-31 ENCOUNTER — Other Ambulatory Visit: Payer: Self-pay

## 2020-12-31 ENCOUNTER — Other Ambulatory Visit: Payer: BC Managed Care – PPO

## 2020-12-31 ENCOUNTER — Other Ambulatory Visit (INDEPENDENT_AMBULATORY_CARE_PROVIDER_SITE_OTHER): Payer: BC Managed Care – PPO

## 2020-12-31 DIAGNOSIS — E1142 Type 2 diabetes mellitus with diabetic polyneuropathy: Secondary | ICD-10-CM | POA: Diagnosis not present

## 2020-12-31 LAB — HEMOGLOBIN A1C: Hgb A1c MFr Bld: 6.7 % — ABNORMAL HIGH (ref 4.6–6.5)

## 2020-12-31 LAB — BASIC METABOLIC PANEL
BUN: 18 mg/dL (ref 6–23)
CO2: 28 mEq/L (ref 19–32)
Calcium: 9.1 mg/dL (ref 8.4–10.5)
Chloride: 103 mEq/L (ref 96–112)
Creatinine, Ser: 1.02 mg/dL (ref 0.40–1.50)
GFR: 79.07 mL/min (ref >60.00)
Glucose, Bld: 161 mg/dL — ABNORMAL HIGH (ref 70–99)
Potassium: 4.4 mEq/L (ref 3.5–5.1)
Sodium: 140 mEq/L (ref 135–145)

## 2021-01-03 ENCOUNTER — Other Ambulatory Visit: Payer: BC Managed Care – PPO

## 2021-01-04 ENCOUNTER — Encounter: Payer: Self-pay | Admitting: Gastroenterology

## 2021-01-05 ENCOUNTER — Encounter: Payer: Self-pay | Admitting: Gastroenterology

## 2021-01-06 ENCOUNTER — Encounter: Payer: Self-pay | Admitting: Endocrinology

## 2021-01-06 ENCOUNTER — Ambulatory Visit: Payer: BC Managed Care – PPO | Admitting: Endocrinology

## 2021-01-06 ENCOUNTER — Other Ambulatory Visit: Payer: Self-pay

## 2021-01-06 VITALS — BP 142/76 | HR 68 | Ht 71.0 in | Wt 218.0 lb

## 2021-01-06 DIAGNOSIS — Z794 Long term (current) use of insulin: Secondary | ICD-10-CM

## 2021-01-06 DIAGNOSIS — I10 Essential (primary) hypertension: Secondary | ICD-10-CM

## 2021-01-06 DIAGNOSIS — E1165 Type 2 diabetes mellitus with hyperglycemia: Secondary | ICD-10-CM | POA: Diagnosis not present

## 2021-01-06 MED ORDER — OLMESARTAN MEDOXOMIL 5 MG PO TABS: 10.0000 mg | ORAL_TABLET | Freq: Every day | ORAL | 2 refills | Status: DC

## 2021-01-06 NOTE — Progress Notes (Signed)
Patient ID: Danny [name], male   DOB: 18-Oct-1960, 60 y.o.   MRN: [id number]    Reason for Appointment: Followup for Type 2 Diabetes  Referring physician: Larose Kells  History of Present Illness:          Diagnosis: Type 2 diabetes mellitus, date of diagnosis: 1992        Past history:  He has had long-standing diabetes and previously was treated with metformin, Amaryl, Januvia and Actos He was taken off metformin because of fear of liver problems.  May have been taken taken off Actos because of swelling of his legs but was on 45 mg at that time Over the last 2 years at least his A1c has been consistently over 8% He was started on insulin in 2011 probably when his A1c was over 9% Initially was given Lantus and Humalog and subsequently switched to 70/30 twice a day for unknown reasons Has not taken Byetta or Victoza in the past He had been on 70/30 insulin initially for a few years Previously A1c was 8% or more consistently before starting Invokamet. He was switched from Lantus to [place] in 1/17  Recent history:   INSULIN regimen is described as:  Tresiba 45 units at 9 pm Novolog 8-12 units before breakfast, 10-15 ac supper  Oral hypoglycemic drugs the patient is taking are: Xigduo, 10/998, 1 tablet twice a day  His A1c has been usually in the upper normal range, now 6.7 compared to 6.4    Current management, blood sugar patterns and problems identified: He is now using the freestyle libre sensor However he has had some sensors that do not work He also has had to pay about $80 a month for the sensors Currently using a clear bandage to hold the sensors in place on his arm He thinks his blood sugars have been more variable when he had COVID infection 3 to 4 weeks ago He is taking his Xigduo twice a day and he feels that this works better during the day than taking 2 tablets in the evening His current job does not require as much physical activity Blood sugar patterns are  discussed below but he tends to have higher fasting readings, dawn phenomenon and occasionally higher readings after breakfast or lunch  Usually not taking any coverage for lunch even though sometimes blood sugars appear to go up especially on the previous long weekend        Side effects from medications have been: None  Compliance with the medical regimen: Good  Glucose monitoring:  done 3-4 times a day         Glucometer: One Touch Verio.       Blood Glucose patterns:  His blood sugars are the lowest around midnight and highest after breakfast has fair amount of variability late morning and early afternoon. HYPERGLYCEMIC episodes occur occasionally midmorning or early afternoon but not consistently Blood sugars appear to be decreasing after bedtime and not consistently Hypoglycemia has been minimal and transient, once at midnight and another at 4 PM Overnight blood sugars as above are lower after 10 PM but start rising about 12-1 AM   CGM use % of time 89  2-week average/GV 142  Time in range        85%  % Time Above 180 14  % Time above 250   % Time Below 70 1     PRE-MEAL Fasting Lunch Dinner Bedtime Overall  Glucose range:  Averages: 141 132 154  142   POST-MEAL PC Breakfast PC Lunch PC Dinner  Glucose range:     Averages: 160 151 155   Prior   PRE-MEAL Fasting Lunch Dinner Bedtime Overall  Glucose range:  115-160  97-119  87-160    Mean/median:  133  118  100   121   POST-MEAL PC Breakfast PC Lunch PC Dinner  Glucose range:    103-158  Mean/median:    131     Glycemic control:   Lab Results  Component Value Date   HGBA1C 6.7 (H) 12/31/2020   HGBA1C 6.4 08/25/2020   HGBA1C 6.5 04/26/2020   Lab Results  Component Value Date   MICROALBUR <0.7 08/25/2020   LDLCALC 40 04/26/2020   CREATININE 1.02 12/31/2020    Self-care:  Meals: 3 meals per day. Breakfast is eggs or cereal usually,  controlling portions      Dietician visit: Most recent: years  ago.                 Weight history:  Wt Readings from Last 3 Encounters:  01/06/21 218 lb (98.9 kg)  12/14/20 220 lb (99.8 kg)  11/16/20 220 lb 12.8 oz (100.2 kg)    Lab on 12/31/2020  Component Date Value Ref Range Status   Sodium 12/31/2020 140  135 - 145 mEq/L Final   Potassium 12/31/2020 4.4  3.5 - 5.1 mEq/L Final   Chloride 12/31/2020 103  96 - 112 mEq/L Final   CO2 12/31/2020 28  19 - 32 mEq/L Final   Glucose, Bld 12/31/2020 161 (A) 70 - 99 mg/dL Final   BUN 12/31/2020 18  6 - 23 mg/dL Final   Creatinine, Ser 12/31/2020 1.02  0.40 - 1.50 mg/dL Final   GFR 12/31/2020 79.07  >60.00 mL/min Final   Calculated using the CKD-EPI Creatinine Equation (2021)   Calcium 12/31/2020 9.1  8.4 - 10.5 mg/dL Final   Hgb A1c MFr Bld 12/31/2020 6.7 (A) 4.6 - 6.5 % Final   Glycemic Control Guidelines for People with Diabetes:Non Diabetic:  <6%Goal of Therapy: <7%Additional Action Suggested:  >8%     Allergies as of 01/06/2021   No Known Allergies      Medication List        Accurate as of January 06, 2021 10:32 AM. If you have any questions, ask your nurse or doctor.          acetaminophen 650 MG CR tablet Commonly known as: TYLENOL Take 650 mg by mouth every 8 (eight) hours as needed for pain.   azelastine 0.1 % nasal spray Commonly known as: ASTELIN Place 2 sprays into both nostrils 2 (two) times daily. Use in each nostril as directed   BD Veo Insulin Syringe U/F 31G X 15/64" 0.5 ML Misc Generic drug: Insulin Syringe-Needle U-100 USE 1 SYRINGE AS DIRECTED 3 TO 4 TIMES DAILY   Eliquis 5 MG Tabs tablet Generic drug: apixaban TAKE 1 TABLET(5 MG) BY MOUTH TWICE DAILY   fish oil-omega-3 fatty acids 1000 MG capsule Take 2 g by mouth 2 (two) times daily.   fluticasone 50 MCG/ACT nasal spray Commonly known as: FLONASE SHAKE LIQUID AND USE 2 SPRAYS IN EACH NOSTRIL DAILY AS NEEDED FOR ALLERGIES OR RHINITIS   FreeStyle Control Solution Liqd Use to calibrate freestyle [employer] 14 Day Sensor Misc 1 Units by Does not apply route every 14 (fourteen) days.   glucagon 1 MG injection Inject 1 mg into the  vein once as needed.   Insulin Pen Needle 31G X 8 MM Misc Commonly known as: B-D ULTRAFINE III SHORT PEN Use one per day   Insulin Pen Needle 32G X 4 MM Misc Commonly known as: BD Pen Needle Nano U/F Use to give insulin injects 4 times daily.   loratadine 10 MG tablet Commonly known as: CLARITIN Take 10 mg by mouth daily.   NovoLOG FlexPen 100 UNIT/ML FlexPen Generic drug: insulin aspart INJECT 10 TO 12 UNITS SUBCUTANEOUSLY THREE TIMES DAILY BEFORE MEAL(S)   OneTouch Delica Plus [id number] Misc Use to check blood sugar 4 times per day   OneTouch Verio Reflect w/Device Kit 1   OneTouch Verio test strip Generic drug: glucose blood Use as instructed to check blood sugar 4 times per day dx code E11.9   Polyethyl Glyc-Propyl Glyc PF 0.4-0.3 % Soln Apply 2 drops to eye 3 (three) times daily as needed (for dry eyes).   pregabalin 75 MG capsule Commonly known as: LYRICA TAKE 1 CAPSULE BY MOUTH THREE TIMES DAILY   simvastatin 20 MG tablet Commonly known as: ZOCOR Take 0.5 tablets (10 mg total) by mouth at bedtime.   tamsulosin 0.4 MG Caps capsule Commonly known as: FLOMAX Take 1 capsule (0.4 mg total) by mouth daily.   Tyler Aas FlexTouch 100 UNIT/ML FlexTouch Pen Generic drug: insulin degludec INJECT 40 UNITS SUBCUTANEOUSLY AT BEDTIME   Xigduo XR 10-998 MG Tb24 Generic drug: Dapagliflozin-metFORMIN HCl ER Take 1 tablet by mouth twice daily        Allergies:  No Known Allergies  Past Medical History:  Diagnosis Date   Allergic rhinitis    Benign prostatic hypertrophy    s/p TUNA procedure aprx 2006,   Carpal tunnel syndrome 06/17/2009   Diabetes mellitus    Type II dx 1999   DM type 2 with diabetic peripheral neuropathy ([place]) 10/15/2014   ED (erectile dysfunction)    After TUNA   Elevated LFTs 2010   (-) chronic hep   panel   History of cardiovascular stress test 8/11   Negative   Hyperlipidemia    HYPERSOMNIA UNSPECIFIED 12/11/2007   Hypertension    Obesity    PAF (paroxysmal atrial fibrillation) ([place]) 12/21/2009   a. Failed Multaq. b. Prev on flecainide. c. s/p afib ablation 2015.    Past Surgical History:  Procedure Laterality Date   ABLATION  03-24-14   PVI by Dr Rayann Heman   ATRIAL FIBRILLATION ABLATION N/A 03/24/2014   Procedure: ATRIAL FIBRILLATION ABLATION;  Surgeon: Coralyn Mark, MD;  Location: [facility];  Service: Cardiovascular;  Laterality: N/A;   CYSTOSCOPY  11-09   neg  @ urology, was rx Flomax   PROSTATE SURGERY     TUNA   TEE WITHOUT CARDIOVERSION N/A 03/23/2014   Procedure: TRANSESOPHAGEAL ECHOCARDIOGRAM (TEE);  Surgeon: Fay Records, MD;  Location: [facility] ENDOSCOPY;  Service: Cardiovascular;  Laterality: N/A;   VASECTOMY     several years ago    Family History  Problem Relation Age of Onset   Heart disease Mother    Diabetes Mother    Hypertension Mother    Congestive Heart Failure Mother    Heart attack Mother    Hypertension Father    Colon cancer Neg Hx    Prostate cancer Neg Hx     Social History:  reports that he quit smoking about 35 years ago. His smoking use included cigarettes. He has never used smokeless tobacco. He reports that he does not drink alcohol  and does not use drugs.    Review of Systems       Lipids: He has been on low dose simvastatin long-term, Followed by PCP Has good control except slightly low HDL  Labs as below:      Lab Results  Component Value Date   CHOL 97 04/26/2020   HDL 36.10 (L) 04/26/2020   LDLCALC 40 04/26/2020   LDLDIRECT 68.0 03/06/2018   TRIG 105.0 04/26/2020   CHOLHDL 3 04/26/2020               No history of hypertension Occasionally monitoring at home and he thinks his systolic reading is around 140 or more at home  BP Readings from Last 3 Encounters:  01/06/21 (!) 142/76  12/14/20 140/81  11/16/20 (!) 142/80       Neuropathy: Has longstanding history of Numbness, pins-and-needle sensation and stinging in feet and lower legs He has been followed by neurologist, taking 75 mg, 3 daily of Lyrica Symptoms are mostly controlled  No sensory loss on exam     LABS:  Lab on 12/31/2020  Component Date Value Ref Range Status   Sodium 12/31/2020 140  135 - 145 mEq/L Final   Potassium 12/31/2020 4.4  3.5 - 5.1 mEq/L Final   Chloride 12/31/2020 103  96 - 112 mEq/L Final   CO2 12/31/2020 28  19 - 32 mEq/L Final   Glucose, Bld 12/31/2020 161 (A) 70 - 99 mg/dL Final   BUN 12/31/2020 18  6 - 23 mg/dL Final   Creatinine, Ser 12/31/2020 1.02  0.40 - 1.50 mg/dL Final   GFR 12/31/2020 79.07  >60.00 mL/min Final   Calculated using the CKD-EPI Creatinine Equation (2021)   Calcium 12/31/2020 9.1  8.4 - 10.5 mg/dL Final   Hgb A1c MFr Bld 12/31/2020 6.7 (A) 4.6 - 6.5 % Final   Glycemic Control Guidelines for People with Diabetes:Non Diabetic:  <6%Goal of Therapy: <7%Additional Action Suggested:  >8%     Physical Examination:  BP (!) 142/76   Pulse 68   Ht _0  (1.803 m)   Wt 218 lb (98.9 kg)   SpO2 96%   BMI 30.40 kg/m    ASSESSMENT/PLAN:     Diabetes type 2 on insulin  See history of present illness for detailed discussion of current diabetes management, blood sugar patterns and problems identified  His A1c is still fairly good although slightly higher at 6.7  He is on basal bolus insulin with Xigduo Monitoring blood sugars with freestyle libre now  This appears to be reasonably accurate but is having technical difficulties with the sensors  Usually not having postprandial hyperglycemia Fasting readings tend to be mildly increased and also has some dawn phenomenon As discussed above is not always covering his lunch meal and may have high readings depending on his intake and especially if he is not as active after lunch He will stay on the same basic insulin doses including Tresiba and  adjust mealtime dose based on meal size and activity level He will need at least 4 to 6 units to cover his lunch meal if having any carbohydrates and not being active  He is interested in trying the Dexcom and this may be more reliable for him, prescription will be sent  Possible hypertension: Blood pressure is higher consistently and he will start taking Benicar 10 mg daily  He will follow up with his PCP and also try to check more regularly at home  Follow-up in 4 months  There are no Patient Instructions on file for this visit.      Elayne Snare 01/06/2021, 10:32 AM   Note: This office note was prepared with Dragon voice recognition system technology. Any transcriptional errors that result from this process are unintentional. .

## 2021-01-07 ENCOUNTER — Other Ambulatory Visit: Payer: Self-pay | Admitting: Endocrinology

## 2021-01-07 MED ORDER — DEXCOM G6 SENSOR MISC: 3 refills | Status: DC

## 2021-01-07 MED ORDER — DEXCOM G6 TRANSMITTER MISC: 1.0000 | Freq: Once | 1 refills | Status: AC

## 2021-01-08 ENCOUNTER — Other Ambulatory Visit: Payer: Self-pay | Admitting: Internal Medicine

## 2021-01-10 NOTE — Telephone Encounter (Signed)
Pt's age 60, wt 98.9 kg, SCr 1.02, CrCl 106.39, last ov w/ CF 11/16/20.

## 2021-01-11 ENCOUNTER — Telehealth: Payer: Self-pay | Admitting: Pharmacy Technician

## 2021-01-11 NOTE — Telephone Encounter (Signed)
[facility] Patient Advocate Encounter  Prior Authorization for [facility] G6 SENSOR AND TRANSMITTER has been approved.    [facility] will continue to follow.   Netty Starring. Dimas Aguas, CPhT Patient Advocate [facility] Phone: [phone] Fax:  [phone]

## 2021-01-13 ENCOUNTER — Telehealth: Payer: Self-pay | Admitting: *Deleted

## 2021-01-13 NOTE — Telephone Encounter (Signed)
   [facility] Pre-operative Risk Assessment    Patient Name: Danny [name]  DOB: 1961-04-03 MRN: [id number]  HEARTCARE STAFF:  - IMPORTANT!!!!!! Under Visit Info/Reason for Call, type in Other and utilize the format Clearance MM/DD/YY or Clearance TBD. Do not use dashes or single digits. - Please review there is not already an duplicate clearance open for this procedure. - If request is for dental extraction, please clarify the # of teeth to be extracted. - If the patient is currently at the dentist's office, call Pre-Op Callback Staff (MA/nurse) to input urgent request.  - If the patient is not currently in the dentist office, please route to the Pre-Op pool.  Request for surgical clearance:  What type of surgery is being performed? EGD  When is this surgery scheduled? 02/03/21  What type of clearance is required (medical clearance vs. Pharmacy clearance to hold med vs. Both)? BOTH  Are there any medications that need to be held prior to surgery and how long? [place] name and name of physician performing surgery? [facility]; DR. Brennan Bailey  What is the office phone number? [phone]   7.   What is the office fax number? [phone]  8.   Anesthesia type (None, local, MAC, general) ? NOT LISTED (PROPOFOL?)   Julaine Hua 01/13/2021, 1:39 PM  _________________________________________________________________   (provider comments below)

## 2021-01-14 ENCOUNTER — Other Ambulatory Visit: Payer: Self-pay | Admitting: Endocrinology

## 2021-01-17 NOTE — Telephone Encounter (Signed)
Patient with diagnosis of atrial fibrillation on Eliquis for anticoagulation.    Procedure: EGD Date of procedure: 02/03/21   CHA2DS2-VASc Score = 2  This indicates a 2.2% annual risk of stroke. The patient's score is based upon: CHF History: No HTN History: Yes Diabetes History: Yes Stroke History: No Vascular Disease History: No Age Score: 0 Gender Score: 0    CrCl 90 Platelet count 247  Per office protocol, patient can hold Eliquis for 2 days prior to procedure.   Patient will not need bridging with Lovenox (enoxaparin) around procedure.  Patient should restart Eliquis on the evening of procedure or day after, at discretion of procedure MD

## 2021-01-17 NOTE — Telephone Encounter (Signed)
   Primary Cardiologist: Olga Millers, MD  Chart reviewed as part of pre-operative protocol coverage. Given past medical history and time since last visit, based on ACC/AHA guidelines, Danny [name] would be at acceptable risk for the planned procedure without further cardiovascular testing.   Patient with diagnosis of atrial fibrillation on Eliquis for anticoagulation.     Procedure: EGD Date of procedure: 02/03/21     CHA2DS2-VASc Score = 2  This indicates a 2.2% annual risk of stroke. The patient's score is based upon: CHF History: No HTN History: Yes Diabetes History: Yes Stroke History: No Vascular Disease History: No Age Score: 0 Gender Score: 0     CrCl 90 Platelet count 247   Per office protocol, patient can hold Eliquis for 2 days prior to procedure.   Patient will not need bridging with Lovenox (enoxaparin) around procedure.   Patient should restart Eliquis on the evening of procedure or day after, at discretion of procedure MD  I will route this recommendation to the requesting party via Epic fax function and remove from pre-op pool.  Please call with questions.  Thomasene Ripple. Danikah Budzik NP-C    01/17/2021, 3:13 PM [facility] [address] Office [phone] Fax [phone]

## 2021-01-25 ENCOUNTER — Telehealth: Payer: Self-pay

## 2021-01-25 ENCOUNTER — Telehealth: Payer: Self-pay | Admitting: Endocrinology

## 2021-01-25 NOTE — Telephone Encounter (Signed)
Pts wife called and asked for an appointment due to edema in the legs, discoloration, and pain. She says Endo started him on Omlesartan on 01/05/2021 and advised her that this might be a s/e.   He was scheduled for 01/26/21 at 4pm.  She was advised that if any SOB or chest pain started then to take him to the ER.

## 2021-01-25 NOTE — Telephone Encounter (Signed)
Pt's wife is calling stating that Dr.Kumar had recently prescribed this medication and now he is having lower extremity swelling and spots of redness on lower legs. Pt wife would like a call back   olmesartan (BENICAR) 5 MG tablet

## 2021-01-25 NOTE — Telephone Encounter (Signed)
Per note w/ endo- Dr. Lucianne Muss does not believe that this is a s/e from the olmesartan, will see him tomorrow as scheduled.

## 2021-01-26 ENCOUNTER — Encounter: Payer: Self-pay | Admitting: Internal Medicine

## 2021-01-26 ENCOUNTER — Other Ambulatory Visit: Payer: Self-pay

## 2021-01-26 ENCOUNTER — Ambulatory Visit: Payer: BC Managed Care – PPO | Admitting: Internal Medicine

## 2021-01-26 VITALS — BP 128/62 | HR 71 | Temp 98.2°F | Resp 18 | Ht 71.0 in | Wt 223.4 lb

## 2021-01-26 DIAGNOSIS — R600 Localized edema: Secondary | ICD-10-CM | POA: Diagnosis not present

## 2021-01-26 DIAGNOSIS — I10 Essential (primary) hypertension: Secondary | ICD-10-CM | POA: Diagnosis not present

## 2021-01-26 NOTE — Progress Notes (Signed)
Subjective:    Patient ID: Danny [name], male    DOB: Mar 16, 1961, 60 y.o.   MRN: [id number]  DOS:  01/26/2021 Type of visit - description: Acute  Endocrinology started Benicar on 01/06/2021, shortly after he started to develop bilateral lower extremity edema. It got progressively worse. Skin at the pretibial area got red but not warm. Eventually he stopped olmesartan approximately 01/15/2021. Since then, leg swelling has gradually improved but is not completely gone.  BP Readings from Last 3 Encounters:  01/26/21 128/62  01/06/21 (!) 142/76  12/14/20 140/81     Review of Systems Denies chest pain or difficulty breathing No lip or tongue swelling No itching No cough  Past Medical History:  Diagnosis Date   Allergic rhinitis    Benign prostatic hypertrophy    s/p TUNA procedure aprx 2006,   Carpal tunnel syndrome 06/17/2009   Diabetes mellitus    Type II dx 1999   DM type 2 with diabetic peripheral neuropathy ([facility]) 10/15/2014   ED (erectile dysfunction)    After TUNA   Elevated LFTs 2010   (-) chronic hep  panel   History of cardiovascular stress test 8/11   Negative   Hyperlipidemia    HYPERSOMNIA UNSPECIFIED 12/11/2007   Hypertension    Obesity    PAF (paroxysmal atrial fibrillation) ([facility]) 12/21/2009   a. Failed Multaq. b. Prev on flecainide. c. s/p afib ablation 2015.    Past Surgical History:  Procedure Laterality Date   ABLATION  03/24/2014   PVI by Dr Johney Frame   ATRIAL FIBRILLATION ABLATION N/A 03/24/2014   Procedure: ATRIAL FIBRILLATION ABLATION;  Surgeon: Gardiner Rhyme, MD;  Location: [facility];  Service: Cardiovascular;  Laterality: N/A;   COLONOSCOPY  11/25/2012   Mild sigmoid diverticulosis, small internal hemorrhoids, otherwise normal colonoscopy   CYSTOSCOPY  04/26/2008   neg  @ urology, was rx Flomax   PROSTATE SURGERY     TUNA   TEE WITHOUT CARDIOVERSION N/A 03/23/2014   Procedure: TRANSESOPHAGEAL ECHOCARDIOGRAM (TEE);  Surgeon: Pricilla Riffle, MD;  Location: [facility] ENDOSCOPY;  Service: Cardiovascular;  Laterality: N/A;   VASECTOMY     several years ago    Allergies as of 01/26/2021   No Known Allergies      Medication List        Accurate as of January 26, 2021 11:59 PM. If you have any questions, ask your nurse or doctor.          STOP taking these medications    olmesartan 5 MG tablet Commonly known as: BENICAR Stopped by: [name], MD       TAKE these medications    acetaminophen 650 MG CR tablet Commonly known as: TYLENOL Take 650 mg by mouth every 8 (eight) hours as needed for pain.   azelastine 0.1 % nasal spray Commonly known as: ASTELIN Place 2 sprays into both nostrils 2 (two) times daily. Use in each nostril as directed   BD Veo Insulin Syringe U/F 31G X 15/64" 0.5 ML Misc Generic drug: Insulin Syringe-Needle U-100 USE 1 SYRINGE AS DIRECTED 3 TO 4 TIMES DAILY   dicyclomine 10 MG capsule Commonly known as: BENTYL Take 1 capsule by mouth 3 (three) times daily before meals.   Eliquis 5 MG Tabs tablet Generic drug: apixaban TAKE 1 TABLET(5 MG) BY MOUTH TWICE DAILY   famotidine 40 MG tablet Commonly known as: PEPCID Take 40 mg by mouth at bedtime.   fish oil-omega-3 fatty acids 1000 MG  capsule Take 2 g by mouth 2 (two) times daily.   fluticasone 50 MCG/ACT nasal spray Commonly known as: FLONASE SHAKE LIQUID AND USE 2 SPRAYS IN EACH NOSTRIL DAILY AS NEEDED FOR ALLERGIES OR RHINITIS   FreeStyle Control Solution Liqd Use to calibrate freestyle [employer] 14 Day Sensor Misc 1 Units by Does not apply route every 14 (fourteen) days.   Dexcom G6 Sensor Misc Use to monitor blood sugar, change after 10 days   glucagon 1 MG injection Inject 1 mg into the vein once as needed.   Insulin Pen Needle 31G X 8 MM Misc Commonly known as: B-D ULTRAFINE III SHORT PEN Use one per day   Insulin Pen Needle 32G X 4 MM Misc Commonly known as: BD Pen Needle Nano U/F Use to give insulin  injects 4 times daily.   loratadine 10 MG tablet Commonly known as: CLARITIN Take 10 mg by mouth daily.   NovoLOG FlexPen 100 UNIT/ML FlexPen Generic drug: insulin aspart INJECT 10 TO 12 UNITS SUBCUTANEOUSLY THREE TIMES DAILY BEFORE MEAL(S)   OneTouch Delica Plus [id number] Misc Use to check blood sugar 4 times per day   OneTouch Verio Reflect w/Device Kit 1   OneTouch Verio test strip Generic drug: glucose blood Use as instructed to check blood sugar 4 times per day dx code E11.9   pantoprazole 40 MG tablet Commonly known as: PROTONIX Take 40 mg by mouth daily.   Polyethyl Glyc-Propyl Glyc PF 0.4-0.3 % Soln Apply 2 drops to eye 3 (three) times daily as needed (for dry eyes).   pregabalin 75 MG capsule Commonly known as: LYRICA TAKE 1 CAPSULE BY MOUTH THREE TIMES DAILY   simvastatin 20 MG tablet Commonly known as: ZOCOR Take 0.5 tablets (10 mg total) by mouth at bedtime.   tamsulosin 0.4 MG Caps capsule Commonly known as: FLOMAX Take 1 capsule (0.4 mg total) by mouth daily.   Tyler Aas FlexTouch 100 UNIT/ML FlexTouch Pen Generic drug: insulin degludec INJECT 40 UNITS SUBCUTANEOUSLY AT BEDTIME   Xigduo XR 10-998 MG Tb24 Generic drug: Dapagliflozin-metFORMIN HCl ER Take 1 tablet by mouth twice daily           Objective:   Physical Exam BP 128/62 (BP Location: Left Arm, Patient Position: Sitting, Cuff Size: Normal)   Pulse 71   Temp 98.2 F (36.8 C) (Oral)   Resp 18   Ht _0  (1.803 m)   Wt 223 lb 6 oz (101.3 kg)   SpO2 96%   BMI 31.15 kg/m  General:   Well developed, NAD, BMI noted. HEENT:  Normocephalic . Face symmetric, atraumatic Lungs:  CTA B Normal respiratory effort, no intercostal retractions, no accessory muscle use. Heart: RRR,  no murmur.  Lower extremities: +/+++ pretibial edema bilaterally, no warm to touch. Skin: Not pale. Not jaundice Neurologic:  alert & oriented X3.  Speech normal, gait appropriate for age and  unassisted Psych--  Cognition and judgment appear intact.  Cooperative with normal attention span and concentration.  Behavior appropriate. No anxious or depressed appearing.       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  Carotid US negative (01-2020)  PLAN Edema: Started shortly after he took olmesartan for 19  days, improved after the medication was discontinued. He did not have any lip swelling, cough or shortness of breath. Is hard to argue that the edema has to do with olmesartan given the timeframe. Plan: Stop olmesartan, monitor BPs, if the BPs  remain elevated consider reintroduce a  ARB, perhaps losartan with close symptom monitoring.  See AVS. HTN Started olmesartan 01/06/2021, Rx'd by Endo.  See above.  Check BMP Neuropathy: On Lyrica, contract signed RTC 02-2021 CPX.     This visit occurred during the SARS-CoV-2 public health emergency.  Safety protocols were in place, including screening questions prior to the visit, additional usage of staff PPE, and extensive cleaning of exam room while observing appropriate contact time as indicated for disinfecting solutions.

## 2021-01-26 NOTE — Patient Instructions (Addendum)
Stop olmesartan   Watch your salt intake  Check the  blood pressure 2 times a week BP GOAL is between 110/65 and  135/85. If it is consistently higher or lower, let me know      GO TO THE LAB : Get the blood work     GO TO THE FRONT DESK, PLEASE SCHEDULE YOUR APPOINTMENTS Come back for a physical exam by October

## 2021-01-27 LAB — BASIC METABOLIC PANEL
BUN: 14 mg/dL (ref 6–23)
CO2: 24 mEq/L (ref 19–32)
Calcium: 9 mg/dL (ref 8.4–10.5)
Chloride: 104 mEq/L (ref 96–112)
Creatinine, Ser: 1.02 mg/dL (ref 0.40–1.50)
GFR: 79.03 mL/min (ref >60.00)
Glucose, Bld: 160 mg/dL — ABNORMAL HIGH (ref 70–99)
Potassium: 4 mEq/L (ref 3.5–5.1)
Sodium: 140 mEq/L (ref 135–145)

## 2021-01-27 NOTE — Assessment & Plan Note (Signed)
Edema: Started shortly after he took olmesartan for 19 days, improved after the medication was discontinued. He did not have any lip swelling, cough or shortness of breath. Is hard to argue that the edema has to do with olmesartan given the timeframe. Plan: Stop olmesartan, monitor BPs, if the BPs  remain elevated consider reintroduce a  ARB, perhaps losartan with close symptom monitoring.  See AVS. HTN Started olmesartan 01/06/2021, Rx'd by Endo.  See above.  Check BMP Neuropathy: On Lyrica, contract signed RTC 02-2021 CPX.

## 2021-02-03 ENCOUNTER — Encounter: Payer: Self-pay | Admitting: Internal Medicine

## 2021-02-08 ENCOUNTER — Ambulatory Visit: Payer: BC Managed Care – PPO | Admitting: Gastroenterology

## 2021-02-12 ENCOUNTER — Other Ambulatory Visit: Payer: Self-pay | Admitting: Endocrinology

## 2021-02-24 ENCOUNTER — Ambulatory Visit: Payer: BC Managed Care – PPO | Admitting: Podiatry

## 2021-03-03 ENCOUNTER — Other Ambulatory Visit: Payer: Self-pay | Admitting: Endocrinology

## 2021-03-14 ENCOUNTER — Ambulatory Visit (INDEPENDENT_AMBULATORY_CARE_PROVIDER_SITE_OTHER): Payer: BC Managed Care – PPO

## 2021-03-14 ENCOUNTER — Ambulatory Visit: Payer: BC Managed Care – PPO | Admitting: Podiatry

## 2021-03-14 ENCOUNTER — Other Ambulatory Visit: Payer: Self-pay

## 2021-03-14 DIAGNOSIS — M7741 Metatarsalgia, right foot: Secondary | ICD-10-CM | POA: Diagnosis not present

## 2021-03-14 DIAGNOSIS — M778 Other enthesopathies, not elsewhere classified: Secondary | ICD-10-CM

## 2021-03-14 DIAGNOSIS — M7742 Metatarsalgia, left foot: Secondary | ICD-10-CM

## 2021-03-14 DIAGNOSIS — M722 Plantar fascial fibromatosis: Secondary | ICD-10-CM

## 2021-03-14 DIAGNOSIS — L909 Atrophic disorder of skin, unspecified: Secondary | ICD-10-CM

## 2021-03-14 DIAGNOSIS — M898X7 Other specified disorders of bone, ankle and foot: Secondary | ICD-10-CM

## 2021-03-14 MED ORDER — BETAMETHASONE SOD PHOS & ACET 6 (3-3) MG/ML IJ SUSP
9.0000 mg | Freq: Once | INTRAMUSCULAR | Status: AC
  Administered 2021-03-14: 9 mg

## 2021-03-14 NOTE — Progress Notes (Signed)
  Subjective:  Patient ID: Danny [name], male    DOB: 10/11/1960,  MRN: [id number]  Chief Complaint  Patient presents with   metatarsalgia    F/U Bl metatarsalgia -pt states, pain has not been any better since his last visit in Feb - pt has inserts with no help - most pain at BL lateral sides - 98/10 shapr constant pains- no swelling - w/ numbness at toes  Tx: tylenol     60 y.o. male presents with the above complaint. History confirmed with patient. States he noticed something develop on his right foot arch. Causing pain. Pain on the outsides of both feet, worse with activity and throughout the day.  Objective:  Physical Exam: warm, good capillary refill, no trophic changes or ulcerative lesions, normal DP and PT pulses and absent protective sensation. Fat pad atrophy forefoot bilat. POP 5th met head bilat. POP 5th met base bilat, more plantar than proximal base. 0.5cm diameter palpable firm nodule right plantar arch.  Assessment:   1. Metatarsalgia of both feet   2. Fat pad atrophy of foot   3. Exostosis of bone of foot   4. Capsulitis of foot, left   5. Capsulitis of foot, right   6. Plantar fibromatosis      Plan:  Patient was evaluated and treated and all questions answered.  Plantar fibroma right -Educated on etiology -Injection delivered as below  Procedure: Fibroma injection Location: right medial plantar fascia  Skin Prep: Alcohol. Injectate: 0.5 cc 0.5 cc marcaine plain, 0.5 betamethasone acetate-betamethasone sodium phosphate Disposition: Patient tolerated procedure well. Injection site dressed with a band-aid.  Capsulitis 5th met base, 5th met head -XR reviewed with patient -Given fat pad atrophy he may benefit from 5th met head excision. -Injection delivered to the 5th met bases plantarly. Discussed possible planing of bone should issues persist -Continue CMOS.  Procedure: Capsule injection Location: 5th met base capsule Skin Prep: Alcohol. Injectate:  0.5 cc 0.5 cc marcaine plain, 0.5 betamethasone acetate-betamethasone sodium phosphate Disposition: Patient tolerated procedure well. Injection site dressed with a band-aid.  Return in about 1 month (around 04/13/2021) for Capsulitis.

## 2021-03-27 ENCOUNTER — Other Ambulatory Visit: Payer: Self-pay | Admitting: Endocrinology

## 2021-03-29 ENCOUNTER — Other Ambulatory Visit: Payer: Self-pay

## 2021-03-29 ENCOUNTER — Ambulatory Visit (INDEPENDENT_AMBULATORY_CARE_PROVIDER_SITE_OTHER): Payer: BC Managed Care – PPO | Admitting: Internal Medicine

## 2021-03-29 ENCOUNTER — Encounter: Payer: Self-pay | Admitting: Internal Medicine

## 2021-03-29 VITALS — BP 138/58 | HR 68 | Temp 98.5°F | Resp 16 | Ht 71.0 in | Wt 226.0 lb

## 2021-03-29 DIAGNOSIS — Z0000 Encounter for general adult medical examination without abnormal findings: Secondary | ICD-10-CM | POA: Diagnosis not present

## 2021-03-29 DIAGNOSIS — Z23 Encounter for immunization: Secondary | ICD-10-CM

## 2021-03-29 DIAGNOSIS — E1142 Type 2 diabetes mellitus with diabetic polyneuropathy: Secondary | ICD-10-CM

## 2021-03-29 DIAGNOSIS — I10 Essential (primary) hypertension: Secondary | ICD-10-CM

## 2021-03-29 DIAGNOSIS — E785 Hyperlipidemia, unspecified: Secondary | ICD-10-CM

## 2021-03-29 NOTE — Patient Instructions (Signed)
Check the  blood pressure 2 or 3 times a month   BP GOAL is between 110/65 and  135/85. If it is consistently higher or lower, let me know    GO TO THE LAB : Get the blood work     GO TO THE FRONT DESK, PLEASE SCHEDULE YOUR APPOINTMENTS Come back for a physical exam in 1 year   "Living will", "Health Care Power of attorney": Advanced care planning  (If you already have a living will or healthcare power of attorney, please bring the copy to be scanned in your chart.)  Advance care planning is a process that supports adults in  understanding and sharing their preferences regarding future medical care.   The patient's preferences are recorded in documents called Advance Directives.    Advanced directives are completed (and can be modified at any time) while the patient is in full mental capacity.   The documentation should be available at all times to the patient, the family and the healthcare providers.  Bring in a copy to be scanned in your chart is an excellent idea and is recommended   This legal documents direct treatment decision making and/or appoint a surrogate to make the decision if the patient is not capable to do so.    Advance directives can be documented in many types of formats,  documents have names such as:  Lliving will  Durable power of attorney for healthcare (healthcare proxy or healthcare power of attorney)  Combined directives  Physician orders for life-sustaining treatment    More information at:  [email or web address]

## 2021-03-29 NOTE — Progress Notes (Signed)
Subjective:    Patient ID: Danny [name], male    DOB: 01-Jul-1960, 60 y.o.   MRN: [id number]  DOS:  03/29/2021 Type of visit - description: CPX  Since the last office visit is doing well. Had stomach problems, saw GI, Dx with H. pylori.  Doing better At the last visit, he had lower extremity edema.  That he is better.  BP Readings from Last 3 Encounters:  03/29/21 (!) 138/58  01/26/21 128/62  01/06/21 (!) 142/76    Review of Systems  Other than above, a 14 point review of systems is negative       Past Medical History:  Diagnosis Date   Allergic rhinitis    Benign prostatic hypertrophy    s/p TUNA procedure aprx 2006,   Carpal tunnel syndrome 06/17/2009   Diabetes mellitus    Type II dx 1999   DM type 2 with diabetic peripheral neuropathy ([place]) 10/15/2014   ED (erectile dysfunction)    After TUNA   Elevated LFTs 2010   (-) chronic hep  panel   History of cardiovascular stress test 8/11   Negative   Hyperlipidemia    HYPERSOMNIA UNSPECIFIED 12/11/2007   Hypertension    Obesity    PAF (paroxysmal atrial fibrillation) ([place]) 12/21/2009   a. Failed Multaq. b. Prev on flecainide. c. s/p afib ablation 2015.    Past Surgical History:  Procedure Laterality Date   ABLATION  03/24/2014   PVI by Dr Rayann Heman   ATRIAL FIBRILLATION ABLATION N/A 03/24/2014   Procedure: ATRIAL FIBRILLATION ABLATION;  Surgeon: Coralyn Mark, MD;  Location: [facility];  Service: Cardiovascular;  Laterality: N/A;   COLONOSCOPY  11/25/2012   Mild sigmoid diverticulosis, small internal hemorrhoids, otherwise normal colonoscopy   CYSTOSCOPY  04/26/2008   neg  @ urology, was rx Flomax   PROSTATE SURGERY     TUNA   TEE WITHOUT CARDIOVERSION N/A 03/23/2014   Procedure: TRANSESOPHAGEAL ECHOCARDIOGRAM (TEE);  Surgeon: Fay Records, MD;  Location: [facility] ENDOSCOPY;  Service: Cardiovascular;  Laterality: N/A;   VASECTOMY     several years ago     Social History   Socioeconomic History   Marital  status: Married    Spouse name: Not on file   Number of children: 3   Years of education: GED   Highest education level: Not on file  Occupational History   Occupation: Works for [employer] (state).  Drives a Energy manager: [employer]    Comment: works for [employer] ([employer])  Tobacco Use   Smoking status: Former    Types: Cigarettes    Quit date: 06/26/1985    Years since quitting: 35.7   Smokeless tobacco: Never  Vaping Use   Vaping Use: Never used  Substance and Sexual Activity   Alcohol use: No    Comment:     Drug use: No   Sexual activity: Not on file  Other Topics Concern   Not on file  Social History Narrative   Lives with wife in a one story   Right handed    One story home   3 children         Social Determinants of Health   Financial Resource Strain: Not on file  Food Insecurity: Not on file  Transportation Needs: Not on file  Physical Activity: Not on file  Stress: Not on file  Social Connections: Not on file  Intimate Partner Violence: Not on file    Allergies as  of 03/29/2021   No Known Allergies      Medication List        Accurate as of March 29, 2021 11:59 PM. If you have any questions, ask your nurse or doctor.          STOP taking these medications    Pylera 140-125-125 MG capsule Generic drug: bismuth-metronidazole-tetracycline Stopped by: [name], MD       TAKE these medications    acetaminophen 650 MG CR tablet Commonly known as: TYLENOL Take 650 mg by mouth every 8 (eight) hours as needed for pain.   azelastine 0.1 % nasal spray Commonly known as: ASTELIN Place 2 sprays into both nostrils 2 (two) times daily. Use in each nostril as directed   BD Veo Insulin Syringe U/F 31G X 15/64" 0.5 ML Misc Generic drug: Insulin Syringe-Needle U-100 USE 1 SYRINGE AS DIRECTED 3 TO 4 TIMES DAILY   Dexcom G6 Sensor Misc Use to monitor blood sugar, change after 10 days What changed: Another medication with the same name was removed.  Continue taking this medication, and follow the directions you see here. Changed by: [name], MD   Dexcom G6 Transmitter Misc See admin instructions.   dicyclomine 10 MG capsule Commonly known as: BENTYL Take 1 capsule by mouth 3 (three) times daily before meals.   Eliquis 5 MG Tabs tablet Generic drug: apixaban TAKE 1 TABLET(5 MG) BY MOUTH TWICE DAILY   famotidine 40 MG tablet Commonly known as: PEPCID Take 40 mg by mouth at bedtime.   fish oil-omega-3 fatty acids 1000 MG capsule Take 2 g by mouth 2 (two) times daily.   fluticasone 50 MCG/ACT nasal spray Commonly known as: FLONASE SHAKE LIQUID AND USE 2 SPRAYS IN EACH NOSTRIL DAILY AS NEEDED FOR ALLERGIES OR RHINITIS   FreeStyle Control Solution Liqd Use to calibrate freestyle libre   glucagon 1 MG injection Inject 1 mg into the vein once as needed.   Insulin Pen Needle 31G X 8 MM Misc Commonly known as: B-D ULTRAFINE III SHORT PEN Use one per day   Insulin Pen Needle 32G X 4 MM Misc Commonly known as: BD Pen Needle Nano U/F Use to give insulin injects 4 times daily.   loratadine 10 MG tablet Commonly known as: CLARITIN Take 10 mg by mouth daily.   NovoLOG FlexPen 100 UNIT/ML FlexPen Generic drug: insulin aspart INJECT 10 TO 12 UNITS SUBCUTANEOUSLY THREE TIMES DAILY BEFORE MEAL(S)   OneTouch Delica Plus [id number] Misc Use to check blood sugar 4 times per day   OneTouch Verio Reflect w/Device Kit 1   OneTouch Verio test strip Generic drug: glucose blood Use as instructed to check blood sugar 4 times per day dx code E11.9   pantoprazole 40 MG tablet Commonly known as: PROTONIX Take 40 mg by mouth daily.   Polyethyl Glyc-Propyl Glyc PF 0.4-0.3 % Soln Apply 2 drops to eye 3 (three) times daily as needed (for dry eyes).   pregabalin 75 MG capsule Commonly known as: LYRICA TAKE 1 CAPSULE BY MOUTH THREE TIMES DAILY   simvastatin 20 MG tablet Commonly known as: ZOCOR Take 0.5 tablets (10 mg total) by  mouth at bedtime.   tamsulosin 0.4 MG Caps capsule Commonly known as: FLOMAX Take 1 capsule (0.4 mg total) by mouth daily.   Tyler Aas FlexTouch 100 UNIT/ML FlexTouch Pen Generic drug: insulin degludec INJECT 42 UNITS SUBCUTANEOUSLY ONCE DAILY   Xigduo XR 10-998 MG Tb24 Generic drug: Dapagliflozin-metFORMIN HCl ER Take 1 tablet by  mouth twice daily           Objective:   Physical Exam BP (!) 138/58 (BP Location: Left Arm, Patient Position: Sitting, Cuff Size: Normal)   Pulse 68   Temp 98.5 F (36.9 C) (Oral)   Resp 16   Ht _0  (1.803 m)   Wt 226 lb (102.5 kg)   SpO2 96%   BMI 31.52 kg/m  General:   Well developed, NAD, BMI noted.  HEENT:  Normocephalic . Face symmetric, atraumatic Lungs:  CTA B Normal respiratory effort, no intercostal retractions, no accessory muscle use. Heart: RRR,  no murmur.  Abdomen:  Not distended, soft, non-tender. No rebound or rigidity.  Umbilical hernia and  diastases recti noted Skin: Not pale. Not jaundice Lower extremities: Trace pretibial edema bilaterally  Neurologic:  alert & oriented X3.  Speech normal, gait appropriate for age and unassisted Psych--  Cognition and judgment appear intact.  Cooperative with normal attention span and concentration.  Behavior appropriate. No anxious or depressed appearing.     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  Carotid US negative (01-2020)  PLAN Here for CPX DM: Per Endo Neuropathy: On Lyrica HTN: BP is okay today, lifestyle controlled, at home is only rarely more than 135/58.  Recommend to continue monitoring, if it is consistently higher let me know.   Hyperlipidemia: On simvastatin, lipid panel  today.  He is not fasting A. fib, last visit with the A. fib clinic 10/2020, noted to be NSR, was recommended to continue Eliquis. H. pylori: Patient saw Dr. Wynona Canes due to stomach problems, reportedly had a EGD, + H. pylori, had Pylera, feeling better. Edema: See last visit, essentially resolved. RTC 1 year     This visit occurred during the SARS-CoV-2 public health emergency.  Safety protocols were in place, including screening questions prior to the visit, additional usage of staff PPE, and extensive cleaning of exam room while observing appropriate contact time as indicated for disinfecting solutions.

## 2021-03-30 ENCOUNTER — Encounter: Payer: Self-pay | Admitting: Internal Medicine

## 2021-03-30 LAB — LIPID PANEL
Cholesterol: 114 mg/dL (ref 0–200)
HDL: 34.2 mg/dL — ABNORMAL LOW (ref >39.00)
NonHDL: 80.25
Total CHOL/HDL Ratio: 3
Triglycerides: 239 mg/dL — ABNORMAL HIGH (ref 0.0–149.0)
VLDL: 47.8 mg/dL — ABNORMAL HIGH (ref 0.0–40.0)

## 2021-03-30 LAB — ALT: ALT: 49 U/L (ref 0–53)

## 2021-03-30 LAB — AST: AST: 26 U/L (ref 0–37)

## 2021-03-30 LAB — LDL CHOLESTEROL, DIRECT: Direct LDL: 62 mg/dL

## 2021-03-30 LAB — TSH: TSH: 1.77 u[IU]/mL (ref 0.35–5.50)

## 2021-03-30 NOTE — Assessment & Plan Note (Signed)
Here for CPX DM: Per Endo Neuropathy: On Lyrica HTN: BP is okay today, lifestyle controlled, at home is only rarely more than 135/58.  Recommend to continue monitoring, if it is consistently higher let me know.   Hyperlipidemia: On simvastatin, lipid panel today.  He is not fasting A. fib, last visit with the A. fib clinic 10/2020, noted to be NSR, was recommended to continue Eliquis. H. pylori: Patient saw Dr. Windy Canny due to stomach problems, reportedly had a EGD, + H. pylori, had Pylera, feeling better. Edema: See last visit, essentially resolved. RTC 1 year

## 2021-03-30 NOTE — Assessment & Plan Note (Signed)
-  Tdap: Today - pnm shot 2015;  prevnar 07-2015 - s/p Shingrix x2 - s/p C-19 shots (JJ x2, recbooster) - flu shot: Today -CCS:  Cscope Dr Chales Abrahams in [place] : 2009 hyperplastic polyps.  Colonoscopy 11-2012  Colonoscopy 08/07/2017, no polyps, no colitis, Next 5 years (Dr. Fonnie Mu) -Prostate cancer screening, DRE and PSA normal 03/12/2020 -Labs: Lipid panel, LFTs, TSH -Diet-exercise - discussed  -Healthcare power of attorney discussed

## 2021-03-31 ENCOUNTER — Other Ambulatory Visit: Payer: Self-pay | Admitting: Internal Medicine

## 2021-03-31 NOTE — Telephone Encounter (Signed)
Refill request for simvastatin 20mg  1/2 tab qhs- labs completed on 03/29/21- okay to refill?

## 2021-03-31 NOTE — Telephone Encounter (Signed)
Labs okay, okay to refill for a year

## 2021-04-10 ENCOUNTER — Other Ambulatory Visit: Payer: Self-pay | Admitting: Internal Medicine

## 2021-04-14 ENCOUNTER — Ambulatory Visit: Payer: BC Managed Care – PPO | Admitting: Podiatry

## 2021-04-14 ENCOUNTER — Encounter: Payer: Self-pay | Admitting: Podiatry

## 2021-04-14 ENCOUNTER — Other Ambulatory Visit: Payer: Self-pay

## 2021-04-14 DIAGNOSIS — M722 Plantar fascial fibromatosis: Secondary | ICD-10-CM | POA: Diagnosis not present

## 2021-04-14 DIAGNOSIS — M778 Other enthesopathies, not elsewhere classified: Secondary | ICD-10-CM

## 2021-04-21 NOTE — Progress Notes (Signed)
  Subjective:  Patient ID: Danny [name], male    DOB: [birth year],  MRN: [id number]  Chief Complaint  Patient presents with   Foot Pain    The shots did help on both feet and I am a diabetic    60 y.o. male presents with the above complaint. History confirmed with patient. States the injection helped, the mass appears smaller.   Objective:  Physical Exam: warm, good capillary refill, no trophic changes or ulcerative lesions, normal DP and PT pulses and absent protective sensation. Fat pad atrophy forefoot bilat. POP 5th met head bilat. POP 5th met base bilat, more plantar than proximal base. 0.3cm diameter palpable firm nodule right plantar arch.  Assessment:   1. Capsulitis of foot, left   2. Plantar fibromatosis    Plan:  Patient was evaluated and treated and all questions answered.  Plantar fibroma right, Capsulitis 5th met base, 5th met head -Repeat injection to the right plantar fascia and 5th met base  Procedure: Fibroma injection Location: right medial plantar fascia  Skin Prep: Alcohol. Injectate: 0.5 cc 1% lidocaine plain, 0.5 betamethasone acetate-betamethasone sodium phosphate Disposition: Patient tolerated procedure well. Injection site dressed with a band-aid.  Procedure: Tendon Injection Location: Right 5th met base Skin Prep: Alcohol. Injectate: 0.5 cc 1% lidocaine plain, 0.5 cc betamethasone acetate-betamethasone sodium phosphate Disposition: Patient tolerated procedure well. Injection site dressed with a band-aid.  Return if symptoms worsen or fail to improve.

## 2021-05-10 ENCOUNTER — Other Ambulatory Visit: Payer: Self-pay

## 2021-05-10 ENCOUNTER — Encounter: Payer: Self-pay | Admitting: Endocrinology

## 2021-05-10 ENCOUNTER — Ambulatory Visit: Payer: BC Managed Care – PPO | Admitting: Endocrinology

## 2021-05-10 VITALS — BP 140/80 | HR 67 | Ht 71.0 in | Wt 224.0 lb

## 2021-05-10 DIAGNOSIS — E1165 Type 2 diabetes mellitus with hyperglycemia: Secondary | ICD-10-CM

## 2021-05-10 DIAGNOSIS — Z794 Long term (current) use of insulin: Secondary | ICD-10-CM | POA: Diagnosis not present

## 2021-05-10 DIAGNOSIS — E782 Mixed hyperlipidemia: Secondary | ICD-10-CM | POA: Diagnosis not present

## 2021-05-10 DIAGNOSIS — E1142 Type 2 diabetes mellitus with diabetic polyneuropathy: Secondary | ICD-10-CM | POA: Diagnosis not present

## 2021-05-10 LAB — POCT GLYCOSYLATED HEMOGLOBIN (HGB A1C): Hemoglobin A1C: 6.8 % — AB (ref 4.0–5.6)

## 2021-05-10 NOTE — Patient Instructions (Addendum)
Tresiba 52  Cover cookies with 4-5 units

## 2021-05-10 NOTE — Progress Notes (Signed)
Patient ID: Danny [name], male   DOB: 09-Dec-1960, 60 y.o.   MRN: [id number]    Reason for Appointment: Followup for Type 2 Diabetes  Referring physician: Larose Kells  History of Present Illness:          Diagnosis: Type 2 diabetes mellitus, date of diagnosis: 1992        Past history:  He has had long-standing diabetes and previously was treated with metformin, Amaryl, Januvia and Actos He was taken off metformin because of fear of liver problems.  May have been taken taken off Actos because of swelling of his legs but was on 45 mg at that time Over the last 2 years at least his A1c has been consistently over 8% He was started on insulin in 2011 probably when his A1c was over 9% Initially was given Lantus and Humalog and subsequently switched to 70/30 twice a day for unknown reasons Has not taken Byetta or Victoza in the past He had been on 70/30 insulin initially for a few years Previously A1c was 8% or more consistently before starting Invokamet. He was switched from Lantus to [place] in 1/17  Recent history:   INSULIN regimen is described as:  Tresiba 50 units at 9 pm Novolog 0-10 at breakfast, 8-12 units before lunch, 10-15 ac supper  Oral hypoglycemic drugs the patient is taking are: Xigduo, 10/998, 1 tablet twice a day  His A1c has been usually in the upper normal range, now 6.8     Current management, blood sugar patterns and problems identified: He is now using the Dexcom sensor, had technical difficulties with the freestyle and also he thinks his Dexcom is more accurate compared to fingersticks He however thinks that his sugars may be higher because of intermittent intra-articular steroids, recovering from COVID infection  As before he is not always consistently taking insulin especially at breakfast for mealtime coverage because of fear of low sugars  Takes coverage for dinner based on his meal size However he is more recently taking insulin to cover his lunch  meal  Generally blood sugars are fairly good with 76% within the target range and no significant low sugar  However blood sugars may be higher after meals at times Also at bedtime he will have some cookies without coverage and blood sugars may be periodically over 200 late at night  Continuing to take Xigduo consistently as before in the evening When he had higher sugars after COVID infection he went to 5 units on his [place] However his fasting readings are still mildly increased overall and usually not less than 130        Side effects from medications have been: None  Compliance with the medical regimen: Good  Glucose monitoring:  done 3-4 times a day         Glucometer: One Touch Verio.       Blood Glucose patterns:  HI his blood sugars are late at night or after breakfast but has variable readings at all times  Has some variability in his blood sugars after lunch but on an average blood sugars are not spiking as much after lunch compared to breakfast and bedtime  OVERNIGHT readings are mostly high starting late at night and improving by 4 AM without hypoglycemia  As above postprandial readings are somewhat variable after breakfast and lunch but has indicated below his blood sugars do not rise excessively except sometimes after breakfast especially in the first week of his data  Bedtime readings  may be relatively higher after 9-10 PM at times   CGM use % of time 93  2-week average/GV   Time in range    76    %  % Time Above 180   % Time above 250   % Time Below 70      PRE-MEAL Fasting Lunch Dinner Bedtime Overall  Glucose range:       Averages: 149 151 146     POST-MEAL PC Breakfast PC Lunch PC Dinner  Glucose range:     Averages: 175 160 167    Previously:  CGM use % of time 89  2-week average/GV 142  Time in range        85%  % Time Above 180 14  % Time above 250   % Time Below 70 1     PRE-MEAL Fasting Lunch Dinner Bedtime Overall  Glucose range:        Averages: 141 132 154  142   POST-MEAL PC Breakfast PC Lunch PC Dinner  Glucose range:     Averages: 160 151 155       Glycemic control:   Lab Results  Component Value Date   HGBA1C 6.8 (A) 05/10/2021   HGBA1C 6.7 (H) 12/31/2020   HGBA1C 6.4 08/25/2020   Lab Results  Component Value Date   MICROALBUR <0.7 08/25/2020   LDLCALC 40 04/26/2020   CREATININE 1.02 01/26/2021    Self-care:  Meals: 3 meals per day. Breakfast is eggs or cereal usually,  controlling portions      Dietician visit: Most recent: years ago.                 Weight history:  Wt Readings from Last 3 Encounters:  05/10/21 224 lb (101.6 kg)  03/29/21 226 lb (102.5 kg)  01/26/21 223 lb 6 oz (101.3 kg)    Office Visit on 05/10/2021  Component Date Value Ref Range Status   Hemoglobin A1C 05/10/2021 6.8 (A)  4.0 - 5.6 % Final    Allergies as of 05/10/2021   No Known Allergies      Medication List        Accurate as of May 10, 2021  1:10 PM. If you have any questions, ask your nurse or doctor.          acetaminophen 650 MG CR tablet Commonly known as: TYLENOL Take 650 mg by mouth every 8 (eight) hours as needed for pain.   azelastine 0.1 % nasal spray Commonly known as: ASTELIN Place 2 sprays into both nostrils 2 (two) times daily. Use in each nostril as directed   BD Veo Insulin Syringe U/F 31G X 15/64" 0.5 ML Misc Generic drug: Insulin Syringe-Needle U-100 USE 1 SYRINGE AS DIRECTED 3 TO 4 TIMES DAILY   BinaxNOW COVID-19 Ag Home Test Kit Generic drug: COVID-19 At Home Antigen Test Use as Directed on the Package   Dexcom G6 Sensor Misc Use to monitor blood sugar, change after 10 days   Dexcom G6 Transmitter Misc See admin instructions.   dicyclomine 10 MG capsule Commonly known as: BENTYL Take 1 capsule by mouth 3 (three) times daily before meals.   Eliquis 5 MG Tabs tablet Generic drug: apixaban TAKE 1 TABLET(5 MG) BY MOUTH TWICE DAILY   famotidine 40 MG  tablet Commonly known as: PEPCID Take 40 mg by mouth at bedtime.   fish oil-omega-3 fatty acids 1000 MG capsule Take 2 g by mouth 2 (two) times daily.   fluticasone 50  MCG/ACT nasal spray Commonly known as: FLONASE SHAKE LIQUID AND USE 2 SPRAYS IN EACH NOSTRIL DAILY AS NEEDED FOR ALLERGIES OR RHINITIS   FreeStyle Control Solution Liqd Use to calibrate freestyle libre   glucagon 1 MG injection Inject 1 mg into the vein once as needed.   Insulin Pen Needle 31G X 8 MM Misc Commonly known as: B-D ULTRAFINE III SHORT PEN Use one per day   Insulin Pen Needle 32G X 4 MM Misc Commonly known as: BD Pen Needle Nano U/F Use to give insulin injects 4 times daily.   loratadine 10 MG tablet Commonly known as: CLARITIN Take 10 mg by mouth daily.   NovoLOG FlexPen 100 UNIT/ML FlexPen Generic drug: insulin aspart INJECT 10 TO 12 UNITS SUBCUTANEOUSLY THREE TIMES DAILY BEFORE MEAL(S)   OneTouch Delica Plus [id number] Misc Use to check blood sugar 4 times per day   OneTouch Verio Reflect w/Device Kit 1   OneTouch Verio test strip Generic drug: glucose blood Use as instructed to check blood sugar 4 times per day dx code E11.9   pantoprazole 40 MG tablet Commonly known as: PROTONIX Take 40 mg by mouth daily.   Polyethyl Glyc-Propyl Glyc PF 0.4-0.3 % Soln Apply 2 drops to eye 3 (three) times daily as needed (for dry eyes).   pregabalin 75 MG capsule Commonly known as: LYRICA TAKE 1 CAPSULE BY MOUTH THREE TIMES DAILY   simvastatin 20 MG tablet Commonly known as: ZOCOR TAKE 1/2 TABLET(10 MG) BY MOUTH AT BEDTIME   tamsulosin 0.4 MG Caps capsule Commonly known as: FLOMAX Take 1 capsule (0.4 mg total) by mouth daily.   Tyler Aas FlexTouch 100 UNIT/ML FlexTouch Pen Generic drug: insulin degludec INJECT 42 UNITS SUBCUTANEOUSLY ONCE DAILY   Xigduo XR 10-998 MG Tb24 Generic drug: Dapagliflozin-metFORMIN HCl ER Take 1 tablet by mouth twice daily        Allergies:  No Known  Allergies  Past Medical History:  Diagnosis Date   Allergic rhinitis    Benign prostatic hypertrophy    s/p TUNA procedure aprx 2006,   Carpal tunnel syndrome 06/17/2009   Diabetes mellitus    Type II dx 1999   DM type 2 with diabetic peripheral neuropathy ([place]) 10/15/2014   ED (erectile dysfunction)    After TUNA   Elevated LFTs 2010   (-) chronic hep  panel   History of cardiovascular stress test 8/11   Negative   Hyperlipidemia    HYPERSOMNIA UNSPECIFIED 12/11/2007   Hypertension    Obesity    PAF (paroxysmal atrial fibrillation) ([place]) 12/21/2009   a. Failed Multaq. b. Prev on flecainide. c. s/p afib ablation 2015.    Past Surgical History:  Procedure Laterality Date   ABLATION  03/24/2014   PVI by Dr Rayann Heman   ATRIAL FIBRILLATION ABLATION N/A 03/24/2014   Procedure: ATRIAL FIBRILLATION ABLATION;  Surgeon: Coralyn Mark, MD;  Location: [facility] CATH LAB;  Service: Cardiovascular;  Laterality: N/A;   COLONOSCOPY  11/25/2012   Mild sigmoid diverticulosis, small internal hemorrhoids, otherwise normal colonoscopy   CYSTOSCOPY  04/26/2008   neg  @ urology, was rx Flomax   PROSTATE SURGERY     TUNA   TEE WITHOUT CARDIOVERSION N/A 03/23/2014   Procedure: TRANSESOPHAGEAL ECHOCARDIOGRAM (TEE);  Surgeon: Fay Records, MD;  Location: [facility] ENDOSCOPY;  Service: Cardiovascular;  Laterality: N/A;   VASECTOMY     several years ago    Family History  Problem Relation Age of Onset   Heart disease Mother  Diabetes Mother    Hypertension Mother    Congestive Heart Failure Mother    Heart attack Mother    Hypertension Father    Colon cancer Neg Hx    Prostate cancer Neg Hx     Social History:  reports that he quit smoking about 35 years ago. His smoking use included cigarettes. He has never used smokeless tobacco. He reports that he does not drink alcohol and does not use drugs.    Review of Systems       Lipids: He has been on low dose simvastatin long-term, Followed by PCP Has  good control except now has high triglycerides and low HDL On bid fish oil  Labs as below:      Lab Results  Component Value Date   CHOL 114 03/29/2021   HDL 34.20 (L) 03/29/2021   LDLCALC 40 04/26/2020   LDLDIRECT 62.0 03/29/2021   TRIG 239.0 (H) 03/29/2021   CHOLHDL 3 03/29/2021               No history of hypertension Occasionally monitoring at home and he thinks his systolic reading is around 140/75 His PCP has not wanted to start him on any medications Blood pressure was better on the second measurement  BP Readings from Last 3 Encounters:  05/10/21 140/80  03/29/21 (!) 138/58  01/26/21 128/62      Neuropathy: Has longstanding history of Numbness, pins-and-needle sensation and stinging in feet and lower legs He has been followed by neurologist, taking 75 mg, 3 daily of Lyrica Symptoms are mostly controlled  No sensory loss on exam     LABS:  Office Visit on 05/10/2021  Component Date Value Ref Range Status   Hemoglobin A1C 05/10/2021 6.8 (A)  4.0 - 5.6 % Final    Physical Examination:  BP 140/80   Pulse 67   Ht '5\' 11"'  (1.803 m)   Wt 224 lb (101.6 kg)   SpO2 96%   BMI 31.24 kg/m    ASSESSMENT/PLAN:   No ankle edema   Diabetes type 2 on insulin  See history of present illness for detailed discussion of current diabetes management, blood sugar patterns and problems identified  His A1c is still fairly good although slightly higher at 6.8  He is on basal bolus insulin with Xigduo  His GMI recently on his Dexcom was 7% and he has average readings between about 135-170 at any given time of the day but also over 75% within target range He is using the Dexcom with better accuracy Postprandial hyperglycemia may be present occasionally after breakfast when he does not take any coverage and also occasionally at lunch, dinner or bedtime snack as discussed above He did start coverage for lunch as recommended on the last visit  He is fairly active in the  mornings but has not had any low sugars Also benefiting from [place]  For now since his morning sugars are averaging about 150 he will go up at least 2 units on his [place] Aas Also needs to take some coverage for breakfast if he is eating a larger meal or Premeal blood sugar is relatively high   Lipids: Likely needs better control with high triglycerides and should take 20 mg of Zocor   Borderline hypertension: Blood pressure is upper normal and may potentially benefit from losartan, will defer to PCP  Follow-up in 4 months  Patient Instructions  Tresiba 52  Cover cookies with 4-5 units      Elayne Snare 05/10/2021,  1:10 PM   Note: This office note was prepared with Estate agent. Any transcriptional errors that result from this process are unintentional. .

## 2021-05-12 MED ORDER — SIMVASTATIN 20 MG PO TABS: 20.0000 mg | ORAL_TABLET | Freq: Every day | ORAL | 3 refills | Status: DC

## 2021-05-14 ENCOUNTER — Other Ambulatory Visit: Payer: Self-pay | Admitting: Endocrinology

## 2021-05-20 ENCOUNTER — Telehealth: Payer: Self-pay | Admitting: Internal Medicine

## 2021-05-23 NOTE — Telephone Encounter (Signed)
Requesting: Lyrica 75mg  Contract: 01/26/2021 UDS: None Last Visit: 03/29/2021 Next Visit: 03/30/2022 Last Refill: 11/23/2020 #90 and 5RF  Please Advise

## 2021-05-23 NOTE — Telephone Encounter (Signed)
PDMP okay, Rx sent 

## 2021-06-05 ENCOUNTER — Other Ambulatory Visit: Payer: Self-pay | Admitting: Endocrinology

## 2021-06-07 ENCOUNTER — Other Ambulatory Visit: Payer: Self-pay | Admitting: Endocrinology

## 2021-06-08 ENCOUNTER — Telehealth: Payer: Self-pay | Admitting: Gastroenterology

## 2021-06-08 NOTE — Telephone Encounter (Signed)
Hey Dr. Chales Abrahams,   We received a referral in our office for patient. He is experiencing changes in bowel habits, diarrhea, constipation, bloating, and possible colonoscopy. He was previously seen by GI 12/2020 and last colonoscopy in 2019. I have records and will send to you for review. Could you please review and advise on scheduling?  Thank you

## 2021-06-09 NOTE — Telephone Encounter (Signed)
No problems First available follow-up appointment (Jan) Please have records available at clinic visit RG

## 2021-06-13 ENCOUNTER — Encounter: Payer: Self-pay | Admitting: Gastroenterology

## 2021-06-13 NOTE — Telephone Encounter (Signed)
Scheduled OV 12/29

## 2021-06-20 ENCOUNTER — Other Ambulatory Visit: Payer: Self-pay | Admitting: Internal Medicine

## 2021-06-22 ENCOUNTER — Other Ambulatory Visit: Payer: Self-pay | Admitting: Internal Medicine

## 2021-06-23 ENCOUNTER — Ambulatory Visit: Payer: BC Managed Care – PPO | Admitting: Gastroenterology

## 2021-06-23 ENCOUNTER — Ambulatory Visit (INDEPENDENT_AMBULATORY_CARE_PROVIDER_SITE_OTHER): Payer: BC Managed Care – PPO | Admitting: Gastroenterology

## 2021-06-23 ENCOUNTER — Other Ambulatory Visit (INDEPENDENT_AMBULATORY_CARE_PROVIDER_SITE_OTHER): Payer: BC Managed Care – PPO

## 2021-06-23 ENCOUNTER — Other Ambulatory Visit: Payer: Self-pay

## 2021-06-23 VITALS — BP 138/70 | HR 73 | Ht 71.0 in | Wt 228.0 lb

## 2021-06-23 DIAGNOSIS — R1030 Lower abdominal pain, unspecified: Secondary | ICD-10-CM | POA: Diagnosis not present

## 2021-06-23 DIAGNOSIS — R140 Abdominal distension (gaseous): Secondary | ICD-10-CM | POA: Diagnosis not present

## 2021-06-23 DIAGNOSIS — K582 Mixed irritable bowel syndrome: Secondary | ICD-10-CM

## 2021-06-23 LAB — CBC WITH DIFFERENTIAL/PLATELET
Basophils Absolute: 0 10*3/uL (ref 0.0–0.1)
Basophils Relative: 0.6 % (ref 0.0–3.0)
Eosinophils Absolute: 0.2 10*3/uL (ref 0.0–0.7)
Eosinophils Relative: 2.5 % (ref 0.0–5.0)
HCT: 47.7 % (ref 39.0–52.0)
Hemoglobin: 15.5 g/dL (ref 13.0–17.0)
Lymphocytes Relative: 17.9 % (ref 12.0–46.0)
Lymphs Abs: 1.4 10*3/uL (ref 0.7–4.0)
MCHC: 32.6 g/dL (ref 30.0–36.0)
MCV: 83.2 fl (ref 78.0–100.0)
Monocytes Absolute: 0.7 10*3/uL (ref 0.1–1.0)
Monocytes Relative: 9 % (ref 3.0–12.0)
Neutro Abs: 5.4 10*3/uL (ref 1.4–7.7)
Neutrophils Relative %: 70 % (ref 43.0–77.0)
Platelets: 245 10*3/uL (ref 150.0–400.0)
RBC: 5.73 Mil/uL (ref 4.22–5.81)
RDW: 14.5 % (ref 11.5–15.5)
WBC: 7.7 10*3/uL (ref 4.0–10.5)

## 2021-06-23 LAB — COMPREHENSIVE METABOLIC PANEL
ALT: 52 U/L (ref 0–53)
AST: 26 U/L (ref 0–37)
Albumin: 4 g/dL (ref 3.5–5.2)
Alkaline Phosphatase: 70 U/L (ref 39–117)
BUN: 15 mg/dL (ref 6–23)
CO2: 32 mEq/L (ref 19–32)
Calcium: 9.3 mg/dL (ref 8.4–10.5)
Chloride: 103 mEq/L (ref 96–112)
Creatinine, Ser: 1.07 mg/dL (ref 0.40–1.50)
GFR: 74.41 mL/min (ref >60.00)
Glucose, Bld: 84 mg/dL (ref 70–99)
Potassium: 4.4 mEq/L (ref 3.5–5.1)
Sodium: 142 mEq/L (ref 135–145)
Total Bilirubin: 0.5 mg/dL (ref 0.2–1.2)
Total Protein: 6.6 g/dL (ref 6.0–8.3)

## 2021-06-23 MED ORDER — DICYCLOMINE HCL 10 MG PO CAPS: 10.0000 mg | ORAL_CAPSULE | Freq: Three times a day (TID) | ORAL | 11 refills | Status: DC

## 2021-06-23 NOTE — Progress Notes (Signed)
Chief Complaint: GI problems  Referring Provider:  Colon Branch, MD      ASSESSMENT AND PLAN;   #1. Lower abdo pain with bloating  #2. IBS- D. Neg colon with Bx Feb 2019  Plan: -Bentyl 37m po QID #120, 11 RF.  Take half an hour before meals and bedtime. -Stop excessive fiber -Hold off on all supplements x 4 weeks including fish oil -CT AP with PO and IV contrast. -Lactose free diet -CBC, CMP, celiac,  -FU in 12 weeks. If still with problems, consider GES followed by SIBO breath test   HPI:    Danny COSBYis a 60y.o. male  A Fib on Eliquis, HTN, DM2, BPH, HLD, obesity, IBS-D  With lower abdo pain x 5-6 yrs, assoc bloating which gets exacerbated after eating.  Has been having diarrhea alternating with constipation.  More so diarrhea.  The abdominal pain has gotten worse over the last month and now occurs every day.  This would occasionally wake him up from sleep.  No nocturnal diarrhea.  No melena or hematochezia.   [facility]  Per notes, they have been planning to do CT Abdo/pelvis.  He does give history suggestive of lactose intolerance.  No history suggestive of gluten intolerance.  No jaundice dark urine or pale stools.  No weight loss.  No fever chills or night sweats.  No sodas, chocolates, chewing gums, artificial sweeteners and candy. No NSAIDs.  Bentyl helps previously but he ran out of medicines.  Labs from earlier this year revealed normal CBC, CMP, TSH.   Previous GI work-up: [facility] irregular Z-line. -S/p biopsies distal esophagus and gastric.  We do not have biopsy results. -Report sent for scanning.   [facility]-history of polyps, left lower quadrant abdominal pain, diarrhea) -Second-degree hemorrhoids -Otherwise normal colonoscopy -Negative random colon  biopsies for microscopic colitis. -Repeat in 5 years -Report sent for scanning.  Past Medical History:  Diagnosis Date   Allergic rhinitis    Benign prostatic hypertrophy    s/p TUNA procedure aprx 2006,   Carpal tunnel syndrome 06/17/2009   Diabetes mellitus    Type II dx 1999   [place]   ED (erectile dysfunction)    After TUNA   Elevated LFTs 2010   (-) chronic hep  panel   History of cardiovascular stress test 8/11   Negative   Hyperlipidemia    HYPERSOMNIA UNSPECIFIED 12/11/2007   Hypertension    Obesity    [place]   a. Failed Multaq. b. Prev on flecainide. c. s/p afib ablation 2015.    Past Surgical History:  Procedure Laterality Date   ABLATION  03/24/2014   PVI by Dr ARayann Heman  ATRIAL FIBRILLATION ABLATION N/A 03/24/2014   Procedure: ATRIAL FIBRILLATION ABLATION;  Surgeon: JCoralyn Mark MD;  [facility];  Service: Cardiovascular;  Laterality: N/A;   COLONOSCOPY  11/25/2012   Mild sigmoid diverticulosis, small internal hemorrhoids, otherwise normal colonoscopy   CYSTOSCOPY  04/26/2008   neg  @ urology, was rx Flomax   PROSTATE SURGERY     TUNA   TEE WITHOUT CARDIOVERSION N/A 03/23/2014   Procedure: TRANSESOPHAGEAL ECHOCARDIOGRAM (TEE);  Surgeon: PFay Records MD;  [place] [facility]  Service: Cardiovascular;  Laterality: N/A;  VASECTOMY     several years ago    Family History  Problem Relation Age of Onset   Heart disease Mother    Diabetes Mother    Hypertension Mother    Congestive Heart Failure Mother    Heart attack Mother    Hypertension Father    Colon cancer Neg Hx    Prostate cancer Neg Hx     Social History   Tobacco Use   Smoking status: Former    Types: Cigarettes    Quit date: 06/26/1985    Years since quitting: 36.0   Smokeless tobacco: Never  Vaping Use   Vaping Use: Never used  Substance Use Topics   Alcohol use: No     Comment:     Drug use: No    Current Outpatient Medications  Medication Sig Dispense Refill   acetaminophen (TYLENOL) 650 MG CR tablet Take 650 mg by mouth every 8 (eight) hours as needed for pain.     azelastine (ASTELIN) 0.1 % nasal spray Place 2 sprays into both nostrils 2 (two) times daily. Use in each nostril as directed 30 mL 5   BD VEO INSULIN SYRINGE U/F 31G X 15/64" 0.5 ML MISC USE 1 SYRINGE AS DIRECTED 3 TO 4 TIMES DAILY 100 each 0   BINAXNOW COVID-19 AG HOME TEST KIT Use as Directed on the Package     Blood Glucose Calibration (FREESTYLE CONTROL SOLUTION) LIQD Use to calibrate freestyle libre 1 each 3   Blood Glucose Monitoring Suppl (ONETOUCH VERIO REFLECT) w/Device KIT 1 1 kit 0   Continuous Blood Gluc Sensor (DEXCOM G6 SENSOR) MISC USE AS DIRECTED TO  MONITOR  BLOOD  SUGAR,  CHANGE  EVERY  10  DAYS 3 each 0   Continuous Blood Gluc Transmit (DEXCOM G6 TRANSMITTER) MISC See admin instructions.     ELIQUIS 5 MG TABS tablet TAKE 1 TABLET(5 MG) BY MOUTH TWICE DAILY 60 tablet 6   fish oil-omega-3 fatty acids 1000 MG capsule Take 2 g by mouth 2 (two) times daily.     fluticasone (FLONASE) 50 MCG/ACT nasal spray SHAKE LIQUID AND USE 2 SPRAYS IN EACH NOSTRIL DAILY AS NEEDED FOR ALLERGIES OR RHINITIS 16 g 5   glucagon (GLUCAGON EMERGENCY) 1 MG injection Inject 1 mg into the vein once as needed. 1 each 5   glucose blood (ONETOUCH VERIO) test strip Use as instructed to check blood sugar 4 times per day dx code E11.9 125 each 3   Insulin Pen Needle (B-D ULTRAFINE III SHORT PEN) 31G X 8 MM MISC Use one per day 30 each 3   Insulin Pen Needle (BD PEN NEEDLE NANO U/F) 32G X 4 MM MISC Use to give insulin injects 4 times daily. 150 each 3   Lancets (ONETOUCH DELICA PLUS [id number]) MISC Use to check blood sugar 4 times per day 200 each 2   loratadine (CLARITIN) 10 MG tablet Take 10 mg by mouth daily.     NOVOLOG FLEXPEN 100 UNIT/ML FlexPen INJECT 10 TO 12 UNITS SUBCUTANEOUSLY THREE TIMES DAILY  BEFORE MEAL(S) 15 mL 0   Polyethyl Glyc-Propyl Glyc PF 0.4-0.3 % SOLN Apply 2 drops to eye 3 (three) times daily as needed (for dry eyes).     pregabalin (LYRICA) 75 MG capsule TAKE 1 CAPSULE BY MOUTH THREE TIMES DAILY 90 capsule 5   simvastatin (ZOCOR) 20 MG tablet Take 1 tablet (20 mg total) by mouth at bedtime. 90 tablet 3   tamsulosin (FLOMAX) 0.4 MG CAPS  capsule Take 1 capsule (0.4 mg total) by mouth daily. 90 capsule 3   TRESIBA FLEXTOUCH 100 UNIT/ML FlexTouch Pen INJECT 42 UNITS SUBCUTANEOUSLY ONCE DAILY 15 mL 0   XIGDUO XR 10-998 MG TB24 Take 1 tablet by mouth twice daily 180 tablet 0   No current facility-administered medications for this visit.    No Known Allergies  Review of Systems:  Constitutional: Denies fever, chills, diaphoresis, appetite change and fatigue.  HEENT: Denies photophobia, eye pain, redness, hearing loss, ear pain, congestion, sore throat, rhinorrhea, sneezing, mouth sores, neck pain, neck stiffness and tinnitus.   Respiratory: Denies SOB, DOE, cough, chest tightness,  and wheezing.   Cardiovascular: Denies chest pain, palpitations and leg swelling.  Genitourinary: Denies dysuria, urgency, frequency, hematuria, flank pain and difficulty urinating.  Musculoskeletal: Has myalgias, back pain, joint swelling, arthralgias and gait problem.  Skin: No rash.  Neurological: Denies dizziness, seizures, syncope, weakness, light-headedness, numbness and headaches.  Hematological: Denies adenopathy. Easy bruising, personal or family bleeding history  Psychiatric/Behavioral: No anxiety or depression.  Has sleeping problems.     Physical Exam:    BP 138/70    Pulse 73    Ht _0  (1.803 m)    Wt 228 lb (103.4 kg)    SpO2 96%    BMI 31.80 kg/m  Wt Readings from Last 3 Encounters:  06/23/21 228 lb (103.4 kg)  05/10/21 224 lb (101.6 kg)  03/29/21 226 lb (102.5 kg)   Constitutional:  Well-developed, in no acute distress. Psychiatric: Normal mood and affect. Behavior  is normal. HEENT: Pupils normal.  Conjunctivae are normal. No scleral icterus. Neck supple.  Cardiovascular: Normal rate, regular rhythm. No edema Pulmonary/chest: Effort normal and breath sounds normal. No wheezing, rales or rhonchi. Abdominal: Soft, nondistended. Nontender. Bowel sounds active throughout. There are no masses palpable. No hepatomegaly.  Small umbilical hernia.  Divarication of recti. Rectal: Deferred Neurological: Alert and oriented to person place and time. Skin: Skin is warm and dry. No rashes noted.  Data Reviewed: I have personally reviewed following labs and imaging studies  CBC: CBC Latest Ref Rng & Units 11/16/2020 01/05/2020 03/11/2019  WBC 4.0 - 10.5 K/uL 5.4 9.6 6.9  Hemoglobin 13.0 - 17.0 g/dL 15.9 16.2 16.3  Hematocrit 39.0 - 52.0 % 50.8 48.8 49.2  Platelets 150 - 400 K/uL 247 251.0 252.0    CMP: CMP Latest Ref Rng & Units 03/29/2021 01/26/2021 12/31/2020  Glucose 70 - 99 mg/dL - 160(H) 161(H)  BUN 6 - 23 mg/dL - 14 18  Creatinine 0.40 - 1.50 mg/dL - 1.02 1.02  Sodium 135 - 145 mEq/L - 140 140  Potassium 3.5 - 5.1 mEq/L - 4.0 4.4  Chloride 96 - 112 mEq/L - 104 103  CO2 19 - 32 mEq/L - 24 28  Calcium 8.4 - 10.5 mg/dL - 9.0 9.1  Total Protein 6.0 - 8.3 g/dL - - -  Total Bilirubin 0.2 - 1.2 mg/dL - - -  Alkaline Phos 39 - 117 U/L - - -  AST 0 - 37 U/L 26 - -  ALT 0 - 53 U/L 49 - -      Carmell Austria, MD 06/23/2021, 10:40 AM  Cc: Colon Branch, MD

## 2021-06-23 NOTE — Patient Instructions (Signed)
If you are age 62 or older, your body mass index should be between 23-30. Your Body mass index is 31.8 kg/m. If this is out of the aforementioned range listed, please consider follow up with your Primary Care Provider.  If you are age 4 or younger, your body mass index should be between 19-25. Your Body mass index is 31.8 kg/m. If this is out of the aformentioned range listed, please consider follow up with your Primary Care Provider.   ________________________________________________________  The [place] [facility] providers would like to encourage you to use [facility] to communicate with providers for non-urgent requests or questions.  Due to long hold times on the telephone, sending your provider a message by [facility] may be a faster and more efficient way to get a response.  Please allow 48 business hours for a response.  Please remember that this is for non-urgent requests.  _______________________________________________________  Please go to the lab on the 2nd floor [address] before you leave the office today.   We have sent the following medications to your pharmacy for you to pick up at your convenience: Bentyl   Hold off on all supplements for 4 weeks. Stop excessive fiber  Please call in 3 months to schedule an office visit.  Please call in 3 business days to schedule CT Please have CT done by 07/20/2021 to avoid more lab work You have been scheduled for a CT scan of the abdomen and pelvis at [facility] ([address] [place], [place] [address] [address] Rad[facility]).   You are scheduled on       at              . You should arrive 15 minutes prior to your appointment time for registration. Please follow the written instructions below on the day of your exam:  WARNING: IF YOU ARE ALLERGIC TO IODINE/X-RAY DYE, PLEASE NOTIFY RADIOLOGY IMMEDIATELY AT [phone]! YOU WILL BE GIVEN A 13 HOUR PREMEDICATION PREP.  1) Do not eat or drink anything after           (4 hours prior to  your test) 2) You have been given 2 bottles of oral contrast to drink. The solution may taste better if refrigerated, but do NOT add ice or any other liquid to this solution. Shake well before drinking.    Drink 1 bottle of contrast @         (2 hours prior to your exam)  Drink 1 bottle of contrast @          (1 hour prior to your exam)  You may take any medications as prescribed with a small amount of water, if necessary. If you take any of the following medications: METFORMIN, GLUCOPHAGE, GLUCOVANCE, AVANDAMET, RIOMET, FORTAMET, [place] MET, JANUMET, GLUMETZA or METAGLIP, you MAY be asked to HOLD this medication 48 hours AFTER the exam.  The purpose of you drinking the oral contrast is to aid in the visualization of your intestinal tract. The contrast solution may cause some diarrhea. Depending on your individual set of symptoms, you may also receive an intravenous injection of x-ray contrast/dye. Plan on being at [facility] for 30 minutes or longer, depending on the type of exam you are having performed.  This test typically takes 30-45 minutes to complete.  If you have any questions regarding your exam or if you need to reschedule, you may call the CT department at [phone] between the hours of 8:00 am and 5:00 pm,  Monday-Friday.  ________________________________________________________________________   Lactose-Free Diet, Adult If you have lactose intolerance, you are not able to digest lactose. Lactose is a natural sugar found mainly in dairy milk and dairy products. A lactose-free diet can help you avoid foods and beverages that contain lactose. What are tips for following this plan? Reading food labels Do not consume foods, beverages, vitamins, minerals, or medicines containing lactose. Read ingredient lists carefully. Look for the words "lactose-free" on labels. Meal planning Use alternatives to dairy milk and foods made with milk products. These include the  following: Lactose-free milk. Soy milk with added calcium and vitamin D. Almond milk, coconut milk, rice milk, or other nondairy milk alternatives with added calcium and vitamin D. Note that a lot of these are low in protein. Soy products, such as soy yogurt, soy cheese, soy ice cream, and soy-based sour cream. Other nut milk products, such as almond yogurt, almond cheese, cashew yogurt, cashew cheese, cashew ice cream, coconut yogurt, and coconut ice cream. Medicines, vitamins, and supplements Use lactase enzyme drops or tablets as directed by your health care provider. Make sure you get enough calcium and vitamin D in your diet. A lactose-free eating plan can be lacking in these important nutrients. Take calcium and vitamin D supplements as directed by your health care provider. Talk with your health care provider about supplements if you are not able to get enough calcium and vitamin D from food. What foods should I eat? Fruits All fresh, canned, frozen, or dried fruits and fruit juices that are not processed with lactose. Vegetables All fresh, frozen, and canned vegetables without cheese, cream, or butter sauces. Grains Any that are not made with dairy milk or dairy products. Meats and other proteins Any meat, fish, poultry, and other protein sources that are not made with dairy milk or dairy products. Fats and oils Any that are not made with dairy milk or dairy products. Sweets and desserts Any that are not made with dairy milk or dairy products. Seasonings and condiments Any that are not made with dairy milk or dairy products. Calcium Calcium is found in many foods that contain lactose and is important for bone health. The amount of calcium you need depends on your age: Adults younger than 50 years: 1,000 mg of calcium a day. Adults older than 50 years: 1,200 mg of calcium a day. If you are not getting enough calcium, you may get it from other sources, including: Orange juice that  has been fortified with calcium. This means that calcium has been added to the product. There are 300-350 mg of calcium in 1 cup (237 mL) of calcium-fortified orange juice. Soy milk fortified with calcium. There are 300-400 mg of calcium in 1 cup (237 mL) of calcium-fortified soy milk. Rice or almond milk fortified with calcium. There are 300 mg of calcium in 1 cup (237 mL) of calcium-fortified rice or almond milk. Breakfast cereals fortified with calcium. There are 100-1,000 mg of calcium in calcium-fortified breakfast cereals. Spinach, cooked. There are 145 mg of calcium in  cup (90 g) of cooked spinach. Edamame, cooked. There are 130 mg of calcium in  cup (47 g) of cooked edamame. Collard greens, cooked. There are 125 mg of calcium in  cup (85 g) of cooked collard greens. Kale, frozen or cooked. There are 90 mg of calcium in  cup (59 g) of cooked or frozen kale. Almonds. There are 95 mg of calcium in  cup (35 g) of almonds. Broccoli, cooked. There  are 60 mg of calcium in 1 cup (156 g) of cooked broccoli. The items listed above may not be a complete list of foods and beverages you can eat. Contact a dietitian for more options. What foods should I avoid? Lactose is found in dairy milk and dairy products, such as: Yogurt. Cheese. Butter. Margarine. Sour cream. Cream. Whipped toppings and creamers. Ice cream and other dairy-based desserts. Lactose is also found in foods or products made with dairy milk or milk ingredients. To find out whether a food contains dairy milk or a milk ingredient, look at the ingredients list. Avoid foods with the statement "May contain milk" and foods that contain: Milk powder. Whey. Curd. Lactose. Lactoglobulin. The items listed above may not be a complete list of foods and beverages to avoid. Contact a dietitian for more information. Where to find more information [employer] of Diabetes and Digestive and Kidney Diseases:  [email or web address] Summary If you are lactose intolerant, it means that you are not able to digest lactose, a natural sugar found in milk and milk products. Following a lactose-free diet can help you manage this condition. Calcium is important for bone health and is found in many foods that contain lactose. Talk with your health care provider about other sources of calcium. This information is not intended to replace advice given to you by your health care provider. Make sure you discuss any questions you have with your health care provider. Document Revised: 05/18/2020 Document Reviewed: 05/18/2020 Elsevier Patient Education  2022 [place].   Thank you,  Dr. Jackquline Denmark

## 2021-06-25 ENCOUNTER — Ambulatory Visit (HOSPITAL_BASED_OUTPATIENT_CLINIC_OR_DEPARTMENT_OTHER)
Admission: RE | Admit: 2021-06-25 | Discharge: 2021-06-25 | Disposition: A | Discharge status: Home / Self Care | Payer: BC Managed Care – PPO | Source: Ambulatory Visit | Attending: Gastroenterology | Admitting: Gastroenterology

## 2021-06-25 ENCOUNTER — Other Ambulatory Visit: Payer: Self-pay

## 2021-06-25 ENCOUNTER — Encounter (HOSPITAL_BASED_OUTPATIENT_CLINIC_OR_DEPARTMENT_OTHER): Payer: Self-pay

## 2021-06-25 DIAGNOSIS — K582 Mixed irritable bowel syndrome: Secondary | ICD-10-CM | POA: Diagnosis present

## 2021-06-25 DIAGNOSIS — R140 Abdominal distension (gaseous): Secondary | ICD-10-CM | POA: Diagnosis present

## 2021-06-25 DIAGNOSIS — R1030 Lower abdominal pain, unspecified: Secondary | ICD-10-CM | POA: Insufficient documentation

## 2021-06-25 MED ORDER — IOHEXOL 300 MG/ML  SOLN
100.0000 mL | Freq: Once | INTRAMUSCULAR | Status: AC | PRN
  Administered 2021-06-25: 100 mL via INTRAVENOUS

## 2021-06-28 ENCOUNTER — Telehealth: Payer: Self-pay

## 2021-06-28 NOTE — Telephone Encounter (Signed)
Spoke w/ Pt- states he is doing okay, having congestion, headache, no SOB or CP. He did go to [facility] in [place] over weekend and was prescribed molnupiravir- he has been taking it as prescribed. Informed to let us know if not improving/worsening.

## 2021-06-28 NOTE — Telephone Encounter (Signed)
Nurse Assessment Nurse: Louretta Shorten, RN, [place] Date/Time Eilene Ghazi Time): 06/27/2021 12:49:32 PM Confirm and document reason for call. If symptomatic, describe symptoms. ---Caller states he tested positive for covid, cough, congestion, runny nose and headaches. Caller states sxs started last Thursday, but worsened this AM. Caller states he was exposed to covid on Christmas Day. Caller states he is vaccinated for covid. Does the patient have any new or worsening symptoms? ---Yes Will a triage be completed? ---Yes Related visit to physician within the last 2 weeks? ---No Does the PT have any chronic conditions? (i.e. diabetes, asthma, this includes High risk factors for pregnancy, etc.) ---Yes List chronic conditions. ---DM Is this a behavioral health or substance abuse call? ---No Guidelines Guideline Title Affirmed Question Affirmed Notes Nurse Date/Time (Eastern Time) COVID-19 - Diagnosed or Suspected MILD difficulty breathing (e.g., minimal/no SOB at rest, SOB with walking, pulse <100) Louretta Shorten, [place], [place] 06/27/2021 12:51:19 PM Disp. Time Eilene Ghazi Time) Disposition Final User PLEASE NOTE: All timestamps contained within this report are represented as [place] Standard Time. CONFIDENTIALTY NOTICE: This fax transmission is intended only for the addressee. It contains information that is legally privileged, confidential or otherwise protected from use or disclosure. If you are not the intended recipient, you are strictly prohibited from reviewing, disclosing, copying using or disseminating any of this information or taking any action in reliance on or regarding this information. If you have received this fax in error, please notify us immediately by telephone so that we can arrange for its return to [place]. Phone: [phone], Toll-Free: [phone], Fax: [phone] Page: 2 of 2 Call Id: [id number] 06/27/2021 12:53:37 PM See HCP within 4 Hours (or PCP triage) Yes Louretta Shorten, RN,  [place] Caller Disagree/Comply Comply Caller Understands Yes PreDisposition Call Doctor Care Advice Given Per Guideline SEE HCP (OR PCP TRIAGE) WITHIN 4 HOURS: * IF OFFICE WILL BE OPEN: You need to be seen within the next 3 or 4 hours. Call your doctor (or NP/PA) now or as soon as the office opens. * You become worse CALL BACK IF: Comments User: [place], [place], RN Date/Time Eilene Ghazi Time): 06/27/2021 12:51:09 PM BMI 31.8 User: [place], [place], RN Date/Time Eilene Ghazi Time): 06/27/2021 12:53:07 PM Current O2 sat 94%, HR 105 Referrals GO TO FACILITY UNDECIDED

## 2021-06-30 LAB — CELIAC PANEL 10
Antigliadin Abs, IgA: 3 units (ref 0–19)
Endomysial IgA: NEGATIVE
Gliadin IgG: 3 units (ref 0–19)
IgA/Immunoglobulin A, Serum: 237 mg/dL (ref 61–437)
Tissue Transglut Ab: <2 U/mL (ref 0–5)
Transglutaminase IgA: <2 U/mL (ref 0–3)

## 2021-07-03 ENCOUNTER — Other Ambulatory Visit: Payer: Self-pay | Admitting: Endocrinology

## 2021-07-18 ENCOUNTER — Other Ambulatory Visit: Payer: Self-pay | Admitting: Endocrinology

## 2021-07-19 ENCOUNTER — Telehealth: Payer: Self-pay | Admitting: Internal Medicine

## 2021-07-19 NOTE — Telephone Encounter (Signed)
Pt c/o Shortness Of Breath: STAT if SOB developed within the last 24 hours or pt is noticeably SOB on the phone  1. Are you currently SOB (can you hear that pt is SOB on the phone)? no  2. How long have you been experiencing SOB? Since having COVID 06/27/21.   3. Are you SOB when sitting or when up moving around? Laying down. Patient said he feels like he is going to be sick on his stomach. He has to sit up and it takes time for the feeling to resolve. It happens sporadically.   4. Are you currently experiencing any other symptoms? No  Patient is scheduled to see Danny [name] 08/02/21

## 2021-07-20 NOTE — Telephone Encounter (Signed)
Returned call to Pt.  Pt with recent covid infection with lingering symptoms.  He denies having any issues with afib.  Believes his current symptoms are related to covid infection.    Advised Pt to contact his PCP for further evaluation for lingering covid symptoms.  He indicates understanding.

## 2021-07-21 ENCOUNTER — Ambulatory Visit: Payer: BC Managed Care – PPO | Admitting: Gastroenterology

## 2021-07-21 ENCOUNTER — Other Ambulatory Visit: Payer: BC Managed Care – PPO

## 2021-07-21 ENCOUNTER — Encounter: Payer: Self-pay | Admitting: Gastroenterology

## 2021-07-21 ENCOUNTER — Other Ambulatory Visit: Payer: Self-pay

## 2021-07-21 VITALS — BP 142/80 | HR 70 | Ht 71.0 in | Wt 231.4 lb

## 2021-07-21 DIAGNOSIS — R140 Abdominal distension (gaseous): Secondary | ICD-10-CM | POA: Diagnosis not present

## 2021-07-21 DIAGNOSIS — R1030 Lower abdominal pain, unspecified: Secondary | ICD-10-CM | POA: Diagnosis not present

## 2021-07-21 DIAGNOSIS — K580 Irritable bowel syndrome with diarrhea: Secondary | ICD-10-CM | POA: Diagnosis not present

## 2021-07-21 DIAGNOSIS — K760 Fatty (change of) liver, not elsewhere classified: Secondary | ICD-10-CM | POA: Diagnosis not present

## 2021-07-21 NOTE — Patient Instructions (Signed)
If you are age 63 or younger, your body mass index should be between 19-25. Your Body mass index is 32.27 kg/m. If this is out of the aformentioned range listed, please consider follow up with your Primary Care Provider.   __________________________________________________________  The [place] [facility] providers would like to encourage you to use [facility] to communicate with providers for non-urgent requests or questions.  Due to long hold times on the telephone, sending your provider a message by [facility] may be a faster and more efficient way to get a response.  Please allow 48 business hours for a response.  Please remember that this is for non-urgent requests.    Due to recent changes in healthcare laws, you may see the results of your imaging and laboratory studies on MyChart before your provider has had a chance to review them.  We understand that in some cases there may be results that are confusing or concerning to you. Not all laboratory results come back in the same time frame and the provider may be waiting for multiple results in order to interpret others.  Please give [place] 48 hours in order for your provider to thoroughly review all the results before contacting the office for clarification of your results.   Please go to the lab on the 2nd floor [address] before you leave the office today.   Continue Bentyl.  Please follow up in 6 months. Give [place] a call at [phone] to schedule an appointment.   You have been scheduled for a gastric emptying scan at [facility] on 08/01/21 at 9:00am. Please arrive at least 15 minutes prior to your appointment for registration. Please make certain not to have anything to eat or drink after midnight the night before your test. Hold all stomach medications (ex: Zofran, phenergan, Reglan) 48 hours prior to your test. If you need to reschedule your appointment, please contact radiology scheduling at  [phone].  _____________________________________________________________________ A gastric-emptying study measures how long it takes for food to move through your stomach. There are several ways to measure stomach emptying. In the most common test, you eat food that contains a small amount of radioactive material. A scanner that detects the movement of the radioactive material is placed over your abdomen to monitor the rate at which food leaves your stomach. This test normally takes about 4 hours to complete. _____________________________________________________________________    Please try Gluten Free diet for 2 weeks. After 2 weeks please call and speak to nurse to provide [place] with an update on how you doing.  Gluten-Free Diet for Celiac Disease, Adult The gluten-free diet includes all foods that do not contain gluten. Gluten is a protein that is found in wheat, rye, barley, and some other grains. Following the gluten-free diet is the only treatment for people with celiac disease. It helps to prevent damage to the intestines and improves or eliminates the symptoms of celiac disease. Following the gluten-free diet requires some planning. It can be challenging at first, but it gets easier with time and practice. There are more gluten-free options available today than ever before. If you need help finding gluten-free foods or if you have questions, talk with your dietitian or your health care provider. What are tips for following this plan? Reading food labels Read all food labels. Gluten is often added to foods. Always check the ingredient list and look for warnings, such as "may contain gluten." Foods that list any of these key words on the label usually contain gluten: Wheat,  flour, enriched flour, bromated flour, white flour, durum flour, graham flour, phosphated flour, self-rising flour, semolina, farina, barley (malt), rye, and oats. Starch, dextrin, modified food starch, or  cereal. Thickening, fillers, or emulsifiers. Malt flavoring, malt extract, or malt syrup. Hydrolyzed vegetable protein. In the [place] packaged foods that are gluten-free are required to be labeled "GF." These foods should be easy to identify and are safe to eat. In the [place] food companies are also required to list common food allergens, including wheat, on their labels. Shopping When grocery shopping, start by shopping in the produce, meat, and dairy sections. These sections are more likely to contain gluten-free foods. Then move to the aisles that contain packaged foods if you need to. Meal planning All fruits, vegetables, and meats are safe to eat and do not contain gluten. Talk with your dietitian or health care provider before taking a gluten-free multivitamin or mineral supplement. Be aware of gluten-free foods having contact with foods that contain gluten (cross-contamination). This can happen at home and with any processed foods. Talk with your health care provider or dietitian about how to reduce the risk of cross-contamination in your home. If you have questions about how a food is processed, ask the manufacturer. What foods can I eat? Fruits All plain fresh, frozen, canned, and dried fruits, and 100% fruit juices. Vegetables All plain fresh, frozen, and canned vegetables. Grains Amaranth, bean flours, 100% buckwheat flour, corn, millet, nut flours or nut meals, GF oats, quinoa, rice, sorghum, teff, rice wafers, pure cornmeal tortillas, popcorn, and hot cereals made from cornmeal.  Hominy, rice, wild rice. Some Asian rice noodles or bean noodles.  Arrowroot starch, corn bran, corn flour, corn germ, cornmeal, corn starch, potato flour, potato starch flour, and rice bran. Plain, brown, and sweet rice flours. Rice polish, soy flour, and tapioca starch. Meats and other protein foods All fresh beef, pork, poultry, fish, seafood, and eggs. Fish canned in water, oil, brine, or vegetable  broth. Plain nuts and seeds, peanut butter.  Some precooked or cured meat, such as sausages or meat loaves. Some frankfurters. Dried beans, dried peas, and lentils. Dairy Fresh plain, dry, evaporated, or condensed milk. Cream, butter, sour cream, whipping cream, and most yogurts. Unprocessed cheese, most processed cheeses, some cottage cheeses, some cream cheeses. Beverages Coffee, tea, most herbal teas. Carbonated beverages and some root beers. Wine, sake, and distilled spirits, such as gin, vodka, and whiskey. Most hard ciders. Fats and oils Butter, margarine, vegetable oil, hydrogenated butter, olive oil, shortening, lard, cream, and some mayonnaise. Some commercial salad dressings. Olives. Sweets and desserts Sugar, honey, some syrups, molasses, jelly, and jam. Plain hard candy, marshmallows, and gumdrops.  Pure cocoa powder. Plain chocolate. Custard and some pudding mixes. Gelatin desserts, sorbets, frozen ice pops, and sherbet.  Cake, cookies, and other desserts prepared with allowed flours. Some commercial ice creams. Cornstarch, tapioca, and rice puddings. Seasoning and other foods Some canned or frozen soups. Monosodium glutamate (MSG). Cider, rice, and wine vinegar. Baking soda and baking powder.  Cream of tartar. Baking and nutritional yeast. Certain soy sauces made without wheat (ask your dietitian about specific brands that are allowed).  Nuts, coconut, and chocolate. Salt, pepper, herbs, spices, flavoring extracts, imitation or artificial flavorings, natural flavorings, and food colorings.  Some medicines and supplements. Rice syrups. The items listed above may not be a complete list of foods and beverages you can eat. Contact a dietitian for more information. What foods should I avoid? Fruits Thickened or prepared fruits  and some pie fillings. Some fruit snacks and fruit roll-ups. Vegetables Most creamed vegetables and most vegetables canned in sauces. Some commercially prepared  vegetables and salads. Vegetables in a soy sauce marinade or dressing. Grains Barley, bran, bulgur, couscous, cracked wheat, [place], farro, graham, malt, matzo, semolina, wheat germ, and all wheat and rye cereals including spelt and kamut.  Cereals containing malt as a flavoring, such as rice cereal. Noodles, spaghetti, macaroni, most packaged rice mixes, and all mixes containing wheat, rye, barley, or triticale. Meats and other protein foods Any meat or meat alternative containing wheat, rye, barley, or gluten stabilizers. These are often marinated or packaged meats, and precooked or cured meat, such as sausages or meat loaves. Bread-containing products, such as Swiss steak, croquettes, meatballs, and meatloaf. Most tuna canned in vegetable broth and [place] with hydrolyzed vegetable protein (HVP) injected as part of the basting. Seitan. Imitation fish. Eggs in sauces made from ingredients to avoid. Dairy Commercial chocolate milk drinks and malted milk. Some non-dairy creamers. Any cheese product containing ingredients to avoid. Beverages Certain cereal beverages. Beer, ale, malted milk, and some root beers. Some hard ciders. Some instant flavored coffees. Some herbal teas made with barley or with barley malt added. Fats and oils Some commercial salad dressings. Sour cream containing modified food starch. Sweets and desserts Some toffees. Chocolate-coated nuts (may be rolled in wheat flour) and some commercial candies and candy bars.  Most cakes, cookies, donuts, pastries, and other baked goods. Some commercial ice cream. Ice cream cones.  Commercially prepared mixes for cakes, cookies, and other desserts. Bread pudding and other puddings thickened with flour.  Products containing brown rice syrup made with barley malt enzyme. Desserts and sweets made with malt flavoring. Seasoning and other foods Some curry powders, some dry seasoning mixes, some gravy extracts, some meat sauces, some ketchups,  some prepared mustards, and horseradish.  Certain soy sauces. Malt vinegar. Bouillon and bouillon cubes that contain HVP. Some chip dips, and some chewing gum.  Yeast extract. Brewer's yeast. Caramel color.  Some medicines and supplements. The items listed above may not be a complete list of foods and beverages you should avoid. Contact a dietitian for more information. Summary Gluten is a protein that is found in wheat, rye, barley, and some other grains. The gluten-free diet includes all foods that do not contain gluten. If you need help finding gluten-free foods or if you have questions, talk with your dietitian or your health care provider. Read all food labels. Gluten is often added to foods. Always check the ingredient list and look for warnings, such as "may contain gluten." This information is not intended to replace advice given to you by your health care provider. Make sure you discuss any questions you have with your health care provider. Document Revised: 06/16/2019 Document Reviewed: 06/16/2019 Elsevier Patient Education  2022 [employer].  It was a pleasure to see you today!  Lynann Bologna, M.D.

## 2021-07-21 NOTE — Progress Notes (Signed)
Chief Complaint: GI problems  Referring Provider:  Colon Branch, MD      ASSESSMENT AND PLAN;   #1. Lower abdo pain with bloating. Neg CT AP 06/2021.   #2. IBS- D. Neg colon with Bx Feb 2019. Nl CBC, CMP, celiac serology, TSH.  #3. Fatty liver with Nl LFTs  Plan: -Continue Bentyl 49m po QID #120, 11 RF.  Take half an hour before meals and bedtime. -Stool studies for GI Pathogen (includes C. Diff), giardia antigen, fecal elastase, fat and Calprotectin. -Proceed with solid phase GES to rule out diabetic gastroparesis -Gluten free diet x 2 weeks. -FU in 24 weeks. If still with problems, SIBO breath test  Reviewed CT films with the patient. HPI:    Danny FULLis a 61y.o. male  A Fib on Eliquis, HTN, DM2, BPH, HLD, obesity, IBS-D  With lower abdo pain x 5-6 yrs, assoc bloating which gets exacerbated after eating.  Has been having diarrhea alternating with constipation.  More so diarrhea.  The abdominal pain has gotten worse over the last month and now occurs every day.  This would occasionally wake him up from sleep.  No nocturnal diarrhea.  No melena or hematochezia.   [facility]  Had negative CT Abdo/pelvis with p.o. and IV contrast except for hepatic steatosis.   He does give history suggestive of lactose intolerance.  Lately has been having more problems with bread and foods containing gluten.  His celiac screen was negative.  No jaundice dark urine or pale stools.  No weight loss.  No fever chills or night sweats.  No sodas, chocolates, chewing gums, artificial sweeteners and candy. No NSAIDs.  Bentyl helps previously but he ran out of medicines.  Labs from earlier this year revealed normal CBC, CMP, TSH.  He has gained weight as below  Wt Readings from Last 3 Encounters:  07/21/21 231 lb 6 oz (105 kg)  06/23/21 228 lb (103.4 kg)  05/10/21 224 lb (101.6 kg)    Previous GI  work-up: [facility] irregular Z-line. -S/p biopsies distal esophagus and gastric.  We do not have biopsy results. -Report sent for scanning.   [facility] (Indications-history of polyps, left lower quadrant abdominal pain, diarrhea) -Second-degree hemorrhoids -Otherwise normal colonoscopy -Negative random colon biopsies for microscopic colitis. -Repeat in 5 years -Report sent for scanning.  CT A/P with contrast 06/28/2021 1. No acute CT findings of the abdomen or pelvis to explain lower abdominal pain or bowel symptoms. 2. Hepatomegaly and hepatic steatosis. 3. Coronary artery disease.   Aortic Atherosclerosis (ICD10-I70.0).  Past Medical History:  Diagnosis Date   Allergic rhinitis    Benign prostatic hypertrophy    s/p TUNA procedure aprx 2006,   Carpal tunnel syndrome 06/17/2009   Diabetes mellitus    Type II dx 1999   DM type 2 with diabetic peripheral neuropathy [place] 10/15/2014   ED (erectile dysfunction)    After TUNA   Elevated LFTs 2010   (-) chronic hep  panel   History of cardiovascular stress test 8/11   Negative   Hyperlipidemia    HYPERSOMNIA UNSPECIFIED 12/11/2007   Hypertension    Obesity    [place] 12/[place]   a. Failed Multaq. b. Prev on flecainide. c. s/p afib ablation 2015.    Past Surgical History:  Procedure  Laterality Date   ABLATION  03/24/2014   PVI by Dr Rayann Heman   ATRIAL FIBRILLATION ABLATION N/A 03/24/2014   Procedure: ATRIAL FIBRILLATION ABLATION;  Surgeon: Coralyn Mark, MD;  Location: [place] CATH LAB;  Service: Cardiovascular;  Laterality: N/A;   COLONOSCOPY  11/25/2012   Mild sigmoid diverticulosis, small internal hemorrhoids, otherwise normal colonoscopy   CYSTOSCOPY  04/26/2008   neg  @ urology, was rx Flomax   PROSTATE SURGERY     TUNA   TEE WITHOUT CARDIOVERSION N/A 03/23/2014   Procedure: TRANSESOPHAGEAL  ECHOCARDIOGRAM (TEE);  Surgeon: Fay Records, MD;  Location: [facility] ENDOSCOPY;  Service: Cardiovascular;  Laterality: N/A;   VASECTOMY     several years ago    Family History  Problem Relation Age of Onset   Heart disease Mother    Diabetes Mother    Hypertension Mother    Congestive Heart Failure Mother    Heart attack Mother    Hypertension Father    Colon cancer Neg Hx    Prostate cancer Neg Hx     Social History   Tobacco Use   Smoking status: Former    Types: Cigarettes    Quit date: 06/26/1985    Years since quitting: 36.0   Smokeless tobacco: Never  Vaping Use   Vaping Use: Never used  Substance Use Topics   Alcohol use: No    Comment:     Drug use: No    Current Outpatient Medications  Medication Sig Dispense Refill   acetaminophen (TYLENOL) 650 MG CR tablet Take 650 mg by mouth every 8 (eight) hours as needed for pain.     azelastine (ASTELIN) 0.1 % nasal spray Place 2 sprays into both nostrils 2 (two) times daily. Use in each nostril as directed 30 mL 5   BD VEO INSULIN SYRINGE U/F 31G X 15/64" 0.5 ML MISC USE 1 SYRINGE AS DIRECTED 3 TO 4 TIMES DAILY 100 each 0   BINAXNOW COVID-19 AG HOME TEST KIT Use as Directed on the Package     Blood Glucose Calibration (FREESTYLE CONTROL SOLUTION) LIQD Use to calibrate freestyle libre 1 each 3   Blood Glucose Monitoring Suppl (ONETOUCH VERIO REFLECT) w/Device KIT 1 1 kit 0   Continuous Blood Gluc Sensor (DEXCOM G6 SENSOR) MISC USE AS DIRECTED TO  MONITOR  BLOOD  SUGAR,  CHANGE  EVERY  10  DAYS. 3 each 0   Continuous Blood Gluc Transmit (DEXCOM G6 TRANSMITTER) MISC See admin instructions.     dicyclomine (BENTYL) 10 MG capsule Take 1 capsule (10 mg total) by mouth 4 (four) times daily -  before meals and at bedtime. 120 capsule 11   ELIQUIS 5 MG TABS tablet TAKE 1 TABLET(5 MG) BY MOUTH TWICE DAILY 60 tablet 6   fish oil-omega-3 fatty acids 1000 MG capsule Take 2 g by mouth 2 (two) times daily.     fluticasone (FLONASE) 50 MCG/ACT  nasal spray SHAKE LIQUID AND USE 2 SPRAYS IN EACH NOSTRIL DAILY AS NEEDED FOR ALLERGIES OR RHINITIS 16 g 5   glucagon (GLUCAGON EMERGENCY) 1 MG injection Inject 1 mg into the vein once as needed. 1 each 5   glucose blood (ONETOUCH VERIO) test strip Use as instructed to check blood sugar 4 times per day dx code E11.9 125 each 3   Insulin Pen Needle (B-D ULTRAFINE III SHORT PEN) 31G X 8 MM MISC Use one per day 30 each 3   Insulin Pen Needle (BD PEN NEEDLE NANO U/F)  32G X 4 MM MISC Use to give insulin injects 4 times daily. 150 each 3   Lancets (ONETOUCH DELICA PLUS MPNTIR44R) MISC Use to check blood sugar 4 times per day 200 each 2   loratadine (CLARITIN) 10 MG tablet Take 10 mg by mouth daily.     NOVOLOG FLEXPEN 100 UNIT/ML FlexPen INJECT 10 TO 12 UNITS SUBCUTANEOUSLY THREE TIMES DAILY BEFORE MEAL(S) 15 mL 0   Polyethyl Glyc-Propyl Glyc PF 0.4-0.3 % SOLN Apply 2 drops to eye 3 (three) times daily as needed (for dry eyes).     pregabalin (LYRICA) 75 MG capsule TAKE 1 CAPSULE BY MOUTH THREE TIMES DAILY 90 capsule 5   simvastatin (ZOCOR) 20 MG tablet Take 1 tablet (20 mg total) by mouth at bedtime. 90 tablet 3   tamsulosin (FLOMAX) 0.4 MG CAPS capsule Take 1 capsule (0.4 mg total) by mouth daily. 90 capsule 3   TRESIBA FLEXTOUCH 100 UNIT/ML FlexTouch Pen INJECT 42 UNITS SUBCUTANEOUSLY ONCE DAILY 15 mL 0   XIGDUO XR 10-998 MG TB24 Take 1 tablet by mouth twice daily 180 tablet 0   No current facility-administered medications for this visit.    No Known Allergies  Review of Systems:  Psychiatric/Behavioral: No anxiety or depression.  Has sleeping problems.     Physical Exam:    BP (!) 142/80    Pulse 70    Ht 5' 11" (1.803 m)    Wt 231 lb 6 oz (105 kg)    SpO2 97%    BMI 32.27 kg/m  Wt Readings from Last 3 Encounters:  07/21/21 231 lb 6 oz (105 kg)  06/23/21 228 lb (103.4 kg)  05/10/21 224 lb (101.6 kg)   Constitutional:  Well-developed, in no acute distress. Psychiatric: Normal mood and  affect. Behavior is normal. HEENT: Pupils normal.  Conjunctivae are normal. No scleral icterus. Neck supple.  Cardiovascular: Normal rate, regular rhythm. No edema Pulmonary/chest: Effort normal and breath sounds normal. No wheezing, rales or rhonchi. Abdominal: Soft, nondistended. Nontender. Bowel sounds active throughout. There are no masses palpable. No hepatomegaly.  Small umbilical hernia.  Divarication of recti. Rectal: Deferred Neurological: Alert and oriented to person place and time. Skin: Skin is warm and dry. No rashes noted.  Data Reviewed: I have personally reviewed following labs and imaging studies  CBC: CBC Latest Ref Rng & Units 06/23/2021 11/16/2020 01/05/2020  WBC 4.0 - 10.5 K/uL 7.7 5.4 9.6  Hemoglobin 13.0 - 17.0 g/dL 15.5 15.9 16.2  Hematocrit 39.0 - 52.0 % 47.7 50.8 48.8  Platelets 150.0 - 400.0 K/uL 245.0 247 251.0    CMP: CMP Latest Ref Rng & Units 06/23/2021 03/29/2021 01/26/2021  Glucose 70 - 99 mg/dL 84 - 160(H)  BUN 6 - 23 mg/dL 15 - 14  Creatinine 0.40 - 1.50 mg/dL 1.07 - 1.02  Sodium 135 - 145 mEq/L 142 - 140  Potassium 3.5 - 5.1 mEq/L 4.4 - 4.0  Chloride 96 - 112 mEq/L 103 - 104  CO2 19 - 32 mEq/L 32 - 24  Calcium 8.4 - 10.5 mg/dL 9.3 - 9.0  Total Protein 6.0 - 8.3 g/dL 6.6 - -  Total Bilirubin 0.2 - 1.2 mg/dL 0.5 - -  Alkaline Phos 39 - 117 U/L 70 - -  AST 0 - 37 U/L 26 26 -  ALT 0 - 53 U/L 52 49 -   CT reviewed independently and with patient.   Carmell Austria, MD 07/21/2021, 1:45 PM  Cc: Colon Branch, MD

## 2021-07-25 ENCOUNTER — Other Ambulatory Visit: Payer: BC Managed Care – PPO

## 2021-07-25 ENCOUNTER — Other Ambulatory Visit: Payer: Self-pay | Admitting: Endocrinology

## 2021-07-25 DIAGNOSIS — K580 Irritable bowel syndrome with diarrhea: Secondary | ICD-10-CM

## 2021-07-25 DIAGNOSIS — K760 Fatty (change of) liver, not elsewhere classified: Secondary | ICD-10-CM

## 2021-07-25 DIAGNOSIS — R1030 Lower abdominal pain, unspecified: Secondary | ICD-10-CM

## 2021-07-25 DIAGNOSIS — R140 Abdominal distension (gaseous): Secondary | ICD-10-CM

## 2021-07-26 LAB — CALPROTECTIN, FECAL: Calprotectin, Fecal: <16 ug/g (ref 0–120)

## 2021-07-26 LAB — GIARDIA ANTIGEN
MICRO NUMBER:: 12936902
RESULT:: NOT DETECTED
SPECIMEN QUALITY:: ADEQUATE

## 2021-07-27 LAB — CLOSTRIDIUM DIFFICILE BY PCR: Toxigenic C. Difficile by PCR: NEGATIVE

## 2021-07-28 ENCOUNTER — Ambulatory Visit: Payer: BC Managed Care – PPO | Admitting: Podiatry

## 2021-07-28 ENCOUNTER — Encounter: Payer: Self-pay | Admitting: Podiatry

## 2021-07-28 ENCOUNTER — Other Ambulatory Visit: Payer: Self-pay | Admitting: *Deleted

## 2021-07-28 DIAGNOSIS — L601 Onycholysis: Secondary | ICD-10-CM

## 2021-07-28 DIAGNOSIS — M79676 Pain in unspecified toe(s): Secondary | ICD-10-CM

## 2021-07-28 DIAGNOSIS — L600 Ingrowing nail: Secondary | ICD-10-CM

## 2021-07-28 LAB — GI PROFILE, STOOL, PCR

## 2021-07-28 NOTE — Patient Instructions (Signed)

## 2021-07-28 NOTE — Progress Notes (Signed)
°  Subjective:  Patient ID: Danny [name], male    DOB: Apr 10, 1961,  MRN: [id number]  Chief Complaint  Patient presents with   Nail Problem    The left big toenail is stinging and there was some draining and hurts with walking and standing and pressure   61 y.o. male presents with the above complaint. History confirmed with patient. Denies injury. Present for 1 month, with draining. Pain with walking, standing, pressure. Has been soaking which help. Denies other issues  States right foot is feeling better after injection of the arch.  Objective:  Physical Exam: warm, good capillary refill, no trophic changes or ulcerative lesions, normal DP and PT pulses, and normal sensory exam.  Painful loose nail at left hallux; serosanguinous drainage noted  Assessment:   1. Onycholysis   2. Ingrown nail   3. Pain around toenail    Plan:  Patient was evaluated and treated and all questions answered.  Ingrown Nail -Discussed loose nail and that he would benefit from temporary removal today. Patient verbalized understanding and agreed to proceed.  Procedure: Avulsion of toenail Location: Left 1st toe  Anesthesia: Lidocaine 1% plain; 1.5 mL and Marcaine 0.5% plain; 1.5 mL, digital block. Skin Prep: Betadine. Dressing: Silvadene; telfa; dry, sterile, compression dressing. Technique: Following skin prep, the toe was exsanguinated and a tourniquet was secured at the base of the toe. The nail was freed and avulsed with a hemostat. The area was cleansed. The tourniquet was then removed and sterile dressing applied. Disposition: Patient tolerated procedure well.   Return if symptoms worsen or fail to improve.

## 2021-07-30 LAB — FECAL FAT, QUALITATIVE: FECAL FAT, QUALITATIVE: ABNORMAL — AB

## 2021-07-31 NOTE — Progress Notes (Signed)
Cardiology Office Note Date:  07/31/2021  Patient ID:  Danny [name], Danny [name] April 28, 1961, MRN [id number] PCP:  Wanda Plump, MD  Electrophysiologist: Dr. Johney Frame    Chief Complaint:  over due, near syncope   History of Present Illness: Danny [name] is a 61 y.o. male with history of DM, HTN, HLD, AFib  He comes in today to be seen for dr. Johney Frame, last seen by him via tele health visit may 2021, doing well without symptoms of AFib for years, he mentioned an episode that sounded of vertigo fleetingly, without associated symptoms or recurrence. Advised adequate hydration, if recurrent plan for monitoring and to see his PMD  He saw the Afib clinic most recently on 11/16/20, feeling well, no changes were made.   TODAY He reports that in the last 3 weeks or so something has changed, he thinks perhaps because his COVID illness he had back in Dec. (His 3rd)  He reports that in the last 2-3 weeks he has had 2 episodes that he feels suddenly like he is  going to black out, it is gone quickly and he has not completely fainted or fallen.  Both these occurred while standing/actively moving.  Once his wife saw him and when he "came out of it" she was rushing towards him thinking he was fainting. Lats only a few seconds. No associated symptoms  He has had some other episodes that perhaps have been different, evolves slower, and sits feeling better  Another symptom is a vague sense of something in chest, not pain, perhaps heavy, no palpitations and not SOB, but a feeling of unease.  Last night unable to go to sleep with the feeling that something was wrong, or something bad was about to happen.  Outside of the times of his symptoms he feels well with good exertional capacity.  He does not think he has had AFib since his ablation, his AFib symptoms clearly were different then what is happening now.  NO bleeding or signs of bleeding  He has wearable tech for HR, rhythm, and his BS via DexCom  transmitter, his BS have not been perfect last night checking perhaps in the 150's-160's, nothing markedly high or low Not sire on his BS readings with the other symptoms His watch has noted HRs occassionally in the 50s, no fast rates  AFib Hx Diagnosed 2011 Multaq, flecainide (pre-ablation) PVI ablation 03/24/2014   Past Medical History:  Diagnosis Date   Allergic rhinitis    Benign prostatic hypertrophy    s/p TUNA procedure aprx 2006,   Carpal tunnel syndrome 06/17/2009   Diabetes mellitus    Type II dx 1999   DM type 2 with diabetic peripheral neuropathy ([facility]) 10/15/2014   ED (erectile dysfunction)    After TUNA   Elevated LFTs 2010   (-) chronic hep  panel   History of cardiovascular stress test 8/11   Negative   Hyperlipidemia    HYPERSOMNIA UNSPECIFIED 12/11/2007   Hypertension    Obesity    PAF (paroxysmal atrial fibrillation) ([facility]) 12/21/2009   a. Failed Multaq. b. Prev on flecainide. c. s/p afib ablation 2015.    Past Surgical History:  Procedure Laterality Date   ABLATION  03/24/2014   PVI by Dr Johney Frame   ATRIAL FIBRILLATION ABLATION N/A 03/24/2014   Procedure: ATRIAL FIBRILLATION ABLATION;  Surgeon: Gardiner Rhyme, MD;  Location: [facility] CATH LAB;  Service: Cardiovascular;  Laterality: N/A;   COLONOSCOPY  11/25/2012   Mild sigmoid diverticulosis, small  internal hemorrhoids, otherwise normal colonoscopy   CYSTOSCOPY  04/26/2008   neg  @ urology, was rx Flomax   PROSTATE SURGERY     TUNA   TEE WITHOUT CARDIOVERSION N/A 03/23/2014   Procedure: TRANSESOPHAGEAL ECHOCARDIOGRAM (TEE);  Surgeon: Fay Records, MD;  Location: [facility] ENDOSCOPY;  Service: Cardiovascular;  Laterality: N/A;   VASECTOMY     several years ago    Current Outpatient Medications  Medication Sig Dispense Refill   acetaminophen (TYLENOL) 650 MG CR tablet Take 650 mg by mouth every 8 (eight) hours as needed for pain.     azelastine (ASTELIN) 0.1 % nasal spray Place 2 sprays into both nostrils 2 (two)  times daily. Use in each nostril as directed 30 mL 5   BD VEO INSULIN SYRINGE U/F 31G X 15/64" 0.5 ML MISC USE 1 SYRINGE AS DIRECTED 3 TO 4 TIMES DAILY 100 each 0   BINAXNOW COVID-19 AG HOME TEST KIT Use as Directed on the Package     Blood Glucose Calibration (FREESTYLE CONTROL SOLUTION) LIQD Use to calibrate freestyle libre 1 each 3   Blood Glucose Monitoring Suppl (ONETOUCH VERIO REFLECT) w/Device KIT 1 1 kit 0   Continuous Blood Gluc Sensor (DEXCOM G6 SENSOR) MISC USE AS DIRECTED TO  MONITOR  BLOOD  SUGAR,  CHANGE  EVERY  10  DAYS. 3 each 0   Continuous Blood Gluc Transmit (DEXCOM G6 TRANSMITTER) MISC USE AS DIRECTED 1 each 0   dicyclomine (BENTYL) 10 MG capsule Take 1 capsule (10 mg total) by mouth 4 (four) times daily -  before meals and at bedtime. 120 capsule 11   ELIQUIS 5 MG TABS tablet TAKE 1 TABLET(5 MG) BY MOUTH TWICE DAILY 60 tablet 6   famotidine (PEPCID) 40 MG tablet Take 40 mg by mouth at bedtime.     fish oil-omega-3 fatty acids 1000 MG capsule Take 2 g by mouth 2 (two) times daily.     fluticasone (FLONASE) 50 MCG/ACT nasal spray SHAKE LIQUID AND USE 2 SPRAYS IN EACH NOSTRIL DAILY AS NEEDED FOR ALLERGIES OR RHINITIS 16 g 5   glucagon (GLUCAGON EMERGENCY) 1 MG injection Inject 1 mg into the vein once as needed. 1 each 5   glucose blood (ONETOUCH VERIO) test strip Use as instructed to check blood sugar 4 times per day dx code E11.9 125 each 3   Insulin Pen Needle (B-D ULTRAFINE III SHORT PEN) 31G X 8 MM MISC Use one per day 30 each 3   Insulin Pen Needle (BD PEN NEEDLE NANO U/F) 32G X 4 MM MISC Use to give insulin injects 4 times daily. 150 each 3   LAGEVRIO 200 MG CAPS capsule Take by mouth.     Lancets (ONETOUCH DELICA PLUS [id number]) MISC Use to check blood sugar 4 times per day 200 each 2   loratadine (CLARITIN) 10 MG tablet Take 10 mg by mouth daily.     NOVOLOG FLEXPEN 100 UNIT/ML FlexPen INJECT 10 TO 12 UNITS SUBCUTANEOUSLY THREE TIMES DAILY BEFORE MEAL(S) 15 mL 0    Polyethyl Glyc-Propyl Glyc PF 0.4-0.3 % SOLN Apply 2 drops to eye 3 (three) times daily as needed (for dry eyes).     predniSONE (DELTASONE) 20 MG tablet Take 20 mg by mouth daily.     pregabalin (LYRICA) 75 MG capsule TAKE 1 CAPSULE BY MOUTH THREE TIMES DAILY 90 capsule 5   simvastatin (ZOCOR) 20 MG tablet Take 1 tablet (20 mg total) by mouth at bedtime. 90 tablet 3  tamsulosin (FLOMAX) 0.4 MG CAPS capsule Take 1 capsule (0.4 mg total) by mouth daily. 90 capsule 3   TRESIBA FLEXTOUCH 100 UNIT/ML FlexTouch Pen INJECT 42 UNITS SUBCUTANEOUSLY ONCE DAILY 15 mL 0   XIGDUO XR 10-998 MG TB24 Take 1 tablet by mouth twice daily 180 tablet 0   No current facility-administered medications for this visit.    Allergies:   Patient has no known allergies.   Social History:  The patient  reports that he quit smoking about 36 years ago. His smoking use included cigarettes. He has never used smokeless tobacco. He reports that he does not drink alcohol and does not use drugs.   Family History:  The patient's family history includes Congestive Heart Failure in his mother; Diabetes in his mother; Heart attack in his mother; Heart disease in his mother; Hypertension in his father and mother.  ROS:  Please see the history of present illness.    All other systems are reviewed and otherwise negative.   PHYSICAL EXAM:  VS:  There were no vitals taken for this visit. BMI: There is no height or weight on file to calculate BMI. Well nourished, well developed, in no acute distress HEENT: normocephalic, atraumatic Neck: no JVD, carotid bruits or masses Cardiac:  RRR; no significant murmurs, no rubs, or gallops Lungs:  CTA b/l, no wheezing, rhonchi or rales Abd: soft, nontender MS: no deformity or atrophy Ext:  no edema Skin: warm and dry, no rash Neuro:  No gross deficits appreciated Psych: euthymic mood, full affect    EKG:  Done today and reviewed by myself shows  SR 74bpm, no significant changes from  prior    03/25/2014: TTE Study Conclusions  - Left ventricle: The cavity size was normal. Systolic function was    normal. Wall motion was normal; there were no regional wall    motion abnormalities. Left ventricular diastolic function    parameters were normal.  - Aortic valve: There was no regurgitation.  - Aortic root: The aortic root was normal in size.  - Mitral valve: Structurally normal valve. There was no    regurgitation.  - Left atrium: The atrium was normal in size.  - Right ventricle: Systolic function was normal.  - Right atrium: The atrium was normal in size.  - Tricuspid valve: There was no regurgitation.  - Pulmonic valve: There was no regurgitation.  - Pulmonary arteries: Systolic pressure was within the normal    range.  - Pericardium, extracardiac: There was no pericardial effusion.   Impressions: - Normal study.   Recent Labs: 03/29/2021: TSH 1.77 06/23/2021: ALT 52; BUN 15; Creatinine, Ser 1.07; Hemoglobin 15.5; Platelets 245.0; Potassium 4.4; Sodium 142  03/29/2021: Cholesterol 114; Direct LDL 62.0; HDL 34.20; Total CHOL/HDL Ratio 3; Triglycerides 239.0; VLDL 47.8   CrCl cannot be calculated (Patient's most recent lab result is older than the maximum 21 days allowed.).   Wt Readings from Last 3 Encounters:  07/21/21 231 lb 6 oz (105 kg)  06/23/21 228 lb (103.4 kg)  05/10/21 224 lb (101.6 kg)     Other studies reviewed: Additional studies/records reviewed today include: summarized above  ASSESSMENT AND PLAN:  Paroxysmal Afib CHA2DS2Vasc is 2, on Eliquis, appropriately dosed zero burden by symptoms  HTN A little high today  3. Symptoms as discussed above 4. Near syncope Unsure how to put his symptoms together Given risk factors, DM especially will plan coronary CT for his chest heaviness Event monitor today (live monitoring) [facility]  D/w DOD Any escalation in symptoms or syncope to seek attention, pt is aware. I have also  cautioning him on driving, though none of his near syncope or weak spells have occurred seated either   Disposition: F/u with back in 4-6 weeks, sooner if needed  Current medicines are reviewed at length with the patient today.  The patient did not have any concerns regarding medicines.  Venetia Night, PA-C 07/31/2021 7:55 AM     [address] [place] [address] [phone] (office)  [phone] (fax)

## 2021-08-01 ENCOUNTER — Other Ambulatory Visit: Payer: Self-pay

## 2021-08-01 ENCOUNTER — Encounter (HOSPITAL_COMMUNITY)
Admission: RE | Admit: 2021-08-01 | Discharge: 2021-08-01 | Disposition: A | Discharge status: Home / Self Care | Payer: BC Managed Care – PPO | Source: Ambulatory Visit | Attending: Gastroenterology | Admitting: Gastroenterology

## 2021-08-01 DIAGNOSIS — R1030 Lower abdominal pain, unspecified: Secondary | ICD-10-CM | POA: Insufficient documentation

## 2021-08-01 DIAGNOSIS — K580 Irritable bowel syndrome with diarrhea: Secondary | ICD-10-CM | POA: Diagnosis present

## 2021-08-01 DIAGNOSIS — K760 Fatty (change of) liver, not elsewhere classified: Secondary | ICD-10-CM | POA: Insufficient documentation

## 2021-08-01 DIAGNOSIS — R140 Abdominal distension (gaseous): Secondary | ICD-10-CM | POA: Diagnosis present

## 2021-08-01 MED ORDER — TECHNETIUM TC 99M SULFUR COLLOID
2.1000 | Freq: Once | INTRAVENOUS | Status: AC | PRN
Start: 2021-08-01 — End: 2021-08-01
  Administered 2021-08-01: 2.1 via ORAL

## 2021-08-02 ENCOUNTER — Ambulatory Visit (INDEPENDENT_AMBULATORY_CARE_PROVIDER_SITE_OTHER): Payer: BC Managed Care – PPO

## 2021-08-02 ENCOUNTER — Encounter: Payer: Self-pay | Admitting: *Deleted

## 2021-08-02 ENCOUNTER — Ambulatory Visit: Payer: BC Managed Care – PPO | Admitting: Physician Assistant

## 2021-08-02 ENCOUNTER — Other Ambulatory Visit: Payer: Self-pay

## 2021-08-02 ENCOUNTER — Encounter: Payer: Self-pay | Admitting: Physician Assistant

## 2021-08-02 VITALS — BP 148/76 | HR 74 | Ht 71.0 in | Wt 227.6 lb

## 2021-08-02 DIAGNOSIS — R079 Chest pain, unspecified: Secondary | ICD-10-CM | POA: Diagnosis not present

## 2021-08-02 DIAGNOSIS — Z79899 Other long term (current) drug therapy: Secondary | ICD-10-CM | POA: Diagnosis not present

## 2021-08-02 DIAGNOSIS — R55 Syncope and collapse: Secondary | ICD-10-CM

## 2021-08-02 LAB — PANCREATIC ELASTASE, FECAL: Pancreatic Elastase-1, Stool: >500 mcg/g

## 2021-08-02 MED ORDER — METOPROLOL TARTRATE 100 MG PO TABS: 100.0000 mg | ORAL_TABLET | Freq: Once | ORAL | 0 refills | Status: DC

## 2021-08-02 NOTE — Progress Notes (Signed)
Patient ID: Danny [name], male   DOB: 1961-04-07, 61 y.o.   MRN: [id number] [employer] cardiac event monitor serial # [id number] from office inventory applied to patient.

## 2021-08-02 NOTE — Patient Instructions (Addendum)
Medication Instructions:   ON THE DAY OF YOUR PROCEDURE  TWO HOURS PRIOR TAKE METOPROLOL 100 MG TABLET ONCE   *If you need a refill on your cardiac medications before your next appointment, please call your pharmacy*   Lab Work:  CMET CBC AND MAG TODAY   If you have labs (blood work) drawn today and your tests are completely normal, you will receive your results only by: [place] (if you have MyChart) OR A paper copy in the mail If you have any lab test that is abnormal or we need to change your treatment, we will call you to review the results.   Testing/Procedures: Your physician has requested that you have an echocardiogram. Echocardiography is a painless test that uses sound waves to create images of your heart. It provides your doctor with information about the size and shape of your heart and how well your hearts chambers and valves are working. This procedure takes approximately one hour. There are no restrictions for this procedure.   Your physician has requested that you have a carotid duplex. This test is an ultrasound of the carotid arteries in your neck. It looks at blood flow through these arteries that supply the brain with blood. Allow one hour for this exam. There are no restrictions or special instructions.   Non-Cardiac CT Angiography (CTA), is a special type of CT scan that uses a computer to produce multi-dimensional views of major blood vessels throughout the body. In CT angiography, a contrast material is injected through an IV to help visualize the blood vessels    Your physician has recommended that you wear an event monitor. Event monitors are medical devices that record the hearts electrical activity. Doctors most often [place] these monitors to diagnose arrhythmias. Arrhythmias are problems with the speed or rhythm of the heartbeat. The monitor is a small, portable device. You can wear one while you do your normal daily activities. This is usually used to  diagnose what is causing palpitations/syncope (passing out). MONITOR DEPARTMENT ASSISTED WITH PUTTING MONITOR ON IN CLINIC    Follow-Up: At [facility], you and your health needs are our priority.  As part of our continuing mission to provide you with exceptional heart care, we have created designated Provider Care Teams.  These Care Teams include your primary Cardiologist (physician) and Advanced Practice Providers (APPs -  Physician Assistants and Nurse Practitioners) who all work together to provide you with the care you need, when you need it.  We recommend signing up for the patient portal called "MyChart".  Sign up information is provided on this After Visit Summary.  MyChart is used to connect with patients for Virtual Visits (Telemedicine).  Patients are able to view lab/test results, encounter notes, upcoming appointments, etc.  Non-urgent messages can be sent to your provider as well.   To learn more about what you can do with MyChart, go to [email or web address].    Your next appointment:   4-6 week(s)  The format for your next appointment:   In Person  Provider:   Lars Mage, MD    Other Instructions    Your cardiac CT will be scheduled at one of the below locations:   [facility] [address] [place], [place] [address] [phone]  [facility] [address] [place], [place] [address] [phone]  If scheduled at [facility], please arrive at the [facility] main entrance (entrance A) of [facility]  30 minutes prior to test start time. You can use the FREE valet parking offered at the main entrance (encouraged to control the heart rate for the test) Proceed to the [facility] Radiology Department (first floor) to check-in and test prep.  If scheduled at [facility], please arrive 15 mins early for check-in and test prep.  Please follow these  instructions carefully (unless otherwise directed):  Hold all erectile dysfunction medications at least 3 days (72 hrs) prior to test.  On the Night Before the Test: Be sure to Drink plenty of water. Do not consume any caffeinated/decaffeinated beverages or chocolate 12 hours prior to your test. Do not take any antihistamines 12 hours prior to your test.  On the Day of the Test: Drink plenty of water until 1 hour prior to the test. Do not eat any food 4 hours prior to the test. You may take your regular medications prior to the test.  Take metoprolol (Lopressor) two hours prior to test. HOLD Furosemide/Hydrochlorothiazide morning of the test.       After the Test: Drink plenty of water. After receiving IV contrast, you may experience a mild flushed feeling. This is normal. On occasion, you may experience a mild rash up to 24 hours after the test. This is not dangerous. If this occurs, you can take Benadryl 25 mg and increase your fluid intake. If you experience trouble breathing, this can be serious. If it is severe call 911 IMMEDIATELY. If it is mild, please call our office. If you take any of these medications: Glipizide/Metformin, Avandament, Glucavance, please do not take 48 hours after completing test unless otherwise instructed.  We will call to schedule your test 2-4 weeks out understanding that some insurance companies will need an authorization prior to the service being performed.   For non-scheduling related questions, please contact the cardiac imaging nurse navigator should you have any questions/concerns: Marchia Bond, Cardiac Imaging Nurse Navigator Gordy Clement, Cardiac Imaging Nurse Navigator [facility] Direct Office Dial: [phone]   For scheduling needs, including cancellations and rescheduling, please call [place], [phone].

## 2021-08-03 LAB — CBC
Hematocrit: 50.3 % (ref 37.5–51.0)
Hemoglobin: 17 g/dL (ref 13.0–17.7)
MCH: 27.8 pg (ref 26.6–33.0)
MCHC: 33.8 g/dL (ref 31.5–35.7)
MCV: 82 fL (ref 79–97)
Platelets: 268 10*3/uL (ref 150–450)
RBC: 6.11 x10E6/uL — ABNORMAL HIGH (ref 4.14–5.80)
RDW: 14.2 % (ref 11.6–15.4)
WBC: 8.2 10*3/uL (ref 3.4–10.8)

## 2021-08-03 LAB — COMPREHENSIVE METABOLIC PANEL
ALT: 56 IU/L — ABNORMAL HIGH (ref 0–44)
AST: 41 IU/L — ABNORMAL HIGH (ref 0–40)
Albumin/Globulin Ratio: 1.8 (ref 1.2–2.2)
Albumin: 4.8 g/dL (ref 3.8–4.8)
Alkaline Phosphatase: 83 IU/L (ref 44–121)
BUN/Creatinine Ratio: 15 (ref 10–24)
BUN: 14 mg/dL (ref 8–27)
Bilirubin Total: 0.2 mg/dL (ref 0.0–1.2)
CO2: 25 mmol/L (ref 20–29)
Calcium: 9.6 mg/dL (ref 8.6–10.2)
Chloride: 103 mmol/L (ref 96–106)
Creatinine, Ser: 0.91 mg/dL (ref 0.76–1.27)
Globulin, Total: 2.6 g/dL (ref 1.5–4.5)
Glucose: 174 mg/dL — ABNORMAL HIGH (ref 70–99)
Potassium: 4.4 mmol/L (ref 3.5–5.2)
Sodium: 142 mmol/L (ref 134–144)
Total Protein: 7.4 g/dL (ref 6.0–8.5)
eGFR: 95 mL/min/{1.73_m2} (ref >59)

## 2021-08-03 LAB — MAGNESIUM: Magnesium: 2.4 mg/dL — ABNORMAL HIGH (ref 1.6–2.3)

## 2021-08-04 ENCOUNTER — Telehealth: Payer: Self-pay | Admitting: *Deleted

## 2021-08-04 NOTE — Telephone Encounter (Signed)
-----   Message from [facility], [place] sent at 08/04/2021  7:30 AM EST ----- Labs look ok.  LFTs minimally elevated, will send to his PMD.  Nothing to explain his symptoms by the labs

## 2021-08-04 NOTE — Telephone Encounter (Signed)
Spoke with patient and aware of results and recommendations to forward to Pcp with verbalization of understanding.

## 2021-08-06 ENCOUNTER — Other Ambulatory Visit: Payer: Self-pay | Admitting: Endocrinology

## 2021-08-09 ENCOUNTER — Encounter: Payer: Self-pay | Admitting: Internal Medicine

## 2021-08-11 ENCOUNTER — Ambulatory Visit: Payer: BC Managed Care – PPO | Admitting: Podiatry

## 2021-08-11 ENCOUNTER — Encounter: Payer: Self-pay | Admitting: Podiatry

## 2021-08-11 DIAGNOSIS — Z9889 Other specified postprocedural states: Secondary | ICD-10-CM

## 2021-08-11 NOTE — Progress Notes (Signed)
Subjective:  Danny [name] presents today s/p temporary total nail avulsion L hallux.  Patient states he has been following post procedure instructions. Reports no pain, no redness, no pus, no drainage. Pre-procedure pain symptoms have resolved.  Objective: There were no vitals filed for this visit.  61 y.o. male in NAD.Marland Kitchen AAO X 3.  CFT immediate b/l LE. Palpable DP/PT pulses b/l LE. Digital hair present b/l. Skin temperature gradient WNL b/l. No pain with calf compression b/l. No edema noted b/l. No cyanosis or clubbing noted b/l LE.  Pedal skin is warm and supple b/l LE. No open wounds b/l LE. No interdigital macerations noted b/l LE. Procedure site of L hallux noted to be completely healed with no erythema, no edema, no drainage, no purulence.   Normal muscle strength 5/5 to all lower extremity muscle groups bilaterally. No pain, crepitus or joint limitation noted with ROM b/l LE. No gross bony pedal deformities b/l. Patient ambulates independently without assistive aids.  Protective sensation intact 5/5 intact bilaterally with 10g monofilament b/l. Vibratory sensation intact b/l.  Assessment: s/p temporary total nail avulsion L hallux  Plan: -Examined patient. -Patient advised to discontinue epsom salt soaks. Betadine paint applied to nailbed. He may start leaving open to air when at home. When working, he may protect with band-aid as he wears steel toe boots. -Patient/POA to call should there be question/concern in the interim.  Return if symptoms worsen or fail to improve.  Marzetta Board, DPM

## 2021-08-12 ENCOUNTER — Telehealth (HOSPITAL_COMMUNITY): Payer: Self-pay | Admitting: *Deleted

## 2021-08-12 NOTE — Telephone Encounter (Signed)
Reaching out to patient to offer assistance regarding upcoming cardiac imaging study; pt verbalizes understanding of appt date/time, parking situation and where to check in, pre-test NPO status and medications ordered, and verified current allergies; name and call back number provided for further questions should they arise  Larey Brick RN Navigator Cardiac Imaging [facility] [phone] office [phone] cell  Patient to take 100mg  metoprolol tartrate two hours prior to his cardiac CT scan.  He is aware to arrive at 3:30pm for his 4pm scan.

## 2021-08-15 ENCOUNTER — Other Ambulatory Visit: Payer: Self-pay | Admitting: Cardiovascular Disease

## 2021-08-15 ENCOUNTER — Encounter (HOSPITAL_COMMUNITY): Payer: Self-pay

## 2021-08-15 ENCOUNTER — Other Ambulatory Visit: Payer: Self-pay

## 2021-08-15 ENCOUNTER — Ambulatory Visit (HOSPITAL_COMMUNITY)
Admission: RE | Admit: 2021-08-15 | Discharge: 2021-08-15 | Disposition: A | Discharge status: Home / Self Care | Payer: BC Managed Care – PPO | Source: Ambulatory Visit | Attending: Physician Assistant | Admitting: Physician Assistant

## 2021-08-15 DIAGNOSIS — R931 Abnormal findings on diagnostic imaging of heart and coronary circulation: Secondary | ICD-10-CM

## 2021-08-15 DIAGNOSIS — R079 Chest pain, unspecified: Secondary | ICD-10-CM | POA: Insufficient documentation

## 2021-08-15 DIAGNOSIS — I2510 Atherosclerotic heart disease of native coronary artery without angina pectoris: Secondary | ICD-10-CM

## 2021-08-15 MED ORDER — NITROGLYCERIN 0.4 MG SL SUBL: 0.8000 mg | SUBLINGUAL_TABLET | Freq: Once | SUBLINGUAL | Status: AC

## 2021-08-15 MED ORDER — IOHEXOL 350 MG/ML SOLN
95.0000 mL | Freq: Once | INTRAVENOUS | Status: AC | PRN
  Administered 2021-08-15: 95 mL via INTRAVENOUS

## 2021-08-15 MED ORDER — NITROGLYCERIN 0.4 MG SL SUBL
SUBLINGUAL_TABLET | SUBLINGUAL | Status: AC
  Administered 2021-08-15: 0.8 mg via SUBLINGUAL
  Filled 2021-08-15: qty 2

## 2021-08-16 ENCOUNTER — Ambulatory Visit (HOSPITAL_COMMUNITY)
Admission: RE | Admit: 2021-08-16 | Discharge: 2021-08-16 | Disposition: A | Discharge status: Home / Self Care | Payer: BC Managed Care – PPO | Source: Ambulatory Visit | Attending: Cardiovascular Disease | Admitting: Cardiovascular Disease

## 2021-08-16 DIAGNOSIS — R931 Abnormal findings on diagnostic imaging of heart and coronary circulation: Secondary | ICD-10-CM | POA: Diagnosis not present

## 2021-08-18 ENCOUNTER — Other Ambulatory Visit: Payer: Self-pay

## 2021-08-18 ENCOUNTER — Ambulatory Visit (HOSPITAL_COMMUNITY): Payer: BC Managed Care – PPO | Attending: Cardiology

## 2021-08-18 ENCOUNTER — Other Ambulatory Visit: Payer: Self-pay | Admitting: Endocrinology

## 2021-08-18 DIAGNOSIS — R55 Syncope and collapse: Secondary | ICD-10-CM | POA: Diagnosis present

## 2021-08-18 DIAGNOSIS — R079 Chest pain, unspecified: Secondary | ICD-10-CM | POA: Insufficient documentation

## 2021-08-18 LAB — ECHOCARDIOGRAM COMPLETE
Area-P 1/2: 3.66 cm2
S' Lateral: 3.5 cm

## 2021-08-23 ENCOUNTER — Ambulatory Visit (HOSPITAL_COMMUNITY)
Admission: RE | Admit: 2021-08-23 | Discharge: 2021-08-23 | Disposition: A | Discharge status: Home / Self Care | Payer: BC Managed Care – PPO | Source: Ambulatory Visit | Attending: Cardiology | Admitting: Cardiology

## 2021-08-23 ENCOUNTER — Other Ambulatory Visit: Payer: Self-pay

## 2021-08-23 DIAGNOSIS — R55 Syncope and collapse: Secondary | ICD-10-CM | POA: Diagnosis not present

## 2021-08-23 NOTE — H&P (View-Only) (Signed)
PCP:  Danny Branch, MD Primary Cardiologist: Danny Ruths, MD Electrophysiologist: Danny Grayer, MD   Danny [name] is a 61 y.o. male seen today for Danny Grayer, MD for routine electrophysiology followup.    Seen in office 08/02/2021. Reported 2-3 weeks he had 2 episodes that he feels suddenly like he is going to "black out". Recovers quickly and no frank syncope or loss of muscle tone/fall. Both these occurred while standing/actively moving. Wife witnessed one episode and thought he was about to "faint". She rushed towards him but he "came out of it". Lasts only a few seconds.    He has had some other episodes that perhaps have been different, evolves slower, and sits feeling better   Also has had vague sense of chest discomfort. Perhaps heavy, no palpitations and not SOB, but a feeling of unease. One night unable to go to sleep with the feeling that something was wrong, or something bad was about to happen.  Work up from that visit includes VAS US Carotids 08/23/2021  CT Coronary 08/16/2021 Severe 3vD coronary calcium with score of 2233. FFR CT negative except for borderline in distal LAD  Echo 08/18/2021 LVEF 60-65%, normal RV  Monitor Pending  Since last being seen in our clinic the patient reports doing about the same.  He has continued to have chest discomfort described as pressure/heaviness. He had one further episode of lightheadedness yesterday, which was one of the longest yet. No alert from monitor, which he is still wearing.  His chest discomfort has woken him up several times. The longest it has lasted is around 5 minutes. No clear exertional component, but usually short lived.   Past Medical History:  Diagnosis Date   Allergic rhinitis    Benign prostatic hypertrophy    s/p TUNA procedure aprx 2006,   Carpal tunnel syndrome 06/17/2009   Diabetes mellitus    Type II dx 1999   DM type 2 with diabetic peripheral neuropathy ([place]) 10/15/2014   ED (erectile dysfunction)     After TUNA   Elevated LFTs 2010   (-) chronic hep  panel   History of cardiovascular stress test 8/11   Negative   Hyperlipidemia    HYPERSOMNIA UNSPECIFIED 12/11/2007   Hypertension    Obesity    PAF (paroxysmal atrial fibrillation) ([place]) 12/21/2009   a. Failed Multaq. b. Prev on flecainide. c. s/p afib ablation 2015.   Past Surgical History:  Procedure Laterality Date   ABLATION  03/24/2014   PVI by Dr Rayann Heman   ATRIAL FIBRILLATION ABLATION N/A 03/24/2014   Procedure: ATRIAL FIBRILLATION ABLATION;  Surgeon: Coralyn Mark, MD;  Location: [facility] CATH LAB;  Service: Cardiovascular;  Laterality: N/A;   COLONOSCOPY  11/25/2012   Mild sigmoid diverticulosis, small internal hemorrhoids, otherwise normal colonoscopy   CYSTOSCOPY  04/26/2008   neg  @ urology, was rx Flomax   PROSTATE SURGERY     TUNA   TEE WITHOUT CARDIOVERSION N/A 03/23/2014   Procedure: TRANSESOPHAGEAL ECHOCARDIOGRAM (TEE);  Surgeon: Fay Records, MD;  Location: [facility] ENDOSCOPY;  Service: Cardiovascular;  Laterality: N/A;   VASECTOMY     several years ago    Current Outpatient Medications  Medication Sig Dispense Refill   acetaminophen (TYLENOL) 650 MG CR tablet Take 650 mg by mouth every 8 (eight) hours as needed for pain.     azelastine (ASTELIN) 0.1 % nasal spray Place 2 sprays into both nostrils 2 (two) times daily. Use in each nostril as directed 30  mL 5   BD VEO INSULIN SYRINGE U/F 31G X 15/64" 0.5 ML MISC USE 1 SYRINGE AS DIRECTED 3 TO 4 TIMES DAILY 100 each 0   Blood Glucose Monitoring Suppl (ONETOUCH VERIO REFLECT) w/Device KIT 1 1 kit 0   Continuous Blood Gluc Sensor (DEXCOM G6 SENSOR) MISC USE AS DIRECTED TO  MONITOR  BLOOD  SUGAR,  CHANGE  EVERY  10  DAYS 3 each 0   Continuous Blood Gluc Transmit (DEXCOM G6 TRANSMITTER) MISC USE AS DIRECTED 1 each 0   dicyclomine (BENTYL) 10 MG capsule Take 1 capsule (10 mg total) by mouth 4 (four) times daily -  before meals and at bedtime. 120 capsule 11   ELIQUIS 5 MG  TABS tablet TAKE 1 TABLET(5 MG) BY MOUTH TWICE DAILY 60 tablet 6   famotidine (PEPCID) 40 MG tablet Take 40 mg by mouth at bedtime.     fish oil-omega-3 fatty acids 1000 MG capsule Take 2 g by mouth 2 (two) times daily.     fluticasone (FLONASE) 50 MCG/ACT nasal spray SHAKE LIQUID AND USE 2 SPRAYS IN EACH NOSTRIL DAILY AS NEEDED FOR ALLERGIES OR RHINITIS 16 g 5   glucose blood (ONETOUCH VERIO) test strip Use as instructed to check blood sugar 4 times per day dx code E11.9 125 each 3   Insulin Pen Needle (BD PEN NEEDLE NANO U/F) 32G X 4 MM MISC Use to give insulin injects 4 times daily. 150 each 3   Lancets (ONETOUCH DELICA PLUS ZHGDJM42A) MISC Use to check blood sugar 4 times per day 200 each 2   loratadine (CLARITIN) 10 MG tablet Take 10 mg by mouth daily.     NOVOLOG FLEXPEN 100 UNIT/ML FlexPen INJECT 10 TO 12 UNITS SUBCUTANEOUSLY THREE TIMES DAILY BEFORE MEAL(S) 15 mL 2   Polyethyl Glyc-Propyl Glyc PF 0.4-0.3 % SOLN Apply 2 drops to eye 3 (three) times daily as needed (for dry eyes).     pregabalin (LYRICA) 75 MG capsule TAKE 1 CAPSULE BY MOUTH THREE TIMES DAILY 90 capsule 5   simvastatin (ZOCOR) 20 MG tablet Take 1 tablet (20 mg total) by mouth at bedtime. 90 tablet 3   tamsulosin (FLOMAX) 0.4 MG CAPS capsule Take 1 capsule (0.4 mg total) by mouth daily. 90 capsule 3   TRESIBA FLEXTOUCH 100 UNIT/ML FlexTouch Pen INJECT 42 UNITS SUBCUTANEOUSLY ONCE DAILY 15 mL 2   XIGDUO XR 10-998 MG TB24 Take 1 tablet by mouth twice daily 180 tablet 2   No current facility-administered medications for this visit.    No Known Allergies  Social History   Socioeconomic History   Marital status: Married    Spouse name: Not on file   Number of children: 3   Years of education: GED   Highest education level: Not on file  Occupational History   Occupation: Works for [employer] (state).  Drives a Energy manager: [employer]    Comment: works for [employer] (state)  Tobacco Use   Smoking status: Former     Types: Cigarettes    Quit date: 06/26/1985    Years since quitting: 36.1   Smokeless tobacco: Never  Vaping Use   Vaping Use: Never used  Substance and Sexual Activity   Alcohol use: No    Comment:     Drug use: No   Sexual activity: Not on file  Other Topics Concern   Not on file  Social History Narrative   Lives with wife in a one story  Right handed    One story home   3 children         Social Determinants of Health   Financial Resource Strain: Not on file  Food Insecurity: Not on file  Transportation Needs: Not on file  Physical Activity: Not on file  Stress: Not on file  Social Connections: Not on file  Intimate Partner Violence: Not on file     Review of Systems: All other systems reviewed and are otherwise negative except as noted above.  Physical Exam: Vitals:   08/24/21 1023  BP: 130/74  Pulse: 67  SpO2: 96%  Weight: 228 lb (103.4 kg)  Height: '5\' 11"'  (1.803 m)    GEN- The patient is well appearing, alert and oriented x 3 today.   HEENT: normocephalic, atraumatic; sclera clear, conjunctiva pink; hearing intact; oropharynx clear; neck supple, no JVP Lymph- no cervical lymphadenopathy Lungs- Clear to ausculation bilaterally, normal work of breathing.  No wheezes, rales, rhonchi Heart- Regular rate and rhythm, no murmurs, rubs or gallops, PMI not laterally displaced GI- soft, non-tender, non-distended, bowel sounds present, no hepatosplenomegaly Extremities- no clubbing, cyanosis, or edema; DP/PT/radial pulses 2+ bilaterally MS- no significant deformity or atrophy Skin- warm and dry, no rash or lesion Psych- euthymic mood, full affect Neuro- strength and sensation are intact  EKG is not ordered. Personal review of EKG from  08/02/2021  shows NSR 74 bpm  Additional studies reviewed include: Previous EP office notes.   Assessment and Plan:  Paroxysmal Afib CHA2DS2Vasc is 2, on Eliquis, appropriately dosed zero burden by symptoms   HTN A little  high today   3. Symptoms as discussed above 4. Near syncope Unsure how to put his symptoms together Given risk factors, DM especially will plan coronary CT for his chest heaviness Event monitor today (live monitoring) Echo 08/18/2021 LVEF 60-65% Carotids 08/23/21 normal Labs   5. Chest discomfort As above, CT coronary concerning for hemodynamically significant CAD in the LAD and chest discomfort with typical and atypical features, will plan for left heart catheterization. He has a relatively high pre-test probability based off of his CT results.  Will also prescribe NTG tablets to use prn.  Alarm symptoms reviewed as well as reasons to call 911 such as chest pain that has worsened in severity or frequency, especially if involving syncope, diaphoresis, SOB at rest, or pain does not relieve after 15 minutes despite NTG administration(s).   Follow up with  Dr. Jacalyn Lefevre  team post cath. Follow up with EP pending monitor.   Shirley Friar, PA-C  08/24/21 10:35 AM

## 2021-08-23 NOTE — Progress Notes (Signed)
PCP:  Colon Branch, MD Primary Cardiologist: Kirk Ruths, MD Electrophysiologist: Thompson Grayer, MD   Danny [name] is a 61 y.o. male seen today for Thompson Grayer, MD for routine electrophysiology followup.    Seen in office 08/02/2021. Reported 2-3 weeks he had 2 episodes that he feels suddenly like he is going to "black out". Recovers quickly and no frank syncope or loss of muscle tone/fall. Both these occurred while standing/actively moving. Wife witnessed one episode and thought he was about to "faint". She rushed towards him but he "came out of it". Lasts only a few seconds.    He has had some other episodes that perhaps have been different, evolves slower, and sits feeling better   Also has had vague sense of chest discomfort. Perhaps heavy, no palpitations and not SOB, but a feeling of unease. One night unable to go to sleep with the feeling that something was wrong, or something bad was about to happen.  Work up from that visit includes VAS US Carotids 08/23/2021  CT Coronary 08/16/2021 Severe 3vD coronary calcium with score of 2233. FFR CT negative except for borderline in distal LAD  Echo 08/18/2021 LVEF 60-65%, normal RV  Monitor Pending  Since last being seen in our clinic the patient reports doing about the same.  He has continued to have chest discomfort described as pressure/heaviness. He had one further episode of lightheadedness yesterday, which was one of the longest yet. No alert from monitor, which he is still wearing.  His chest discomfort has woken him up several times. The longest it has lasted is around 5 minutes. No clear exertional component, but usually short lived.   Past Medical History:  Diagnosis Date   Allergic rhinitis    Benign prostatic hypertrophy    s/p TUNA procedure aprx 2006,   Carpal tunnel syndrome 06/17/2009   Diabetes mellitus    Type II dx 1999   DM type 2 with diabetic peripheral neuropathy ([place]) 10/15/2014   ED (erectile dysfunction)     After TUNA   Elevated LFTs 2010   (-) chronic hep  panel   History of cardiovascular stress test 8/11   Negative   Hyperlipidemia    HYPERSOMNIA UNSPECIFIED 12/11/2007   Hypertension    Obesity    PAF (paroxysmal atrial fibrillation) ([place]) 12/21/2009   a. Failed Multaq. b. Prev on flecainide. c. s/p afib ablation 2015.   Past Surgical History:  Procedure Laterality Date   ABLATION  03/24/2014   PVI by Dr Rayann Heman   ATRIAL FIBRILLATION ABLATION N/A 03/24/2014   Procedure: ATRIAL FIBRILLATION ABLATION;  Surgeon: Coralyn Mark, MD;  Location: [facility] CATH LAB;  Service: Cardiovascular;  Laterality: N/A;   COLONOSCOPY  11/25/2012   Mild sigmoid diverticulosis, small internal hemorrhoids, otherwise normal colonoscopy   CYSTOSCOPY  04/26/2008   neg  @ urology, was rx Flomax   PROSTATE SURGERY     TUNA   TEE WITHOUT CARDIOVERSION N/A 03/23/2014   Procedure: TRANSESOPHAGEAL ECHOCARDIOGRAM (TEE);  Surgeon: Fay Records, MD;  Location: [facility] ENDOSCOPY;  Service: Cardiovascular;  Laterality: N/A;   VASECTOMY     several years ago    Current Outpatient Medications  Medication Sig Dispense Refill   acetaminophen (TYLENOL) 650 MG CR tablet Take 650 mg by mouth every 8 (eight) hours as needed for pain.     azelastine (ASTELIN) 0.1 % nasal spray Place 2 sprays into both nostrils 2 (two) times daily. Use in each nostril as directed 30  mL 5   BD VEO INSULIN SYRINGE U/F 31G X 15/64" 0.5 ML MISC USE 1 SYRINGE AS DIRECTED 3 TO 4 TIMES DAILY 100 each 0   Blood Glucose Monitoring Suppl (ONETOUCH VERIO REFLECT) w/Device KIT 1 1 kit 0   Continuous Blood Gluc Sensor (DEXCOM G6 SENSOR) MISC USE AS DIRECTED TO  MONITOR  BLOOD  SUGAR,  CHANGE  EVERY  10  DAYS 3 each 0   Continuous Blood Gluc Transmit (DEXCOM G6 TRANSMITTER) MISC USE AS DIRECTED 1 each 0   dicyclomine (BENTYL) 10 MG capsule Take 1 capsule (10 mg total) by mouth 4 (four) times daily -  before meals and at bedtime. 120 capsule 11   ELIQUIS 5 MG  TABS tablet TAKE 1 TABLET(5 MG) BY MOUTH TWICE DAILY 60 tablet 6   famotidine (PEPCID) 40 MG tablet Take 40 mg by mouth at bedtime.     fish oil-omega-3 fatty acids 1000 MG capsule Take 2 g by mouth 2 (two) times daily.     fluticasone (FLONASE) 50 MCG/ACT nasal spray SHAKE LIQUID AND USE 2 SPRAYS IN EACH NOSTRIL DAILY AS NEEDED FOR ALLERGIES OR RHINITIS 16 g 5   glucose blood (ONETOUCH VERIO) test strip Use as instructed to check blood sugar 4 times per day dx code E11.9 125 each 3   Insulin Pen Needle (BD PEN NEEDLE NANO U/F) 32G X 4 MM MISC Use to give insulin injects 4 times daily. 150 each 3   Lancets (ONETOUCH DELICA PLUS [id number]) MISC Use to check blood sugar 4 times per day 200 each 2   loratadine (CLARITIN) 10 MG tablet Take 10 mg by mouth daily.     NOVOLOG FLEXPEN 100 UNIT/ML FlexPen INJECT 10 TO 12 UNITS SUBCUTANEOUSLY THREE TIMES DAILY BEFORE MEAL(S) 15 mL 2   Polyethyl Glyc-Propyl Glyc PF 0.4-0.3 % SOLN Apply 2 drops to eye 3 (three) times daily as needed (for dry eyes).     pregabalin (LYRICA) 75 MG capsule TAKE 1 CAPSULE BY MOUTH THREE TIMES DAILY 90 capsule 5   simvastatin (ZOCOR) 20 MG tablet Take 1 tablet (20 mg total) by mouth at bedtime. 90 tablet 3   tamsulosin (FLOMAX) 0.4 MG CAPS capsule Take 1 capsule (0.4 mg total) by mouth daily. 90 capsule 3   TRESIBA FLEXTOUCH 100 UNIT/ML FlexTouch Pen INJECT 42 UNITS SUBCUTANEOUSLY ONCE DAILY 15 mL 2   XIGDUO XR 10-998 MG TB24 Take 1 tablet by mouth twice daily 180 tablet 2   No current facility-administered medications for this visit.    No Known Allergies  Social History   Socioeconomic History   Marital status: Married    Spouse name: Not on file   Number of children: 3   Years of education: GED   Highest education level: Not on file  Occupational History   Occupation: Works for [employer] (state).  Drives a Energy manager: [employer]    Comment: works for [employer] (state)  Tobacco Use   Smoking status: Former     Types: Cigarettes    Quit date: 06/26/1985    Years since quitting: 36.1   Smokeless tobacco: Never  Vaping Use   Vaping Use: Never used  Substance and Sexual Activity   Alcohol use: No    Comment:     Drug use: No   Sexual activity: Not on file  Other Topics Concern   Not on file  Social History Narrative   Lives with wife in a one story  Right handed    One story home   3 children         Social Determinants of Health   Financial Resource Strain: Not on file  Food Insecurity: Not on file  Transportation Needs: Not on file  Physical Activity: Not on file  Stress: Not on file  Social Connections: Not on file  Intimate Partner Violence: Not on file     Review of Systems: All other systems reviewed and are otherwise negative except as noted above.  Physical Exam: Vitals:   08/24/21 1023  BP: 130/74  Pulse: 67  SpO2: 96%  Weight: 228 lb (103.4 kg)  Height: '5\' 11"'  (1.803 m)    GEN- The patient is well appearing, alert and oriented x 3 today.   HEENT: normocephalic, atraumatic; sclera clear, conjunctiva pink; hearing intact; oropharynx clear; neck supple, no JVP Lymph- no cervical lymphadenopathy Lungs- Clear to ausculation bilaterally, normal work of breathing.  No wheezes, rales, rhonchi Heart- Regular rate and rhythm, no murmurs, rubs or gallops, PMI not laterally displaced GI- soft, non-tender, non-distended, bowel sounds present, no hepatosplenomegaly Extremities- no clubbing, cyanosis, or edema; DP/PT/radial pulses 2+ bilaterally MS- no significant deformity or atrophy Skin- warm and dry, no rash or lesion Psych- euthymic mood, full affect Neuro- strength and sensation are intact  EKG is not ordered. Personal review of EKG from  08/02/2021  shows NSR 74 bpm  Additional studies reviewed include: Previous EP office notes.   Assessment and Plan:  Paroxysmal Afib CHA2DS2Vasc is 2, on Eliquis, appropriately dosed zero burden by symptoms   HTN A little  high today   3. Symptoms as discussed above 4. Near syncope Unsure how to put his symptoms together Given risk factors, DM especially will plan coronary CT for his chest heaviness Event monitor today (live monitoring) Echo 08/18/2021 LVEF 60-65% Carotids 08/23/21 normal Labs   5. Chest discomfort As above, CT coronary concerning for hemodynamically significant CAD in the LAD and chest discomfort with typical and atypical features, will plan for left heart catheterization. He has a relatively high pre-test probability based off of his CT results.  Will also prescribe NTG tablets to use prn.  Alarm symptoms reviewed as well as reasons to call 911 such as chest pain that has worsened in severity or frequency, especially if involving syncope, diaphoresis, SOB at rest, or pain does not relieve after 15 minutes despite NTG administration(s).   Follow up with  Dr. Jacalyn Lefevre  team post cath. Follow up with EP pending monitor.   Shirley Friar, PA-C  08/24/21 10:35 AM

## 2021-08-24 ENCOUNTER — Encounter: Payer: Self-pay | Admitting: Student

## 2021-08-24 ENCOUNTER — Ambulatory Visit: Payer: BC Managed Care – PPO | Admitting: Student

## 2021-08-24 VITALS — BP 130/74 | HR 67 | Ht 71.0 in | Wt 228.0 lb

## 2021-08-24 DIAGNOSIS — R55 Syncope and collapse: Secondary | ICD-10-CM | POA: Diagnosis not present

## 2021-08-24 DIAGNOSIS — R079 Chest pain, unspecified: Secondary | ICD-10-CM | POA: Diagnosis not present

## 2021-08-24 DIAGNOSIS — I480 Paroxysmal atrial fibrillation: Secondary | ICD-10-CM

## 2021-08-24 LAB — CBC
Hematocrit: 47.6 % (ref 37.5–51.0)
Hemoglobin: 16.4 g/dL (ref 13.0–17.7)
MCH: 28.2 pg (ref 26.6–33.0)
MCHC: 34.5 g/dL (ref 31.5–35.7)
MCV: 82 fL (ref 79–97)
Platelets: 278 10*3/uL (ref 150–450)
RBC: 5.82 x10E6/uL — ABNORMAL HIGH (ref 4.14–5.80)
RDW: 13.5 % (ref 11.6–15.4)
WBC: 6.3 10*3/uL (ref 3.4–10.8)

## 2021-08-24 LAB — BASIC METABOLIC PANEL
BUN/Creatinine Ratio: 14 (ref 10–24)
BUN: 14 mg/dL (ref 8–27)
CO2: 24 mmol/L (ref 20–29)
Calcium: 9.2 mg/dL (ref 8.6–10.2)
Chloride: 100 mmol/L (ref 96–106)
Creatinine, Ser: 1.02 mg/dL (ref 0.76–1.27)
Glucose: 200 mg/dL — ABNORMAL HIGH (ref 70–99)
Potassium: 4.4 mmol/L (ref 3.5–5.2)
Sodium: 138 mmol/L (ref 134–144)
eGFR: 83 mL/min/{1.73_m2} (ref >59)

## 2021-08-24 LAB — HM DIABETES EYE EXAM

## 2021-08-24 MED ORDER — NITROGLYCERIN 0.4 MG SL SUBL: 0.4000 mg | SUBLINGUAL_TABLET | SUBLINGUAL | 3 refills | Status: DC | PRN

## 2021-08-24 NOTE — Patient Instructions (Signed)
Medication Instructions:  ?Your physician has recommended you make the following change in your medication:  ? ?START: Nitroglycerin as needed for chest pain. Take one every 5 minutes but if you have to take 3 you need to call 911.  ? ?*If you need a refill on your cardiac medications before your next appointment, please call your pharmacy* ? ? ?Lab Work: ?TODAY: BMET, CBC ? ?If you have labs (blood work) drawn today and your tests are completely normal, you will receive your results only by: ?MyChart Message (if you have MyChart) OR ?A paper copy in the mail ?If you have any lab test that is abnormal or we need to change your treatment, we will call you to review the results. ? ? ?Testing/Procedures: ?See letter for Cardiac Cath Instructions ? ?Follow-Up: ?At [facility], you and your health needs are our priority.  As part of our continuing mission to provide you with exceptional heart care, we have created designated Provider Care Teams.  These Care Teams include your primary Cardiologist (physician) and Advanced Practice Providers (APPs -  Physician Assistants and Nurse Practitioners) who all work together to provide you with the care you need, when you need it. ? ? ?Your next appointment:   ?3-4 week(s) ? ?The format for your next appointment:   ?In Person ? ?Provider:   ?Olga Millers, MD   ? ?

## 2021-08-29 ENCOUNTER — Telehealth: Payer: Self-pay | Admitting: *Deleted

## 2021-08-29 NOTE — Telephone Encounter (Signed)
Cardiac catheterization scheduled at [facility] for: Tuesday August 30, 2021 7:30 AM ?[facility] Main Entrance A at: 5:30 AM ? ? ?Diet-no solid food after midnight prior to cath, clear liquids until 5 AM day of procedure. ? ?Medication instructions for procedure: ?-Hold: ? Eliquis-none 08/28/21 until post procedure ? Xigduo-day of procedure and 48 hours post procedure ? Insulin-AM of procedure ? Treshiba-1/2 usual dose HS prior to procedure ?-Except hold medications usual morning medications can be taken pre-cath with sips of water including aspirin 81 mg. ?   ?Must have responsible adult to drive home post procedure and be with patient first 24 hours after arriving home. ? ?[facility] does allow one visitor to wait in the waiting room during the time you are there. ? ? ?Patient reports does not currently have any new symptoms concerning for COVID-19 and no household members with COVID-19 like illness.  ? ? ?Reviewed procedure instructions with patient.  ? ?   ? ? ? ? ?

## 2021-08-30 ENCOUNTER — Other Ambulatory Visit: Payer: Self-pay

## 2021-08-30 ENCOUNTER — Encounter (HOSPITAL_COMMUNITY)
Admission: RE | Disposition: A | Discharge status: Home / Self Care | Payer: Self-pay | Source: Home / Self Care | Attending: Cardiovascular Disease

## 2021-08-30 ENCOUNTER — Ambulatory Visit (HOSPITAL_COMMUNITY)
Admission: RE | Admit: 2021-08-30 | Discharge: 2021-08-30 | Disposition: A | Discharge status: Home / Self Care | Payer: BC Managed Care – PPO | Attending: Cardiovascular Disease | Admitting: Cardiovascular Disease

## 2021-08-30 ENCOUNTER — Encounter (HOSPITAL_COMMUNITY): Payer: Self-pay | Admitting: Cardiovascular Disease

## 2021-08-30 DIAGNOSIS — E1142 Type 2 diabetes mellitus with diabetic polyneuropathy: Secondary | ICD-10-CM | POA: Diagnosis not present

## 2021-08-30 DIAGNOSIS — I10 Essential (primary) hypertension: Secondary | ICD-10-CM | POA: Insufficient documentation

## 2021-08-30 DIAGNOSIS — Z87891 Personal history of nicotine dependence: Secondary | ICD-10-CM | POA: Insufficient documentation

## 2021-08-30 DIAGNOSIS — R55 Syncope and collapse: Secondary | ICD-10-CM | POA: Insufficient documentation

## 2021-08-30 DIAGNOSIS — E785 Hyperlipidemia, unspecified: Secondary | ICD-10-CM | POA: Insufficient documentation

## 2021-08-30 DIAGNOSIS — I480 Paroxysmal atrial fibrillation: Secondary | ICD-10-CM | POA: Insufficient documentation

## 2021-08-30 DIAGNOSIS — R0789 Other chest pain: Secondary | ICD-10-CM | POA: Insufficient documentation

## 2021-08-30 DIAGNOSIS — I2510 Atherosclerotic heart disease of native coronary artery without angina pectoris: Secondary | ICD-10-CM | POA: Diagnosis not present

## 2021-08-30 DIAGNOSIS — R931 Abnormal findings on diagnostic imaging of heart and coronary circulation: Secondary | ICD-10-CM

## 2021-08-30 DIAGNOSIS — Z7901 Long term (current) use of anticoagulants: Secondary | ICD-10-CM | POA: Diagnosis not present

## 2021-08-30 DIAGNOSIS — Z79899 Other long term (current) drug therapy: Secondary | ICD-10-CM | POA: Insufficient documentation

## 2021-08-30 DIAGNOSIS — Z794 Long term (current) use of insulin: Secondary | ICD-10-CM | POA: Diagnosis not present

## 2021-08-30 HISTORY — PX: LEFT HEART CATH AND CORONARY ANGIOGRAPHY: CATH118249

## 2021-08-30 LAB — GLUCOSE, CAPILLARY: Glucose-Capillary: 169 mg/dL — ABNORMAL HIGH (ref 70–99)

## 2021-08-30 SURGERY — LEFT HEART CATH AND CORONARY ANGIOGRAPHY
Anesthesia: LOCAL

## 2021-08-30 MED ORDER — ACETAMINOPHEN 325 MG PO TABS: 650.0000 mg | ORAL_TABLET | ORAL | Status: DC | PRN

## 2021-08-30 MED ORDER — HEPARIN (PORCINE) IN NACL 1000-0.9 UT/500ML-% IV SOLN
INTRAVENOUS | Status: DC | PRN
  Administered 2021-08-30 (×2): 500 mL

## 2021-08-30 MED ORDER — SODIUM CHLORIDE 0.9 % WEIGHT BASED INFUSION: 1.0000 mL/kg/h | INTRAVENOUS | Status: DC

## 2021-08-30 MED ORDER — VERAPAMIL HCL 2.5 MG/ML IV SOLN
INTRAVENOUS | Status: DC | PRN
  Administered 2021-08-30: 10 mL via INTRA_ARTERIAL

## 2021-08-30 MED ORDER — MIDAZOLAM HCL 2 MG/2ML IJ SOLN
INTRAMUSCULAR | Status: DC | PRN
  Administered 2021-08-30: 2 mg via INTRAVENOUS

## 2021-08-30 MED ORDER — HEPARIN (PORCINE) IN NACL 1000-0.9 UT/500ML-% IV SOLN
INTRAVENOUS | Status: AC
  Filled 2021-08-30: qty 1000

## 2021-08-30 MED ORDER — SODIUM CHLORIDE 0.9 % IV SOLN: 250.0000 mL | INTRAVENOUS | Status: DC | PRN

## 2021-08-30 MED ORDER — ONDANSETRON HCL 4 MG/2ML IJ SOLN: 4.0000 mg | Freq: Four times a day (QID) | INTRAMUSCULAR | Status: DC | PRN

## 2021-08-30 MED ORDER — HYDRALAZINE HCL 20 MG/ML IJ SOLN: 10.0000 mg | INTRAMUSCULAR | Status: DC | PRN

## 2021-08-30 MED ORDER — LABETALOL HCL 5 MG/ML IV SOLN: 10.0000 mg | INTRAVENOUS | Status: DC | PRN

## 2021-08-30 MED ORDER — SODIUM CHLORIDE 0.9% FLUSH: 3.0000 mL | Freq: Two times a day (BID) | INTRAVENOUS | Status: DC

## 2021-08-30 MED ORDER — SODIUM CHLORIDE 0.9% FLUSH: 3.0000 mL | INTRAVENOUS | Status: DC | PRN

## 2021-08-30 MED ORDER — IOHEXOL 350 MG/ML SOLN
INTRAVENOUS | Status: DC | PRN
  Administered 2021-08-30: 65 mL

## 2021-08-30 MED ORDER — ASPIRIN 81 MG PO CHEW: 81.0000 mg | CHEWABLE_TABLET | ORAL | Status: DC

## 2021-08-30 MED ORDER — LIDOCAINE HCL (PF) 1 % IJ SOLN
INTRAMUSCULAR | Status: DC | PRN
  Administered 2021-08-30: 2 mL

## 2021-08-30 MED ORDER — SODIUM CHLORIDE 0.9 % IV SOLN: INTRAVENOUS | Status: DC

## 2021-08-30 MED ORDER — MIDAZOLAM HCL 2 MG/2ML IJ SOLN
INTRAMUSCULAR | Status: AC
  Filled 2021-08-30: qty 2

## 2021-08-30 MED ORDER — METOPROLOL SUCCINATE ER 25 MG PO TB24: 25.0000 mg | ORAL_TABLET | Freq: Every day | ORAL | Status: DC

## 2021-08-30 MED ORDER — VERAPAMIL HCL 2.5 MG/ML IV SOLN
INTRAVENOUS | Status: AC
  Filled 2021-08-30: qty 2

## 2021-08-30 MED ORDER — LIDOCAINE HCL (PF) 1 % IJ SOLN
INTRAMUSCULAR | Status: AC
  Filled 2021-08-30: qty 30

## 2021-08-30 MED ORDER — HEPARIN SODIUM (PORCINE) 1000 UNIT/ML IJ SOLN
INTRAMUSCULAR | Status: DC | PRN
  Administered 2021-08-30: 5000 [IU] via INTRAVENOUS

## 2021-08-30 MED ORDER — FENTANYL CITRATE (PF) 100 MCG/2ML IJ SOLN
INTRAMUSCULAR | Status: DC | PRN
Start: 2021-08-30 — End: 2021-08-30
  Administered 2021-08-30: 25 ug via INTRAVENOUS

## 2021-08-30 MED ORDER — FENTANYL CITRATE (PF) 100 MCG/2ML IJ SOLN
INTRAMUSCULAR | Status: AC
  Filled 2021-08-30: qty 2

## 2021-08-30 MED ORDER — SODIUM CHLORIDE 0.9 % WEIGHT BASED INFUSION
3.0000 mL/kg/h | INTRAVENOUS | Status: DC
  Administered 2021-08-30: 3 mL/kg/h via INTRAVENOUS

## 2021-08-30 SURGICAL SUPPLY — 11 items
CATH INFINITI JR4 5F (CATHETERS) ×1 IMPLANT
CATH OPTITORQUE TIG 4.0 5F (CATHETERS) ×1 IMPLANT
DEVICE RAD COMP TR BAND LRG (VASCULAR PRODUCTS) ×1 IMPLANT
GLIDESHEATH SLEND SS 6F .021 (SHEATH) ×1 IMPLANT
GUIDEWIRE INQWIRE 1.5J.035X260 (WIRE) IMPLANT
INQWIRE 1.5J .035X260CM (WIRE) ×2
KIT HEART LEFT (KITS) ×2 IMPLANT
PACK CARDIAC CATHETERIZATION (CUSTOM PROCEDURE TRAY) ×2 IMPLANT
SHEATH PROBE COVER 6X72 (BAG) ×2 IMPLANT
TRANSDUCER W/STOPCOCK (MISCELLANEOUS) ×2 IMPLANT
TUBING CIL FLEX 10 FLL-RA (TUBING) ×2 IMPLANT

## 2021-08-30 NOTE — Interval H&P Note (Signed)
Cath Lab Visit (complete for each Cath Lab visit) ? ?Clinical Evaluation Leading to the Procedure:  ? ?ACS: No. ? ?Non-ACS:   ? ?Anginal Classification: CCS III ? ?Anti-ischemic medical therapy: No Therapy ? ?Non-Invasive Test Results: No non-invasive testing performed ? ?Prior CABG: No previous CABG ? ? ? ? ? ?History and Physical Interval Note: ? ?08/30/2021 ?7:42 AM ? ?Danny [name]  has presented today for surgery, with the diagnosis of cad.  The various methods of treatment have been discussed with the patient and family. After consideration of risks, benefits and other options for treatment, the patient has consented to  Procedure(s): ?LEFT HEART CATH AND CORONARY ANGIOGRAPHY (N/A) as a surgical intervention.  The patient's history has been reviewed, patient examined, no change in status, stable for surgery.  I have reviewed the patient's chart and labs.  Questions were answered to the patient's satisfaction.   ? ? ?[name] ? ? ?

## 2021-09-05 ENCOUNTER — Other Ambulatory Visit: Payer: Self-pay | Admitting: Internal Medicine

## 2021-09-05 NOTE — Telephone Encounter (Signed)
Prescription refill request for Eliquis received. ?Indication: Afib  ?Last office visit: 08/24/21 Lanna Poche)  ?Scr: 1.02 (08/24/21) ?Age: 61 ?Weight: 101.6kg ? ?Appropriate dose and refill sent to requested pharmacy.  ?

## 2021-09-10 ENCOUNTER — Other Ambulatory Visit: Payer: Self-pay | Admitting: Endocrinology

## 2021-09-13 ENCOUNTER — Other Ambulatory Visit: Payer: Self-pay

## 2021-09-13 ENCOUNTER — Ambulatory Visit (INDEPENDENT_AMBULATORY_CARE_PROVIDER_SITE_OTHER): Payer: BC Managed Care – PPO | Admitting: Endocrinology

## 2021-09-13 ENCOUNTER — Encounter: Payer: Self-pay | Admitting: Endocrinology

## 2021-09-13 VITALS — BP 142/74 | HR 68 | Ht 71.0 in | Wt 226.6 lb

## 2021-09-13 DIAGNOSIS — E1165 Type 2 diabetes mellitus with hyperglycemia: Secondary | ICD-10-CM | POA: Diagnosis not present

## 2021-09-13 DIAGNOSIS — Z794 Long term (current) use of insulin: Secondary | ICD-10-CM

## 2021-09-13 DIAGNOSIS — E1142 Type 2 diabetes mellitus with diabetic polyneuropathy: Secondary | ICD-10-CM | POA: Diagnosis not present

## 2021-09-13 DIAGNOSIS — E782 Mixed hyperlipidemia: Secondary | ICD-10-CM | POA: Diagnosis not present

## 2021-09-13 LAB — POCT GLYCOSYLATED HEMOGLOBIN (HGB A1C): Hemoglobin A1C: 6.7 % — AB (ref 4.0–5.6)

## 2021-09-13 MED ORDER — DEXCOM G7 SENSOR MISC: 1.0000 | 3 refills | Status: DC

## 2021-09-13 NOTE — Progress Notes (Signed)
? ? ?Patient ID: Danny [name], male   DOB: 04-08-1961, 61 y.o.   MRN: [id number] ? ? ? ?Reason for Appointment: Followup for Type 2 Diabetes ? ?Referring physician: Larose Kells ? ?History of Present Illness:  ?        ?Diagnosis: Type 2 diabetes mellitus, date of diagnosis: 1992      ? ? ?Past history:  ?He has had long-standing diabetes and previously was treated with metformin, Amaryl, Januvia and Actos ?He was taken off metformin because of fear of liver problems.  ?May have been taken taken off Actos because of swelling of his legs but was on 45 mg at that time ?Over the last 2 years at least his A1c has been consistently over 8% ?He was started on insulin in 2011 probably when his A1c was over 9% ?Initially was given Lantus and Humalog and subsequently switched to 70/30 twice a day for unknown reasons ?Has not taken Byetta or Victoza in the past ?He had been on 70/30 insulin initially for a few years ?Previously A1c was 8% or more consistently before starting Invokamet. ?He was switched from Lantus to [place] in 1/17 ? ?Recent history:  ? ?INSULIN regimen is described as:  Tresiba 55 units at 9 pm ? ?Novolog 20 at breakfast, 0-20 units before lunch, 22 ac supper ? ?Oral hypoglycemic drugs the patient is taking are: Xigduo, 10/998, 1 tablet twice a day ? ?His A1c has been usually in the upper normal range, now 6.7 ? ? ?  Current management, blood sugar patterns and problems identified: ?He is now significantly more insulin especially of the NovoLog and he thinks his blood sugars were higher after  intercurrent illnesses. ?Also is taking 5 more units of basal insulin than before ?However he still has some tendency to high postprandial readings at various times with recent 2-week average blood sugar 166 on his sensor ?He also thinks that occasionally the Dexcom sensor may read falsely low and he will calibrate it ?Although he has been taking the Xigduo consistently this was held for a couple of days twice when he  had radiological procedures ?Generally not as active now and not doing any formal exercise also ?No significant weight gain although he is concerned that he is not able to lose weight ?As before he is mostly taking his mealtime insulin based on Premeal blood sugar but usually not taking it at lunch unless he is an active ?Sometimes he has a high sugar midmorning from a snack that is not covered with NovoLog ?      ? ?Side effects from medications have been: None ? ?Compliance with the medical regimen: Good ? ?Glucose monitoring:  done 3-4 times a day         Glucometer: One Touch Verio.     ?  ?Blood Glucose patterns: ? ?HIGHEST blood sugars overall are after breakfast or dinner with average about 190 with some variability ?Overall blood sugars were higher in the first week compared to the last week ?HYPERGLYCEMIC episodes more recently have been sporadically after lunch or dinner but overnight blood sugars are high also the first 3 nights of the data ? ?OVERNIGHT readings are variable with average about 140-160 and better in the second week, no hypoglycemia except as noted in artifacts ?POSTPRANDIAL readings are variable and was generally higher in the first week, and the second week has only sporadic spikes in blood sugars after some of his meals especially dinnertime and rarely late evening possibly from snacks ?  Overall hypoglycemia has not been present except low normal readings around midnight rarely ? ?GMI 7.3 ? ?CGM use % of time 93  ?2-week average/GV 166+/-50  ?Time in range    76    %  ?% Time Above 180 27  ?% Time above 250 6  ?% Time Below 70 1  ? ?Previously: ?  ?PRE-MEAL Fasting Lunch Dinner Bedtime Overall  ?Glucose range:       ?Averages: 149 151 146    ? ?POST-MEAL PC Breakfast PC Lunch PC Dinner  ?Glucose range:     ?Averages: 175 160 167  ? ? ? ?CGM use % of time 89  ?2-week average/GV 142  ?Time in range        85%  ?% Time Above 180 14  ?% Time above 250   ?% Time Below 70 1  ? ? ? ? ?Glycemic  control: ?  ?Lab Results  ?Component Value Date  ? HGBA1C 6.7 (A) 09/13/2021  ? HGBA1C 6.8 (A) 05/10/2021  ? HGBA1C 6.7 (H) 12/31/2020  ? ?Lab Results  ?Component Value Date  ? MICROALBUR <0.7 08/25/2020  ? [place] 40 04/26/2020  ? CREATININE 1.02 08/24/2021  ? ? ?Self-care: ? Meals: 3 meals per day. Breakfast is eggs or cereal usually,  controlling portions      ?Dietician visit: Most recent: years ago.   ? ?             ?Weight history: ? ?Wt Readings from Last 3 Encounters:  ?09/13/21 226 lb 9.6 oz (102.8 kg)  ?08/30/21 224 lb (101.6 kg)  ?08/24/21 228 lb (103.4 kg)  ? ? ?Office Visit on 09/13/2021  ?Component Date Value Ref Range Status  ? Hemoglobin A1C 09/13/2021 6.7 (A)  4.0 - 5.6 % Final  ? ? ?Allergies as of 09/13/2021   ?No Known Allergies ?  ? ?  ?Medication List  ?  ? ?  ? Accurate as of September 13, 2021 11:59 PM. If you have any questions, ask your nurse or doctor.  ?  ?  ? ?  ? ?acetaminophen 650 MG CR tablet ?Commonly known as: TYLENOL ?Take 650 mg by mouth every 8 (eight) hours as needed for pain. ?  ?azelastine 0.1 % nasal spray ?Commonly known as: ASTELIN ?Place 2 sprays into both nostrils 2 (two) times daily. Use in each nostril as directed ?What changed:  ?when to take this ?reasons to take this ?  ?BD Veo Insulin Syringe U/F 31G X 15/64" 0.5 ML Misc ?Generic drug: Insulin Syringe-Needle U-100 ?USE 1 SYRINGE AS DIRECTED 3 TO 4 TIMES DAILY ?  ?Dexcom G6 Sensor Misc ?USE AS DIRECTED TO  MONITOR  BLOOD  SUGAR,  CHANGE  EVERY  10  DAYS ?What changed: Another medication with the same name was added. Make sure you understand how and when to take each. ?Changed by: Elayne Snare, MD ?  ?Dexcom G7 Sensor Misc ?1 Device by Subdermal route as directed. Change sensor every 10 days ?What changed: You were already taking a medication with the same name, and this prescription was added. Make sure you understand how and when to take each. ?Changed by: Elayne Snare, MD ?  ?Dexcom G6 Transmitter Misc ?USE AS DIRECTED ?   ?dicyclomine 10 MG capsule ?Commonly known as: BENTYL ?Take 1 capsule (10 mg total) by mouth 4 (four) times daily -  before meals and at bedtime. ?  ?Eliquis 5 MG Tabs tablet ?Generic drug: apixaban ?TAKE 1 TABLET(5 MG) BY  MOUTH TWICE DAILY ?  ?famotidine 40 MG tablet ?Commonly known as: PEPCID ?Take 40 mg by mouth at bedtime. ?  ?fish oil-omega-3 fatty acids 1000 MG capsule ?Take 2 g by mouth 2 (two) times daily. ?  ?fluticasone 50 MCG/ACT nasal spray ?Commonly known as: FLONASE ?SHAKE LIQUID AND USE 2 SPRAYS IN EACH NOSTRIL DAILY AS NEEDED FOR ALLERGIES OR RHINITIS ?  ?Insulin Pen Needle 32G X 4 MM Misc ?Commonly known as: BD Pen Needle Nano U/F ?Use to give insulin injects 4 times daily. ?  ?loratadine 10 MG tablet ?Commonly known as: CLARITIN ?Take 10 mg by mouth daily. ?  ?multivitamin with minerals tablet ?Take 1 tablet by mouth daily. ?  ?nitroGLYCERIN 0.4 MG SL tablet ?Commonly known as: NITROSTAT ?Place 1 tablet (0.4 mg total) under the tongue every 5 (five) minutes as needed for chest pain. ?  ?NovoLOG FlexPen 100 UNIT/ML FlexPen ?Generic drug: insulin aspart ?INJECT 10 TO 12 UNITS SUBCUTANEOUSLY THREE TIMES DAILY BEFORE MEAL(S) ?  ?OneTouch Delica Plus [id number] Misc ?Use to check blood sugar 4 times per day ?  ?OneTouch Verio Reflect w/Device Kit ?1 ?  ?OneTouch Verio test strip ?Generic drug: glucose blood ?Use as instructed to check blood sugar 4 times per day dx code E11.9 ?  ?Polyethyl Glyc-Propyl Glyc PF 0.4-0.3 % Soln ?Apply 2 drops to eye 3 (three) times daily as needed (for dry eyes). ?  ?pregabalin 75 MG capsule ?Commonly known as: LYRICA ?TAKE 1 CAPSULE BY MOUTH THREE TIMES DAILY ?  ?PROBIOTIC DAILY PO ?Take 1 capsule by mouth daily. ?  ?simvastatin 20 MG tablet ?Commonly known as: ZOCOR ?Take 1 tablet (20 mg total) by mouth at bedtime. ?  ?tamsulosin 0.4 MG Caps capsule ?Commonly known as: FLOMAX ?Take 1 capsule (0.4 mg total) by mouth daily. ?  ?Tyler Aas FlexTouch 100 UNIT/ML FlexTouch  Pen ?Generic drug: insulin degludec ?INJECT 42 UNITS SUBCUTANEOUSLY ONCE DAILY ?  ?Xigduo XR 10-998 MG Tb24 ?Generic drug: Dapagliflozin-metFORMIN HCl ER ?Take 1 tablet by mouth twice daily ?  ? ?  ? ? ?[place]

## 2021-09-13 NOTE — Patient Instructions (Signed)
Start OZEMPIC injections by dialing 0.25 mg on the pen as shown once weekly on the same day of the week.   You may inject in the sides of the stomach, outer thigh or arm as indicated in the brochure given. If you have any difficulties using the pen see the video at [email or web address]  You will feel fullness of the stomach with starting the medication and should try to keep the portions at meals small.  You may experience nausea in the first few days which usually gets better over time    After 4 weeks increase the dose to 0.5 mg weekly  If you have any questions or persistent side effects please call the office   You may also talk to a nurse educator with Novo Nordisk at [phone] Useful website: [email or web address]     

## 2021-09-14 MED ORDER — OZEMPIC (0.25 OR 0.5 MG/DOSE) 2 MG/3ML ~~LOC~~ SOPN: 0.5000 mg | PEN_INJECTOR | SUBCUTANEOUS | 1 refills | Status: DC

## 2021-09-19 NOTE — Progress Notes (Signed)
? ? ? ? ?HPI:FU atrial fibrillation.he previously failed to Multaq.  Patient had ablation of his atrial fibrillation in September 2015 by Dr. Rayann Heman.  Patient seen recently with complaints of near syncope and chest discomfort.  Monitor February 2023 showed no atrial fibrillation and rare supraventricular and ventricular ectopy.  Cardiac CTA February 2023 showed calcium score 2233 which was 99th percentile.  Echocardiogram September 2015 showed normal LV function and no significant valvular disease.  Carotid Dopplers February 2023 showed no significant stenosis.  Echocardiogram February 2023 showed normal LV function.  Cardiac catheterization March 2023 showed 40% mid RCA, 50% mid LAD and mildly elevated left ventricular end-diastolic pressure; ejection fraction 55 to 65%.  Since he was last seen, he denies chest pain, palpitations or syncope.  Occasional minimal pedal edema.  He has dyspnea with more vigorous activities. ? ?Current Outpatient Medications  ?Medication Sig Dispense Refill  ? acetaminophen (TYLENOL) 650 MG CR tablet Take 650 mg by mouth every 8 (eight) hours as needed for pain.    ? apixaban (ELIQUIS) 5 MG TABS tablet TAKE 1 TABLET(5 MG) BY MOUTH TWICE DAILY 60 tablet 5  ? azelastine (ASTELIN) 0.1 % nasal spray Place 2 sprays into both nostrils 2 (two) times daily. Use in each nostril as directed (Patient taking differently: Place 2 sprays into both nostrils 2 (two) times daily as needed for allergies. Use in each nostril as directed) 30 mL 5  ? BD VEO INSULIN SYRINGE U/F 31G X 15/64" 0.5 ML MISC USE 1 SYRINGE AS DIRECTED 3 TO 4 TIMES DAILY 100 each 0  ? Blood Glucose Monitoring Suppl (ONETOUCH VERIO REFLECT) w/Device KIT 1 1 kit 0  ? Continuous Blood Gluc Sensor (DEXCOM G6 SENSOR) MISC USE AS DIRECTED TO  MONITOR  BLOOD  SUGAR,  CHANGE  EVERY  10  DAYS 3 each 3  ? Continuous Blood Gluc Sensor (DEXCOM G7 SENSOR) MISC 1 Device by Subdermal route as directed. Change sensor every 10 days 3 each 3  ?  dicyclomine (BENTYL) 10 MG capsule Take 1 capsule (10 mg total) by mouth 4 (four) times daily -  before meals and at bedtime. 120 capsule 11  ? fish oil-omega-3 fatty acids 1000 MG capsule Take 2 g by mouth 2 (two) times daily.    ? fluticasone (FLONASE) 50 MCG/ACT nasal spray SHAKE LIQUID AND USE 2 SPRAYS IN EACH NOSTRIL DAILY AS NEEDED FOR ALLERGIES OR RHINITIS 16 g 5  ? glucose blood (ONETOUCH VERIO) test strip Use as instructed to check blood sugar 4 times per day dx code E11.9 125 each 3  ? Lancets (ONETOUCH DELICA PLUS [id number]) MISC Use to check blood sugar 4 times per day 200 each 2  ? loratadine (CLARITIN) 10 MG tablet Take 10 mg by mouth daily.    ? Multiple Vitamins-Minerals (MULTIVITAMIN WITH MINERALS) tablet Take 1 tablet by mouth daily.    ? nitroGLYCERIN (NITROSTAT) 0.4 MG SL tablet Place 1 tablet (0.4 mg total) under the tongue every 5 (five) minutes as needed for chest pain. 25 tablet 3  ? NOVOLOG FLEXPEN 100 UNIT/ML FlexPen INJECT 10 TO 12 UNITS SUBCUTANEOUSLY THREE TIMES DAILY BEFORE MEAL(S) 15 mL 2  ? Polyethyl Glyc-Propyl Glyc PF 0.4-0.3 % SOLN Apply 2 drops to eye 3 (three) times daily as needed (for dry eyes).    ? pregabalin (LYRICA) 75 MG capsule TAKE 1 CAPSULE BY MOUTH THREE TIMES DAILY 90 capsule 5  ? Probiotic Product (PROBIOTIC DAILY PO) Take 1 capsule by mouth  daily.    ? Semaglutide,0.25 or 0.5MG/DOS, (OZEMPIC, 0.25 OR 0.5 MG/DOSE,) 2 MG/3ML SOPN Inject 0.5 mg into the skin once a week. 3 mL 1  ? simvastatin (ZOCOR) 20 MG tablet Take 1 tablet (20 mg total) by mouth at bedtime. 90 tablet 3  ? tamsulosin (FLOMAX) 0.4 MG CAPS capsule Take 1 capsule (0.4 mg total) by mouth daily. 90 capsule 3  ? TRESIBA FLEXTOUCH 100 UNIT/ML FlexTouch Pen INJECT 42 UNITS SUBCUTANEOUSLY ONCE DAILY 15 mL 2  ? XIGDUO XR 10-998 MG TB24 Take 1 tablet by mouth twice daily 180 tablet 2  ? ?No current facility-administered medications for this visit.  ? ? ? ?Past Medical History:  ?Diagnosis Date  ? Allergic  rhinitis   ? Benign prostatic hypertrophy   ? s/p TUNA procedure aprx 2006,  ? Carpal tunnel syndrome 06/17/2009  ? Diabetes mellitus   ? Type II dx 1999  ? DM type 2 with diabetic peripheral neuropathy ([place]) 10/15/2014  ? ED (erectile dysfunction)   ? After TUNA  ? Elevated LFTs 2010  ? (-) chronic hep  panel  ? History of cardiovascular stress test 8/11  ? Negative  ? Hyperlipidemia   ? HYPERSOMNIA UNSPECIFIED 12/11/2007  ? Hypertension   ? Obesity   ? PAF (paroxysmal atrial fibrillation) ([place]) 12/21/2009  ? a. Failed Multaq. b. Prev on flecainide. c. s/p afib ablation 2015.  ? ? ?Past Surgical History:  ?Procedure Laterality Date  ? ABLATION  03/24/2014  ? PVI by Dr Rayann Heman  ? ATRIAL FIBRILLATION ABLATION N/A 03/24/2014  ? Procedure: ATRIAL FIBRILLATION ABLATION;  Surgeon: Coralyn Mark, MD;  Location: [facility];  Service: Cardiovascular;  Laterality: N/A;  ? COLONOSCOPY  11/25/2012  ? Mild sigmoid diverticulosis, small internal hemorrhoids, otherwise normal colonoscopy  ? CYSTOSCOPY  04/26/2008  ? neg  @ urology, was rx Flomax  ? LEFT HEART CATH AND CORONARY ANGIOGRAPHY N/A 08/30/2021  ? Procedure: LEFT HEART CATH AND CORONARY ANGIOGRAPHY;  Surgeon: Troy Sine, MD;  Location: [facility];  Service: Cardiovascular;  Laterality: N/A;  ? PROSTATE SURGERY    ? TUNA  ? TEE WITHOUT CARDIOVERSION N/A 03/23/2014  ? Procedure: TRANSESOPHAGEAL ECHOCARDIOGRAM (TEE);  Surgeon: Fay Records, MD;  Location: [facility];  Service: Cardiovascular;  Laterality: N/A;  ? VASECTOMY    ? several years ago  ? ? ?Social History  ? ?Socioeconomic History  ? Marital status: Married  ?  Spouse name: Not on file  ? Number of children: 3  ? Years of education: GED  ? Highest education level: Not on file  ?Occupational History  ? 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  Employer: [employer]  ?  Comment: works for [employer] (state)  ?Tobacco Use  ? Smoking status: Former  ?  Types: Cigarettes  ?  Quit date: 06/26/1985   ?  Years since quitting: 36.2  ? Smokeless tobacco: Never  ?Vaping Use  ? Vaping Use: Never used  ?Substance and Sexual Activity  ? Alcohol use: No  ?  Comment:    ? Drug use: No  ? Sexual activity: Not on file  ?Other Topics Concern  ? Not on file  ?Social History Narrative  ? Lives with wife in a one story  ? Right handed   ? One story home  ? 3 children  ?   ?   ? ?Social Determinants of Health  ? ?Financial Resource Strain: Not  on file  ?Food Insecurity: Not on file  ?Transportation Needs: Not on file  ?Physical Activity: Not on file  ?Stress: Not on file  ?Social Connections: Not on file  ?Intimate Partner Violence: Not on file  ? ? ?Family History  ?Problem Relation Age of Onset  ? Heart disease Mother   ? Diabetes Mother   ? Hypertension Mother   ? Congestive Heart Failure Mother   ? Heart attack Mother   ? Hypertension Father   ? Colon cancer Neg Hx   ? Prostate cancer Neg Hx   ? ? ?ROS: no fevers or chills, productive cough, hemoptysis, dysphasia, odynophagia, melena, hematochezia, dysuria, hematuria, rash, seizure activity, orthopnea, PND, pedal edema, claudication. Remaining systems are negative. ? ?Physical Exam: ?Well-developed well-nourished in no acute distress.  ?Skin is warm and dry.  ?HEENT is normal.  ?Neck is supple.  ?Chest is clear to auscultation with normal expansion.  ?Cardiovascular exam is regular rate and rhythm.  ?Abdominal exam nontender or distended. No masses palpated. ?Extremities show no edema. ?neuro grossly intact ? ?A/P ? ?1 paroxysmal atrial fibrillation-patient remains in sinus rhythm on examination and has history of ablation.  Continue apixaban at present dose. ? ?2 coronary artery disease-nonobstructive on recent catheterization.  Plan to continue medical therapy.  He is not on aspirin given need for apixaban.  Continue statin. ? ?3 hypertension-blood pressure is elevated.  Add Avapro 150 mg daily.  Check potassium and renal function 1 week.  Follow blood pressure and  titrate medications as needed. ? ?4 hyperlipidemia-given documented coronary disease we will discontinue Zocor and treat with Crestor 40 mg daily.  If he develops myalgias we will need to consider Zetia, Repatha,

## 2021-09-21 ENCOUNTER — Ambulatory Visit: Payer: BC Managed Care – PPO | Admitting: Cardiology

## 2021-09-27 ENCOUNTER — Encounter: Payer: Self-pay | Admitting: Cardiology

## 2021-09-27 ENCOUNTER — Ambulatory Visit (INDEPENDENT_AMBULATORY_CARE_PROVIDER_SITE_OTHER): Payer: BC Managed Care – PPO | Admitting: Cardiology

## 2021-09-27 VITALS — BP 140/70 | HR 92 | Resp 20 | Ht 71.0 in | Wt 226.2 lb

## 2021-09-27 DIAGNOSIS — Z79899 Other long term (current) drug therapy: Secondary | ICD-10-CM | POA: Diagnosis not present

## 2021-09-27 DIAGNOSIS — I480 Paroxysmal atrial fibrillation: Secondary | ICD-10-CM | POA: Diagnosis not present

## 2021-09-27 DIAGNOSIS — I10 Essential (primary) hypertension: Secondary | ICD-10-CM | POA: Diagnosis not present

## 2021-09-27 DIAGNOSIS — E7800 Pure hypercholesterolemia, unspecified: Secondary | ICD-10-CM

## 2021-09-27 DIAGNOSIS — E785 Hyperlipidemia, unspecified: Secondary | ICD-10-CM | POA: Diagnosis not present

## 2021-09-27 MED ORDER — IRBESARTAN 150 MG PO TABS: 150.0000 mg | ORAL_TABLET | Freq: Every day | ORAL | 3 refills | Status: DC

## 2021-09-27 MED ORDER — ROSUVASTATIN CALCIUM 40 MG PO TABS: 40.0000 mg | ORAL_TABLET | Freq: Every day | ORAL | 3 refills | Status: DC

## 2021-09-27 NOTE — Patient Instructions (Signed)
Medication Instructions:  ? ?STOP SIMVASTATIN ? ?START ROSUVASTATIN 40 MG ONCE DAILY ? ?START IRBESARTAN 150 MG ONCE DAILY ? ?*If you need a refill on your cardiac medications before your next appointment, please call your pharmacy* ? ? ?Lab Work: ? ?Your physician recommends that you return for lab work in: ONE WEEK-DO NOT NEED TO FAST ? ?Your physician recommends that you return for lab work in: 8 [facility] ? ?If you have labs (blood work) drawn today and your tests are completely normal, you will receive your results only by: ?MyChart Message (if you have MyChart) OR ?A paper copy in the mail ?If you have any lab test that is abnormal or we need to change your treatment, we will call you to review the results. ? ? ?Follow-Up: ?At [facility], you and your health needs are our priority.  As part of our continuing mission to provide you with exceptional heart care, we have created designated Provider Care Teams.  These Care Teams include your primary Cardiologist (physician) and Advanced Practice Providers (APPs -  Physician Assistants and Nurse Practitioners) who all work together to provide you with the care you need, when you need it. ? ?We recommend signing up for the patient portal called "MyChart".  Sign up information is provided on this After Visit Summary.  MyChart is used to connect with patients for Virtual Visits (Telemedicine).  Patients are able to view lab/test results, encounter notes, upcoming appointments, etc.  Non-urgent messages can be sent to your provider as well.   ?To learn more about what you can do with MyChart, go to [email or web address].   ? ?Your next appointment:   ?6 month(s) ? ?The format for your next appointment:   ?In Person ? ?Provider:   ?Edd Fabian, FNP or Marjie Skiff, PA-C    Then, Olga Millers, MD will plan to see you again in 12 month(s).  ? ? ? ?

## 2021-10-04 ENCOUNTER — Encounter: Payer: Self-pay | Admitting: Endocrinology

## 2021-10-04 MED ORDER — ONETOUCH VERIO REFLECT W/DEVICE KIT: PACK | 0 refills | Status: AC

## 2021-10-04 MED ORDER — ONETOUCH VERIO VI STRP: ORAL_STRIP | 3 refills | Status: DC

## 2021-10-04 MED ORDER — ONETOUCH DELICA PLUS LANCET33G MISC: 2 refills | Status: AC

## 2021-10-05 ENCOUNTER — Encounter: Payer: Self-pay | Admitting: Endocrinology

## 2021-10-05 LAB — BASIC METABOLIC PANEL
BUN/Creatinine Ratio: 14 (ref 10–24)
BUN: 14 mg/dL (ref 8–27)
CO2: 23 mmol/L (ref 20–29)
Calcium: 9.6 mg/dL (ref 8.6–10.2)
Chloride: 104 mmol/L (ref 96–106)
Creatinine, Ser: 1.01 mg/dL (ref 0.76–1.27)
Glucose: 118 mg/dL — ABNORMAL HIGH (ref 70–99)
Potassium: 4.4 mmol/L (ref 3.5–5.2)
Sodium: 142 mmol/L (ref 134–144)
eGFR: 84 mL/min/{1.73_m2} (ref >59)

## 2021-10-10 ENCOUNTER — Other Ambulatory Visit: Payer: Self-pay | Admitting: Endocrinology

## 2021-10-10 MED ORDER — BAQSIMI ONE PACK 3 MG/DOSE NA POWD: NASAL | 1 refills | Status: AC

## 2021-10-12 ENCOUNTER — Telehealth: Payer: Self-pay

## 2021-10-12 DIAGNOSIS — E1142 Type 2 diabetes mellitus with diabetic polyneuropathy: Secondary | ICD-10-CM

## 2021-10-12 DIAGNOSIS — E1165 Type 2 diabetes mellitus with hyperglycemia: Secondary | ICD-10-CM

## 2021-10-12 NOTE — Telephone Encounter (Signed)
Patient wife called in about ozempic. States ozempic is helping. He did 1 week of .5mg  and wants Rx sent in. ?

## 2021-10-13 MED ORDER — OZEMPIC (0.25 OR 0.5 MG/DOSE) 2 MG/3ML ~~LOC~~ SOPN: 0.5000 mg | PEN_INJECTOR | SUBCUTANEOUS | 1 refills | Status: DC

## 2021-11-15 ENCOUNTER — Ambulatory Visit (HOSPITAL_COMMUNITY)
Admission: RE | Admit: 2021-11-15 | Discharge: 2021-11-15 | Disposition: A | Discharge status: Home / Self Care | Payer: BC Managed Care – PPO | Source: Ambulatory Visit | Attending: Physician Assistant | Admitting: Physician Assistant

## 2021-11-15 ENCOUNTER — Ambulatory Visit (INDEPENDENT_AMBULATORY_CARE_PROVIDER_SITE_OTHER): Payer: BC Managed Care – PPO | Admitting: Endocrinology

## 2021-11-15 ENCOUNTER — Encounter (HOSPITAL_COMMUNITY): Payer: Self-pay

## 2021-11-15 ENCOUNTER — Encounter (HOSPITAL_COMMUNITY): Payer: Self-pay | Admitting: Physician Assistant

## 2021-11-15 ENCOUNTER — Encounter: Payer: Self-pay | Admitting: Endocrinology

## 2021-11-15 VITALS — BP 108/68 | HR 69 | Ht 71.0 in | Wt 215.2 lb

## 2021-11-15 VITALS — BP 118/70 | HR 86 | Ht 71.0 in | Wt 215.2 lb

## 2021-11-15 DIAGNOSIS — I2510 Atherosclerotic heart disease of native coronary artery without angina pectoris: Secondary | ICD-10-CM | POA: Diagnosis not present

## 2021-11-15 DIAGNOSIS — I480 Paroxysmal atrial fibrillation: Secondary | ICD-10-CM | POA: Diagnosis not present

## 2021-11-15 DIAGNOSIS — E785 Hyperlipidemia, unspecified: Secondary | ICD-10-CM | POA: Insufficient documentation

## 2021-11-15 DIAGNOSIS — E1142 Type 2 diabetes mellitus with diabetic polyneuropathy: Secondary | ICD-10-CM | POA: Diagnosis not present

## 2021-11-15 DIAGNOSIS — D6869 Other thrombophilia: Secondary | ICD-10-CM

## 2021-11-15 DIAGNOSIS — Z7901 Long term (current) use of anticoagulants: Secondary | ICD-10-CM | POA: Diagnosis not present

## 2021-11-15 DIAGNOSIS — E119 Type 2 diabetes mellitus without complications: Secondary | ICD-10-CM | POA: Insufficient documentation

## 2021-11-15 DIAGNOSIS — Z794 Long term (current) use of insulin: Secondary | ICD-10-CM

## 2021-11-15 DIAGNOSIS — E1165 Type 2 diabetes mellitus with hyperglycemia: Secondary | ICD-10-CM

## 2021-11-15 DIAGNOSIS — I10 Essential (primary) hypertension: Secondary | ICD-10-CM | POA: Insufficient documentation

## 2021-11-15 DIAGNOSIS — E669 Obesity, unspecified: Secondary | ICD-10-CM | POA: Insufficient documentation

## 2021-11-15 DIAGNOSIS — Z6830 Body mass index (BMI) 30.0-30.9, adult: Secondary | ICD-10-CM | POA: Diagnosis not present

## 2021-11-15 LAB — POCT GLYCOSYLATED HEMOGLOBIN (HGB A1C): Hemoglobin A1C: 6.3 % — AB (ref 4.0–5.6)

## 2021-11-15 NOTE — Patient Instructions (Signed)
Novolog with am snack and any hi carb meals

## 2021-11-15 NOTE — Progress Notes (Signed)
Patient ID: Danny [name], male   DOB: 10/24/1960, 61 y.o.   MRN: [id number]    Reason for Appointment: Followup for Type 2 Diabetes  Referring physician: Drue Novel  History of Present Illness:          Diagnosis: Type 2 diabetes mellitus, date of diagnosis: 1992        Past history:  He has had long-standing diabetes and previously was treated with metformin, Amaryl, Januvia and Actos He was taken off metformin because of fear of liver problems.  May have been taken taken off Actos because of swelling of his legs but was on 45 mg at that time Over the last 2 years at least his A1c has been consistently over 8% He was started on insulin in 2011 probably when his A1c was over 9% Initially was given Lantus and Humalog and subsequently switched to 70/30 twice a day for unknown reasons Has not taken Byetta or Victoza in the past He had been on 70/30 insulin initially for a few years Previously A1c was 8% or more consistently before starting Invokamet. He was switched from Lantus to [place] in 1/17  Recent history:   INSULIN regimen is described as:  Tresiba 50 units at 9 pm  Novolog as needed  Oral hypoglycemic drugs the patient is taking are: Xigduo, 10/998, 1 tablet twice a day, Ozempic 0.5 mg weekly  His A1c has been usually in the upper normal range, now 6.3     Current management, blood sugar patterns and problems identified: He was started on OZEMPIC in March since she was requiring much more insulin especially mealtime doses  With increasing the dose to 0.5 mg weekly he has been able to reduce and even stop his NovoLog  Although he thinks he does not need any NovoLog if periodically will have blood sugars over 180 or 200 depending on his diet Also he is having significantly decreased appetite and sometimes skipping meals  Only if he has regular soft drinks his blood sugar may go up significantly He is generally trying to leave very active Has lost 10 pounds For  breakfast he is frequently eating a granola bar type of snack and blood sugars are going up since he did not take any coverage with average after breakfast 168 He is now using the G7 sensor and he thinks this is better and likely more accurate        Side effects from medications have been: None  Compliance with the medical regimen: Good  Glucose monitoring:  done 3-4 times a day         Glucometer: One Touch Verio.       Blood Glucose patterns:  Overall blood sugars are excellent with tendency to average in the high target range mostly after breakfast and occasionally after lunch, overnight blood sugars also consistently controlled  GMI 6.5  CGM use % of time 93  2-week average/GV 134, was 166  Time in range 91 Compared to 76    %  % Time Above 180 8     % Time Below 70 0      Glycemic control:   Lab Results  Component Value Date   HGBA1C 6.3 (A) 11/15/2021   HGBA1C 6.7 (A) 09/13/2021   HGBA1C 6.8 (A) 05/10/2021   Lab Results  Component Value Date   MICROALBUR <0.7 08/25/2020   LDLCALC 40 04/26/2020   CREATININE 1.01 10/04/2021    Self-care:  Meals: 3 meals per day. Breakfast is eggs or cereal usually,  controlling portions      Dietician visit: Most recent: years ago.                 Weight history:  Wt Readings from Last 3 Encounters:  11/15/21 215 lb 3.2 oz (97.6 kg)  11/15/21 215 lb 3.2 oz (97.6 kg)  09/27/21 226 lb 3.2 oz (102.6 kg)    Office Visit on 11/15/2021  Component Date Value Ref Range Status   Hemoglobin A1C 11/15/2021 6.3 (A)  4.0 - 5.6 % Final    Allergies as of 11/15/2021   No Known Allergies      Medication List        Accurate as of Nov 15, 2021 10:24 AM. If you have any questions, ask your nurse or doctor.          acetaminophen 650 MG CR tablet Commonly known as: TYLENOL Take 650 mg by mouth every 8 (eight) hours as needed for pain.   azelastine 0.1 % nasal spray Commonly known as: ASTELIN Place 2 sprays into both  nostrils 2 (two) times daily. Use in each nostril as directed   Baqsimi One Pack 3 MG/DOSE Powd Generic drug: Glucagon To be sprayed in nostril as needed for severe hypoglycemia   BD Veo Insulin Syringe U/F 31G X 15/64" 0.5 ML Misc Generic drug: Insulin Syringe-Needle U-100 USE 1 SYRINGE AS DIRECTED 3 TO 4 TIMES DAILY   Dexcom G6 Sensor Misc USE AS DIRECTED TO  MONITOR  BLOOD  SUGAR,  CHANGE  EVERY  10  DAYS   Dexcom G7 Sensor Misc 1 Device by Subdermal route as directed. Change sensor every 10 days   dicyclomine 10 MG capsule Commonly known as: BENTYL Take 1 capsule (10 mg total) by mouth 4 (four) times daily -  before meals and at bedtime.   Eliquis 5 MG Tabs tablet Generic drug: apixaban TAKE 1 TABLET(5 MG) BY MOUTH TWICE DAILY   fish oil-omega-3 fatty acids 1000 MG capsule Take 2 g by mouth 2 (two) times daily.   fluticasone 50 MCG/ACT nasal spray Commonly known as: FLONASE SHAKE LIQUID AND USE 2 SPRAYS IN EACH NOSTRIL DAILY AS NEEDED FOR ALLERGIES OR RHINITIS   irbesartan 150 MG tablet Commonly known as: AVAPRO Take 1 tablet (150 mg total) by mouth daily.   loratadine 10 MG tablet Commonly known as: CLARITIN Take 10 mg by mouth daily.   multivitamin with minerals tablet Take 1 tablet by mouth daily.   nitroGLYCERIN 0.4 MG SL tablet Commonly known as: NITROSTAT Place 1 tablet (0.4 mg total) under the tongue every 5 (five) minutes as needed for chest pain.   NovoLOG FlexPen 100 UNIT/ML FlexPen Generic drug: insulin aspart INJECT 10 TO 12 UNITS SUBCUTANEOUSLY THREE TIMES DAILY BEFORE MEAL(S) What changed: See the new instructions.   OneTouch Delica Plus Lancet33G Misc Use to check blood sugar 4 times per day   OneTouch Verio Reflect w/Device Kit 1   OneTouch Verio test strip Generic drug: glucose blood Use as instructed to check blood sugar 4 times per day dx code E11.9   Ozempic (0.25 or 0.5 MG/DOSE) 2 MG/3ML Sopn Generic drug: Semaglutide(0.25 or  0.5MG /DOS) Inject 0.5 mg into the skin once a week.   Polyethyl Glyc-Propyl Glyc PF 0.4-0.3 % Soln Apply 2 drops to eye 3 (three) times daily as needed (for dry eyes).   pregabalin 75 MG capsule Commonly known as: LYRICA TAKE 1 CAPSULE  BY MOUTH THREE TIMES DAILY   PROBIOTIC DAILY PO Take 1 capsule by mouth daily.   rosuvastatin 40 MG tablet Commonly known as: CRESTOR Take 1 tablet (40 mg total) by mouth daily.   tamsulosin 0.4 MG Caps capsule Commonly known as: FLOMAX Take 1 capsule (0.4 mg total) by mouth daily.   Evaristo Bury FlexTouch 100 UNIT/ML FlexTouch Pen Generic drug: insulin degludec INJECT 42 UNITS SUBCUTANEOUSLY ONCE DAILY What changed: See the new instructions.   Xigduo XR 10-998 MG Tb24 Generic drug: Dapagliflozin-metFORMIN HCl ER Take 1 tablet by mouth twice daily        Allergies:  No Known Allergies  Past Medical History:  Diagnosis Date   Allergic rhinitis    Benign prostatic hypertrophy    s/p TUNA procedure aprx 2006,   Carpal tunnel syndrome 06/17/2009   Diabetes mellitus    Type II dx 1999   DM type 2 with diabetic peripheral neuropathy ([facility]) 10/15/2014   ED (erectile dysfunction)    After TUNA   Elevated LFTs 2010   (-) chronic hep  panel   History of cardiovascular stress test 8/11   Negative   Hyperlipidemia    HYPERSOMNIA UNSPECIFIED 12/11/2007   Hypertension    Obesity    PAF (paroxysmal atrial fibrillation) ([facility]) 12/21/2009   a. Failed Multaq. b. Prev on flecainide. c. s/p afib ablation 2015.    Past Surgical History:  Procedure Laterality Date   ABLATION  03/24/2014   PVI by Dr Johney Frame   ATRIAL FIBRILLATION ABLATION N/A 03/24/2014   Procedure: ATRIAL FIBRILLATION ABLATION;  Surgeon: Gardiner Rhyme, MD;  Location: [facility] CATH LAB;  Service: Cardiovascular;  Laterality: N/A;   COLONOSCOPY  11/25/2012   Mild sigmoid diverticulosis, small internal hemorrhoids, otherwise normal colonoscopy   CYSTOSCOPY  04/26/2008   neg  @ urology, was  rx Flomax   LEFT HEART CATH AND CORONARY ANGIOGRAPHY N/A 08/30/2021   Procedure: LEFT HEART CATH AND CORONARY ANGIOGRAPHY;  Surgeon: Lennette Bihari, MD;  Location: [facility] INV[facility]IVE CV LAB;  Service: Cardiovascular;  Laterality: N/A;   PROSTATE SURGERY     TUNA   TEE WITHOUT CARDIOVERSION N/A 03/23/2014   Procedure: TRANSESOPHAGEAL ECHOCARDIOGRAM (TEE);  Surgeon: Pricilla Riffle, MD;  Location: [facility] ENDOSCOPY;  Service: Cardiovascular;  Laterality: N/A;   VASECTOMY     several years ago    Family History  Problem Relation Age of Onset   Heart disease Mother    Diabetes Mother    Hypertension Mother    Congestive Heart Failure Mother    Heart attack Mother    Hypertension Father    Colon cancer Neg Hx    Prostate cancer Neg Hx     Social History:  reports that he quit smoking about 36 years ago. His smoking use included cigarettes. He has never used smokeless tobacco. He reports that he does not drink alcohol and does not use drugs.    Review of Systems       Lipids: He has been on Crestor 40 mg instead of simvastatin which he thinks was causing muscle   On bid fish oil for high triglycerides  Labs as below:      Lab Results  Component Value Date   CHOL 114 03/29/2021   HDL 34.20 (L) 03/29/2021   LDLCALC 40 04/26/2020   LDLDIRECT 62.0 03/29/2021   TRIG 239.0 (H) 03/29/2021   CHOLHDL 3 03/29/2021  Hypertension?  His cardiologist has started him on Avapro 150 mg daily for high normal BP No lightheadedness recently  BP Readings from Last 3 Encounters:  11/15/21 108/68  11/15/21 118/70  09/27/21 140/70      Neuropathy: Has longstanding history of Numbness, pins-and-needle sensation and stinging in feet and lower legs He has been followed by neurologist, taking 75 mg, 3 daily of Lyrica Symptoms are mostly controlled  No sensory loss on exam     LABS:  Office Visit on 11/15/2021  Component Date Value Ref Range Status   Hemoglobin A1C 11/15/2021 6.3 (A)   4.0 - 5.6 % Final    Physical Examination:  BP 118/70   Pulse 86   Ht 5\' 11"  (1.803 m)   Wt 215 lb 3.2 oz (97.6 kg)   SpO2 96%   BMI 30.01 kg/m    ASSESSMENT/PLAN:    Diabetes type 2 on insulin  See history of present illness for detailed discussion of current diabetes management, blood sugar patterns and problems identified  His A1c is improved at 6.3  He is on basal bolus insulin with Ozempic and Xigduo  With adding Ozempic his blood sugars are very significantly better He is also able to minimize the use of NovoLog With blood sugar patterns were analyzed with the Dexcom sensor download  Plan:  Since he has significant decrease in appetite with only 0.5 mg Ozempic he will continue same dose Discussed however that with any high carbohydrate meals and breakfast bars he will need to take small amounts of NovoLog likely between 4 to 8 units Discussed blood sugar targets after meals Only if his overnight blood sugars are below 80 he will start reducing Tresiba also by 5 units  ?  Hypertension: He has been on Avapro although not clear if he does have hypertension He will contact his cardiologist if he starts getting lightheaded or blood pressure is low normal  Patient Instructions  Novolog with am snack and any hi carb meals      Lakara Weiland 11/15/2021, 10:24 AM   Note: This office note was prepared with Dragon voice recognition system technology. Any transcriptional errors that result from this process are unintentional. .

## 2021-11-15 NOTE — Progress Notes (Signed)
Primary Care Physician: Colon Branch, MD Primary Cardiologist: Dr Stanford Breed Primary Electrophysiologist: Dr Rayann Heman Referring Physician: Dr Rayann Heman   Danny [name] is a 61 y.o. male with a history of DM, CAD, HTN, HLD, atrial fibrillation who presents for follow up in the [facility]. Patient is on Eliquis for a CHADS2VASC score of 2. Patient had previously been on Multaq and flecainide and had an afib ablation in 2015. Patient seen 07/2021 with complaints of near syncope and chest discomfort.  Cardiac monitor February 2023 showed no atrial fibrillation and rare supraventricular and ventricular ectopy.  Cardiac CTA February 2023 showed calcium score 2233 which was 99th percentile.  Echocardiogram September 2015 showed normal LV function and no significant valvular disease.  Carotid Dopplers February 2023 showed no significant stenosis.  Echocardiogram February 2023 showed normal LV function.  Cardiac catheterization March 2023 showed 40% mid RCA, 50% mid LAD and mildly elevated left ventricular end-diastolic pressure; ejection fraction 55 to 65%.  On follow up today, patient reports that he has not noted any afib since his last visit. He has a smart watch which has shown only SR. No bleeding issues on anticoagulation.    Today, he denies symptoms of palpitations, chest pain, shortness of breath, orthopnea, PND, lower extremity edema, dizziness, presyncope, syncope, snoring, daytime somnolence, bleeding, or neurologic sequela. The patient is tolerating medications without difficulties and is otherwise without complaint today.    Atrial Fibrillation Risk Factors:  he does not have symptoms or diagnosis of sleep apnea. he does not have a history of rheumatic fever.   he has a BMI of Body mass index is 30.01 kg/m.Marland Kitchen Filed Weights   11/15/21 1007  Weight: 97.6 kg     Family History  Problem Relation Age of Onset   Heart disease Mother    Diabetes Mother     Hypertension Mother    Congestive Heart Failure Mother    Heart attack Mother    Hypertension Father    Colon cancer Neg Hx    Prostate cancer Neg Hx      Atrial Fibrillation Management history:  Previous antiarrhythmic drugs: Multaq, flecainide Previous cardioversions: none Previous ablations: 2015 CHADS2VASC score: 3 Anticoagulation history: Eliquis   Past Medical History:  Diagnosis Date   Allergic rhinitis    Benign prostatic hypertrophy    s/p TUNA procedure aprx 2006,   Carpal tunnel syndrome 06/17/2009   Diabetes mellitus    Type II dx 1999   DM type 2 with diabetic peripheral neuropathy ([place]) 10/15/2014   ED (erectile dysfunction)    After TUNA   Elevated LFTs 2010   (-) chronic hep  panel   History of cardiovascular stress test 8/11   Negative   Hyperlipidemia    HYPERSOMNIA UNSPECIFIED 12/11/2007   Hypertension    Obesity    PAF (paroxysmal atrial fibrillation) ([place]) 12/21/2009   a. Failed Multaq. b. Prev on flecainide. c. s/p afib ablation 2015.   Past Surgical History:  Procedure Laterality Date   ABLATION  03/24/2014   PVI by Dr Rayann Heman   ATRIAL FIBRILLATION ABLATION N/A 03/24/2014   Procedure: ATRIAL FIBRILLATION ABLATION;  Surgeon: Coralyn Mark, MD;  Location: [facility] CATH LAB;  Service: Cardiovascular;  Laterality: N/A;   COLONOSCOPY  11/25/2012   Mild sigmoid diverticulosis, small internal hemorrhoids, otherwise normal colonoscopy   CYSTOSCOPY  04/26/2008   neg  @ urology, was rx Flomax   LEFT HEART CATH AND CORONARY ANGIOGRAPHY N/A 08/30/2021  Procedure: LEFT HEART CATH AND CORONARY ANGIOGRAPHY;  Surgeon: Troy Sine, MD;  Location: [facility] CV LAB;  Service: Cardiovascular;  Laterality: N/A;   PROSTATE SURGERY     TUNA   TEE WITHOUT CARDIOVERSION N/A 03/23/2014   Procedure: TRANSESOPHAGEAL ECHOCARDIOGRAM (TEE);  Surgeon: Fay Records, MD;  Location: [facility] ENDOSCOPY;  Service: Cardiovascular;  Laterality: N/A;   VASECTOMY     several years ago     Current Outpatient Medications  Medication Sig Dispense Refill   acetaminophen (TYLENOL) 650 MG CR tablet Take 650 mg by mouth every 8 (eight) hours as needed for pain.     apixaban (ELIQUIS) 5 MG TABS tablet TAKE 1 TABLET(5 MG) BY MOUTH TWICE DAILY 60 tablet 5   azelastine (ASTELIN) 0.1 % nasal spray Place 2 sprays into both nostrils 2 (two) times daily. Use in each nostril as directed 30 mL 5   BD VEO INSULIN SYRINGE U/F 31G X 15/64" 0.5 ML MISC USE 1 SYRINGE AS DIRECTED 3 TO 4 TIMES DAILY 100 each 0   Blood Glucose Monitoring Suppl (ONETOUCH VERIO REFLECT) w/Device KIT 1 1 kit 0   Continuous Blood Gluc Sensor (DEXCOM G6 SENSOR) MISC USE AS DIRECTED TO  MONITOR  BLOOD  SUGAR,  CHANGE  EVERY  10  DAYS 3 each 3   Continuous Blood Gluc Sensor (DEXCOM G7 SENSOR) MISC 1 Device by Subdermal route as directed. Change sensor every 10 days 3 each 3   dicyclomine (BENTYL) 10 MG capsule Take 1 capsule (10 mg total) by mouth 4 (four) times daily -  before meals and at bedtime. 120 capsule 11   fish oil-omega-3 fatty acids 1000 MG capsule Take 2 g by mouth 2 (two) times daily.     fluticasone (FLONASE) 50 MCG/ACT nasal spray SHAKE LIQUID AND USE 2 SPRAYS IN EACH NOSTRIL DAILY AS NEEDED FOR ALLERGIES OR RHINITIS 16 g 5   Glucagon (BAQSIMI ONE PACK) 3 MG/DOSE POWD To be sprayed in nostril as needed for severe hypoglycemia 1 each 1   glucose blood (ONETOUCH VERIO) test strip Use as instructed to check blood sugar 4 times per day dx code E11.9 125 each 3   irbesartan (AVAPRO) 150 MG tablet Take 1 tablet (150 mg total) by mouth daily. 90 tablet 3   Lancets (ONETOUCH DELICA PLUS [id number]) MISC Use to check blood sugar 4 times per day 200 each 2   loratadine (CLARITIN) 10 MG tablet Take 10 mg by mouth daily.     Multiple Vitamins-Minerals (MULTIVITAMIN WITH MINERALS) tablet Take 1 tablet by mouth daily.     nitroGLYCERIN (NITROSTAT) 0.4 MG SL tablet Place 1 tablet (0.4 mg total) under the tongue every 5  (five) minutes as needed for chest pain. 25 tablet 3   NOVOLOG FLEXPEN 100 UNIT/ML FlexPen INJECT 10 TO 12 UNITS SUBCUTANEOUSLY THREE TIMES DAILY BEFORE MEAL(S) (Patient taking differently: INJECT 5 TO 6 UNITS SUBCUTANEOUSLY THREE TIMES DAILY BEFORE MEAL(S)) 15 mL 2   Polyethyl Glyc-Propyl Glyc PF 0.4-0.3 % SOLN Apply 2 drops to eye 3 (three) times daily as needed (for dry eyes).     pregabalin (LYRICA) 75 MG capsule TAKE 1 CAPSULE BY MOUTH THREE TIMES DAILY 90 capsule 5   Probiotic Product (PROBIOTIC DAILY PO) Take 1 capsule by mouth daily.     rosuvastatin (CRESTOR) 40 MG tablet Take 1 tablet (40 mg total) by mouth daily. 90 tablet 3   Semaglutide,0.25 or 0.5MG/DOS, (OZEMPIC, 0.25 OR 0.5 MG/DOSE,) 2 MG/3ML SOPN Inject  0.5 mg into the skin once a week. 3 mL 1   tamsulosin (FLOMAX) 0.4 MG CAPS capsule Take 1 capsule (0.4 mg total) by mouth daily. 90 capsule 3   TRESIBA FLEXTOUCH 100 UNIT/ML FlexTouch Pen INJECT 42 UNITS SUBCUTANEOUSLY ONCE DAILY (Patient taking differently: 50 Units.) 15 mL 2   XIGDUO XR 10-998 MG TB24 Take 1 tablet by mouth twice daily 180 tablet 2   No current facility-administered medications for this encounter.    No Known Allergies  Social History   Socioeconomic History   Marital status: Married    Spouse name: Not on file   Number of children: 3   Years of education: GED   Highest education level: Not on file  Occupational History   Occupation: Works for [employer] (state).  Drives a Energy manager: [employer]    Comment: works for [employer] (state)  Tobacco Use   Smoking status: Former    Types: Cigarettes    Quit date: 06/26/1985    Years since quitting: 36.4   Smokeless tobacco: Never   Tobacco comments:    Former smoker 11/15/21  Vaping Use   Vaping Use: Never used  Substance and Sexual Activity   Alcohol use: No    Comment:     Drug use: No   Sexual activity: Not on file  Other Topics Concern   Not on file  Social History Narrative   Lives with  wife in a one story   Right handed    One story home   3 children         Social Determinants of Health   Financial Resource Strain: Not on file  Food Insecurity: Not on file  Transportation Needs: Not on file  Physical Activity: Not on file  Stress: Not on file  Social Connections: Not on file  Intimate Partner Violence: Not on file     ROS- All systems are reviewed and negative except as per the HPI above.  Physical Exam: Vitals:   11/15/21 1007  BP: 108/68  Pulse: 69  Weight: 97.6 kg  Height: '5\' 11"'  (1.803 m)     GEN- The patient is a well appearing male, alert and oriented x 3 today.   HEENT-head normocephalic, atraumatic, sclera clear, conjunctiva pink, hearing intact, trachea midline. Lungs- Clear to ausculation bilaterally, normal work of breathing Heart- Regular rate and rhythm, no murmurs, rubs or gallops  GI- soft, NT, ND, + BS Extremities- no clubbing, cyanosis, or edema MS- no significant deformity or atrophy Skin- no rash or lesion Psych- euthymic mood, full affect Neuro- strength and sensation are intact   Wt Readings from Last 3 Encounters:  11/15/21 97.6 kg  11/15/21 97.6 kg  09/27/21 102.6 kg    EKG today demonstrates  SR Vent. rate 69 BPM PR interval 164 ms QRS duration 88 ms QT/QTcB 402/430 ms  Echo 08/18/21 demonstrated  1. Left ventricular ejection fraction, by estimation, is 60 to 65%. The  left ventricle has normal function. The left ventricle has no regional  wall motion abnormalities. Left ventricular diastolic parameters were  normal.   2. Right ventricular systolic function is normal. The right ventricular  size is normal.   3. The mitral valve is normal in structure. No evidence of mitral valve  regurgitation. No evidence of mitral stenosis.   4. The aortic valve is normal in structure. Aortic valve regurgitation is  not visualized. No aortic stenosis is present.   5. The inferior vena  cava is normal in size with greater  than 50%  respiratory variability, suggesting right atrial pressure of 3 mmHg.    Epic records are reviewed at length today  CHA2DS2-VASc Score = 3  The patient's score is based upon: CHF History: 0 HTN History: 1 Diabetes History: 1 Stroke History: 0 Vascular Disease History: 1 Age Score: 0 Gender Score: 0       ASSESSMENT AND PLAN: 1. Paroxysmal Atrial Fibrillation (ICD10:  I48.0) The patient's CHA2DS2-VASc score is 3, indicating a 3.2% annual risk of stroke.   S/p afib ablation 03/24/14 Patient continues to maintain SR. Continue Eliquis 5 mg BID  2. Secondary Hypercoagulable State (ICD10:  D68.69) The patient is at significant risk for stroke/thromboembolism based upon his CHA2DS2-VASc Score of 3.  Continue Apixaban (Eliquis).   3. Obesity Body mass index is 30.01 kg/m. Lifestyle modification was discussed and encouraged including regular physical activity and weight reduction.  4. HTN Stable, no changes today.  5. CAD CAC score 2233, 99th percentile LHC showed nonobstructive disease No anginal symptoms.   Follow up with Dr Jacalyn Lefevre office per recall. AF clinic as needed.     [facility] [address] [place], [place] [address] [phone] 11/15/2021 10:23 AM

## 2021-11-17 ENCOUNTER — Ambulatory Visit (HOSPITAL_COMMUNITY): Payer: BC Managed Care – PPO | Admitting: Physician Assistant

## 2021-11-23 ENCOUNTER — Telehealth: Payer: Self-pay | Admitting: Internal Medicine

## 2021-11-24 NOTE — Telephone Encounter (Signed)
PDMP okay, Rx

## 2021-11-24 NOTE — Telephone Encounter (Signed)
Requesting: Lyrica 75mg   Contract: 01/26/21 UDS: None  Last Visit: 03/29/21 Next Visit:03/30/22 Last Refill: 05/23/21 #90 and 5RF  Please Advise

## 2021-11-29 LAB — LIPID PANEL
Chol/HDL Ratio: 2.1 ratio (ref 0.0–5.0)
Cholesterol, Total: 62 mg/dL — ABNORMAL LOW (ref 100–199)
HDL: 29 mg/dL — ABNORMAL LOW (ref >39)
LDL Chol Calc (NIH): 12 mg/dL (ref 0–99)
Triglycerides: 110 mg/dL (ref 0–149)
VLDL Cholesterol Cal: 21 mg/dL (ref 5–40)

## 2021-11-29 LAB — HEPATIC FUNCTION PANEL
ALT: 56 IU/L — ABNORMAL HIGH (ref 0–44)
AST: 32 IU/L (ref 0–40)
Albumin: 4.7 g/dL (ref 3.8–4.8)
Alkaline Phosphatase: 81 IU/L (ref 44–121)
Bilirubin Total: 0.4 mg/dL (ref 0.0–1.2)
Bilirubin, Direct: 0.13 mg/dL (ref 0.00–0.40)
Total Protein: 7.2 g/dL (ref 6.0–8.5)

## 2021-12-07 ENCOUNTER — Other Ambulatory Visit: Payer: Self-pay | Admitting: Internal Medicine

## 2021-12-08 ENCOUNTER — Other Ambulatory Visit: Payer: Self-pay | Admitting: Student

## 2021-12-10 ENCOUNTER — Other Ambulatory Visit: Payer: Self-pay | Admitting: Endocrinology

## 2021-12-14 ENCOUNTER — Other Ambulatory Visit: Payer: Self-pay | Admitting: Endocrinology

## 2021-12-14 DIAGNOSIS — E1165 Type 2 diabetes mellitus with hyperglycemia: Secondary | ICD-10-CM

## 2022-01-01 ENCOUNTER — Other Ambulatory Visit: Payer: Self-pay | Admitting: Endocrinology

## 2022-01-01 DIAGNOSIS — E1165 Type 2 diabetes mellitus with hyperglycemia: Secondary | ICD-10-CM

## 2022-01-11 ENCOUNTER — Other Ambulatory Visit: Payer: Self-pay | Admitting: Endocrinology

## 2022-01-17 ENCOUNTER — Other Ambulatory Visit (HOSPITAL_COMMUNITY): Payer: Self-pay

## 2022-01-19 ENCOUNTER — Ambulatory Visit: Payer: BC Managed Care – PPO | Admitting: Internal Medicine

## 2022-01-23 ENCOUNTER — Other Ambulatory Visit: Payer: Self-pay

## 2022-01-23 DIAGNOSIS — E1165 Type 2 diabetes mellitus with hyperglycemia: Secondary | ICD-10-CM

## 2022-01-23 MED ORDER — ONETOUCH VERIO VI STRP: ORAL_STRIP | 3 refills | Status: DC

## 2022-01-27 ENCOUNTER — Other Ambulatory Visit: Payer: Self-pay | Admitting: Endocrinology

## 2022-01-30 ENCOUNTER — Other Ambulatory Visit: Payer: Self-pay | Admitting: Endocrinology

## 2022-01-30 DIAGNOSIS — E1165 Type 2 diabetes mellitus with hyperglycemia: Secondary | ICD-10-CM

## 2022-02-03 ENCOUNTER — Other Ambulatory Visit (HOSPITAL_COMMUNITY): Payer: Self-pay

## 2022-02-03 ENCOUNTER — Telehealth: Payer: Self-pay

## 2022-02-03 NOTE — Telephone Encounter (Signed)
Patient Advocate Encounter   Received notification from [employer] that prior authorization is required for [facility] G7 Sensor.  Submitted: 02/03/22 Key [id number]  Burnell Blanks, CPhT Rx Patient Advocate Specialist Phone: [phone]

## 2022-02-06 NOTE — Telephone Encounter (Signed)
Patient Advocate Encounter  Prior Authorization for Dexcom G7 has been approved.   Effective: 02/03/2022 to 02/04/2023 Determination letter has been added to patient chart.  Burnell Blanks, CPhT Rx Patient Advocate Specialist Phone: [phone]

## 2022-02-14 ENCOUNTER — Other Ambulatory Visit: Payer: Self-pay | Admitting: Endocrinology

## 2022-03-17 ENCOUNTER — Other Ambulatory Visit: Payer: Self-pay | Admitting: Endocrinology

## 2022-03-19 ENCOUNTER — Other Ambulatory Visit: Payer: Self-pay | Admitting: Cardiology

## 2022-03-19 DIAGNOSIS — I480 Paroxysmal atrial fibrillation: Secondary | ICD-10-CM

## 2022-03-20 ENCOUNTER — Encounter: Payer: Self-pay | Admitting: Endocrinology

## 2022-03-20 ENCOUNTER — Ambulatory Visit (INDEPENDENT_AMBULATORY_CARE_PROVIDER_SITE_OTHER): Payer: BC Managed Care – PPO | Admitting: Endocrinology

## 2022-03-20 VITALS — BP 140/68 | HR 69 | Ht 71.0 in | Wt 216.0 lb

## 2022-03-20 DIAGNOSIS — E1165 Type 2 diabetes mellitus with hyperglycemia: Secondary | ICD-10-CM

## 2022-03-20 DIAGNOSIS — I10 Essential (primary) hypertension: Secondary | ICD-10-CM

## 2022-03-20 DIAGNOSIS — Z794 Long term (current) use of insulin: Secondary | ICD-10-CM

## 2022-03-20 LAB — POCT GLYCOSYLATED HEMOGLOBIN (HGB A1C): Hemoglobin A1C: 6.8 % — AB (ref 4.0–5.6)

## 2022-03-20 LAB — BASIC METABOLIC PANEL
BUN: 15 mg/dL (ref 6–23)
CO2: 30 mEq/L (ref 19–32)
Calcium: 9.5 mg/dL (ref 8.4–10.5)
Chloride: 103 mEq/L (ref 96–112)
Creatinine, Ser: 0.92 mg/dL (ref 0.40–1.50)
GFR: 88.73 mL/min (ref >60.00)
Glucose, Bld: 106 mg/dL — ABNORMAL HIGH (ref 70–99)
Potassium: 4.6 mEq/L (ref 3.5–5.1)
Sodium: 141 mEq/L (ref 135–145)

## 2022-03-20 LAB — MICROALBUMIN / CREATININE URINE RATIO
Creatinine,U: 104.8 mg/dL
Microalb Creat Ratio: 0.7 mg/g (ref 0.0–30.0)
Microalb, Ur: <0.7 mg/dL (ref 0.0–1.9)

## 2022-03-20 MED ORDER — OZEMPIC (1 MG/DOSE) 4 MG/3ML ~~LOC~~ SOPN: PEN_INJECTOR | SUBCUTANEOUS | 0 refills | Status: DC

## 2022-03-20 NOTE — Progress Notes (Unsigned)
Patient ID: Danny [name], male   DOB: 1960-08-26, 61 y.o.   MRN: [id number]    Reason for Appointment: Followup for Type 2 Diabetes  Referring physician: Larose Kells  History of Present Illness:          Diagnosis: Type 2 diabetes mellitus, date of diagnosis: 1992        Past history:  He has had long-standing diabetes and previously was treated with metformin, Amaryl, Januvia and Actos He was taken off metformin because of fear of liver problems.  May have been taken taken off Actos because of swelling of his legs but was on 45 mg at that time Over the last 2 years at least his A1c has been consistently over 8% He was started on insulin in 2011 probably when his A1c was over 9% Initially was given Lantus and Humalog and subsequently switched to 70/30 twice a day for unknown reasons Has not taken Byetta or Victoza in the past He had been on 70/30 insulin initially for a few years Previously A1c was 8% or more consistently before starting Invokamet. He was switched from Lantus to [place] in 1/17  Recent history:   INSULIN regimen is described as:  Tresiba 50 units at 9 pm  Novolog as needed  Oral hypoglycemic drugs the patient is taking are: Xigduo, 10/998, 1 tablet twice a day, Ozempic 0.5 mg weekly  His A1c has been usually in the upper normal range, now 6.3     Current management, blood sugar patterns and problems identified: He was started on OZEMPIC in March since she was requiring much more insulin especially mealtime doses  With increasing the dose to 0.5 mg weekly he has been able to reduce and even stop his NovoLog  Although he thinks he does not need any NovoLog if periodically will have blood sugars over 180 or 200 depending on his diet Also he is having significantly decreased appetite and sometimes skipping meals  Only if he has regular soft drinks his blood sugar may go up significantly He is generally trying to leave very active Has lost 10 pounds For  breakfast he is frequently eating a granola bar type of snack and blood sugars are going up since he did not take any coverage with average after breakfast 168 He is now using the G7 sensor and he thinks this is better and likely more accurate        Side effects from medications have been: None  Compliance with the medical regimen: Good   Blood Glucose patterns, dates of the Dexcom data 9/12 through 9/25:  Overall blood sugars are generally lower overnight and higher during the day  HYPERGLYCEMIC episodes are seen periodically after different meals but not consistently Overnight blood sugars are averaging in the 120-140 range and no hypoglycemia  POSTPRANDIAL readings are generally the highest on an average after breakfast and dinner averaging 169 after breakfast, 169 after dinner also and 159 after lunch Hypoglycemia not present Premeal readings are mildly increased throughout the day GMI 6.9    CGM use % of time 93  2-week average/GV 134, was 166  Time in range 91 Compared to 76    %  % Time Above 180 8     % Time Below 70 0     Glycemic control:   Lab Results  Component Value Date   HGBA1C 6.3 (A) 11/15/2021   HGBA1C 6.7 (A) 09/13/2021   HGBA1C 6.8 (  A) 05/10/2021   Lab Results  Component Value Date   MICROALBUR <0.7 08/25/2020   LDLCALC 12 11/28/2021   CREATININE 1.01 10/04/2021    Self-care:  Meals: 3 meals per day. Breakfast is eggs or cereal usually,  controlling portions      Dietician visit: Most recent: years ago.                 Weight history:  Wt Readings from Last 3 Encounters:  03/20/22 216 lb (98 kg)  11/15/21 215 lb 3.2 oz (97.6 kg)  11/15/21 215 lb 3.2 oz (97.6 kg)    No visits with results within 1 Week(s) from this visit.  Latest known visit with results is:  Office Visit on 11/15/2021  Component Date Value Ref Range Status   Hemoglobin A1C 11/15/2021 6.3 (A)  4.0 - 5.6 % Final    Allergies as of 03/20/2022   No Known Allergies       Medication List        Accurate as of March 20, 2022  1:14 PM. If you have any questions, ask your nurse or doctor.          acetaminophen 650 MG CR tablet Commonly known as: TYLENOL Take 650 mg by mouth every 8 (eight) hours as needed for pain.   azelastine 0.1 % nasal spray Commonly known as: ASTELIN Place 2 sprays into both nostrils 2 (two) times daily. Use in each nostril as directed   Baqsimi One Pack 3 MG/DOSE Powd Generic drug: Glucagon To be sprayed in nostril as needed for severe hypoglycemia   BD Veo Insulin Syringe U/F 31G X 15/64" 0.5 ML Misc Generic drug: Insulin Syringe-Needle U-100 USE 1 SYRINGE AS DIRECTED 3 TO 4 TIMES DAILY   Dexcom G6 Sensor Misc USE AS DIRECTED TO  MONITOR  BLOOD  SUGAR,  CHANGE  EVERY  10  DAYS   Dexcom G7 Sensor Misc USE AS DIRECTED. CHANGE SENSOR EVERY 10 DAYS   dicyclomine 10 MG capsule Commonly known as: BENTYL Take 1 capsule (10 mg total) by mouth 4 (four) times daily -  before meals and at bedtime.   Eliquis 5 MG Tabs tablet Generic drug: apixaban TAKE 1 TABLET(5 MG) BY MOUTH TWICE DAILY   fish oil-omega-3 fatty acids 1000 MG capsule Take 2 g by mouth 2 (two) times daily.   fluticasone 50 MCG/ACT nasal spray Commonly known as: FLONASE SHAKE LIQUID AND USE 2 SPRAYS IN EACH NOSTRIL DAILY AS NEEDED FOR ALLERGIES OR RHINITIS   irbesartan 150 MG tablet Commonly known as: AVAPRO Take 1 tablet (150 mg total) by mouth daily.   loratadine 10 MG tablet Commonly known as: CLARITIN Take 10 mg by mouth daily.   multivitamin with minerals tablet Take 1 tablet by mouth daily.   nitroGLYCERIN 0.4 MG SL tablet Commonly known as: NITROSTAT DISSOLVE ONE TABLET UNDER THE TONGUE EVERY 5 MINUTES AS NEEDED FOR CHEST PAIN.  DO NOT EXCEED A TOTAL OF 3 DOSES IN 15 MINUTES   NovoLOG FlexPen 100 UNIT/ML FlexPen Generic drug: insulin aspart INJECT 10 TO 12 UNITS SUBCUTANEOUSLY THREE TIMES DAILY BEFORE MEAL(S)   OneTouch Delica  Plus [id number] Misc Use to check blood sugar 4 times per day   OneTouch Verio Reflect w/Device Kit 1   OneTouch Verio test strip Generic drug: glucose blood Use as instructed to check blood sugar 4 times per day dx code E11.9   Ozempic (0.25 or 0.5 MG/DOSE) 2 MG/3ML Sopn Generic drug: Semaglutide(0.25 or 0.5MG/DOS)  INJECT 0.5 MG INTO THE SKIN ONCE A WEEK   Polyethyl Glyc-Propyl Glyc PF 0.4-0.3 % Soln Apply 2 drops to eye 3 (three) times daily as needed (for dry eyes).   pregabalin 75 MG capsule Commonly known as: LYRICA TAKE 1 CAPSULE BY MOUTH THREE TIMES DAILY   PROBIOTIC DAILY PO Take 1 capsule by mouth daily.   rosuvastatin 40 MG tablet Commonly known as: CRESTOR Take 1 tablet (40 mg total) by mouth daily.   tamsulosin 0.4 MG Caps capsule Commonly known as: FLOMAX Take 1 capsule (0.4 mg total) by mouth daily.   Tyler Aas FlexTouch 100 UNIT/ML FlexTouch Pen Generic drug: insulin degludec INJECT 42 UNITS SUBCUTANEOUSLY  SUBCUTANEOUSLY ONCE DAILY   Xigduo XR 10-998 MG Tb24 Generic drug: Dapagliflozin-metFORMIN HCl ER Take 1 tablet by mouth twice daily        Allergies:  No Known Allergies  Past Medical History:  Diagnosis Date   Allergic rhinitis    Benign prostatic hypertrophy    s/p TUNA procedure aprx 2006,   Carpal tunnel syndrome 06/17/2009   Diabetes mellitus    Type II dx 1999   DM type 2 with diabetic peripheral neuropathy ([place]) 10/15/2014   ED (erectile dysfunction)    After TUNA   Elevated LFTs 2010   (-) chronic hep  panel   History of cardiovascular stress test 8/11   Negative   Hyperlipidemia    HYPERSOMNIA UNSPECIFIED 12/11/2007   Hypertension    Obesity    PAF (paroxysmal atrial fibrillation) ([place]) 12/21/2009   a. Failed Multaq. b. Prev on flecainide. c. s/p afib ablation 2015.    Past Surgical History:  Procedure Laterality Date   ABLATION  03/24/2014   PVI by Dr Rayann Heman   ATRIAL FIBRILLATION ABLATION N/A 03/24/2014   Procedure:  ATRIAL FIBRILLATION ABLATION;  Surgeon: Coralyn Mark, MD;  Location: [facility] CATH LAB;  Service: Cardiovascular;  Laterality: N/A;   COLONOSCOPY  11/25/2012   Mild sigmoid diverticulosis, small internal hemorrhoids, otherwise normal colonoscopy   CYSTOSCOPY  04/26/2008   neg  @ urology, was rx Flomax   LEFT HEART CATH AND CORONARY ANGIOGRAPHY N/A 08/30/2021   Procedure: LEFT HEART CATH AND CORONARY ANGIOGRAPHY;  Surgeon: Troy Sine, MD;  Location: [place] CV LAB;  Service: Cardiovascular;  Laterality: N/A;   PROSTATE SURGERY     TUNA   TEE WITHOUT CARDIOVERSION N/A 03/23/2014   Procedure: TRANSESOPHAGEAL ECHOCARDIOGRAM (TEE);  Surgeon: Fay Records, MD;  Location: [facility] ENDOSCOPY;  Service: Cardiovascular;  Laterality: N/A;   VASECTOMY     several years ago    Family History  Problem Relation Age of Onset   Heart disease Mother    Diabetes Mother    Hypertension Mother    Congestive Heart Failure Mother    Heart attack Mother    Hypertension Father    Colon cancer Neg Hx    Prostate cancer Neg Hx     Social History:  reports that he quit smoking about 36 years ago. His smoking use included cigarettes. He has never used smokeless tobacco. He reports that he does not drink alcohol and does not use drugs.    Review of Systems       Lipids: He has been on Crestor 40 mg instead of simvastatin which he thinks was causing muscle   On bid fish oil for high triglycerides  Labs as below:      Lab Results  Component Value Date   CHOL 62 (L) 11/28/2021  HDL 29 (L) 11/28/2021   LDLCALC 12 11/28/2021   LDLDIRECT 62.0 03/29/2021   TRIG 110 11/28/2021   CHOLHDL 2.1 11/28/2021                Hypertension?  His cardiologist has started him on Avapro 150 mg daily for high normal BP Home BP 140/70  BP Readings from Last 3 Encounters:  03/20/22 (!) 140/68  11/15/21 108/68  11/15/21 118/70      Neuropathy: Has longstanding history of Numbness, pins-and-needle sensation and  stinging in feet and lower legs He has been followed by neurologist, taking 75 mg, 3 daily of Lyrica Symptoms are mostly controlled  No sensory loss on exam     LABS:  No visits with results within 1 Week(s) from this visit.  Latest known visit with results is:  Office Visit on 11/15/2021  Component Date Value Ref Range Status   Hemoglobin A1C 11/15/2021 6.3 (A)  4.0 - 5.6 % Final    Physical Examination:  BP (!) 140/68   Pulse 69   Ht '5\' 11"'  (1.803 m)   Wt 216 lb (98 kg)   SpO2 98%   BMI 30.13 kg/m    ASSESSMENT/PLAN:    Diabetes type 2 on insulin  See history of present illness for detailed discussion of current diabetes management, blood sugar patterns and problems identified  His A1c is   He is on basal bolus insulin with Ozempic and Xigduo  With adding Ozempic his blood sugars  With blood sugar patterns were analyzed with the Dexcom sensor download  Plan:  Since he has significant decrease in appetite with only 0.5 mg Ozempic he will continue same dose Discussed however that with any high carbohydrate meals and breakfast bars he will need to take small amounts of NovoLog likely between 4 to 8 units Discussed blood sugar targets after meals Only if his overnight blood sugars are below 80 he will start reducing Tresiba also by 5 units  ?  Hypertension: He has been on Avapro although not clear if he does have hypertension He will contact his cardiologist if he starts getting lightheaded or blood pressure is low normal  There are no Patient Instructions on file for this visit.      Elayne Snare 03/20/2022, 1:14 PM   Note: This office note was prepared with Dragon voice recognition system technology. Any transcriptional errors that result from this process are unintentional. .

## 2022-03-20 NOTE — Telephone Encounter (Signed)
Prescription refill request for Eliquis received. Indication: Afib  Last office visit: 11/15/21 Marlene Lard)  Scr: 1.01 (10/04/21) Age: 61 Weight: 97.6kg  Appropriate dose and refill sent to requested pharmacy.

## 2022-03-22 ENCOUNTER — Encounter: Payer: Self-pay | Admitting: Endocrinology

## 2022-03-27 ENCOUNTER — Other Ambulatory Visit: Payer: Self-pay | Admitting: Endocrinology

## 2022-03-30 ENCOUNTER — Ambulatory Visit (INDEPENDENT_AMBULATORY_CARE_PROVIDER_SITE_OTHER): Payer: BC Managed Care – PPO | Admitting: Internal Medicine

## 2022-03-30 ENCOUNTER — Encounter: Payer: Self-pay | Admitting: Internal Medicine

## 2022-03-30 VITALS — BP 124/62 | HR 81 | Temp 97.7°F | Resp 16 | Ht 71.0 in | Wt 214.5 lb

## 2022-03-30 DIAGNOSIS — Z0000 Encounter for general adult medical examination without abnormal findings: Secondary | ICD-10-CM | POA: Diagnosis not present

## 2022-03-30 DIAGNOSIS — I10 Essential (primary) hypertension: Secondary | ICD-10-CM

## 2022-03-30 DIAGNOSIS — Z23 Encounter for immunization: Secondary | ICD-10-CM | POA: Diagnosis not present

## 2022-03-30 DIAGNOSIS — E785 Hyperlipidemia, unspecified: Secondary | ICD-10-CM

## 2022-03-30 LAB — CBC WITH DIFFERENTIAL/PLATELET
Basophils Absolute: 0.1 10*3/uL (ref 0.0–0.1)
Basophils Relative: 0.9 % (ref 0.0–3.0)
Eosinophils Absolute: 0.4 10*3/uL (ref 0.0–0.7)
Eosinophils Relative: 5.5 % — ABNORMAL HIGH (ref 0.0–5.0)
HCT: 44.7 % (ref 39.0–52.0)
Hemoglobin: 15 g/dL (ref 13.0–17.0)
Lymphocytes Relative: 29 % (ref 12.0–46.0)
Lymphs Abs: 1.8 10*3/uL (ref 0.7–4.0)
MCHC: 33.5 g/dL (ref 30.0–36.0)
MCV: 84 fl (ref 78.0–100.0)
Monocytes Absolute: 0.6 10*3/uL (ref 0.1–1.0)
Monocytes Relative: 9.4 % (ref 3.0–12.0)
Neutro Abs: 3.5 10*3/uL (ref 1.4–7.7)
Neutrophils Relative %: 55.2 % (ref 43.0–77.0)
Platelets: 226 10*3/uL (ref 150.0–400.0)
RBC: 5.32 Mil/uL (ref 4.22–5.81)
RDW: 13.8 % (ref 11.5–15.5)
WBC: 6.4 10*3/uL (ref 4.0–10.5)

## 2022-03-30 LAB — LIPID PANEL
Cholesterol: 63 mg/dL (ref 0–200)
HDL: 33.9 mg/dL — ABNORMAL LOW (ref >39.00)
LDL Cholesterol: 4 mg/dL (ref 0–99)
NonHDL: 29.46
Total CHOL/HDL Ratio: 2
Triglycerides: 125 mg/dL (ref 0.0–149.0)
VLDL: 25 mg/dL (ref 0.0–40.0)

## 2022-03-30 LAB — AST: AST: 25 U/L (ref 0–37)

## 2022-03-30 LAB — ALT: ALT: 42 U/L (ref 0–53)

## 2022-03-30 NOTE — Assessment & Plan Note (Signed)
Here for CPX DM: Per endocrinology Neuropathy: Symptoms are stable & mildly bothersome to the patient. Feet care discussed. HTN: BP is very good today, continue Iversartan High cholesterol: On Crestor.  Labs. Atrial fibrillation: Saw cardiology 10/2021, seems stable BPH: Sees urology RTC 1 year

## 2022-03-30 NOTE — Progress Notes (Signed)
Subjective:    Patient ID: Danny [name], male    DOB: 1960-07-29, 61 y.o.   MRN: [id number]  DOS:  03/30/2022 Type of visit - description: CPX  Here for CPX Since the last office visit is doing well. Denies chest pain or difficulty breathing. Continue with neuropathy symptoms, at this point they are stable and only mildly bothersome.   Review of Systems  Other than above, a 14 point review of systems is negative      Past Medical History:  Diagnosis Date   Allergic rhinitis    Benign prostatic hypertrophy    s/p TUNA procedure aprx 2006,   Carpal tunnel syndrome 06/17/2009   Diabetes mellitus    Type II dx 1999   DM type 2 with diabetic peripheral neuropathy ([place]) 10/15/2014   ED (erectile dysfunction)    After TUNA   Elevated LFTs 2010   (-) chronic hep  panel   History of cardiovascular stress test 8/11   Negative   Hyperlipidemia    HYPERSOMNIA UNSPECIFIED 12/11/2007   Hypertension    Obesity    PAF (paroxysmal atrial fibrillation) ([place]) 12/21/2009   a. Failed Multaq. b. Prev on flecainide. c. s/p afib ablation 2015.    Past Surgical History:  Procedure Laterality Date   ABLATION  03/24/2014   PVI by Dr Rayann Heman   ATRIAL FIBRILLATION ABLATION N/A 03/24/2014   Procedure: ATRIAL FIBRILLATION ABLATION;  Surgeon: Coralyn Mark, MD;  Location: [facility];  Service: Cardiovascular;  Laterality: N/A;   COLONOSCOPY  11/25/2012   Mild sigmoid diverticulosis, small internal hemorrhoids, otherwise normal colonoscopy   CYSTOSCOPY  04/26/2008   neg  @ urology, was rx Flomax   LEFT HEART CATH AND CORONARY ANGIOGRAPHY N/A 08/30/2021   Procedure: LEFT HEART CATH AND CORONARY ANGIOGRAPHY;  Surgeon: Troy Sine, MD;  Location: [facility];  Service: Cardiovascular;  Laterality: N/A;   PROSTATE SURGERY     TUNA   TEE WITHOUT CARDIOVERSION N/A 03/23/2014   Procedure: TRANSESOPHAGEAL ECHOCARDIOGRAM (TEE);  Surgeon: Fay Records, MD;  Location: [facility];   Service: Cardiovascular;  Laterality: N/A;   VASECTOMY     several years ago   Social History   Socioeconomic History   Marital status: Married    Spouse name: Not on file   Number of children: 3   Years of education: GED   Highest education level: Not on file  Occupational History   Occupation: Works for [employer] (state).  Drives a Energy manager: [employer]    Comment: works for [employer] (state)  Tobacco Use   Smoking status: Former    Types: Cigarettes    Quit date: 06/26/1985    Years since quitting: 36.7   Smokeless tobacco: Never   Tobacco comments:    Former smoker 11/15/21  Vaping Use   Vaping Use: Never used  Substance and Sexual Activity   Alcohol use: No    Comment:     Drug use: No   Sexual activity: Not on file  Other Topics Concern   Not on file  Social History Narrative   Lives with wife in a one story   Right handed    One story home   3 children         Social Determinants of Health   Financial Resource Strain: Not on file  Food Insecurity: Not on file  Transportation Needs: Not on file  Physical Activity: Not on file  Stress: Not on file  Social Connections: Not on file  Intimate Partner Violence: Not on file    Current Outpatient Medications  Medication Instructions   acetaminophen (TYLENOL) 650 mg, Oral, Every 8 hours PRN   azelastine (ASTELIN) 0.1 % nasal spray 2 sprays, Each Nare, 2 times daily, Use in each nostril as directed   BD VEO INSULIN SYRINGE U/F 31G X 15/64" 0.5 ML MISC USE 1 SYRINGE AS DIRECTED 3 TO 4 TIMES DAILY   Blood Glucose Monitoring Suppl (ONETOUCH VERIO REFLECT) w/Device KIT 1   Continuous Blood Gluc Sensor (DEXCOM G6 SENSOR) MISC USE AS DIRECTED TO  MONITOR  BLOOD  SUGAR,  CHANGE  EVERY  10  DAYS   Continuous Blood Gluc Sensor (DEXCOM G7 SENSOR) MISC USE AS DIRECTED. CHANGE SENSOR EVERY 10 DAYS   dicyclomine (BENTYL) 10 mg, Oral, 3 times daily before meals & bedtime   ELIQUIS 5 MG TABS tablet TAKE 1 TABLET(5 MG) BY  MOUTH TWICE DAILY   fish oil-omega-3 fatty acids 2 g, Oral, 2 times daily,     fluticasone (FLONASE) 50 MCG/ACT nasal spray SHAKE LIQUID AND USE 2 SPRAYS IN EACH NOSTRIL DAILY AS NEEDED FOR ALLERGIES OR RHINITIS   Glucagon (BAQSIMI ONE PACK) 3 MG/DOSE POWD To be sprayed in nostril as needed for severe hypoglycemia   glucose blood (ONETOUCH VERIO) test strip Use as instructed to check blood sugar 4 times per day dx code E11.9   irbesartan (AVAPRO) 150 mg, Oral, Daily   Lancets (ONETOUCH DELICA PLUS PRXYVO59Y) MISC Use to check blood sugar 4 times per day   loratadine (CLARITIN) 10 mg, Oral, Daily   Multiple Vitamins-Minerals (MULTIVITAMIN WITH MINERALS) tablet 1 tablet, Oral, Daily   nitroGLYCERIN (NITROSTAT) 0.4 MG SL tablet DISSOLVE ONE TABLET UNDER THE TONGUE EVERY 5 MINUTES AS NEEDED FOR CHEST PAIN.  DO NOT EXCEED A TOTAL OF 3 DOSES IN 15 MINUTES   NOVOLOG FLEXPEN 100 UNIT/ML FlexPen INJECT 10 TO 12 UNITS SUBCUTANEOUSLY THREE TIMES DAILY BEFORE MEAL(S)   Polyethyl Glyc-Propyl Glyc PF 0.4-0.3 % SOLN 2 drops, Ophthalmic, 3 times daily PRN   pregabalin (LYRICA) 75 MG capsule TAKE 1 CAPSULE BY MOUTH THREE TIMES DAILY   Probiotic Product (PROBIOTIC DAILY PO) 1 capsule, Oral, Daily   rosuvastatin (CRESTOR) 40 mg, Oral, Daily   Semaglutide, 1 MG/DOSE, (OZEMPIC, 1 MG/DOSE,) 4 MG/3ML SOPN Inject 1 mg weekly   tamsulosin (FLOMAX) 0.4 mg, Oral, Daily   TRESIBA FLEXTOUCH 100 UNIT/ML FlexTouch Pen INJECT 42 UNITS SUBCUTANEOUSLY  SUBCUTANEOUSLY ONCE DAILY   XIGDUO XR 10-998 MG TB24 Take 1 tablet by mouth twice daily       Objective:   Physical Exam BP 124/62   Pulse 81   Temp 97.7 F (36.5 C) (Oral)   Resp 16   Ht '5\' 11"'  (1.803 m)   Wt 214 lb 8 oz (97.3 kg)   SpO2 95%   BMI 29.92 kg/m  General: Well developed, NAD, BMI noted Neck: No  thyromegaly  HEENT:  Normocephalic . Face symmetric, atraumatic Lungs:  CTA B Normal respiratory effort, no intercostal retractions, no accessory muscle  use. Heart: RRR,  no murmur.  Abdomen:  Not distended, soft, non-tender. No rebound or rigidity.   DM foot exam: No edema, skin normal, pedal pulses normal, pinprick examination normal Skin: Exposed areas without rash. Not pale. Not jaundice Neurologic:  alert & oriented X3.  Speech normal, gait appropriate for age and unassisted Strength symmetric and appropriate for age.  Psych: Cognition  and judgment appear intact.  Cooperative with normal attention span and concentration.  Behavior appropriate. No anxious or depressed appearing.     Assessment     Assessment DM , Dr Dwyane Dee Neuropathy due to DM: NCV 2016: + Peripheral neuropathy HTN Hyperlipidemia CV: - Atrial fibrillation, 2011, s/p ablation 02-2014, now sinus rhythm, anticoagulated -High calcium coronary score, cardiac catheterization: Nonobstructive disease BPH s/p TUNA ED S/P shingles, postherpetic neuralgia R foot TIA (?) 10-2016, saw neuro, Rx carotid US: L ICA 1-39% otherwise (-).  Carotid US negative (01-2020)  PLAN Here for CPX DM: Per endocrinology Neuropathy: Symptoms are stable & mildly bothersome to the patient. Feet care discussed. HTN: BP is very good today, continue Iversartan High cholesterol: On Crestor.  Labs. Atrial fibrillation: Saw cardiology 10/2021, seems stable BPH: Sees urology RTC 1 year

## 2022-03-30 NOTE — Assessment & Plan Note (Signed)
-  Tdap: 2022 - pnm shot 2015;  prevnar 07-2015 - RSV discussed - s/p Shingrix x2 -  Covid vax: Recommended, plans to proceed - flu shot: Today -CCS:  Cscope Dr Lyndel Safe in [place] : 2009 hyperplastic polyps.  Colonoscopy 11-2012  Colonoscopy 08/07/2017, no polyps, next 5 years (Dr. Brennan Bailey) -Prostate cancer screening, PSA 0.2  (05-2021), LOV w/ urology 12-2021 -Labs:FLP AST ALT CBC -Diet-exercise - discussed

## 2022-03-30 NOTE — Patient Instructions (Addendum)
Vaccines I recommend:  Covid booster RSV vaccine  Check the  blood pressure regularly BP GOAL is between 110/65 and  135/85. If it is consistently higher or lower, let me know   GO TO THE LAB : Get the blood work     [place], [place] back for   a physical exam in 1 year    Per our records you are due for your diabetic eye exam. Please contact your eye doctor to schedule an appointment. Please have them send copies of your office visit notes to [place]. Our fax number is [phone]. If you need a referral to an eye doctor please let us know.  Diabetes Mellitus and Foot Care Foot care is an important part of your health, especially when you have diabetes. Diabetes may cause you to have problems because of poor blood flow (circulation) to your feet and legs, which can cause your skin to: Become thinner and drier. Break more easily. Heal more slowly. Peel and crack. You may also have nerve damage (neuropathy) in your legs and feet, causing decreased feeling in them. This means that you may not notice minor injuries to your feet that could lead to more serious problems. Noticing and addressing any potential problems early is the best way to prevent future foot problems. How to care for your feet Foot hygiene  Wash your feet daily with warm water and mild soap. Do not use hot water. Then, pat your feet and the areas between your toes until they are completely dry. Do not soak your feet as this can dry your skin. Trim your toenails straight across. Do not dig under them or around the cuticle. File the edges of your nails with an emery board or nail file. Apply a moisturizing lotion or petroleum jelly to the skin on your feet and to dry, brittle toenails. Use lotion that does not contain alcohol and is unscented. Do not apply lotion between your toes. Shoes and socks Wear clean socks or stockings every day. Make sure they are not too tight. Do not  wear knee-high stockings since they may decrease blood flow to your legs. Wear shoes that fit properly and have enough cushioning. Always look in your shoes before you put them on to be sure there are no objects inside. To break in new shoes, wear them for just a few hours a day. This prevents injuries on your feet. Wounds, scrapes, corns, and calluses  Check your feet daily for blisters, cuts, bruises, sores, and redness. If you cannot see the bottom of your feet, use a mirror or ask someone for help. Do not cut corns or calluses or try to remove them with medicine. If you find a minor scrape, cut, or break in the skin on your feet, keep it and the skin around it clean and dry. You may clean these areas with mild soap and water. Do not clean the area with peroxide, alcohol, or iodine. If you have a wound, scrape, corn, or callus on your foot, look at it several times a day to make sure it is healing and not infected. Check for: Redness, swelling, or pain. Fluid or blood. Warmth. Pus or a bad smell. General tips Do not cross your legs. This may decrease blood flow to your feet. Do not use heating pads or hot water bottles on your feet. They may burn your skin. If you have lost feeling in your feet or legs, you  may not know this is happening until it is too late. Protect your feet from hot and cold by wearing shoes, such as at the beach or on hot pavement. Schedule a complete foot exam at least once a year (annually) or more often if you have foot problems. Report any cuts, sores, or bruises to your health care provider immediately. Where to find more information American Diabetes Association: www.diabetes.org Association of Diabetes Care & Education Specialists: www.diabeteseducator.org Contact a health care provider if: You have a medical condition that increases your risk of infection and you have any cuts, sores, or bruises on your feet. You have an injury that is not healing. You have  redness on your legs or feet. You feel burning or tingling in your legs or feet. You have pain or cramps in your legs and feet. Your legs or feet are numb. Your feet always feel cold. You have pain around any toenails. Get help right away if: You have a wound, scrape, corn, or callus on your foot and: You have pain, swelling, or redness that gets worse. You have fluid or blood coming from the wound, scrape, corn, or callus. Your wound, scrape, corn, or callus feels warm to the touch. You have pus or a bad smell coming from the wound, scrape, corn, or callus. You have a fever. You have a red line going up your leg. Summary Check your feet every day for blisters, cuts, bruises, sores, and redness. Apply a moisturizing lotion or petroleum jelly to the skin on your feet and to dry, brittle toenails. Wear shoes that fit properly and have enough cushioning. If you have foot problems, report any cuts, sores, or bruises to your health care provider immediately. Schedule a complete foot exam at least once a year (annually) or more often if you have foot problems. This information is not intended to replace advice given to you by your health care provider. Make sure you discuss any questions you have with your health care provider. Document Revised: 01/01/2020 Document Reviewed: 01/01/2020 Elsevier Patient Education  2023 [employer].

## 2022-04-01 ENCOUNTER — Other Ambulatory Visit: Payer: Self-pay | Admitting: Endocrinology

## 2022-04-07 ENCOUNTER — Other Ambulatory Visit: Payer: Self-pay | Admitting: Endocrinology

## 2022-04-24 ENCOUNTER — Encounter: Payer: Self-pay | Admitting: Internal Medicine

## 2022-05-01 ENCOUNTER — Other Ambulatory Visit: Payer: Self-pay | Admitting: Internal Medicine

## 2022-05-01 ENCOUNTER — Ambulatory Visit (INDEPENDENT_AMBULATORY_CARE_PROVIDER_SITE_OTHER): Payer: BC Managed Care – PPO | Admitting: Podiatry

## 2022-05-01 DIAGNOSIS — L600 Ingrowing nail: Secondary | ICD-10-CM

## 2022-05-01 DIAGNOSIS — L909 Atrophic disorder of skin, unspecified: Secondary | ICD-10-CM

## 2022-05-01 DIAGNOSIS — M79675 Pain in left toe(s): Secondary | ICD-10-CM | POA: Diagnosis not present

## 2022-05-01 DIAGNOSIS — M7741 Metatarsalgia, right foot: Secondary | ICD-10-CM

## 2022-05-01 DIAGNOSIS — M79674 Pain in right toe(s): Secondary | ICD-10-CM | POA: Diagnosis not present

## 2022-05-01 DIAGNOSIS — B351 Tinea unguium: Secondary | ICD-10-CM

## 2022-05-01 DIAGNOSIS — Z794 Long term (current) use of insulin: Secondary | ICD-10-CM

## 2022-05-01 DIAGNOSIS — M7742 Metatarsalgia, left foot: Secondary | ICD-10-CM

## 2022-05-01 DIAGNOSIS — E0840 Diabetes mellitus due to underlying condition with diabetic neuropathy, unspecified: Secondary | ICD-10-CM

## 2022-05-01 NOTE — Progress Notes (Signed)
  Subjective:  Patient ID: Danny [name], male    DOB: 09-Apr-1961,  MRN: [id number]  Chief Complaint  Patient presents with   Callouses    Callus to bilateral ball of foot. Painful.    Nail Problem    Nail trim  Painful left hallux. Nail was removed and its growing back and it is sensitive to touch.     61 y.o. male presents with the above complaint. History confirmed with patient. Patient presenting with pain related to dystrophic thickened elongated nails. Patient is unable to trim own nails related to nail dystrophy and/or mobility issues. Patient does  have a history of T2DM. Patient does have callus present located at the plantar 1st MPJ bilateral related to metatarsalgia/pes cavus causing pain. He is also having some pain in the left hallux nail. States the nail is painful with pressure on it. Has not tried anything yet.   Objective:  Physical Exam: warm, good capillary refill nail exam onychomycosis of the toenails, onycholysis, and dystrophic nails DP pulses palpable, PT pulses palpable, and protective sensation intact Left Foot:  Pain with palpation of nails due to elongation and dystrophic growth.  Right Foot: Pain with palpation of nails due to elongation and dystrophic growth.   Assessment:   1. Pain due to onychomycosis of toenails of both feet   2. Fat pad atrophy of foot   3. Metatarsalgia of both feet   4. Diabetes mellitus due to underlying condition, controlled, with diabetic neuropathy, with long-term current use of insulin ([place])      Plan:  Patient was evaluated and treated and all questions answered.  #Metatarsalgia bilateral foot - recommend gel plantar metatarsal head sleeves for bilaterall foot, dispensed x 2 to patient.  - Continue use of custom inserts, gel cushioning shoes when working  #Onychomycosis with pain  -Nails palliatively debrided as below. -Educated on self-care -Will monitor Left hallux nail ingrown for now, may need total permanent  hallux nail P&A procedure in future.   Procedure: Nail Debridement Rationale: Pain Type of Debridement: manual, sharp debridement. Instrumentation: Nail nipper, rotary burr. Number of Nails: 10  Return in about 3 months (around 08/01/2022) for [facility].         Everitt Amber, DPM [facility]

## 2022-05-04 ENCOUNTER — Other Ambulatory Visit: Payer: Self-pay | Admitting: Endocrinology

## 2022-05-05 ENCOUNTER — Other Ambulatory Visit: Payer: Self-pay | Admitting: Endocrinology

## 2022-05-11 NOTE — Progress Notes (Unsigned)
Cardiology Clinic Note   Patient Name: Danny [name] Date of Encounter: 05/17/2022  Primary Care Provider:  Colon Branch, MD Primary Cardiologist:  Kirk Ruths, MD  Patient Profile    Danny [name] 61 year old male presents the clinic today for follow-up evaluation of his HTN and paroxysmal atrial fibrillation.  Past Medical History    Past Medical History:  Diagnosis Date   Allergic rhinitis    Benign prostatic hypertrophy    s/p TUNA procedure aprx 2006,   Carpal tunnel syndrome 06/17/2009   Diabetes mellitus    Type II dx 1999   DM type 2 with diabetic peripheral neuropathy ([place]) 10/15/2014   ED (erectile dysfunction)    After TUNA   Elevated LFTs 2010   (-) chronic hep  panel   History of cardiovascular stress test 8/11   Negative   Hyperlipidemia    HYPERSOMNIA UNSPECIFIED 12/11/2007   Hypertension    Obesity    PAF (paroxysmal atrial fibrillation) ([place]) 12/21/2009   a. Failed Multaq. b. Prev on flecainide. c. s/p afib ablation 2015.   Past Surgical History:  Procedure Laterality Date   ABLATION  03/24/2014   PVI by Dr Rayann Heman   ATRIAL FIBRILLATION ABLATION N/A 03/24/2014   Procedure: ATRIAL FIBRILLATION ABLATION;  Surgeon: Coralyn Mark, MD;  Location: [facility];  Service: Cardiovascular;  Laterality: N/A;   COLONOSCOPY  11/25/2012   Mild sigmoid diverticulosis, small internal hemorrhoids, otherwise normal colonoscopy   CYSTOSCOPY  04/26/2008   neg  @ urology, was rx Flomax   LEFT HEART CATH AND CORONARY ANGIOGRAPHY N/A 08/30/2021   Procedure: LEFT HEART CATH AND CORONARY ANGIOGRAPHY;  Surgeon: Troy Sine, MD;  Location: [facility];  Service: Cardiovascular;  Laterality: N/A;   PROSTATE SURGERY     TUNA   TEE WITHOUT CARDIOVERSION N/A 03/23/2014   Procedure: TRANSESOPHAGEAL ECHOCARDIOGRAM (TEE);  Surgeon: Fay Records, MD;  Location: [facility] ENDOSCOPY;  Service: Cardiovascular;  Laterality: N/A;   VASECTOMY     several years ago     Allergies  No Known Allergies  History of Present Illness    Danny [name] has a PMH of HTN, PAF, GI bleed, type 2 diabetes, prostate hypertrophy, gross hematuria, HLD, and secondary hypercoagulable state.  He underwent A-fib ablation 9/15.  He was then seen for complaints of syncope and chest discomfort.  He wore a cardiac event monitor 2/23 which showed no atrial fibrillation and rare SVT and ventricular ectopy.  A cardiac CTA 2/23 showed a calcium score of 2233 which placed him in the 99th percentile.  His echocardiogram 9/15 showed normal LV function with no significant valvular abnormalities.  His carotid Dopplers 2/23 showed no significant stenosis.  Follow-up echocardiogram 2/23 showed normal LV function.  He underwent cardiac catheterization 3/23 which showed 40% mid RCA, 50% mid LAD with mildly elevated LVEDP and an EF of 55-65%.  He was seen in follow-up by Dr. Stanford Breed on 09/27/2021.  During that time he denied chest pain, presyncope, syncope, palpitations.  He did note occasional slight lower extremity swelling.  He noted dyspnea with more vigorous activities.  He presents to the clinic today for follow-up evaluation states he continues to do well.  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  His EKG today shows normal sinus rhythm 71 bpm.  He did note 1 episode of dizziness that was brief 2  to 3 weeks ago.  He has had no further episodes.  He does note occasional cramping in his lower extremities.  He denies claudication with ambulation.  He denies episodes of atrial fibrillation and reports compliance with his apixaban.  I will order a CBC and BMP.  We will give him a salty 6 diet sheet and plan follow-up in 9 to 12 months.  Today he denies chest pain, shortness of breath, lower extremity edema, fatigue, palpitations, melena, hematuria, hemoptysis, diaphoresis, weakness, presyncope, syncope, orthopnea, and  PND.     Home Medications    Prior to Admission medications   Medication Sig Start Date End Date Taking? Authorizing Provider  acetaminophen (TYLENOL) 650 MG CR tablet Take 650 mg by mouth every 8 (eight) hours as needed for pain.    [provider]  azelastine (ASTELIN) 0.1 % nasal spray Place 2 sprays into both nostrils 2 (two) times daily. Use in each nostril as directed 12/14/20   Colon Branch, MD  BD VEO INSULIN SYRINGE U/F 31G X 15/64" 0.5 ML MISC USE 1 SYRINGE AS DIRECTED 3 TO 4 TIMES DAILY 07/10/19   Elayne Snare, MD  Blood Glucose Monitoring Suppl (ONETOUCH VERIO REFLECT) w/Device KIT 1 10/04/21   Elayne Snare, MD  Continuous Blood Gluc Sensor (DEXCOM G6 SENSOR) MISC USE AS DIRECTED TO  MONITOR  BLOOD  SUGAR,  CHANGE  EVERY  10  DAYS 09/12/21   Elayne Snare, MD  Continuous Blood Gluc Sensor (DEXCOM G7 SENSOR) MISC USE AS DIRECTED. CHANGE EVERY 10 DAYS. 04/03/22   Elayne Snare, MD  dicyclomine (BENTYL) 10 MG capsule Take 1 capsule (10 mg total) by mouth 4 (four) times daily -  before meals and at bedtime. 06/23/21   Jackquline Denmark, MD  ELIQUIS 5 MG TABS tablet TAKE 1 TABLET(5 MG) BY MOUTH TWICE DAILY 03/20/22   Lelon Perla, MD  fish oil-omega-3 fatty acids 1000 MG capsule Take 2 g by mouth 2 (two) times daily.    [provider]  fluticasone (FLONASE) 50 MCG/ACT nasal spray SHAKE LIQUID AND USE 2 SPRAYS IN EACH NOSTRIL DAILY AS NEEDED FOR ALLERGIES OR RHINITIS 12/07/21   Colon Branch, MD  Glucagon (BAQSIMI ONE PACK) 3 MG/DOSE POWD To be sprayed in nostril as needed for severe hypoglycemia 10/10/21   Elayne Snare, MD  glucose blood (ONETOUCH VERIO) test strip Use as instructed to check blood sugar 4 times per day dx code E11.9 01/23/22   Elayne Snare, MD  irbesartan (AVAPRO) 150 MG tablet Take 1 tablet (150 mg total) by mouth daily. 09/27/21   Lelon Perla, MD  Lancets [facility] DELICA PLUS [id number]) MISC Use to check blood sugar 4 times per day 10/04/21   Elayne Snare, MD   loratadine (CLARITIN) 10 MG tablet Take 10 mg by mouth daily.    [provider]  Multiple Vitamins-Minerals (MULTIVITAMIN WITH MINERALS) tablet Take 1 tablet by mouth daily.    [provider]  nitroGLYCERIN (NITROSTAT) 0.4 MG SL tablet DISSOLVE ONE TABLET UNDER THE TONGUE EVERY 5 MINUTES AS NEEDED FOR CHEST PAIN.  DO NOT EXCEED A TOTAL OF 3 DOSES IN 15 MINUTES Patient not taking: Reported on 03/30/2022 12/08/21   Shirley Friar, PA-C  NOVOLOG FLEXPEN 100 UNIT/ML FlexPen INJECT 10 TO 12 UNITS SUBCUTANEOUSLY THREE TIMES DAILY BEFORE MEAL(S) 03/29/22   Elayne Snare, MD  OZEMPIC, 1 MG/DOSE, 4 MG/3ML SOPN INJECT 1 DOSE ONCE A WEEK 05/08/22   Elayne Snare, MD  Polyethyl Glyc-Propyl  Glyc PF 0.4-0.3 % SOLN Apply 2 drops to eye 3 (three) times daily as needed (for dry eyes).    [provider]  pregabalin (LYRICA) 75 MG capsule TAKE 1 CAPSULE BY MOUTH THREE TIMES DAILY 11/24/21   Colon Branch, MD  Probiotic Product (PROBIOTIC DAILY PO) Take 1 capsule by mouth daily.    [provider]  rosuvastatin (CRESTOR) 40 MG tablet Take 1 tablet (40 mg total) by mouth daily. 09/27/21   Lelon Perla, MD  tamsulosin (FLOMAX) 0.4 MG CAPS capsule Take 1 capsule (0.4 mg total) by mouth daily. 04/08/19   Colon Branch, MD  TRESIBA FLEXTOUCH 100 UNIT/ML FlexTouch Pen INJECT 42 UNITS SUBCUTANEOUSLY ONCE DAILY 05/01/22   Elayne Snare, MD  XIGDUO XR 10-998 MG TB24 Take 1 tablet by mouth twice daily 08/19/21   Elayne Snare, MD    Family History    Family History  Problem Relation Age of Onset   Heart disease Mother    Diabetes Mother    Hypertension Mother    Congestive Heart Failure Mother    Heart attack Mother    Hypertension Father    Colon cancer Neg Hx    Prostate cancer Neg Hx    He indicated that his mother is deceased. He indicated that his father is deceased. He indicated that both of his sisters are alive. He indicated that both of his brothers are alive. He indicated  that his maternal grandmother is deceased. He indicated that his maternal grandfather is deceased. He indicated that his paternal grandmother is deceased. He indicated that his paternal grandfather is deceased. He indicated that the status of his neg hx is unknown.  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: [employer]    Comment: works for [employer] (state)  Tobacco Use   Smoking status: Former    Types: Cigarettes    Quit date: 06/26/1985    Years since quitting: 36.9   Smokeless tobacco: Never   Tobacco comments:    Former smoker 11/15/21  Vaping Use   Vaping Use: Never used  Substance and Sexual Activity   Alcohol use: No    Comment:     Drug use: No   Sexual activity: Not on file  Other Topics Concern   Not on file  Social History Narrative   Lives with wife in a one story   Right handed    One story home   3 children         Social Determinants of Health   Financial Resource Strain: Not on file  Food Insecurity: Not on file  Transportation Needs: Not on file  Physical Activity: Not on file  Stress: Not on file  Social Connections: Not on file  Intimate Partner Violence: Not on file     Review of Systems    General:  No chills, fever, night sweats or weight changes.  Cardiovascular:  No chest pain, dyspnea on exertion, edema, orthopnea, palpitations, paroxysmal nocturnal dyspnea. Dermatological: No rash, lesions/masses Respiratory: No cough, dyspnea Urologic: No hematuria, dysuria Abdominal:   No nausea, vomiting, diarrhea, bright red blood per rectum, melena, or hematemesis Neurologic:  No visual changes,  wkns, changes in mental status. All other systems reviewed and are otherwise negative except as noted  above.  Physical Exam    VS:  BP 122/68   Pulse 71   Ht _0  (1.803 m)   Wt 217 lb 9.6 oz (98.7 kg)   SpO2 98%   BMI 30.35 kg/m  , BMI Body mass index is 30.35 kg/m. GEN: Well nourished, well developed, in no acute distress. HEENT: normal. Neck: Supple, no JVD, carotid bruits, or masses. Cardiac: RRR, no murmurs, rubs, or gallops. No clubbing, cyanosis, edema.  Radials/DP/PT 2+ and equal bilaterally.  Respiratory:  Respirations regular and unlabored, clear to auscultation bilaterally. GI: Soft, nontender, nondistended, BS + x 4. MS: no deformity or atrophy. Skin: warm and dry, no rash. Neuro:  Strength and sensation are intact. Psych: Normal affect.  Accessory Clinical Findings    Recent Labs: 08/02/2021: Magnesium 2.4 03/20/2022: BUN 15; Creatinine, Ser 0.92; Potassium 4.6; Sodium 141 03/30/2022: ALT 42; Hemoglobin 15.0; Platelets 226.0   Recent Lipid Panel    Component Value Date/Time   CHOL 63 03/30/2022 0905   CHOL 62 (L) 11/28/2021 0901   TRIG 125.0 03/30/2022 0905   HDL 33.90 (L) 03/30/2022 0905   HDL 29 (L) 11/28/2021 0901   CHOLHDL 2 03/30/2022 0905   VLDL 25.0 03/30/2022 0905   LDLCALC 4 03/30/2022 0905   LDLCALC 12 11/28/2021 0901   LDLCALC 42 03/12/2020 0824   LDLDIRECT 62.0 03/29/2021 1529         ECG personally reviewed by me today-normal sinus rhythm 71 bpm no ectopy- No acute changes  Echocardiogram 08/18/2021   Prox RCA lesion is 20% stenosed.   Prox RCA to Mid RCA lesion is 40% stenosed.   Prox Cx lesion is 5% stenosed.   Mid LAD lesion is 50% stenosed.   Ramus lesion is 20% stenosed.   LV end diastolic pressure is mildly elevated.   The left ventricular ejection fraction is 55-65% by visual estimate.   Multi-vessel coronary calcification and mild luminal irregularities. The LAD has focal 50% mid stenosis. There is mild 20% ostial stenosis of a ramus intermediate vessel. The proximal circumflex has mild stenosis of 5 to 10%. The RCA is a  large dominant vessel with mild luminal irregularity with 20% proximal stenosis and 30 to 40% mid stenosis..   Normal LV function with EF estimated 55 to 65% without wall motion abnormalities.  LVEDP 19 mmHg.   RECOMMENDATION: Medical therapy for nonobstructive CAD.  Patient is not on any anti-ischemic therapy.  Consider adding low-dose beta-blocker therapy if heart rate and blood pressure allow.  Patient can resume Eliquis tomorrow.  If no contraindications with underlying CAD can also add low-dose aspirin 81 mg.  Aggressive lipid-lowering therapy with target LDL in the 60s or below.  Cardiac catheterization 08/30/2021   Prox RCA lesion is 20% stenosed.   Prox RCA to Mid RCA lesion is 40% stenosed.   Prox Cx lesion is 5% stenosed.   Mid LAD lesion is 50% stenosed.   Ramus lesion is 20% stenosed.   LV end diastolic pressure is mildly elevated.   The left ventricular ejection fraction is 55-65% by visual estimate.   Multi-vessel coronary calcification and mild luminal irregularities. The LAD has focal 50% mid stenosis. There is mild 20% ostial stenosis of a ramus intermediate vessel. The proximal circumflex has mild stenosis of 5 to 10%. The RCA is a large dominant vessel with mild luminal irregularity with 20% proximal stenosis and 30  to 40% mid stenosis..   Normal LV function with EF estimated 55 to 65% without wall motion abnormalities.  LVEDP 19 mmHg.   RECOMMENDATION: Medical therapy for nonobstructive CAD.  Patient is not on any anti-ischemic therapy.  Consider adding low-dose beta-blocker therapy if heart rate and blood pressure allow.  Patient can resume Eliquis tomorrow.  If no contraindications with underlying CAD can also add low-dose aspirin 81 mg.  Aggressive lipid-lowering therapy with target LDL in the 60s or below. Diagnostic Dominance: Right  Intervention   Assessment & Plan   1.  Paroxysmal atrial fibrillation-heart rate today 71 bpm denies recent episodes of  irregular or accelerated heart rate.  Reports compliance with apixaban and denies bleeding issues. Continue apixaban Heart healthy low-sodium diet-salty 6 given Increase physical activity as tolerated Avoid triggers caffeine, chocolate, EtOH etc.  Coronary artery disease-no chest pain today.  Denies recent episodes of arm neck back or chest discomfort.  Underwent catheterization 3/23 which showed 40% mid RCA, 50% mid LAD mildly elevated LVEDP and EF of 55 to 65%. Continue omega-3, rosuvastatin, irbesartan, sublingual nitroglycerin as needed Heart healthy low-sodium diet-salty 6 given Increase physical activity as tolerated  Hyperlipidemia-LDL 4 on 03/30/2022 Continue omega-3 fatty acid, rosuvastatin Heart healthy low-sodium high-fiber diet Increase physical activity as tolerated  Essential hypertension-BP today 122/68.  Continue irbesartan Heart healthy low-sodium diet Increase physical activity as tolerated  Near syncope-denies recent episodes of lightheadedness, presyncope and syncope. No plans for further monitoring at this time.  Previous cardiac event monitor showed atrial fibrillation and rare SVT with ventricular ectopy. Avoid triggers caffeine, chocolate, EtOH, dehydration etc.  Disposition: Follow-up with Dr. Stanford Breed or me in 9 months.   Jossie Ng. [place] Bowlds NP-C     05/17/2022, 9:52 AM [place] [address] Office [phone] Fax [phone]    I spent 14 minutes examining this patient, reviewing medications, and using patient centered shared decision making involving her cardiac care.  Prior to her visit I spent greater than 20 minutes reviewing her past medical history,  medications, and prior cardiac tests.

## 2022-05-15 ENCOUNTER — Other Ambulatory Visit: Payer: Self-pay | Admitting: Endocrinology

## 2022-05-15 DIAGNOSIS — Z794 Long term (current) use of insulin: Secondary | ICD-10-CM

## 2022-05-17 ENCOUNTER — Ambulatory Visit: Payer: BC Managed Care – PPO | Attending: General Practice | Admitting: General Practice

## 2022-05-17 ENCOUNTER — Encounter: Payer: Self-pay | Admitting: General Practice

## 2022-05-17 VITALS — BP 122/68 | HR 71 | Ht 71.0 in | Wt 217.6 lb

## 2022-05-17 DIAGNOSIS — I480 Paroxysmal atrial fibrillation: Secondary | ICD-10-CM | POA: Diagnosis not present

## 2022-05-17 DIAGNOSIS — I2510 Atherosclerotic heart disease of native coronary artery without angina pectoris: Secondary | ICD-10-CM

## 2022-05-17 DIAGNOSIS — R55 Syncope and collapse: Secondary | ICD-10-CM

## 2022-05-17 DIAGNOSIS — E7800 Pure hypercholesterolemia, unspecified: Secondary | ICD-10-CM

## 2022-05-17 DIAGNOSIS — E785 Hyperlipidemia, unspecified: Secondary | ICD-10-CM

## 2022-05-17 DIAGNOSIS — I10 Essential (primary) hypertension: Secondary | ICD-10-CM

## 2022-05-17 NOTE — Patient Instructions (Signed)
Medication Instructions:  Your physician recommends that you continue on your current medications as directed. Please refer to the Current Medication list given to you today.  *If you need a refill on your cardiac medications before your next appointment, please call your pharmacy*  Lab Work: Your physician recommends that you return for lab work TODAY:  BMP CBC  If you have labs (blood work) drawn today and your tests are completely normal, you will receive your results only by: MyChart Message (if you have MyChart) OR A paper copy in the mail If you have any lab test that is abnormal or we need to change your treatment, we will call you to review the results.  Testing/Procedures: NONE ordered at this time of appointment   Follow-Up: At [facility], you and your health needs are our priority.  As part of our continuing mission to provide you with exceptional heart care, we have created designated Provider Care Teams.  These Care Teams include your primary Cardiologist (physician) and Advanced Practice Providers (APPs -  Physician Assistants and Nurse Practitioners) who all work together to provide you with the care you need, when you need it.   Your next appointment:   9-12 month(s)  The format for your next appointment:   In Person  Provider:   Olga Millers, MD  or Edd Fabian, FNP        Other Instructions     Important Information About Sugar

## 2022-05-18 LAB — BASIC METABOLIC PANEL
BUN/Creatinine Ratio: 14 (ref 10–24)
BUN: 12 mg/dL (ref 8–27)
CO2: 23 mmol/L (ref 20–29)
Calcium: 9.2 mg/dL (ref 8.6–10.2)
Chloride: 103 mmol/L (ref 96–106)
Creatinine, Ser: 0.86 mg/dL (ref 0.76–1.27)
Glucose: 124 mg/dL — ABNORMAL HIGH (ref 70–99)
Potassium: 4.8 mmol/L (ref 3.5–5.2)
Sodium: 141 mmol/L (ref 134–144)
eGFR: 97 mL/min/{1.73_m2} (ref >59)

## 2022-05-18 LAB — CBC
Hematocrit: 44.7 % (ref 37.5–51.0)
Hemoglobin: 15.1 g/dL (ref 13.0–17.7)
MCH: 28 pg (ref 26.6–33.0)
MCHC: 33.8 g/dL (ref 31.5–35.7)
MCV: 83 fL (ref 79–97)
Platelets: 240 10*3/uL (ref 150–450)
RBC: 5.4 x10E6/uL (ref 4.14–5.80)
RDW: 12.7 % (ref 11.6–15.4)
WBC: 6.8 10*3/uL (ref 3.4–10.8)

## 2022-05-20 ENCOUNTER — Other Ambulatory Visit: Payer: Self-pay | Admitting: Endocrinology

## 2022-05-20 ENCOUNTER — Other Ambulatory Visit: Payer: Self-pay | Admitting: Internal Medicine

## 2022-06-03 ENCOUNTER — Other Ambulatory Visit: Payer: Self-pay | Admitting: Internal Medicine

## 2022-06-06 ENCOUNTER — Other Ambulatory Visit: Payer: Self-pay | Admitting: Endocrinology

## 2022-06-09 ENCOUNTER — Encounter: Payer: Self-pay | Admitting: Nurse Practitioner

## 2022-06-09 ENCOUNTER — Ambulatory Visit (INDEPENDENT_AMBULATORY_CARE_PROVIDER_SITE_OTHER): Payer: BC Managed Care – PPO | Admitting: Nurse Practitioner

## 2022-06-09 ENCOUNTER — Telehealth: Payer: Self-pay

## 2022-06-09 ENCOUNTER — Encounter: Payer: Self-pay | Admitting: Gastroenterology

## 2022-06-09 VITALS — BP 138/80 | HR 86 | Ht 71.0 in | Wt 218.5 lb

## 2022-06-09 DIAGNOSIS — R1310 Dysphagia, unspecified: Secondary | ICD-10-CM

## 2022-06-09 NOTE — Telephone Encounter (Signed)
Danny [name], you recently saw this patient on 05/17/22. Do you agree that he may proceed for EGD with further cardiac testing?  Thank you, Marcelino Duster

## 2022-06-09 NOTE — Telephone Encounter (Signed)
   Primary Cardiologist: Olga Millers, MD  Chart reviewed as part of pre-operative protocol coverage. Given past medical history and time since last visit, based on ACC/AHA guidelines, RONIE BARNHART would be at acceptable risk for the planned procedure without further cardiovascular testing.   Per office protocol, patient can hold Eliquis for 2 days prior to procedure as requested.   I will route this recommendation to the requesting party via Epic fax function and remove from pre-op pool.  Please call with questions.  Levi Aland, NP-C  06/09/2022, 1:18 PM [address], [address] Office [phone] Fax [phone]

## 2022-06-09 NOTE — Telephone Encounter (Signed)
Patient with diagnosis of afib on Eliquis for anticoagulation.    Procedure: EGD Date of procedure: 06/15/22  CHA2DS2-VASc Score = 3  This indicates a 3.2% annual risk of stroke. The patient's score is based upon: CHF History: 0 HTN History: 1 Diabetes History: 1 Stroke History: 0 Vascular Disease History: 1 Age Score: 0 Gender Score: 0  CrCl >146mL/min Platelet count 240K  Per office protocol, patient can hold Eliquis for 2 days prior to procedure as requested.  **This guidance is not considered finalized until pre-operative APP has relayed final recommendations.**

## 2022-06-09 NOTE — Telephone Encounter (Signed)
[facility] Pre-operative Risk Assessment     Request for surgical clearance:     Endoscopy Procedure  What type of surgery is being performed?     EGD  When is this surgery scheduled?     06-15-22  What type of clearance is required ?   Pharmacy  Are there any medications that need to be held prior to surgery and how long? Yes, Eliquis, 2 days   Practice name and name of physician performing surgery?      [facility]  What is your office phone and fax number?      Phone- [phone]  [phone]  Anesthesia type (None, local, MAC, general) ?       MAC

## 2022-06-09 NOTE — Progress Notes (Signed)
Assessment    Patient profile:  Danny [name] is a 61 y.o. male known to Dr. Lyndel Safe with a past medical history of IBS-D, fatty liver, atrial fibrillation on Eliquis, hypertension, diabetes, BPH, HLD and obesity. See PMH /PSH for additional history  # 61 year old male with solid food dysphagia x 8 months.  No associated GERD symptoms.  No odynophagia. Rule out esophageal stricture, esophagitis (reflux, fungal, EoE).  Rule out esophageal dysmotility.   # Afib, on eliquis  # DM. Takes ozmepic, insulin and Xigduo  Plan   Schedule for EGD. The risks and benefits of EGD with possible biopsies and esophageal dilation were discussed with the patient who agrees to proceed.   Swallowing precautions discussed. Advised to eat slowly, chew food well before swallowing. Drink  liquids in between each bite to avoid food impaction.  Hold Eliquis for 2 days before procedure - will instruct when and how to resume after procedure. Patient understands that there is a low but real risk of cardiovascular event such as heart attack, stroke, or embolism /  thrombosis while off blood thinner. The patient consents to proceed. Will communicate by phone or EMR with patient's prescribing provider to confirm that holding Eliquis is reasonable in this case.    HPI    Chief complaint: problems swallowing food   Danny [name] was last seen in the office 07/21/2021 by Dr. Lyndel Safe, please refer to that note for details.  In summary.  He was seen for lower abdominal pain/bloating.  He has a diagnosis of IBS-D.   Interval History:  Danny [name] had COVID about 8 months ago.  Since then he has been having problems with solid food sticking in his esophagus.  Dysphagia was  intermittent but now occurring with almost every meal.  Problematic foods include but are not limited to bread, apples, meat, and rice.  He chews his food well.  When food hangs up in his esophagus it can take a minute or 2 for the food bolus to pass. No problems  swallowing liquids.  He has unintentionally lost a few pounds.  He believes this is because he has been skipping breakfast because he does not want to get " choked on food".  However, he is on Ozempic. He has no reflux symptoms.  He is a former smoker (he smoked for about 5 years ago but quit 40 years ago).   Labs from 03/30/22 and on 05/17/22 reviewed.  Liver enzymes normal. CBC normal.    Previous GI Evaluation   EGD August 2022 for epigastric pain at [facility] -Mild gastritis, irregular Z-line. -S/p biopsies distal esophagus and gastric.  We do not have biopsy results. -Report sent for scanning.    Colonoscopy 08/07/2017 at [facility]. (Indications-history of polyps, left lower quadrant abdominal pain, diarrhea) -Second-degree hemorrhoids -Otherwise normal colonoscopy -Negative random colon biopsies for microscopic colitis. -Repeat in 5 years -Report sent for scanning.   CT A/P with contrast 06/28/2021 1. No acute CT findings of the abdomen or pelvis to explain lower abdominal pain or bowel symptoms. 2. Hepatomegaly and hepatic steatosis. 3. Coronary artery disease.  Aortic Atherosclerosis (ICD10-I70.0)   Labs:     Latest Ref Rng & Units 05/17/2022   10:34 AM 03/30/2022    9:05 AM 08/24/2021   11:22 AM  CBC  WBC 3.4 - 10.8 x10E3/uL 6.8  6.4  6.3   Hemoglobin 13.0 - 17.7 g/dL 15.1  15.0  16.4   Hematocrit 37.5 - 51.0 % 44.7  44.7  47.6   Platelets 150 - 450 x10E3/uL 240  226.0  278        Latest Ref Rng & Units 03/30/2022    9:05 AM 11/28/2021    9:01 AM 08/02/2021    4:17 PM  Hepatic Function  Total Protein 6.0 - 8.5 g/dL  7.2  7.4   Albumin 3.8 - 4.8 g/dL  4.7  4.8   AST 0 - 37 U/L 25  32  41   ALT 0 - 53 U/L 42  56  56   Alk Phosphatase 44 - 121 IU/L  81  83   Total Bilirubin 0.0 - 1.2 mg/dL  0.4  0.2   Bilirubin, Direct 0.00 - 0.40 mg/dL  0.13       Past Medical History:  Diagnosis Date   Allergic rhinitis    Benign prostatic hypertrophy    s/p TUNA  procedure aprx 2006,   Carpal tunnel syndrome 06/17/2009   Diabetes mellitus    Type II dx 1999   DM type 2 with diabetic peripheral neuropathy ([place]) 10/15/2014   ED (erectile dysfunction)    After TUNA   Elevated LFTs 2010   (-) chronic hep  panel   History of cardiovascular stress test 8/11   Negative   Hyperlipidemia    HYPERSOMNIA UNSPECIFIED 12/11/2007   Hypertension    Obesity    PAF (paroxysmal atrial fibrillation) ([place]) 12/21/2009   a. Failed Multaq. b. Prev on flecainide. c. s/p afib ablation 2015.    Past Surgical History:  Procedure Laterality Date   ABLATION  03/24/2014   PVI by Dr Rayann Heman   ATRIAL FIBRILLATION ABLATION N/A 03/24/2014   Procedure: ATRIAL FIBRILLATION ABLATION;  Surgeon: Coralyn Mark, MD;  Location: [facility] CATH LAB;  Service: Cardiovascular;  Laterality: N/A;   COLONOSCOPY  11/25/2012   Mild sigmoid diverticulosis, small internal hemorrhoids, otherwise normal colonoscopy   CYSTOSCOPY  04/26/2008   neg  @ urology, was rx Flomax   LEFT HEART CATH AND CORONARY ANGIOGRAPHY N/A 08/30/2021   Procedure: LEFT HEART CATH AND CORONARY ANGIOGRAPHY;  Surgeon: Troy Sine, MD;  Location: [facility] CV LAB;  Service: Cardiovascular;  Laterality: N/A;   PROSTATE SURGERY     TUNA   TEE WITHOUT CARDIOVERSION N/A 03/23/2014   Procedure: TRANSESOPHAGEAL ECHOCARDIOGRAM (TEE);  Surgeon: Fay Records, MD;  Location: [facility] ENDOSCOPY;  Service: Cardiovascular;  Laterality: N/A;   VASECTOMY     several years ago    Current Medications, Allergies, Family History and Social History were reviewed in [employer] record.     Current Outpatient Medications  Medication Sig Dispense Refill   acetaminophen (TYLENOL) 650 MG CR tablet Take 650 mg by mouth every 8 (eight) hours as needed for pain.     azelastine (ASTELIN) 0.1 % nasal spray Place 2 sprays into both nostrils 2 (two) times daily. Use in each nostril as directed 30 mL 5   BD VEO INSULIN SYRINGE U/F  31G X 15/64" 0.5 ML MISC USE 1 SYRINGE AS DIRECTED 3 TO 4 TIMES DAILY 100 each 0   Blood Glucose Monitoring Suppl (ONETOUCH VERIO REFLECT) w/Device KIT 1 1 kit 0   Continuous Blood Gluc Sensor (DEXCOM G7 SENSOR) MISC USE AS DIRECTED. CHANGE EVERY 10 DAYS. 3 each 2   dicyclomine (BENTYL) 10 MG capsule Take 1 capsule (10 mg total) by mouth 4 (four) times daily -  before meals and at bedtime. 120 capsule 11   ELIQUIS  5 MG TABS tablet TAKE 1 TABLET(5 MG) BY MOUTH TWICE DAILY 60 tablet 5   fish oil-omega-3 fatty acids 1000 MG capsule Take 2 g by mouth 2 (two) times daily.     fluticasone (FLONASE) 50 MCG/ACT nasal spray SHAKE LIQUID AND USE 2 SPRAYS IN EACH NOSTRIL DAILY AS NEEDED FOR ALLERGIES OR RHINITIS 16 g 5   Glucagon (BAQSIMI ONE PACK) 3 MG/DOSE POWD To be sprayed in nostril as needed for severe hypoglycemia 1 each 1   glucose blood (ONETOUCH VERIO) test strip USE AS DIRECTED TO CHECK BLOOD SUGAR 4 TIMES DAILY 125 each 2   irbesartan (AVAPRO) 150 MG tablet Take 1 tablet (150 mg total) by mouth daily. 90 tablet 3   Lancets (ONETOUCH DELICA PLUS [id number]) MISC Use to check blood sugar 4 times per day 200 each 2   loratadine (CLARITIN) 10 MG tablet Take 10 mg by mouth daily.     Multiple Vitamins-Minerals (MULTIVITAMIN WITH MINERALS) tablet Take 1 tablet by mouth daily.     nitroGLYCERIN (NITROSTAT) 0.4 MG SL tablet DISSOLVE ONE TABLET UNDER THE TONGUE EVERY 5 MINUTES AS NEEDED FOR CHEST PAIN.  DO NOT EXCEED A TOTAL OF 3 DOSES IN 15 MINUTES 25 tablet 11   NOVOLOG FLEXPEN 100 UNIT/ML FlexPen INJECT 10 TO 12 UNITS SUBCUTANEOUSLY THREE TIMES DAILY BEFORE MEAL(S) 15 mL 2   OZEMPIC, 1 MG/DOSE, 4 MG/3ML SOPN INJECT 1 DOSE ONCE A WEEK 3 mL 0   Polyethyl Glyc-Propyl Glyc PF 0.4-0.3 % SOLN Apply 2 drops to eye 3 (three) times daily as needed (for dry eyes).     pregabalin (LYRICA) 75 MG capsule TAKE 1 CAPSULE BY MOUTH THREE TIMES DAILY 90 capsule 0   Probiotic Product (PROBIOTIC DAILY PO) Take 1 capsule by  mouth daily.     rosuvastatin (CRESTOR) 40 MG tablet Take 1 tablet (40 mg total) by mouth daily. 90 tablet 3   tamsulosin (FLOMAX) 0.4 MG CAPS capsule Take 1 capsule (0.4 mg total) by mouth daily. 90 capsule 3   TRESIBA FLEXTOUCH 100 UNIT/ML FlexTouch Pen INJECT 42 UNITS SUBCUTANEOUSLY ONCE DAILY 15 mL 0   XIGDUO XR 10-998 MG TB24 Take 1 tablet by mouth twice daily 180 tablet 0   No current facility-administered medications for this visit.    Review of Systems: No chest pain. No shortness of breath. No urinary complaints.    Physical Exam  Wt Readings from Last 3 Encounters:  05/17/22 217 lb 9.6 oz (98.7 kg)  03/30/22 214 lb 8 oz (97.3 kg)  03/20/22 216 lb (98 kg)    BP 138/80 (BP Location: Left Arm, Patient Position: Sitting, Cuff Size: Normal)   Pulse 86   Ht _0  (1.803 m)   Wt 218 lb 8 oz (99.1 kg)   SpO2 95%   BMI 30.47 kg/m  Constitutional:  Generally well appearing male in no acute distress. Psychiatric: Pleasant. Normal mood and affect. Behavior is normal. EENT: Pupils normal.  Conjunctivae are normal. No scleral icterus. Neck supple.  Cardiovascular: Normal rate  Pulmonary/chest: Effort normal and breath sounds normal. No wheezing, rales or rhonchi. Abdominal: Soft, nondistended, nontender. Bowel sounds active throughout. There are no masses palpable. No hepatomegaly. Neurological: Alert and oriented to person place and time. Musculoskeletal: No edema Skin: Skin is warm and dry. No rashes noted.  Tye Savoy, NP  06/09/2022, 8:09 AM  Cc:  Colon Branch, MD

## 2022-06-09 NOTE — Patient Instructions (Addendum)
_______________________________________________________  If you are age 63 or older, your body mass index should be between 23-30. Your Body mass index is 30.47 kg/m. If this is out of the aforementioned range listed, please consider follow up with your Primary Care Provider.  If you are age 52 or younger, your body mass index should be between 19-25. Your Body mass index is 30.47 kg/m. If this is out of the aformentioned range listed, please consider follow up with your Primary Care Provider.   ________________________________________________________  The [facility] providers would like to encourage you to use [facility] to communicate with providers for non-urgent requests or questions.  Due to long hold times on the telephone, sending your provider a message by [facility] may be a faster and more efficient way to get a response.  Please allow 48 business hours for a response.  Please remember that this is for non-urgent requests.  _______________________________________________________  Bonita Quin have been scheduled for an endoscopy. Please follow written instructions given to you at your visit today. If you use inhalers (even only as needed), please bring them with you on the day of your procedure.  Due to recent changes in healthcare laws, you may see the results of your imaging and laboratory studies on MyChart before your provider has had a chance to review them.  We understand that in some cases there may be results that are confusing or concerning to you. Not all laboratory results come back in the same time frame and the provider may be waiting for multiple results in order to interpret others.  Please give [place] 48 hours in order for your provider to thoroughly review all the results before contacting the office for clarification of your results.   It was a pleasure to see you today!  Thank you for trusting me with your gastrointestinal care!     Marland Kitchen

## 2022-06-11 NOTE — Progress Notes (Signed)
Agree with assessment/plan.  Raj Anthonette Lesage, MD [facility] [phone]  

## 2022-06-13 NOTE — Telephone Encounter (Signed)
Pt has been notified and aware, he has held his Eliquis this morning. No further questions at this time

## 2022-06-15 ENCOUNTER — Ambulatory Visit (AMBULATORY_SURGERY_CENTER): Payer: BC Managed Care – PPO | Admitting: Gastroenterology

## 2022-06-15 ENCOUNTER — Encounter: Payer: Self-pay | Admitting: Gastroenterology

## 2022-06-15 VITALS — BP 111/66 | HR 58 | Temp 97.5°F | Resp 16 | Ht 71.0 in | Wt 218.0 lb

## 2022-06-15 DIAGNOSIS — K2970 Gastritis, unspecified, without bleeding: Secondary | ICD-10-CM

## 2022-06-15 DIAGNOSIS — K31A0 Gastric intestinal metaplasia, unspecified: Secondary | ICD-10-CM

## 2022-06-15 DIAGNOSIS — K219 Gastro-esophageal reflux disease without esophagitis: Secondary | ICD-10-CM | POA: Diagnosis not present

## 2022-06-15 DIAGNOSIS — K2951 Unspecified chronic gastritis with bleeding: Secondary | ICD-10-CM

## 2022-06-15 DIAGNOSIS — R1310 Dysphagia, unspecified: Secondary | ICD-10-CM | POA: Diagnosis not present

## 2022-06-15 DIAGNOSIS — K2101 Gastro-esophageal reflux disease with esophagitis, with bleeding: Secondary | ICD-10-CM

## 2022-06-15 DIAGNOSIS — K222 Esophageal obstruction: Secondary | ICD-10-CM

## 2022-06-15 MED ORDER — PANTOPRAZOLE SODIUM 20 MG PO TBEC: 20.0000 mg | DELAYED_RELEASE_TABLET | Freq: Every day | ORAL | 4 refills | Status: DC

## 2022-06-15 MED ORDER — SODIUM CHLORIDE 0.9 % IV SOLN: 500.0000 mL | Freq: Once | INTRAVENOUS | Status: DC

## 2022-06-15 NOTE — Patient Instructions (Signed)
Take your Eliquis in 2 days.  Take 2 of your protonix once daily 1/2 hour before breakfast every day.  Read all of the handouts given to you by your recovery room nurse.  YOU HAD AN ENDOSCOPIC PROCEDURE TODAY AT THE [facility]:   Refer to the procedure report that was given to you for any specific questions about what was found during the examination.  If the procedure report does not answer your questions, please call your gastroenterologist to clarify.    YOU SHOULD EXPECT: Some feelings of bloating in the abdomen. Passage of more gas than usual.  Walking can help get rid of the air that was put into your GI tract during the procedure and reduce the bloating.   Please Note:  You might notice some irritation and congestion in your nose or some drainage.  This is from the oxygen used during your procedure.  There is no need for concern and it should clear up in a day or so.  SYMPTOMS TO REPORT IMMEDIATELY:    Following upper endoscopy (EGD)  Vomiting of blood or coffee ground material  New chest pain or pain under the shoulder blades  Painful or persistently difficult swallowing  New shortness of breath  Fever of 100F or higher  Black, tarry-looking stools  For urgent or emergent issues, a gastroenterologist can be reached at any hour by calling [phone]. Do not use MyChart messaging for urgent concerns.    DIET:  We do recommend clear liquids until 6:45 pm.   Then a soft diet or the rest of today. You may proceed to your regular diet tomorrow..  Drink plenty of fluids but you should avoid alcoholic beverages for 24 hours.  ACTIVITY:  You should plan to take it easy for the rest of today and you should NOT DRIVE or use heavy machinery until tomorrow (because of the sedation medicines used during the test).    FOLLOW UP: Our staff will call the number listed on your records the next business day following your procedure.  We will call around 7:15- 8:00 am to check on  you and address any questions or concerns that you may have regarding the information given to you following your procedure. If we do not reach you, we will leave a message.     If any biopsies were taken you will be contacted by phone or by letter within the next 1-3 weeks.  Please call us at [phone] if you have not heard about the biopsies in 3 weeks.    SIGNATURES/CONFIDENTIALITY: You and/or your care partner have signed paperwork which will be entered into your electronic medical record.  These signatures attest to the fact that that the information above on your After Visit Summary has been reviewed and is understood.  Full responsibility of the confidentiality of this discharge information lies with you and/or your care-partner.

## 2022-06-15 NOTE — Progress Notes (Signed)
Called to room to assist during endoscopic procedure.  Patient ID and intended procedure confirmed with present staff. Received instructions for my participation in the procedure from the performing physician.  

## 2022-06-15 NOTE — Progress Notes (Signed)
Sedate, gd SR, tolerated procedure well, VSS, report to RN 

## 2022-06-15 NOTE — Op Note (Signed)
[facility] Patient Name: Danny [name] Procedure Date: 06/15/2022 4:10 PM MRN: [id number] Endoscopist: Lynann Bologna , MD, [id number] Age: 61 Referring MD:  Date of Birth: 07-Apr-1961 Gender: Male Account #: [id number] Procedure:                Upper GI endoscopy Indications:              Dysphagia Medicines:                Monitored Anesthesia Care Procedure:                Pre-Anesthesia Assessment:                           - Prior to the procedure, a History and Physical                            was performed, and patient medications and                            allergies were reviewed. The patient's tolerance of                            previous anesthesia was also reviewed. The risks                            and benefits of the procedure and the sedation                            options and risks were discussed with the patient.                            All questions were answered, and informed consent                            was obtained. Prior Anticoagulants: Eliquis was                            held 1 day prior. ASA Grade Assessment: II - A                            patient with mild systemic disease. After reviewing                            the risks and benefits, the patient was deemed in                            satisfactory condition to undergo the procedure.                           After obtaining informed consent, the endoscope was                            passed under direct vision. Throughout the  procedure, the patient's blood pressure, pulse, and                            oxygen saturations were monitored continuously. The                            Endoscope was introduced through the mouth, and                            advanced to the second part of duodenum. The upper                            GI endoscopy was accomplished without difficulty.                            The patient tolerated the  procedure well. Scope In: Scope Out: Findings:                 One benign-appearing, intrinsic mild stenosis was                            found 40 cm from the incisors. This stenosis                            measured 1.2 cm (inner diameter) x less than one cm                            (in length). The stenosis was traversed. The scope                            was withdrawn. Dilation was performed with a                            Maloney dilator with mild resistance at 50 Fr and                            52 Fr. Biopsies were obtained from the proximal and                            distal esophagus with cold forceps for histology of                            suspected eosinophilic esophagitis.                           The Z-line was irregular and was found 40 cm from                            the incisors. Biopsies were taken with a cold                            forceps for histology.  Localized mild inflammation characterized by                            erythema was found in the gastric body and in the                            gastric antrum. Biopsies were taken with a cold                            forceps for histology.                           The examined duodenum was normal. Complications:            No immediate complications. Estimated Blood Loss:     Estimated blood loss: none. Impression:               - Benign-appearing esophageal stenosis. Dilated.                           - Z-line irregular, 40 cm from the incisors.                            Biopsied.                           - Gastritis. Biopsied.                           - Normal examined duodenum.                           - Biopsies were taken with a cold forceps for                            evaluation of eosinophilic esophagitis. Recommendation:           - Patient has a contact number available for                            emergencies. The signs and symptoms of  potential                            delayed complications were discussed with the                            patient. Return to normal activities tomorrow.                            Written discharge instructions were provided to the                            patient.                           - Post dilatation diet.                           -  Continue present medications.                           - Start Protonix 40 mg p.o. daily #90 (half an hour                            before breakfast), 4 RF                           - Await pathology results.                           - Resume Eliquis (apixaban) at prior dose in 2 days.                           - The findings and recommendations were discussed                            with the patient's family. Lynann Bologna, MD 06/15/2022 4:43:48 PM This report has been signed electronically.

## 2022-06-16 ENCOUNTER — Telehealth: Payer: Self-pay | Admitting: *Deleted

## 2022-06-16 ENCOUNTER — Telehealth: Payer: Self-pay | Admitting: Gastroenterology

## 2022-06-16 NOTE — Telephone Encounter (Signed)
Inbound call from patients wife stating that patient was proscribed Protonix yesterday after he had his EGD. Patients wife stated that on the paperwork they were given that patient needed to take 40 mg daily and prescription was written for 20 mg daily. Requesting updated prescription. Please advise.

## 2022-06-16 NOTE — Telephone Encounter (Signed)
  Follow up Call-     06/15/2022    3:05 PM  Call back number  Post procedure Call Back phone  # [phone]  Permission to leave phone message Yes     Patient questions:  Do you have a fever, pain , or abdominal swelling? No. Pain Score  0 *  Have you tolerated food without any problems? Yes.    Have you been able to return to your normal activities? Yes.    Do you have any questions about your discharge instructions: Diet   No. Medications  No. Follow up visit  No.  Do you have questions or concerns about your Care? No.  Actions: * If pain score is 4 or above: No action needed, pain <4.

## 2022-06-21 MED ORDER — PANTOPRAZOLE SODIUM 40 MG PO TBEC: 40.0000 mg | DELAYED_RELEASE_TABLET | Freq: Every day | ORAL | 3 refills | Status: DC

## 2022-06-21 NOTE — Telephone Encounter (Signed)
Changed the Rx to Protonix 40 mg daily and sent to his pharmacy. Pt will D/C the 20 mg daily. He verbalized understanding/

## 2022-06-22 ENCOUNTER — Encounter: Payer: Self-pay | Admitting: Endocrinology

## 2022-06-22 ENCOUNTER — Ambulatory Visit (INDEPENDENT_AMBULATORY_CARE_PROVIDER_SITE_OTHER): Payer: BC Managed Care – PPO | Admitting: Endocrinology

## 2022-06-22 VITALS — BP 124/60 | HR 69 | Ht 71.0 in | Wt 220.0 lb

## 2022-06-22 DIAGNOSIS — E1165 Type 2 diabetes mellitus with hyperglycemia: Secondary | ICD-10-CM | POA: Diagnosis not present

## 2022-06-22 DIAGNOSIS — Z794 Long term (current) use of insulin: Secondary | ICD-10-CM | POA: Diagnosis not present

## 2022-06-22 LAB — POCT GLYCOSYLATED HEMOGLOBIN (HGB A1C): Hemoglobin A1C: 6.4 % — AB (ref 4.0–5.6)

## 2022-06-22 NOTE — Progress Notes (Signed)
Patient ID: Danny [name], male   DOB: April 17, 1961, 61 y.o.   MRN: [id number]    Reason for Appointment: Followup for Type 2 Diabetes  Referring physician: Larose Kells  History of Present Illness:          Diagnosis: Type 2 diabetes mellitus, date of diagnosis: 1992        Past history:  He has had long-standing diabetes and previously was treated with metformin, Amaryl, Januvia and Actos He was taken off metformin because of fear of liver problems.  May have been taken taken off Actos because of swelling of his legs but was on 45 mg at that time Over the last 2 years at least his A1c has been consistently over 8% He was started on insulin in 2011 probably when his A1c was over 9% Initially was given Lantus and Humalog and subsequently switched to 70/30 twice a day for unknown reasons Has not taken Byetta or Victoza in the past He had been on 70/30 insulin initially for a few years Previously A1c was 8% or more consistently before starting Invokamet. He was switched from Lantus to [place] in 1/17  Recent history:   INSULIN regimen is described as:  Tresiba 50 units at 9 pm  Novolog 10 units before meals  Oral hypoglycemic drugs the patient is taking are: Xigduo, 10/998, 1 tablet twice a day, Ozempic 1 mg weekly  His A1c has been usually in the upper normal range, now 6.8 compared to 6.3     Current management, blood sugar patterns and problems identified: He is doing better on 1 mg OZEMPIC which was increased in September Although he was mostly taking 15 units of NovoLog with meals he feels that he does not need it most of the time except when he is not active Generally because of eating only soup mostly in the evenings he will not take any coverage However he is likely not taking NovoLog consistently when eating carbohydrate at lunchtime and not being active Also his new job is less active He is having excellent readings overnight with the same dose of basal insulin  without hypoglycemia As before may rarely have some regular soft drinks causing hypoglycemia at lunch His weight has have gone up slightly Continuing Xigduo        Side effects from medications have been: None  Compliance with the medical regimen: Good   Blood Glucose patterns from [facility] ([place] download for the last 2 weeks  Although he has more variability in the afternoon his blood sugars are generally well-controlled Overnight blood sugars show some variability with average about 120.  No hypoglycemia and did have a couple of falsely low readings with artifacts Premeal readings are excellent at lunch and dinner Postprandial readings are somewhat variable with more tendency to high readings after lunch in the first week of the data and less the last week Also has sporadic high readings after breakfast and dinner also  However highest average of postprandial readings is under 160 No hypoglycemia noted during the day  GMI 6.6  CGM use % of time   2-week average/GV 136/25  Time in range      89%  % Time Above 180 11  % Time above 250   % Time Below 70 0     PRE-MEAL Fasting Lunch Dinner Bedtime Overall  Glucose range:       Averages: 117       POST-MEAL PC Breakfast  PC Lunch PC Dinner  Glucose range:     Averages:  156 156   Previously   CGM use % of time 93  2-week average/GV 134, was 166  Time in range 91 Compared to 76    %  % Time Above 180 8     % Time Below 70 0     Glycemic control:   Lab Results  Component Value Date   HGBA1C 6.4 (A) 06/22/2022   HGBA1C 6.8 (A) 03/20/2022   HGBA1C 6.3 (A) 11/15/2021   Lab Results  Component Value Date   MICROALBUR <0.7 03/20/2022   LDLCALC 4 03/30/2022   CREATININE 0.86 05/17/2022    Self-care:  Meals: 3 meals per day. Breakfast is eggs or cereal usually,  controlling portions      Dietician visit: Most recent: years ago.                 Weight history:  Wt Readings from Last 3 Encounters:  06/22/22 220 lb  (99.8 kg)  06/15/22 218 lb (98.9 kg)  06/09/22 218 lb 8 oz (99.1 kg)    Office Visit on 06/22/2022  Component Date Value Ref Range Status   Hemoglobin A1C 06/22/2022 6.4 (A)  4.0 - 5.6 % Final    Allergies as of 06/22/2022   No Known Allergies      Medication List        Accurate as of June 22, 2022  1:49 PM. If you have any questions, ask your nurse or doctor.          acetaminophen 650 MG CR tablet Commonly known as: TYLENOL Take 650 mg by mouth every 8 (eight) hours as needed for pain.   azelastine 0.1 % nasal spray Commonly known as: ASTELIN Place 2 sprays into both nostrils 2 (two) times daily. Use in each nostril as directed   Baqsimi One Pack 3 MG/DOSE Powd Generic drug: Glucagon To be sprayed in nostril as needed for severe hypoglycemia   BD Veo Insulin Syringe U/F 31G X 15/64" 0.5 ML Misc Generic drug: Insulin Syringe-Needle U-100 USE 1 SYRINGE AS DIRECTED 3 TO 4 TIMES DAILY   Dexcom G7 Sensor Misc USE AS DIRECTED. CHANGE EVERY 10 DAYS.   dicyclomine 10 MG capsule Commonly known as: BENTYL Take 1 capsule (10 mg total) by mouth 4 (four) times daily -  before meals and at bedtime.   Eliquis 5 MG Tabs tablet Generic drug: apixaban TAKE 1 TABLET(5 MG) BY MOUTH TWICE DAILY   fish oil-omega-3 fatty acids 1000 MG capsule Take 2 g by mouth 2 (two) times daily.   fluticasone 50 MCG/ACT nasal spray Commonly known as: FLONASE SHAKE LIQUID AND USE 2 SPRAYS IN EACH NOSTRIL DAILY AS NEEDED FOR ALLERGIES OR RHINITIS   irbesartan 150 MG tablet Commonly known as: AVAPRO Take 1 tablet (150 mg total) by mouth daily.   loratadine 10 MG tablet Commonly known as: CLARITIN Take 10 mg by mouth daily.   multivitamin with minerals tablet Take 1 tablet by mouth daily.   nitroGLYCERIN 0.4 MG SL tablet Commonly known as: NITROSTAT DISSOLVE ONE TABLET UNDER THE TONGUE EVERY 5 MINUTES AS NEEDED FOR CHEST PAIN.  DO NOT EXCEED A TOTAL OF 3 DOSES IN 15 MINUTES    NovoLOG FlexPen 100 UNIT/ML FlexPen Generic drug: insulin aspart INJECT 10 TO 12 UNITS SUBCUTANEOUSLY THREE TIMES DAILY BEFORE MEAL(S)   OneTouch Delica Plus [id number] Misc Use to check blood sugar 4 times per day   OneTouch  Verio Reflect w/Device Kit 1   OneTouch Verio test strip Generic drug: glucose blood USE AS DIRECTED TO CHECK BLOOD SUGAR 4 TIMES DAILY   Ozempic (1 MG/DOSE) 4 MG/3ML Sopn Generic drug: Semaglutide (1 MG/DOSE) INJECT 1 DOSE ONCE A WEEK   pantoprazole 40 MG tablet Commonly known as: PROTONIX Take 1 tablet (40 mg total) by mouth daily. Take 30 minutes prior to eating.   Polyethyl Glyc-Propyl Glyc PF 0.4-0.3 % Soln Apply 2 drops to eye 3 (three) times daily as needed (for dry eyes).   pregabalin 75 MG capsule Commonly known as: LYRICA TAKE 1 CAPSULE BY MOUTH THREE TIMES DAILY   PROBIOTIC DAILY PO Take 1 capsule by mouth daily.   rosuvastatin 40 MG tablet Commonly known as: CRESTOR Take 1 tablet (40 mg total) by mouth daily.   silodosin 8 MG Caps capsule Commonly known as: RAPAFLO Take 8 mg by mouth daily with breakfast.   Tyler Aas FlexTouch 100 UNIT/ML FlexTouch Pen Generic drug: insulin degludec INJECT 42 UNITS SUBCUTANEOUSLY ONCE DAILY What changed: See the new instructions.   Xigduo XR 10-998 MG Tb24 Generic drug: Dapagliflozin Pro-metFORMIN ER Take 1 tablet by mouth twice daily        Allergies:  No Known Allergies  Past Medical History:  Diagnosis Date   Allergic rhinitis    Benign prostatic hypertrophy    s/p TUNA procedure aprx 2006,   Carpal tunnel syndrome 06/17/2009   Diabetes mellitus    Type II dx 1999   DM type 2 with diabetic peripheral neuropathy ([place]) 10/15/2014   ED (erectile dysfunction)    After TUNA   Elevated LFTs 2010   (-) chronic hep  panel   History of cardiovascular stress test 8/11   Negative   Hyperlipidemia    HYPERSOMNIA UNSPECIFIED 12/11/2007   Hypertension    Obesity    PAF (paroxysmal atrial  fibrillation) ([place]) 12/21/2009   a. Failed Multaq. b. Prev on flecainide. c. s/p afib ablation 2015.    Past Surgical History:  Procedure Laterality Date   ABLATION  03/24/2014   PVI by Dr Rayann Heman   ATRIAL FIBRILLATION ABLATION N/A 03/24/2014   Procedure: ATRIAL FIBRILLATION ABLATION;  Surgeon: Coralyn Mark, MD;  Location: [place] CATH LAB;  Service: Cardiovascular;  Laterality: N/A;   COLONOSCOPY  11/25/2012   Mild sigmoid diverticulosis, small internal hemorrhoids, otherwise normal colonoscopy   CYSTOSCOPY  04/26/2008   neg  @ urology, was rx Flomax   LEFT HEART CATH AND CORONARY ANGIOGRAPHY N/A 08/30/2021   Procedure: LEFT HEART CATH AND CORONARY ANGIOGRAPHY;  Surgeon: Troy Sine, MD;  Location: [place] CV LAB;  Service: Cardiovascular;  Laterality: N/A;   PROSTATE SURGERY     TUNA   TEE WITHOUT CARDIOVERSION N/A 03/23/2014   Procedure: TRANSESOPHAGEAL ECHOCARDIOGRAM (TEE);  Surgeon: Fay Records, MD;  Location: [facility] ENDOSCOPY;  Service: Cardiovascular;  Laterality: N/A;   VASECTOMY     several years ago    Family History  Problem Relation Age of Onset   Heart disease Mother    Diabetes Mother    Hypertension Mother    Congestive Heart Failure Mother    Heart attack Mother    Hypertension Father    Colon cancer Neg Hx    Prostate cancer Neg Hx     Social History:  reports that he quit smoking about 37 years ago. His smoking use included cigarettes. He has never used smokeless tobacco. He reports that he does not drink alcohol and  does not use drugs.    Review of Systems       Lipids: He has been on Crestor 40 mg and also followed by cardiology  On bid fish oil for high triglycerides  Labs as below:      Lab Results  Component Value Date   CHOL 63 03/30/2022   HDL 33.90 (L) 03/30/2022   [place] 4 03/30/2022   LDLDIRECT 62.0 03/29/2021   TRIG 125.0 03/30/2022   CHOLHDL 2 03/30/2022                Hypertension, mild  Treated with Avapro from his  cardiologist  BP Readings from Last 3 Encounters:  06/22/22 124/60  06/15/22 111/66  06/09/22 138/80      Neuropathy: Has longstanding history of Numbness, pins-and-needle sensation and stinging in feet and lower legs He has been followed by neurologist, taking 75 mg, 3 daily of Lyrica Symptoms are mostly controlled  No sensory loss on exam     LABS:  Office Visit on 06/22/2022  Component Date Value Ref Range Status   Hemoglobin A1C 06/22/2022 6.4 (A)  4.0 - 5.6 % Final    Physical Examination:  BP 124/60   Pulse 69   Ht _0  (1.803 m)   Wt 220 lb (99.8 kg)   SpO2 96%   BMI 30.68 kg/m    ASSESSMENT/PLAN:    Diabetes type 2 on insulin  See history of present illness for detailed discussion of current diabetes management, blood sugar patterns and problems identified  His A1c is 6.4  He is on basal bolus insulin with Ozempic 1 mg and Xigduo  He is again able to cut back significantly on mealtime insulin with increasing his Ozempic With this he has better satiety Discussed abnormal patterns of his blood sugars and higher readings may be related to inconsistent diet or less activity recently  Plan:  No change in general management plan Reminded him to take NovoLog to cover his lunch more consistently when not being active and getting carbohydrates such as sandwiches No change in Ozempic dose as it appears to be effective now at 1 mg  There are no Patient Instructions on file for this visit.      Elayne Snare 06/22/2022, 1:49 PM   Note: This office note was prepared with Dragon voice recognition system technology. Any transcriptional errors that result from this process are unintentional. .

## 2022-06-23 ENCOUNTER — Other Ambulatory Visit: Payer: Self-pay | Admitting: Endocrinology

## 2022-06-23 ENCOUNTER — Other Ambulatory Visit: Payer: Self-pay | Admitting: Cardiology

## 2022-06-23 DIAGNOSIS — E785 Hyperlipidemia, unspecified: Secondary | ICD-10-CM

## 2022-06-25 ENCOUNTER — Other Ambulatory Visit: Payer: Self-pay | Admitting: Endocrinology

## 2022-06-28 ENCOUNTER — Encounter: Payer: Self-pay | Admitting: Endocrinology

## 2022-07-01 ENCOUNTER — Other Ambulatory Visit: Payer: Self-pay | Admitting: Gastroenterology

## 2022-07-01 ENCOUNTER — Other Ambulatory Visit: Payer: Self-pay | Admitting: Family

## 2022-07-02 ENCOUNTER — Encounter: Payer: Self-pay | Admitting: Gastroenterology

## 2022-07-06 ENCOUNTER — Telehealth: Payer: Self-pay

## 2022-07-06 MED ORDER — PREGABALIN 75 MG PO CAPS: 75.0000 mg | ORAL_CAPSULE | Freq: Three times a day (TID) | ORAL | 10 refills | Status: DC

## 2022-07-06 NOTE — Telephone Encounter (Signed)
PDMP okay, Rx sent 

## 2022-07-06 NOTE — Telephone Encounter (Signed)
Requesting: Lyrica 75mg   Contract: 01/26/21 UDS: None  Last Visit: 03/30/22 Next Visit: 04/02/23 Last Refill: 06/04/22 #90 and 0RF   Please Advise

## 2022-07-12 ENCOUNTER — Other Ambulatory Visit: Payer: Self-pay | Admitting: Endocrinology

## 2022-07-26 ENCOUNTER — Other Ambulatory Visit: Payer: Self-pay | Admitting: Endocrinology

## 2022-08-01 ENCOUNTER — Ambulatory Visit: Payer: BC Managed Care – PPO | Admitting: Podiatry

## 2022-08-01 DIAGNOSIS — L84 Corns and callosities: Secondary | ICD-10-CM

## 2022-08-01 DIAGNOSIS — M79674 Pain in right toe(s): Secondary | ICD-10-CM | POA: Diagnosis not present

## 2022-08-01 DIAGNOSIS — M79675 Pain in left toe(s): Secondary | ICD-10-CM

## 2022-08-01 DIAGNOSIS — L600 Ingrowing nail: Secondary | ICD-10-CM

## 2022-08-01 DIAGNOSIS — Z794 Long term (current) use of insulin: Secondary | ICD-10-CM

## 2022-08-01 DIAGNOSIS — B351 Tinea unguium: Secondary | ICD-10-CM | POA: Diagnosis not present

## 2022-08-01 DIAGNOSIS — E0840 Diabetes mellitus due to underlying condition with diabetic neuropathy, unspecified: Secondary | ICD-10-CM

## 2022-08-01 NOTE — Progress Notes (Signed)
  Subjective:  Patient ID: Danny [name], male    DOB: 01-Feb-1961,  MRN: [id number]  Chief Complaint  Patient presents with   Nail Problem    Diabetic Foot Care     62 y.o. male presents with the above complaint. History confirmed with patient. Patient presenting with pain related to dystrophic thickened elongated nails. Patient is unable to trim own nails related to nail dystrophy and/or mobility issues. Patient does  have a history of T2DM. Patient does have callus present located at the plantar 1st MPJ left foot as well as the fifth metatarsal head bilaterally.  He is monitoring the left hallux nail it is not painful for now we will consider future nail removal procedure if needed on the left hallux nail.  Objective:  Physical Exam: warm, good capillary refill nail exam onychomycosis of the toenails, onycholysis, and dystrophic nails DP pulses palpable, PT pulses palpable, and protective sensation intact Left Foot:  Pain with palpation of nails due to elongation and dystrophic growth.  Hyperkeratotic lesion sub one and fifth metatarsal heads. Right Foot: Pain with palpation of nails due to elongation and dystrophic growth.  Right foot with hyperkeratotic lesion subfifth metatarsal head  Assessment:   1. Pain due to onychomycosis of toenails of both feet   2. Pre-ulcerative calluses   3. Ingrown nail   4. Diabetes mellitus due to underlying condition, controlled, with diabetic neuropathy, with long-term current use of insulin ([place])       Plan:  Patient was evaluated and treated and all questions answered.  #All symptomatic hyperkeratoses x3 lesions located at the plantar aspect of the left first metatarsal head left fifth metatarsal head and right fifth metatarsal head were safely debrided with a sterile #15 blade to patient's level of comfort without incident. We discussed preventative and palliative care of these lesions including supportive and accommodative shoegear, padding,  prefabricated and custom molded accommodative orthoses, use of a pumice stone and lotions/creams daily.   #Onychomycosis with pain  -Nails palliatively debrided as below. -Educated on self-care -Will monitor Left hallux nail ingrown for now, may need total permanent hallux nail P&A procedure in future.   Procedure: Nail Debridement Rationale: Pain Type of Debridement: manual, sharp debridement. Instrumentation: Nail nipper, rotary burr. Number of Nails: 10  Return in about 3 months (around 10/30/2022) for [facility].         Everitt Amber, DPM [facility]

## 2022-08-17 ENCOUNTER — Other Ambulatory Visit: Payer: Self-pay | Admitting: Endocrinology

## 2022-09-11 ENCOUNTER — Other Ambulatory Visit: Payer: Self-pay | Admitting: Student

## 2022-09-14 ENCOUNTER — Other Ambulatory Visit: Payer: Self-pay | Admitting: Cardiology

## 2022-09-15 ENCOUNTER — Other Ambulatory Visit: Payer: Self-pay | Admitting: Cardiology

## 2022-09-15 DIAGNOSIS — I480 Paroxysmal atrial fibrillation: Secondary | ICD-10-CM

## 2022-09-15 NOTE — Telephone Encounter (Signed)
Prescription refill request for Eliquis received.  Indication: afib  Last office visit: [place], 05/17/2022 Scr: 0.86, 05/17/2022 Age: 62 yo  Weight:  99.8 kg   Refill sent.

## 2022-09-22 ENCOUNTER — Other Ambulatory Visit: Payer: Self-pay | Admitting: Endocrinology

## 2022-10-06 ENCOUNTER — Encounter: Payer: Self-pay | Admitting: Internal Medicine

## 2022-10-09 ENCOUNTER — Other Ambulatory Visit (INDEPENDENT_AMBULATORY_CARE_PROVIDER_SITE_OTHER): Payer: BC Managed Care – PPO

## 2022-10-09 ENCOUNTER — Ambulatory Visit: Payer: BC Managed Care – PPO | Admitting: Nurse Practitioner

## 2022-10-09 ENCOUNTER — Encounter: Payer: Self-pay | Admitting: Nurse Practitioner

## 2022-10-09 VITALS — BP 120/68 | HR 77 | Ht 71.0 in | Wt 223.0 lb

## 2022-10-09 DIAGNOSIS — R1032 Left lower quadrant pain: Secondary | ICD-10-CM

## 2022-10-09 DIAGNOSIS — K5900 Constipation, unspecified: Secondary | ICD-10-CM | POA: Diagnosis not present

## 2022-10-09 DIAGNOSIS — K5790 Diverticulosis of intestine, part unspecified, without perforation or abscess without bleeding: Secondary | ICD-10-CM

## 2022-10-09 LAB — COMPREHENSIVE METABOLIC PANEL
ALT: 50 U/L (ref 0–53)
AST: 30 U/L (ref 0–37)
Albumin: 4.3 g/dL (ref 3.5–5.2)
Alkaline Phosphatase: 80 U/L (ref 39–117)
BUN: 15 mg/dL (ref 6–23)
CO2: 26 mEq/L (ref 19–32)
Calcium: 9.2 mg/dL (ref 8.4–10.5)
Chloride: 104 mEq/L (ref 96–112)
Creatinine, Ser: 0.99 mg/dL (ref 0.40–1.50)
GFR: 80.94 mL/min (ref >60.00)
Glucose, Bld: 138 mg/dL — ABNORMAL HIGH (ref 70–99)
Potassium: 4.4 mEq/L (ref 3.5–5.1)
Sodium: 140 mEq/L (ref 135–145)
Total Bilirubin: 0.4 mg/dL (ref 0.2–1.2)
Total Protein: 7.1 g/dL (ref 6.0–8.3)

## 2022-10-09 LAB — CBC WITH DIFFERENTIAL/PLATELET
Basophils Absolute: 0 10*3/uL (ref 0.0–0.1)
Basophils Relative: 0.6 % (ref 0.0–3.0)
Eosinophils Absolute: 0.3 10*3/uL (ref 0.0–0.7)
Eosinophils Relative: 4.9 % (ref 0.0–5.0)
HCT: 45.1 % (ref 39.0–52.0)
Hemoglobin: 15.2 g/dL (ref 13.0–17.0)
Lymphocytes Relative: 30 % (ref 12.0–46.0)
Lymphs Abs: 2 10*3/uL (ref 0.7–4.0)
MCHC: 33.7 g/dL (ref 30.0–36.0)
MCV: 82.7 fl (ref 78.0–100.0)
Monocytes Absolute: 0.6 10*3/uL (ref 0.1–1.0)
Monocytes Relative: 9.2 % (ref 3.0–12.0)
Neutro Abs: 3.7 10*3/uL (ref 1.4–7.7)
Neutrophils Relative %: 55.3 % (ref 43.0–77.0)
Platelets: 243 10*3/uL (ref 150.0–400.0)
RBC: 5.46 Mil/uL (ref 4.22–5.81)
RDW: 14.3 % (ref 11.5–15.5)
WBC: 6.8 10*3/uL (ref 4.0–10.5)

## 2022-10-09 LAB — C-REACTIVE PROTEIN: CRP: <1 mg/dL (ref 0.5–20.0)

## 2022-10-09 NOTE — Progress Notes (Signed)
10/09/2022 Danny [name] [id number] July 07, 1960   Chief Complaint: LLQ abdominal pain   History of Present Illness: Danny [name] is a 62 year old male with past medical history of hypertension, hyperlipidemia, atrial fibrillation on Eliquis, DM type II, obesity hepatic steatosis, GERD and colon polyps.  He presents today with complaints of left lower quadrant abdominal pain with associated constipation for the past 2 weeks.  He typically passed a normal formed brown bowel movement most days, however, over the past 2 weeks he passed small amounts of nonbloody mushy stools and felt significantly constipated.  He took a vegetable laxative for few days last week and today he passed a large amount of solid stool and his abdominal pain abated.  He denies having any abdominal pain at this time.  No recent fevers.  Denies ever having diverticulitis.  He endorses undergoing at least 5 colonoscopies in his lifetime.  His most recent colonoscopy was by a GI in [facility] 08/07/2017 which was normal.  He has a history of dysphagia.  His most recent EGD was 06/15/2022 which showed a benign-appearing esophageal stenosis which was dilated, reflux esophagitis and gastritis.  Esophageal biopsies were negative for Barrett's esophagus.  Gastric biopsies were consistent with reactive gastropathy with chronic gastritis and focal intestinal metaplasia without evidence of H. pylori or dysplasia.  He denies having any further food dysphagia but chronically has pill dysphagia.  No heartburn or upper abdominal pain.  He remains on Pantoprazole 40 mg daily.  EGD 06/15/2022 by Dr. Chales Abrahams: - Benign-appearing esophageal stenosis. Dilated.  - Z-line irregular, 40 cm from the incisors. Biopsied.  - Gastritis. Biopsied.  - Normal examined duodenum.  - Biopsies were taken with a cold forceps for evaluation of eosinophilic esophagitis 1. 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 Surgical [P], distal esophagus REFLUX ESOPHAGITIS (6 EOS/HIGH POWER FIELD) SCANT CARDIA TYPE GASTRIC MUCOSA AND FOVEOLAR EPITHELIUM SHOWING MILD CHRONIC GASTRITIS NEGATIVE FOR INTESTINAL METAPLASIA, DYSPLASIA AND CARCINOMA 3. Surgical [P], proximal and mid esophagus REACTIVE SQUAMOUS MUCOSA NEGATIVE FOR GLANDULAR EPITHELIUM, EOSINOPHILS, DYSPLASIA AND CARCINOMA  Colonoscopy 08/07/2017: Normal colonoscopy  Internal hemorrhoids Repeat colonoscopy in 5 years  Colonoscopy 11/25/2012 by Dr. Chales Abrahams: Mild sigmoid diverticulosis Small internal hemorrhoids the most likely etiology for bright red blood per rectum Otherwise normal colonoscopy Due to prior history of colon polyps, recommend repeating colonoscopy in 5 years, earlier if he starts having any new problems  Current Outpatient Medications on File Prior to Visit  Medication Sig Dispense Refill   acetaminophen (TYLENOL) 650 MG CR tablet Take 650 mg by mouth every 8 (eight) hours as needed for pain.     azelastine (ASTELIN) 0.1 % nasal spray Place 2 sprays into both nostrils 2 (two) times daily. Use in each nostril as directed 30 mL 5   BD VEO INSULIN SYRINGE U/F 31G X 15/64" 0.5 ML MISC USE 1 SYRINGE AS DIRECTED 3 TO 4 TIMES DAILY 100 each 0   Blood Glucose Monitoring Suppl (ONETOUCH VERIO REFLECT) w/Device KIT 1 1 kit 0   Continuous Blood Gluc Sensor (DEXCOM G7 SENSOR) MISC USE AS DIRECTED. CHANGE EVERY 10 DAYS 3 each 3   dicyclomine (BENTYL) 10 MG capsule TAKE 1 CAPSULE(10 MG) BY MOUTH FOUR TIMES DAILY BEFORE MEALS AND AT BEDTIME 120 capsule 11   ELIQUIS 5 MG TABS tablet TAKE 1 TABLET(5 MG) BY MOUTH TWICE DAILY 60 tablet  5   fish oil-omega-3 fatty acids 1000 MG capsule Take 2 g by mouth 2 (two) times daily.     fluticasone (FLONASE) 50 MCG/ACT nasal spray SHAKE LIQUID AND USE 2 SPRAYS IN EACH NOSTRIL DAILY AS NEEDED FOR  ALLERGIES OR RHINITIS 16 g 5   Glucagon (BAQSIMI ONE PACK) 3 MG/DOSE POWD To be sprayed in nostril as needed for severe hypoglycemia 1 each 1   glucose blood (ONETOUCH VERIO) test strip USE AS DIRECTED TO CHECK BLOOD SUGAR 4 TIMES DAILY 125 each 2   irbesartan (AVAPRO) 150 MG tablet Take 1 tablet by mouth once daily 90 tablet 2   Lancets (ONETOUCH DELICA PLUS LANCET33G) MISC Use to check blood sugar 4 times per day 200 each 2   loratadine (CLARITIN) 10 MG tablet Take 10 mg by mouth daily.     Multiple Vitamins-Minerals (MULTIVITAMIN WITH MINERALS) tablet Take 1 tablet by mouth daily.     nitroGLYCERIN (NITROSTAT) 0.4 MG SL tablet DISSOLVE ONE TABLET UNDER THE TONGUE EVERY 5 MINUTES AS NEEDED FOR CHEST PAIN.  DO NOT EXCEED A TOTAL OF 3 DOSES IN 15 MINUTES 25 tablet 0   NOVOLOG FLEXPEN 100 UNIT/ML FlexPen INJECT 10 TO 12 UNITS SUBCUTANEOUSLY THREE TIMES DAILY BEFORE MEAL(S) 15 mL 0   pantoprazole (PROTONIX) 40 MG tablet Take 1 tablet (40 mg total) by mouth daily. Take 30 minutes prior to eating. 90 tablet 3   Polyethyl Glyc-Propyl Glyc PF 0.4-0.3 % SOLN Apply 2 drops to eye 3 (three) times daily as needed (for dry eyes).     pregabalin (LYRICA) 75 MG capsule Take 1 capsule (75 mg total) by mouth 3 (three) times daily. 90 capsule 10   Probiotic Product (PROBIOTIC DAILY PO) Take 1 capsule by mouth daily.     rosuvastatin (CRESTOR) 40 MG tablet Take 1 tablet by mouth once daily 90 tablet 3   Semaglutide, 1 MG/DOSE, (OZEMPIC, 1 MG/DOSE,) 4 MG/3ML SOPN INJECT 1 MG INTO THE SKIN  ONCE A WEEK 3 mL 2   silodosin (RAPAFLO) 8 MG CAPS capsule Take 8 mg by mouth daily with breakfast.     TRESIBA FLEXTOUCH 100 UNIT/ML FlexTouch Pen Inject 50 units daily 15 mL 2   XIGDUO XR 10-998 MG TB24 Take 1 tablet by mouth twice daily 180 tablet 0   Current Facility-Administered Medications on File Prior to Visit  Medication Dose Route Frequency Provider Last Rate Last Admin   0.9 %  sodium chloride infusion  500 mL  Intravenous Once Lynann Bologna, MD       No Known Allergies  Current Medications, Allergies, Past Medical History, Past Surgical History, Family History and Social History were reviewed in [employer] record.  Review of Systems:   Constitutional: Negative for fever, sweats, chills or weight loss.  Respiratory: Negative for shortness of breath.   Cardiovascular: Negative for chest pain, palpitations and leg swelling.  Gastrointestinal: See HPI.  Musculoskeletal: Negative for back pain or muscle aches.  Neurological: Negative for dizziness, headaches or paresthesias.   Physical Exam: BP 120/68   Pulse 77   Ht  (1.803 m)   Wt 223 lb (101.2 kg)   BMI 31.10 kg/m  General: 62 year old male in no acute distress. Head: Normocephalic and atraumatic. Eyes: No scleral icterus. Conjunctiva pink . Ears: Normal auditory acuity. Mouth: Dentition intact. No ulcers or lesions.  Lungs: Clear throughout to auscultation. Heart: Regular rate and rhythm, no murmur. Abdomen: Soft, nontender and nondistended. No masses or hepatomegaly.  Normal bowel sounds x 4 quadrants.  Rectal: Deferred. Musculoskeletal: Symmetrical with no gross deformities. Extremities: No edema. Neurological: Alert oriented x 4. No focal deficits.  Psychological: Alert and cooperative. Normal mood and affect  Assessment and Recommendations:  62 year old male with a history of diverticulosis with LLQ pain x 2 weeks which abated today after he passed a large solid bowel movement.  Normal abdominal exam today. -CBC, CMP and CRP -CTAP with oral and IV contrast if LLQ pain recurs -Patient will contact our office if LLQ pain recurs -Avoid constipation, see instructions below -Patient is due for a colonoscopy 11/2022 (due to prior history of colon polyps), if no signs of developing diverticulitis over the next few days, I will provide orders for a colonoscopy with Dr. Chales Abrahams June  2024  Constipation -MiraLAX nightly -Rink 6 to 8 glasses of water daily -Dietary fiber as tolerated  History of colon polyps.  No colon polyps per colonoscopy 08/15/2017 and 12/13/2012.  Patient previously recommended to undergo colonoscopy 11/2022. -See note above regarding scheduling a colonoscopy 11/2022  History of GERD, benign esophageal stenosis with past dysphagia with solid foods with ongoing intermittent pill dysphagia.  EGD 06/15/2022 showed a benign esophageal stenosis which was dilated, reflux esophagitis without Barrett's esophagus and chronic gastritis with intestinal metaplasia.   -Patient to contact our office if dysphagia with solid foods recurs -Continue Pantoprazole 40 mg daily -Dr. Chales Abrahams to verify if a recall EGD is required due to gastric biopsies which showed focal intestinal metaplasia  DM type II

## 2022-10-09 NOTE — Patient Instructions (Signed)
Your provider has requested that you go to the basement level for lab work before leaving today. Press "B" on the elevator. The lab is located at the first door on the left as you exit the elevator.  Contact our office if left lower quadrant pain recurs.  Go to the ER if you devlop severe abdominal pain.  Miralax- every night as needed.  Due to recent changes in healthcare laws, you may see the results of your imaging and laboratory studies on MyChart before your provider has had a chance to review them.  We understand that in some cases there may be results that are confusing or concerning to you. Not all laboratory results come back in the same time frame and the provider may be waiting for multiple results in order to interpret others.  Please give [place] 48 hours in order for your provider to thoroughly review all the results before contacting the office for clarification of your results.   Thank you for trusting me with your gastrointestinal care!   Alcide Evener, CRNP

## 2022-10-18 ENCOUNTER — Other Ambulatory Visit: Payer: Self-pay | Admitting: Endocrinology

## 2022-10-18 DIAGNOSIS — E1142 Type 2 diabetes mellitus with diabetic polyneuropathy: Secondary | ICD-10-CM

## 2022-10-18 NOTE — Progress Notes (Signed)
Agree with assessment/plan. If pain-free, proceed with colonoscopy Repeat EGD in 3 years with gastric mapping (for evaluation of intestinal metaplasia)  Edman Circle, MD [facility] [phone]

## 2022-10-19 NOTE — Progress Notes (Signed)
Danny [name], pls contact patient and schedule him for a colonoscopy with Dr. Chales Abrahams June 2024. Patient to contact office if his abd pain recurs prior to his colonoscopy date. Pls enter EGD recall 05/2025. THX.

## 2022-10-20 ENCOUNTER — Telehealth: Payer: Self-pay

## 2022-10-20 ENCOUNTER — Other Ambulatory Visit: Payer: Self-pay

## 2022-10-20 ENCOUNTER — Other Ambulatory Visit (INDEPENDENT_AMBULATORY_CARE_PROVIDER_SITE_OTHER): Payer: BC Managed Care – PPO

## 2022-10-20 DIAGNOSIS — E1142 Type 2 diabetes mellitus with diabetic polyneuropathy: Secondary | ICD-10-CM | POA: Diagnosis not present

## 2022-10-20 LAB — HEMOGLOBIN A1C: Hgb A1c MFr Bld: 6.8 % — ABNORMAL HIGH (ref 4.6–6.5)

## 2022-10-20 LAB — GLUCOSE, RANDOM: Glucose, Bld: 99 mg/dL (ref 70–99)

## 2022-10-20 NOTE — Telephone Encounter (Signed)
Author: Arnaldo Natal, NP Service: Gastroenterology Author Type: Nurse Practitioner  Filed: 10/19/2022  5:29 AM Encounter Date: 10/09/2022 Status: Signed  Editor: Arnaldo Natal, NP (Nurse Practitioner)   Viviann Spare, pls contact patient and schedule him for a colonoscopy with Dr. Chales Abrahams June 2024. Patient to contact office if his abd pain recurs prior to his colonoscopy date. Pls enter EGD recall 05/2025. THX.     Left message for patient to call back.

## 2022-10-24 ENCOUNTER — Ambulatory Visit: Payer: BC Managed Care – PPO | Admitting: Endocrinology

## 2022-10-24 ENCOUNTER — Other Ambulatory Visit: Payer: Self-pay

## 2022-10-24 ENCOUNTER — Encounter: Payer: Self-pay | Admitting: Endocrinology

## 2022-10-24 ENCOUNTER — Telehealth: Payer: Self-pay

## 2022-10-24 VITALS — BP 120/58 | HR 80 | Ht 71.0 in | Wt 223.0 lb

## 2022-10-24 DIAGNOSIS — I10 Essential (primary) hypertension: Secondary | ICD-10-CM | POA: Diagnosis not present

## 2022-10-24 DIAGNOSIS — E1165 Type 2 diabetes mellitus with hyperglycemia: Secondary | ICD-10-CM

## 2022-10-24 DIAGNOSIS — Z794 Long term (current) use of insulin: Secondary | ICD-10-CM

## 2022-10-24 DIAGNOSIS — Z86010 Personal history of colonic polyps: Secondary | ICD-10-CM

## 2022-10-24 NOTE — Telephone Encounter (Signed)
Patient agreeable and with virtual appointment. Medication list and reviewed and consent given.

## 2022-10-24 NOTE — Telephone Encounter (Signed)
[facility] Pre-operative Risk Assessment     Request for surgical clearance:     Endoscopy Procedure  What type of surgery is being performed?     Colonoscopy  When is this surgery scheduled?     12/25/2022  What type of clearance is required ?   Medical  Are there any medications that need to be held prior to surgery and how long? Eliquis 2 days  Practice name and name of physician performing surgery?      [facility]/ Dr. Chales Abrahams  What is your office phone and fax number?      Phone- [phone]  Fax- [phone]  Anesthesia type (None, local, MAC, general) ?       MAC

## 2022-10-24 NOTE — Telephone Encounter (Signed)
Patient with diagnosis of afib on Eliquis for anticoagulation.    Procedure: colonoscopy Date of procedure: 12/25/22  CHA2DS2-VASc Score = 3  This indicates a 3.2% annual risk of stroke. The patient's score is based upon: CHF History: 0 HTN History: 1 Diabetes History: 1 Stroke History: 0 Vascular Disease History: 1 Age Score: 0 Gender Score: 0   CrCl 13mL/min using adj body weight Platelet count 243K  Per office protocol, patient can hold Eliquis for 2 days prior to procedure as requested.    **This guidance is not considered finalized until pre-operative APP has relayed final recommendations.**

## 2022-10-24 NOTE — Telephone Encounter (Signed)
   Name: Danny [name]  DOB: Oct 10, 1960  MRN: [id number]  Primary Cardiologist: Olga Millers, MD   Preoperative team, please contact this patient and set up a phone call appointment for further preoperative risk assessment. Please obtain consent and complete medication review. Thank you for your help.  I confirm that guidance regarding antiplatelet and oral anticoagulation therapy has been completed and, if necessary, noted below.  Per office protocol, patient can hold Eliquis for 2 days prior to procedure as requested.    Napoleon Form, Leodis Rains, NP 10/24/2022, 4:10 PM [facility]

## 2022-10-24 NOTE — Patient Instructions (Signed)
Take 10 Novolog if drinking Coke

## 2022-10-24 NOTE — Telephone Encounter (Signed)
Pharmacy please advise on holding Eliquis prior to colonoscopy scheduled for 7 1/24. Thank you.

## 2022-10-24 NOTE — Telephone Encounter (Signed)
Pt was notified of Dr. Chales Abrahams and Alcide Evener NP recommendations: Pt was scheduled for a colonoscopy on 12/25/2022 at 2:30 PM  with Dr. Chales Abrahams in the [facility]. Pt made aware. Pt notified to contact office if his abd pain recurs prior to his colonoscopy date. Ambulatory referral to GI placed in Epic.  Prep instructions sent to pt via my chart. Pt made aware. Pt does take Eliquis. Clearance sent to pre op pool. Pt made aware that they will contact him about holding his Eliquis prior to his procedure.  Pt notified that if he has not heard anything from them 10 days prior to the date of his procedure then to please contact our office.  EGD recall placed in epic for 05/2025 Pt verbalized understanding with all questions answered.

## 2022-10-24 NOTE — Progress Notes (Signed)
Patient ID: Danny [name], male   DOB: 1961/06/08, 62 y.o.   MRN: [id number]    Reason for Appointment: Followup for Type 2 Diabetes  Referring physician: Drue Novel  History of Present Illness:          Diagnosis: Type 2 diabetes mellitus, date of diagnosis: 1992        Past history:  He has had long-standing diabetes and previously was treated with metformin, Amaryl, Januvia and Actos He was taken off metformin because of fear of liver problems.  May have been taken taken off Actos because of swelling of his legs but was on 45 mg at that time Over the last 2 years at least his A1c has been consistently over 8% He was started on insulin in 2011 probably when his A1c was over 9% Initially was given Lantus and Humalog and subsequently switched to 70/30 twice a day for unknown reasons Has not taken Byetta or Victoza in the past He had been on 70/30 insulin initially for a few years Previously A1c was 8% or more consistently before starting Invokamet. He was switched from Lantus to [place] in 1/17  Recent history:   INSULIN regimen is described as:  Tresiba 50 units at 9 pm  Novolog 15 units before meals as needed  Oral hypoglycemic drugs the patient is taking are: Xigduo, 10/998, 1 tablet twice a day, Ozempic 1 mg weekly  His A1c has been usually in the upper normal range, now 6.8 compared to 6.4     Current management, blood sugar patterns and problems identified: He is having a higher A1c although his last 2 weeks GMI on the Dexcom is 6.5 He is not always consistent with taking his NovoLog when needed and especially when he is somewhat active at work However such as at lunch yesterday he had a regular soft drink and did not cover it with NovoLog and blood sugar was over 200 postprandially Also may have sporadic high readings after dinner also  Overnight blood sugars are fairly good with the same dose of [place] and he thinks that recent low sugar on the sensor was not  accurate compared with fingerstick which was about 90 Usually no hypoglycemia during the day  His weight at 3 pounds and gradually increasing Takes the [place] consistently as prescribed        Side effects from medications have been: None  Compliance with the medical regimen: Good   Blood Glucose patterns from [facility] download for the last 2 weeks   Overnight blood sugars are generally excellent with blood sugars are well within the target range and averaging as low as 106 during the night; he did have falsely low readings twice during the night compared with fingerstick Some variability with average about 120.  No hypoglycemia and did have a couple of falsely low readings with artifacts Before meal averages are generally fairly good at lunch and dinner with some variability Postprandial readings at breakfast are going up only sporadically above 180 and occasionally upper normal in the target range After lunch blood sugars may be higher at least twice above target range in the last week and not so much in the previous week  Also after dinner he will have some upper normal target range readings and at least 1 reading above 180/week Data shows a low sugar around 3:30 PM and another 1 around 6:30 PM during the day otherwise no hypoglycemia   CGM use %  of time 99  2-week average/GV 133/25  Time in range       90%  % Time Above 180 9  % Time above 250   % Time Below 70 1     Previously:   CGM use % of time   2-week average/GV 136/25  Time in range      89%  % Time Above 180 11  % Time above 250   % Time Below 70 0     PRE-MEAL Fasting Lunch Dinner Bedtime Overall  Glucose range:       Averages: 117       POST-MEAL PC Breakfast PC Lunch PC Dinner  Glucose range:     Averages:  156 156   Previously   CGM use % of time 93  2-week average/GV 134, was 166  Time in range 91 Compared to 76    %  % Time Above 180 8     % Time Below 70 0     Glycemic control:   Lab  Results  Component Value Date   HGBA1C 6.8 (H) 10/20/2022   HGBA1C 6.4 (A) 06/22/2022   HGBA1C 6.8 (A) 03/20/2022   Lab Results  Component Value Date   MICROALBUR <0.7 03/20/2022   LDLCALC 4 03/30/2022   CREATININE 0.99 10/09/2022    Self-care:  Meals: 3 meals per day. Breakfast is eggs or cereal usually,  controlling portions      Dietician visit: Most recent: years ago.                 Weight history:  Wt Readings from Last 3 Encounters:  10/24/22 223 lb (101.2 kg)  10/09/22 223 lb (101.2 kg)  06/22/22 220 lb (99.8 kg)    Lab on 10/20/2022  Component Date Value Ref Range Status   Glucose, Bld 10/20/2022 99  70 - 99 mg/dL Final   Hgb W0J MFr Bld 10/20/2022 6.8 (H)  4.6 - 6.5 % Final   Glycemic Control Guidelines for People with Diabetes:Non Diabetic:  <6%Goal of Therapy: <7%Additional Action Suggested:  >8%     Allergies as of 10/24/2022   No Known Allergies      Medication List        Accurate as of October 24, 2022 11:03 AM. If you have any questions, ask your nurse or doctor.          acetaminophen 650 MG CR tablet Commonly known as: TYLENOL Take 650 mg by mouth every 8 (eight) hours as needed for pain.   azelastine 0.1 % nasal spray Commonly known as: ASTELIN Place 2 sprays into both nostrils 2 (two) times daily. Use in each nostril as directed   Baqsimi One Pack 3 MG/DOSE Powd Generic drug: Glucagon To be sprayed in nostril as needed for severe hypoglycemia   BD Veo Insulin Syringe U/F 31G X 15/64" 0.5 ML Misc Generic drug: Insulin Syringe-Needle U-100 USE 1 SYRINGE AS DIRECTED 3 TO 4 TIMES DAILY   Dexcom G7 Sensor Misc USE AS DIRECTED. CHANGE EVERY 10 DAYS   dicyclomine 10 MG capsule Commonly known as: BENTYL TAKE 1 CAPSULE(10 MG) BY MOUTH FOUR TIMES DAILY BEFORE MEALS AND AT BEDTIME   Eliquis 5 MG Tabs tablet Generic drug: apixaban TAKE 1 TABLET(5 MG) BY MOUTH TWICE DAILY   fish oil-omega-3 fatty acids 1000 MG capsule Take 2 g by mouth  2 (two) times daily.   fluticasone 50 MCG/ACT nasal spray Commonly known as: FLONASE SHAKE LIQUID AND USE 2  SPRAYS IN EACH NOSTRIL DAILY AS NEEDED FOR ALLERGIES OR RHINITIS   irbesartan 150 MG tablet Commonly known as: AVAPRO Take 1 tablet by mouth once daily   loratadine 10 MG tablet Commonly known as: CLARITIN Take 10 mg by mouth daily.   multivitamin with minerals tablet Take 1 tablet by mouth daily.   nitroGLYCERIN 0.4 MG SL tablet Commonly known as: NITROSTAT DISSOLVE ONE TABLET UNDER THE TONGUE EVERY 5 MINUTES AS NEEDED FOR CHEST PAIN.  DO NOT EXCEED A TOTAL OF 3 DOSES IN 15 MINUTES   NovoLOG FlexPen 100 UNIT/ML FlexPen Generic drug: insulin aspart INJECT 10 TO 12 UNITS SUBCUTANEOUSLY THREE TIMES DAILY BEFORE MEAL(S)   OneTouch Delica Plus Lancet33G Misc Use to check blood sugar 4 times per day   OneTouch Verio Reflect w/Device Kit 1   OneTouch Verio test strip Generic drug: glucose blood USE AS DIRECTED TO CHECK BLOOD SUGAR 4 TIMES DAILY   Ozempic (1 MG/DOSE) 4 MG/3ML Sopn Generic drug: Semaglutide (1 MG/DOSE) INJECT 1 MG INTO THE SKIN  ONCE A WEEK   pantoprazole 40 MG tablet Commonly known as: PROTONIX Take 1 tablet (40 mg total) by mouth daily. Take 30 minutes prior to eating.   Polyethyl Glyc-Propyl Glyc PF 0.4-0.3 % Soln Apply 2 drops to eye 3 (three) times daily as needed (for dry eyes).   pregabalin 75 MG capsule Commonly known as: LYRICA Take 1 capsule (75 mg total) by mouth 3 (three) times daily.   PROBIOTIC DAILY PO Take 1 capsule by mouth daily.   rosuvastatin 40 MG tablet Commonly known as: CRESTOR Take 1 tablet by mouth once daily   silodosin 8 MG Caps capsule Commonly known as: RAPAFLO Take 8 mg by mouth daily with breakfast.   Evaristo Bury FlexTouch 100 UNIT/ML FlexTouch Pen Generic drug: insulin degludec Inject 50 units daily   Xigduo XR 10-998 MG Tb24 Generic drug: Dapagliflozin Pro-metFORMIN ER Take 1 tablet by mouth twice daily         Allergies:  No Known Allergies  Past Medical History:  Diagnosis Date   Allergic rhinitis    Benign prostatic hypertrophy    s/p TUNA procedure aprx 2006,   Carpal tunnel syndrome 06/17/2009   Diabetes mellitus    Type II dx 1999   DM type 2 with diabetic peripheral neuropathy (HCC) 10/15/2014   ED (erectile dysfunction)    After TUNA   Elevated LFTs 2010   (-) chronic hep  panel   History of cardiovascular stress test 8/11   Negative   Hyperlipidemia    HYPERSOMNIA UNSPECIFIED 12/11/2007   Hypertension    Obesity    PAF (paroxysmal atrial fibrillation) (HCC) 12/21/2009   a. Failed Multaq. b. Prev on flecainide. c. s/p afib ablation 2015.    Past Surgical History:  Procedure Laterality Date   ABLATION  03/24/2014   PVI by Dr Johney Frame   ATRIAL FIBRILLATION ABLATION N/A 03/24/2014   Procedure: ATRIAL FIBRILLATION ABLATION;  Surgeon: Gardiner Rhyme, MD;  Location: [facility] CATH LAB;  Service: Cardiovascular;  Laterality: N/A;   COLONOSCOPY  11/25/2012   Mild sigmoid diverticulosis, small internal hemorrhoids, otherwise normal colonoscopy   CYSTOSCOPY  04/26/2008   neg  @ urology, was rx Flomax   LEFT HEART CATH AND CORONARY ANGIOGRAPHY N/A 08/30/2021   Procedure: LEFT HEART CATH AND CORONARY ANGIOGRAPHY;  Surgeon: Lennette Bihari, MD;  Location: [facility] INVASIVE CV LAB;  Service: Cardiovascular;  Laterality: N/A;   PROSTATE SURGERY     TUNA  TEE WITHOUT CARDIOVERSION N/A 03/23/2014   Procedure: TRANSESOPHAGEAL ECHOCARDIOGRAM (TEE);  Surgeon: Pricilla Riffle, MD;  Location: [facility] ENDOSCOPY;  Service: Cardiovascular;  Laterality: N/A;   VASECTOMY     several years ago    Family History  Problem Relation Age of Onset   Heart disease Mother    Diabetes Mother    Hypertension Mother    Congestive Heart Failure Mother    Heart attack Mother    Hypertension Father    Colon cancer Neg Hx    Prostate cancer Neg Hx     Social History:  reports that he quit smoking about 37 years  ago. His smoking use included cigarettes. He has never used smokeless tobacco. He reports that he does not drink alcohol and does not use drugs.    Review of Systems       Lipids: He has been on Crestor 40 mg and also followed by cardiology  On bid fish oil for high triglycerides  Labs as below:      Lab Results  Component Value Date   CHOL 63 03/30/2022   HDL 33.90 (L) 03/30/2022   LDLCALC 4 03/30/2022   LDLDIRECT 62.0 03/29/2021   TRIG 125.0 03/30/2022   CHOLHDL 2 03/30/2022                Hypertension, mild and consistently controlled  Treated with Avapro 150 mg from his cardiologist  BP Readings from Last 3 Encounters:  10/24/22 (!) 120/58  10/09/22 120/68  06/22/22 124/60     Neuropathy: Has longstanding history of Numbness, pins-and-needle sensation and stinging in feet and lower legs He has been followed by neurologist, taking Lyrica 75 mg, 3 times daily Generally has good control of abnormal sensations  No sensory loss on exam     LABS:  Lab on 10/20/2022  Component Date Value Ref Range Status   Glucose, Bld 10/20/2022 99  70 - 99 mg/dL Final   Hgb W1X MFr Bld 10/20/2022 6.8 (H)  4.6 - 6.5 % Final   Glycemic Control Guidelines for People with Diabetes:Non Diabetic:  <6%Goal of Therapy: <7%Additional Action Suggested:  >8%     Physical Examination:  BP (!) 120/58   Pulse 80   Ht 5\' 11"  (1.803 m)   Wt 223 lb (101.2 kg)   SpO2 96%   BMI 31.10 kg/m    ASSESSMENT/PLAN:    Diabetes type 2 on insulin  See history of present illness for detailed discussion of current diabetes management, blood sugar patterns and problems identified  His A1c is 6.8  He is on basal bolus insulin with Ozempic 1 mg and Xigduo  He is having a slightly higher A1c but recent blood sugars on his Dexcom sensor indicate a GMI of 6.5 and 90% blood sugars within target range As above he has some postprandial hyperglycemia inconsistently depending on his diet Does not  always take NovoLog but only when he feels like he is not active and eating larger portions or more carbohydrate Recently has tendency to some weight gain  Mild hypertension: Well-controlled Renal function normal  Plan:  He needs to try and work on his diet a little bit better with reducing carbohydrates and soft drinks He should take lunchtime coverage with NovoLog more consistently especially if he is having regular soft drink and either high fat or high carbohydrate meal May take only 10 units of NovoLog at dinnertime if he does not feel like he needs to fill 15 units No  change in Tresiba May consider increasing Ozempic on the next visit if A1c of weight not better Also discussed need for aerobic and resistance exercises to help improve insulin sensitivity, increased muscle mass and prevent sarcopenia from Ozempic  He will discuss follow-up levels of lipids to be checked with his cardiologist  Patient Instructions  Take 10 Novolog if drinking Coke      Reather Littler 10/24/2022, 11:03 AM   Note: This office note was prepared with Dragon voice recognition system technology. Any transcriptional errors that result from this process are unintentional. .

## 2022-10-24 NOTE — Telephone Encounter (Signed)
  Patient Consent for Virtual Visit         Danny [name] has provided verbal consent on 10/24/2022 for a virtual visit (video or telephone).   CONSENT FOR VIRTUAL VISIT FOR:  Danny [name]  By participating in this virtual visit I agree to the following:  I hereby voluntarily request, consent and authorize [facility] and its employed or contracted physicians, physician assistants, nurse practitioners or other licensed health care professionals (the Practitioner), to provide me with telemedicine health care services (the "Services") as deemed necessary by the treating Practitioner. I acknowledge and consent to receive the Services by the Practitioner via telemedicine. I understand that the telemedicine visit will involve communicating with the Practitioner through live audiovisual communication technology and the disclosure of certain medical information by electronic transmission. I acknowledge that I have been given the opportunity to request an in-person assessment or other available alternative prior to the telemedicine visit and am voluntarily participating in the telemedicine visit.  I understand that I have the right to withhold or withdraw my consent to the use of telemedicine in the course of my care at any time, without affecting my right to future care or treatment, and that the Practitioner or I may terminate the telemedicine visit at any time. I understand that I have the right to inspect all information obtained and/or recorded in the course of the telemedicine visit and may receive copies of available information for a reasonable fee.  I understand that some of the potential risks of receiving the Services via telemedicine include:  Delay or interruption in medical evaluation due to technological equipment failure or disruption; Information transmitted may not be sufficient (e.g. poor resolution of images) to allow for appropriate medical decision making by the  Practitioner; and/or  In rare instances, security protocols could fail, causing a breach of personal health information.  Furthermore, I acknowledge that it is my responsibility to provide information about my medical history, conditions and care that is complete and accurate to the best of my ability. I acknowledge that Practitioner's advice, recommendations, and/or decision may be based on factors not within their control, such as incomplete or inaccurate data provided by me or distortions of diagnostic images or specimens that may result from electronic transmissions. I understand that the practice of medicine is not an exact science and that Practitioner makes no warranties or guarantees regarding treatment outcomes. I acknowledge that a copy of this consent can be made available to me via my patient portal [facility] MyChart), or I can request a printed copy by calling the office of  [facility].    I understand that my insurance will be billed for this visit.   I have read or had this consent read to me. I understand the contents of this consent, which adequately explains the benefits and risks of the Services being provided via telemedicine.  I have been provided ample opportunity to ask questions regarding this consent and the Services and have had my questions answered to my satisfaction. I give my informed consent for the services to be provided through the use of telemedicine in my medical care

## 2022-10-26 ENCOUNTER — Other Ambulatory Visit: Payer: Self-pay | Admitting: Endocrinology

## 2022-10-27 ENCOUNTER — Encounter: Payer: Self-pay | Admitting: Endocrinology

## 2022-10-30 ENCOUNTER — Ambulatory Visit: Payer: BC Managed Care – PPO | Admitting: Podiatry

## 2022-11-14 ENCOUNTER — Other Ambulatory Visit: Payer: Self-pay | Admitting: Endocrinology

## 2022-11-15 ENCOUNTER — Other Ambulatory Visit: Payer: Self-pay | Admitting: Endocrinology

## 2022-11-21 LAB — HM DIABETES EYE EXAM

## 2022-11-27 ENCOUNTER — Ambulatory Visit: Payer: BC Managed Care – PPO | Attending: Cardiology | Admitting: Student

## 2022-11-27 DIAGNOSIS — Z01810 Encounter for preprocedural cardiovascular examination: Secondary | ICD-10-CM

## 2022-11-27 NOTE — Progress Notes (Signed)
Virtual Visit via Telephone Note   Because of Danny [name]'s co-morbid illnesses, he is at least at moderate risk for complications without adequate follow up.  This format is felt to be most appropriate for this patient at this time.  The patient did not have access to video technology/had technical difficulties with video requiring transitioning to audio format only (telephone).  All issues noted in this document were discussed and addressed.  No physical exam could be performed with this format.  Please refer to the patient's chart for his consent to telehealth for [facility].  Evaluation Performed:  Preoperative cardiovascular risk assessment _____________   Date:  11/27/2022   Patient ID:  Danny [name], DOB [birth year], MRN [id number] Patient Location:  Home Provider location:   Office  Primary Care Provider:  Wanda Plump, MD Primary Cardiologist:  Olga Millers, MD  Chief Complaint / Patient Profile   62 y.o. y/o male with a h/o PAF s/p A-fib ablation September 2015 on anticoagulation, nonobstructive CAD, hypertension, hyperlipidemia, T2DM who is pending colonoscopy by Dr. Chales Abrahams and presents today for telephonic preoperative cardiovascular risk assessment.  History of Present Illness    Danny [name] is a 62 y.o. male who presents via audio/video conferencing for a telehealth visit today.  Pt was last seen in cardiology clinic on 05/17/2022 by Edd Fabian, NP.  At that time Danny [name] was doing well from a cardiac standpoint.  The patient is now pending procedure as outlined above. Since his last visit, he is doing well. Patient denies shortness of breath or dyspnea on exertion. No chest pain, pressure, or tightness. Denies  orthopnea or PND. No palpitations.  He occasional gets lower extremity edema by the end of the day that resolves quickly with elevations.   Past Medical History    Past Medical History:  Diagnosis Date   Allergic rhinitis     Benign prostatic hypertrophy    s/p TUNA procedure aprx 2006,   Carpal tunnel syndrome 06/17/2009   Diabetes mellitus    Type II dx 1999   DM type 2 with diabetic peripheral neuropathy (HCC) 10/15/2014   ED (erectile dysfunction)    After TUNA   Elevated LFTs 2010   (-) chronic hep  panel   History of cardiovascular stress test 8/11   Negative   Hyperlipidemia    HYPERSOMNIA UNSPECIFIED 12/11/2007   Hypertension    Obesity    PAF (paroxysmal atrial fibrillation) (HCC) 12/21/2009   a. Failed Multaq. b. Prev on flecainide. c. s/p afib ablation 2015.   Past Surgical History:  Procedure Laterality Date   ABLATION  03/24/2014   PVI by Dr Johney Frame   ATRIAL FIBRILLATION ABLATION N/A 03/24/2014   Procedure: ATRIAL FIBRILLATION ABLATION;  Surgeon: Gardiner Rhyme, MD;  Location: [facility] CATH LAB;  Service: Cardiovascular;  Laterality: N/A;   COLONOSCOPY  11/25/2012   Mild sigmoid diverticulosis, small internal hemorrhoids, otherwise normal colonoscopy   CYSTOSCOPY  04/26/2008   neg  @ urology, was rx Flomax   LEFT HEART CATH AND CORONARY ANGIOGRAPHY N/A 08/30/2021   Procedure: LEFT HEART CATH AND CORONARY ANGIOGRAPHY;  Surgeon: Lennette Bihari, MD;  Location: [facility] INVASIVE CV LAB;  Service: Cardiovascular;  Laterality: N/A;   PROSTATE SURGERY     TUNA   TEE WITHOUT CARDIOVERSION N/A 03/23/2014   Procedure: TRANSESOPHAGEAL ECHOCARDIOGRAM (TEE);  Surgeon: Pricilla Riffle, MD;  Location: [facility] ENDOSCOPY;  Service: Cardiovascular;  Laterality: N/A;   VASECTOMY  several years ago    Allergies  No Known Allergies  Home Medications    Prior to Admission medications   Medication Sig Start Date End Date Taking? Authorizing Provider  acetaminophen (TYLENOL) 650 MG CR tablet Take 650 mg by mouth every 8 (eight) hours as needed for pain.    [provider]  azelastine (ASTELIN) 0.1 % nasal spray Place 2 sprays into both nostrils 2 (two) times daily. Use in each nostril as directed 12/14/20   Wanda Plump, MD  BD VEO INSULIN SYRINGE U/F 31G X 15/64" 0.5 ML MISC USE 1 SYRINGE AS DIRECTED 3 TO 4 TIMES DAILY 07/10/19   Reather Littler, MD  Blood Glucose Monitoring Suppl (ONETOUCH VERIO REFLECT) w/Device KIT 1 10/04/21   Reather Littler, MD  Continuous Glucose Sensor (DEXCOM G7 SENSOR) MISC USE AS DIRECTED. CHANGE EVERY 10 DAYS 10/26/22   Reather Littler, MD  Dapagliflozin Pro-metFORMIN ER 10-998 MG TB24 Take 1 tablet by mouth twice daily 11/14/22   Reather Littler, MD  dicyclomine (BENTYL) 10 MG capsule TAKE 1 CAPSULE(10 MG) BY MOUTH FOUR TIMES DAILY BEFORE MEALS AND AT BEDTIME 07/03/22   Lynann Bologna, MD  ELIQUIS 5 MG TABS tablet TAKE 1 TABLET(5 MG) BY MOUTH TWICE DAILY 09/15/22   Lewayne Bunting, MD  fish oil-omega-3 fatty acids 1000 MG capsule Take 2 g by mouth 2 (two) times daily.    [provider]  fluticasone (FLONASE) 50 MCG/ACT nasal spray SHAKE LIQUID AND USE 2 SPRAYS IN EACH NOSTRIL DAILY AS NEEDED FOR ALLERGIES OR RHINITIS 05/20/22   Wanda Plump, MD  Glucagon (BAQSIMI ONE PACK) 3 MG/DOSE POWD To be sprayed in nostril as needed for severe hypoglycemia 10/10/21   Reather Littler, MD  glucose blood (ONETOUCH VERIO) test strip USE AS DIRECTED TO CHECK BLOOD SUGAR 4 TIMES DAILY 05/15/22   Reather Littler, MD  irbesartan (AVAPRO) 150 MG tablet Take 1 tablet by mouth once daily 09/14/22   Lewayne Bunting, MD  Lancets [facility] DELICA PLUS [place]) MISC Use to check blood sugar 4 times per day 10/04/21   Reather Littler, MD  loratadine (CLARITIN) 10 MG tablet Take 10 mg by mouth daily.    [provider]  Multiple Vitamins-Minerals (MULTIVITAMIN WITH MINERALS) tablet Take 1 tablet by mouth daily.    [provider]  nitroGLYCERIN (NITROSTAT) 0.4 MG SL tablet DISSOLVE ONE TABLET UNDER THE TONGUE EVERY 5 MINUTES AS NEEDED FOR CHEST PAIN.  DO NOT EXCEED A TOTAL OF 3 DOSES IN 15 MINUTES 09/11/22   Lewayne Bunting, MD  NOVOLOG FLEXPEN 100 UNIT/ML FlexPen Inject 15 units before meals as needed 11/16/22    Reather Littler, MD  pantoprazole (PROTONIX) 40 MG tablet Take 1 tablet (40 mg total) by mouth daily. Take 30 minutes prior to eating. 06/21/22   Lynann Bologna, MD  Polyethyl Glyc-Propyl Glyc PF 0.4-0.3 % SOLN Apply 2 drops to eye 3 (three) times daily as needed (for dry eyes).    [provider]  pregabalin (LYRICA) 75 MG capsule Take 1 capsule (75 mg total) by mouth 3 (three) times daily. 07/06/22   Wanda Plump, MD  Probiotic Product (PROBIOTIC DAILY PO) Take 1 capsule by mouth daily.    [provider]  rosuvastatin (CRESTOR) 40 MG tablet Take 1 tablet by mouth once daily 06/27/22   Lewayne Bunting, MD  Semaglutide, 1 MG/DOSE, (OZEMPIC, 1 MG/DOSE,) 4 MG/3ML SOPN INJECT 1 MG INTO THE SKIN  ONCE A WEEK 09/23/22  Reather Littler, MD  silodosin (RAPAFLO) 8 MG CAPS capsule Take 8 mg by mouth daily with breakfast.    [provider]  TRESIBA FLEXTOUCH 100 UNIT/ML FlexTouch Pen INJECT 50 UNITS SUBCUTANEOUSLY ONCE DAILY 11/16/22   Reather Littler, MD    Physical Exam    Vital Signs:  Amalia Greenhouse does not have vital signs available for review today.  Given telephonic nature of communication, physical exam is limited. AAOx3. NAD. Normal affect.  Speech and respirations are unlabored.  Accessory Clinical Findings    None  Assessment & Plan    Primary Cardiologist: Olga Millers, MD  Preoperative cardiovascular risk assessment.  Colonoscopy with Dr. Chales Abrahams  Chart reviewed as part of pre-operative protocol coverage. According to the RCRI, patient has a 0.9% risk of MACE. Patient reports activity equivalent to >4.0 METS (works full time for [employer] walking and doing a lot of lifting).   Given past medical history and time since last visit, based on ACC/AHA guidelines, Danny [name] would be at acceptable risk for the planned procedure without further cardiovascular testing.   Patient was advised that if he develops new symptoms prior to surgery to contact our office to arrange a  follow-up appointment.  he verbalized understanding.  2. SBE/Anti-coag.  Per Pharm.D.: Patient with diagnosis of afib on Eliquis for anticoagulation.     Procedure: colonoscopy Date of procedure: 12/25/22   CHA2DS2-VASc Score = 3  This indicates a 3.2% annual risk of stroke. The patient's score is based upon: CHF History: 0 HTN History: 1 Diabetes History: 1 Stroke History: 0 Vascular Disease History: 1 Age Score: 0 Gender Score: 0   CrCl 49mL/min using adj body weight Platelet count 243K   Per office protocol, patient can hold Eliquis for 2 days prior to procedure as requested.    I will route this recommendation to the requesting party via Epic fax function.  Please call with questions.  Time:   Today, I have spent 5 minutes with the patient with telehealth technology discussing medical history, symptoms, and management plan.     Danny Levering, NP  11/27/2022, 8:52 AM

## 2022-11-28 ENCOUNTER — Ambulatory Visit: Payer: BC Managed Care – PPO | Admitting: Podiatry

## 2022-11-28 DIAGNOSIS — M79675 Pain in left toe(s): Secondary | ICD-10-CM | POA: Diagnosis not present

## 2022-11-28 DIAGNOSIS — L600 Ingrowing nail: Secondary | ICD-10-CM

## 2022-11-28 DIAGNOSIS — Z794 Long term (current) use of insulin: Secondary | ICD-10-CM

## 2022-11-28 DIAGNOSIS — E0840 Diabetes mellitus due to underlying condition with diabetic neuropathy, unspecified: Secondary | ICD-10-CM

## 2022-11-28 DIAGNOSIS — M792 Neuralgia and neuritis, unspecified: Secondary | ICD-10-CM | POA: Diagnosis not present

## 2022-11-28 DIAGNOSIS — M79674 Pain in right toe(s): Secondary | ICD-10-CM

## 2022-11-28 DIAGNOSIS — L84 Corns and callosities: Secondary | ICD-10-CM

## 2022-11-28 DIAGNOSIS — B351 Tinea unguium: Secondary | ICD-10-CM | POA: Diagnosis not present

## 2022-11-28 DIAGNOSIS — M7741 Metatarsalgia, right foot: Secondary | ICD-10-CM

## 2022-11-28 DIAGNOSIS — M7742 Metatarsalgia, left foot: Secondary | ICD-10-CM

## 2022-11-28 MED ORDER — PREGABALIN 100 MG PO CAPS: 100.0000 mg | ORAL_CAPSULE | Freq: Three times a day (TID) | ORAL | 2 refills | Status: DC

## 2022-11-28 NOTE — Progress Notes (Signed)
  Subjective:  Patient ID: Danny [name], male    DOB: [birth year],  MRN: [id number]  Chief Complaint  Patient presents with   Nail Problem    Diabetic Foot Care. Patient administers insulin for diabetes control.    Peripheral Neuropathy    Patient complaining of neuropathy and is currently taking Lyrica 75mg . Patient has been on the same dose for a while.    Foot Orthotics    Patient is interested in talking about inserts.     62 y.o. male presents with the above complaint. History confirmed with patient. Patient presenting with pain related to dystrophic thickened elongated nails. Patient is unable to trim own nails related to nail dystrophy and/or mobility issues. Patient does  have a history of T2DM. Patient does have callus present located at the plantar 1st MPJ left foot as well as the fifth metatarsal head bilaterally that have previously been debrided however they are not prominent today and no causing pain he uses a pumice stone to grind them down.  Patient is interested in getting diabetic liners and shoes with  Objective:  Physical Exam: warm, good capillary refill nail exam onychomycosis of the toenails, onycholysis, and dystrophic nails DP pulses palpable, PT pulses palpable, and protective sensation intact Left Foot:  Pain with palpation of nails due to elongation and dystrophic growth.  Hyperkeratotic lesion sub one and fifth metatarsal heads very mild Right Foot: Pain with palpation of nails due to elongation and dystrophic growth.  Right foot with hyperkeratotic lesion subfifth metatarsal head very mild  Assessment:   1. Neuropathic pain   2. Pain due to onychomycosis of toenails of both feet   3. Pre-ulcerative calluses   4. Ingrown nail   5. Diabetes mellitus due to underlying condition, controlled, with diabetic neuropathy, with long-term current use of insulin (HCC)   6. Metatarsalgia of both feet        Plan:  Patient was evaluated and treated and all  questions answered.  # Peripheral neuropathy and neuropathic pain -Recommended increasing Lyrica dose from 75 mg to 100 mg 3 times daily -Patient states he has been on 100 mg dosage before and it was helpful. -Discussed risks and side effects associate with Lyrica therapy  # Metatarsalgia and preulcerative calluses with DM2 with mild neuropathy # Planter fasciitis -Recommend diabetic shoes and liners to be fabricated for prevention of pressure ulcerations for the bilateral foot -Patient also has plantar fasciitis and says that the liners help prevent irritation in the bottom of the heel  #Onychomycosis with pain  -Nails palliatively debrided as below. -Educated on self-care  Procedure: Nail Debridement Rationale: Pain Type of Debridement: manual, sharp debridement. Instrumentation: Nail nipper, rotary burr. Number of Nails: 10  Return in about 3 months (around 02/28/2023) for [facility]         Corinna Gab, DPM [facility]

## 2022-11-29 NOTE — Telephone Encounter (Signed)
Pt contacted to ensure that he was aware of Cardiologist recommendations to hold Eliquis for 2 days prior to procedure as requested.    Pt stated that he was aware.   Pt verbalized understanding with all questions answered.

## 2022-12-08 ENCOUNTER — Encounter: Payer: Self-pay | Admitting: Gastroenterology

## 2022-12-11 ENCOUNTER — Other Ambulatory Visit: Payer: Self-pay | Admitting: Endocrinology

## 2022-12-13 ENCOUNTER — Other Ambulatory Visit: Payer: Self-pay | Admitting: Endocrinology

## 2022-12-13 ENCOUNTER — Telehealth: Payer: Self-pay | Admitting: Internal Medicine

## 2022-12-13 MED ORDER — HYDROCODONE BIT-HOMATROP MBR 5-1.5 MG/5ML PO SOLN: 5.0000 mL | Freq: Every evening | ORAL | 0 refills | Status: DC | PRN

## 2022-12-13 NOTE — Telephone Encounter (Signed)
Patient spouse called and states [facility] was seen [facility] [place]. He was prescribed cough medicine for Night but spouse says it is not helping. She wanted to know was there anything that can be prescribed for the night time cough. Please advise

## 2022-12-13 NOTE — Telephone Encounter (Signed)
Please advise- notes in care everywhere.

## 2022-12-13 NOTE — Telephone Encounter (Signed)
Spoke w/ Fannie Knee- informed of recommendations. Fannie Knee verbalized understanding.

## 2022-12-13 NOTE — Telephone Encounter (Signed)
Dx with acute bronchitis, prescribed prednisone, Tessalon, Promethazine DM. PDMP okay.  Sent a prescription for hydrocodone liquid to be taken at night for cough suppression. Make them aware that there is a risk for abuse, drowsiness with hydrocodone.  If he accepts the risk, okay to pick up the prescription for the pharmacy. If not better in few days needs office visit here

## 2022-12-13 NOTE — Addendum Note (Signed)
Addended by: [name] E on: 12/13/2022 09:27 AM   Modules accepted: Orders

## 2022-12-19 ENCOUNTER — Telehealth: Payer: Self-pay | Admitting: Nurse Practitioner

## 2022-12-19 NOTE — Telephone Encounter (Signed)
Patients wife called regarding the patients prep medication that has not be sent yet. Please send to the [facility] on file and call the patient so he knows its been sent.

## 2022-12-20 NOTE — Telephone Encounter (Signed)
Contacted pt & pt verbalized understanding that his prep is a miralax prep and that all of the items he will need will have to be purchased over the counter.

## 2022-12-25 ENCOUNTER — Encounter: Payer: Self-pay | Admitting: Gastroenterology

## 2022-12-25 ENCOUNTER — Ambulatory Visit: Payer: BC Managed Care – PPO | Admitting: Gastroenterology

## 2022-12-25 VITALS — BP 127/73 | HR 56 | Temp 98.6°F | Resp 11 | Ht 71.0 in | Wt 223.0 lb

## 2022-12-25 DIAGNOSIS — K5900 Constipation, unspecified: Secondary | ICD-10-CM

## 2022-12-25 DIAGNOSIS — Z1211 Encounter for screening for malignant neoplasm of colon: Secondary | ICD-10-CM | POA: Diagnosis not present

## 2022-12-25 MED ORDER — SODIUM CHLORIDE 0.9 % IV SOLN
500.0000 mL | INTRAVENOUS | Status: DC
Start: 2022-12-25 — End: 2022-12-25

## 2022-12-25 NOTE — Progress Notes (Signed)
[facility] History and Physical   Primary Care Physician:  Wanda Plump, MD   Reason for Procedure:   H/O  polyps.  Plan:    colon     HPI: Danny [name] is a 62 y.o. male    With history of constipation  Past Medical History:  Diagnosis Date   Allergic rhinitis    Benign prostatic hypertrophy    s/p TUNA procedure aprx 2006,   Carpal tunnel syndrome 06/17/2009   Diabetes mellitus    Type II dx 1999   DM type 2 with diabetic peripheral neuropathy (HCC) 10/15/2014   ED (erectile dysfunction)    After TUNA   Elevated LFTs 2010   (-) chronic hep  panel   History of cardiovascular stress test 8/11   Negative   Hyperlipidemia    HYPERSOMNIA UNSPECIFIED 12/11/2007   Hypertension    Obesity    PAF (paroxysmal atrial fibrillation) (HCC) 12/21/2009   a. Failed Multaq. b. Prev on flecainide. c. s/p afib ablation 2015.    Past Surgical History:  Procedure Laterality Date   ABLATION  03/24/2014   PVI by Dr Johney Frame   ATRIAL FIBRILLATION ABLATION N/A 03/24/2014   Procedure: ATRIAL FIBRILLATION ABLATION;  Surgeon: Gardiner Rhyme, MD;  Location: [facility] CATH LAB;  Service: Cardiovascular;  Laterality: N/A;   COLONOSCOPY  11/25/2012   Mild sigmoid diverticulosis, small internal hemorrhoids, otherwise normal colonoscopy   CYSTOSCOPY  04/26/2008   neg  @ urology, was rx Flomax   LEFT HEART CATH AND CORONARY ANGIOGRAPHY N/A 08/30/2021   Procedure: LEFT HEART CATH AND CORONARY ANGIOGRAPHY;  Surgeon: Lennette Bihari, MD;  Location: [facility] INVASIVE CV LAB;  Service: Cardiovascular;  Laterality: N/A;   PROSTATE SURGERY     TUNA   TEE WITHOUT CARDIOVERSION N/A 03/23/2014   Procedure: TRANSESOPHAGEAL ECHOCARDIOGRAM (TEE);  Surgeon: Pricilla Riffle, MD;  Location: [facility] ENDOSCOPY;  Service: Cardiovascular;  Laterality: N/A;   VASECTOMY     several years ago    Prior to Admission medications   Medication Sig Start Date End Date Taking? Authorizing Provider  azelastine (ASTELIN) 0.1 %  nasal spray Place 2 sprays into both nostrils 2 (two) times daily. Use in each nostril as directed 12/14/20  Yes Wanda Plump, MD  Dapagliflozin Pro-metFORMIN ER 10-998 MG TB24 Take 1 tablet by mouth twice daily 11/14/22  Yes Reather Littler, MD  dicyclomine (BENTYL) 10 MG capsule TAKE 1 CAPSULE(10 MG) BY MOUTH FOUR TIMES DAILY BEFORE MEALS AND AT BEDTIME 07/03/22  Yes Lynann Bologna, MD  fish oil-omega-3 fatty acids 1000 MG capsule Take 2 g by mouth 2 (two) times daily.   Yes [provider]  fluticasone (FLONASE) 50 MCG/ACT nasal spray SHAKE LIQUID AND USE 2 SPRAYS IN EACH NOSTRIL DAILY AS NEEDED FOR ALLERGIES OR RHINITIS 05/20/22  Yes Paz, Nolon Rod, MD  HYDROcodone bit-homatropine (HYCODAN) 5-1.5 MG/5ML syrup Take 5 mLs by mouth at bedtime as needed for cough. 12/13/22  Yes Paz, Nolon Rod, MD  irbesartan (AVAPRO) 150 MG tablet Take 1 tablet by mouth once daily 09/14/22  Yes Crenshaw, Madolyn Frieze, MD  loratadine (CLARITIN) 10 MG tablet Take 10 mg by mouth daily.   Yes [provider]  Multiple Vitamins-Minerals (MULTIVITAMIN WITH MINERALS) tablet Take 1 tablet by mouth daily.   Yes [provider]  NOVOLOG FLEXPEN 100 UNIT/ML FlexPen INJECT 10 TO 12 UNITS SUBCUTANEOUSLY THREE TIMES DAILY BEFORE MEAL(S) 12/14/22  Yes Reather Littler, MD  pantoprazole (PROTONIX) 40 MG  tablet Take 1 tablet (40 mg total) by mouth daily. Take 30 minutes prior to eating. 06/21/22  Yes Lynann Bologna, MD  pregabalin (LYRICA) 100 MG capsule Take 1 capsule (100 mg total) by mouth 3 (three) times daily. 11/28/22 02/26/23 Yes Standiford, Jenelle Mages, DPM  Probiotic Product (PROBIOTIC DAILY PO) Take 1 capsule by mouth daily.   Yes [provider]  rosuvastatin (CRESTOR) 40 MG tablet Take 1 tablet by mouth once daily 06/27/22  Yes Crenshaw, Madolyn Frieze, MD  silodosin (RAPAFLO) 8 MG CAPS capsule Take 8 mg by mouth daily with breakfast.   Yes [provider]  TRESIBA FLEXTOUCH 100 UNIT/ML FlexTouch Pen INJECT 42 UNITS  SUBCUTANEOUSLY ONCE DAILY 12/14/22  Yes Reather Littler, MD  acetaminophen (TYLENOL) 650 MG CR tablet Take 650 mg by mouth every 8 (eight) hours as needed for pain.    [provider]  BD VEO INSULIN SYRINGE U/F 31G X 15/64" 0.5 ML MISC USE 1 SYRINGE AS DIRECTED 3 TO 4 TIMES DAILY 07/10/19   Reather Littler, MD  Blood Glucose Monitoring Suppl (ONETOUCH VERIO REFLECT) w/Device KIT 1 10/04/21   Reather Littler, MD  Continuous Glucose Sensor (DEXCOM G7 SENSOR) MISC USE AS DIRECTED. CHANGE EVERY 10 DAYS 10/26/22   Reather Littler, MD  ELIQUIS 5 MG TABS tablet TAKE 1 TABLET(5 MG) BY MOUTH TWICE DAILY 09/15/22   Lewayne Bunting, MD  Glucagon (BAQSIMI ONE PACK) 3 MG/DOSE POWD To be sprayed in nostril as needed for severe hypoglycemia 10/10/21   Reather Littler, MD  glucose blood (ONETOUCH VERIO) test strip USE AS DIRECTED TO CHECK BLOOD SUGAR 4 TIMES DAILY 05/15/22   Reather Littler, MD  Lancets [facility] DELICA PLUS [place]) MISC Use to check blood sugar 4 times per day 10/04/21   Reather Littler, MD  nitroGLYCERIN (NITROSTAT) 0.4 MG SL tablet DISSOLVE ONE TABLET UNDER THE TONGUE EVERY 5 MINUTES AS NEEDED FOR CHEST PAIN.  DO NOT EXCEED A TOTAL OF 3 DOSES IN 15 MINUTES 09/11/22   Lewayne Bunting, MD  Polyethyl Glyc-Propyl Glyc PF 0.4-0.3 % SOLN Apply 2 drops to eye 3 (three) times daily as needed (for dry eyes).    [provider]  pregabalin (LYRICA) 75 MG capsule Take 1 capsule (75 mg total) by mouth 3 (three) times daily. Patient not taking: Reported on 12/25/2022 07/06/22   Wanda Plump, MD  Semaglutide, 1 MG/DOSE, (OZEMPIC, 1 MG/DOSE,) 4 MG/3ML SOPN INJECT 1 DOSE SUBCUTANEOUSLY ONCE A WEEK 12/11/22   Reather Littler, MD    Current Outpatient Medications  Medication Sig Dispense Refill   azelastine (ASTELIN) 0.1 % nasal spray Place 2 sprays into both nostrils 2 (two) times daily. Use in each nostril as directed 30 mL 5   Dapagliflozin Pro-metFORMIN ER 10-998 MG TB24 Take 1 tablet by mouth twice daily 180 tablet 0    dicyclomine (BENTYL) 10 MG capsule TAKE 1 CAPSULE(10 MG) BY MOUTH FOUR TIMES DAILY BEFORE MEALS AND AT BEDTIME 120 capsule 11   fish oil-omega-3 fatty acids 1000 MG capsule Take 2 g by mouth 2 (two) times daily.     fluticasone (FLONASE) 50 MCG/ACT nasal spray SHAKE LIQUID AND USE 2 SPRAYS IN EACH NOSTRIL DAILY AS NEEDED FOR ALLERGIES OR RHINITIS 16 g 5   HYDROcodone bit-homatropine (HYCODAN) 5-1.5 MG/5ML syrup Take 5 mLs by mouth at bedtime as needed for cough. 90 mL 0   irbesartan (AVAPRO) 150 MG tablet Take 1 tablet by mouth once daily 90 tablet 2   loratadine (  CLARITIN) 10 MG tablet Take 10 mg by mouth daily.     Multiple Vitamins-Minerals (MULTIVITAMIN WITH MINERALS) tablet Take 1 tablet by mouth daily.     NOVOLOG FLEXPEN 100 UNIT/ML FlexPen INJECT 10 TO 12 UNITS SUBCUTANEOUSLY THREE TIMES DAILY BEFORE MEAL(S) 15 mL 1   pantoprazole (PROTONIX) 40 MG tablet Take 1 tablet (40 mg total) by mouth daily. Take 30 minutes prior to eating. 90 tablet 3   pregabalin (LYRICA) 100 MG capsule Take 1 capsule (100 mg total) by mouth 3 (three) times daily. 90 capsule 2   Probiotic Product (PROBIOTIC DAILY PO) Take 1 capsule by mouth daily.     rosuvastatin (CRESTOR) 40 MG tablet Take 1 tablet by mouth once daily 90 tablet 3   silodosin (RAPAFLO) 8 MG CAPS capsule Take 8 mg by mouth daily with breakfast.     TRESIBA FLEXTOUCH 100 UNIT/ML FlexTouch Pen INJECT 42 UNITS SUBCUTANEOUSLY ONCE DAILY 15 mL 1   acetaminophen (TYLENOL) 650 MG CR tablet Take 650 mg by mouth every 8 (eight) hours as needed for pain.     BD VEO INSULIN SYRINGE U/F 31G X 15/64" 0.5 ML MISC USE 1 SYRINGE AS DIRECTED 3 TO 4 TIMES DAILY 100 each 0   Blood Glucose Monitoring Suppl (ONETOUCH VERIO REFLECT) w/Device KIT 1 1 kit 0   Continuous Glucose Sensor (DEXCOM G7 SENSOR) MISC USE AS DIRECTED. CHANGE EVERY 10 DAYS 3 each 3   ELIQUIS 5 MG TABS tablet TAKE 1 TABLET(5 MG) BY MOUTH TWICE DAILY 60 tablet 5   Glucagon (BAQSIMI ONE PACK) 3 MG/DOSE  POWD To be sprayed in nostril as needed for severe hypoglycemia 1 each 1   glucose blood (ONETOUCH VERIO) test strip USE AS DIRECTED TO CHECK BLOOD SUGAR 4 TIMES DAILY 125 each 2   Lancets (ONETOUCH DELICA PLUS LANCET33G) MISC Use to check blood sugar 4 times per day 200 each 2   nitroGLYCERIN (NITROSTAT) 0.4 MG SL tablet DISSOLVE ONE TABLET UNDER THE TONGUE EVERY 5 MINUTES AS NEEDED FOR CHEST PAIN.  DO NOT EXCEED A TOTAL OF 3 DOSES IN 15 MINUTES 25 tablet 0   Polyethyl Glyc-Propyl Glyc PF 0.4-0.3 % SOLN Apply 2 drops to eye 3 (three) times daily as needed (for dry eyes).     pregabalin (LYRICA) 75 MG capsule Take 1 capsule (75 mg total) by mouth 3 (three) times daily. (Patient not taking: Reported on 12/25/2022) 90 capsule 10   Semaglutide, 1 MG/DOSE, (OZEMPIC, 1 MG/DOSE,) 4 MG/3ML SOPN INJECT 1 DOSE SUBCUTANEOUSLY ONCE A WEEK 3 mL 0   No current facility-administered medications for this visit.    Allergies as of 12/25/2022   (No Known Allergies)    Family History  Problem Relation Age of Onset   Heart disease Mother    Diabetes Mother    Hypertension Mother    Congestive Heart Failure Mother    Heart attack Mother    Hypertension Father    Colon cancer Neg Hx    Prostate cancer Neg Hx    Stomach cancer Neg Hx    Rectal cancer Neg Hx     Social History   Socioeconomic History   Marital status: Married    Spouse name: Not on file   Number of children: 3   Years of education: GED   Highest education level: Not on file  Occupational History   Occupation: Works for [employer] (state).  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[employer] [employer]    Comment:  works for [employer] (state)  Tobacco Use   Smoking status: Former    Types: Cigarettes    Quit date: 06/26/1985    Years since quitting: 37.5   Smokeless tobacco: Never   Tobacco comments:    Former smoker  Building services engineer Use: Never used  Substance and Sexual Activity   Alcohol use: No    Comment:     Drug use: No   Sexual activity: Not  on file  Other Topics Concern   Not on file  Social History Narrative   Lives with wife in a one story   Right handed    One story home   3 children         Social Determinants of Health   Financial Resource Strain: Not on file  Food Insecurity: Not on file  Transportation Needs: Not on file  Physical Activity: Not on file  Stress: Not on file  Social Connections: Not on file  Intimate Partner Violence: Not on file    Review of Systems: Positive for none All other review of systems negative except as mentioned in the HPI.  Physical Exam: Vital signs in last 24 hours: @VSRANGES @   General:   Alert,  Well-developed, well-nourished, pleasant and cooperative in NAD Lungs:  Clear throughout to auscultation.   Heart:  Regular rate and rhythm; no murmurs, clicks, rubs,  or gallops. Abdomen:  Soft, nontender and nondistended. Normal bowel sounds.   Neuro/Psych:  Alert and cooperative. Normal mood and affect. A and O x 3    No significant changes were identified.  The patient continues to be an appropriate candidate for the planned procedure and anesthesia.   Edman Circle, MD. [facility] 12/25/2022 3:03 PM@

## 2022-12-25 NOTE — Op Note (Signed)
[facility] Patient Name: Danny [name] Procedure Date: 12/25/2022 2:05 PM MRN: [id number] Endoscopist: Lynann Bologna , MD, [id number] Age: 62 Referring MD:  Date of Birth: 1960/11/20 Gender: Male Account #: [id number] Procedure:                Colonoscopy Indications:              Screening for colorectal malignant neoplasm. H/O                            Constipation. Medicines:                Monitored Anesthesia Care Procedure:                Pre-Anesthesia Assessment:                           - Prior to the procedure, a History and Physical                            was performed, and patient medications and                            allergies were reviewed. The patient's tolerance of                            previous anesthesia was also reviewed. The risks                            and benefits of the procedure and the sedation                            options and risks were discussed with the patient.                            All questions were answered, and informed consent                            was obtained. Prior Anticoagulants: The patient has                            taken Eliquis (apixaban), last dose was 2 days                            prior to procedure. ASA Grade Assessment: III - A                            patient with severe systemic disease. After                            reviewing the risks and benefits, the patient was                            deemed in satisfactory condition to undergo the  procedure.                           After obtaining informed consent, the colonoscope                            was passed under direct vision. Throughout the                            procedure, the patient's blood pressure, pulse, and                            oxygen saturations were monitored continuously. The                            CF [id number] #[id number] was introduced through the anus                             and advanced to the 2 cm into the ileum. The                            colonoscopy was performed without difficulty. The                            patient tolerated the procedure well. The quality                            of the bowel preparation was adequate to identify                            polyps greater than 5 mm in size. The terminal                            ileum, ileocecal valve, appendiceal orifice, and                            rectum were photographed. Scope In: 2:20:45 PM Scope Out: 2:31:04 PM Scope Withdrawal Time: 0 hours 7 minutes 37 seconds  Total Procedure Duration: 0 hours 10 minutes 19 seconds  Findings:                 A few small-mouthed diverticula were found in the                            sigmoid colon.                           Non-bleeding internal hemorrhoids were found during                            retroflexion and during perianal exam. The                            hemorrhoids were small and Grade I (internal  hemorrhoids that do not prolapse).                           The terminal ileum appeared normal.                           The exam was otherwise without abnormality on                            direct and retroflexion views. Complications:            No immediate complications. Estimated Blood Loss:     Estimated blood loss: none. Impression:               - Minimal sigmoid diverticulosis.                           - Non-bleeding internal hemorrhoids.                           - The examined portion of the ileum was normal.                           - The examination was otherwise normal on direct                            and retroflexion views.                           - No specimens collected. Recommendation:           - Patient has a contact number available for                            emergencies. The signs and symptoms of potential                            delayed complications were  discussed with the                            patient. Return to normal activities tomorrow.                            Written discharge instructions were provided to the                            patient.                           - Resume previous diet.                           - Continue present medications.                           - Resume Eliquis (apixaban) at prior dose today.                           -  Repeat screening colonoscopy in 10 years.                            Earlier, if with any new problems or change in                            family history. Note that he would need 2-day prep                            for future colonoscopies.                           - Miralax 1 capful (17 grams) in 8 ounces of water                            PO daily.                           - The findings and recommendations were discussed                            with the patient's family. Lynann Bologna, MD 12/25/2022 2:35:20 PM This report has been signed electronically.

## 2022-12-25 NOTE — Progress Notes (Signed)
Report to PACU, RN, vss, BBS= Clear.  

## 2022-12-25 NOTE — Patient Instructions (Addendum)
Continue present medications. Resume Eliquis (apixaban) at prior dose today. Repeat screening colonoscopy in 10 years. Earlier, if with any new problems or change in family history.  Miralax 1 capful (17 grams) in 8 ounces of water daily                             YOU HAD AN ENDOSCOPIC PROCEDURE TODAY AT THE [facility]:   Refer to the procedure report that was given to you for any specific questions about what was found during the examination.  If the procedure report does not answer your questions, please call your gastroenterologist to clarify.  If you requested that your care partner not be given the details of your procedure findings, then the procedure report has been included in a sealed envelope for you to review at your convenience later.  YOU SHOULD EXPECT: Some feelings of bloating in the abdomen. Passage of more gas than usual.  Walking can help get rid of the air that was put into your GI tract during the procedure and reduce the bloating. If you had a lower endoscopy (such as a colonoscopy or flexible sigmoidoscopy) you may notice spotting of blood in your stool or on the toilet paper. If you underwent a bowel prep for your procedure, you may not have a normal bowel movement for a few days.  Please Note:  You might notice some irritation and congestion in your nose or some drainage.  This is from the oxygen used during your procedure.  There is no need for concern and it should clear up in a day or so.  SYMPTOMS TO REPORT IMMEDIATELY:  Following lower endoscopy (colonoscopy or flexible sigmoidoscopy):  Excessive amounts of blood in the stool  Significant tenderness or worsening of abdominal pains  Swelling of the abdomen that is new, acute  Fever of 100F or higher  For urgent or emergent issues, a gastroenterologist can be reached at any hour by calling [phone]. Do not use MyChart messaging for urgent concerns.    DIET:  We do recommend a small meal at  first, but then you may proceed to your regular diet.  Drink plenty of fluids but you should avoid alcoholic beverages for 24 hours.  ACTIVITY:  You should plan to take it easy for the rest of today and you should NOT DRIVE or use heavy machinery until tomorrow (because of the sedation medicines used during the test).    FOLLOW UP: Our staff will call the number listed on your records the next business day following your procedure.  We will call around 7:15- 8:00 am to check on you and address any questions or concerns that you may have regarding the information given to you following your procedure. If we do not reach you, we will leave a message.      SIGNATURES/CONFIDENTIALITY: You and/or your care partner have signed paperwork which will be entered into your electronic medical record.  These signatures attest to the fact that that the information above on your After Visit Summary has been reviewed and is understood.  Full responsibility of the confidentiality of this discharge information lies with you and/or your care-partner.

## 2022-12-26 ENCOUNTER — Telehealth: Payer: Self-pay

## 2022-12-26 NOTE — Telephone Encounter (Signed)
Follow up call placed, VM obtained and message left. 

## 2023-01-08 ENCOUNTER — Other Ambulatory Visit: Payer: Self-pay | Admitting: Endocrinology

## 2023-01-21 ENCOUNTER — Other Ambulatory Visit: Payer: Self-pay | Admitting: Endocrinology

## 2023-02-05 ENCOUNTER — Other Ambulatory Visit (INDEPENDENT_AMBULATORY_CARE_PROVIDER_SITE_OTHER): Payer: BC Managed Care – PPO

## 2023-02-05 DIAGNOSIS — Z794 Long term (current) use of insulin: Secondary | ICD-10-CM

## 2023-02-05 DIAGNOSIS — E1165 Type 2 diabetes mellitus with hyperglycemia: Secondary | ICD-10-CM

## 2023-02-05 LAB — BASIC METABOLIC PANEL
BUN: 14 mg/dL (ref 6–23)
CO2: 26 mEq/L (ref 19–32)
Calcium: 9 mg/dL (ref 8.4–10.5)
Chloride: 102 mEq/L (ref 96–112)
Creatinine, Ser: 0.87 mg/dL (ref 0.40–1.50)
GFR: 91.47 mL/min (ref >60.00)
Glucose, Bld: 155 mg/dL — ABNORMAL HIGH (ref 70–99)
Potassium: 4.6 mEq/L (ref 3.5–5.1)
Sodium: 138 mEq/L (ref 135–145)

## 2023-02-05 LAB — HEMOGLOBIN A1C: Hgb A1c MFr Bld: 7.1 % — ABNORMAL HIGH (ref 4.6–6.5)

## 2023-02-08 ENCOUNTER — Other Ambulatory Visit: Payer: Self-pay | Admitting: Podiatry

## 2023-02-08 ENCOUNTER — Telehealth: Payer: Self-pay | Admitting: Podiatry

## 2023-02-08 ENCOUNTER — Ambulatory Visit: Payer: BC Managed Care – PPO | Admitting: Endocrinology

## 2023-02-08 ENCOUNTER — Encounter: Payer: Self-pay | Admitting: Endocrinology

## 2023-02-08 VITALS — BP 114/68 | HR 81 | Ht 71.0 in | Wt 226.6 lb

## 2023-02-08 DIAGNOSIS — Z794 Long term (current) use of insulin: Secondary | ICD-10-CM

## 2023-02-08 DIAGNOSIS — I10 Essential (primary) hypertension: Secondary | ICD-10-CM | POA: Diagnosis not present

## 2023-02-08 DIAGNOSIS — E1165 Type 2 diabetes mellitus with hyperglycemia: Secondary | ICD-10-CM | POA: Diagnosis not present

## 2023-02-08 NOTE — Progress Notes (Signed)
      February 08, 2023   [name] is a current patient of Dr. Levester Fresh at our office.  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  Dr. Annamary Rummage is currently managing this patient's neuropathy with prescription Lyrica 100 mg 3 times daily.  This is considered a long-term therapeutic drug approved for this condition.  He is due for reevaluation in approximately 3 months (02/27/2023).  Date of his last appointment was November 28, 2022.   Note was dictated by Dr. Lucia Estelle on behalf of Dr. Levester Fresh at his request.      [address] [place], [place] [address]                Office [phone]  Fax [phone]

## 2023-02-08 NOTE — Telephone Encounter (Signed)
pt needs a letter from the Dr stating that he was prescribed lyric by you dr. Annamary Rummage

## 2023-02-08 NOTE — Patient Instructions (Addendum)
Take am shot, 20 units 15 min before breakfast   Take 2 pills Xigduo at breakfast

## 2023-02-08 NOTE — Progress Notes (Signed)
Patient ID: Danny [name], male   DOB: October 08, 1960, 62 y.o.   MRN: [id number]    Reason for Appointment: Followup for Type 2 Diabetes  Referring physician: Drue Novel  History of Present Illness:          Diagnosis: Type 2 diabetes mellitus, date of diagnosis: 1992        Past history:  He has had long-standing diabetes and previously was treated with metformin, Amaryl, Januvia and Actos He was taken off metformin because of fear of liver problems.  May have been taken taken off Actos because of swelling of his legs but was on 45 mg at that time Over the last 2 years at least his A1c has been consistently over 8% He was started on insulin in 2011 probably when his A1c was over 9% Initially was given Lantus and Humalog and subsequently switched to 70/30 twice a day for unknown reasons Has not taken Byetta or Victoza in the past He had been on 70/30 insulin initially for a few years Previously A1c was 8% or more consistently before starting Invokamet. He was switched from Lantus to [place] in 1/17  Recent history:   INSULIN regimen is described as:  Tresiba 50 units at 9 pm  Novolog 18 units before meals   Oral hypoglycemic drugs the patient is taking are: Xigduo, 10/998, 1 tablet twice a day, Ozempic 1 mg weekly  His A1c has been usually in the upper normal range, now 7.1    Current management, blood sugar patterns and problems identified: His A1c is relatively higher again even though recently his blood sugars are improving with GMI 6.7 He thinks his blood sugars were higher when he had COVID infection about a month ago He has not changed his basal insulin However needing significant amount of mealtime insulin in the mornings possibly from some dawn phenomenon Not clear why his blood sugars are tending to be higher after breakfast and late afternoon in the second week of the data Postprandial readings are controlled with his needing mealtime dose only occasionally at  lunch and he takes the dinnertime dose only if the blood sugars are high Premeal Takes the Xigduo twice a day as prescribed        Side effects from medications have been: None    Blood Glucose patterns from [facility] download for the last 2 weeks   Overnight blood sugars are well-controlled with some variability hypoglycemia indicated on 2 nights in the first week although on 1 occasion sensor connection was not good  Usually has some tendency to rising blood sugars in the mornings especially in the second week Blood sugars are also high Premeal at dinnertime especially in the second week when they are increasing after about 3 PM when the average is about 1 60-1 70 POSTPRANDIAL readings are rising after breakfast to upper end of target range in the second week only Overall postprandial readings after the lunch meal are relatively even Blood sugars are variable after dinner with glucose readings usually improving to about 1 50-1 60 postprandially No hypoglycemia during the day  CGM use % of time   2-week average/GV 142/25  Time in range        % 84  % Time Above 180 15  % Time above 250   % Time Below 70 1      Previously:   CGM use % of time 99  2-week average/GV 133/25  Time  in range       90%  % Time Above 180 9  % Time above 250   % Time Below 70 1      Glycemic control:   Lab Results  Component Value Date   HGBA1C 7.1 (H) 02/05/2023   HGBA1C 6.8 (H) 10/20/2022   HGBA1C 6.4 (A) 06/22/2022   Lab Results  Component Value Date   MICROALBUR <0.7 03/20/2022   LDLCALC 4 03/30/2022   CREATININE 0.87 02/05/2023    Self-care:  Meals: 3 meals per day. Breakfast is eggs or cereal usually,  controlling portions      Dietician visit: Most recent: years ago.                 Weight history:  Wt Readings from Last 3 Encounters:  02/08/23 226 lb 9.6 oz (102.8 kg)  12/25/22 223 lb (101.2 kg)  10/24/22 223 lb (101.2 kg)    Lab on 02/05/2023  Component Date Value Ref  Range Status   Sodium 02/05/2023 138  135 - 145 mEq/L Final   Potassium 02/05/2023 4.6  3.5 - 5.1 mEq/L Final   Chloride 02/05/2023 102  96 - 112 mEq/L Final   CO2 02/05/2023 26  19 - 32 mEq/L Final   Glucose, Bld 02/05/2023 155 (H)  70 - 99 mg/dL Final   BUN 08/65/7846 14  6 - 23 mg/dL Final   Creatinine, Ser 02/05/2023 0.87  0.40 - 1.50 mg/dL Final   GFR 96/29/5284 91.47  >60.00 mL/min Final   Calculated using the CKD-EPI Creatinine Equation (2021)   Calcium 02/05/2023 9.0  8.4 - 10.5 mg/dL Final   Hgb X3K MFr Bld 02/05/2023 7.1 (H)  4.6 - 6.5 % Final   Glycemic Control Guidelines for People with Diabetes:Non Diabetic:  <6%Goal of Therapy: <7%Additional Action Suggested:  >8%     Allergies as of 02/08/2023   No Known Allergies      Medication List        Accurate as of February 08, 2023 11:57 AM. If you have any questions, ask your nurse or doctor.          STOP taking these medications    fish oil-omega-3 fatty acids 1000 MG capsule Stopped by: Reather Littler   HYDROcodone bit-homatropine 5-1.5 MG/5ML syrup Commonly known as: HYCODAN Stopped by: Reather Littler       TAKE these medications    acetaminophen 650 MG CR tablet Commonly known as: TYLENOL Take 650 mg by mouth every 8 (eight) hours as needed for pain.   azelastine 0.1 % nasal spray Commonly known as: ASTELIN Place 2 sprays into both nostrils 2 (two) times daily. Use in each nostril as directed   Baqsimi One Pack 3 MG/DOSE Powd Generic drug: Glucagon To be sprayed in nostril as needed for severe hypoglycemia   BD Veo Insulin Syringe U/F 31G X 15/64" 0.5 ML Misc Generic drug: Insulin Syringe-Needle U-100 USE 1 SYRINGE AS DIRECTED 3 TO 4 TIMES DAILY   Dexcom G7 Sensor Misc USE AS DIRECTED. CHANGE EVERY 10 DAYS   dicyclomine 10 MG capsule Commonly known as: BENTYL TAKE 1 CAPSULE(10 MG) BY MOUTH FOUR TIMES DAILY BEFORE MEALS AND AT BEDTIME   Eliquis 5 MG Tabs tablet Generic drug: apixaban TAKE 1  TABLET(5 MG) BY MOUTH TWICE DAILY   fluticasone 50 MCG/ACT nasal spray Commonly known as: FLONASE SHAKE LIQUID AND USE 2 SPRAYS IN EACH NOSTRIL DAILY AS NEEDED FOR ALLERGIES OR RHINITIS   irbesartan 150 MG  tablet Commonly known as: AVAPRO Take 1 tablet by mouth once daily   loratadine 10 MG tablet Commonly known as: CLARITIN Take 10 mg by mouth daily.   multivitamin with minerals tablet Take 1 tablet by mouth daily.   nitroGLYCERIN 0.4 MG SL tablet Commonly known as: NITROSTAT DISSOLVE ONE TABLET UNDER THE TONGUE EVERY 5 MINUTES AS NEEDED FOR CHEST PAIN.  DO NOT EXCEED A TOTAL OF 3 DOSES IN 15 MINUTES   NovoLOG FlexPen 100 UNIT/ML FlexPen Generic drug: insulin aspart INJECT 10 TO 12 UNITS SUBCUTANEOUSLY THREE TIMES DAILY BEFORE MEAL(S)   OneTouch Delica Plus Lancet33G Misc Use to check blood sugar 4 times per day   OneTouch Verio Reflect w/Device Kit 1   OneTouch Verio test strip Generic drug: glucose blood USE AS DIRECTED TO CHECK BLOOD SUGAR 4 TIMES DAILY   Ozempic (1 MG/DOSE) 4 MG/3ML Sopn Generic drug: Semaglutide (1 MG/DOSE) INJECT 1 DOSE SUBCUTANEOUSLY ONCE A WEEK   pantoprazole 40 MG tablet Commonly known as: PROTONIX Take 1 tablet (40 mg total) by mouth daily. Take 30 minutes prior to eating.   Polyethyl Glyc-Propyl Glyc PF 0.4-0.3 % Soln Apply 2 drops to eye 3 (three) times daily as needed (for dry eyes).   pregabalin 100 MG capsule Commonly known as: Lyrica Take 1 capsule (100 mg total) by mouth 3 (three) times daily. What changed: Another medication with the same name was removed. Continue taking this medication, and follow the directions you see here. Changed by: Reather Littler   PROBIOTIC DAILY PO Take 1 capsule by mouth daily.   rosuvastatin 40 MG tablet Commonly known as: CRESTOR Take 1 tablet by mouth once daily   silodosin 8 MG Caps capsule Commonly known as: RAPAFLO Take 8 mg by mouth daily with breakfast.   Evaristo Bury FlexTouch 100 UNIT/ML  FlexTouch Pen Generic drug: insulin degludec INJECT 42 UNITS SUBCUTANEOUSLY ONCE DAILY   Xigduo XR 10-998 MG Tb24 Generic drug: Dapagliflozin Pro-metFORMIN ER Take 1 tablet by mouth twice daily        Allergies:  No Known Allergies  Past Medical History:  Diagnosis Date   Allergic rhinitis    Benign prostatic hypertrophy    s/p TUNA procedure aprx 2006,   Carpal tunnel syndrome 06/17/2009   Diabetes mellitus    Type II dx 1999   DM type 2 with diabetic peripheral neuropathy (HCC) 10/15/2014   ED (erectile dysfunction)    After TUNA   Elevated LFTs 2010   (-) chronic hep  panel   History of cardiovascular stress test 8/11   Negative   Hyperlipidemia    HYPERSOMNIA UNSPECIFIED 12/11/2007   Hypertension    Obesity    PAF (paroxysmal atrial fibrillation) (HCC) 12/21/2009   a. Failed Multaq. b. Prev on flecainide. c. s/p afib ablation 2015.    Past Surgical History:  Procedure Laterality Date   ABLATION  03/24/2014   PVI by Dr Johney Frame   ATRIAL FIBRILLATION ABLATION N/A 03/24/2014   Procedure: ATRIAL FIBRILLATION ABLATION;  Surgeon: Gardiner Rhyme, MD;  Location: [facility] CATH LAB;  Service: Cardiovascular;  Laterality: N/A;   COLONOSCOPY  11/25/2012   Mild sigmoid diverticulosis, small internal hemorrhoids, otherwise normal colonoscopy   CYSTOSCOPY  04/26/2008   neg  @ urology, was rx Flomax   LEFT HEART CATH AND CORONARY ANGIOGRAPHY N/A 08/30/2021   Procedure: LEFT HEART CATH AND CORONARY ANGIOGRAPHY;  Surgeon: Lennette Bihari, MD;  Location: [facility] INVASIVE CV LAB;  Service: Cardiovascular;  Laterality: N/A;  PROSTATE SURGERY     TUNA   TEE WITHOUT CARDIOVERSION N/A 03/23/2014   Procedure: TRANSESOPHAGEAL ECHOCARDIOGRAM (TEE);  Surgeon: Pricilla Riffle, MD;  Location: [facility] ENDOSCOPY;  Service: Cardiovascular;  Laterality: N/A;   VASECTOMY     several years ago    Family History  Problem Relation Age of Onset   Heart disease Mother    Diabetes Mother    Hypertension Mother     Congestive Heart Failure Mother    Heart attack Mother    Hypertension Father    Colon cancer Neg Hx    Prostate cancer Neg Hx    Stomach cancer Neg Hx    Rectal cancer Neg Hx     Social History:  reports that he quit smoking about 37 years ago. His smoking use included cigarettes. He has never used smokeless tobacco. He reports that he does not drink alcohol and does not use drugs.    Review of Systems       Lipids: He has been on Crestor 40 mg and followed by cardiology  On fish oil for high triglycerides  Labs as below:      Lab Results  Component Value Date   CHOL 63 03/30/2022   HDL 33.90 (L) 03/30/2022   LDLCALC 4 03/30/2022   LDLDIRECT 62.0 03/29/2021   TRIG 125.0 03/30/2022   CHOLHDL 2 03/30/2022                Hypertension, mild and consistently controlled  Treated with Avapro 150 mg from his cardiologist  BP Readings from Last 3 Encounters:  02/08/23 114/68  12/25/22 127/73  10/24/22 (!) 120/58     Neuropathy: Has longstanding history of Numbness, pins-and-needle sensation and stinging in feet and lower legs He has been followed by neurologist, taking Lyrica 75 mg, 3 times daily prescribed by his podiatrist Symptoms are relieved  No sensory loss on exam     LABS:  Lab on 02/05/2023  Component Date Value Ref Range Status   Sodium 02/05/2023 138  135 - 145 mEq/L Final   Potassium 02/05/2023 4.6  3.5 - 5.1 mEq/L Final   Chloride 02/05/2023 102  96 - 112 mEq/L Final   CO2 02/05/2023 26  19 - 32 mEq/L Final   Glucose, Bld 02/05/2023 155 (H)  70 - 99 mg/dL Final   BUN 78/29/5621 14  6 - 23 mg/dL Final   Creatinine, Ser 02/05/2023 0.87  0.40 - 1.50 mg/dL Final   GFR 30/86/5784 91.47  >60.00 mL/min Final   Calculated using the CKD-EPI Creatinine Equation (2021)   Calcium 02/05/2023 9.0  8.4 - 10.5 mg/dL Final   Hgb O9G MFr Bld 02/05/2023 7.1 (H)  4.6 - 6.5 % Final   Glycemic Control Guidelines for People with Diabetes:Non Diabetic:  <6%Goal of  Therapy: <7%Additional Action Suggested:  >8%     Physical Examination:  BP 114/68   Pulse 81   Ht 5\' 11"  (1.803 m)   Wt 226 lb 9.6 oz (102.8 kg)   SpO2 93%   BMI 31.60 kg/m    ASSESSMENT/PLAN:    Diabetes type 2 on insulin  See history of present illness for detailed discussion of current diabetes management, blood sugar patterns and problems identified  His A1c is 7.1 compared to 6.8  He is on basal bolus insulin with Ozempic 1 mg and Xigduo  His A1c has gone up and he appears to be needing somewhat more mealtime insulin lately Also likely at higher readings when  he had COVID infection about a month ago Most of his insulin management is appropriate Overnight blood sugars are fairly good with stable doses of 50 units of Tresiba Again having some difficulty losing weight  Mild hypertension: Well-controlled  Lipids: Needs follow-up labs from his cardiologist  Plan:  He will try to take his morning at least 15 minutes before breakfast to get better control and avoid the dawn phenomenon also No change in [place] If his blood sugars continue to be rising a lot in the late afternoon may need additional NovoLog when he comes back from work If not very active he can take coverage for his lunch or dinner also Continue Ozempic Regular exercise if not very active at work   Patient Instructions  Take am shot, 20 units 15 min before breakfast   Take 2 pills Xigduo at breakfast      Reather Littler 02/08/2023, 11:57 AM   Note: This office note was prepared with Dragon voice recognition system technology. Any transcriptional errors that result from this process are unintentional. .

## 2023-02-09 ENCOUNTER — Encounter: Payer: Self-pay | Admitting: Endocrinology

## 2023-02-14 ENCOUNTER — Other Ambulatory Visit: Payer: Self-pay | Admitting: Endocrinology

## 2023-02-16 NOTE — Progress Notes (Unsigned)
HPI: FU atrial fibrillation.he previously failed to Multaq.  Patient had ablation of his atrial fibrillation in September 2015 by Dr. Johney Frame.  Patient seen recently with complaints of near syncope and chest discomfort.  Monitor February 2023 showed no atrial fibrillation and rare supraventricular and ventricular ectopy.  Cardiac CTA February 2023 showed calcium score 2233 which was 99th percentile.  Echocardiogram September 2015 showed normal LV function and no significant valvular disease.  Carotid Dopplers February 2023 showed no significant stenosis.  Echocardiogram February 2023 showed normal LV function.  Cardiac catheterization March 2023 showed 40% mid RCA, 50% mid LAD and mildly elevated left ventricular end-diastolic pressure; ejection fraction 55 to 65%.  Since he was last seen,   Current Outpatient Medications  Medication Sig Dispense Refill   acetaminophen (TYLENOL) 650 MG CR tablet Take 650 mg by mouth every 8 (eight) hours as needed for pain.     azelastine (ASTELIN) 0.1 % nasal spray Place 2 sprays into both nostrils 2 (two) times daily. Use in each nostril as directed 30 mL 5   BD VEO INSULIN SYRINGE U/F 31G X 15/64" 0.5 ML MISC USE 1 SYRINGE AS DIRECTED 3 TO 4 TIMES DAILY 100 each 0   Blood Glucose Monitoring Suppl (ONETOUCH VERIO REFLECT) w/Device KIT 1 1 kit 0   Continuous Glucose Sensor (DEXCOM G7 SENSOR) MISC USE AS DIRECTED. CHANGE EVERY 10 DAYS 3 each 3   Dapagliflozin Pro-metFORMIN ER (XIGDUO XR) 10-998 MG TB24 Take 1 tablet by mouth twice daily 180 tablet 1   dicyclomine (BENTYL) 10 MG capsule TAKE 1 CAPSULE(10 MG) BY MOUTH FOUR TIMES DAILY BEFORE MEALS AND AT BEDTIME 120 capsule 11   ELIQUIS 5 MG TABS tablet TAKE 1 TABLET(5 MG) BY MOUTH TWICE DAILY 60 tablet 5   fluticasone (FLONASE) 50 MCG/ACT nasal spray SHAKE LIQUID AND USE 2 SPRAYS IN EACH NOSTRIL DAILY AS NEEDED FOR ALLERGIES OR RHINITIS 16 g 5   Glucagon (BAQSIMI ONE PACK) 3 MG/DOSE POWD To be sprayed in nostril  as needed for severe hypoglycemia 1 each 1   glucose blood (ONETOUCH VERIO) test strip USE AS DIRECTED TO CHECK BLOOD SUGAR 4 TIMES DAILY 125 each 2   irbesartan (AVAPRO) 150 MG tablet Take 1 tablet by mouth once daily 90 tablet 2   Lancets (ONETOUCH DELICA PLUS LANCET33G) MISC Use to check blood sugar 4 times per day 200 each 2   loratadine (CLARITIN) 10 MG tablet Take 10 mg by mouth daily.     Multiple Vitamins-Minerals (MULTIVITAMIN WITH MINERALS) tablet Take 1 tablet by mouth daily.     nitroGLYCERIN (NITROSTAT) 0.4 MG SL tablet DISSOLVE ONE TABLET UNDER THE TONGUE EVERY 5 MINUTES AS NEEDED FOR CHEST PAIN.  DO NOT EXCEED A TOTAL OF 3 DOSES IN 15 MINUTES 25 tablet 0   NOVOLOG FLEXPEN 100 UNIT/ML FlexPen INJECT 10 TO 12 UNITS SUBCUTANEOUSLY THREE TIMES DAILY BEFORE MEAL(S) 15 mL 1   OZEMPIC, 1 MG/DOSE, 4 MG/3ML SOPN INJECT 1 DOSE SUBCUTANEOUSLY ONCE A WEEK 3 mL 0   pantoprazole (PROTONIX) 40 MG tablet Take 1 tablet (40 mg total) by mouth daily. Take 30 minutes prior to eating. 90 tablet 3   Polyethyl Glyc-Propyl Glyc PF 0.4-0.3 % SOLN Apply 2 drops to eye 3 (three) times daily as needed (for dry eyes).     pregabalin (LYRICA) 100 MG capsule Take 1 capsule (100 mg total) by mouth 3 (three) times daily. 90 capsule 2   Probiotic Product (PROBIOTIC DAILY PO)  Take 1 capsule by mouth daily.     rosuvastatin (CRESTOR) 40 MG tablet Take 1 tablet by mouth once daily 90 tablet 3   silodosin (RAPAFLO) 8 MG CAPS capsule Take 8 mg by mouth daily with breakfast.     TRESIBA FLEXTOUCH 100 UNIT/ML FlexTouch Pen INJECT 42 UNITS SUBCUTANEOUSLY ONCE DAILY 15 mL 1   No current facility-administered medications for this visit.     Past Medical History:  Diagnosis Date   Allergic rhinitis    Benign prostatic hypertrophy    s/p TUNA procedure aprx 2006,   Carpal tunnel syndrome 06/17/2009   Diabetes mellitus    Type II dx 1999   DM type 2 with diabetic peripheral neuropathy (HCC) 10/15/2014   ED (erectile  dysfunction)    After TUNA   Elevated LFTs 2010   (-) chronic hep  panel   History of cardiovascular stress test 8/11   Negative   Hyperlipidemia    HYPERSOMNIA UNSPECIFIED 12/11/2007   Hypertension    Obesity    PAF (paroxysmal atrial fibrillation) (HCC) 12/21/2009   a. Failed Multaq. b. Prev on flecainide. c. s/p afib ablation 2015.    Past Surgical History:  Procedure Laterality Date   ABLATION  03/24/2014   PVI by Dr Johney Frame   ATRIAL FIBRILLATION ABLATION N/A 03/24/2014   Procedure: ATRIAL FIBRILLATION ABLATION;  Surgeon: Gardiner Rhyme, MD;  Location: [facility] CATH LAB;  Service: Cardiovascular;  Laterality: N/A;   COLONOSCOPY  11/25/2012   Mild sigmoid diverticulosis, small internal hemorrhoids, otherwise normal colonoscopy   CYSTOSCOPY  04/26/2008   neg  @ urology, was rx Flomax   LEFT HEART CATH AND CORONARY ANGIOGRAPHY N/A 08/30/2021   Procedure: LEFT HEART CATH AND CORONARY ANGIOGRAPHY;  Surgeon: Lennette Bihari, MD;  Location: [facility] INVASIVE CV LAB;  Service: Cardiovascular;  Laterality: N/A;   PROSTATE SURGERY     TUNA   TEE WITHOUT CARDIOVERSION N/A 03/23/2014   Procedure: TRANSESOPHAGEAL ECHOCARDIOGRAM (TEE);  Surgeon: Pricilla Riffle, MD;  Location: [facility] ENDOSCOPY;  Service: Cardiovascular;  Laterality: N/A;   VASECTOMY     several years ago    Social History   Socioeconomic History   Marital status: Married    Spouse name: Not on file   Number of children: 3   Years of education: GED   Highest education level: Not on file  Occupational History   Occupation: Works for [employer] (state).  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[employer]    Comment: works for [employer] (state)  Tobacco Use   Smoking status: Former    Current packs/day: 0.00    Types: Cigarettes    Quit date: 06/26/1985    Years since quitting: 37.6   Smokeless tobacco: Never   Tobacco comments:    Former smoker  Advertising account planner   Vaping status: Never Used  Substance and Sexual Activity   Alcohol use: No    Comment:      Drug use: No   Sexual activity: Not on file  Other Topics Concern   Not on file  Social History Narrative   Lives with wife in a one story   Right handed    One story home   3 children         Social Determinants of Health   Financial Resource Strain: Not on file  Food Insecurity: Low Risk  (01/08/2023)   Received from [facility]   Food vital sign    Within the past 12  months, you worried that your food would run out before you got money to buy more: Never true    Within the past 12 months, the food you bought just didn't last and you didn't have money to get more. : Never true  Transportation Needs: Not on file (01/08/2023)  Physical Activity: Not on file  Stress: Not on file  Social Connections: Not on file  Intimate Partner Violence: Not on file    Family History  Problem Relation Age of Onset   Heart disease Mother    Diabetes Mother    Hypertension Mother    Congestive Heart Failure Mother    Heart attack Mother    Hypertension Father    Colon cancer Neg Hx    Prostate cancer Neg Hx    Stomach cancer Neg Hx    Rectal cancer Neg Hx     ROS: no fevers or chills, productive cough, hemoptysis, dysphasia, odynophagia, melena, hematochezia, dysuria, hematuria, rash, seizure activity, orthopnea, PND, pedal edema, claudication. Remaining systems are negative.  Physical Exam: Well-developed well-nourished in no acute distress.  Skin is warm and dry.  HEENT is normal.  Neck is supple.  Chest is clear to auscultation with normal expansion.  Cardiovascular exam is regular rate and rhythm.  Abdominal exam nontender or distended. No masses palpated. Extremities show no edema. neuro grossly intact  ECG- personally reviewed  A/P  1 paroxysmal atrial fibrillation-status post ablation.  No recurrences.  Continue apixaban.  2 hypertension-blood pressure controlled.  Continue present medications.  3 coronary artery disease-nonobstructive on prior catheterization.   Continue statin.  No aspirin given need for anticoagulation.  4 hyperlipidemia-continue statin.  5 history of near syncope-no recurrences.  Will follow.  Olga Millers, MD

## 2023-02-22 ENCOUNTER — Ambulatory Visit: Payer: BC Managed Care – PPO | Attending: Cardiology | Admitting: Cardiology

## 2023-02-22 ENCOUNTER — Encounter: Payer: Self-pay | Admitting: Cardiology

## 2023-02-22 VITALS — BP 130/70 | HR 61 | Ht 71.0 in | Wt 221.0 lb

## 2023-02-22 DIAGNOSIS — E7800 Pure hypercholesterolemia, unspecified: Secondary | ICD-10-CM | POA: Diagnosis not present

## 2023-02-22 DIAGNOSIS — I2510 Atherosclerotic heart disease of native coronary artery without angina pectoris: Secondary | ICD-10-CM

## 2023-02-22 DIAGNOSIS — I480 Paroxysmal atrial fibrillation: Secondary | ICD-10-CM

## 2023-02-22 DIAGNOSIS — I10 Essential (primary) hypertension: Secondary | ICD-10-CM | POA: Diagnosis not present

## 2023-02-22 NOTE — Patient Instructions (Signed)
Medication Instructions:  No changes *If you need a refill on your cardiac medications before your next appointment, please call your pharmacy*   Lab Work: Lipid, Liver, CBC, BMP- today If you have labs (blood work) drawn today and your tests are completely normal, you will receive your results only by: MyChart Message (if you have MyChart) OR A paper copy in the mail If you have any lab test that is abnormal or we need to change your treatment, we will call you to review the results.  Follow-Up: At [facility], you and your health needs are our priority.  As part of our continuing mission to provide you with exceptional heart care, we have created designated Provider Care Teams.  These Care Teams include your primary Cardiologist (physician) and Advanced Practice Providers (APPs -  Physician Assistants and Nurse Practitioners) who all work together to provide you with the care you need, when you need it.  We recommend signing up for the patient portal called "MyChart".  Sign up information is provided on this After Visit Summary.  MyChart is used to connect with patients for Virtual Visits (Telemedicine).  Patients are able to view lab/test results, encounter notes, upcoming appointments, etc.  Non-urgent messages can be sent to your provider as well.   To learn more about what you can do with MyChart, go to [email or web address].    Your next appointment:   1 year(s)  Provider:   Olga Millers, MD

## 2023-02-23 LAB — HEPATIC FUNCTION PANEL
ALT: 45 IU/L — ABNORMAL HIGH (ref 0–44)
AST: 26 IU/L (ref 0–40)
Albumin: 4.5 g/dL (ref 3.9–4.9)
Alkaline Phosphatase: 90 IU/L (ref 44–121)
Bilirubin Total: 0.3 mg/dL (ref 0.0–1.2)
Bilirubin, Direct: 0.13 mg/dL (ref 0.00–0.40)
Total Protein: 7.1 g/dL (ref 6.0–8.5)

## 2023-02-23 LAB — CBC
Hematocrit: 46.1 % (ref 37.5–51.0)
Hemoglobin: 14.9 g/dL (ref 13.0–17.7)
MCH: 27.1 pg (ref 26.6–33.0)
MCHC: 32.3 g/dL (ref 31.5–35.7)
MCV: 84 fL (ref 79–97)
Platelets: 251 10*3/uL (ref 150–450)
RBC: 5.5 x10E6/uL (ref 4.14–5.80)
RDW: 13.7 % (ref 11.6–15.4)
WBC: 5.2 10*3/uL (ref 3.4–10.8)

## 2023-02-23 LAB — LIPID PANEL
Chol/HDL Ratio: 2.2 ratio (ref 0.0–5.0)
Cholesterol, Total: 67 mg/dL — ABNORMAL LOW (ref 100–199)
HDL: 31 mg/dL — ABNORMAL LOW (ref >39)
LDL Chol Calc (NIH): 17 mg/dL (ref 0–99)
Triglycerides: 98 mg/dL (ref 0–149)
VLDL Cholesterol Cal: 19 mg/dL (ref 5–40)

## 2023-02-23 LAB — BASIC METABOLIC PANEL
BUN/Creatinine Ratio: 20 (ref 10–24)
BUN: 16 mg/dL (ref 8–27)
CO2: 28 mmol/L (ref 20–29)
Calcium: 9.7 mg/dL (ref 8.6–10.2)
Chloride: 104 mmol/L (ref 96–106)
Creatinine, Ser: 0.8 mg/dL (ref 0.76–1.27)
Glucose: 92 mg/dL (ref 70–99)
Potassium: 5.3 mmol/L — ABNORMAL HIGH (ref 3.5–5.2)
Sodium: 142 mmol/L (ref 134–144)
eGFR: 99 mL/min/{1.73_m2} (ref >59)

## 2023-02-26 ENCOUNTER — Other Ambulatory Visit: Payer: Self-pay | Admitting: Endocrinology

## 2023-02-27 ENCOUNTER — Encounter: Payer: Self-pay | Admitting: Podiatry

## 2023-02-27 ENCOUNTER — Ambulatory Visit (INDEPENDENT_AMBULATORY_CARE_PROVIDER_SITE_OTHER): Payer: BC Managed Care – PPO | Admitting: Podiatry

## 2023-02-27 DIAGNOSIS — M79675 Pain in left toe(s): Secondary | ICD-10-CM | POA: Diagnosis not present

## 2023-02-27 DIAGNOSIS — M792 Neuralgia and neuritis, unspecified: Secondary | ICD-10-CM | POA: Diagnosis not present

## 2023-02-27 DIAGNOSIS — M7742 Metatarsalgia, left foot: Secondary | ICD-10-CM

## 2023-02-27 DIAGNOSIS — E0840 Diabetes mellitus due to underlying condition with diabetic neuropathy, unspecified: Secondary | ICD-10-CM

## 2023-02-27 DIAGNOSIS — Z794 Long term (current) use of insulin: Secondary | ICD-10-CM

## 2023-02-27 DIAGNOSIS — L84 Corns and callosities: Secondary | ICD-10-CM | POA: Diagnosis not present

## 2023-02-27 DIAGNOSIS — B351 Tinea unguium: Secondary | ICD-10-CM

## 2023-02-27 DIAGNOSIS — M79674 Pain in right toe(s): Secondary | ICD-10-CM | POA: Diagnosis not present

## 2023-02-27 DIAGNOSIS — M7741 Metatarsalgia, right foot: Secondary | ICD-10-CM | POA: Diagnosis not present

## 2023-02-27 NOTE — Progress Notes (Signed)
  Subjective:  Patient ID: Danny [name], male    DOB: Oct 29, 1960,  MRN: [id number]  Chief Complaint  Patient presents with   Nail Problem    Diabetic Foot Care-nail trim     62 y.o. male presents with the above complaint. History confirmed with patient. Patient presenting with pain related to dystrophic thickened elongated nails. Patient is unable to trim own nails related to nail dystrophy and/or mobility issues. Patient does  have a history of T2DM. Patient does have callus present located at the plantar 1st MPJ left foot as well as the fifth metatarsal head bilaterally that have previously been debrided however they are not prominent today and no causing pain he uses a pumice stone to grind them down.  Patient is interested in getting diabetic liners and shoes with  Objective:  Physical Exam: warm, good capillary refill nail exam onychomycosis of the toenails, onycholysis, and dystrophic nails DP pulses palpable, PT pulses palpable, and protective sensation intact Left Foot:  Pain with palpation of nails due to elongation and dystrophic growth.  Hyperkeratotic lesion sub one and fifth metatarsal heads very mild Right Foot: Pain with palpation of nails due to elongation and dystrophic growth.  Right foot with hyperkeratotic lesion subfifth metatarsal head very mild  Assessment:   1. Neuropathic pain   2. Pain due to onychomycosis of toenails of both feet   3. Pre-ulcerative calluses   4. Metatarsalgia of both feet   5. Diabetes mellitus due to underlying condition, controlled, with diabetic neuropathy, with long-term current use of insulin (HCC)         Plan:  Patient was evaluated and treated and all questions answered.  # Peripheral neuropathy and neuropathic pain -Pt states improved on higher dosage  -Continue on lyrica 100 mg TID  -I certify that this diagnosis represents a distinct and separate diagnosis that requires evaluation and treatment separate from other  procedures or diagnosis   # Metatarsalgia and preulcerative calluses with DM2 with mild neuropathy # Planter fasciitis -Recommend diabetic shoes and liners to be fabricated for prevention of pressure ulcerations for the bilateral foot -Patient also has plantar fasciitis and says that the liners help prevent irritation in the bottom of the heel -I certify that this diagnosis represents a distinct and separate diagnosis that requires evaluation and treatment separate from other procedures or diagnosis   #Onychomycosis with pain  -Nails palliatively debrided as below. -Educated on self-care  Procedure: Nail Debridement Rationale: Pain Type of Debridement: manual, sharp debridement. Instrumentation: Nail nipper, rotary burr. Number of Nails: 10  Return in about 3 months (around 05/29/2023) for [facility].         Corinna Gab, DPM [facility] / [facility]

## 2023-03-18 ENCOUNTER — Other Ambulatory Visit: Payer: Self-pay | Admitting: Podiatry

## 2023-03-20 ENCOUNTER — Ambulatory Visit: Payer: BC Managed Care – PPO

## 2023-03-20 LAB — HM DIABETES FOOT EXAM

## 2023-03-20 NOTE — Progress Notes (Unsigned)
Patient presents to the office today for diabetic shoe and insole measuring.  Patient was measured with brannock device to determine size and width for 1 pair of extra depth shoes and foam casted for 3 pair of insoles.   Documentation of medical necessity will be sent to patient's treating diabetic doctor to verify and sign.   Patient's diabetic provider: [name]   Shoes and insoles will be ordered at that time and patient will be notified for an appointment for fitting when they arrive.   Shoe size (per patient): 9.5MD Brannock measurement: 9.5 Patient shoe selection- Shoe choice:   681 / 682  Composite toe  Shoe size ordered: 9.5MD Financial Form signed  Addison Bailey Cped, CFo, CFm  Impressions to be scanned and sent to ST when back In [place]  [employer], CFo, CFm

## 2023-03-21 ENCOUNTER — Encounter: Payer: Self-pay | Admitting: Podiatry

## 2023-04-02 ENCOUNTER — Encounter: Payer: BC Managed Care – PPO | Admitting: Internal Medicine

## 2023-04-05 ENCOUNTER — Other Ambulatory Visit: Payer: Self-pay

## 2023-04-05 DIAGNOSIS — E1165 Type 2 diabetes mellitus with hyperglycemia: Secondary | ICD-10-CM

## 2023-04-05 MED ORDER — OZEMPIC (1 MG/DOSE) 4 MG/3ML ~~LOC~~ SOPN
PEN_INJECTOR | SUBCUTANEOUS | 0 refills | Status: DC
Start: 2023-04-05 — End: 2023-04-12

## 2023-04-06 ENCOUNTER — Other Ambulatory Visit: Payer: Self-pay | Admitting: Internal Medicine

## 2023-04-06 ENCOUNTER — Telehealth: Payer: Self-pay

## 2023-04-06 NOTE — Telephone Encounter (Signed)
Contact patient to schedule appointment with new provider

## 2023-04-07 ENCOUNTER — Other Ambulatory Visit: Payer: Self-pay | Admitting: Internal Medicine

## 2023-04-08 NOTE — Progress Notes (Signed)
Shoes and inserts in Appt for fitting set 10/28 2:30  Addison Bailey Cped

## 2023-04-09 ENCOUNTER — Encounter: Payer: Self-pay | Admitting: Internal Medicine

## 2023-04-09 ENCOUNTER — Other Ambulatory Visit (INDEPENDENT_AMBULATORY_CARE_PROVIDER_SITE_OTHER): Payer: BC Managed Care – PPO

## 2023-04-09 DIAGNOSIS — E1165 Type 2 diabetes mellitus with hyperglycemia: Secondary | ICD-10-CM

## 2023-04-09 DIAGNOSIS — Z794 Long term (current) use of insulin: Secondary | ICD-10-CM | POA: Diagnosis not present

## 2023-04-09 LAB — BASIC METABOLIC PANEL
BUN: 9 mg/dL (ref 6–23)
CO2: 28 meq/L (ref 19–32)
Calcium: 9.3 mg/dL (ref 8.4–10.5)
Chloride: 105 meq/L (ref 96–112)
Creatinine, Ser: 0.82 mg/dL (ref 0.40–1.50)
GFR: 93.01 mL/min (ref >60.00)
Glucose, Bld: 119 mg/dL — ABNORMAL HIGH (ref 70–99)
Potassium: 4.4 meq/L (ref 3.5–5.1)
Sodium: 141 meq/L (ref 135–145)

## 2023-04-09 LAB — HEMOGLOBIN A1C: Hgb A1c MFr Bld: 6.8 % — ABNORMAL HIGH (ref 4.6–6.5)

## 2023-04-10 ENCOUNTER — Ambulatory Visit: Payer: BC Managed Care – PPO | Admitting: Internal Medicine

## 2023-04-10 ENCOUNTER — Encounter: Payer: Self-pay | Admitting: Internal Medicine

## 2023-04-10 VITALS — BP 116/60 | HR 58 | Temp 97.8°F | Resp 16 | Ht 71.0 in | Wt 226.5 lb

## 2023-04-10 DIAGNOSIS — Z23 Encounter for immunization: Secondary | ICD-10-CM | POA: Diagnosis not present

## 2023-04-10 DIAGNOSIS — Z0000 Encounter for general adult medical examination without abnormal findings: Secondary | ICD-10-CM

## 2023-04-10 NOTE — Progress Notes (Unsigned)
Subjective:    Patient ID: Danny [name], male    DOB: Feb 05, 1961, 62 y.o.   MRN: [id number]  DOS:  04/10/2023 Type of visit - description: cpx  Here for CPX, doing great. Specifically denies chest pain, difficulty breathing or palpitations.   Review of Systems See above   Past Medical History:  Diagnosis Date   Allergic rhinitis    Benign prostatic hypertrophy    s/p TUNA procedure aprx 2006,   Carpal tunnel syndrome 06/17/2009   Diabetes mellitus    Type II dx 1999   DM type 2 with diabetic peripheral neuropathy (HCC) 10/15/2014   ED (erectile dysfunction)    After TUNA   Elevated LFTs 2010   (-) chronic hep  panel   History of cardiovascular stress test 8/11   Negative   Hyperlipidemia    HYPERSOMNIA UNSPECIFIED 12/11/2007   Hypertension    Obesity    PAF (paroxysmal atrial fibrillation) (HCC) 12/21/2009   a. Failed Multaq. b. Prev on flecainide. c. s/p afib ablation 2015.    Past Surgical History:  Procedure Laterality Date   ABLATION  03/24/2014   PVI by Dr Johney Frame   ATRIAL FIBRILLATION ABLATION N/A 03/24/2014   Procedure: ATRIAL FIBRILLATION ABLATION;  Surgeon: Gardiner Rhyme, MD;  Location: [facility] CATH LAB;  Service: Cardiovascular;  Laterality: N/A;   COLONOSCOPY  11/25/2012   Mild sigmoid diverticulosis, small internal hemorrhoids, otherwise normal colonoscopy   CYSTOSCOPY  04/26/2008   neg  @ urology, was rx Flomax   LEFT HEART CATH AND CORONARY ANGIOGRAPHY N/A 08/30/2021   Procedure: LEFT HEART CATH AND CORONARY ANGIOGRAPHY;  Surgeon: Lennette Bihari, MD;  Location: [facility] INVASIVE CV LAB;  Service: Cardiovascular;  Laterality: N/A;   PROSTATE SURGERY     TUNA   TEE WITHOUT CARDIOVERSION N/A 03/23/2014   Procedure: TRANSESOPHAGEAL ECHOCARDIOGRAM (TEE);  Surgeon: Pricilla Riffle, MD;  Location: [facility] ENDOSCOPY;  Service: Cardiovascular;  Laterality: N/A;   VASECTOMY     several years ago    Current Outpatient Medications  Medication Instructions   acetaminophen  (TYLENOL) 650 mg, Oral, Every 8 hours PRN   azelastine (ASTELIN) 0.1 % nasal spray 2 sprays, Each Nare, 2 times daily, Use in each nostril as directed   BD VEO INSULIN SYRINGE U/F 31G X 15/64" 0.5 ML MISC USE 1 SYRINGE AS DIRECTED 3 TO 4 TIMES DAILY   Blood Glucose Monitoring Suppl (ONETOUCH VERIO REFLECT) w/Device KIT 1   Continuous Glucose Sensor (DEXCOM G7 SENSOR) MISC USE AS DIRECTED. CHANGE EVERY 10 DAYS   Dapagliflozin Pro-metFORMIN ER (XIGDUO XR) 10-998 MG TB24 1 tablet, Oral, 2 times daily   dicyclomine (BENTYL) 10 MG capsule TAKE 1 CAPSULE(10 MG) BY MOUTH FOUR TIMES DAILY BEFORE MEALS AND AT BEDTIME   ELIQUIS 5 MG TABS tablet TAKE 1 TABLET(5 MG) BY MOUTH TWICE DAILY   fluticasone (FLONASE) 50 MCG/ACT nasal spray SHAKE LIQUID AND USE 2 SPRAYS IN EACH NOSTRIL DAILY AS NEEDED FOR ALLERGIES OR RHINITIS   Glucagon (BAQSIMI ONE PACK) 3 MG/DOSE POWD To be sprayed in nostril as needed for severe hypoglycemia   glucose blood (ONETOUCH VERIO) test strip USE AS DIRECTED TO CHECK BLOOD SUGAR 4 TIMES DAILY   irbesartan (AVAPRO) 150 mg, Oral, Daily   Lancets (ONETOUCH DELICA PLUS LANCET33G) MISC Use to check blood sugar 4 times per day   loratadine (CLARITIN) 10 mg, Oral, Daily   Multiple Vitamins-Minerals (MULTIVITAMIN WITH MINERALS) tablet 1 tablet, Oral, Daily   nitroGLYCERIN (NITROSTAT)  0.4 MG SL tablet DISSOLVE ONE TABLET UNDER THE TONGUE EVERY 5 MINUTES AS NEEDED FOR CHEST PAIN.  DO NOT EXCEED A TOTAL OF 3 DOSES IN 15 MINUTES   NOVOLOG FLEXPEN 100 UNIT/ML FlexPen INJECT 10 TO 12 UNITS SUBCUTANEOUSLY THREE TIMES DAILY BEFORE MEAL(S)   omega-3 acid ethyl esters (LOVAZA) 1 g capsule Oral   pantoprazole (PROTONIX) 40 mg, Oral, Daily, Take 30 minutes prior to eating.   Polyethyl Glyc-Propyl Glyc PF 0.4-0.3 % SOLN 2 drops, Ophthalmic, 3 times daily PRN   pregabalin (LYRICA) 100 mg, Oral, 3 times daily   Probiotic Product (PROBIOTIC DAILY PO) 1 capsule, Oral, Daily   rosuvastatin (CRESTOR) 40 mg,  Oral, Daily   Semaglutide, 1 MG/DOSE, (OZEMPIC, 1 MG/DOSE,) 4 MG/3ML SOPN INJECT 1 DOSE SUBCUTANEOUSLY ONCE A WEEK   silodosin (RAPAFLO) 8 mg, Oral, Daily with breakfast   TRESIBA FLEXTOUCH 100 UNIT/ML FlexTouch Pen INJECT 42 UNITS SUBCUTANEOUSLY ONCE DAILY       Objective:   Physical Exam BP 116/60   Pulse (!) 58   Temp 97.8 F (36.6 C) (Oral)   Resp 16   Ht 5\' 11"  (1.803 m)   Wt 226 lb 8 oz (102.7 kg)   SpO2 95%   BMI 31.59 kg/m  General: Well developed, NAD, BMI noted Neck: No  thyromegaly  HEENT:  Normocephalic . Face symmetric, atraumatic Lungs:  CTA B Normal respiratory effort, no intercostal retractions, no accessory muscle use. Heart: RRR,  no murmur.  Abdomen:  Not distended, soft, non-tender. No rebound or rigidity.   Lower extremities: no pretibial edema bilaterally  Skin: Exposed areas without rash. Not pale. Not jaundice Neurologic:  alert & oriented X3.  Speech normal, gait appropriate for age and unassisted Strength symmetric and appropriate for age.  Psych: Cognition and judgment appear intact.  Cooperative with normal attention span and concentration.  Behavior appropriate. No anxious or depressed appearing.     Assessment     Assessment DM , Dr Lucianne Muss Neuropathy due to DM: NCV 2016: + Peripheral neuropathy HTN Hyperlipidemia CV: - Atrial fibrillation, 2011, s/p ablation 02-2014, now sinus rhythm, anticoagulated -High calcium coronary score, cardiac catheterization: Nonobstructive disease BPH s/p TUNA ED S/P shingles, postherpetic neuralgia R foot TIA (?) 10-2016, saw neuro, Rx carotid US: L ICA 1-39% otherwise (-).  Carotid US negative (01-2020)  PLAN Here for CPX -Tdap: 2022 - pnm shot 2015;  prevnar 07-2015 - s/p Shingrix x2 - flu shot: Today - rec: RSV, covid booster  -CCS:  Cscope Dr Chales Abrahams in [place] : 2009 hyperplastic polyps.  Colonoscopy 11-2012 ,  Colonoscopy 08/07/2017, C-scope 12-2022 next per GI -Prostate cancer screening, PSA  0.2 05/2022 -Labs:reviewed, all ok  -Diet-exercise - discussed   Paroxysmal A-fib, CAD, syncope: Saw cardiology 01-2023, felt to be stable DM: Last visit with Endo 01-2023.  Last A1c satisfactory High cholesterol: Last FLP excellent.  Continue present care. GI: Saw GI 09-2022, multiple issues addressed including LLQ abdominal pain, constipation, GERD.  Denies symptoms today. RTC 1 year

## 2023-04-10 NOTE — Patient Instructions (Addendum)
Vaccines I recommend: Covid booster RSV vaccine    Check the  blood pressure regularly Blood pressure goal:  between 110/65 and  135/85. If it is consistently higher or lower, let me know   Next visit with me in 1 year for a physical     Please schedule it at the front desk      "Health Care Power of attorney" ,  "Living will" (Advance care planning documents)  If you already have a living will or healthcare power of attorney, is recommended you bring the copy to be scanned in your chart.   The document will be available to all the doctors you see in the system.  Advance care planning is a process that supports adults in  understanding and sharing their preferences regarding future medical care.  The patient's preferences are recorded in documents called Advance Directives and the can be modified at any time while the patient is in full mental capacity.   If you don't have one, please consider create one.      More information at: [email or web address]

## 2023-04-11 ENCOUNTER — Encounter: Payer: Self-pay | Admitting: Internal Medicine

## 2023-04-11 NOTE — Assessment & Plan Note (Signed)
Here for CPX -Tdap: 2022 - pnm shot 2015;  prevnar 07-2015 - s/p Shingrix x2 - flu shot: Today - rec: RSV, covid booster  -CCS:  Cscope Dr Chales Abrahams in [place] : 2009 hyperplastic polyps.  Colonoscopy 11-2012 ,  Colonoscopy 08/07/2017, C-scope 12-2022 next per GI -Prostate cancer screening, PSA 0.2  05/2022.  Sees urology -Labs:reviewed, all ok  -Diet-exercise - discussed

## 2023-04-11 NOTE — Assessment & Plan Note (Signed)
Here for CPX Paroxysmal A-fib, CAD, syncope: Saw cardiology 01-2023, felt to be stable DM: Last visit with Endo 01-2023.  Last A1c satisfactory High cholesterol: Last FLP excellent.  Continue present care. GI: Saw GI 09-2022, multiple issues addressed including LLQ abdominal pain, constipation, GERD.  Denies symptoms today. RTC 1 year

## 2023-04-12 ENCOUNTER — Encounter: Payer: Self-pay | Admitting: Endocrinology

## 2023-04-12 ENCOUNTER — Ambulatory Visit: Payer: BC Managed Care – PPO | Admitting: Endocrinology

## 2023-04-12 ENCOUNTER — Other Ambulatory Visit: Payer: Self-pay | Admitting: Endocrinology

## 2023-04-12 VITALS — BP 118/64 | HR 73 | Resp 20 | Ht 71.0 in | Wt 224.6 lb

## 2023-04-12 DIAGNOSIS — E118 Type 2 diabetes mellitus with unspecified complications: Secondary | ICD-10-CM | POA: Diagnosis not present

## 2023-04-12 DIAGNOSIS — Z794 Long term (current) use of insulin: Secondary | ICD-10-CM | POA: Diagnosis not present

## 2023-04-12 DIAGNOSIS — E1140 Type 2 diabetes mellitus with diabetic neuropathy, unspecified: Secondary | ICD-10-CM

## 2023-04-12 DIAGNOSIS — E1165 Type 2 diabetes mellitus with hyperglycemia: Secondary | ICD-10-CM

## 2023-04-12 MED ORDER — TRESIBA FLEXTOUCH 100 UNIT/ML ~~LOC~~ SOPN: PEN_INJECTOR | SUBCUTANEOUS | 1 refills | Status: DC

## 2023-04-12 MED ORDER — NOVOLOG FLEXPEN 100 UNIT/ML ~~LOC~~ SOPN: PEN_INJECTOR | SUBCUTANEOUS | 1 refills | Status: DC

## 2023-04-12 MED ORDER — SEMAGLUTIDE (2 MG/DOSE) 8 MG/3ML ~~LOC~~ SOPN: 2.0000 mg | PEN_INJECTOR | SUBCUTANEOUS | 3 refills | Status: DC

## 2023-04-12 NOTE — Patient Instructions (Signed)
Decrease Treiba to 46 units daily.  Increase Ozempic to 2mg  weekly.  Xigduo 10/998 1 tab twice a day.  Novolog 10-15 units 2-3 times a day, with meals.

## 2023-04-12 NOTE — Progress Notes (Addendum)
Outpatient Endocrinology Note [place] Mekaela Azizi, MD  04/14/23  Patient's Name: Danny [name]    DOB: [birth year]    MRN: [id number]                                                    REASON OF VISIT: Follow up for type 2 diabetes mellitus  PCP: Wanda Plump, MD  HISTORY OF PRESENT ILLNESS:   Danny [name] is a 62 y.o. old male with past medical history listed below, is here for follow up of type 2 diabetes mellitus.   Pertinent Diabetes History: _Diagnosed as Diabetes Mellitus type 2  in 1992. Insulin therapy was started in 2011.   Chronic Diabetes Complications : Retinopathy: no. Last ophthalmology exam was done on annually.  Nephropathy: no, on irbesartan Peripheral neuropathy: yes, numbness, tingling, followed by neurology on lyrica. Coronary artery disease: no Stroke: no  Relevant comorbidities and cardiovascular risk factors: Obesity: yes Body mass index is 31.33 kg/m.  Hypertension: yes Hyperlipidemia. Yes, on statin.   Current / Home Diabetic regimen includes: Treiba 50 units daily.  Ozempic  1 mg weekly.  Xigduo XR 10/998 1 tab twice a day.  Novolog 15-20 units 2-3 times a day, with meals.  Sometimes he does not take NovoLog if blood sugar is low or or if he is physically active.  Prior diabetic medications: Metformin, stopped due to fear of liver problems. Januvia, Amaryl, Actos.   Glycemic data:    CONTINUOUS GLUCOSE MONITORING SYSTEM (CGMS) INTERPRETATION: At today's visit, we reviewed CGM downloads. The full report is scanned in the media. Reviewing the CGM trends, blood glucose are as follows:  Dexcom G7 CGM-  Sensor Download (Sensor download was reviewed and summarized below.) Dates: October 4 to April 12, 2023 for 14 days.  Glucose Management Indicator: 6.8% Sensor Average: 144 SD 26.7  Glycemic Trends:  <54: <1% 54-70: 1% 71-180: 82% 181-250: 16% 251-400: 1%  Interpretation: -Mostly acceptable blood sugar with occasional mild hyperglycemia  with blood sugar up to 200, postprandially with meals.  No significant hypoglycemia.  Acceptable blood sugar overnight.  Hypoglycemia: Patient has minor hypoglycemic episodes. Patient has hypoglycemia awareness.  Factors modifying glucose control: 1.  Diabetic diet assessment: 3 meals a day.   2.  Staying active or exercising:   3.  Medication compliance: compliant all of the time.  Interval history 04/12/2023 CGM data and diabetes regimen as reviewed above. HbA1C 6.8%. no complaints.   REVIEW OF SYSTEMS As per history of present illness.   PAST MEDICAL HISTORY: Past Medical History:  Diagnosis Date   Allergic rhinitis    Benign prostatic hypertrophy    s/p TUNA procedure aprx 2006,   Carpal tunnel syndrome 06/17/2009   Diabetes mellitus    Type II dx 1999   DM type 2 with diabetic peripheral neuropathy (HCC) 10/15/2014   ED (erectile dysfunction)    After TUNA   Elevated LFTs 2010   (-) chronic hep  panel   History of cardiovascular stress test 8/11   Negative   Hyperlipidemia    HYPERSOMNIA UNSPECIFIED 12/11/2007   Hypertension    Obesity    PAF (paroxysmal atrial fibrillation) (HCC) 12/21/2009   a. Failed Multaq. b. Prev on flecainide. c. s/p afib ablation 2015.    PAST SURGICAL HISTORY: Past Surgical History:  Procedure Laterality Date  ABLATION  03/24/2014   PVI by Dr Johney Frame   ATRIAL FIBRILLATION ABLATION N/A 03/24/2014   Procedure: ATRIAL FIBRILLATION ABLATION;  Surgeon: Gardiner Rhyme, MD;  Location: [facility] CATH LAB;  Service: Cardiovascular;  Laterality: N/A;   COLONOSCOPY  11/25/2012   Mild sigmoid diverticulosis, small internal hemorrhoids, otherwise normal colonoscopy   CYSTOSCOPY  04/26/2008   neg  @ urology, was rx Flomax   LEFT HEART CATH AND CORONARY ANGIOGRAPHY N/A 08/30/2021   Procedure: LEFT HEART CATH AND CORONARY ANGIOGRAPHY;  Surgeon: Lennette Bihari, MD;  Location: [facility] INVASIVE CV LAB;  Service: Cardiovascular;  Laterality: N/A;   PROSTATE SURGERY      TUNA   TEE WITHOUT CARDIOVERSION N/A 03/23/2014   Procedure: TRANSESOPHAGEAL ECHOCARDIOGRAM (TEE);  Surgeon: Pricilla Riffle, MD;  Location: [facility] ENDOSCOPY;  Service: Cardiovascular;  Laterality: N/A;   VASECTOMY     several years ago    ALLERGIES: No Known Allergies  FAMILY HISTORY:  Family History  Problem Relation Age of Onset   Heart disease Mother    Diabetes Mother    Hypertension Mother    Congestive Heart Failure Mother    Heart attack Mother    Hypertension Father    Colon cancer Neg Hx    Prostate cancer Neg Hx    Stomach cancer Neg Hx    Rectal cancer Neg Hx     SOCIAL HISTORY: Social History   Socioeconomic History   Marital status: Married    Spouse name: Not on file   Number of children: 3   Years of education: GED   Highest education level: Not on file  Occupational History   Occupation: Works for [employer] (state).  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[employer]    Comment: works for [employer] (state)  Tobacco Use   Smoking status: Former    Current packs/day: 0.00    Types: Cigarettes    Quit date: 06/26/1985    Years since quitting: 37.8   Smokeless tobacco: Never   Tobacco comments:    Former smoker, quit 1987  Vaping Use   Vaping status: Never Used  Substance and Sexual Activity   Alcohol use: No    Comment:     Drug use: No   Sexual activity: Not on file  Other Topics Concern   Not on file  Social History Narrative   Lives with wife in a one story   Right handed    One story home   3 children         Social Determinants of Health   Financial Resource Strain: Not on file  Food Insecurity: Low Risk  (01/08/2023)   Received from [facility]   Hunger Vital Sign    Worried About Running Out of Food in the Last Year: Never true    Ran Out of Food in the Last Year: Never true  Transportation Needs: Not on file (01/08/2023)  Physical Activity: Not on file  Stress: Not on file  Social Connections: Not on file    MEDICATIONS:  Current  Outpatient Medications  Medication Sig Dispense Refill   acetaminophen (TYLENOL) 650 MG CR tablet Take 650 mg by mouth every 8 (eight) hours as needed for pain.     azelastine (ASTELIN) 0.1 % nasal spray Place 2 sprays into both nostrils 2 (two) times daily. Use in each nostril as directed 30 mL 5   BD VEO INSULIN SYRINGE U/F 31G X 15/64" 0.5 ML MISC USE 1 SYRINGE  AS DIRECTED 3 TO 4 TIMES DAILY 100 each 0   Blood Glucose Monitoring Suppl (ONETOUCH VERIO REFLECT) w/Device KIT 1 1 kit 0   Continuous Glucose Sensor (DEXCOM G7 SENSOR) MISC USE AS DIRECTED. CHANGE EVERY 10 DAYS 3 each 3   Dapagliflozin Pro-metFORMIN ER (XIGDUO XR) 10-998 MG TB24 Take 1 tablet by mouth twice daily 180 tablet 1   dicyclomine (BENTYL) 10 MG capsule TAKE 1 CAPSULE(10 MG) BY MOUTH FOUR TIMES DAILY BEFORE MEALS AND AT BEDTIME 120 capsule 11   ELIQUIS 5 MG TABS tablet TAKE 1 TABLET(5 MG) BY MOUTH TWICE DAILY 60 tablet 5   fluticasone (FLONASE) 50 MCG/ACT nasal spray SHAKE LIQUID AND USE 2 SPRAYS IN EACH NOSTRIL DAILY AS NEEDED FOR ALLERGIES OR RHINITIS 16 g 5   Glucagon (BAQSIMI ONE PACK) 3 MG/DOSE POWD To be sprayed in nostril as needed for severe hypoglycemia 1 each 1   glucose blood (ONETOUCH VERIO) test strip USE AS DIRECTED TO CHECK BLOOD SUGAR 4 TIMES DAILY 125 each 2   irbesartan (AVAPRO) 150 MG tablet Take 1 tablet by mouth once daily 90 tablet 2   Lancets (ONETOUCH DELICA PLUS LANCET33G) MISC Use to check blood sugar 4 times per day 200 each 2   loratadine (CLARITIN) 10 MG tablet Take 10 mg by mouth daily.     Multiple Vitamins-Minerals (MULTIVITAMIN WITH MINERALS) tablet Take 1 tablet by mouth daily.     nitroGLYCERIN (NITROSTAT) 0.4 MG SL tablet DISSOLVE ONE TABLET UNDER THE TONGUE EVERY 5 MINUTES AS NEEDED FOR CHEST PAIN.  DO NOT EXCEED A TOTAL OF 3 DOSES IN 15 MINUTES 25 tablet 0   omega-3 acid ethyl esters (LOVAZA) 1 g capsule Take by mouth.     pantoprazole (PROTONIX) 40 MG tablet Take 1 tablet (40 mg total) by  mouth daily. Take 30 minutes prior to eating. 90 tablet 3   Polyethyl Glyc-Propyl Glyc PF 0.4-0.3 % SOLN Apply 2 drops to eye 3 (three) times daily as needed (for dry eyes).     pregabalin (LYRICA) 100 MG capsule TAKE 1 CAPSULE BY MOUTH THREE TIMES DAILY 90 capsule 0   Probiotic Product (PROBIOTIC DAILY PO) Take 1 capsule by mouth daily.     rosuvastatin (CRESTOR) 40 MG tablet Take 1 tablet by mouth once daily 90 tablet 3   Semaglutide, 2 MG/DOSE, 8 MG/3ML SOPN Inject 2 mg as directed once a week. 9 mL 3   silodosin (RAPAFLO) 8 MG CAPS capsule Take 8 mg by mouth daily with breakfast.     NOVOLOG FLEXPEN 100 UNIT/ML FlexPen INJECT 10 TO 15 UNITS SUBCUTANEOUSLY THREE TIMES DAILY BEFORE MEAL(S) 30 mL 1   TRESIBA FLEXTOUCH 100 UNIT/ML FlexTouch Pen INJECT 4=46 UNITS INTO THE SKIN ONCE DAILY 15 mL 1   No current facility-administered medications for this visit.    PHYSICAL EXAM: Vitals:   04/12/23 1502  BP: 118/64  Pulse: 73  Resp: 20  SpO2: 96%  Weight: 224 lb 9.6 oz (101.9 kg)  Height: 5\' 11"  (1.803 m)   Body mass index is 31.33 kg/m.  Wt Readings from Last 3 Encounters:  04/12/23 224 lb 9.6 oz (101.9 kg)  04/10/23 226 lb 8 oz (102.7 kg)  02/22/23 221 lb (100.2 kg)    General: Well developed, well nourished male in no apparent distress.  HEENT: AT/[place], no external lesions.  Eyes: Conjunctiva clear and no icterus. Neck: Neck supple  Lungs: Respirations not labored Neurologic: Alert, oriented, normal speech Extremities / Skin: Dry.  Psychiatric: Does not appear depressed or anxious  Diabetic Foot Exam - Simple   No data filed    LABS Reviewed Lab Results  Component Value Date   HGBA1C 6.8 (H) 04/09/2023   HGBA1C 7.1 (H) 02/05/2023   HGBA1C 6.8 (H) 10/20/2022   No results found for: "FRUCTOSAMINE" Lab Results  Component Value Date   CHOL 67 (L) 02/22/2023   HDL 31 (L) 02/22/2023   LDLCALC 17 02/22/2023   LDLDIRECT 62.0 03/29/2021   TRIG 98 02/22/2023   CHOLHDL 2.2  02/22/2023   Lab Results  Component Value Date   MICRALBCREAT 0.8 04/12/2023   MICRALBCREAT 0.7 03/20/2022   Lab Results  Component Value Date   CREATININE 0.82 04/09/2023   Lab Results  Component Value Date   GFR 93.01 04/09/2023    ASSESSMENT / PLAN  1. Controlled type 2 diabetes mellitus with complication, with long-term current use of insulin (HCC)   2. Type 2 diabetes mellitus with diabetic neuropathy, with long-term current use of insulin (HCC)     Diabetes Mellitus type 2, complicated by diabetes neuropathy.  - Diabetic status / severity: controlled.   Lab Results  Component Value Date   HGBA1C 6.8 (H) 04/09/2023    - Hemoglobin A1c goal : <7%  - Medications: see below Decrease Treiba from 50 to 46 units daily.  Increase Ozempic to 2mg  weekly.  Xigduo XR 10/998 1 tab twice a day.  Novolog 10-15 units 2-3 times a day, with meals.   - Home glucose testing: CGM and check as needed.  - Discussed/ Gave Hypoglycemia treatment plan.  # Consult : not required at this time.   # Annual urine for microalbuminuria/ creatinine ratio, no microalbuminuria currently, continue ACE/ARB / irbesartan.  Last  Lab Results  Component Value Date   MICRALBCREAT 0.8 04/12/2023    # Foot check nightly / neuropathy, continue lyrica.   # Annual dilated diabetic eye exams.   - Diet: Eat reasonable portion sizes to promote a healthy weight - Life style / activity / exercise: discussed.   2. Blood pressure  -  BP Readings from Last 1 Encounters:  04/12/23 118/64    - Control is in target.  - No change in current plans.  3. Lipid status / Hyperlipidemia - Last  Lab Results  Component Value Date   LDLCALC 17 02/22/2023   - Continue rosuvastatin 40mg  daily. Managed by cardiology.    Diagnoses and all orders for this visit:  Controlled type 2 diabetes mellitus with complication, with long-term current use of insulin (HCC) -     Microalbumin / creatinine urine ratio;  Future -     Cancel: Basic metabolic panel; Future -     Cancel: Hemoglobin A1c; Future -     Cancel: Hemoglobin A1c -     Cancel: Basic metabolic panel -     Microalbumin / creatinine urine ratio  Type 2 diabetes mellitus with diabetic neuropathy, with long-term current use of insulin (HCC)  Other orders -     Semaglutide, 2 MG/DOSE, 8 MG/3ML SOPN; Inject 2 mg as directed once a week. -     Discontinue: TRESIBA FLEXTOUCH 100 UNIT/ML FlexTouch Pen; INJECT 4=46 UNITS SUBCUTANEOUSLY ONCE DAILY -     NOVOLOG FLEXPEN 100 UNIT/ML FlexPen; INJECT 10 TO 15 UNITS SUBCUTANEOUSLY THREE TIMES DAILY BEFORE MEAL(S)    DISPOSITION Follow up in clinic in 4 months suggested.   All questions answered and patient verbalized understanding of the plan.  [place]  Erroll Luna, MD [facility] Endocrinology [facility] [address] [place], [address] [place], [place] [address] Phone # [phone]  At least part of this note was generated using voice recognition software. Inadvertent word errors may have occurred, which were not recognized during the proofreading process.

## 2023-04-13 LAB — MICROALBUMIN / CREATININE URINE RATIO
Creatinine,U: 90.4 mg/dL
Microalb Creat Ratio: 0.8 mg/g (ref 0.0–30.0)
Microalb, Ur: <0.7 mg/dL (ref 0.0–1.9)

## 2023-04-16 ENCOUNTER — Other Ambulatory Visit: Payer: Self-pay | Admitting: Cardiology

## 2023-04-16 ENCOUNTER — Encounter: Payer: Self-pay | Admitting: Endocrinology

## 2023-04-16 ENCOUNTER — Other Ambulatory Visit: Payer: Self-pay | Admitting: Endocrinology

## 2023-04-16 ENCOUNTER — Other Ambulatory Visit: Payer: Self-pay | Admitting: Podiatry

## 2023-04-16 DIAGNOSIS — I480 Paroxysmal atrial fibrillation: Secondary | ICD-10-CM

## 2023-04-16 MED ORDER — TRESIBA FLEXTOUCH 100 UNIT/ML ~~LOC~~ SOPN: PEN_INJECTOR | SUBCUTANEOUS | 1 refills | Status: DC

## 2023-04-16 NOTE — Telephone Encounter (Signed)
Pt last saw Dr Jens Som 02/22/23, last labs 04/09/23 Creat 0.82, age 62, weight 101.9kg, based on specified criteria pt is on appropriate dosage of Eliquis 5mg  BID for afib.  Will refill rx.

## 2023-04-23 ENCOUNTER — Other Ambulatory Visit: Payer: Self-pay | Admitting: Internal Medicine

## 2023-04-23 ENCOUNTER — Ambulatory Visit (INDEPENDENT_AMBULATORY_CARE_PROVIDER_SITE_OTHER): Payer: BC Managed Care – PPO

## 2023-04-23 DIAGNOSIS — L84 Corns and callosities: Secondary | ICD-10-CM | POA: Diagnosis not present

## 2023-04-23 DIAGNOSIS — M7742 Metatarsalgia, left foot: Secondary | ICD-10-CM

## 2023-04-23 DIAGNOSIS — M792 Neuralgia and neuritis, unspecified: Secondary | ICD-10-CM

## 2023-04-23 DIAGNOSIS — M7741 Metatarsalgia, right foot: Secondary | ICD-10-CM | POA: Diagnosis not present

## 2023-04-23 DIAGNOSIS — E0840 Diabetes mellitus due to underlying condition with diabetic neuropathy, unspecified: Secondary | ICD-10-CM

## 2023-04-23 DIAGNOSIS — Z794 Long term (current) use of insulin: Secondary | ICD-10-CM

## 2023-04-23 NOTE — Progress Notes (Signed)

## 2023-04-28 ENCOUNTER — Other Ambulatory Visit: Payer: Self-pay | Admitting: Gastroenterology

## 2023-04-28 ENCOUNTER — Other Ambulatory Visit: Payer: Self-pay | Admitting: Cardiology

## 2023-05-01 ENCOUNTER — Other Ambulatory Visit (HOSPITAL_COMMUNITY): Payer: Self-pay

## 2023-05-01 ENCOUNTER — Telehealth: Payer: Self-pay

## 2023-05-01 ENCOUNTER — Encounter: Payer: Self-pay | Admitting: Internal Medicine

## 2023-05-01 ENCOUNTER — Other Ambulatory Visit: Payer: Self-pay

## 2023-05-01 DIAGNOSIS — E118 Type 2 diabetes mellitus with unspecified complications: Secondary | ICD-10-CM

## 2023-05-01 MED ORDER — DEXCOM G7 SENSOR MISC: 3 refills | Status: DC

## 2023-05-01 NOTE — Telephone Encounter (Signed)
Pharmacy Patient Advocate Encounter   Received notification from CoverMyMeds that prior authorization for Ozmepic is required/requested.   Insurance verification completed.   The patient is insured through [employer] [facility] .   Per test claim: PA required; PA submitted to above mentioned insurance via CoverMyMeds Key/confirmation #/EOC [id number] Status is pending

## 2023-05-02 ENCOUNTER — Telehealth: Payer: Self-pay

## 2023-05-02 ENCOUNTER — Other Ambulatory Visit (HOSPITAL_COMMUNITY): Payer: Self-pay

## 2023-05-02 NOTE — Telephone Encounter (Signed)
Pharmacy Patient Advocate Encounter  Received notification from [employer] [facility] that Prior Authorization for Dexcom G7 sensor has been APPROVED through 04/30/2024   PA #/Case ID/Reference #: [id number]

## 2023-05-02 NOTE — Telephone Encounter (Signed)
Pharmacy Patient Advocate Encounter   Received notification from CoverMyMeds that prior authorization for Dexcom G7 sensor is required/requested.   Insurance verification completed.   The patient is insured through [employer] .   Per test claim: PA required; PA submitted to above mentioned insurance via CoverMyMeds Key/confirmation #/EOC [facility] Status is pending

## 2023-05-23 ENCOUNTER — Other Ambulatory Visit: Payer: Self-pay | Admitting: Podiatry

## 2023-05-23 DIAGNOSIS — M792 Neuralgia and neuritis, unspecified: Secondary | ICD-10-CM

## 2023-05-25 ENCOUNTER — Encounter: Payer: Self-pay | Admitting: Podiatry

## 2023-05-29 ENCOUNTER — Ambulatory Visit (INDEPENDENT_AMBULATORY_CARE_PROVIDER_SITE_OTHER): Payer: BC Managed Care – PPO | Admitting: Podiatry

## 2023-05-29 ENCOUNTER — Encounter: Payer: Self-pay | Admitting: Podiatry

## 2023-05-29 DIAGNOSIS — L84 Corns and callosities: Secondary | ICD-10-CM

## 2023-05-29 DIAGNOSIS — B351 Tinea unguium: Secondary | ICD-10-CM

## 2023-05-29 DIAGNOSIS — M79674 Pain in right toe(s): Secondary | ICD-10-CM | POA: Diagnosis not present

## 2023-05-29 DIAGNOSIS — M79675 Pain in left toe(s): Secondary | ICD-10-CM

## 2023-05-29 DIAGNOSIS — Z794 Long term (current) use of insulin: Secondary | ICD-10-CM

## 2023-05-29 DIAGNOSIS — E0840 Diabetes mellitus due to underlying condition with diabetic neuropathy, unspecified: Secondary | ICD-10-CM | POA: Diagnosis not present

## 2023-05-29 DIAGNOSIS — M792 Neuralgia and neuritis, unspecified: Secondary | ICD-10-CM

## 2023-05-30 NOTE — Progress Notes (Addendum)
  Subjective:  Patient ID: Danny [name], male    DOB: 11/17/1960,  MRN: [id number]  Chief Complaint  Patient presents with   Foot Care    Last A1c: 6.3. Takes Eliquis for anticoag. Needs all nails trimmed. Has questions about alternative treatment for diabetic neuropathy because the Lyrica does not seem to be helping much.     62 y.o. male presents with the above complaint. History confirmed with patient. Patient presenting with pain related to dystrophic thickened elongated nails. Patient is unable to trim own nails related to nail dystrophy and/or mobility issues. Patient does  have a history of T2DM. Patient does have callus present located at the plantar 1st MPJ left foot as well as the fifth metatarsal head bilaterally that are painful to the patient despite home maintenance.  Patient does have neuropathic pain which is controlled with Lyrica currently.  Objective:  Physical Exam: warm, good capillary refill nail exam onychomycosis of the toenails, onycholysis, and dystrophic nails DP pulses palpable, PT pulses palpable, and protective sensation intact Left Foot:  Pain with palpation of nails due to elongation and dystrophic growth.  Hyperkeratotic lesion sub one and fifth metatarsal heads. Right Foot: Pain with palpation of nails due to elongation and dystrophic growth.  Right foot with hyperkeratotic lesion subfifth metatarsal head.  Assessment:   1. Diabetes mellitus due to underlying condition, controlled, with diabetic neuropathy, with long-term current use of insulin (HCC)   2. Neuropathic pain   3. Pre-ulcerative calluses   4. Pain due to onychomycosis of toenails of both feet         Plan:  Patient was evaluated and treated and all questions answered.  # Peripheral neuropathy and neuropathic pain -Pt states improved on higher dosage  -Continue on lyrica 100 mg TID    # Preulcerative calluses with DM2 with mild neuropathy -Preulcerative calluses x 3 were sharply  debrided using 312 scalpel blade without incident. - Discussed home care for this, use white vinegar scrub and may gently use pumice stone to manage the calluses. -Patient tolerating diabetic shoes and inserts well. -Counseled patient on diabetic foot education   #Onychomycosis with pain  -Nails palliatively debrided as below. -Educated on self-care  Procedure: Nail Debridement Rationale: Pain Type of Debridement: manual, sharp debridement. Instrumentation: Nail nipper, rotary burr. Number of Nails: 10  Return in about 3 months (around 08/27/2023) for Diabetic Foot Care.         Bronwen Betters, DPM [facility] / [facility]

## 2023-05-31 ENCOUNTER — Other Ambulatory Visit: Payer: Self-pay | Admitting: Podiatry

## 2023-05-31 MED ORDER — PREGABALIN 100 MG PO CAPS: 100.0000 mg | ORAL_CAPSULE | Freq: Three times a day (TID) | ORAL | 3 refills | Status: DC

## 2023-06-03 ENCOUNTER — Encounter: Payer: Self-pay | Admitting: Endocrinology

## 2023-06-05 NOTE — Telephone Encounter (Signed)
These symptoms could be related with increased dose of Ozempic, which is expected and generally should get better after couple of doses.  If that was the first injection I would recommend to try another 1 or 2 doses if you continue to have the symptoms we need to decrease Ozempic to 1 mg weekly.

## 2023-06-16 ENCOUNTER — Other Ambulatory Visit: Payer: Self-pay | Admitting: Endocrinology

## 2023-06-16 DIAGNOSIS — E118 Type 2 diabetes mellitus with unspecified complications: Secondary | ICD-10-CM

## 2023-08-02 ENCOUNTER — Other Ambulatory Visit: Payer: Self-pay

## 2023-08-06 ENCOUNTER — Other Ambulatory Visit: Payer: 59

## 2023-08-06 ENCOUNTER — Telehealth: Payer: Self-pay | Admitting: Cardiology

## 2023-08-06 ENCOUNTER — Encounter: Payer: Self-pay | Admitting: Endocrinology

## 2023-08-06 ENCOUNTER — Telehealth: Payer: Self-pay

## 2023-08-06 DIAGNOSIS — Z794 Long term (current) use of insulin: Secondary | ICD-10-CM

## 2023-08-06 LAB — BASIC METABOLIC PANEL
BUN: 15 mg/dL (ref 7–25)
CO2: 31 mmol/L (ref 20–32)
Calcium: 9.9 mg/dL (ref 8.6–10.3)
Chloride: 102 mmol/L (ref 98–110)
Creat: 0.86 mg/dL (ref 0.70–1.35)
Glucose, Bld: 140 mg/dL — ABNORMAL HIGH (ref 65–99)
Potassium: 5 mmol/L (ref 3.5–5.3)
Sodium: 140 mmol/L (ref 135–146)

## 2023-08-06 LAB — HEMOGLOBIN A1C
Hgb A1c MFr Bld: 7.3 %{Hb} — ABNORMAL HIGH (ref <5.7)
Mean Plasma Glucose: 163 mg/dL
eAG (mmol/L): 9 mmol/L

## 2023-08-06 NOTE — Telephone Encounter (Signed)
 Orders Placed This Encounter  Procedures   Hemoglobin A1c   Basic metabolic panel

## 2023-08-06 NOTE — Telephone Encounter (Signed)
 Called patient to discuss symptoms. States just in the last 2 weeks has been noticing increased swelling in bil feet and ankles.  States compliant with medications and not eating a salty diet. Denies chest pain or any shortness of breath. Denies any pain, warmth or redness in bil lower extremities. During conversation realized that his wife had already made an appointment for him with an APP. He states I had forgot about that and I can just wait until I see that provider. Instructed to call back if anything gets worse before appointment.

## 2023-08-06 NOTE — Telephone Encounter (Signed)
 Pt c/o swelling: STAT is pt has developed SOB within 24 hours  How much weight have you gained and in what time span? Last week or two  If swelling, where is the swelling located? Legs, ankles, and feet  Are you currently taking a fluid pill? No  Are you currently SOB? No  Do you have a log of your daily weights (if so, list)? Unsure  Have you gained 3 pounds in a day or 5 pounds in a week? Unsure  Have you traveled recently? No  Pt's wife stated we can call the patient back at [phone] for more information. Please advise.

## 2023-08-07 ENCOUNTER — Other Ambulatory Visit (HOSPITAL_BASED_OUTPATIENT_CLINIC_OR_DEPARTMENT_OTHER): Payer: Self-pay

## 2023-08-07 ENCOUNTER — Encounter (HOSPITAL_BASED_OUTPATIENT_CLINIC_OR_DEPARTMENT_OTHER): Payer: Self-pay

## 2023-08-07 ENCOUNTER — Encounter: Payer: Self-pay | Admitting: Endocrinology

## 2023-08-07 ENCOUNTER — Ambulatory Visit (HOSPITAL_BASED_OUTPATIENT_CLINIC_OR_DEPARTMENT_OTHER)
Admission: EM | Admit: 2023-08-07 | Discharge: 2023-08-07 | Disposition: A | Discharge status: Home / Self Care | Payer: 59 | Attending: Family Medicine | Admitting: Family Medicine

## 2023-08-07 DIAGNOSIS — Z794 Long term (current) use of insulin: Secondary | ICD-10-CM

## 2023-08-07 DIAGNOSIS — J101 Influenza due to other identified influenza virus with other respiratory manifestations: Secondary | ICD-10-CM | POA: Diagnosis not present

## 2023-08-07 DIAGNOSIS — E118 Type 2 diabetes mellitus with unspecified complications: Secondary | ICD-10-CM

## 2023-08-07 DIAGNOSIS — I480 Paroxysmal atrial fibrillation: Secondary | ICD-10-CM

## 2023-08-07 LAB — POC COVID19/FLU A&B COMBO
Covid Antigen, POC: NEGATIVE
Influenza A Antigen, POC: POSITIVE — AB
Influenza B Antigen, POC: NEGATIVE

## 2023-08-07 MED ORDER — OSELTAMIVIR PHOSPHATE 75 MG PO CAPS
75.0000 mg | ORAL_CAPSULE | Freq: Two times a day (BID) | ORAL | 0 refills | Status: DC
  Filled 2023-08-07: qty 10, 5d supply, fill #0

## 2023-08-07 MED ORDER — ACETAMINOPHEN 325 MG PO TABS
975.0000 mg | ORAL_TABLET | Freq: Once | ORAL | Status: AC
  Administered 2023-08-07: 975 mg via ORAL

## 2023-08-07 MED ORDER — BENZONATATE 100 MG PO CAPS
200.0000 mg | ORAL_CAPSULE | Freq: Three times a day (TID) | ORAL | 0 refills | Status: DC | PRN
  Filled 2023-08-07: qty 40, 7d supply, fill #0

## 2023-08-07 NOTE — ED Triage Notes (Signed)
Chills, cough, nasal congestion onset yesterday. Seen at a different urgent care Friday following symptoms of vertigo. Tested for flu, covid, and strep. All negative. Patient states did not have these symptoms then.

## 2023-08-07 NOTE — Discharge Instructions (Signed)
Influenza test is positive.  You are considered contagious to others as long as you have a measurable fever with a temperature 100 F.  You should consider yourself infectious until you are fever free for 24 hours without fever lowering medications. Continue to alternate Tylenol and ibuprofen for management of fever.   Force fluids to maintain hydration. Tamiflu twice daily for the next 5 days to reduce symptoms and course of influenza virus.   If you develop any shortness of breath, wheezing or difficulty breathing go immediately to the nearest emergency department.

## 2023-08-07 NOTE — ED Provider Notes (Signed)
[name]    CSN: [id number] Arrival date & time: 08/07/23  1343      History   Chief Complaint Chief Complaint  Patient presents with   Cough   Headache   Chills    HPI Danny [name] is a 63 y.o. male.  Patient today with acute onset cough, chills, headache, Nasal congestion times yesterday.  Patient was seen at a different urgent care last week after symptoms of vertigo and was tested for COVID and flu along with strep all of which were negative last week.  Patient on arrival was febrile with a temp of 101.4.  Patient has type 2 diabetes, paroxymal  atrial fibs with chronic coagulation, and a history of a GI bleed. Patient has taken OTC cough medication with honey.  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 Failed Multaq. b. Prev on flecainide. c. s/p afib ablation 2015.    Patient Active Problem List   Diagnosis Date Noted   Elevated coronary artery calcium score    Secondary hypercoagulable state (HCC) 11/16/2020   Abdominal pain, vomiting, and diarrhea    GI bleed 10/31/2016   Gross hematuria 07/26/2016   Obesity 01/21/2016   PCP NOTES >>>>>>>>>>>>>>>>>>>>>>> 08/12/2015   Bilateral foot pain 09/10/2014   Annual physical exam 01/13/2011   Hyperlipidemia    Hypertension    Paroxysmal atrial fibrillation (HCC) 12/21/2009   DYSLIPIDEMIA 12/14/2009   CARPAL TUNNEL SYNDROME 06/17/2009   Controlled type 2 diabetes mellitus with complication, with  long-term current use of insulin (HCC) 03/12/2007   Allergic rhinitis 03/12/2007   BENIGN PROSTATIC HYPERTROPHY 03/12/2007   ERECTILE DYSFUNCTION, 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    Past Surgical History:  Procedure Laterality Date   ABLATION  03/24/2014   PVI by Dr Johney Frame   ATRIAL FIBRILLATION ABLATION N/A 03/24/2014   Procedure: ATRIAL FIBRILLATION ABLATION;  Surgeon: Gardiner Rhyme, MD;  Location: [facility]ATH [facility];  Service: Cardiovascular;  Laterality: N/A;   COLONOSCOPY  11/25/2012   Mild sigmoid diverticulosis, small internal hemorrhoids, otherwise normal colonoscopy   CYSTOSCOPY  04/26/2008   neg  @ urology, was rx Flomax   LEFT HEART CATH AND CORONARY ANGIOGRAPHY N/A 08/30/2021   Procedure: LEFT HEART CATH AND CORONARY ANGIOGRAPHY;  Surgeon: Lennette Bihari, MD;  Location: [facility];  Service: Cardiovascular;  Laterality: N/A;   PROSTATE SURGERY     TUNA   TEE WITHOUT CARDIOVERSION N/A 03/23/2014   Procedure: TRANSESOPHAGEAL ECHOCARDIOGRAM (TEE);  Surgeon: Pricilla Riffle, MD;  Location: [facility] ENDOSCOPY;  Service: Cardiovascular;  Laterality: N/A;   VASECTOMY     several years ago       Home Medications    Prior to Admission medications   Medication Sig Start Date End Date Taking? Authorizing Provider  benzonatate (TESSALON) 100 MG capsule Take 2 capsules (200 mg total) by mouth 3 (three) times daily as needed  for cough. 08/07/23  Yes Bing Neighbors, NP  oseltamivir (TAMIFLU) 75 MG capsule Take 1 capsule (75 mg total) by mouth 2 (two) times daily. 08/07/23  Yes Bing Neighbors, NP  acetaminophen (TYLENOL) 650 MG CR tablet Take 650 mg by mouth every 8 (eight) hours as needed for pain.    [provider]  azelastine (ASTELIN) 0.1 % nasal spray Place 2 sprays into both nostrils 2 (two) times daily. Use in each nostril as directed 12/14/20   Wanda Plump, MD  BD VEO INSULIN SYRINGE U/F 31G X 15/64" 0.5 ML MISC USE 1 SYRINGE AS DIRECTED 3 TO 4 TIMES DAILY 07/10/19   Reather Littler, MD   Blood Glucose Monitoring Suppl (ONETOUCH VERIO REFLECT) w/Device KIT 1 10/04/21   Reather Littler, MD  Continuous Glucose Sensor (DEXCOM G7 SENSOR) MISC Change every 10 days 05/01/23   Thapa, [place], MD  Dapagliflozin Pro-metFORMIN ER (XIGDUO XR) 10-998 MG TB24 Take 1 tablet by mouth twice daily 01/22/23   Reather Littler, MD  dicyclomine (BENTYL) 10 MG capsule TAKE 1 CAPSULE(10 MG) BY MOUTH FOUR TIMES DAILY BEFORE MEALS AND AT BEDTIME 07/03/22   Lynann Bologna, MD  ELIQUIS 5 MG TABS tablet TAKE 1 TABLET(5 MG) BY MOUTH TWICE DAILY 04/16/23   Lewayne Bunting, MD  fluticasone (FLONASE) 50 MCG/ACT nasal spray Place 2 sprays into both nostrils daily as needed for allergies or rhinitis. 04/24/23   Wanda Plump, MD  Glucagon (BAQSIMI ONE PACK) 3 MG/DOSE POWD To be sprayed in nostril as needed for severe hypoglycemia 10/10/21   Reather Littler, MD  glucose blood (ONETOUCH VERIO) test strip USE AS DIRECTED TO CHECK BLOOD SUGAR 4 TIMES DAILY 05/15/22   Reather Littler, MD  irbesartan (AVAPRO) 150 MG tablet Take 1 tablet by mouth once daily 04/30/23   Lewayne Bunting, MD  Lancets [facility] DELICA PLUS [place]) MISC Use to check blood sugar 4 times per day 10/04/21   Reather Littler, MD  loratadine (CLARITIN) 10 MG tablet Take 10 mg by mouth daily.    [provider]  Multiple Vitamins-Minerals (MULTIVITAMIN WITH MINERALS) tablet Take 1 tablet by mouth daily.    [provider]  nitroGLYCERIN (NITROSTAT) 0.4 MG SL tablet DISSOLVE ONE TABLET UNDER THE TONGUE EVERY 5 MINUTES AS NEEDED FOR CHEST PAIN.  DO NOT EXCEED A TOTAL OF 3 DOSES IN 15 MINUTES 09/11/22   Lewayne Bunting, MD  NOVOLOG FLEXPEN 100 UNIT/ML FlexPen INJECT 10 TO 15 UNITS SUBCUTANEOUSLY THREE TIMES DAILY BEFORE MEAL(S) 04/12/23   Thapa, [place], MD  omega-3 acid ethyl esters (LOVAZA) 1 g capsule Take by mouth.    [provider]  pantoprazole (PROTONIX) 40 MG tablet TAKE 1 TABLET BY MOUTH ONCE DAILY 30  MINUTES  PRIOR  TO  EATING 04/30/23   Lynann Bologna, MD  Polyethyl Glyc-Propyl Glyc PF 0.4-0.3 % SOLN Apply 2 drops to eye 3 (three) times daily as needed (for dry eyes).    [provider]  pregabalin (LYRICA) 100 MG capsule Take 1 capsule (100 mg total) by mouth 3 (three) times daily. 05/31/23   Standiford, Jenelle Mages, DPM  Probiotic Product (PROBIOTIC DAILY PO) Take 1 capsule by mouth daily.    [provider]  rosuvastatin (CRESTOR) 40 MG tablet Take 1 tablet by mouth once daily 06/27/22   Lewayne Bunting, MD  Semaglutide, 2 MG/DOSE, 8 MG/3ML SOPN Inject 2 mg as directed once a week. 04/12/23   Thapa, [place], MD  silodosin (  RAPAFLO) 8 MG CAPS capsule Take 8 mg by mouth daily with breakfast.    [provider]  TRESIBA FLEXTOUCH 100 UNIT/ML FlexTouch Pen INJECT 46 UNITS SUBCUTANEOUSLY ONCE DAILY 06/16/23   Thapa, [place], MD    Family History Family History  Problem Relation Age of Onset   Heart disease Mother    Diabetes Mother    Hypertension Mother    Congestive Heart Failure Mother    Heart attack Mother    Hypertension Father    Colon cancer Neg Hx    Prostate cancer Neg Hx    Stomach cancer Neg Hx    Rectal cancer Neg Hx     Social History Social History   Tobacco Use   Smoking status: Former    Current packs/day: 0.00    Types: Cigarettes    Quit date: 06/26/1985    Years since quitting: 38.1   Smokeless tobacco: Never   Tobacco comments:    Former smoker, quit 1987  Vaping Use   Vaping status: Never Used  Substance Use Topics   Alcohol use: No    Comment:     Drug use: No     Allergies   Patient has no known allergies.   Review of Systems Review of Systems  Respiratory:  Positive for cough.   Neurological:  Positive for headaches.     Physical Exam Triage Vital Signs ED Triage Vitals  Encounter Vitals Group     BP 08/07/23 1356 133/76     Systolic BP Percentile --      Diastolic BP Percentile --      Pulse Rate 08/07/23 1356 97     Resp 08/07/23 1356 20     Temp  08/07/23 1356 (!) 101.4 F (38.6 C)     Temp Source 08/07/23 1356 Oral     SpO2 08/07/23 1356 93 %     Weight --      Height --      Head Circumference --      Peak Flow --      Pain Score 08/07/23 1359 4     Pain Loc --      Pain Education --      Exclude from Growth Chart --    No data found.  Updated Vital Signs BP 133/76 (BP Location: Right Arm)   Pulse 97   Temp (!) 101.4 F (38.6 C) (Oral)   Resp 20   SpO2 93%   Visual Acuity Right Eye Distance:   Left Eye Distance:   Bilateral Distance:    Right Eye Near:   Left Eye Near:    Bilateral Near:     Physical Exam Vitals reviewed.  Constitutional:      Appearance: He is obese. He is ill-appearing.  HENT:     Head: Normocephalic and atraumatic.     Nose: Congestion and rhinorrhea present.  Eyes:     Extraocular Movements: Extraocular movements intact.     Pupils: Pupils are equal, round, and reactive to light.  Cardiovascular:     Rate and Rhythm: Normal rate. Rhythm irregular.  Pulmonary:     Effort: Pulmonary effort is normal.     Breath sounds: Normal breath sounds.  Musculoskeletal:        General: Normal range of motion.     Cervical back: Normal range of motion and neck supple.  Skin:    General: Skin is warm and dry.  Neurological:     Mental Status: He is  alert.     GCS: GCS eye subscore is 4. GCS verbal subscore is 5. GCS motor subscore is 6.      UC Treatments / Results  Labs (all labs ordered are listed, but only abnormal results are displayed) Labs Reviewed  POC COVID19/FLU A&B COMBO - Abnormal; Notable for the following components:      Result Value   Influenza A Antigen, POC Positive (*)    All other components within normal limits    EKG   Radiology No results found.  Procedures Procedures (including critical care time)  Medications Ordered in UC Medications  acetaminophen (TYLENOL) tablet 975 mg (975 mg Oral Given 08/07/23 1429)    Initial Impression / Assessment and  Plan / UC Course  I have reviewed the triage vital signs and the nursing notes.  Pertinent labs & imaging results that were available during my care of the patient were reviewed by me and considered in my medical decision making (see chart for details).    1. Influenza A (Primary) - POC Covid19/Flu A&B Antigen, positive Flu A  - oseltamivir (TAMIFLU) 75 MG capsule; Take 1 capsule (75 mg total) by mouth 2 (two) times daily.  Dispense: 10 capsule; Refill: 0 - benzonatate (TESSALON) 100 MG capsule; Take 2 capsules (200 mg total) by mouth 3 (three) times daily as needed for cough.  Dispense: 40 capsule; Refill: 0 - acetaminophen (TYLENOL) tablet 975 mg  2. Paroxysmal atrial fibrillation (HCC) - POC Covid19/Flu A&B Antigen Irregular heart rhythm with normal rate, patient is anticoagulated and has hx of an ablation. Patient encouraged to monitor and ER precautions given due to recent dx Influenza.  3. Controlled type 2 diabetes mellitus with complication, with long-term current use of insulin (HCC) - benzonatate (TESSALON) 100 MG capsule; Take 2 capsules (200 mg total) by mouth 3 (three) times daily as needed for cough.  Dispense: 40 capsule; Refill: 0  -Discussed glucose level will likely be higher than baseline due current influenza diagnosis. Educated to avoid taking OTC cough syrup as those medication can increase blood glucose. Tessalon pearls prescribed for management of cough. Strict ER precautions given if symptoms become severe. Return here for follow-up with PCP if symptoms remain unchanged after completing treatment.  Final Clinical Impressions(s) / UC Diagnoses   Final diagnoses:  Influenza A     Discharge Instructions      Influenza test is positive.  You are considered contagious to others as long as you have a measurable fever with a temperature 100 F.  You should consider yourself infectious until you are fever free for 24 hours without fever lowering medications. Continue  to alternate Tylenol and ibuprofen for management of fever.   Force fluids to maintain hydration. Tamiflu twice daily for the next 5 days to reduce symptoms and course of influenza virus.   If you develop any shortness of breath, wheezing or difficulty breathing go immediately to the nearest emergency department.       ED Prescriptions     Medication Sig Dispense Auth. Provider   oseltamivir (TAMIFLU) 75 MG capsule Take 1 capsule (75 mg total) by mouth 2 (two) times daily. 10 capsule Bing Neighbors, NP   benzonatate (TESSALON) 100 MG capsule Take 2 capsules (200 mg total) by mouth 3 (three) times daily as needed for cough. 40 capsule Bing Neighbors, NP      PDMP not reviewed this encounter.   Bing Neighbors, NP 08/07/23 1441

## 2023-08-08 NOTE — Progress Notes (Deleted)
  Cardiology Office Note:  .   Date:  08/08/2023  ID:  SEVEN DOLLENS, DOB 03-11-1961, MRN [id number] PCP: Wanda Plump, MD  [facility] Providers Cardiologist:  Olga Millers, MD Electrophysiologist:  Hillis Range, MD (Inactive)   History of Present Illness: .   Danny [name] is a 63 y.o. male with a past medical history of paroxysmal atrial fibrillation, CAD, HTN, HLD. Patient is followed by Dr. Jens Som and presents today for evaluation of swelling.   Patient previously underwent afib ablation in 02/2014 with Dr. Johney Frame. More recently, he wore a cardiac monitor in 07/2021 that showed no atrial fibrillation, rare ventricular ectopy, occasional supraventricular ectopy (2%). Cardiac CTA on 08/15/2021 showed a calcium score of 2233 (99th percentile). Study was sent for [facility] which was negative. He underwent echocardiogram on 08/18/21 that showed EF 60-65%, no regional wall motion abnormalities, normal RV systolic function, no significant valvular abnormalities. Carotid ultrasounds on 08/23/21 showed no significant stenosis.   Cardiac catheterization in 08/2021 showed focal 50% mid LAD stenosis, 20% ostial RI stenosis, 5-10% prox circumflex stenosis, 20% prox RCA stenosis, and 30-40% mid RCA stenosis. Recommended medical management of nonobstructive CAD   Lower Extremity Edema  - Echo from 07/2021 showed EF 60-65%, no regional wall motion abnormalities, normal RV function, no significant valvular abnormalities   Nonobstructive CAD  - Cardiac catheterization from 08/2021 showed 50% mild LAD stenosis, 20% ostial RI stenosis, 5-10% prox circumflex stenosis, 20% prox RCA stenosis, and 30-40% mid RCA stenosis -  - Not on ASA with eliquis use  - Continue crestor 40 mg daily   Paroxysmal Atrial Fibrillation  - Previously underwent ablation in 2015  - Cardiac monitor in 07/2021 showed no atrial fibrillation  -  - Continue eliquis 5 mg BID   HTN  - BP currently well controlled  - Continue  irbesartan 150 mg daily  - Labwork from 08/06/23 showed K 5.0, creatinine 0.86   HLD  - Lipid panel from 01/2023 showed LDL 17, HDL 31, triglycerides 98, total cholesterol 67 - Continue crestor 40 mg daily   ROS: ***  Studies Reviewed: .        *** Risk Assessment/Calculations:   {Does this patient have ATRIAL FIBRILLATION?:[phone]}         Physical Exam:   VS:  There were no vitals taken for this visit.   Wt Readings from Last 3 Encounters:  04/12/23 224 lb 9.6 oz (101.9 kg)  04/10/23 226 lb 8 oz (102.7 kg)  02/22/23 221 lb (100.2 kg)    GEN: Well nourished, well developed in no acute distress NECK: No JVD; No carotid bruits CARDIAC: ***RRR, no murmurs, rubs, gallops RESPIRATORY:  Clear to auscultation without rales, wheezing or rhonchi  ABDOMEN: Soft, non-tender, non-distended EXTREMITIES:  No edema; No deformity   ASSESSMENT AND PLAN: .   ***    {Are you ordering a CV Procedure (e.g. stress test, cath, DCCV, TEE, etc)?   Press F2        :[id number]}  Dispo: ***  Signed, Jonita Albee, PA-C

## 2023-08-13 ENCOUNTER — Ambulatory Visit: Payer: 59 | Admitting: Cardiology

## 2023-08-13 ENCOUNTER — Ambulatory Visit: Payer: BC Managed Care – PPO | Admitting: Endocrinology

## 2023-08-15 ENCOUNTER — Ambulatory Visit: Payer: 59 | Admitting: Internal Medicine

## 2023-08-22 ENCOUNTER — Ambulatory Visit (INDEPENDENT_AMBULATORY_CARE_PROVIDER_SITE_OTHER): Payer: 59 | Admitting: Internal Medicine

## 2023-08-22 ENCOUNTER — Encounter: Payer: Self-pay | Admitting: Internal Medicine

## 2023-08-22 VITALS — BP 130/80 | HR 74 | Temp 97.9°F | Ht 71.0 in | Wt 222.0 lb

## 2023-08-22 DIAGNOSIS — I10 Essential (primary) hypertension: Secondary | ICD-10-CM | POA: Diagnosis not present

## 2023-08-22 DIAGNOSIS — R42 Dizziness and giddiness: Secondary | ICD-10-CM | POA: Diagnosis not present

## 2023-08-22 DIAGNOSIS — M542 Cervicalgia: Secondary | ICD-10-CM

## 2023-08-22 NOTE — Patient Instructions (Signed)
 Will refer you to the ENT doctor If you have more episodes or severe dizziness - seek medical attention  Neck pain: Tylenol  500 mg OTC 2 tabs a day every 8 hours as needed for pain Heating pad  Call if no gradually better    Check the  blood pressure regularly Blood pressure goal:  between 110/65 and  135/85. If it is consistently higher or lower, let me know   Please go to the front desk and schedule the following: A routine visit in 6 months

## 2023-08-22 NOTE — Progress Notes (Unsigned)
 Subjective:    Patient ID: Danny [name], male    DOB: [birth year], 63 y.o.   MRN: [id number]  DOS:  08/22/2023 Type of visit - description: Follow-up  On 07/28/2023, he got up and immediately got dizzy , described as spinning, got nausea and vomited once. He denies any headache, chest pain, diplopia, slurred speech or motor deficit.  Admits to some perioral numbness. Went to urgent care, was evaluated, got a EKG reportedly WNL and was prescribed Antivert. No further episodes.  Was seen 08/07/2023 at the ER with influenza, feels better.  Has severe  pain for the last couple of days located at the right side of the posterior head.  Using a heating pad. Denies radiation to the shoulder or arms.  No paresthesias of the upper extremities No ear pain or ear discharge.  Review of Systems See above   Past Medical History:  Diagnosis Date   Allergic rhinitis    Benign prostatic hypertrophy    s/p TUNA procedure aprx 2006,   Carpal tunnel syndrome 06/17/2009   Diabetes mellitus    Type II dx 1999   DM type 2 with diabetic peripheral neuropathy (HCC) 10/15/2014   ED (erectile dysfunction)    After TUNA   Elevated LFTs 2010   (-) chronic hep  panel   History of cardiovascular stress test 8/11   Negative   Hyperlipidemia    HYPERSOMNIA UNSPECIFIED 12/11/2007   Hypertension    Obesity    PAF (paroxysmal atrial fibrillation) (HCC) 12/21/2009   a. Failed Multaq. b. Prev on flecainide. c. s/p afib ablation 2015.    Past Surgical History:  Procedure Laterality Date   ABLATION  03/24/2014   PVI by Dr Johney Frame   ATRIAL FIBRILLATION ABLATION N/A 03/24/2014   Procedure: ATRIAL FIBRILLATION ABLATION;  Surgeon: Gardiner Rhyme, MD;  Location: [facility] CATH LAB;  Service: Cardiovascular;  Laterality: N/A;   COLONOSCOPY  11/25/2012   Mild sigmoid diverticulosis, small internal hemorrhoids, otherwise normal colonoscopy   CYSTOSCOPY  04/26/2008   neg  @ urology, was rx Flomax   LEFT HEART CATH AND  CORONARY ANGIOGRAPHY N/A 08/30/2021   Procedure: LEFT HEART CATH AND CORONARY ANGIOGRAPHY;  Surgeon: Lennette Bihari, MD;  Location: [facility] INVASIVE CV LAB;  Service: Cardiovascular;  Laterality: N/A;   PROSTATE SURGERY     TUNA   TEE WITHOUT CARDIOVERSION N/A 03/23/2014   Procedure: TRANSESOPHAGEAL ECHOCARDIOGRAM (TEE);  Surgeon: Pricilla Riffle, MD;  Location: [facility] ENDOSCOPY;  Service: Cardiovascular;  Laterality: N/A;   VASECTOMY     several years ago    Current Outpatient Medications  Medication Instructions   acetaminophen (TYLENOL) 650 mg, Every 8 hours PRN   azelastine (ASTELIN) 0.1 % nasal spray 2 sprays, Each Nare, 2 times daily, Use in each nostril as directed   BD VEO INSULIN SYRINGE U/F 31G X 15/64" 0.5 ML MISC USE 1 SYRINGE AS DIRECTED 3 TO 4 TIMES DAILY   benzonatate (TESSALON) 200 mg, Oral, 3 times daily PRN   Blood Glucose Monitoring Suppl (ONETOUCH VERIO REFLECT) w/Device KIT 1   Continuous Glucose Sensor (DEXCOM G7 SENSOR) MISC Change every 10 days   Dapagliflozin Pro-metFORMIN ER (XIGDUO XR) 10-998 MG TB24 1 tablet, Oral, 2 times daily   dicyclomine (BENTYL) 10 MG capsule TAKE 1 CAPSULE(10 MG) BY MOUTH FOUR TIMES DAILY BEFORE MEALS AND AT BEDTIME   ELIQUIS 5 MG TABS tablet TAKE 1 TABLET(5 MG) BY MOUTH TWICE DAILY   fluticasone (FLONASE) 50 MCG/ACT nasal  spray 2 sprays, Each Nare, Daily PRN   Glucagon (BAQSIMI ONE PACK) 3 MG/DOSE POWD To be sprayed in nostril as needed for severe hypoglycemia   glucose blood (ONETOUCH VERIO) test strip USE AS DIRECTED TO CHECK BLOOD SUGAR 4 TIMES DAILY   irbesartan (AVAPRO) 150 mg, Oral, Daily   Lancets (ONETOUCH DELICA PLUS LANCET33G) MISC Use to check blood sugar 4 times per day   loratadine (CLARITIN) 10 mg, Daily   Multiple Vitamins-Minerals (MULTIVITAMIN WITH MINERALS) tablet 1 tablet, Daily   nitroGLYCERIN (NITROSTAT) 0.4 MG SL tablet DISSOLVE ONE TABLET UNDER THE TONGUE EVERY 5 MINUTES AS NEEDED FOR CHEST PAIN.  DO NOT EXCEED A TOTAL OF 3  DOSES IN 15 MINUTES   NOVOLOG FLEXPEN 100 UNIT/ML FlexPen INJECT 10 TO 15 UNITS SUBCUTANEOUSLY THREE TIMES DAILY BEFORE MEAL(S)   omega-3 acid ethyl esters (LOVAZA) 1 g capsule Take by mouth.   oseltamivir (TAMIFLU) 75 mg, Oral, 2 times daily   pantoprazole (PROTONIX) 40 MG tablet TAKE 1 TABLET BY MOUTH ONCE DAILY 30  MINUTES  PRIOR  TO  EATING   Polyethyl Glyc-Propyl Glyc PF 0.4-0.3 % SOLN 2 drops, 3 times daily PRN   pregabalin (LYRICA) 100 mg, Oral, 3 times daily   Probiotic Product (PROBIOTIC DAILY PO) 1 capsule, Daily   rosuvastatin (CRESTOR) 40 mg, Oral, Daily   Semaglutide (2 MG/DOSE) 2 mg, Injection, Weekly   silodosin (RAPAFLO) 8 mg, Daily with breakfast   TRESIBA FLEXTOUCH 100 UNIT/ML FlexTouch Pen INJECT 46 UNITS SUBCUTANEOUSLY ONCE DAILY       Objective:   Physical Exam Neck:     BP 130/80   Pulse 74   Temp 97.9 F (36.6 C)   Ht 5\' 11"  (1.803 m)   Wt 222 lb (100.7 kg)   SpO2 96%   BMI 30.96 kg/m  General:   Well developed, NAD, BMI noted. HEENT:  Normocephalic . Face symmetric, atraumatic. EOMI, pupils equal and reactive. Neck: No TTP at the cervical spine, see graphic.  Normal carotid pulses Lungs:  CTA B Normal respiratory effort, no intercostal retractions, no accessory muscle use. Heart: RRR,  no murmur.  Lower extremities: no pretibial edema bilaterally  Skin: Not pale. Not jaundice Neurologic:  alert & oriented X3.  Speech normal, gait appropriate for age and unassisted.  Motor symmetric. Psych--  Cognition and judgment appear intact.  Cooperative with normal attention span and concentration.  Behavior appropriate. No anxious or depressed appearing.      Assessment     Assessment DM -- sees endo Neuropathy due to DM: NCV 2016: + Peripheral neuropathy HTN Hyperlipidemia CV: - Atrial fibrillation, 2011, s/p ablation 02-2014, now sinus rhythm, anticoagulated -High calcium coronary score, cardiac catheterization: Nonobstructive disease BPH  s/p TUNA ED S/P shingles, postherpetic neuralgia R foot TIA (?) 10-2016, saw neuro, Rx carotid US: L ICA 1-39% otherwise (-).  Carotid US negative (01-2020)  PLAN DM: Per Endo HTN: Well-controlled.  Continue amlodipine, losartan, recent BMP okay. Dizziness: Single episode, likely a peripheral issue.  Neuroexam essentially normal.  Pt strongly request ENT referral, will do.  ER if symptoms severe, repetitive or different.  See AVS. Pain, R nuchal area: Likely a MSK issue.  Recommend Tylenol, continue using a heating pad, call if not gradually better. RTC 6 months

## 2023-08-23 NOTE — Assessment & Plan Note (Signed)
 DM: Per Endo HTN: Well-controlled.  Continue amlodipine, losartan, recent BMP okay. Dizziness: Single episode, likely a peripheral issue.  Neuroexam essentially normal.  Pt strongly request ENT referral, will do.  ER if symptoms severe, repetitive or different.  See AVS. Pain, R nuchal area: Likely a MSK issue.  Recommend Tylenol, continue using a heating pad, call if not gradually better. RTC 6 months

## 2023-08-24 ENCOUNTER — Other Ambulatory Visit: Payer: Self-pay | Admitting: Endocrinology

## 2023-08-24 DIAGNOSIS — E118 Type 2 diabetes mellitus with unspecified complications: Secondary | ICD-10-CM

## 2023-08-27 ENCOUNTER — Ambulatory Visit: Payer: BC Managed Care – PPO | Admitting: Podiatry

## 2023-08-27 ENCOUNTER — Other Ambulatory Visit: Payer: Self-pay

## 2023-08-27 ENCOUNTER — Encounter (HOSPITAL_BASED_OUTPATIENT_CLINIC_OR_DEPARTMENT_OTHER): Payer: Self-pay | Admitting: Emergency Medicine

## 2023-08-27 ENCOUNTER — Ambulatory Visit (INDEPENDENT_AMBULATORY_CARE_PROVIDER_SITE_OTHER)
Admit: 2023-08-27 | Discharge: 2023-08-27 | Disposition: A | Discharge status: Home / Self Care | Attending: Family Medicine | Admitting: Family Medicine

## 2023-08-27 ENCOUNTER — Ambulatory Visit (HOSPITAL_BASED_OUTPATIENT_CLINIC_OR_DEPARTMENT_OTHER)
Admission: EM | Admit: 2023-08-27 | Discharge: 2023-08-27 | Disposition: A | Discharge status: Home / Self Care | Attending: Family Medicine | Admitting: Family Medicine

## 2023-08-27 DIAGNOSIS — L84 Corns and callosities: Secondary | ICD-10-CM

## 2023-08-27 DIAGNOSIS — R051 Acute cough: Secondary | ICD-10-CM

## 2023-08-27 DIAGNOSIS — E0840 Diabetes mellitus due to underlying condition with diabetic neuropathy, unspecified: Secondary | ICD-10-CM

## 2023-08-27 DIAGNOSIS — R112 Nausea with vomiting, unspecified: Secondary | ICD-10-CM | POA: Diagnosis not present

## 2023-08-27 DIAGNOSIS — B351 Tinea unguium: Secondary | ICD-10-CM | POA: Diagnosis not present

## 2023-08-27 DIAGNOSIS — Z794 Long term (current) use of insulin: Secondary | ICD-10-CM

## 2023-08-27 DIAGNOSIS — R509 Fever, unspecified: Secondary | ICD-10-CM

## 2023-08-27 DIAGNOSIS — M79674 Pain in right toe(s): Secondary | ICD-10-CM | POA: Diagnosis not present

## 2023-08-27 DIAGNOSIS — M79675 Pain in left toe(s): Secondary | ICD-10-CM

## 2023-08-27 DIAGNOSIS — R059 Cough, unspecified: Secondary | ICD-10-CM | POA: Diagnosis not present

## 2023-08-27 LAB — POC COVID19/FLU A&B COMBO
Covid Antigen, POC: NEGATIVE
Influenza A Antigen, POC: NEGATIVE
Influenza B Antigen, POC: NEGATIVE

## 2023-08-27 MED ORDER — ONDANSETRON 4 MG PO TBDP: 4.0000 mg | ORAL_TABLET | Freq: Three times a day (TID) | ORAL | 0 refills | Status: AC | PRN

## 2023-08-27 MED ORDER — PREGABALIN 100 MG PO CAPS: 100.0000 mg | ORAL_CAPSULE | Freq: Three times a day (TID) | ORAL | 3 refills | Status: DC

## 2023-08-27 MED ORDER — DAPAGLIFLOZIN PRO-METFORMIN ER 5-1000 MG PO TB24: 1.0000 | ORAL_TABLET | Freq: Two times a day (BID) | ORAL | 1 refills | Status: DC

## 2023-08-27 MED ORDER — ONDANSETRON 4 MG PO TBDP
4.0000 mg | ORAL_TABLET | Freq: Once | ORAL | Status: AC
  Administered 2023-08-27: 4 mg via ORAL

## 2023-08-27 NOTE — ED Provider Notes (Signed)
 Danny [name] CARE    CSN: [id number] Arrival date & time: 08/27/23  1634      History   Chief Complaint Chief Complaint  Patient presents with   Vomiting    HPI Danny [name] is a 63 y.o. male.   Is seen on 08/07/2023 for influenza type A.  He was treated with Tamiflu 75 mg twice daily for 5 days.  He felt that he got better from the influenza.  But today he was at work and started vomiting around 2:00 PM.  He was thinks he vomited 12 or 15 times.  He has tried to drink water but even vomits the water.  He has developed a low-grade fever today and a persistent cough.  His oxygen saturation is 92% on room air when I went in to examine him.  The triage nurse had an oxygen saturation of 94% at her exam.     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 Failed Multaq. b. Prev on flecainide. c. s/p afib ablation 2015.    Patient Active Problem List   Diagnosis Date Noted   Elevated coronary artery calcium score    Secondary hypercoagulable state (HCC) 11/16/2020   Abdominal pain, vomiting, and diarrhea    GI bleed 10/31/2016   Gross hematuria 07/26/2016   Obesity 01/21/2016   PCP NOTES >>>>>>>>>>>>>>>>>>>>>>> 08/12/2015   Bilateral foot pain 09/10/2014   Annual physical exam 01/13/2011   Hyperlipidemia    Hypertension    Paroxysmal atrial fibrillation (HCC) 12/21/2009   DYSLIPIDEMIA 12/14/2009   CARPAL TUNNEL SYNDROME 06/17/2009   Controlled type 2 diabetes  mellitus with complication, with long-term current use of insulin (HCC) 03/12/2007   Allergic rhinitis 03/12/2007   BENIGN PROSTATIC HYPERTROPHY 03/12/2007   ERECTILE DYSFUNCTION, 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    Past Surgical History:  Procedure Laterality Date   ABLATION  03/24/2014   PVI by Dr Johney Frame   ATRIAL FIBRILLATION ABLATION N/A 03/24/2014   Procedure: ATRIAL FIBRILLATION ABLATION;  Surgeon: Gardiner Rhyme, MD;  Location: [facility] CATH LAB;  Service: Cardiovascular;  Laterality: N/A;   COLONOSCOPY  11/25/2012   Mild sigmoid diverticulosis, small internal hemorrhoids, otherwise normal colonoscopy   CYSTOSCOPY  04/26/2008   neg  @ urology, was rx Flomax   LEFT HEART CATH AND CORONARY ANGIOGRAPHY N/A 08/30/2021   Procedure: LEFT HEART CATH AND CORONARY ANGIOGRAPHY;  Surgeon: Lennette Bihari, MD;  Location: [facility] INVASIVE CV LAB;  Service: Cardiovascular;  Laterality: N/A;   PROSTATE SURGERY     TUNA   TEE WITHOUT CARDIOVERSION N/A 03/23/2014   Procedure: TRANSESOPHAGEAL ECHOCARDIOGRAM (TEE);  Surgeon: Pricilla Riffle, MD;  Location: [facility] ENDOSCOPY;  Service: Cardiovascular;  Laterality: N/A;   VASECTOMY     several years ago       Home Medications    Prior to Admission medications   Medication Sig Start Date End Date Taking?  Authorizing Provider  Dapagliflozin Pro-metFORMIN ER (XIGDUO XR) 10-998 MG TB24 Take 1 tablet by mouth 2 (two) times daily. 08/27/23  Yes Thapa, [place], MD  dicyclomine (BENTYL) 10 MG capsule TAKE 1 CAPSULE(10 MG) BY MOUTH FOUR TIMES DAILY BEFORE MEALS AND AT BEDTIME 07/03/22  Yes Lynann Bologna, MD  ELIQUIS 5 MG TABS tablet TAKE 1 TABLET(5 MG) BY MOUTH TWICE DAILY 04/16/23  Yes Lewayne Bunting, MD  irbesartan (AVAPRO) 150 MG tablet Take 1 tablet by mouth once daily 04/30/23  Yes Crenshaw, Madolyn Frieze, MD  ondansetron (ZOFRAN-ODT) 4 MG disintegrating tablet Take 1 tablet (4 mg total) by mouth every 8 (eight) hours as needed for nausea or vomiting. 08/27/23  Yes Prescilla Sours, FNP   pregabalin (LYRICA) 100 MG capsule Take 1 capsule (100 mg total) by mouth 3 (three) times daily. 08/27/23  Yes Semon, Jake L, DPM  rosuvastatin (CRESTOR) 40 MG tablet Take 1 tablet by mouth once daily 06/27/22  Yes Crenshaw, Madolyn Frieze, MD  silodosin (RAPAFLO) 8 MG CAPS capsule Take 8 mg by mouth daily with breakfast.   Yes [provider]  acetaminophen (TYLENOL) 650 MG CR tablet Take 650 mg by mouth every 8 (eight) hours as needed for pain.    [provider]  azelastine (ASTELIN) 0.1 % nasal spray Place 2 sprays into both nostrils 2 (two) times daily. Use in each nostril as directed 12/14/20   Wanda Plump, MD  BD VEO INSULIN SYRINGE U/F 31G X 15/64" 0.5 ML MISC USE 1 SYRINGE AS DIRECTED 3 TO 4 TIMES DAILY 07/10/19   Reather Littler, MD  Blood Glucose Monitoring Suppl (ONETOUCH VERIO REFLECT) w/Device KIT 1 10/04/21   Reather Littler, MD  Continuous Glucose Sensor (DEXCOM G7 SENSOR) MISC CHANGE EVERY 10 DAYS 08/24/23   Thapa, [place], MD  fluticasone (FLONASE) 50 MCG/ACT nasal spray Place 2 sprays into both nostrils daily as needed for allergies or rhinitis. 04/24/23   Wanda Plump, MD  Glucagon (BAQSIMI ONE PACK) 3 MG/DOSE POWD To be sprayed in nostril as needed for severe hypoglycemia 10/10/21   Reather Littler, MD  glucose blood (ONETOUCH VERIO) test strip USE AS DIRECTED TO CHECK BLOOD SUGAR 4 TIMES DAILY 05/15/22   Reather Littler, MD  Lancets [facility] DELICA PLUS [place]) MISC Use to check blood sugar 4 times per day 10/04/21   Reather Littler, MD  loratadine (CLARITIN) 10 MG tablet Take 10 mg by mouth daily.    [provider]  Multiple Vitamins-Minerals (MULTIVITAMIN WITH MINERALS) tablet Take 1 tablet by mouth daily.    [provider]  nitroGLYCERIN (NITROSTAT) 0.4 MG SL tablet DISSOLVE ONE TABLET UNDER THE TONGUE EVERY 5 MINUTES AS NEEDED FOR CHEST PAIN.  DO NOT EXCEED A TOTAL OF 3 DOSES IN 15 MINUTES 09/11/22   Lewayne Bunting, MD  NOVOLOG FLEXPEN 100 UNIT/ML FlexPen INJECT 10 TO  15 UNITS SUBCUTANEOUSLY THREE TIMES DAILY BEFORE MEAL(S) 04/12/23   Thapa, [place], MD  omega-3 acid ethyl esters (LOVAZA) 1 g capsule Take by mouth.    [provider]  pantoprazole (PROTONIX) 40 MG tablet TAKE 1 TABLET BY MOUTH ONCE DAILY 30  MINUTES  PRIOR  TO  EATING 04/30/23   Lynann Bologna, MD  Polyethyl Glyc-Propyl Glyc PF 0.4-0.3 % SOLN Apply 2 drops to eye 3 (three) times daily as needed (for dry eyes).    [provider]  Probiotic Product (PROBIOTIC DAILY PO) Take 1 capsule by mouth daily.    [provider]  Semaglutide, 2 MG/DOSE, 8 MG/3ML SOPN Inject 2 mg as directed once a week. 04/12/23   Thapa, [place], MD  TRESIBA FLEXTOUCH 100 UNIT/ML FlexTouch Pen INJECT 46 UNITS SUBCUTANEOUSLY ONCE DAILY 06/16/23   Thapa, [place], MD    Family History Family History  Problem Relation Age of Onset   Heart disease Mother    Diabetes Mother    Hypertension Mother    Congestive Heart Failure Mother    Heart attack Mother    Hypertension Father    Colon cancer Neg Hx    Prostate cancer Neg Hx    Stomach cancer Neg Hx    Rectal cancer Neg Hx     Social History Social History   Tobacco Use   Smoking status: Former    Current packs/day: 0.00    Types: Cigarettes    Quit date: 06/26/1985    Years since quitting: 38.1   Smokeless tobacco: Never   Tobacco comments:    Former smoker, quit 1987  Vaping Use   Vaping status: Never Used  Substance Use Topics   Alcohol use: No    Comment:     Drug use: No     Allergies   Patient has no known allergies.   Review of Systems Review of Systems  Constitutional:  Positive for fever. Negative for chills.  HENT:  Negative for ear pain and sore throat.   Eyes:  Negative for pain and visual disturbance.  Respiratory:  Positive for cough.   Cardiovascular:  Negative for chest pain and palpitations.  Gastrointestinal:  Positive for abdominal pain, nausea and vomiting. Negative for constipation and diarrhea.   Genitourinary:  Negative for dysuria and hematuria.  Musculoskeletal:  Negative for arthralgias and back pain.  Skin:  Negative for color change and rash.  Neurological:  Negative for seizures and syncope.  All other systems reviewed and are negative.    Physical Exam Triage Vital Signs ED Triage Vitals  Encounter Vitals Group     BP 08/27/23 1721 121/69     Systolic BP Percentile --      Diastolic BP Percentile --      Pulse Rate 08/27/23 1721 (!) 113     Resp 08/27/23 1721 20     Temp 08/27/23 1721 99.4 F (37.4 C)     Temp Source 08/27/23 1721 Oral     SpO2 08/27/23 1721 94 %     Weight --      Height --      Head Circumference --      Peak Flow --      Pain Score 08/27/23 1720 0     Pain Loc --      Pain Education --      Exclude from Growth Chart --    No data found.  Updated Vital Signs BP 121/69 (BP Location: Right Arm)   Pulse (!) 113   Temp 99.4 F (37.4 C) (Oral)   Resp 20   SpO2 94%   Visual Acuity Right Eye Distance:   Left Eye Distance:   Bilateral Distance:    Right Eye Near:   Left Eye Near:    Bilateral Near:     Physical Exam Vitals and nursing note reviewed.  Constitutional:      General: He is not in acute distress.    Appearance: He is well-developed. He is ill-appearing. He is not toxic-appearing.  HENT:     Head: Normocephalic and atraumatic.     Right Ear: Hearing, tympanic  membrane, ear canal and external ear normal.     Left Ear: Hearing, tympanic membrane, ear canal and external ear normal.     Nose: Congestion and rhinorrhea present. Rhinorrhea is clear.     Right Sinus: No maxillary sinus tenderness or frontal sinus tenderness.     Left Sinus: No maxillary sinus tenderness or frontal sinus tenderness.     Mouth/Throat:     Lips: Pink.     Mouth: Mucous membranes are moist.     Pharynx: Uvula midline. No oropharyngeal exudate or posterior oropharyngeal erythema.     Tonsils: No tonsillar exudate.  Eyes:      Conjunctiva/sclera: Conjunctivae normal.     Pupils: Pupils are equal, round, and reactive to light.  Cardiovascular:     Rate and Rhythm: Tachycardia present. Rhythm irregular.     Heart sounds: S1 normal and S2 normal. No murmur heard. Pulmonary:     Effort: Pulmonary effort is normal. No respiratory distress.     Breath sounds: Examination of the right-upper field reveals decreased breath sounds. Examination of the left-upper field reveals decreased breath sounds. Decreased breath sounds present. No wheezing, rhonchi or rales.  Abdominal:     General: Bowel sounds are normal.     Palpations: Abdomen is soft.     Tenderness: There is no abdominal tenderness.  Musculoskeletal:        General: No swelling.     Cervical back: Neck supple.  Lymphadenopathy:     Head:     Right side of head: No submental, submandibular, tonsillar, preauricular or posterior auricular adenopathy.     Left side of head: No submental, submandibular, tonsillar, preauricular or posterior auricular adenopathy.     Cervical: No cervical adenopathy.     Right cervical: No superficial cervical adenopathy.    Left cervical: No superficial cervical adenopathy.  Skin:    General: Skin is warm and dry.     Capillary Refill: Capillary refill takes less than 2 seconds.     Findings: No rash.  Neurological:     Mental Status: He is alert and oriented to person, place, and time.  Psychiatric:        Mood and Affect: Mood normal.      UC Treatments / Results  Labs (all labs ordered are listed, but only abnormal results are displayed) Labs Reviewed  POC COVID19/FLU A&B COMBO - Normal    EKG   Radiology No results found.  Procedures Procedures (including critical care time)  Medications Ordered in UC Medications  ondansetron (ZOFRAN-ODT) disintegrating tablet 4 mg (4 mg Oral Given 08/27/23 1820)    Initial Impression / Assessment and Plan / UC Course  I have reviewed the triage vital signs and the  nursing notes.  Pertinent labs & imaging results that were available during my care of the patient were reviewed by me and considered in my medical decision making (see chart for details).     Negative for flu and COVID.  Chest x-ray appears negative.  Will contact patient if radiology review differs from mine.  Plenty of fluids and rest.  Given ondansetron 4 mg ODT during the visit.  Provided ondansetron 4 mg ODT 20, 1, every 8 hours, if needed for nausea and vomiting to use at home.  Follow-up if symptoms do not improve, worsen or new symptoms occur.  Work excuse provided. Final Clinical Impressions(s) / UC Diagnoses   Final diagnoses:  Fever, unspecified  Acute cough  Nausea and vomiting, unspecified vomiting  type     Discharge Instructions      Negative for flu and COVID.  He was treated for influenza type a 2 to 3 weeks ago.  His chest x-ray appears negative.  Will contact him if the radiology review differs from mine.  Get plenty of fluids and rest.  Work excuse provided.  Given ondansetron, 4 mg melt on tongue during the visit.  Provided ondansetron, 4 mg ODT, melt on tongue, every 8 hours if needed for nausea and vomiting at home.  Follow-up if symptoms do not improve, worsen or new symptoms occur.     ED Prescriptions     Medication Sig Dispense Auth. Provider   ondansetron (ZOFRAN-ODT) 4 MG disintegrating tablet Take 1 tablet (4 mg total) by mouth every 8 (eight) hours as needed for nausea or vomiting. 20 tablet Prescilla Sours, FNP      PDMP not reviewed this encounter.   Prescilla Sours, FNP 08/27/23 [id number]

## 2023-08-27 NOTE — ED Triage Notes (Signed)
 Pt reports started today nauseated, vomiting, abdominal pain.

## 2023-08-27 NOTE — Progress Notes (Signed)
  Subjective:  Patient ID: Danny [name], male    DOB: April 20, 1961,  MRN: [id number]  Chief Complaint  Patient presents with   [facility]    [facility] with  callous care today. Last A1c in Feb was 7.3 and he takes Eliquis.     63 y.o. male presents with the above complaint. History confirmed with patient. Patient presenting with pain related to dystrophic thickened elongated nails. Patient is unable to trim own nails related to nail dystrophy and/or mobility issues. Patient does  have a history of T2DM. Patient does have callus present located at bilateral first and fifth met heads causing pain.  Patient does have neuropathic pain which is controlled with Lyrica currently.  Objective:  Physical Exam: warm, good capillary refill, diminished pedal hair growth nail exam onychomycosis of the toenails, onycholysis, and dystrophic nails DP pulses palpable, PT pulses palpable, and protective sensation intact, decreased vibratory sensation, subjective neuropathy symptoms. Left Foot:  Pain with palpation of nails due to elongation and dystrophic growth.  Hyperkeratotic lesion sub one and fifth metatarsal heads. Right Foot: Pain with palpation of nails due to elongation and dystrophic growth.  Right foot with hyperkeratotic lesion subone and subfifth metatarsal head.  Assessment:   1. Diabetes mellitus due to underlying condition, controlled, with diabetic neuropathy, with long-term current use of insulin (HCC)   2. Pain due to onychomycosis of toenails of both feet   3. Pre-ulcerative calluses         Plan:  Patient was evaluated and treated and all questions answered.   # Preulcerative calluses subfirst and subfifth metatarsal heads bilaterally -Preulcerative calluses x 4 were sharply debrided using 312 scalpel blade without incident. - Discussed home care for this, use white vinegar scrub and may gently use pumice stone to manage the calluses. -Counseled patient on diabetic foot  education   #Onychomycosis with pain  -Nails palliatively debrided as below. -Educated on self-care  Procedure: Nail Debridement Rationale: Pain Type of Debridement: manual, sharp debridement. Instrumentation: Nail nipper, rotary burr. Number of Nails: 10  # Diabetes with neuropathy -Patient educated on diabetes. Discussed proper diabetic foot care and discussed risks and complications of disease. Educated patient in depth on reasons to return to the office immediately should he/she discover anything concerning or new on the feet. All questions answered. Discussed proper shoes as well.  -Refill sent per patient's Lyrica 100 mg 3 times daily, this is controlling patient's pain well.  Tolerating this dose well.  Return in about 3 months (around 11/27/2023) for Diabetic Foot Care.         Bronwen Betters, DPM [facility] / [facility]

## 2023-08-27 NOTE — Discharge Instructions (Addendum)
 Negative for flu and COVID.  He was treated for influenza type a 2 to 3 weeks ago.  His chest x-ray appears negative.  Will contact him if the radiology review differs from mine.  Get plenty of fluids and rest.  Work excuse provided.  Given ondansetron, 4 mg melt on tongue during the visit.  Provided ondansetron, 4 mg ODT, melt on tongue, every 8 hours if needed for nausea and vomiting at home.  Follow-up if symptoms do not improve, worsen or new symptoms occur.

## 2023-08-28 NOTE — Progress Notes (Signed)
 Chest X-Ray IMPRESSION:  No active cardiopulmonary disease.  This is the same report/impression that the patient was given during his visit.  No change in the plan of care.

## 2023-09-06 ENCOUNTER — Other Ambulatory Visit: Payer: Self-pay | Admitting: Cardiology

## 2023-09-06 DIAGNOSIS — E785 Hyperlipidemia, unspecified: Secondary | ICD-10-CM

## 2023-09-10 ENCOUNTER — Ambulatory Visit: Payer: 59 | Admitting: Cardiology

## 2023-09-20 ENCOUNTER — Ambulatory Visit: Payer: BC Managed Care – PPO | Admitting: Endocrinology

## 2023-09-20 ENCOUNTER — Encounter: Payer: Self-pay | Admitting: Endocrinology

## 2023-09-20 VITALS — BP 122/70 | HR 69 | Ht 71.0 in | Wt 224.6 lb

## 2023-09-20 DIAGNOSIS — E1140 Type 2 diabetes mellitus with diabetic neuropathy, unspecified: Secondary | ICD-10-CM | POA: Diagnosis not present

## 2023-09-20 DIAGNOSIS — Z794 Long term (current) use of insulin: Secondary | ICD-10-CM | POA: Diagnosis not present

## 2023-09-20 NOTE — Progress Notes (Signed)
 Outpatient Endocrinology Note Danny Donzella Carrol, MD  09/20/23  Patient's Name: Danny [name]    DOB: Oct 25, 1960    MRN: [id number]                                                    REASON OF VISIT: Follow up for type 2 diabetes mellitus  PCP: Wanda Plump, MD  HISTORY OF PRESENT ILLNESS:   Danny [name] is a 63 y.o. old male with past medical history listed below, is here for follow up of type 2 diabetes mellitus.   Pertinent Diabetes History: _Diagnosed as Diabetes Mellitus type 2  in 1992. Insulin therapy was started in 2011.   Chronic Diabetes Complications : Retinopathy: no. Last ophthalmology exam was done on annually.  Nephropathy: no, on irbesartan Peripheral neuropathy: yes, numbness, tingling, followed by neurology on lyrica. Coronary artery disease: no Stroke: no  Relevant comorbidities and cardiovascular risk factors: Obesity: yes Body mass index is 31.33 kg/m.  Hypertension: yes Hyperlipidemia. Yes, on statin.   Current / Home Diabetic regimen includes: Tresiba 46 units daily.  Ozempic 2 mg weekly.  Xigduo XR 10/998 1 tab twice a day.  Novolog 15-20 units 2-3 times a day, with meals.  Sometimes he does not take NovoLog if blood sugar is low or or if he is physically active.  Prior diabetic medications: Metformin, stopped due to fear of liver problems. Januvia, Amaryl, Actos.   Glycemic data:    CONTINUOUS GLUCOSE MONITORING SYSTEM (CGMS) INTERPRETATION: At today's visit, we reviewed CGM downloads. The full report is scanned in the media. Reviewing the CGM trends, blood glucose are as follows:  Dexcom G7 CGM-  Sensor Download (Sensor download was reviewed and summarized below.) Dates: March 14 to September 20, 2023 for 14 days.  Glucose Management Indicator: 6.9%    Interpretation: Mostly acceptable blood sugar with rare random mild hyperglycemia with blood sugar up to 200 range with lunch or breakfast time at bedtime.  Blood sugar in between the meals  and overnight are acceptable.  No concerning hypoglycemia.  Hypoglycemia: Patient has no hypoglycemic episodes. Patient has hypoglycemia awareness.  Factors modifying glucose control: 1.  Diabetic diet assessment: 3 meals a day.   2.  Staying active or exercising:   3.  Medication compliance: compliant all of the time.  Interval history   Recent hemoglobin A1c 7.3%, GMI on CGM 6.9%.  Mostly acceptable blood sugar with random mild hyperglycemia on CGM.  Diabetes regimen as reviewed and noted above.  He has no other complaints today.  REVIEW OF SYSTEMS As per history of present illness.   PAST MEDICAL HISTORY: Past Medical History:  Diagnosis Date   Allergic rhinitis    Benign prostatic hypertrophy    s/p TUNA procedure aprx 2006,   Carpal tunnel syndrome 06/17/2009   Diabetes mellitus    Type II dx 1999   DM type 2 with diabetic peripheral neuropathy (HCC) 10/15/2014   ED (erectile dysfunction)    After TUNA   Elevated LFTs 2010   (-) chronic hep  panel   History of cardiovascular stress test 8/11   Negative   Hyperlipidemia    HYPERSOMNIA UNSPECIFIED 12/11/2007   Hypertension    Obesity    PAF (paroxysmal atrial fibrillation) (HCC) 12/21/2009   a. Failed Multaq. b. Prev on flecainide. c. s/p  afib ablation 2015.    PAST SURGICAL HISTORY: Past Surgical History:  Procedure Laterality Date   ABLATION  03/24/2014   PVI by Dr Johney Frame   ATRIAL FIBRILLATION ABLATION N/A 03/24/2014   Procedure: ATRIAL FIBRILLATION ABLATION;  Surgeon: Gardiner Rhyme, MD;  Location: [facility] CATH LAB;  Service: Cardiovascular;  Laterality: N/A;   COLONOSCOPY  11/25/2012   Mild sigmoid diverticulosis, small internal hemorrhoids, otherwise normal colonoscopy   CYSTOSCOPY  04/26/2008   neg  @ urology, was rx Flomax   LEFT HEART CATH AND CORONARY ANGIOGRAPHY N/A 08/30/2021   Procedure: LEFT HEART CATH AND CORONARY ANGIOGRAPHY;  Surgeon: Lennette Bihari, MD;  Location: [facility] INVASIVE CV LAB;  Service:  Cardiovascular;  Laterality: N/A;   PROSTATE SURGERY     TUNA   TEE WITHOUT CARDIOVERSION N/A 03/23/2014   Procedure: TRANSESOPHAGEAL ECHOCARDIOGRAM (TEE);  Surgeon: Pricilla Riffle, MD;  Location: [facility] ENDOSCOPY;  Service: Cardiovascular;  Laterality: N/A;   VASECTOMY     several years ago    ALLERGIES: No Known Allergies  FAMILY HISTORY:  Family History  Problem Relation Age of Onset   Heart disease Mother    Diabetes Mother    Hypertension Mother    Congestive Heart Failure Mother    Heart attack Mother    Hypertension Father    Colon cancer Neg Hx    Prostate cancer Neg Hx    Stomach cancer Neg Hx    Rectal cancer Neg Hx     SOCIAL HISTORY: Social History   Socioeconomic History   Marital status: Married    Spouse name: Not on file   Number of children: 3   Years of education: GED   Highest education level: GED or equivalent  Occupational History   Occupation: Works for [employer] (state).  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[employer]    Comment: works for [employer] (state)  Tobacco Use   Smoking status: Former    Current packs/day: 0.00    Types: Cigarettes    Quit date: 06/26/1985    Years since quitting: 38.2   Smokeless tobacco: Never   Tobacco comments:    Former smoker, quit 1987  Vaping Use   Vaping status: Never Used  Substance and Sexual Activity   Alcohol use: No    Comment:     Drug use: No   Sexual activity: Not on file  Other Topics Concern   Not on file  Social History Narrative   Lives with wife in a one story   Right handed    One story home   3 children         Social Drivers of Health   Financial Resource Strain: High Risk (08/21/2023)   Overall Financial Resource Strain (CARDIA)    Difficulty of Paying Living Expenses: Very hard  Food Insecurity: No Food Insecurity (08/21/2023)   Hunger Vital Sign    Worried About Running Out of Food in the Last Year: Never true    Ran Out of Food in the Last Year: Never true  Transportation Needs: No  Transportation Needs (08/21/2023)   PRAPARE - Administrator, Civil Service (Medical): No    Lack of Transportation (Non-Medical): No  Physical Activity: Sufficiently Active (08/21/2023)   Exercise Vital Sign    Days of Exercise per Week: 5 days    Minutes of Exercise per Session: 60 min  Stress: Stress Concern Present (08/21/2023)   [employer] of Occupational Health - Occupational  Stress Questionnaire    Feeling of Stress : To some extent  Social Connections: Socially Integrated (08/21/2023)   Social Connection and Isolation Panel [NHANES]    Frequency of Communication with Friends and Family: More than three times a week    Frequency of Social Gatherings with Friends and Family: Once a week    Attends Religious Services: More than 4 times per year    Active Member of [employer] or Organizations: Yes    Attends Engineer, structural: More than 4 times per year    Marital Status: Married    MEDICATIONS:  Current Outpatient Medications  Medication Sig Dispense Refill   acetaminophen (TYLENOL) 650 MG CR tablet Take 650 mg by mouth every 8 (eight) hours as needed for pain.     azelastine (ASTELIN) 0.1 % nasal spray Place 2 sprays into both nostrils 2 (two) times daily. Use in each nostril as directed 30 mL 5   BD VEO INSULIN SYRINGE U/F 31G X 15/64" 0.5 ML MISC USE 1 SYRINGE AS DIRECTED 3 TO 4 TIMES DAILY 100 each 0   Blood Glucose Monitoring Suppl (ONETOUCH VERIO REFLECT) w/Device KIT 1 1 kit 0   Continuous Glucose Sensor (DEXCOM G7 SENSOR) MISC CHANGE EVERY 10 DAYS 9 each 3   Dapagliflozin Pro-metFORMIN ER (XIGDUO XR) 10-998 MG TB24 Take 1 tablet by mouth 2 (two) times daily. 180 tablet 1   dicyclomine (BENTYL) 10 MG capsule TAKE 1 CAPSULE(10 MG) BY MOUTH FOUR TIMES DAILY BEFORE MEALS AND AT BEDTIME 120 capsule 11   ELIQUIS 5 MG TABS tablet TAKE 1 TABLET(5 MG) BY MOUTH TWICE DAILY 60 tablet 5   fluticasone (FLONASE) 50 MCG/ACT nasal spray Place 2 sprays into both  nostrils daily as needed for allergies or rhinitis. 16 g 5   Glucagon (BAQSIMI ONE PACK) 3 MG/DOSE POWD To be sprayed in nostril as needed for severe hypoglycemia 1 each 1   glucose blood (ONETOUCH VERIO) test strip USE AS DIRECTED TO CHECK BLOOD SUGAR 4 TIMES DAILY 125 each 2   irbesartan (AVAPRO) 150 MG tablet Take 1 tablet by mouth once daily 90 tablet 2   Lancets (ONETOUCH DELICA PLUS LANCET33G) MISC Use to check blood sugar 4 times per day 200 each 2   loratadine (CLARITIN) 10 MG tablet Take 10 mg by mouth daily.     Multiple Vitamins-Minerals (MULTIVITAMIN WITH MINERALS) tablet Take 1 tablet by mouth daily.     nitroGLYCERIN (NITROSTAT) 0.4 MG SL tablet DISSOLVE ONE TABLET UNDER THE TONGUE EVERY 5 MINUTES AS NEEDED FOR CHEST PAIN.  DO NOT EXCEED A TOTAL OF 3 DOSES IN 15 MINUTES 25 tablet 0   NOVOLOG FLEXPEN 100 UNIT/ML FlexPen INJECT 10 TO 15 UNITS SUBCUTANEOUSLY THREE TIMES DAILY BEFORE MEAL(S) 30 mL 1   omega-3 acid ethyl esters (LOVAZA) 1 g capsule Take by mouth.     ondansetron (ZOFRAN-ODT) 4 MG disintegrating tablet Take 1 tablet (4 mg total) by mouth every 8 (eight) hours as needed for nausea or vomiting. 20 tablet 0   pantoprazole (PROTONIX) 40 MG tablet TAKE 1 TABLET BY MOUTH ONCE DAILY 30  MINUTES  PRIOR  TO  EATING 90 tablet 0   Polyethyl Glyc-Propyl Glyc PF 0.4-0.3 % SOLN Apply 2 drops to eye 3 (three) times daily as needed (for dry eyes).     pregabalin (LYRICA) 100 MG capsule Take 1 capsule (100 mg total) by mouth 3 (three) times daily. 90 capsule 3   Probiotic Product (PROBIOTIC DAILY PO)  Take 1 capsule by mouth daily.     rosuvastatin (CRESTOR) 40 MG tablet Take 1 tablet by mouth once daily 90 tablet 0   Semaglutide, 2 MG/DOSE, 8 MG/3ML SOPN Inject 2 mg as directed once a week. 9 mL 3   silodosin (RAPAFLO) 8 MG CAPS capsule Take 8 mg by mouth daily with breakfast.     TRESIBA FLEXTOUCH 100 UNIT/ML FlexTouch Pen INJECT 46 UNITS SUBCUTANEOUSLY ONCE DAILY 30 mL 4   No current  facility-administered medications for this visit.    PHYSICAL EXAM: Vitals:   09/20/23 1409  BP: 122/70  Pulse: 69  SpO2: 96%  Weight: 224 lb 9.6 oz (101.9 kg)  Height: 5\' 11"  (1.803 m)    Body mass index is 31.33 kg/m.  Wt Readings from Last 3 Encounters:  09/20/23 224 lb 9.6 oz (101.9 kg)  08/22/23 222 lb (100.7 kg)  04/12/23 224 lb 9.6 oz (101.9 kg)    General: Well developed, well nourished male in no apparent distress.  HEENT: AT/[place], no external lesions.  Eyes: Conjunctiva clear and no icterus. Neck: Neck supple  Lungs: Respirations not labored Neurologic: Alert, oriented, normal speech Extremities / Skin: Dry.   Psychiatric: Does not appear depressed or anxious  Diabetic Foot Exam - Simple   No data filed    LABS Reviewed Lab Results  Component Value Date   HGBA1C 7.3 (H) 08/06/2023   HGBA1C 6.8 (H) 04/09/2023   HGBA1C 7.1 (H) 02/05/2023   No results found for: "FRUCTOSAMINE" Lab Results  Component Value Date   CHOL 67 (L) 02/22/2023   HDL 31 (L) 02/22/2023   LDLCALC 17 02/22/2023   LDLDIRECT 62.0 03/29/2021   TRIG 98 02/22/2023   CHOLHDL 2.2 02/22/2023   Lab Results  Component Value Date   MICRALBCREAT 0.8 04/12/2023   MICRALBCREAT 0.7 03/20/2022   Lab Results  Component Value Date   CREATININE 0.86 08/06/2023   Lab Results  Component Value Date   GFR 93.01 04/09/2023    ASSESSMENT / PLAN  1. Type 2 diabetes mellitus with diabetic neuropathy, with long-term current use of insulin (HCC)     Diabetes Mellitus type 2, complicated by diabetes neuropathy.  - Diabetic status / severity: controlled.   Lab Results  Component Value Date   HGBA1C 7.3 (H) 08/06/2023    - Hemoglobin A1c goal : <7%  - Medications: see below Continue Tresiba 46 units daily.  Continue Ozempic to 2mg  weekly.  Xigduo XR 10/998 1 tab twice a day.  Novolog 15-20 units 2-3 times a day, with meals.  Okay to adjust based on meal, blood sugar and increase physical  activity/work.  - Home glucose testing: CGM and check as needed.  Dexcom G7. - Discussed/ Gave Hypoglycemia treatment plan.  # Consult : not required at this time.   # Annual urine for microalbuminuria/ creatinine ratio, no microalbuminuria currently, continue ACE/ARB / irbesartan.  Last  Lab Results  Component Value Date   MICRALBCREAT 0.8 04/12/2023    # Foot check nightly / neuropathy, continue lyrica.   # Annual dilated diabetic eye exams.   - Diet: Eat reasonable portion sizes to promote a healthy weight - Life style / activity / exercise: discussed.   2. Blood pressure  -  BP Readings from Last 1 Encounters:  09/20/23 122/70    - Control is in target.  - No change in current plans.  3. Lipid status / Hyperlipidemia - Last  Lab Results  Component Value Date  LDLCALC 17 02/22/2023   - Continue rosuvastatin 40mg  daily. Managed by cardiology.    Diagnoses and all orders for this visit:  Type 2 diabetes mellitus with diabetic neuropathy, with long-term current use of insulin (HCC)    DISPOSITION Follow up in clinic in 4 months suggested.   All questions answered and patient verbalized understanding of the plan.  Danny Mikyla Schachter, MD [facility] Endocrinology [facility] [address], [address] [place], [place] [address] Phone # [phone]  At least part of this note was generated using voice recognition software. Inadvertent word errors may have occurred, which were not recognized during the proofreading process.

## 2023-09-20 NOTE — Progress Notes (Signed)
 Cardiology Office Note:  .   Date:  10/04/2023  ID:  Danny [name], DOB [birth year], MRN [id number] PCP: Wanda Plump, MD  [facility] Providers Cardiologist:  Olga Millers, MD Electrophysiologist:  Hillis Range, MD (Inactive)   History of Present Illness: .   Danny [name] is a 63 y.o. male with a past medical history of hypertension, paroxysmal atrial fibrillation, elevated coronary calcium score, type 2 diabetes on insulin, hyperlipidemia.  Patient is followed by Dr. Jens Som, presents today for evaluation of lower extremity swelling.  Per chart review, patient previously had atrial fibrillation ablation in 02/2014 by Dr. Johney Frame.  Later, cardiac monitor in 07/2021 showed no atrial fibrillation, rare PACs and PVCs.  Coronary CTA in 07/2021 showed a coronary calcium score of 2233 (99th percentile). Study was sent for [facility] which was negative. Echocardiogram 07/2021 showed EF 60-65%, no regional wall motion abnormalities, normal diastolic parameters, normal RV function. Carotid ultrasounds in 07/2021 showed no evidence of stenosis in bilateral ICAs.   Patient underwent LHC on 08/30/21 that showed multivessel coronary calcifications with mild luminal irregularities. There was 50% focal stenosis in the mid LAD, 20% stenosis in the ostial ramus intermediate, 5-10% stenosis in the proximal circumflex, 20% stenosis in prox RCA, and 30-40% mid RCA.  Treated medically.   The patient, with a history of atrial fibrillation (AFib) status post ablation, presents with lower extremity swelling that has been ongoing for a couple of months. The swelling is worse with prolonged standing and improves with rest and elevation. He denies shortness of breath but reports a sensation of abdominal bloating. He has not experienced any recent chest pain. He occasionally notices a fluttering sensation in his chest that lasts less than a minute. He monitors his heart rate with an Apple Watch and has not noticed any  significant abnormalities.  The patient also reports chronic leg pain and cramping, which he attributes to neuropathy. He has not tried any diuretics for his swelling. He reports dizziness upon standing and a sensation of falling backwards. He has not noticed any unexplained bleeding while on his blood thinner. He admits to a high salt intake and is willing to try a low salt diet.  ROS: Per HPI   Studies Reviewed: .    Cardiac Studies & Procedures   ______________________________________________________________________________________________ CARDIAC CATHETERIZATION  CARDIAC CATHETERIZATION 08/30/2021  Narrative   Prox RCA lesion is 20% stenosed.   Prox RCA to Mid RCA lesion is 40% stenosed.   Prox Cx lesion is 5% stenosed.   Mid LAD lesion is 50% stenosed.   Ramus lesion is 20% stenosed.   LV end diastolic pressure is mildly elevated.   The left ventricular ejection fraction is 55-65% by visual estimate.  Multi-vessel coronary calcification and mild luminal irregularities. The LAD has focal 50% mid stenosis. There is mild 20% ostial stenosis of a ramus intermediate vessel. The proximal circumflex has mild stenosis of 5 to 10%. The RCA is a large dominant vessel with mild luminal irregularity with 20% proximal stenosis and 30 to 40% mid stenosis..  Normal LV function with EF estimated 55 to 65% without wall motion abnormalities.  LVEDP 19 mmHg.  RECOMMENDATION: Medical therapy for nonobstructive CAD.  Patient is not on any anti-ischemic therapy.  Consider adding low-dose beta-blocker therapy if heart rate and blood pressure allow.  Patient can resume Eliquis tomorrow.  If no contraindications with underlying CAD can also add low-dose aspirin 81 mg.  Aggressive lipid-lowering therapy with target LDL in the  60s or below.  Findings Coronary Findings Diagnostic  Dominance: Right  Left Anterior Descending The vessel exhibits minimal luminal irregularities. Mid LAD lesion is 50%  stenosed.  First Diagonal Branch The vessel exhibits minimal luminal irregularities.  Ramus Intermedius Ramus lesion is 20% stenosed.  Left Circumflex Prox Cx lesion is 5% stenosed.  Right Coronary Artery The vessel exhibits minimal luminal irregularities. Prox RCA lesion is 20% stenosed. Prox RCA to Mid RCA lesion is 40% stenosed.  Intervention  No interventions have been documented.     ECHOCARDIOGRAM  ECHOCARDIOGRAM COMPLETE 08/18/2021  Narrative ECHOCARDIOGRAM REPORT    Patient Name:   Danny [name] Date of Exam: 08/18/2021 Medical Rec #:  [id number]        Height:       71.0 in Accession #:    [id number]       Weight:       227.6 lb Date of Birth:  09/29/1960        BSA:          2.228 m Patient Age:    60 years         BP:           154/84 mmHg Patient Gender: M                HR:           71 bpm. Exam Location:  [address]  Procedure: 2D Echo, Cardiac Doppler and Color Doppler  Indications:    R07.9 Chest Pain  History:        Patient has prior history of Echocardiogram examinations, most recent 03/25/2014. Arrythmias:Atrial Fibrillation, Signs/Symptoms:Syncope; Risk Factors:Hypertension, Diabetes and HLD. Hx of COVID 19.  Sonographer:    Clearence Ped RCS Referring Phys: [id number] RENEE LYNN URSUY  IMPRESSIONS   1. Left ventricular ejection fraction, by estimation, is 60 to 65%. The left ventricle has normal function. The left ventricle has no regional wall motion abnormalities. Left ventricular diastolic parameters were normal. 2. Right ventricular systolic function is normal. The right ventricular size is normal. 3. The mitral valve is normal in structure. No evidence of mitral valve regurgitation. No evidence of mitral stenosis. 4. The aortic valve is normal in structure. Aortic valve regurgitation is not visualized. No aortic stenosis is present. 5. The inferior vena cava is normal in size with greater than 50% respiratory variability, suggesting  right atrial pressure of 3 mmHg.  FINDINGS Left Ventricle: Left ventricular ejection fraction, by estimation, is 60 to 65%. The left ventricle has normal function. The left ventricle has no regional wall motion abnormalities. The left ventricular internal cavity size was normal in size. There is no left ventricular hypertrophy. Left ventricular diastolic parameters were normal.  Right Ventricle: The right ventricular size is normal. No increase in right ventricular wall thickness. Right ventricular systolic function is normal.  Left Atrium: Left atrial size was normal in size.  Right Atrium: Right atrial size was normal in size.  Pericardium: There is no evidence of pericardial effusion. Presence of epicardial fat layer.  Mitral Valve: The mitral valve is normal in structure. No evidence of mitral valve regurgitation. No evidence of mitral valve stenosis.  Tricuspid Valve: The tricuspid valve is normal in structure. Tricuspid valve regurgitation is not demonstrated. No evidence of tricuspid stenosis.  Aortic Valve: The aortic valve is normal in structure. Aortic valve regurgitation is not visualized. No aortic stenosis is present.  Pulmonic Valve: The pulmonic valve was normal in structure. Pulmonic valve  regurgitation is not visualized. No evidence of pulmonic stenosis.  Aorta: The aortic root is normal in size and structure.  Venous: The inferior vena cava is normal in size with greater than 50% respiratory variability, suggesting right atrial pressure of 3 mmHg.  IAS/Shunts: No atrial level shunt detected by color flow Doppler.   LEFT VENTRICLE PLAX 2D LVIDd:         5.00 cm   Diastology LVIDs:         3.50 cm   LV e' medial:    7.29 cm/s LV PW:         0.90 cm   LV E/e' medial:  12.6 LV IVS:        0.90 cm   LV e' lateral:   12.70 cm/s LVOT diam:     1.90 cm   LV E/e' lateral: 7.2 LV SV:         69 LV SV Index:   31 LVOT Area:     2.84 cm   RIGHT VENTRICLE RV Basal  diam:  3.70 cm RV S prime:     16.50 cm/s TAPSE (M-mode): 2.6 cm  LEFT ATRIUM             Index        RIGHT ATRIUM           Index LA diam:        4.10 cm 1.84 cm/m   RA Area:     11.40 cm LA Vol (A2C):   52.1 ml 23.39 ml/m  RA Volume:   22.40 ml  10.06 ml/m LA Vol (A4C):   39.5 ml 17.73 ml/m LA Biplane Vol: 45.9 ml 20.60 ml/m AORTIC VALVE LVOT Vmax:   125.00 cm/s LVOT Vmean:  79.000 cm/s LVOT VTI:    0.242 m  AORTA Ao Root diam: 3.10 cm Ao Asc diam:  3.50 cm  MITRAL VALVE MV Area (PHT):             SHUNTS MV Decel Time:             Systemic VTI:  0.24 m MV E velocity: 91.70 cm/s  Systemic Diam: 1.90 cm MV A velocity: 83.80 cm/s MV E/A ratio:  1.09  Kardie Tobb DO Electronically signed by Thomasene Ripple DO Signature Date/Time: 08/18/2021/5:39:45 PM    Final    MONITORS  CARDIAC EVENT MONITOR 08/02/2021  Narrative HR 51 - 148 bpm, average 75 bpm. No atrial fibrillation detected. Rare ventricular ectopy. Occasional (2%) supraventricular ectopy. Noise limits some rhythm strip interpretations.  Sheria Lang T. Lalla Brothers, MD, [facility], [facility] Cardiac Electrophysiology   CT SCANS  CT CORONARY FRACTIONAL FLOW RESERVE DATA PREP 08/15/2021  Narrative CLINICAL DATA:  CAD  EXAM: FFR CT  TECHNIQUE: The best systolic and diastolic phases of the patients gated cardiac CTA sent to [facility] for hemodynamic analysis  FINDINGS: FFR CT negative except for borderline in distal LAD  LAD: 0.92 proximal, 0.92 mid 0.77 distally  RCA: 0.98 proximally, 0.88 mid, 0.83 distally  LCX: 0.93  IM: 0.86  Diagonal: 0.87  IMPRESSION: Negative FFR CT Borderline distal LAD 0.77  Charlton Haws   Electronically Signed By: Charlton Haws M.D. On: 08/15/2021 17:51   CT SCANS  CT CORONARY MORPH W/CTA COR W/SCORE 08/15/2021  Addendum 08/15/2021  5:49 PM ADDENDUM REPORT: 08/15/2021 17:47  CLINICAL DATA:  Chest pain  EXAM: Cardiac CTA  MEDICATIONS: Sub lingual nitro. 4 mg and  lopressor 100mg   TECHNIQUE: The patient was scanned on a  [employer] 192 scanner. Gantry rotation speed was 250 msecs. Collimation was. 6 mm . A 120 kV prospective scan was triggered in the ascending thoracic aorta at 140 HU's with full mA between 30-70% of the R-R interval . Average HR during the scan was 47 bpm. The 3D data set was interpreted on a dedicated work station using MPR, MIP and VRT modes. A total of 80 cc of contrast was used.  FINDINGS: Non-cardiac: See separate report from [facility]. No significant findings on limited lung and soft tissue windows.  Calcium score: Severe 3 vessel coronary calcium  LM: 0  LAD 752  RCA 1347  LCX 134  Total 2233 which is 99 th percentile for age/sex  Coronary Arteries: Right dominant with no anomalies  LM: Normal  LAD: 40-59% calcified plaque proximally, 1-24% calcified plaque mid and distally  IM: 25-49% calcified plaque ostial/proximally 1-24% mid vessel  D2: 25-49% ostial/proximal calcified plaque  Circumflex: 25-49% calcified ostial/proximal calcific disease  OM1: Normal  OM2: Normal  RCA: 40-59% proximal, mid and distal calcific stenosis  PDA: 25-49% calcified plaque  PLA: 1-24% calcified plaque  IMPRESSION: 1. Severe 3 vessel coronary calcium score 2233, 99 th percentile for age/sex  2.  CAD RADS 3 CAD see description above study sent for FFR CT  3.  Normal ascending thoracic aorta 3.2 cm  Charlton Haws   Electronically Signed By: Charlton Haws M.D. On: 08/15/2021 17:47  Narrative EXAM: OVER-READ INTERPRETATION  CT CHEST  The following report is an over-read performed by radiologist Dr. Charlett Nose of [facility] on 08/15/2021. This over-read does not include interpretation of cardiac or coronary anatomy or pathology. The coronary calcium score/coronary CTA interpretation by the cardiologist is attached.  COMPARISON:  CT abdomen and pelvis  10/31/2016  FINDINGS: Vascular: Heart is normal size.  Aorta normal caliber.  Mediastinum/Nodes: No adenopathy.  Lungs/Pleura: No confluent opacities or effusions. 5 mm right middle lobe nodule on image 41 of series 12. This is stable dating back to study from 10/31/2016 compatible with benign nodule.  Upper Abdomen: No acute findings  Musculoskeletal: Chest wall soft tissues are unremarkable. No acute bony abnormality.  IMPRESSION: No acute extra cardiac abnormality.  5 mm right middle lobe nodule stable since 2018 compatible with benign nodule.  Electronically Signed: By: Charlett Nose M.D. On: 08/15/2021 16:30     ______________________________________________________________________________________________       Risk Assessment/Calculations:    CHA2DS2-VASc Score = 3   This indicates a 3.2% annual risk of stroke. The patient's score is based upon: CHF History: 0 HTN History: 1 Diabetes History: 1 Stroke History: 0 Vascular Disease History: 1 Age Score: 0 Gender Score: 0        Physical Exam:   VS:  BP (!) 124/54 (BP Location: Left Arm, Patient Position: Sitting, Cuff Size: Large)   Pulse 90   Ht 5\' 11"  (1.803 m)   Wt 223 lb (101.2 kg)   SpO2 94%   BMI 31.10 kg/m    Wt Readings from Last 3 Encounters:  10/04/23 223 lb (101.2 kg)  09/20/23 224 lb 9.6 oz (101.9 kg)  08/22/23 222 lb (100.7 kg)    GEN: Well nourished, well developed in no acute distress. Sitting comfortably on the exam table  NECK: No JVD  CARDIAC:  RRR, no murmurs, rubs, gallops. Radial pulses 2+ bilaterally  RESPIRATORY:  Clear to auscultation without rales, wheezing or rhonchi. Normal WOB on room air  ABDOMEN: Soft, non-tender, non-distended EXTREMITIES:  Trace edema in BLE; No deformity   ASSESSMENT AND PLAN: .    Lower extremity Edema  - Echocardiogram 08/18/21 showed EF 60-65%, no regional wall motion abnormalities, normal RV function, no significant valvular abnormalities  -  Today, patient reports that for the past few months, he has been having bilateral lower extremity swelling. Swelling is most significant after spending a lot of time on his feet. Improves with elevation. Denies associated shortness of breath, orthopnea, DOE  - On exam today, patient has trace edema in BLE. Reports that even on days that he does not spent a lot of time on his feet, he has swelling  - He admits to eating a lot of salt in his diet. Encouraged him to follow a low salt diet and provided him with low sodium diet information on AVS  - Start hydralazine 12.5 mg daily. K 5.0 and creatinine 0.86 in 07/2023  - Ordered BMP to be collected in 2 weeks   PAF  - S/p ablation in 2015. Has not had known recurrence since then. Has occasional episodes of palpitations that are very brief (last less than 1 minute). Wears an apple watch and has not had any rhythm strips detecting afib   - Continue eliquis 5 mg BID. Denies bleeding on eliquis   CAD  - Cardiac catheterization from 08/2021 showed multi-vessel coronary calcifications with only mild luminal irregularities  - No chest pain or Doe  - Continue crestor 40 mg daily  - Not on ASA due to eliquis use   HTN - BP overall well controlled at home. He does have some dizziness if he stands up to quickly. His wife is an [employer] fundraiser and monitors his BP at home  - With occasional orthostatic dizziness, hesitant to drop BP  - Start hydralazine 12.5 mg daily for lower extremity edema - Decrease irbesartan to 75 mg daily  - Instructed patient to keep BP log for 2 weeks and return it via mychart  - Discussed low sodium diets as above  - K 5.0 and creatinine 0.86 in 07/2023. BMP in 2 weeks as above   HLD - Lipid panel from 01/2023 showed LDL 17, HDL 31, triglycerides 98, total cholesterol 67  - Continue crestor 40 mg daily   Type 2 DM  - Followed by PCP. A1c 7.3 in 07/2023 - Currently on xigduo XR   Dispo: Follow up in 4 months with Dr. Jens Som    Signed, Jonita Albee, PA-C

## 2023-09-21 ENCOUNTER — Other Ambulatory Visit: Payer: Self-pay | Admitting: Endocrinology

## 2023-09-21 ENCOUNTER — Encounter: Payer: Self-pay | Admitting: Endocrinology

## 2023-10-04 ENCOUNTER — Other Ambulatory Visit: Payer: Self-pay

## 2023-10-04 ENCOUNTER — Encounter: Payer: Self-pay | Admitting: Cardiology

## 2023-10-04 ENCOUNTER — Ambulatory Visit: Attending: Cardiology | Admitting: Cardiology

## 2023-10-04 VITALS — BP 124/54 | HR 90 | Ht 71.0 in | Wt 223.0 lb

## 2023-10-04 DIAGNOSIS — Z79899 Other long term (current) drug therapy: Secondary | ICD-10-CM

## 2023-10-04 DIAGNOSIS — R600 Localized edema: Secondary | ICD-10-CM

## 2023-10-04 DIAGNOSIS — E785 Hyperlipidemia, unspecified: Secondary | ICD-10-CM

## 2023-10-04 DIAGNOSIS — I2510 Atherosclerotic heart disease of native coronary artery without angina pectoris: Secondary | ICD-10-CM

## 2023-10-04 DIAGNOSIS — I480 Paroxysmal atrial fibrillation: Secondary | ICD-10-CM | POA: Diagnosis not present

## 2023-10-04 DIAGNOSIS — I10 Essential (primary) hypertension: Secondary | ICD-10-CM

## 2023-10-04 MED ORDER — IRBESARTAN 75 MG PO TABS: 75.0000 mg | ORAL_TABLET | Freq: Every day | ORAL | 5 refills | Status: DC

## 2023-10-04 MED ORDER — HYDROCHLOROTHIAZIDE 12.5 MG PO CAPS: 12.5000 mg | ORAL_CAPSULE | Freq: Every day | ORAL | 3 refills | Status: DC

## 2023-10-04 NOTE — Patient Instructions (Addendum)
 Medication Instructions:  DECREASE Irbesartan to 75mg  Take 1 tablet once a day  START Hydrochlorothiazide (hctz) 12.5mg  Take 1 tablet once a day (tablet comes in 25mg  you will need to cut in half)  *If you need a refill on your cardiac medications before your next appointment, please call your pharmacy*  Lab Work: 2 weeks BMET  If you have labs (blood work) drawn today and your tests are completely normal, you will receive your results only by: MyChart Message (if you have MyChart) OR A paper copy in the mail If you have any lab test that is abnormal or we need to change your treatment, we will call you to review the results.  Testing/Procedures: NONE ORDERED  Follow-Up: At [facility], you and your health needs are our priority.  As part of our continuing mission to provide you with exceptional heart care, our providers are all part of one team.  This team includes your primary Cardiologist (physician) and Advanced Practice Providers or APPs (Physician Assistants and Nurse Practitioners) who all work together to provide you with the care you need, when you need it.  Your next appointment:   4 month(s)  Provider:   Olga Millers, MD     We recommend signing up for the patient portal called "MyChart".  Sign up information is provided on this After Visit Summary.  MyChart is used to connect with patients for Virtual Visits (Telemedicine).  Patients are able to view lab/test results, encounter notes, upcoming appointments, etc.  Non-urgent messages can be sent to your provider as well.   To learn more about what you can do with MyChart, go to [email or web address].   Other Instructions Check your blood pressure daily for 2 weeks, then contact the office with your readings.  Contact the office either by phone or MyChart with your readings.  Make sure to check your blood pressure 2 hours after taking your medications.   AVOID these things for 30 minutes before checking your  blood pressure: No Drinking caffeine. No Drinking alcohol. No Eating. No Smoking. No Exercising.  Five minutes before checking your blood pressure: Pee. Sit in a dining chair. Avoid sitting in a soft couch or armchair. Be quiet. Do not talk.       1st Floor: - Lobby - Registration  - Pharmacy  - Lab - Cafe  2nd Floor: - PV Lab - Diagnostic Testing (echo, CT, nuclear med)  3rd Floor: - Vacant  4th Floor: - TCTS (cardiothoracic surgery) - AFib Clinic - Structural Heart Clinic - Vascular Surgery  - Vascular Ultrasound  5th Floor: - HeartCare Cardiology (general and EP) - Clinical Pharmacy for coumadin, hypertension, lipid, weight-loss medications, and med management appointments    Valet parking services will be available as well.

## 2023-10-12 ENCOUNTER — Other Ambulatory Visit: Payer: Self-pay | Admitting: Cardiology

## 2023-10-12 DIAGNOSIS — I480 Paroxysmal atrial fibrillation: Secondary | ICD-10-CM

## 2023-10-12 NOTE — Telephone Encounter (Signed)
 Prescription refill request for Eliquis  received. Indication: a fib Last office visit: 10/04/23 Scr: 0.86 epic 08/06/23 Age: 63 Weight: 101kg

## 2023-10-16 ENCOUNTER — Encounter: Payer: Self-pay | Admitting: Gastroenterology

## 2023-10-16 ENCOUNTER — Ambulatory Visit: Admitting: Gastroenterology

## 2023-10-16 VITALS — BP 104/60 | HR 72 | Ht 70.0 in | Wt 223.5 lb

## 2023-10-16 DIAGNOSIS — R140 Abdominal distension (gaseous): Secondary | ICD-10-CM | POA: Diagnosis not present

## 2023-10-16 DIAGNOSIS — K5790 Diverticulosis of intestine, part unspecified, without perforation or abscess without bleeding: Secondary | ICD-10-CM

## 2023-10-16 DIAGNOSIS — R143 Flatulence: Secondary | ICD-10-CM

## 2023-10-16 DIAGNOSIS — K588 Other irritable bowel syndrome: Secondary | ICD-10-CM | POA: Diagnosis not present

## 2023-10-16 NOTE — Progress Notes (Signed)
 Chief Complaint:follow-up abdominal pain Primary GI Doctor:Dr. Venice Gillis  HPI: Danny [name] is a 63 year old male with past medical history of hypertension, hyperlipidemia, atrial fibrillation on Eliquis , DM type II, obesity hepatic steatosis, GERD and colon polyps.   On 10/09/2022 patient last seen in GI office by Danny Simmonds, NP with complaints of left lower quadrant abdominal pain with associated constipation for the past 2 weeks. He endorses undergoing at least 5 colonoscopies in his lifetime.  His most recent colonoscopy was by a GI in [place]  08/07/2017 which was normal.  He has a history of dysphagia.  His most recent EGD was 06/15/2022 which showed a benign-appearing esophageal stenosis which was dilated, reflux esophagitis and gastritis.  Esophageal biopsies were negative for Barrett's esophagus.  Gastric biopsies were consistent with reactive gastropathy with chronic gastritis and focal intestinal metaplasia without evidence of H. pylori or dysplasia.  He denies having any further food dysphagia but chronically has pill dysphagia.  No heartburn or upper abdominal pain.  He remains on Pantoprazole  40 mg daily.   GI Procedures: On 12/25/2022 patient last seen by Dr. Venice Gillis in [facility] for colonoscopy with history of polyps. 12/25/2022 colonoscopy Impression: - Minimal sigmoid diverticulosis. - Non- bleeding internal hemorrhoids. - The examined portion of the ileum was normal. - The examination was otherwise normal on direct and retroflexion views.  No specimens collected. Repeat colonoscopy in 10 years.  Note 2-day prep for future colonoscopies.  MiraLAX p.o. daily.  EGD 06/15/2022 by Dr. Venice Gillis: - Benign-appearing esophageal stenosis. Dilated.  - Z-line irregular, 40 cm from the incisors. Biopsied.  - Gastritis. Biopsied.  - Normal examined duodenum.  - Biopsies were taken with a cold forceps for evaluation of eosinophilic esophagitis 1. Surgical [P], gastric REACTIVE GASTROPATHY WITH  CHRONIC GASTRITIS AND FOCAL INTESTINAL METAPLASIA HELICOBACTER STAIN NEGATIVE ([facility], ADEQUATE CONTROL) NEGATIVE FOR DYSPLASIA AND CARCINOMA 2. Surgical [P], distal esophagus REFLUX ESOPHAGITIS (6 EOS/HIGH POWER FIELD) SCANT CARDIA TYPE GASTRIC MUCOSA AND FOVEOLAR EPITHELIUM SHOWING MILD CHRONIC GASTRITIS NEGATIVE FOR INTESTINAL METAPLASIA, DYSPLASIA AND CARCINOMA 3. Surgical [P], proximal and mid esophagus REACTIVE SQUAMOUS MUCOSA NEGATIVE FOR GLANDULAR EPITHELIUM, EOSINOPHILS, DYSPLASIA AND CARCINOMA   Colonoscopy 08/07/2017: Normal colonoscopy  Internal hemorrhoids Repeat colonoscopy in 5 years   Colonoscopy 11/25/2012 by Dr. Venice Gillis: Mild sigmoid diverticulosis Small internal hemorrhoids the most likely etiology for bright red blood per rectum Otherwise normal colonoscopy Due to prior history of colon polyps, recommend repeating colonoscopy in 5 years, earlier if he starts having any new problems    Interval History    Patient presents today with main complaint of intermittent lower abdominal pain for several months and altered bowel habits with gas and bloat.Danny [name] He has daily bowel movement described as mushy stool.  Patient reports he is not sure if he completely empties out or not.  No blood.  Patient has Miralax at home he has tried in the past without improvement in symptoms. He has a lot of gas and bloat as well.  The gas seems to be worse with eating certain types of beans. Patient taking bentyl  10 mg three times daily which helps some.  Patient denies external stress.    Patient has history of GERD and currently taking Pantoprazole  40 mg po daily.  Patient denies dysphagia. Patient denies nausea, vomiting, or weight loss.  Wt Readings from Last 3 Encounters:  10/16/23 223 lb 8 oz (101.4 kg)  10/04/23 223 lb (101.2 kg)  09/20/23 224 lb 9.6 oz (101.9 kg)  Past Medical History:  Diagnosis Date   Allergic rhinitis    Benign prostatic hypertrophy    s/p TUNA procedure aprx 2006,    Carpal tunnel syndrome 06/17/2009   Diabetes mellitus    Type II dx 1999   DM type 2 with diabetic peripheral neuropathy (HCC) 10/15/2014   ED (erectile dysfunction)    After TUNA   Elevated LFTs 2010   (-) chronic hep  panel   History of cardiovascular stress test 8/11   Negative   Hyperlipidemia    HYPERSOMNIA UNSPECIFIED 12/11/2007   Hypertension    Obesity    PAF (paroxysmal atrial fibrillation) (HCC) 12/21/2009   a. Failed Multaq. b. Prev on flecainide . c. s/p afib ablation 2015.    Past Surgical History:  Procedure Laterality Date   ABLATION  03/24/2014   PVI by Dr Nunzio Belch   ATRIAL FIBRILLATION ABLATION N/A 03/24/2014   Procedure: ATRIAL FIBRILLATION ABLATION;  Surgeon: Ellaree Gunther, MD;  Location: [facility] CATH LAB;  Service: Cardiovascular;  Laterality: N/A;   COLONOSCOPY  11/25/2012   Mild sigmoid diverticulosis, small internal hemorrhoids, otherwise normal colonoscopy   CYSTOSCOPY  04/26/2008   neg  @ urology, was rx Flomax    LEFT HEART CATH AND CORONARY ANGIOGRAPHY N/A 08/30/2021   Procedure: LEFT HEART CATH AND CORONARY ANGIOGRAPHY;  Surgeon: Millicent Ally, MD;  Location: [facility] INVASIVE CV LAB;  Service: Cardiovascular;  Laterality: N/A;   PROSTATE SURGERY     TUNA   TEE WITHOUT CARDIOVERSION N/A 03/23/2014   Procedure: TRANSESOPHAGEAL ECHOCARDIOGRAM (TEE);  Surgeon: Elmyra Haggard, MD;  Location: [facility] ENDOSCOPY;  Service: Cardiovascular;  Laterality: N/A;   VASECTOMY     several years ago    Current Outpatient Medications  Medication Sig Dispense Refill   acetaminophen  (TYLENOL ) 650 MG CR tablet Take 650 mg by mouth every 8 (eight) hours as needed for pain.     azelastine  (ASTELIN ) 0.1 % nasal spray Place 2 sprays into both nostrils 2 (two) times daily. Use in each nostril as directed 30 mL 5   BD VEO INSULIN  SYRINGE U/F 31G X 15/64" 0.5 ML MISC USE 1 SYRINGE AS DIRECTED 3 TO 4 TIMES DAILY 100 each 0   Blood Glucose Monitoring Suppl (ONETOUCH VERIO REFLECT) w/Device KIT 1  1 kit 0   Continuous Glucose Sensor (DEXCOM G7 SENSOR) MISC CHANGE EVERY 10 DAYS 9 each 3   Dapagliflozin  Pro-metFORMIN  ER (XIGDUO  XR) 10-998 MG TB24 Take 1 tablet by mouth 2 (two) times daily. 180 tablet 1   dicyclomine  (BENTYL ) 10 MG capsule TAKE 1 CAPSULE(10 MG) BY MOUTH FOUR TIMES DAILY BEFORE MEALS AND AT BEDTIME 120 capsule 11   ELIQUIS  5 MG TABS tablet TAKE 1 TABLET(5 MG) BY MOUTH TWICE DAILY 60 tablet 5   fluticasone  (FLONASE ) 50 MCG/ACT nasal spray Place 2 sprays into both nostrils daily as needed for allergies or rhinitis. 16 g 5   Glucagon  (BAQSIMI  ONE PACK) 3 MG/DOSE POWD To be sprayed in nostril as needed for severe hypoglycemia 1 each 1   glucose blood (ONETOUCH VERIO) test strip USE AS DIRECTED TO CHECK BLOOD SUGAR 4 TIMES DAILY 125 each 2   hydrochlorothiazide  (MICROZIDE ) 12.5 MG capsule Take 1 capsule (12.5 mg total) by mouth daily. 30 capsule 3   irbesartan  (AVAPRO ) 75 MG tablet Take 1 tablet (75 mg total) by mouth daily. 30 tablet 5   Lancets (ONETOUCH DELICA PLUS LANCET33G) MISC Use to check blood sugar 4 times per day 200 each 2  loratadine  (CLARITIN ) 10 MG tablet Take 10 mg by mouth daily.     meclizine (ANTIVERT) 25 MG tablet Take 25 mg by mouth as needed.     Multiple Vitamins-Minerals (MULTIVITAMIN WITH MINERALS) tablet Take 1 tablet by mouth daily.     NOVOLOG  FLEXPEN 100 UNIT/ML FlexPen INJECT 10 TO 12 UNITS SUBCUTANEOUSLY THREE TIMES DAILY BEFORE MEAL(S) 45 mL 3   omega-3 acid ethyl esters (LOVAZA) 1 g capsule Take by mouth.     ondansetron  (ZOFRAN -ODT) 4 MG disintegrating tablet Take 1 tablet (4 mg total) by mouth every 8 (eight) hours as needed for nausea or vomiting. 20 tablet 0   pantoprazole  (PROTONIX ) 40 MG tablet TAKE 1 TABLET BY MOUTH ONCE DAILY 30  MINUTES  PRIOR  TO  EATING 90 tablet 0   Polyethyl Glyc-Propyl Glyc PF 0.4-0.3 % SOLN Apply 2 drops to eye 3 (three) times daily as needed (for dry eyes).     pregabalin  (LYRICA ) 100 MG capsule Take 1 capsule (100  mg total) by mouth 3 (three) times daily. 90 capsule 3   Probiotic Product (PROBIOTIC DAILY PO) Take 1 capsule by mouth daily.     rosuvastatin  (CRESTOR ) 40 MG tablet Take 1 tablet by mouth once daily 90 tablet 0   Semaglutide , 2 MG/DOSE, 8 MG/3ML SOPN Inject 2 mg as directed once a week. 9 mL 3   silodosin (RAPAFLO) 8 MG CAPS capsule Take 8 mg by mouth daily with breakfast.     TRESIBA  FLEXTOUCH 100 UNIT/ML FlexTouch Pen INJECT 46 UNITS SUBCUTANEOUSLY ONCE DAILY 30 mL 4   triamcinolone cream (KENALOG) 0.1 % Apply 1 Application topically as needed.     nitroGLYCERIN  (NITROSTAT ) 0.4 MG SL tablet DISSOLVE ONE TABLET UNDER THE TONGUE EVERY 5 MINUTES AS NEEDED FOR CHEST PAIN.  DO NOT EXCEED A TOTAL OF 3 DOSES IN 15 MINUTES (Patient not taking: Reported on 10/16/2023) 25 tablet 0   No current facility-administered medications for this visit.    Allergies as of 10/16/2023   (No Known Allergies)    Family History  Problem Relation Age of Onset   Heart disease Mother    Diabetes Mother    Hypertension Mother    Congestive Heart Failure Mother    Heart attack Mother    Hypertension Father    Colon cancer Neg Hx    Prostate cancer Neg Hx    Stomach cancer Neg Hx    Rectal cancer Neg Hx     Review of Systems:    Constitutional: No weight loss, fever, chills, weakness or fatigue HEENT: Eyes: No change in vision               Ears, Nose, Throat:  No change in hearing or congestion Skin: No rash or itching Cardiovascular: No chest pain, chest pressure or palpitations   Respiratory: No SOB or cough Gastrointestinal: See HPI and otherwise negative Genitourinary: No dysuria or change in urinary frequency Neurological: No headache, dizziness or syncope Musculoskeletal: No new muscle or joint pain Hematologic: No bleeding or bruising Psychiatric: No history of depression or anxiety    Physical Exam:  Vital signs: BP 104/60 (BP Location: Left Arm, Patient Position: Sitting, Cuff Size:  Normal)   Pulse 72   Ht 5\' 10"  (1.778 m) Comment: height measured without shoes  Wt 223 lb 8 oz (101.4 kg)   BMI 32.07 kg/m   Constitutional:   Pleasant male appears to be in NAD, Well developed, Well nourished, alert and cooperative Throat: Oral  cavity and pharynx without inflammation, swelling or lesion.  Respiratory: Respirations even and unlabored. Lungs clear to auscultation bilaterally.   No wheezes, crackles, or rhonchi.  Cardiovascular: Normal S1, S2. Regular rate and rhythm. No peripheral edema, cyanosis or pallor.  Gastrointestinal:  Soft, nondistended, nontender. Diastasis recti  noted. No rebound or guarding. Hypoactive bowel sounds. No appreciable masses or hepatomegaly. Rectal:  Not performed.  Msk:  Symmetrical without gross deformities. Without edema, no deformity or joint abnormality.  Neurologic:  Alert and  oriented x4;  grossly normal neurologically.  Skin:   Dry and intact without significant lesions or rashes. Psychiatric: Oriented to person, place and time. Demonstrates good judgement and reason without abnormal affect or behaviors.  RELEVANT LABS AND IMAGING: CBC    Latest Ref Rng & Units 02/22/2023    8:32 AM 10/09/2022    3:20 PM 05/17/2022   10:34 AM  CBC  WBC 3.4 - 10.8 x10E3/uL 5.2  6.8  6.8   Hemoglobin 13.0 - 17.7 g/dL 16.1  09.6  04.5   Hematocrit 37.5 - 51.0 % 46.1  45.1  44.7   Platelets 150 - 450 x10E3/uL 251  243.0  240      CMP     Latest Ref Rng & Units 08/06/2023    8:13 AM 04/09/2023    8:48 AM 02/22/2023    8:32 AM  CMP  Glucose 65 - 99 mg/dL 409  811  92   BUN 7 - 25 mg/dL 15  9  16    Creatinine 0.70 - 1.35 mg/dL 9.14  7.82  9.56   Sodium 135 - 146 mmol/L 140  141  142   Potassium 3.5 - 5.3 mmol/L 5.0  4.4  5.3   Chloride 98 - 110 mmol/L 102  105  104   CO2 20 - 32 mmol/L 31  28  28    Calcium  8.6 - 10.3 mg/dL 9.9  9.3  9.7   Total Protein 6.0 - 8.5 g/dL   7.1   Total Bilirubin 0.0 - 1.2 mg/dL   0.3   Alkaline Phos 44 - 121 IU/L    90   AST 0 - 40 IU/L   26   ALT 0 - 44 IU/L   45      Lab Results  Component Value Date   TSH 1.77 03/29/2021     Assessment: Encounter Diagnoses  Name Primary?   Flatulence Yes   Bloating    Diverticulosis    Other irritable bowel syndrome       63 year old male patient that presents with complaints of lower abdominal pain intermittently, altered bowel habits, and gas/bloat.  Patient uses dicyclomine  which helps some.  We discussed low FODMAP diet and pamphlet handed out to avoid gas-forming foods.  Patient can use over-the-counter Gas-X as needed.  Will also order SIBO breath test and treat if positive.  His symptoms are more consistent with constipation not improved with MiraLAX therefore I will give him samples of Linzess to see if this helps regulate his bowels and empty out.  Plan: -samples of Linzess 72 mcg po daily provided -otc gas-x as needed -Order Sibo breath test -low fod map diet pamphlet hand out  -recall colonoscopy 12/2032   Thank you for the courtesy of this consult. Please call me with any questions or concerns.   Finis Hendricksen, FNP-C [facility] 10/16/2023, 2:34 PM  Cc: Ezell Hollow, MD

## 2023-10-16 NOTE — Patient Instructions (Addendum)
 Samples of Linzess 72 mcg po daily provided, take 1 tablet 30-45 minutes before first meal of the day Recommend low fod map diet OTC gas-x as needed for gas  You have been given a testing kit to check for small intestine bacterial overgrowth (SIBO) which is completed by a company named [facility]. Make sure to return your test in the mail using the return mailing label given to you along with the kit. The test order, your demographic and insurance information have all already been sent to the company. [facility] will collect an upfront charge of $99.74 for commercial insurance plans and $209.74 if you are paying cash. Make sure to discuss with [facility] PRIOR to having the test to see if they have gotten information from your insurance company as to how much your testing will cost out of pocket, if any. Please contact [facility] at phone number [phone] to get instructions regarding how to perform the test as our office is unable to give specific testing instructions.  _______________________________________________________  If your blood pressure at your visit was 140/90 or greater, please contact your primary care physician to follow up on this.  _______________________________________________________  If you are age 40 or older, your body mass index should be between 23-30. Your Body mass index is 32.07 kg/m. If this is out of the aforementioned range listed, please consider follow up with your Primary Care Provider.  If you are age 89 or younger, your body mass index should be between 19-25. Your Body mass index is 32.07 kg/m. If this is out of the aformentioned range listed, please consider follow up with your Primary Care Provider.   ________________________________________________________  The [facility] providers would like to encourage you to use MYCHART to communicate with providers for non-urgent requests or questions.  Due to long hold times on the  telephone, sending your provider a message by [facility] may be a faster and more efficient way to get a response.  Please allow 48 business hours for a response.  Please remember that this is for non-urgent requests.  _______________________________________________________ Thank you for trusting me with your gastrointestinal care!   Dyanna Glasgow, NP

## 2023-10-17 LAB — BASIC METABOLIC PANEL WITH GFR
BUN/Creatinine Ratio: 13 (ref 10–24)
BUN: 12 mg/dL (ref 8–27)
CO2: 23 mmol/L (ref 20–29)
Calcium: 9.3 mg/dL (ref 8.6–10.2)
Chloride: 100 mmol/L (ref 96–106)
Creatinine, Ser: 0.91 mg/dL (ref 0.76–1.27)
Glucose: 141 mg/dL — ABNORMAL HIGH (ref 70–99)
Potassium: 4.8 mmol/L (ref 3.5–5.2)
Sodium: 138 mmol/L (ref 134–144)
eGFR: 94 mL/min/{1.73_m2} (ref >59)

## 2023-10-21 NOTE — Progress Notes (Signed)
 Agree with assessment/plan. -If still with problems and above WU is neg, would proceed with CT scan abdo/pelvis.   Magnus Schuller, MD [facility] [phone]

## 2023-10-22 ENCOUNTER — Other Ambulatory Visit: Payer: Self-pay

## 2023-10-22 MED ORDER — DICYCLOMINE HCL 10 MG PO CAPS: 10.0000 mg | ORAL_CAPSULE | Freq: Three times a day (TID) | ORAL | 6 refills | Status: DC

## 2023-10-29 ENCOUNTER — Encounter (INDEPENDENT_AMBULATORY_CARE_PROVIDER_SITE_OTHER): Payer: Self-pay

## 2023-10-29 ENCOUNTER — Ambulatory Visit (INDEPENDENT_AMBULATORY_CARE_PROVIDER_SITE_OTHER): Admitting: Otolaryngology

## 2023-10-29 VITALS — BP 121/74 | HR 70 | Ht 71.0 in | Wt 224.0 lb

## 2023-10-29 DIAGNOSIS — R42 Dizziness and giddiness: Secondary | ICD-10-CM | POA: Diagnosis not present

## 2023-10-30 DIAGNOSIS — R42 Dizziness and giddiness: Secondary | ICD-10-CM | POA: Insufficient documentation

## 2023-10-30 NOTE — Progress Notes (Signed)
 CC: Recurrent dizziness  HPI:  Danny [name] is a 63 y.o. male who presents today complaining of recurrent dizziness for the past 3 months.  He describes his dizziness as an off-balance and lightheaded sensation.  His last episode was 1 week ago.  He denies any significant nausea or vomiting.  He has noted occasional bilateral aural pressure.  However, he denies any significant hearing change or tinnitus recently.  He has a history of recurrent childhood otitis media.  He has no previous ENT surgery.  He has noted occasional hearing difficulty in noisy environments.  He denies any cardiopulmonary disorder.  He has hypertension, and is currently on blood pressure medicines.  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 Failed Multaq. b. Prev on flecainide . c. s/p afib ablation 2015.    Past Surgical History:  Procedure Laterality Date   ABLATION  03/24/2014   PVI by Dr Nunzio Belch   ATRIAL FIBRILLATION ABLATION N/A 03/24/2014   Procedure: ATRIAL FIBRILLATION ABLATION;  Surgeon: Ellaree Gunther, MD;  Location: [facility] CATH LAB;  Service: Cardiovascular;  Laterality: N/A;   COLONOSCOPY  11/25/2012   Mild sigmoid diverticulosis, small internal hemorrhoids, otherwise normal colonoscopy   CYSTOSCOPY  04/26/2008   neg  @ urology, was rx Flomax    LEFT HEART CATH AND CORONARY ANGIOGRAPHY N/A 08/30/2021   Procedure: LEFT HEART CATH AND CORONARY ANGIOGRAPHY;  Surgeon: Millicent Ally, MD;  Location: [facility]  INVASIVE CV LAB;  Service: Cardiovascular;  Laterality: N/A;   PROSTATE SURGERY     TUNA   TEE WITHOUT CARDIOVERSION N/A 03/23/2014   Procedure: TRANSESOPHAGEAL ECHOCARDIOGRAM (TEE);  Surgeon: Elmyra Haggard, MD;  Location: [facility] ENDOSCOPY;  Service: Cardiovascular;  Laterality: N/A;   VASECTOMY     several years ago    Family History  Problem Relation Age of Onset   Heart disease Mother    Diabetes Mother    Hypertension Mother    Congestive Heart Failure Mother    Heart attack Mother    Hypertension Father    Colon cancer Neg Hx    Prostate cancer Neg Hx    Stomach cancer Neg Hx    Rectal cancer Neg Hx     Social History:  reports that he quit smoking about 38 years ago. His smoking use included cigarettes. He has never used smokeless tobacco. He reports that he does not drink alcohol and does not use drugs.  Allergies: No Known Allergies  Prior to Admission medications   Medication Sig Start Date End Date Taking? Authorizing Provider  acetaminophen  (TYLENOL ) 650 MG CR tablet Take 650 mg by mouth every 8 (eight) hours as needed for pain.   Yes [provider]  azelastine  (ASTELIN ) 0.1 % nasal spray Place 2 sprays into both nostrils 2 (two) times daily. Use in each nostril as directed 12/14/20  Yes Ezell Hollow, MD  BD VEO  INSULIN  SYRINGE U/F 31G X 15/64" 0.5 ML MISC USE 1 SYRINGE AS DIRECTED 3 TO 4 TIMES DAILY 07/10/19  Yes Lajean Pike, MD  Blood Glucose Monitoring Suppl (ONETOUCH VERIO REFLECT) w/Device KIT 1 10/04/21  Yes Lajean Pike, MD  Continuous Glucose Sensor (DEXCOM G7 SENSOR) MISC CHANGE EVERY 10 DAYS 08/24/23  Yes Thapa, [place], MD  Dapagliflozin  Pro-metFORMIN  ER (XIGDUO  XR) 10-998 MG TB24 Take 1 tablet by mouth 2 (two) times daily. 08/27/23  Yes Thapa, [place], MD  dicyclomine  (BENTYL ) 10 MG capsule Take 1 capsule (10 mg total) by mouth 4 (four) times daily -  before meals and at bedtime. 10/22/23  Yes Lajuan Pila, MD  ELIQUIS  5 MG TABS tablet TAKE 1 TABLET(5 MG) BY MOUTH  TWICE DAILY 10/12/23  Yes Lenise Quince, MD  fluticasone  (FLONASE ) 50 MCG/ACT nasal spray Place 2 sprays into both nostrils daily as needed for allergies or rhinitis. 04/24/23  Yes Paz, Anitra Ket, MD  Glucagon  (BAQSIMI  ONE PACK) 3 MG/DOSE POWD To be sprayed in nostril as needed for severe hypoglycemia 10/10/21  Yes Lajean Pike, MD  glucose blood (ONETOUCH VERIO) test strip USE AS DIRECTED TO CHECK BLOOD SUGAR 4 TIMES DAILY 05/15/22  Yes Lajean Pike, MD  hydrochlorothiazide  (MICROZIDE ) 12.5 MG capsule Take 1 capsule (12.5 mg total) by mouth daily. 10/04/23  Yes Debria Fang, PA-C  irbesartan  (AVAPRO ) 75 MG tablet Take 1 tablet (75 mg total) by mouth daily. 10/04/23  Yes Liane Redman R, PA-C  Lancets [facility] DELICA PLUS [place]) MISC Use to check blood sugar 4 times per day 10/04/21  Yes Lajean Pike, MD  loratadine  (CLARITIN ) 10 MG tablet Take 10 mg by mouth daily.   Yes [provider]  meclizine (ANTIVERT) 25 MG tablet Take 25 mg by mouth as needed. 07/30/23  Yes [provider]  Multiple Vitamins-Minerals (MULTIVITAMIN WITH MINERALS) tablet Take 1 tablet by mouth daily.   Yes [provider]  nitroGLYCERIN  (NITROSTAT ) 0.4 MG SL tablet DISSOLVE ONE TABLET UNDER THE TONGUE EVERY 5 MINUTES AS NEEDED FOR CHEST PAIN.  DO NOT EXCEED A TOTAL OF 3 DOSES IN 15 MINUTES 09/11/22  Yes Lenise Quince, MD  NOVOLOG  FLEXPEN 100 UNIT/ML FlexPen INJECT 10 TO 12 UNITS SUBCUTANEOUSLY THREE TIMES DAILY BEFORE MEAL(S) 09/21/23  Yes Thapa, [place], MD  omega-3 acid ethyl esters (LOVAZA) 1 g capsule Take by mouth.   Yes [provider]  ondansetron  (ZOFRAN -ODT) 4 MG disintegrating tablet Take 1 tablet (4 mg total) by mouth every 8 (eight) hours as needed for nausea or vomiting. 08/27/23  Yes Guss Legacy, FNP  pantoprazole  (PROTONIX ) 40 MG tablet TAKE 1 TABLET BY MOUTH ONCE DAILY 30  MINUTES  PRIOR  TO  EATING 04/30/23  Yes Lajuan Pila, MD  Polyethyl Glyc-Propyl Glyc PF 0.4-0.3 %  SOLN Apply 2 drops to eye 3 (three) times daily as needed (for dry eyes).   Yes [provider]  pregabalin  (LYRICA ) 100 MG capsule Take 1 capsule (100 mg total) by mouth 3 (three) times daily. 08/27/23  Yes Semon, Jake L, DPM  Probiotic Product (PROBIOTIC DAILY PO) Take 1 capsule by mouth daily.   Yes [provider]  rosuvastatin  (CRESTOR ) 40 MG tablet Take 1 tablet by mouth once daily 09/06/23  Yes Crenshaw, Deannie Fabian, MD  Semaglutide , 2 MG/DOSE, 8 MG/3ML SOPN Inject 2 mg as directed once a week. 04/12/23  Yes Thapa, [place], MD  silodosin (RAPAFLO) 8 MG CAPS capsule Take 8 mg by mouth daily with  breakfast.   Yes [provider]  TRESIBA  FLEXTOUCH 100 UNIT/ML FlexTouch Pen INJECT 46 UNITS SUBCUTANEOUSLY ONCE DAILY 06/16/23  Yes Thapa, [place], MD  triamcinolone cream (KENALOG) 0.1 % Apply 1 Application topically as needed. 10/05/23  Yes [provider]    Blood pressure 121/74, pulse 70, height 5\' 11"  (1.803 m), weight 224 lb (101.6 kg), SpO2 95%. Exam: General: Communicates without difficulty, well nourished, no acute distress. Head: Normocephalic, no evidence injury, no tenderness, facial buttresses intact without stepoff. Face/sinus: No tenderness to palpation and percussion. Facial movement is normal and symmetric. Eyes: PERRL, EOMI. No scleral icterus, conjunctivae clear. Neuro: CN II exam reveals vision grossly intact.  No nystagmus at any point of gaze. Ears: Auricles well formed without lesions.  Ear canals are intact without mass or lesion.  No erythema or edema is appreciated.  The TMs are intact without fluid. Nose: External evaluation reveals normal support and skin without lesions.  Dorsum is intact.  Anterior rhinoscopy reveals congested mucosa over anterior aspect of inferior turbinates and intact septum.  No purulence noted. Oral:  Oral cavity and oropharynx are intact, symmetric, without erythema or edema.  Mucosa is moist without lesions. Neck: Full range of  motion without pain.  There is no significant lymphadenopathy.  No masses palpable.  Thyroid  bed within normal limits to palpation.  Parotid glands and submandibular glands equal bilaterally without mass.  Trachea is midline. Neuro:  CN 2-12 grossly intact. Vestibular: No nystagmus at any point of gaze. Dix Hallpike negative. Vestibular: There is no nystagmus with pneumatic pressure on either tympanic membrane or Valsalva. The cerebellar examination is unremarkable.   Assessment: 1.  Recurrent dizziness of unknown etiology. The possible differential diagnoses include transient BPPV, vestibular migraine, Meniere's disease, peripheral vestibular dysfunction, or other central/systemic causes.   2.  His ear canals, tympanic membrane, and middle ear spaces are normal.  His Dix-Hallpike maneuver is negative.  Plan: 1.  The physical exam findings are reviewed with the patient. 2.  The pathophysiology of vestibular dysfunction and dizziness are discussed extensively with the patient. The possible differential diagnoses are reviewed. Questions are invited and answered.   3.  The patient will likely benefit from undergoing physical therapy/vestibular rehabilitation to improve the balancing function. A referral will be arranged as soon as possible.  4.  If the patient continues to be symptomatic, he may benefit from vestibular neurodiagnostic testing at a tertiary care center to evaluate for possible vestibular dysfunction.   Makira Holleman W Avian Greenawalt 10/30/2023, 9:46 AM

## 2023-11-14 ENCOUNTER — Other Ambulatory Visit (HOSPITAL_BASED_OUTPATIENT_CLINIC_OR_DEPARTMENT_OTHER): Payer: Self-pay

## 2023-11-14 ENCOUNTER — Ambulatory Visit (HOSPITAL_BASED_OUTPATIENT_CLINIC_OR_DEPARTMENT_OTHER)
Admission: RE | Admit: 2023-11-14 | Discharge: 2023-11-14 | Disposition: A | Discharge status: Home / Self Care | Source: Ambulatory Visit | Attending: Family Medicine | Admitting: Family Medicine

## 2023-11-14 ENCOUNTER — Encounter (HOSPITAL_BASED_OUTPATIENT_CLINIC_OR_DEPARTMENT_OTHER): Payer: Self-pay

## 2023-11-14 VITALS — BP 133/74 | HR 65 | Temp 98.5°F | Resp 20

## 2023-11-14 DIAGNOSIS — S80812A Abrasion, left lower leg, initial encounter: Secondary | ICD-10-CM | POA: Diagnosis present

## 2023-11-14 DIAGNOSIS — L03116 Cellulitis of left lower limb: Secondary | ICD-10-CM | POA: Diagnosis present

## 2023-11-14 MED ORDER — MUPIROCIN 2 % EX OINT
1.0000 | TOPICAL_OINTMENT | Freq: Two times a day (BID) | CUTANEOUS | 0 refills | Status: DC
  Filled 2023-11-14: qty 22, 11d supply, fill #0

## 2023-11-14 MED ORDER — CLINDAMYCIN HCL 300 MG PO CAPS
300.0000 mg | ORAL_CAPSULE | Freq: Three times a day (TID) | ORAL | 0 refills | Status: AC
  Filled 2023-11-14: qty 30, 10d supply, fill #0

## 2023-11-14 MED ORDER — CEFTRIAXONE SODIUM 1 G IJ SOLR
1.0000 g | Freq: Once | INTRAMUSCULAR | Status: AC
  Administered 2023-11-14: 1 g via INTRAMUSCULAR

## 2023-11-14 NOTE — Discharge Instructions (Addendum)
 Abrasion of left lower leg with secondary cellulitis: Rocephin/ceftriaxone 1000 mg injection now.  Clindamycin 300 mg 3 times daily with food for 10 days.  Wound care to abrasion: Wash with warm soapy fingers, rinse, pat dry, apply mupirocin ointment and a dry dressing.  Clean wound at least once daily.  Follow-up if symptoms do not improve, worsen or new symptoms occur.

## 2023-11-14 NOTE — ED Triage Notes (Signed)
 Injured leg on piece of wood on 5/14. Now has area of redness, swelling, warm to touch to left lower leg. Tdap 2022

## 2023-11-14 NOTE — ED Provider Notes (Signed)
 Danny [name] [facility]    CSN: [id number] Arrival date & time: 11/14/23  1544      History   Chief Complaint Chief Complaint  Patient presents with   Leg Injury    Injured left lower leg when a piece of wood split and hit leg on5/14/25 but still hurting a lot. - Entered by patient    HPI Danny [name] is a 63 y.o. male.   Patient was splitting some wood on 11/07/2023.  The log he was working with was very large and he did an initial cut in the middle to make it smaller.  The log snapped into and fell and part of it hit his left leg.  He has a large abrasion with what appears to be secondary infection on the left lower leg anteriorly.  He has been doing Epsom salt soaks daily for 2 or 3 days and antibiotic ointment daily.  He has been using Polysporin and Neosporin on his leg.  He states that it actually looks better now than it did on 11/11/2023.     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 Failed Multaq. b. Prev on flecainide . c. s/p afib ablation 2015.    Patient Active Problem List   Diagnosis Date Noted   Dizziness 10/30/2023   Elevated coronary artery calcium  score    Secondary hypercoagulable state (HCC) 11/16/2020   Abdominal pain, vomiting, and diarrhea    GI bleed 10/31/2016   Gross hematuria 07/26/2016   Obesity 01/21/2016   PCP NOTES >>>>>>>>>>>>>>>>>>>>>>> 08/12/2015   Bilateral foot pain 09/10/2014   Annual physical exam 01/13/2011    Hyperlipidemia    Hypertension    Paroxysmal atrial fibrillation (HCC) 12/21/2009   DYSLIPIDEMIA 12/14/2009   CARPAL TUNNEL SYNDROME 06/17/2009   Controlled type 2 diabetes mellitus with complication, with long-term current use of insulin  (HCC) 03/12/2007   Allergic rhinitis 03/12/2007   BENIGN PROSTATIC HYPERTROPHY 03/12/2007   ERECTILE DYSFUNCTION, 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    Past Surgical History:  Procedure Laterality Date   ABLATION  03/24/2014   PVI by Dr Nunzio Belch   ATRIAL FIBRILLATION ABLATION N/A 03/24/2014   Procedure: ATRIAL FIBRILLATION ABLATION;  Surgeon: Ellaree Gunther, MD;  Location: [facility] CATH LAB;  Service: Cardiovascular;  Laterality: N/A;   COLONOSCOPY  11/25/2012   Mild sigmoid diverticulosis, small internal hemorrhoids, otherwise normal colonoscopy   CYSTOSCOPY  04/26/2008   neg  @ urology, was rx Flomax    LEFT HEART CATH AND CORONARY ANGIOGRAPHY N/A 08/30/2021   Procedure: LEFT HEART CATH AND CORONARY ANGIOGRAPHY;  Surgeon: Millicent Ally, MD;  Location: [facility] INVASIVE CV LAB;  Service: Cardiovascular;  Laterality: N/A;   PROSTATE SURGERY     TUNA   TEE WITHOUT CARDIOVERSION N/A 03/23/2014   Procedure: TRANSESOPHAGEAL ECHOCARDIOGRAM (TEE);  Surgeon: Elmyra Haggard, MD;  Location: [facility] ENDOSCOPY;  Service: Cardiovascular;  Laterality: N/A;   VASECTOMY  several years ago       Home Medications    Prior to Admission medications   Medication Sig Start Date End Date Taking? Authorizing Provider  clindamycin (CLEOCIN) 300 MG capsule Take 1 capsule (300 mg total) by mouth 3 (three) times daily for 10 days. 11/14/23 11/24/23 Yes Guss Legacy, FNP  mupirocin ointment (BACTROBAN) 2 % Apply 1 Application topically 2 (two) times daily. 11/14/23  Yes Guss Legacy, FNP  acetaminophen  (TYLENOL ) 650 MG CR tablet Take 650 mg by mouth every 8 (eight) hours as needed for pain.    [provider]  azelastine  (ASTELIN ) 0.1 % nasal spray Place 2 sprays into both nostrils 2 (two) times  daily. Use in each nostril as directed 12/14/20   Ezell Hollow, MD  BD VEO INSULIN  SYRINGE U/F 31G X 15/64" 0.5 ML MISC USE 1 SYRINGE AS DIRECTED 3 TO 4 TIMES DAILY 07/10/19   Lajean Pike, MD  Blood Glucose Monitoring Suppl (ONETOUCH VERIO REFLECT) w/Device KIT 1 10/04/21   Lajean Pike, MD  Continuous Glucose Sensor (DEXCOM G7 SENSOR) MISC CHANGE EVERY 10 DAYS 08/24/23   Thapa, [place], MD  Dapagliflozin  Pro-metFORMIN  ER (XIGDUO  XR) 10-998 MG TB24 Take 1 tablet by mouth 2 (two) times daily. 08/27/23   Thapa, [place], MD  dicyclomine  (BENTYL ) 10 MG capsule Take 1 capsule (10 mg total) by mouth 4 (four) times daily -  before meals and at bedtime. 10/22/23   Lajuan Pila, MD  ELIQUIS  5 MG TABS tablet TAKE 1 TABLET(5 MG) BY MOUTH TWICE DAILY 10/12/23   Lenise Quince, MD  fluticasone  (FLONASE ) 50 MCG/ACT nasal spray Place 2 sprays into both nostrils daily as needed for allergies or rhinitis. 04/24/23   Ezell Hollow, MD  Glucagon  (BAQSIMI  ONE PACK) 3 MG/DOSE POWD To be sprayed in nostril as needed for severe hypoglycemia 10/10/21   Lajean Pike, MD  glucose blood (ONETOUCH VERIO) test strip USE AS DIRECTED TO CHECK BLOOD SUGAR 4 TIMES DAILY 05/15/22   Lajean Pike, MD  hydrochlorothiazide  (MICROZIDE ) 12.5 MG capsule Take 1 capsule (12.5 mg total) by mouth daily. 10/04/23   Debria Fang, PA-C  irbesartan  (AVAPRO ) 75 MG tablet Take 1 tablet (75 mg total) by mouth daily. 10/04/23   Debria Fang, PA-C  Lancets [facility] DELICA PLUS [place]) MISC Use to check blood sugar 4 times per day 10/04/21   Lajean Pike, MD  loratadine  (CLARITIN ) 10 MG tablet Take 10 mg by mouth daily.    [provider]  meclizine (ANTIVERT) 25 MG tablet Take 25 mg by mouth as needed. 07/30/23   [provider]  Multiple Vitamins-Minerals (MULTIVITAMIN WITH MINERALS) tablet Take 1 tablet by mouth daily.    [provider]  nitroGLYCERIN  (NITROSTAT ) 0.4 MG SL tablet DISSOLVE ONE TABLET UNDER THE TONGUE EVERY 5  MINUTES AS NEEDED FOR CHEST PAIN.  DO NOT EXCEED A TOTAL OF 3 DOSES IN 15 MINUTES 09/11/22   Lenise Quince, MD  NOVOLOG  FLEXPEN 100 UNIT/ML FlexPen INJECT 10 TO 12 UNITS SUBCUTANEOUSLY THREE TIMES DAILY BEFORE MEAL(S) 09/21/23   Thapa, [place], MD  omega-3 acid ethyl esters (LOVAZA) 1 g capsule Take by mouth.    [provider]  ondansetron  (ZOFRAN -ODT) 4 MG disintegrating tablet Take 1 tablet (4 mg total) by mouth every 8 (eight) hours as needed for nausea or vomiting. 08/27/23   Guss Legacy, FNP  pantoprazole  (PROTONIX ) 40 MG tablet TAKE 1 TABLET BY MOUTH ONCE DAILY 30  MINUTES  PRIOR  TO  EATING 04/30/23   Lajuan Pila, MD  Polyethyl Glyc-Propyl Glyc PF 0.4-0.3 % SOLN Apply 2 drops to eye 3 (three) times daily as needed (for dry eyes).    [provider]  pregabalin  (LYRICA ) 100 MG capsule Take 1 capsule (100 mg total) by mouth 3 (three) times daily. 08/27/23   Reina Cara, DPM  Probiotic Product (PROBIOTIC DAILY PO) Take 1 capsule by mouth daily.    [provider]  rosuvastatin  (CRESTOR ) 40 MG tablet Take 1 tablet by mouth once daily 09/06/23   Lenise Quince, MD  Semaglutide , 2 MG/DOSE, 8 MG/3ML SOPN Inject 2 mg as directed once a week. 04/12/23   Thapa, [place], MD  silodosin (RAPAFLO) 8 MG CAPS capsule Take 8 mg by mouth daily with breakfast.    [provider]  TRESIBA  FLEXTOUCH 100 UNIT/ML FlexTouch Pen INJECT 46 UNITS SUBCUTANEOUSLY ONCE DAILY 06/16/23   Thapa, [place], MD  triamcinolone cream (KENALOG) 0.1 % Apply 1 Application topically as needed. 10/05/23   [provider]    Family History Family History  Problem Relation Age of Onset   Heart disease Mother    Diabetes Mother    Hypertension Mother    Congestive Heart Failure Mother    Heart attack Mother    Hypertension Father    Colon cancer Neg Hx    Prostate cancer Neg Hx    Stomach cancer Neg Hx    Rectal cancer Neg Hx     Social History Social History   Tobacco Use    Smoking status: Former    Current packs/day: 0.00    Types: Cigarettes    Quit date: 06/26/1985    Years since quitting: 38.4   Smokeless tobacco: Never   Tobacco comments:    Former smoker, quit 1987  Vaping Use   Vaping status: Never Used  Substance Use Topics   Alcohol use: No    Comment:     Drug use: No     Allergies   Patient has no known allergies.   Review of Systems Review of Systems  Constitutional:  Negative for fever.  Respiratory:  Negative for cough.   Cardiovascular:  Negative for chest pain.  Gastrointestinal:  Negative for abdominal pain, constipation, diarrhea, nausea and vomiting.  Musculoskeletal:  Negative for arthralgias and back pain.  Skin:  Positive for wound (anterior/medial left lower leg). Negative for color change and rash.  Neurological:  Negative for syncope.  All other systems reviewed and are negative.    Physical Exam Triage Vital Signs ED Triage Vitals  Encounter Vitals Group     BP 11/14/23 1607 133/74     Systolic BP Percentile --      Diastolic BP Percentile --      Pulse Rate 11/14/23 1607 65     Resp 11/14/23 1607 20     Temp 11/14/23 1607 98.5 F (36.9 C)     Temp Source 11/14/23 1607 Oral     SpO2 11/14/23 1607 96 %     Weight --      Height --      Head Circumference --      Peak Flow --      Pain Score 11/14/23 1609 5     Pain Loc --      Pain Education --      Exclude from Growth Chart --    No data found.  Updated Vital Signs BP 133/74 (BP Location: Left Arm)  Pulse 65   Temp 98.5 F (36.9 C) (Oral)   Resp 20   SpO2 96%   Visual Acuity Right Eye Distance:   Left Eye Distance:   Bilateral Distance:    Right Eye Near:   Left Eye Near:    Bilateral Near:     Physical Exam Vitals and nursing note reviewed.  Constitutional:      General: He is not in acute distress.    Appearance: He is well-developed. He is not ill-appearing or toxic-appearing.  HENT:     Head: Normocephalic and atraumatic.      Right Ear: External ear normal.     Left Ear: External ear normal.     Nose: Nose normal.     Mouth/Throat:     Lips: Pink.     Mouth: Mucous membranes are moist.  Eyes:     Conjunctiva/sclera: Conjunctivae normal.     Pupils: Pupils are equal, round, and reactive to light.  Cardiovascular:     Rate and Rhythm: Normal rate and regular rhythm.     Heart sounds: S1 normal and S2 normal. No murmur heard. Pulmonary:     Effort: Pulmonary effort is normal. No respiratory distress.     Breath sounds: Normal breath sounds. No decreased breath sounds, wheezing, rhonchi or rales.  Musculoskeletal:        General: No swelling.  Skin:    General: Skin is warm and dry.     Capillary Refill: Capillary refill takes less than 2 seconds.     Findings: Wound (see comments for more information) present. No rash.     Comments: Left lower leg anterior medial area with a 8 inch x 5 inch area of erythema with a 2 or 3 inch long linear abrasio.  The abrasion is mostly 1 mm wide but in some places it is 3 to 4 mm wide.  There is a serous, crusted area on the abrasion, but no active drainage today.  Neurological:     Mental Status: He is alert and oriented to person, place, and time.  Psychiatric:        Mood and Affect: Mood normal.      UC Treatments / Results  Labs (all labs ordered are listed, but only abnormal results are displayed) Labs Reviewed  AEROBIC CULTURE W GRAM STAIN (SUPERFICIAL SPECIMEN)    EKG   Radiology No results found.  Procedures Procedures (including critical care time)  Medications Ordered in UC Medications  cefTRIAXone (ROCEPHIN) injection 1 g (1 g Intramuscular Given 11/14/23 1710)    Initial Impression / Assessment and Plan / UC Course  I have reviewed the triage vital signs and the nursing notes.  Pertinent labs & imaging results that were available during my care of the patient were reviewed by me and considered in my medical decision making (see chart for  details).  Plan of Care: Abrasion with secondary cellulitis of left lower leg: Wound culture collected and sent.  Will adjust the plan of care if needed once the culture results.  Rocephin 1000 mg IM now.  Clindamycin 300 mg 3 times daily with food for 10 days.  Mupirocin ointment to the site with wound care as directed below.  Follow-up if symptoms do not improve, worsen or new symptoms occur.  I reviewed the plan of care with the patient and/or the patient's guardian.  The patient and/or guardian had time to ask questions and acknowledged that the questions were answered.  I provided instruction on  symptoms or reasons to return here or to go to an ER, if symptoms/condition did not improve, worsened or if new symptoms occurred.  Final Clinical Impressions(s) / UC Diagnoses   Final diagnoses:  Abrasion, left lower leg, initial encounter  Cellulitis of left anterior lower leg     Discharge Instructions      Abrasion of left lower leg with secondary cellulitis: Rocephin/ceftriaxone 1000 mg injection now.  Clindamycin 300 mg 3 times daily with food for 10 days.  Wound care to abrasion: Wash with warm soapy fingers, rinse, pat dry, apply mupirocin ointment and a dry dressing.  Clean wound at least once daily.  Follow-up if symptoms do not improve, worsen or new symptoms occur.   ED Prescriptions     Medication Sig Dispense Auth. Provider   clindamycin (CLEOCIN) 300 MG capsule Take 1 capsule (300 mg total) by mouth 3 (three) times daily for 10 days. 30 capsule Guss Legacy, FNP   mupirocin ointment (BACTROBAN) 2 % Apply 1 Application topically 2 (two) times daily. 22 g Guss Legacy, FNP      PDMP not reviewed this encounter.   Guss Legacy, FNP 11/14/23 [phone]

## 2023-11-19 ENCOUNTER — Ambulatory Visit (HOSPITAL_BASED_OUTPATIENT_CLINIC_OR_DEPARTMENT_OTHER): Payer: Self-pay | Admitting: Family Medicine

## 2023-11-19 LAB — AEROBIC CULTURE W GRAM STAIN (SUPERFICIAL SPECIMEN)
Culture: NO GROWTH
Gram Stain: NONE SEEN

## 2023-11-19 NOTE — Progress Notes (Signed)
 Culture was negative.  The swab was not easy to obtain.  The area was crusted and dry.  Patient advised to complete the antibiotic and follow-up as needed.  Patient updated via voicemail message.

## 2023-11-22 LAB — HM DIABETES EYE EXAM

## 2023-12-03 ENCOUNTER — Ambulatory Visit: Admitting: Podiatry

## 2023-12-05 ENCOUNTER — Other Ambulatory Visit: Payer: Self-pay | Admitting: Cardiology

## 2023-12-05 DIAGNOSIS — E785 Hyperlipidemia, unspecified: Secondary | ICD-10-CM

## 2023-12-17 ENCOUNTER — Ambulatory Visit: Admitting: Podiatry

## 2023-12-17 DIAGNOSIS — Z794 Long term (current) use of insulin: Secondary | ICD-10-CM

## 2023-12-17 DIAGNOSIS — M79674 Pain in right toe(s): Secondary | ICD-10-CM

## 2023-12-17 DIAGNOSIS — L84 Corns and callosities: Secondary | ICD-10-CM

## 2023-12-17 DIAGNOSIS — M79675 Pain in left toe(s): Secondary | ICD-10-CM

## 2023-12-17 DIAGNOSIS — B351 Tinea unguium: Secondary | ICD-10-CM

## 2023-12-17 DIAGNOSIS — E0840 Diabetes mellitus due to underlying condition with diabetic neuropathy, unspecified: Secondary | ICD-10-CM

## 2023-12-17 NOTE — Progress Notes (Unsigned)
  Subjective:  Patient ID: Danny [name] No, male    DOB: 01-31-1961,  MRN: [id number]  Chief Complaint  Patient presents with   Difficulty Walking    [facility] with possible callous. Last A1c is 7.0 currently. (DexCom) Eliquis .     63 y.o. male presents with the above complaint. History confirmed with patient. Patient presenting with pain related to dystrophic thickened elongated nails. Patient is unable to trim own nails related to nail dystrophy and/or mobility issues. Patient does  have a history of T2DM. Patient does have callus present located at bilateral  fifth met heads causing pain.  Patient does have neuropathic pain taking Lyrica  currently.  He does feel that it is worsening.  Objective:  Physical Exam: warm, good capillary refill, diminished pedal hair growth nail exam onychomycosis of the toenails, onycholysis, and dystrophic nails DP pulses palpable, PT pulses palpable, and protective sensation intact, decreased vibratory sensation, subjective neuropathy symptoms. Left Foot:  Pain with palpation of nails due to elongation and dystrophic growth.  Hyperkeratotic lesion sub one and fifth metatarsal heads. Right Foot: Pain with palpation of nails due to elongation and dystrophic growth.  Right foot with hyperkeratotic lesion subone and subfifth metatarsal head.  Assessment:   1. Diabetes mellitus due to underlying condition, controlled, with diabetic neuropathy, with long-term current use of insulin  (HCC)   2. Pain due to onychomycosis of toenails of both feet   3. Pre-ulcerative calluses         Plan:  Patient was evaluated and treated and all questions answered.   # Preulcerative calluses bilateral subfifth metatarsal heads bilaterally -Preulcerative calluses x 2 were sharply debrided using 312 scalpel blade without incident. - Discussed home care for this, use white vinegar scrub and may gently use pumice stone to manage the calluses. -Counseled patient on diabetic foot  education   #Onychomycosis with pain  -Nails palliatively debrided as below. -Educated on self-care  Procedure: Nail Debridement Rationale: Pain Type of Debridement: manual, sharp debridement. Instrumentation: Nail nipper, rotary burr. Number of Nails: 10  # Diabetes with neuropathy -Patient educated on diabetes. Discussed proper diabetic foot care and discussed risks and complications of disease. Educated patient in depth on reasons to return to the office immediately should he/she discover anything concerning or new on the feet. All questions answered. Discussed proper shoes as well.  - Continue Lyrica  3 times daily.  To briefly discuss spinal cord stimulator.  He will let us  know if he would like referral for this.  Return in about 3 months (around 03/18/2024) for Diabetic Foot Care.         Ethan Saddler, DPM [facility] / [facility]

## 2024-01-01 ENCOUNTER — Ambulatory Visit: Admitting: Podiatry

## 2024-01-01 DIAGNOSIS — L03032 Cellulitis of left toe: Secondary | ICD-10-CM

## 2024-01-01 DIAGNOSIS — L600 Ingrowing nail: Secondary | ICD-10-CM

## 2024-01-01 MED ORDER — CEPHALEXIN 500 MG PO CAPS
500.0000 mg | ORAL_CAPSULE | Freq: Three times a day (TID) | ORAL | 2 refills | Status: DC
Start: 2024-01-01 — End: 2024-02-19

## 2024-01-01 NOTE — Patient Instructions (Signed)

## 2024-01-01 NOTE — Progress Notes (Signed)
 Subjective:  Patient ID: Danny Danny [name], male    DOB: Dec 06, 1960,  MRN: [id number]  Danny Danny [name] presents to clinic today for:  Chief Complaint  Patient presents with   Nail Problem    Left great nail is lose and he would like to go ahead and have it removed permanently.  Last A1c was 6.7 in April, Eliquis   Patient presents today following up for above complaint.  He did recently come in for diabetic footcare.  He has had persistent ongoing problem with the left first toenail stating that it feels loose and he is concerned about some serous drainage that he has noted to this area.  He would like to discuss removal of the toenail today.  He is diabetic.  Last A1c 6.7.  He is on Eliquis .  PCP is Danny Aloysius BRAVO, MD.  Danny [name] Known Allergies  Review of Systems: Negative except as noted in the HPI.  Objective:  There were Danny [name] vitals filed for this visit.  Danny Danny [name] is a pleasant 63 y.o. male in NAD. AAO x 3.  Vascular Examination: Capillary refill time is less than 3 seconds to toes bilateral. Palpable pedal pulses b/l LE.  Diminished digital hair present b/l. Danny [name] pedal edema b/l. Skin temperature gradient WNL b/l. Danny [name] varicosities b/l. Danny [name] cyanosis or clubbing noted b/l.   Dermatological Examination: Left hallux nail plate is loose and elevated.  There is some scant serous drainage present.  Some localized erythema about the left hallux surrounding the eponychium.  Danny [name] fluctuance appreciated.  Neurological Examination: Protective sensation intact with Semmes-Weinstein 10 gram monofilament b/l LE.  Vibratory sensation decreased.     Latest Ref Rng & Units 08/06/2023    8:13 AM 04/09/2023    8:48 AM 02/05/2023   10:05 AM  Hemoglobin A1C  Hemoglobin-A1c <5.7 % of total Hgb 7.3  6.8  7.1      Assessment/Plan: 1. Paronychia of great toe of left foot   2. Cellulitis of toe of left foot     Meds ordered this encounter  Medications   cephALEXin  (KEFLEX ) 500 MG capsule    Sig:  Take 1 capsule (500 mg total) by mouth 3 (three) times daily.    Dispense:  30 capsule    Refill:  2   # Cellulitis left first toe Clinical findings reviewed with patient.  Some concern for cellulitis involving the left toe which does appear relatively mild.  Given the patient's diabetes, starting oral Keflex  for 10 days out of abundance of caution. -I certify that this diagnosis represents a distinct and separate diagnosis that requires evaluation and treatment separate from other procedures or diagnosis  # Paronychia left first toe Given the nail deformity with nail plate elevation, did recommend total nail avulsion with chemical matrixectomy.  Discussed patient's condition today.  After obtaining patient consent, the left hallux was anesthetized with a 50:50 mixture of 1% lidocaine  plain and 0.5% bupivacaine  plain for a total of 3cc's administered.  Upon confirmation of anesthesia, a freer elevator was utilized to free the left hallux nail plate from the nail bed.  The nail plate was then avulsed proximal to the eponychium and removed in toto.  The area was inspected for any remaining spicules.  A chemical matrixectomy was performed with NaOH and neutralized with acetic acid solution.  Antibiotic ointment and a DSD were applied, followed by a Coban dressing.  Patient tolerated the anesthetic and procedure well and will f/u in  2-3 weeks for recheck.  Patient given post-procedure instructions for daily 20-minute Epsom salt soaks, antibiotic ointment and daily use of Bandaids until toe starts to dry / form eschar.    Return in 2 weeks (on 01/15/2024) for Nail Check.     Ethan Saddler, DPM, AACFAS [facility]     [address] [place], [place] [address]                Office [phone]  Fax [phone]

## 2024-01-02 ENCOUNTER — Other Ambulatory Visit: Payer: Self-pay | Admitting: Cardiology

## 2024-01-04 ENCOUNTER — Encounter: Payer: Self-pay | Admitting: Podiatry

## 2024-01-09 ENCOUNTER — Encounter: Payer: Self-pay | Admitting: Internal Medicine

## 2024-01-15 ENCOUNTER — Ambulatory Visit (INDEPENDENT_AMBULATORY_CARE_PROVIDER_SITE_OTHER): Admitting: Podiatry

## 2024-01-15 DIAGNOSIS — L03032 Cellulitis of left toe: Secondary | ICD-10-CM | POA: Diagnosis not present

## 2024-01-15 NOTE — Progress Notes (Unsigned)
       Subjective:  Patient ID: Danny [name], male    DOB: [birth year],  MRN: [id number]  Chief Complaint  Patient presents with   Nail Removal follow up    Left first nail removal, 2 week f/u. It looks great today. He has been keeping it covered with a Band-Aid while in shoes. Last A1c was 6.3 in April or may, he goes on thrusday for a recheck. Takes Eliquis .     Danny [name] presents to clinic today for f/u of Total nail avulsion with chemical matrixectomy to the left great toe. He denies pain to the area, believes it is healing well. Redness appears resolved.  PCP is Danny Aloysius BRAVO, MD.  [name] Known Allergies  Objective:  There were [name] vitals filed for this visit.  Vascular Examination: Capillary refill time is less than 3 seconds to toes bilateral. Palpable pedal pulses b/l LE. Digital hair present b/l. [name] pedal edema b/l. Skin temperature gradient WNL b/l. [name] varicosities b/l. [name] cyanosis or clubbing noted b/l.   Dermatological Examination: Upon inspection of the left 1st toe nail avulsion site, appears well healed.  [name] purulence, [name] necrosis, [name] malodor present.  Erythema to the toe resolved. Stable scab to the nail bed without drainage noted.  Assessment/Plan: 1. Paronychia of great toe of left foot   2. Cellulitis of toe of left foot     [name] orders of the defined types were placed in this encounter.  Total nail avulsion site appears well healed with cellulitis resolved. Can wear a bandage for comfort at this point if toe is irritated with shoegear but otherwise [name] further soaking of aftercare needed. Has taken antibiotics to completion.  Return in about 9 weeks (around 03/18/2024) for Diabetic Foot Care.   Keandria Berrocal L. Lamount MAUL, AACFAS [facility]     [address] [place], [place] [address]                Office [phone]  Fax [phone]

## 2024-01-16 ENCOUNTER — Encounter: Payer: Self-pay | Admitting: Podiatry

## 2024-01-18 ENCOUNTER — Other Ambulatory Visit (INDEPENDENT_AMBULATORY_CARE_PROVIDER_SITE_OTHER)

## 2024-01-18 ENCOUNTER — Ambulatory Visit: Admitting: Gastroenterology

## 2024-01-18 ENCOUNTER — Encounter: Payer: Self-pay | Admitting: Gastroenterology

## 2024-01-18 VITALS — BP 132/70 | HR 67 | Ht 71.0 in | Wt 224.0 lb

## 2024-01-18 DIAGNOSIS — R10819 Abdominal tenderness, unspecified site: Secondary | ICD-10-CM

## 2024-01-18 DIAGNOSIS — K580 Irritable bowel syndrome with diarrhea: Secondary | ICD-10-CM | POA: Diagnosis not present

## 2024-01-18 DIAGNOSIS — R1030 Lower abdominal pain, unspecified: Secondary | ICD-10-CM

## 2024-01-18 DIAGNOSIS — R140 Abdominal distension (gaseous): Secondary | ICD-10-CM

## 2024-01-18 LAB — CBC WITH DIFFERENTIAL/PLATELET
Basophils Absolute: 0 K/uL (ref 0.0–0.1)
Basophils Relative: 0.5 % (ref 0.0–3.0)
Eosinophils Absolute: 0.3 K/uL (ref 0.0–0.7)
Eosinophils Relative: 4.4 % (ref 0.0–5.0)
HCT: 45.9 % (ref 39.0–52.0)
Hemoglobin: 15 g/dL (ref 13.0–17.0)
Lymphocytes Relative: 30.4 % (ref 12.0–46.0)
Lymphs Abs: 2.1 K/uL (ref 0.7–4.0)
MCHC: 32.7 g/dL (ref 30.0–36.0)
MCV: 81.6 fl (ref 78.0–100.0)
Monocytes Absolute: 0.8 K/uL (ref 0.1–1.0)
Monocytes Relative: 11.1 % (ref 3.0–12.0)
Neutro Abs: 3.7 K/uL (ref 1.4–7.7)
Neutrophils Relative %: 53.6 % (ref 43.0–77.0)
Platelets: 250 K/uL (ref 150.0–400.0)
RBC: 5.63 Mil/uL (ref 4.22–5.81)
RDW: 14.4 % (ref 11.5–15.5)
WBC: 6.9 K/uL (ref 4.0–10.5)

## 2024-01-18 LAB — COMPREHENSIVE METABOLIC PANEL WITH GFR
ALT: 68 U/L — ABNORMAL HIGH (ref 0–53)
AST: 34 U/L (ref 0–37)
Albumin: 4.4 g/dL (ref 3.5–5.2)
Alkaline Phosphatase: 75 U/L (ref 39–117)
BUN: 13 mg/dL (ref 6–23)
CO2: 30 meq/L (ref 19–32)
Calcium: 9.5 mg/dL (ref 8.4–10.5)
Chloride: 100 meq/L (ref 96–112)
Creatinine, Ser: 0.84 mg/dL (ref 0.40–1.50)
GFR: 91.83 mL/min (ref >60.00)
Glucose, Bld: 136 mg/dL — ABNORMAL HIGH (ref 70–99)
Potassium: 3.8 meq/L (ref 3.5–5.1)
Sodium: 139 meq/L (ref 135–145)
Total Bilirubin: 0.4 mg/dL (ref 0.2–1.2)
Total Protein: 7.1 g/dL (ref 6.0–8.3)

## 2024-01-18 LAB — C-REACTIVE PROTEIN: CRP: <1 mg/dL (ref 0.5–20.0)

## 2024-01-18 LAB — TSH: TSH: 1.91 u[IU]/mL (ref 0.35–5.50)

## 2024-01-18 MED ORDER — DICYCLOMINE HCL 20 MG PO TABS: 20.0000 mg | ORAL_TABLET | Freq: Three times a day (TID) | ORAL | 11 refills | Status: AC

## 2024-01-18 NOTE — Patient Instructions (Signed)
 _______________________________________________________  If your blood pressure at your visit was 140/90 or greater, please contact your primary care physician to follow up on this.  _______________________________________________________  If you are age 65 or older, your body mass index should be between 23-30. Your Body mass index is 31.24 kg/m. If this is out of the aforementioned range listed, please consider follow up with your Primary Care Provider.  If you are age 10 or younger, your body mass index should be between 19-25. Your Body mass index is 31.24 kg/m. If this is out of the aformentioned range listed, please consider follow up with your Primary Care Provider.   ________________________________________________________  The [facility] providers would like to encourage you to use MYCHART to communicate with providers for non-urgent requests or questions.  Due to long hold times on the telephone, sending your provider a message by [facility] may be a faster and more efficient way to get a response.  Please allow 48 business hours for a response.  Please remember that this is for non-urgent requests.  _______________________________________________________  [facility] is using a team-based approach to care.  Your team is made up of your doctor and two to three APPS. Our APPS (Nurse Practitioners and Physician Assistants) work with your physician to ensure care continuity for you. They are fully qualified to address your health concerns and develop a treatment plan. They communicate directly with your gastroenterologist to care for you. Seeing the Advanced Practice Practitioners on your physician's team can help you by facilitating care more promptly, often allowing for earlier appointments, access to diagnostic testing, procedures, and other specialty referrals.    We have sent the following medications to your pharmacy for you to pick up at your convenience: Bentyl  20mg  3  times a day  Your provider has requested that you go to the basement level for lab work before leaving today. Press B on the elevator. The lab is located at the first door on the left as you exit the elevator.  You have been scheduled for a CT scan of the abdomen and pelvis at [facility] (Address [address] [place], [place], [place] [address]. Left side entrance checkin 3  You are scheduled on 01-25-24 at 11am. You should arrive by 9am your appointment time for registration. Please follow the written instructions below on the day of your exam:  WARNING: IF YOU ARE ALLERGIC TO IODINE/X-RAY DYE, PLEASE NOTIFY RADIOLOGY IMMEDIATELY AT [phone]! YOU WILL BE GIVEN A 13 HOUR PREMEDICATION PREP.  1) Do not eat or drink anything after 7am (4 hours prior to your test) 2) You will be given 2 bottles of oral contrast to drink on site.    Drink 1 bottle of contrast @ 9am (2 hours prior to your exam)  Drink 1 bottle of contrast @ 10am (1 hour prior to your exam)  You may take any medications as prescribed with a small amount of water, if necessary. If you take any of the following medications: METFORMIN , GLUCOPHAGE , GLUCOVANCE, AVANDAMET, RIOMET , FORTAMET , ACTOPLUS MET, JANUMET, GLUMETZA  or METAGLIP, you MAY be asked to HOLD this medication 48 hours AFTER the exam.  The purpose of you drinking the oral contrast is to aid in the visualization of your intestinal tract. The contrast solution may cause some diarrhea. Depending on your individual set of symptoms, you may also receive an intravenous injection of x-ray contrast/dye. Plan on being at [facility] for 30 minutes or longer, depending on the type of exam you  are having performed.  This test typically takes 30-45 minutes to complete.  If you have any questions regarding your exam or if you need to reschedule, you may call the CT department at [phone] between the hours of 8:00 am and 5:00 pm, Monday-Friday. Contrast is only on  Thursday. Without contrast is everyday.

## 2024-01-18 NOTE — Progress Notes (Signed)
 Chief Complaint: GI problems  Referring Provider:  Amon Aloysius BRAVO, MD      ASSESSMENT AND PLAN;   #1. Lower abdo pain with bloating and tenderness  #2. IBS- D. Neg colon with Bx Feb 2019. Neg colon 12/2022.  Plan: -Bentyl  20mg  po TID  #120, 11 RF.  Take half an hour before meals and bedtime -CT AP with PO and IV contrast. -CBC, CMP, CRP, TSH -Continue activia.   HPI:    Danny [name] is a 63 y.o. male  A Fib on Eliquis , HTN, DM2, BPH, HLD, obesity, IBS-D  Discussed the use of AI scribe software for clinical note transcription with the patient, who gave verbal consent to proceed.  History of Present Illness Danny [name] is a 63 year old male with diverticulosis who presents with worsening left lower quadrant abdominal pain.  He has experienced left lower quadrant abdominal pain for many years, described as 'somebody taking a knot and sticking it in me.' Recently, the pain has worsened, is intermittent, and is relieved by bowel movements. Initially, bowel movements are normal but can progress to diarrhea. No fever or chills accompany the pain.  He takes Bentyl  (dicyclomine ) 10 mg capsules three times a day, but it is less effective than before. He also consumes Activia yogurt, which he finds beneficial; discontinuing it worsens his symptoms. He occasionally uses Miralax, approximately every other day, to relieve bloating and gas pressure, taking a capful (70 grams) as needed.  His past medical history includes diverticulosis, confirmed by a colonoscopy last year and a CT scan in 2023. A colonoscopy in 2019 showed similar findings. He has a history of atrial fibrillation and is on Eliquis , a blood thinner. He mentions a small hernia near his belly button, and recalls being told by Dr. Algie that it does not need to be fixed unless it becomes bothersome.  He denies any specific foods that exacerbate or alleviate his symptoms, stating 'I can eat anything.' He experiences steady  pain in his stomach, which can become severe enough to require him to pull over while driving. Certain sitting positions can exacerbate the pain, describing it as 'like nails.'  He has a history of taking antibiotics following a toenail removal, which he believes contributed to his stomach issues, leading him to start Activia. His swallowing has been improving.   He does give history suggestive of lactose intolerance.  No history suggestive of gluten intolerance.  No jaundice dark urine or pale stools.  No weight loss.  No fever chills or night sweats.   Previous GI work-up: EGD August 2022 for epigastric pain at [facility]  -Mild gastritis, irregular Z-line. -S/p biopsies distal esophagus and gastric.  We do not have biopsy results. -Report sent for scanning.  Colon 12/2022 - Minimal sigmoid diverticulosis. - Non- bleeding internal hemorrhoids. - The examined portion of the ileum was normal. - The examination was otherwise normal on direct and retroflexion views. - Rpt 10 yrs  EGD 05/2022 - Benign- appearing esophageal stenosis. Dilated. - Z- line irregular, 40 cm from the incisors. Biopsied. - Gastritis. Biopsied. - Normal examined duodenum. - Biopsies were taken with a cold forceps for evaluation of eosinophilic esophagitis.   Colonoscopy 08/07/2017 at [facility]. (Indications-history of polyps, left lower quadrant abdominal pain, diarrhea) -Second-degree hemorrhoids -Otherwise normal colonoscopy -Negative random colon biopsies for microscopic colitis. -Repeat in 5 years -Report sent for scanning.  Neg celiac 2023  Past Medical History:  Diagnosis Date   Allergic rhinitis  Benign prostatic hypertrophy    s/p TUNA procedure aprx 2006,   Carpal tunnel syndrome 06/17/2009   Diabetes mellitus    Type II dx 1999   DM type 2 with diabetic peripheral neuropathy (HCC) 10/15/2014   ED (erectile dysfunction)    After TUNA   Elevated LFTs 2010   (-) chronic hep  panel   History  of cardiovascular stress test 8/11   Negative   Hyperlipidemia    HYPERSOMNIA UNSPECIFIED 12/11/2007   Hypertension    Obesity    PAF (paroxysmal atrial fibrillation) (HCC) 12/21/2009   a. Failed Multaq. b. Prev on flecainide . c. s/p afib ablation 2015.    Past Surgical History:  Procedure Laterality Date   ABLATION  03/24/2014   PVI by Dr Kelsie   ATRIAL FIBRILLATION ABLATION N/A 03/24/2014   Procedure: ATRIAL FIBRILLATION ABLATION;  Surgeon: Lynwood JONETTA Kelsie, MD;  Location: [facility] CATH LAB;  Service: Cardiovascular;  Laterality: N/A;   COLONOSCOPY  11/25/2012   Mild sigmoid diverticulosis, small internal hemorrhoids, otherwise normal colonoscopy   CYSTOSCOPY  04/26/2008   neg  @ urology, was rx Flomax    LEFT HEART CATH AND CORONARY ANGIOGRAPHY N/A 08/30/2021   Procedure: LEFT HEART CATH AND CORONARY ANGIOGRAPHY;  Surgeon: Burnard Debby LABOR, MD;  Location: [facility] INVASIVE CV LAB;  Service: Cardiovascular;  Laterality: N/A;   PROSTATE SURGERY     TUNA   TEE WITHOUT CARDIOVERSION N/A 03/23/2014   Procedure: TRANSESOPHAGEAL ECHOCARDIOGRAM (TEE);  Surgeon: Vina Okey GAILS, MD;  Location[facility] ENDOSCOPY;  Service: Cardiovascular;  Laterality: N/A;   VASECTOMY     several years ago    Family History  Problem Relation Age of Onset   Heart disease Mother    Diabetes Mother    Hypertension Mother    Congestive Heart Failure Mother    Heart attack Mother    Hypertension Father    Colon cancer Neg Hx    Prostate cancer Neg Hx    Stomach cancer Neg Hx    Rectal cancer Neg Hx     Social History   Tobacco Use   Smoking status: Former    Current packs/day: 0.00    Types: Cigarettes    Quit date: 06/26/1985    Years since quitting: 38.5   Smokeless tobacco: Never   Tobacco comments:    Former smoker, quit 1987  Vaping Use   Vaping status: Never Used  Substance Use Topics   Alcohol use: No    Comment:     Drug use: No    Current Outpatient Medications  Medication Sig Dispense Refill    acetaminophen  (TYLENOL ) 650 MG CR tablet Take 650 mg by mouth every 8 (eight) hours as needed for pain.     azelastine  (ASTELIN ) 0.1 % nasal spray Place 2 sprays into both nostrils 2 (two) times daily. Use in each nostril as directed 30 mL 5   BD VEO INSULIN  SYRINGE U/F 31G X 15/64 0.5 ML MISC USE 1 SYRINGE AS DIRECTED 3 TO 4 TIMES DAILY 100 each 0   Blood Glucose Monitoring Suppl (ONETOUCH VERIO REFLECT) w/Device KIT 1 1 kit 0   cephALEXin  (KEFLEX ) 500 MG capsule Take 1 capsule (500 mg total) by mouth 3 (three) times daily. 30 capsule 2   Continuous Glucose Sensor (DEXCOM G7 SENSOR) MISC CHANGE EVERY 10 DAYS 9 each 3   Dapagliflozin  Pro-metFORMIN  ER (XIGDUO  XR) 10-998 MG TB24 Take 1 tablet by mouth 2 (two) times daily. 180 tablet 1   dicyclomine  (  BENTYL ) 10 MG capsule Take 1 capsule (10 mg total) by mouth 4 (four) times daily -  before meals and at bedtime. 120 capsule 6   ELIQUIS  5 MG TABS tablet TAKE 1 TABLET(5 MG) BY MOUTH TWICE DAILY 60 tablet 5   fluticasone  (FLONASE ) 50 MCG/ACT nasal spray Place 2 sprays into both nostrils daily as needed for allergies or rhinitis. 16 g 5   Glucagon  (BAQSIMI  ONE PACK) 3 MG/DOSE POWD To be sprayed in nostril as needed for severe hypoglycemia 1 each 1   glucose blood (ONETOUCH VERIO) test strip USE AS DIRECTED TO CHECK BLOOD SUGAR 4 TIMES DAILY 125 each 2   hydrochlorothiazide  (MICROZIDE ) 12.5 MG capsule Take 1 capsule by mouth once daily 30 capsule 0   irbesartan  (AVAPRO ) 75 MG tablet Take 1 tablet (75 mg total) by mouth daily. 30 tablet 5   Lancets (ONETOUCH DELICA PLUS LANCET33G) MISC Use to check blood sugar 4 times per day 200 each 2   loratadine  (CLARITIN ) 10 MG tablet Take 10 mg by mouth daily.     meclizine (ANTIVERT) 25 MG tablet Take 25 mg by mouth as needed.     Multiple Vitamins-Minerals (MULTIVITAMIN WITH MINERALS) tablet Take 1 tablet by mouth daily.     mupirocin  ointment (BACTROBAN ) 2 % Apply 1 Application topically 2 (two) times daily. 22 g 0    nitroGLYCERIN  (NITROSTAT ) 0.4 MG SL tablet DISSOLVE ONE TABLET UNDER THE TONGUE EVERY 5 MINUTES AS NEEDED FOR CHEST PAIN.  DO NOT EXCEED A TOTAL OF 3 DOSES IN 15 MINUTES 25 tablet 0   NOVOLOG  FLEXPEN 100 UNIT/ML FlexPen INJECT 10 TO 12 UNITS SUBCUTANEOUSLY THREE TIMES DAILY BEFORE MEAL(S) 45 mL 3   omega-3 acid ethyl esters (LOVAZA) 1 g capsule Take by mouth.     ondansetron  (ZOFRAN -ODT) 4 MG disintegrating tablet Take 1 tablet (4 mg total) by mouth every 8 (eight) hours as needed for nausea or vomiting. 20 tablet 0   pantoprazole  (PROTONIX ) 40 MG tablet TAKE 1 TABLET BY MOUTH ONCE DAILY 30  MINUTES  PRIOR  TO  EATING 90 tablet 0   Polyethyl Glyc-Propyl Glyc PF 0.4-0.3 % SOLN Apply 2 drops to eye 3 (three) times daily as needed (for dry eyes).     pregabalin  (LYRICA ) 100 MG capsule Take 1 capsule (100 mg total) by mouth 3 (three) times daily. 90 capsule 3   Probiotic Product (PROBIOTIC DAILY PO) Take 1 capsule by mouth daily.     rosuvastatin  (CRESTOR ) 40 MG tablet Take 1 tablet by mouth once daily 90 tablet 3   Semaglutide , 2 MG/DOSE, 8 MG/3ML SOPN Inject 2 mg as directed once a week. 9 mL 3   silodosin (RAPAFLO) 8 MG CAPS capsule Take 8 mg by mouth daily with breakfast.     TRESIBA  FLEXTOUCH 100 UNIT/ML FlexTouch Pen INJECT 46 UNITS SUBCUTANEOUSLY ONCE DAILY 30 mL 4   triamcinolone cream (KENALOG) 0.1 % Apply 1 Application topically as needed.     No current facility-administered medications for this visit.    No Known Allergies  Review of Systems:  neg Psychiatric/Behavioral: No anxiety or depression.  Has sleeping problems.     Physical Exam:    BP 132/70   Pulse 67   Ht 5' 11 (1.803 m)   Wt 224 lb (101.6 kg)   BMI 31.24 kg/m  Wt Readings from Last 3 Encounters:  01/18/24 224 lb (101.6 kg)  10/29/23 224 lb (101.6 kg)  10/16/23 223 lb 8 oz (101.4 kg)  Constitutional:  Well-developed, in no acute distress. Psychiatric: Normal mood and affect. Behavior is normal. HEENT:  Pupils normal.  Conjunctivae are normal. No scleral icterus. Cardiovascular: Normal rate, regular rhythm. No edema Pulmonary/chest: Effort normal and breath sounds normal. No wheezing, rales or rhonchi. Abdominal: Soft, nondistended. Nontender. Bowel sounds active throughout. There are no masses palpable. No hepatomegaly.  Small umbilical hernia.  Divarication of recti. Rectal: Deferred Neurological: Alert and oriented to person place and time. Skin: Skin is warm and dry. No rashes noted.  Data Reviewed: I have personally reviewed following labs and imaging studies  CBC:    Latest Ref Rng & Units 02/22/2023    8:32 AM 10/09/2022    3:20 PM 05/17/2022   10:34 AM  CBC  WBC 3.4 - 10.8 x10E3/uL 5.2  6.8  6.8   Hemoglobin 13.0 - 17.7 g/dL 85.0  84.7  84.8   Hematocrit 37.5 - 51.0 % 46.1  45.1  44.7   Platelets 150 - 450 x10E3/uL 251  243.0  240     CMP:    Latest Ref Rng & Units 10/16/2023    8:15 AM 08/06/2023    8:13 AM 04/09/2023    8:48 AM  CMP  Glucose 70 - 99 mg/dL 858  859  880   BUN 8 - 27 mg/dL 12  15  9    Creatinine 0.76 - 1.27 mg/dL 9.08  9.13  9.17   Sodium 134 - 144 mmol/L 138  140  141   Potassium 3.5 - 5.2 mmol/L 4.8  5.0  4.4   Chloride 96 - 106 mmol/L 100  102  105   CO2 20 - 29 mmol/L 23  31  28    Calcium  8.6 - 10.2 mg/dL 9.3  9.9  9.3       Anselm Bring, MD 01/18/2024, 1:59 PM  Cc: Amon Aloysius BRAVO, MD

## 2024-01-22 ENCOUNTER — Ambulatory Visit: Payer: Self-pay | Admitting: Gastroenterology

## 2024-01-22 NOTE — Progress Notes (Signed)
 HPI: FU atrial fibrillation. He previously failed Multaq.  Patient had ablation of his atrial fibrillation in September 2015 by Dr. Kelsie.  Monitor February 2023 showed no atrial fibrillation and rare supraventricular and ventricular ectopy.  Cardiac CTA February 2023 showed calcium  score 2233 which was 99th percentile.  Carotid Dopplers February 2023 showed no significant stenosis.  Echocardiogram February 2023 showed normal LV function.  Cardiac catheterization March 2023 showed 40% mid RCA, 50% mid LAD and mildly elevated left ventricular end-diastolic pressure; ejection fraction 55 to 65%.  Since he was last seen, patient has had occasional dizzy spells recently.  They can occur while sitting down lasting 30 to 45 seconds.  No frank syncope.  No associated chest pain or palpitations.  He otherwise denies dyspnea on exertion, orthopnea, PND, chest pain.  He has had minimal pedal edema towards the latter part of each day improved with elevating his legs.  Current Outpatient Medications  Medication Sig Dispense Refill   acetaminophen  (TYLENOL ) 650 MG CR tablet Take 650 mg by mouth every 8 (eight) hours as needed for pain.     azelastine  (ASTELIN ) 0.1 % nasal spray Place 2 sprays into both nostrils 2 (two) times daily. Use in each nostril as directed 30 mL 5   BD VEO INSULIN  SYRINGE U/F 31G X 15/64 0.5 ML MISC USE 1 SYRINGE AS DIRECTED 3 TO 4 TIMES DAILY 100 each 0   Blood Glucose Monitoring Suppl (ONETOUCH VERIO REFLECT) w/Device KIT 1 1 kit 0   Continuous Glucose Sensor (DEXCOM G7 SENSOR) MISC CHANGE EVERY 10 DAYS 9 each 3   Dapagliflozin  Pro-metFORMIN  ER (XIGDUO  XR) 10-998 MG TB24 Take 1 tablet by mouth 2 (two) times daily. 180 tablet 1   dicyclomine  (BENTYL ) 20 MG tablet Take 1 tablet (20 mg total) by mouth 4 (four) times daily -  before meals and at bedtime. 120 tablet 11   ELIQUIS  5 MG TABS tablet TAKE 1 TABLET(5 MG) BY MOUTH TWICE DAILY 60 tablet 5   fluticasone  (FLONASE ) 50 MCG/ACT  nasal spray Place 2 sprays into both nostrils daily as needed for allergies or rhinitis. 16 g 5   Glucagon  (BAQSIMI  ONE PACK) 3 MG/DOSE POWD To be sprayed in nostril as needed for severe hypoglycemia 1 each 1   glucose blood (ONETOUCH VERIO) test strip USE AS DIRECTED TO CHECK BLOOD SUGAR 4 TIMES DAILY 125 each 2   hydrochlorothiazide  (MICROZIDE ) 12.5 MG capsule Take 1 capsule by mouth once daily 30 capsule 0   irbesartan  (AVAPRO ) 75 MG tablet Take 1 tablet (75 mg total) by mouth daily. 30 tablet 5   Lancets (ONETOUCH DELICA PLUS LANCET33G) MISC Use to check blood sugar 4 times per day 200 each 2   loratadine  (CLARITIN ) 10 MG tablet Take 10 mg by mouth daily.     meclizine (ANTIVERT) 25 MG tablet Take 25 mg by mouth as needed.     Multiple Vitamins-Minerals (MULTIVITAMIN WITH MINERALS) tablet Take 1 tablet by mouth daily.     nitroGLYCERIN  (NITROSTAT ) 0.4 MG SL tablet DISSOLVE ONE TABLET UNDER THE TONGUE EVERY 5 MINUTES AS NEEDED FOR CHEST PAIN.  DO NOT EXCEED A TOTAL OF 3 DOSES IN 15 MINUTES 25 tablet 0   NOVOLOG  FLEXPEN 100 UNIT/ML FlexPen INJECT 10 TO 12 UNITS SUBCUTANEOUSLY THREE TIMES DAILY BEFORE MEAL(S) 45 mL 3   omega-3 acid ethyl esters (LOVAZA) 1 g capsule Take by mouth.     ondansetron  (ZOFRAN -ODT) 4 MG disintegrating tablet Take 1 tablet (4  mg total) by mouth every 8 (eight) hours as needed for nausea or vomiting. 20 tablet 0   pantoprazole  (PROTONIX ) 40 MG tablet TAKE 1 TABLET BY MOUTH ONCE DAILY 30  MINUTES  PRIOR  TO  EATING 90 tablet 0   Polyethyl Glyc-Propyl Glyc PF 0.4-0.3 % SOLN Apply 2 drops to eye 3 (three) times daily as needed (for dry eyes).     pregabalin  (LYRICA ) 100 MG capsule Take 1 capsule (100 mg total) by mouth 3 (three) times daily. 90 capsule 3   Probiotic Product (PROBIOTIC DAILY PO) Take 1 capsule by mouth daily.     rosuvastatin  (CRESTOR ) 40 MG tablet Take 1 tablet by mouth once daily 90 tablet 3   Semaglutide , 2 MG/DOSE, (OZEMPIC , 2 MG/DOSE,) 8 MG/3ML SOPN  INJECT 1 DOSE SUBCUTANEOUSLY ONCE A WEEK AS DIRECTED 9 mL 3   silodosin (RAPAFLO) 8 MG CAPS capsule Take 8 mg by mouth daily with breakfast.     TRESIBA  FLEXTOUCH 100 UNIT/ML FlexTouch Pen INJECT 46 UNITS SUBCUTANEOUSLY ONCE DAILY 30 mL 4   triamcinolone cream (KENALOG) 0.1 % Apply 1 Application topically as needed.     cephALEXin  (KEFLEX ) 500 MG capsule Take 1 capsule (500 mg total) by mouth 3 (three) times daily. 30 capsule 2   dicyclomine  (BENTYL ) 10 MG capsule Take 1 capsule (10 mg total) by mouth 4 (four) times daily -  before meals and at bedtime. 120 capsule 6   mupirocin  ointment (BACTROBAN ) 2 % Apply 1 Application topically 2 (two) times daily. 22 g 0   No current facility-administered medications for this visit.     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 Failed Multaq. b. Prev on flecainide . c. s/p afib ablation 2015.    Past Surgical History:  Procedure Laterality Date   ABLATION  03/24/2014   PVI by Dr Kelsie   ATRIAL FIBRILLATION ABLATION N/A 03/24/2014   Procedure: ATRIAL FIBRILLATION ABLATION;  Surgeon: Lynwood JONETTA Kelsie, MD;  Location: [facility] CATH [facility];  Service: Cardiovascular;  Laterality: N/A;   COLONOSCOPY  11/25/2012   Mild sigmoid diverticulosis, small internal hemorrhoids, otherwise normal colonoscopy   CYSTOSCOPY  04/26/2008   neg  @ urology, was rx Flomax    LEFT HEART CATH AND CORONARY ANGIOGRAPHY N/A 08/30/2021   Procedure: LEFT HEART CATH AND CORONARY  ANGIOGRAPHY;  Surgeon: Burnard Debby LABOR, MD;  Location: [facility] INVASIVE CV [facility];  Service: Cardiovascular;  Laterality: N/A;   PROSTATE SURGERY     TUNA   TEE WITHOUT CARDIOVERSION N/A 03/23/2014   Procedure: TRANSESOPHAGEAL ECHOCARDIOGRAM (TEE);  Surgeon: Vina Okey GAILS, MD;  Location: [facility] ENDOS[facility];  Service: Cardiovascular;  Laterality: N/A;   VASECTOMY     several years ago    Social History   Socioeconomic History   Marital status: Married    Spouse name: Not on file   Number of children: 3   Years  of education: GED   Highest education level: GED or equivalent  Occupational History   Occupation: Works for [employer] (state).  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[employer]    Comment: works for [employer] (state)  Tobacco Use   Smoking status: Former    Current packs/day: 0.00    Types: Cigarettes    Quit date: 06/26/1985    Years since quitting: 38.6   Smokeless tobacco: Never   Tobacco comments:    Former smoker, quit 1987  Vaping Use   Vaping status: Never Used  Substance and Sexual Activity   Alcohol use: No    Comment:     Drug use: No   Sexual activity: Not on file  Other Topics Concern   Not on file  Social History Narrative   Lives with wife in a one story   Right handed    One story home   3 children         Social Drivers of Health   Financial Resource Strain: High Risk (08/21/2023)   Overall Financial Resource Strain (CARDIA)    Difficulty of Paying Living Expenses: Very hard  Food Insecurity: No Food Insecurity (08/21/2023)   Hunger Vital Sign    Worried About Running Out of Food in the Last Year: Never true    Ran Out of Food in the Last Year: Never true  Transportation Needs: No Transportation Needs (08/21/2023)   PRAPARE - Administrator, Civil Service (Medical): No    Lack of Transportation (Non-Medical): No  Physical Activity: Sufficiently Active (08/21/2023)   Exercise Vital Sign    Days of Exercise per Week: 5 days    Minutes of Exercise per Session:  60 min  Stress: Stress Concern Present (08/21/2023)   [employer] of Occupational Health - Occupational Stress Questionnaire    Feeling of Stress : To some extent  Social Connections: Socially Integrated (08/21/2023)   Social Connection and Isolation Panel    Frequency of Communication with Friends and Family: More than three times a week    Frequency of Social Gatherings with Friends and Family: Once a week    Attends Religious Services: More than 4 times per year    Active Member of [employer] or Organizations: Yes    Attends Engineer, structural: More than 4 times per year    Marital Status: Married  Catering manager Violence: Not on file    Family History  Problem Relation Age of Onset   Heart disease Mother    Diabetes Mother    Hypertension Mother    Congestive Heart Failure Mother    Heart attack Mother    Hypertension Father    Colon cancer Neg Hx    Prostate cancer Neg Hx    Stomach cancer Neg Hx    Rectal cancer Neg Hx     ROS: no fevers or chills, productive cough, hemoptysis, dysphasia, odynophagia, melena, hematochezia, dysuria, hematuria, rash, seizure activity, orthopnea, PND, pedal edema, claudication. Remaining systems are negative.  Physical Exam: Well-developed well-nourished in no acute distress.  Skin is warm and dry.  HEENT is normal.  Neck is supple.  Chest is clear to auscultation with normal expansion.  Cardiovascular exam is regular rate and rhythm.  Abdominal exam nontender or distended. No masses palpated. Extremities show no edema. neuro grossly intact  EKG Interpretation Date/Time:  Tuesday February 05 2024 08:05:10 EDT Ventricular Rate:  66 PR Interval:  144 QRS Duration:  86 QT Interval:  402 QTC Calculation: 421 R Axis:   37  Text Interpretation: Normal sinus rhythm Normal ECG Confirmed by Pietro Rogue ([id number]) on 02/05/2024 8:06:15 AM    A/P  1 paroxysmal atrial fibrillation-patient is status post ablation with no  documented recurrences.  Remains in sinus rhythm on electrocardiogram today.  Continue apixaban .  2 history of nonobstructive coronary artery disease-he denies chest pain.  Continue statin.  3 hypertension-patient's blood pressure is controlled.  However he is having dizzy spells.  There is some component of dizziness with changing positions but he has also had episodes while sitting down.  Etiology unclear.  Blood pressure is controlled but borderline today.  I will discontinue Avapro  to see if this improves his symptoms.  If he has recurrences despite the above will consider monitor.  4 hyperlipidemia-continue statin. Check lipids.   Rogue Pietro, MD

## 2024-01-23 ENCOUNTER — Ambulatory Visit: Admitting: Endocrinology

## 2024-01-23 ENCOUNTER — Ambulatory Visit: Payer: Self-pay | Admitting: Endocrinology

## 2024-01-23 ENCOUNTER — Encounter: Payer: Self-pay | Admitting: Endocrinology

## 2024-01-23 VITALS — BP 122/60 | HR 70 | Resp 20 | Ht 71.0 in | Wt 224.4 lb

## 2024-01-23 DIAGNOSIS — E1140 Type 2 diabetes mellitus with diabetic neuropathy, unspecified: Secondary | ICD-10-CM

## 2024-01-23 DIAGNOSIS — Z794 Long term (current) use of insulin: Secondary | ICD-10-CM

## 2024-01-23 LAB — POCT GLYCOSYLATED HEMOGLOBIN (HGB A1C): Hemoglobin A1C: 6.9 % — AB (ref 4.0–5.6)

## 2024-01-23 NOTE — Progress Notes (Signed)
 Outpatient Endocrinology Note [place] Lovie Agresta, MD  01/23/24  Patient's Name: Danny [name]    DOB: 1961-05-16    MRN: [id number]                                                    REASON OF VISIT: Follow up for type 2 diabetes mellitus  PCP: Danny Aloysius BRAVO, MD  HISTORY OF PRESENT ILLNESS:   Danny [name] is a 63 y.o. old male with past medical history listed below, is here for follow up of type 2 diabetes mellitus.   Pertinent Diabetes History: _Diagnosed as Diabetes Mellitus type 2  in 1992. Insulin  therapy was started in 2011.   Chronic Diabetes Complications : Retinopathy: no. Last ophthalmology exam was done on annually.  Nephropathy: no, on irbesartan  Peripheral neuropathy: yes, numbness, tingling, followed by neurology, on lyrica . Coronary artery disease: no Stroke: no  Relevant comorbidities and cardiovascular risk factors: Obesity: yes Body mass index is 31.3 kg/m.  Hypertension: yes Hyperlipidemia. Yes, on statin.   Current / Home Diabetic regimen includes: Tresiba  46 units daily.  Ozempic  2 mg weekly.  Xigduo  XR 10/998 1 tab twice a day.  Novolog  15-20 units 2-3 times a day, with meals.  Sometimes he does not take NovoLog  if blood sugar is low or or if he is physically active.  Prior diabetic medications: Metformin , stopped due to fear of liver problems. Januvia, Amaryl, Actos.   Glycemic data:    CONTINUOUS GLUCOSE MONITORING SYSTEM (CGMS) INTERPRETATION: At today's visit, we reviewed CGM downloads. The full report is scanned in the media. Reviewing the CGM trends, blood glucose are as follows:  Dexcom G7 CGM-  Sensor Download (Sensor download was reviewed and summarized below.) Dates: July 17 to January 23, 2024 for 14 days.  Glucose Management Indicator: 6.9%     Interpretation: Mostly acceptable blood sugar with occasional mild hyperglycemia postprandially in the range of 200s.  No hypoglycemia.  Hypoglycemia: Patient has no hypoglycemic episodes.  Patient has hypoglycemia awareness.  Factors modifying glucose control: 1.  Diabetic diet assessment: 3 meals a day.   2.  Staying active or exercising:   3.  Medication compliance: compliant all of the time.  Interval history  Hemoglobin A1c 6.9%.  CGM data as reviewed above.  Diabetes regimen as reviewed and noted above.  He has complaints of numbness and tingling of the feet and taking Lyrica  and following with neurology.  No other complaints today.  REVIEW OF SYSTEMS As per history of present illness.   PAST MEDICAL HISTORY: Past Medical History:  Diagnosis Date   Allergic rhinitis    Benign prostatic hypertrophy    s/p TUNA procedure aprx 2006,   Carpal tunnel syndrome 06/17/2009   Diabetes mellitus    Type II dx 1999   DM type 2 with diabetic peripheral neuropathy (HCC) 10/15/2014   ED (erectile dysfunction)    After TUNA   Elevated LFTs 2010   (-) chronic hep  panel   History of cardiovascular stress test 8/11   Negative   Hyperlipidemia    HYPERSOMNIA UNSPECIFIED 12/11/2007   Hypertension    Obesity    PAF (paroxysmal atrial fibrillation) (HCC) 12/21/2009   a. Failed Multaq. b. Prev on flecainide . c. s/p afib ablation 2015.    PAST SURGICAL HISTORY: Past Surgical History:  Procedure Laterality  Date   ABLATION  03/24/2014   PVI by Dr [name]   ATRIAL FIBRILLATION ABLATION N/A 03/24/2014   Procedure: ATRIAL FIBRILLATION ABLATION;  Surgeon: Danny Danny Kelsie, MD;  Location: [facility] CATH LAB;  Service: Cardiovascular;  Laterality: N/A;   COLONOSCOPY  11/25/2012   Mild sigmoid diverticulosis, small internal hemorrhoids, otherwise normal colonoscopy   CYSTOSCOPY  04/26/2008   neg  @ urology, was rx Flomax    LEFT HEART CATH AND CORONARY ANGIOGRAPHY N/A 08/30/2021   Procedure: LEFT HEART CATH AND CORONARY ANGIOGRAPHY;  Surgeon: Danny Debby LABOR, MD;  Location: [facility] INVASIVE CV LAB;  Service: Cardiovascular;  Laterality: N/A;   PROSTATE SURGERY     TUNA   TEE WITHOUT  CARDIOVERSION N/A 03/23/2014   Procedure: TRANSESOPHAGEAL ECHOCARDIOGRAM (TEE);  Surgeon: Danny Danny GAILS, MD;  Location: [facility] ENDOSCOPY;  Service: Cardiovascular;  Laterality: N/A;   VASECTOMY     several years ago    ALLERGIES: No Known Allergies  FAMILY HISTORY:  Family History  Problem Relation Age of Onset   Heart disease Mother    Diabetes Mother    Hypertension Mother    Congestive Heart Failure Mother    Heart attack Mother    Hypertension Father    Colon cancer Neg Hx    Prostate cancer Neg Hx    Stomach cancer Neg Hx    Rectal cancer Neg Hx     SOCIAL HISTORY: Social History   Socioeconomic History   Marital status: Married    Spouse name: Not on file   Number of children: 3   Years of education: GED   Highest education level: GED or equivalent  Occupational History   Occupation: Works for [employer] (state).  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[employer]    Comment: works for [employer] (state)  Tobacco Use   Smoking status: Former    Current packs/day: 0.00    Types: Cigarettes    Quit date: 06/26/1985    Years since quitting: 38.6   Smokeless tobacco: Never   Tobacco comments:    Former smoker, quit 1987  Vaping Use   Vaping status: Never Used  Substance and Sexual Activity   Alcohol use: No    Comment:     Drug use: No   Sexual activity: Not on file  Other Topics Concern   Not on file  Social History Narrative   Lives with wife in a one story   Right handed    One story home   3 children         Social Drivers of Health   Financial Resource Strain: High Risk (08/21/2023)   Overall Financial Resource Strain (CARDIA)    Difficulty of Paying Living Expenses: Very hard  Food Insecurity: No Food Insecurity (08/21/2023)   Hunger Vital Sign    Worried About Running Out of Food in the Last Year: Never true    Ran Out of Food in the Last Year: Never true  Transportation Needs: No Transportation Needs (08/21/2023)   PRAPARE - Scientist, research (physical sciences) (Medical): No    Lack of Transportation (Non-Medical): No  Physical Activity: Sufficiently Active (08/21/2023)   Exercise Vital Sign    Days of Exercise per Week: 5 days    Minutes of Exercise per Session: 60 min  Stress: Stress Concern Present (08/21/2023)   [employer] of Occupational Health - Occupational Stress Questionnaire    Feeling of Stress : To some extent  Social Connections:  Socially Integrated (08/21/2023)   Social Connection and Isolation Panel    Frequency of Communication with Friends and Family: More than three times a week    Frequency of Social Gatherings with Friends and Family: Once a week    Attends Religious Services: More than 4 times per year    Active Member of [employer] or Organizations: Yes    Attends Engineer, structural: More than 4 times per year    Marital Status: Married    MEDICATIONS:  Current Outpatient Medications  Medication Sig Dispense Refill   acetaminophen  (TYLENOL ) 650 MG CR tablet Take 650 mg by mouth every 8 (eight) hours as needed for pain.     azelastine  (ASTELIN ) 0.1 % nasal spray Place 2 sprays into both nostrils 2 (two) times daily. Use in each nostril as directed 30 mL 5   BD VEO INSULIN  SYRINGE U/F 31G X 15/64 0.5 ML MISC USE 1 SYRINGE AS DIRECTED 3 TO 4 TIMES DAILY 100 each 0   Blood Glucose Monitoring Suppl (ONETOUCH VERIO REFLECT) w/Device KIT 1 1 kit 0   cephALEXin  (KEFLEX ) 500 MG capsule Take 1 capsule (500 mg total) by mouth 3 (three) times daily. 30 capsule 2   Continuous Glucose Sensor (DEXCOM G7 SENSOR) MISC CHANGE EVERY 10 DAYS 9 each 3   Dapagliflozin  Pro-metFORMIN  ER (XIGDUO  XR) 10-998 MG TB24 Take 1 tablet by mouth 2 (two) times daily. 180 tablet 1   dicyclomine  (BENTYL ) 20 MG tablet Take 1 tablet (20 mg total) by mouth 4 (four) times daily -  before meals and at bedtime. 120 tablet 11   ELIQUIS  5 MG TABS tablet TAKE 1 TABLET(5 MG) BY MOUTH TWICE DAILY 60 tablet 5   fluticasone  (FLONASE ) 50 MCG/ACT  nasal spray Place 2 sprays into both nostrils daily as needed for allergies or rhinitis. 16 g 5   Glucagon  (BAQSIMI  ONE PACK) 3 MG/DOSE POWD To be sprayed in nostril as needed for severe hypoglycemia 1 each 1   glucose blood (ONETOUCH VERIO) test strip USE AS DIRECTED TO CHECK BLOOD SUGAR 4 TIMES DAILY 125 each 2   hydrochlorothiazide  (MICROZIDE ) 12.5 MG capsule Take 1 capsule by mouth once daily 30 capsule 0   irbesartan  (AVAPRO ) 75 MG tablet Take 1 tablet (75 mg total) by mouth daily. 30 tablet 5   Lancets (ONETOUCH DELICA PLUS LANCET33G) MISC Use to check blood sugar 4 times per day 200 each 2   loratadine  (CLARITIN ) 10 MG tablet Take 10 mg by mouth daily.     meclizine (ANTIVERT) 25 MG tablet Take 25 mg by mouth as needed.     Multiple Vitamins-Minerals (MULTIVITAMIN WITH MINERALS) tablet Take 1 tablet by mouth daily.     mupirocin  ointment (BACTROBAN ) 2 % Apply 1 Application topically 2 (two) times daily. 22 g 0   nitroGLYCERIN  (NITROSTAT ) 0.4 MG SL tablet DISSOLVE ONE TABLET UNDER THE TONGUE EVERY 5 MINUTES AS NEEDED FOR CHEST PAIN.  DO NOT EXCEED A TOTAL OF 3 DOSES IN 15 MINUTES 25 tablet 0   NOVOLOG  FLEXPEN 100 UNIT/ML FlexPen INJECT 10 TO 12 UNITS SUBCUTANEOUSLY THREE TIMES DAILY BEFORE MEAL(S) 45 mL 3   omega-3 acid ethyl esters (LOVAZA) 1 g capsule Take by mouth.     ondansetron  (ZOFRAN -ODT) 4 MG disintegrating tablet Take 1 tablet (4 mg total) by mouth every 8 (eight) hours as needed for nausea or vomiting. 20 tablet 0   pantoprazole  (PROTONIX ) 40 MG tablet TAKE 1 TABLET BY MOUTH ONCE DAILY 30  MINUTES  PRIOR  TO  EATING 90 tablet 0   Polyethyl Glyc-Propyl Glyc PF 0.4-0.3 % SOLN Apply 2 drops to eye 3 (three) times daily as needed (for dry eyes).     pregabalin  (LYRICA ) 100 MG capsule Take 1 capsule (100 mg total) by mouth 3 (three) times daily. 90 capsule 3   Probiotic Product (PROBIOTIC DAILY PO) Take 1 capsule by mouth daily.     rosuvastatin  (CRESTOR ) 40 MG tablet Take 1 tablet by  mouth once daily 90 tablet 3   Semaglutide , 2 MG/DOSE, 8 MG/3ML SOPN Inject 2 mg as directed once a week. 9 mL 3   silodosin (RAPAFLO) 8 MG CAPS capsule Take 8 mg by mouth daily with breakfast.     TRESIBA  FLEXTOUCH 100 UNIT/ML FlexTouch Pen INJECT 46 UNITS SUBCUTANEOUSLY ONCE DAILY 30 mL 4   triamcinolone cream (KENALOG) 0.1 % Apply 1 Application topically as needed.     dicyclomine  (BENTYL ) 10 MG capsule Take 1 capsule (10 mg total) by mouth 4 (four) times daily -  before meals and at bedtime. 120 capsule 6   No current facility-administered medications for this visit.    PHYSICAL EXAM: Vitals:   01/23/24 1410  BP: 122/60  Pulse: 70  Resp: 20  SpO2: 96%  Weight: 224 lb 6.4 oz (101.8 kg)  Height: 5' 11 (1.803 m)    Body mass index is 31.3 kg/m.  Wt Readings from Last 3 Encounters:  01/23/24 224 lb 6.4 oz (101.8 kg)  01/18/24 224 lb (101.6 kg)  10/29/23 224 lb (101.6 kg)    General: Well developed, well nourished male in no apparent distress.  HEENT: AT/[place], no external lesions.  Eyes: Conjunctiva clear and no icterus. Neck: Neck supple  Lungs: Respirations not labored Neurologic: Alert, oriented, normal speech Extremities / Skin: Dry.   Psychiatric: Does not appear depressed or anxious  Diabetic Foot Exam - Simple   No data filed    LABS Reviewed Lab Results  Component Value Date   HGBA1C 6.9 (A) 01/23/2024   HGBA1C 7.3 (H) 08/06/2023   HGBA1C 6.8 (H) 04/09/2023   No results found for: FRUCTOSAMINE Lab Results  Component Value Date   CHOL 67 (L) 02/22/2023   HDL 31 (L) 02/22/2023   LDLCALC 17 02/22/2023   LDLDIRECT 62.0 03/29/2021   TRIG 98 02/22/2023   CHOLHDL 2.2 02/22/2023   Lab Results  Component Value Date   MICRALBCREAT 0.1 03/25/2010   MICRALBCREAT 5.9 11/04/2008   Lab Results  Component Value Date   CREATININE 0.84 01/18/2024   Lab Results  Component Value Date   GFR 91.83 01/18/2024    ASSESSMENT / PLAN  1. Type 2 diabetes mellitus  with diabetic neuropathy, with long-term current use of insulin  (HCC)     Diabetes Mellitus type 2, complicated by diabetes neuropathy.  - Diabetic status / severity: uncontrolled.   Lab Results  Component Value Date   HGBA1C 6.9 (A) 01/23/2024    - Hemoglobin A1c goal : <6.5%  Mild postprandial hyperglycemia, discussed about limiting carbohydrate and portion control.  - Medications: see below, no change Continue Tresiba  46 units daily.  Continue Ozempic  2mg  weekly.  Xigduo  XR 10/998 1 tab twice a day.  Novolog  15-20 units 2-3 times a day, with meals.  Okay to adjust based on meal, blood sugar and increase physical activity/work.  - Home glucose testing: CGM and check as needed.  Dexcom G7. - Discussed/ Gave Hypoglycemia treatment plan.  # Consult : not required  at this time.   # Annual urine for microalbuminuria/ creatinine ratio, no microalbuminuria currently, continue ACE/ARB / irbesartan . Microalbumin Creatinine ratio in 03/2023 was normal.  Last  Lab Results  Component Value Date   MICRALBCREAT 0.1 03/25/2010    # Foot check nightly / neuropathy, continue lyrica .   # Annual dilated diabetic eye exams.   - Diet: Eat reasonable portion sizes to promote a healthy weight - Life style / activity / exercise: discussed.   2. Blood pressure  -  BP Readings from Last 1 Encounters:  01/23/24 122/60    - Control is in target.  - No change in current plans.  3. Lipid status / Hyperlipidemia - Last  Lab Results  Component Value Date   LDLCALC 17 02/22/2023   - Continue rosuvastatin  40mg  daily. Managed by cardiology.    Diagnoses and all orders for this visit:  Type 2 diabetes mellitus with diabetic neuropathy, with long-term current use of insulin  (HCC) -     POCT glycosylated hemoglobin (Hb A1C)    DISPOSITION Follow up in clinic in 4 months suggested.   All questions answered and patient verbalized understanding of the plan.  [place] Sandy Haye, MD [facility]  Endocrinology [facility] [address] [place], [address] [place], [place] [address] Phone # [phone]  At least part of this note was generated using voice recognition software. Inadvertent word errors may have occurred, which were not recognized during the proofreading process.

## 2024-01-24 ENCOUNTER — Telehealth: Payer: Self-pay | Admitting: Gastroenterology

## 2024-01-24 NOTE — Telephone Encounter (Signed)
 Patient's wife called requesting a call to discuss holding medication after CT scan. Please advise, thank you

## 2024-01-25 ENCOUNTER — Ambulatory Visit (INDEPENDENT_AMBULATORY_CARE_PROVIDER_SITE_OTHER)
Admission: RE | Admit: 2024-01-25 | Discharge: 2024-01-25 | Disposition: A | Discharge status: Home / Self Care | Source: Ambulatory Visit | Attending: Gastroenterology | Admitting: Gastroenterology

## 2024-01-25 ENCOUNTER — Encounter: Payer: Self-pay | Admitting: Endocrinology

## 2024-01-25 DIAGNOSIS — R10819 Abdominal tenderness, unspecified site: Secondary | ICD-10-CM

## 2024-01-25 DIAGNOSIS — R1030 Lower abdominal pain, unspecified: Secondary | ICD-10-CM | POA: Diagnosis not present

## 2024-01-25 DIAGNOSIS — R140 Abdominal distension (gaseous): Secondary | ICD-10-CM

## 2024-01-25 MED ORDER — IOHEXOL 300 MG/ML  SOLN
100.0000 mL | Freq: Once | INTRAMUSCULAR | Status: AC | PRN
  Administered 2024-01-25: 100 mL via INTRAVENOUS

## 2024-01-25 MED ORDER — BARIUM SULFATE 2 % PO SUSP
450.0000 mL | Freq: Once | ORAL | Status: AC
  Administered 2024-01-25: 900 mL via ORAL

## 2024-01-25 NOTE — Telephone Encounter (Signed)
 Returned call to the patient who had questions about holding Xigduo  after CT scan.  Patient advised that any medications containing metformin  should be held after 48 hours after CT scan.  Patient informed that someone from radiology should notify him of specific medication holds after CT scan.  Patient agreed to plan and verbalized understanding.  No further questions.

## 2024-01-31 ENCOUNTER — Other Ambulatory Visit: Payer: Self-pay | Admitting: Endocrinology

## 2024-02-05 ENCOUNTER — Ambulatory Visit: Attending: Cardiology | Admitting: Cardiology

## 2024-02-05 ENCOUNTER — Encounter: Payer: Self-pay | Admitting: Cardiology

## 2024-02-05 VITALS — BP 116/73 | HR 66 | Ht 71.0 in | Wt 225.9 lb

## 2024-02-05 DIAGNOSIS — E785 Hyperlipidemia, unspecified: Secondary | ICD-10-CM

## 2024-02-05 DIAGNOSIS — I10 Essential (primary) hypertension: Secondary | ICD-10-CM | POA: Diagnosis not present

## 2024-02-05 DIAGNOSIS — I480 Paroxysmal atrial fibrillation: Secondary | ICD-10-CM

## 2024-02-05 DIAGNOSIS — I2510 Atherosclerotic heart disease of native coronary artery without angina pectoris: Secondary | ICD-10-CM

## 2024-02-05 NOTE — Patient Instructions (Signed)
 Medication Instructions:   STOP: Irbesartan  (Avapro )   Lab Work: None  Follow-Up: At [facility], you and your health needs are our priority.  As part of our continuing mission to provide you with exceptional heart care, our providers are all part of one team.  This team includes your primary Cardiologist (physician) and Advanced Practice Providers or APPs (Physician Assistants and Nurse Practitioners) who all work together to provide you with the care you need, when you need it.  Your next appointment:   1 year(s)  Provider:   Redell Shallow, MD    Other Instructions Please call us  or send a MyChart message with any Cardiology related questions/concerns.  [phone].  Thank you!

## 2024-02-14 ENCOUNTER — Other Ambulatory Visit: Payer: Self-pay | Admitting: Endocrinology

## 2024-02-17 ENCOUNTER — Other Ambulatory Visit: Payer: Self-pay | Admitting: Podiatry

## 2024-02-18 ENCOUNTER — Other Ambulatory Visit: Payer: Self-pay | Admitting: Lab

## 2024-02-19 ENCOUNTER — Ambulatory Visit: Payer: 59 | Admitting: Internal Medicine

## 2024-02-19 ENCOUNTER — Encounter: Payer: Self-pay | Admitting: Internal Medicine

## 2024-02-19 VITALS — BP 136/76 | HR 66 | Temp 97.8°F | Resp 16 | Ht 71.0 in | Wt 220.4 lb

## 2024-02-19 DIAGNOSIS — E118 Type 2 diabetes mellitus with unspecified complications: Secondary | ICD-10-CM | POA: Diagnosis not present

## 2024-02-19 DIAGNOSIS — Z794 Long term (current) use of insulin: Secondary | ICD-10-CM | POA: Diagnosis not present

## 2024-02-19 DIAGNOSIS — E785 Hyperlipidemia, unspecified: Secondary | ICD-10-CM | POA: Diagnosis not present

## 2024-02-19 DIAGNOSIS — I480 Paroxysmal atrial fibrillation: Secondary | ICD-10-CM

## 2024-02-19 DIAGNOSIS — Z23 Encounter for immunization: Secondary | ICD-10-CM | POA: Diagnosis not present

## 2024-02-19 LAB — MICROALBUMIN / CREATININE URINE RATIO
Creatinine,U: 35 mg/dL
Microalb Creat Ratio: 137.1 mg/g — ABNORMAL HIGH (ref 0.0–30.0)
Microalb, Ur: 4.8 mg/dL — ABNORMAL HIGH (ref 0.0–1.9)

## 2024-02-19 LAB — LIPID PANEL
Cholesterol: 59 mg/dL (ref 0–200)
HDL: 32.7 mg/dL — ABNORMAL LOW (ref >39.00)
LDL Cholesterol: -10 mg/dL — ABNORMAL LOW (ref 0–99)
NonHDL: 26.15
Total CHOL/HDL Ratio: 2
Triglycerides: 179 mg/dL — ABNORMAL HIGH (ref 0.0–149.0)
VLDL: 35.8 mg/dL (ref 0.0–40.0)

## 2024-02-19 NOTE — Patient Instructions (Signed)
 Vaccines are recommended Flu shot this fall A COVID booster     GO TO THE LAB :  Get the blood work   Your results will be posted on MyChart with my comments  Go to the front desk for the checkout Cancel the appointment in October and get a new appointment for a physical exam in 4 months.

## 2024-02-19 NOTE — Progress Notes (Unsigned)
 Subjective:    Patient ID: Danny [name] No, male    DOB: 1960/08/28, 63 y.o.   MRN: [id number]  DOS:  02/19/2024 Type of visit - description: ROV  Chronic medical problems addressed. Saw cardiology, chart reviewed. Saw Dr. Charlanne, GI, due to LLQ abdominal pain, CT was done.  Denies chest pain or difficulty breathing. He is anticoagulated, denies blood in the stools or in the urine.  Review of Systems See above   Past Medical History:  Diagnosis Date   Allergic rhinitis    Benign prostatic hypertrophy    s/p TUNA procedure aprx 2006,   Carpal tunnel syndrome 06/17/2009   Diabetes mellitus    Type II dx 1999   DM type 2 with diabetic peripheral neuropathy (HCC) 10/15/2014   ED (erectile dysfunction)    After TUNA   Elevated LFTs 2010   (-) chronic hep  panel   History of cardiovascular stress test 8/11   Negative   Hyperlipidemia    HYPERSOMNIA UNSPECIFIED 12/11/2007   Hypertension    Obesity    PAF (paroxysmal atrial fibrillation) (HCC) 12/21/2009   a. Failed Multaq. b. Prev on flecainide . c. s/p afib ablation 2015.    Past Surgical History:  Procedure Laterality Date   ABLATION  03/24/2014   PVI by Dr Kelsie   ATRIAL FIBRILLATION ABLATION N/A 03/24/2014   Procedure: ATRIAL FIBRILLATION ABLATION;  Surgeon: Lynwood JONETTA Kelsie, MD;  Location: [facility] CATH LAB;  Service: Cardiovascular;  Laterality: N/A;   COLONOSCOPY  11/25/2012   Mild sigmoid diverticulosis, small internal hemorrhoids, otherwise normal colonoscopy   CYSTOSCOPY  04/26/2008   neg  @ urology, was rx Flomax    LEFT HEART CATH AND CORONARY ANGIOGRAPHY N/A 08/30/2021   Procedure: LEFT HEART CATH AND CORONARY ANGIOGRAPHY;  Surgeon: Burnard Debby LABOR, MD;  Location: [facility] INVASIVE CV LAB;  Service: Cardiovascular;  Laterality: N/A;   PROSTATE SURGERY     TUNA   TEE WITHOUT CARDIOVERSION N/A 03/23/2014   Procedure: TRANSESOPHAGEAL ECHOCARDIOGRAM (TEE);  Surgeon: Vina Okey GAILS, MD;  Location: [facility] ENDOSCOPY;  Service:  Cardiovascular;  Laterality: N/A;   VASECTOMY     several years ago    Current Outpatient Medications  Medication Instructions   acetaminophen  (TYLENOL ) 650 mg, Every 8 hours PRN   azelastine  (ASTELIN ) 0.1 % nasal spray 2 sprays, Each Nare, 2 times daily, Use in each nostril as directed   BD VEO INSULIN  SYRINGE U/F 31G X 15/64 0.5 ML MISC USE 1 SYRINGE AS DIRECTED 3 TO 4 TIMES DAILY   Blood Glucose Monitoring Suppl (ONETOUCH VERIO REFLECT) w/Device KIT 1   Continuous Glucose Sensor (DEXCOM G7 SENSOR) MISC CHANGE EVERY 10 DAYS   Dapagliflozin  Pro-metFORMIN  ER (XIGDUO  XR) 10-998 MG TB24 1 tablet, Oral, 2 times daily   dicyclomine  (BENTYL ) 20 mg, Oral, 3 times daily before meals & bedtime   ELIQUIS  5 MG TABS tablet TAKE 1 TABLET(5 MG) BY MOUTH TWICE DAILY   fluticasone  (FLONASE ) 50 MCG/ACT nasal spray 2 sprays, Each Nare, Daily PRN   Glucagon  (BAQSIMI  ONE PACK) 3 MG/DOSE POWD To be sprayed in nostril as needed for severe hypoglycemia   glucose blood (ONETOUCH VERIO) test strip USE AS DIRECTED TO CHECK BLOOD SUGAR 4 TIMES DAILY   hydrochlorothiazide  (MICROZIDE ) 12.5 mg, Oral, Daily   Lancets (ONETOUCH DELICA PLUS LANCET33G) MISC Use to check blood sugar 4 times per day   loratadine  (CLARITIN ) 10 mg, Daily   meclizine (ANTIVERT) 25 mg, As needed   Multiple Vitamins-Minerals (MULTIVITAMIN  WITH MINERALS) tablet 1 tablet, Daily   nitroGLYCERIN  (NITROSTAT ) 0.4 MG SL tablet DISSOLVE ONE TABLET UNDER THE TONGUE EVERY 5 MINUTES AS NEEDED FOR CHEST PAIN.  DO NOT EXCEED A TOTAL OF 3 DOSES IN 15 MINUTES   NOVOLOG  FLEXPEN 100 UNIT/ML FlexPen INJECT 10 TO 12 UNITS SUBCUTANEOUSLY THREE TIMES DAILY BEFORE MEAL(S)   omega-3 acid ethyl esters (LOVAZA) 1 g capsule Take by mouth.   ondansetron  (ZOFRAN -ODT) 4 mg, Oral, Every 8 hours PRN   pantoprazole  (PROTONIX ) 40 MG tablet TAKE 1 TABLET BY MOUTH ONCE DAILY 30  MINUTES  PRIOR  TO  EATING   Polyethyl Glyc-Propyl Glyc PF 0.4-0.3 % SOLN 2 drops, 3 times daily PRN    pregabalin  (LYRICA ) 100 mg, Oral, 3 times daily   Probiotic Product (PROBIOTIC DAILY PO) 1 capsule, Daily   rosuvastatin  (CRESTOR ) 40 mg, Oral, Daily   Semaglutide , 2 MG/DOSE, (OZEMPIC , 2 MG/DOSE,) 8 MG/3ML SOPN INJECT 1 DOSE SUBCUTANEOUSLY ONCE A WEEK AS DIRECTED   silodosin (RAPAFLO) 8 mg, Daily with breakfast   TRESIBA  FLEXTOUCH 100 UNIT/ML FlexTouch Pen INJECT 46 UNITS SUBCUTANEOUSLY ONCE DAILY   triamcinolone cream (KENALOG) 0.1 % 1 Application, As needed       Objective:   Physical Exam BP 136/76   Pulse 66   Temp 97.8 F (36.6 C) (Oral)   Resp 16   Ht 5' 11 (1.803 m)   Wt 220 lb 6 oz (100 kg)   SpO2 96%   BMI 30.74 kg/m  General:   Well developed, NAD, BMI noted. HEENT:  Normocephalic . Face symmetric, atraumatic Lungs:  CTA B Normal respiratory effort, no intercostal retractions, no accessory muscle use. Heart: RRR,  no murmur.  DM foot exam: No edema, good pedal pulses, pinprick examination normal Skin: Not pale. Not jaundice Neurologic:  alert & oriented X3.  Speech normal, gait appropriate for age and unassisted Psych--  Cognition and judgment appear intact.  Cooperative with normal attention span and concentration.  Behavior appropriate. No anxious or depressed appearing.      Assessment   Assessment DM -- sees endo Neuropathy due to DM: NCV 2016: + Peripheral neuropathy HTN Hyperlipidemia CV: - Atrial fibrillation, 2011, s/p ablation 02-2014, now sinus rhythm, anticoagulated -High calcium  coronary score, cardiac catheterization: Nonobstructive disease BPH s/p TUNA ED S/P shingles, postherpetic neuralgia R foot TIA (?) 10-2016, saw neuro, Rx carotid us : L ICA 1-39% otherwise (-).  Carotid US  negative (01-2020)  PLAN DM with neuropathy, insulin -dependent: Last A1c 6.9.  Per Endo Neuropathy symptoms unchanged, feels like she is cold or freezing, increased by walking barefoot. Paroxysmal A-fib,   CAD.  Saw cards 02/05/2024.  Anticoagulated without  apparent problems HTN: cards d/c irbesartan  as BP was very good and pt has dizzy spells, spells have decreased, amb BPs remain in the 120s-130sPlan: Continue HCTZ. High cholesterol: On Crestor , Lovaza, check FLP  LLQ abdominal pain: Saw GI 12/2023, CT abdomen nonacute, prescribed Bentyl . Preventive care: PNM 20 today, recommend flu and a COVID booster. RTC 4 months CPX    ==== DM: Per Endo HTN: Well-controlled.  Continue amlodipine, losartan, recent BMP okay. Dizziness: Single episode, likely a peripheral issue.  Neuroexam essentially normal.  Pt strongly request ENT referral, will do.  ER if symptoms severe, repetitive or different.  See AVS. Pain, R nuchal area: Likely a MSK issue.  Recommend Tylenol , continue using a heating pad, call if not gradually better. RTC 6 months

## 2024-02-20 ENCOUNTER — Encounter: Payer: Self-pay | Admitting: Internal Medicine

## 2024-02-20 NOTE — Assessment & Plan Note (Signed)
 DM with neuropathy, insulin -dependent: Last A1c 6.9.  Per Endo Neuropathy symptoms unchanged, feels like feet are  cold or freezing, increase sxs  by walking barefoot. Paroxysmal A-fib,   CAD.  Saw cards 02/05/2024.  Anticoagulated without apparent problems HTN: cards d/c irbesartan  as BP was very good and pt has dizzy spells, spells have decreased, amb BPs remain in the 120s-130sPlan: Continue HCTZ. High cholesterol: On Crestor , Lovaza, check FLP LLQ abdominal pain: Saw GI 12/2023, CT abdomen nonacute, prescribed Bentyl . Preventive care: PNM 20 today, recommend flu and a COVID booster. RTC 4 months CPX

## 2024-02-21 ENCOUNTER — Ambulatory Visit: Payer: Self-pay | Admitting: Internal Medicine

## 2024-02-21 ENCOUNTER — Encounter: Payer: Self-pay | Admitting: Endocrinology

## 2024-02-21 ENCOUNTER — Other Ambulatory Visit: Payer: Self-pay | Admitting: Podiatry

## 2024-02-21 DIAGNOSIS — M792 Neuralgia and neuritis, unspecified: Secondary | ICD-10-CM

## 2024-02-21 MED ORDER — PREGABALIN 100 MG PO CAPS: 100.0000 mg | ORAL_CAPSULE | Freq: Three times a day (TID) | ORAL | 3 refills | Status: DC

## 2024-02-21 NOTE — Telephone Encounter (Signed)
 He is currently on kidney protective medication irbesartan  and dapagliflozin  with Xigduo .  There is a protein leakage in the urine which is called microalbumin, this medication will help to bring it down.  At this time it is okay to monitor.  We can recheck after a few months in about 6 months.  Improving diabetes control will also help to bring that protein level down.  Danny Lamisha Roussell, MD [facility] Endocrinology [facility] [address] [place], [address] [place], [place] [address] Phone # [phone]'

## 2024-02-21 NOTE — Progress Notes (Signed)
 Refill of Lyrica sent in.

## 2024-02-23 ENCOUNTER — Other Ambulatory Visit: Payer: Self-pay | Admitting: Podiatry

## 2024-02-25 ENCOUNTER — Other Ambulatory Visit: Payer: Self-pay | Admitting: Cardiology

## 2024-02-26 ENCOUNTER — Other Ambulatory Visit: Payer: Self-pay | Admitting: Podiatry

## 2024-02-26 ENCOUNTER — Telehealth: Payer: Self-pay | Admitting: Lab

## 2024-02-26 DIAGNOSIS — M792 Neuralgia and neuritis, unspecified: Secondary | ICD-10-CM

## 2024-02-26 MED ORDER — PREGABALIN 100 MG PO CAPS: 100.0000 mg | ORAL_CAPSULE | Freq: Three times a day (TID) | ORAL | 0 refills | Status: DC

## 2024-02-26 NOTE — Telephone Encounter (Signed)
 Noted , it is okay with dapagliflozin /Xigduo .  Will continue to monitor urine microalbumin levels.

## 2024-02-26 NOTE — Telephone Encounter (Signed)
 Patient states no prescription sent to [facility] [address] for his Lyrica .

## 2024-02-26 NOTE — Progress Notes (Signed)
 Medication sent in.

## 2024-02-27 ENCOUNTER — Telehealth: Payer: Self-pay | Admitting: Cardiology

## 2024-02-27 ENCOUNTER — Other Ambulatory Visit: Payer: Self-pay

## 2024-02-27 MED ORDER — NOVOLOG FLEXPEN 100 UNIT/ML ~~LOC~~ SOPN: 20.0000 [IU] | PEN_INJECTOR | Freq: Three times a day (TID) | SUBCUTANEOUS | 2 refills | Status: DC

## 2024-02-27 MED ORDER — IRBESARTAN 75 MG PO TABS: 75.0000 mg | ORAL_TABLET | Freq: Every day | ORAL | Status: DC

## 2024-02-27 NOTE — Telephone Encounter (Signed)
 Patient is concerned with his blood pressure. States Dr. Pietro recently took him off of his BP med. Has been having high readings for the past three days, around 160/85. No symptoms. Please advise

## 2024-02-27 NOTE — Telephone Encounter (Signed)
 Spoke with pt, Aware of dr vertie recommendations.

## 2024-02-27 NOTE — Telephone Encounter (Signed)
 From last MD note 02/05/24 hypertension-patient's blood pressure is controlled.  However he is having dizzy spells.  There is some component of dizziness with changing positions but he has also had episodes while sitting down.  Etiology unclear.  Blood pressure is controlled but borderline today.  I will discontinue Avapro  to see if this improves his symptoms.  If he has recurrences despite the above will consider monitor.   Patient reports home BP readings are elevated. No other home BP readings to report right now, at work. No headaches, vision changes.   No dizzy spells since irbesartan  d/c'ed.  Will route to MD to review elevated BP/advise

## 2024-02-27 NOTE — Telephone Encounter (Signed)
 Spoke with patient, RX did go through, however they were out of stock, he stated they said 1-2 days, today is day 2 he will let us  know if he was able to get it or if we need to send to a different pharmacy.  CT

## 2024-02-27 NOTE — Telephone Encounter (Signed)
 Patient called stating that he has been taking 20 units of Novolog  3 times a day vs 10-12 units and he needed a refill. New script has been sent.   Requested Prescriptions   Signed Prescriptions Disp Refills   NOVOLOG  FLEXPEN 100 UNIT/ML FlexPen 60 mL 2    Sig: Inject 20 Units into the skin 3 (three) times daily with meals.    Authorizing Provider: THAPA, IRAQ    Ordering User: CLEOTILDE ROLIN RAMAN

## 2024-03-03 ENCOUNTER — Encounter: Payer: Self-pay | Admitting: Endocrinology

## 2024-03-03 DIAGNOSIS — E1140 Type 2 diabetes mellitus with diabetic neuropathy, unspecified: Secondary | ICD-10-CM

## 2024-03-03 MED ORDER — NOVOLOG FLEXPEN 100 UNIT/ML ~~LOC~~ SOPN: 20.0000 [IU] | PEN_INJECTOR | Freq: Three times a day (TID) | SUBCUTANEOUS | 2 refills | Status: AC

## 2024-03-03 NOTE — Telephone Encounter (Signed)
 Sent prescription of NovoLog .

## 2024-03-07 ENCOUNTER — Other Ambulatory Visit: Payer: Self-pay | Admitting: Cardiology

## 2024-03-07 MED ORDER — IRBESARTAN 75 MG PO TABS: 75.0000 mg | ORAL_TABLET | Freq: Every day | ORAL | 3 refills | Status: AC

## 2024-03-17 ENCOUNTER — Ambulatory Visit: Admitting: Podiatry

## 2024-03-21 ENCOUNTER — Encounter: Payer: Self-pay | Admitting: Cardiology

## 2024-03-25 ENCOUNTER — Encounter: Payer: Self-pay | Admitting: Podiatry

## 2024-03-25 ENCOUNTER — Ambulatory Visit: Admitting: Podiatry

## 2024-03-25 DIAGNOSIS — B351 Tinea unguium: Secondary | ICD-10-CM

## 2024-03-25 DIAGNOSIS — M79674 Pain in right toe(s): Secondary | ICD-10-CM | POA: Diagnosis not present

## 2024-03-25 DIAGNOSIS — M79675 Pain in left toe(s): Secondary | ICD-10-CM | POA: Diagnosis not present

## 2024-03-25 DIAGNOSIS — Z794 Long term (current) use of insulin: Secondary | ICD-10-CM

## 2024-03-25 DIAGNOSIS — L84 Corns and callosities: Secondary | ICD-10-CM | POA: Diagnosis not present

## 2024-03-25 DIAGNOSIS — E0840 Diabetes mellitus due to underlying condition with diabetic neuropathy, unspecified: Secondary | ICD-10-CM | POA: Diagnosis not present

## 2024-03-25 NOTE — Progress Notes (Unsigned)
  Subjective:  Patient ID: Prentice LELON No, male    DOB: [birth year],  MRN: [id number]  Chief Complaint  Patient presents with   [facility]    [facility] with callous A1c 6.9 in July Eliquis     63 y.o. male presents with the above complaint. History confirmed with patient. Patient presenting with pain related to dystrophic thickened elongated nails. Patient is unable to trim own nails related to nail dystrophy. Patient does  have a history of T2DM. Patient does have callus present located at bilateral  fifth and first met heads causing pain.  Patient does have neuropathic pain taking Lyrica  currently, this does help when he takes it.  Objective:  Physical Exam: warm, good capillary refill, diminished pedal hair growth nail exam onychomycosis of the toenails, onycholysis, and dystrophic nails greater than 3 mm thickening DP pulses palpable, PT pulses palpable, and protective sensation intact, decreased vibratory sensation, subjective neuropathy symptoms. Left Foot:  Pain with palpation of nails due to elongation and dystrophic growth.  Hyperkeratotic lesion sub one and fifth metatarsal heads. Right Foot: Pain with palpation of nails due to elongation and dystrophic growth.  Right foot with hyperkeratotic lesion subone and subfifth metatarsal head.  Assessment:   1. Diabetes mellitus due to underlying condition, controlled, with diabetic neuropathy, with long-term current use of insulin  (HCC)   2. Pain due to onychomycosis of toenails of both feet   3. Pre-ulcerative calluses         Plan:  Patient was evaluated and treated and all questions answered.   # Preulcerative calluses bilateral subfifth and subfirst metatarsal heads bilaterally All symptomatic hyperkeratoses x4 were safely debrided with a sterile #312 blade to patient's level of comfort without incident. We discussed preventative and palliative care of these lesions including supportive and accommodative shoegear, padding, prefabricated  and custom molded accommodative orthoses, use of a pumice stone and lotions/creams daily.  #Onychomycosis with pain  -Nails palliatively debrided as below. -Educated on self-care  Procedure: Nail Debridement Rationale: Pain Type of Debridement: manual, sharp debridement. Instrumentation: Nail nipper, rotary burr. Number of Nails: 10   # Diabetes with neuropathy -Patient educated on diabetes. Discussed proper diabetic foot care and discussed risks and complications of disease. Educated patient in depth on reasons to return to the office immediately should he/she discover anything concerning or new on the feet. All questions answered. Discussed proper shoes as well.  - Continue Lyrica  100 mg 3 times daily  Return in about 3 months (around 06/24/2024) for Diabetic Foot Care.         Ethan Saddler, DPM [facility] / [facility]

## 2024-03-31 ENCOUNTER — Ambulatory Visit: Admitting: Emergency Medicine

## 2024-04-02 ENCOUNTER — Encounter: Payer: Self-pay | Admitting: Emergency Medicine

## 2024-04-02 ENCOUNTER — Ambulatory Visit: Attending: Emergency Medicine | Admitting: Emergency Medicine

## 2024-04-02 VITALS — BP 124/69 | HR 72 | Ht 71.0 in | Wt 226.0 lb

## 2024-04-02 DIAGNOSIS — I2510 Atherosclerotic heart disease of native coronary artery without angina pectoris: Secondary | ICD-10-CM

## 2024-04-02 DIAGNOSIS — R42 Dizziness and giddiness: Secondary | ICD-10-CM

## 2024-04-02 DIAGNOSIS — R600 Localized edema: Secondary | ICD-10-CM

## 2024-04-02 DIAGNOSIS — Z79899 Other long term (current) drug therapy: Secondary | ICD-10-CM

## 2024-04-02 DIAGNOSIS — I10 Essential (primary) hypertension: Secondary | ICD-10-CM

## 2024-04-02 DIAGNOSIS — I480 Paroxysmal atrial fibrillation: Secondary | ICD-10-CM | POA: Diagnosis not present

## 2024-04-02 DIAGNOSIS — E785 Hyperlipidemia, unspecified: Secondary | ICD-10-CM | POA: Diagnosis not present

## 2024-04-02 NOTE — Progress Notes (Signed)
 Cardiology Office Note:    Date:  04/02/2024  ID:  Va, Broadwell Dec 20, 1960, MRN [id number] PCP: Amon Aloysius BRAVO, MD  [facility] Providers Cardiologist:  Redell Shallow, MD Cardiology APP:  Rana Lum CROME, NP       Patient Profile:       Chief Complaint: Acute visit for dizziness and leg swelling History of Present Illness:  JVION TURGEON is a 63 y.o. male with visit-pertinent history of hypertension, proximal atrial fibrillation, elevated coronary calcium  score, type 2 diabetes on insulin , hyperlipidemia  Patient previously had atrial fibrillation ablation on 02/2014 by Dr. Kelsie.  Later cardiac monitor in 07/2021 showed no atrial fibrillation, rare PACs and PVCs.  Coronary CTA in 07/2021 showed a coronary calcium  score of 2233 (99th percentile).  The study was sent for FFR which was negative.  Echocardiogram 07/2021 showed LVEF 60 to 65%, no RWMA, normal diastolic parameters, normal RV function.  Carotid ultrasounds in 07/2021 showed no evidence of stenosis in bilateral ICAs.  Patient underwent cardiac catheterization on 08/30/2021 that showed multivessel coronary calcifications with mild luminal irregularities.  There was 50% stenosis in the mid LAD, 20% stenosis to ostial ramus intermediate, 5-10% stenosis in the proximal circumflex, 20% stenosis in the proximal RCA, and 30-40% mid RCA stenosis that was treated medically.  He was seen in clinic on 10/04/2023 by Rollo, [place].  He presented with lower extremity swelling has been ongoing for the past several months.  On exam he had trace BLE.  He was started on hydrochlorothiazide  12.5 mg daily.  He was last seen in clinic on 02/05/2024 with Dr. Shallow.  Patient had noted to have occasional dizzy spells as of recent.  No syncope.  His irbesartan  75 mg daily was discontinued to see if this improves his dizziness.  He called into nurse triage line on 02/27/2024 reporting elevated blood pressures into the 160s.  He was restarted on  irbesartan  75 mg daily.   Discussed the use of AI scribe software for clinical note transcription with the patient, who gave verbal consent to proceed.  History of Present Illness MACKEY VARRICCHIO is a 63 year old male with a history of atrial fibrillation who presents with leg swelling and dizziness.   He experiences ongoing lower extremity swelling now for greater than 6 months.  He does experience bilateral LEE but more left than right.  He denies any calf pain does have peripheral neuropathy which causes some foot pain and tenderness at times.  The swelling worsens with prolonged standing and walking and improves with leg elevation at night. Hydrochlorothiazide  did not seem to provide much relief.  During work he wears heavy work boots, is constantly on his feet all day, and is constantly walking possibly contributing to his symptoms.  He does have some chronic shortness of breath with no acute changes.  Denies any shortness of breath at rest, orthopnea, PND.  Dizziness also continues area occurs when transitioning from sitting or lying down to standing, beginning around the time he started hydrochlorothiazide . Symptoms persist despite prior to discontinuation of irbesartan .  Dizziness also occurs when getting up from the floor or in the shower when closing his eyes.  He denies any syncope, presyncope, or falls.  He denies any chest pains or exertional symptoms.  He denies any symptoms concerning for recurrent atrial fibrillation.  Does wear an Apple Watch daily to monitor for abnormal rhythms.  His diet includes fast food and high salt meals, and he consumes  three to four soft drinks daily.  Review of systems:  Please see the history of present illness. All other systems are reviewed and otherwise negative.      Studies Reviewed:        Cardiac catheterization 08/30/2021   Prox RCA lesion is 20% stenosed.   Prox RCA to Mid RCA lesion is 40% stenosed.   Prox Cx lesion is 5% stenosed.    Mid LAD lesion is 50% stenosed.   Ramus lesion is 20% stenosed.   LV end diastolic pressure is mildly elevated.   The left ventricular ejection fraction is 55-65% by visual estimate.   Multi-vessel coronary calcification and mild luminal irregularities. The LAD has focal 50% mid stenosis. There is mild 20% ostial stenosis of a ramus intermediate vessel. The proximal circumflex has mild stenosis of 5 to 10%. The RCA is a large dominant vessel with mild luminal irregularity with 20% proximal stenosis and 30 to 40% mid stenosis..   Normal LV function with EF estimated 55 to 65% without wall motion abnormalities.  LVEDP 19 mmHg.   RECOMMENDATION: Medical therapy for nonobstructive CAD.  Patient is not on any anti-ischemic therapy.  Consider adding low-dose beta-blocker therapy if heart rate and blood pressure allow.  Patient can resume Eliquis  tomorrow.  If no contraindications with underlying CAD can also add low-dose aspirin  81 mg.  Aggressive lipid-lowering therapy with target LDL in the 60s or below. Diagnostic Dominance: Right   Carotid duplex 08/23/2021 Right Carotid: There is no evidence of stenosis in the right ICA.   Left Carotid: There was no evidence of thrombus, dissection,  atherosclerotic               plaque or stenosis in the cervical carotid system.   Vertebrals:  Bilateral vertebral arteries demonstrate antegrade flow.  Subclavians: Normal flow hemodynamics were seen in bilateral subclavian               arteries.   Echocardiogram 08/18/2021 1. Left ventricular ejection fraction, by estimation, is 60 to 65%. The  left ventricle has normal function. The left ventricle has no regional  wall motion abnormalities. Left ventricular diastolic parameters were  normal.   2. Right ventricular systolic function is normal. The right ventricular  size is normal.   3. The mitral valve is normal in structure. No evidence of mitral valve  regurgitation. No evidence of mitral  stenosis.   4. The aortic valve is normal in structure. Aortic valve regurgitation is  not visualized. No aortic stenosis is present.   5. The inferior vena cava is normal in size with greater than 50%  respiratory variability, suggesting right atrial pressure of 3 mmHg.    Coronary CTA 08/15/2021 1. Severe 3 vessel coronary calcium  score 2233, 99 th percentile for age/sex   2.  CAD RADS 3 CAD see description above study sent for FFR CT   3.  Normal ascending thoracic aorta 3.2 cm  FFR Negative FFR CT Borderline distal LAD 0.77   Cardiac event monitor 08/02/2021 HR 51 - 148 bpm, average 75 bpm. No atrial fibrillation detected. Rare ventricular ectopy. Occasional (2%) supraventricular ectopy. Noise limits some rhythm strip interpretations.  Risk Assessment/Calculations:    CHA2DS2-VASc Score = 4   This indicates a 4.8% annual risk of stroke. The patient's score is based upon: CHF History: 0 HTN History: 1 Diabetes History: 1 Stroke History: 0 Vascular Disease History: 1 Age Score: 1 Gender Score: 0  Physical Exam:   VS:  BP 124/69 (BP Location: Right Arm, Patient Position: Sitting, Cuff Size: Normal)   Pulse 72   Ht 5' 11 (1.803 m)   Wt 226 lb (102.5 kg)   SpO2 95%   BMI 31.52 kg/m    Wt Readings from Last 3 Encounters:  04/02/24 226 lb (102.5 kg)  02/19/24 220 lb 6 oz (100 kg)  02/05/24 225 lb 14.4 oz (102.5 kg)    GEN: Well nourished, well developed in no acute distress NECK: No JVD; No carotid bruits CARDIAC: RRR, no murmurs, rubs, gallops RESPIRATORY:  Clear to auscultation without rales, wheezing or rhonchi  ABDOMEN: Soft, non-tender, non-distended EXTREMITIES: Trace bilateral edema; No acute deformity      Assessment and Plan:  Lower extremity edema Echocardiogram 07/2021 with LVEF 60 to 65%, no RWMA, normal RV, no significant valve abnormalities - Today patient continues to report lower extremity swelling for well over 6 months going on  to 1 year.  This typically occurs throughout the day and improves at nighttime into the morning.  He does spend a significant amount of time on his feet due to his job.  He wears heavy work boots and is constantly walking and standing on job sites.  Hydrochlorothiazide  was of no benefit.  Denies any significant DOE.  No orthopnea, PND.  He does frequently eat fast food and high sodium meals for lunch on his workdays and drinks around 4 soft drinks a day. - On exam lungs CTAB and trace bilateral LEE - This seems to be likely related to venous insufficiency given his current occupation.  Fortunately he does retire in 1 month.  At this time I feel he does not require diuretic therapy or further testing - I have encouraged him to trial different work boots, lower extremity stockings, elevating legs throughout the day, and low-salt diet.  Will reassess him in x 3 months once he has retired and off of his feet - Will order BNP and BMET today  Hypertension Dizziness Blood pressure today is well-controlled 124/69 currently on hydrochlorothiazide  12.5 mg in the a.m. and irbesartan  75 mg in the p.m. Periodically monitors his blood pressure at home which seems to be relatively controlled - Patient reports ongoing dizziness for around 6 months.  This seems to have occurred around the time he started hydrochlorothiazide  which he takes every morning.  Dizziness occurs only when going from sitting to standing.  Symptoms concerning for orthostatic hypotension - Plan to discontinue hydrochlorothiazide  at this time.  I have asked him to drink less soft drinks and increase his water intake - He will begin monitoring his blood pressure more frequently at home - Can consider heart monitor if symptoms continue  Paroxysmal atrial fibrillation S/p ablation in 2015 He has had no known recurrence since ablation - Today he is without symptoms suggestive of recurrent atrial fibrillation.  He wears his Apple Watch daily that  monitors for irregular rhythms.  He has had no alerts from his watch - Continue Eliquis  5 mg twice daily  Coronary artery disease Cardiac catheterization 08/2021 showed multivessel coronary calcification with only mild luminal irregularities - Today patient is stable without chest pains.  He remains active at work without exertional symptoms.  No symptoms to suggest active angina, no indication for further ischemic evaluation at this time - No ASA due to Eliquis  use - Continue rosuvastatin  40 mg daily  Hyperlipidemia LDL 10 on 01/2024 and well-controlled - Continue rosuvastatin  40 mg daily  T2DM  A1c 6.9% on 12/2023      Dispo:  Return in about 3 months (around 07/03/2024).  Signed, Lum LITTIE Louis, NP

## 2024-04-02 NOTE — Patient Instructions (Addendum)
 Medication Instructions:  STOP HYDROCHLOROTHIAZIDE . TAKE MAGNESIUM GLYCINATE OVER-THE-COUNTER AT BEDTIME.    Lab Work: Nutritional therapist AND BNP TO BE DONE TODAY.  Testing/Procedures: NONE  Follow-Up: At [facility], you and your health needs are our priority.  As part of our continuing mission to provide you with exceptional heart care, our providers are all part of one team.  This team includes your primary Cardiologist (physician) and Advanced Practice Providers or APPs (Physician Assistants and Nurse Practitioners) who all work together to provide you with the care you need, when you need it.  Your next appointment:   3 MONTHS  Provider:   MADISON FOUNTAIN ,NP    Other Instructions: LOW SODIUM DIET LITERATURE GIVEN TODAY. PLEASE BUY COMPRESSION STOCKINGS.

## 2024-04-04 ENCOUNTER — Ambulatory Visit: Payer: Self-pay | Admitting: Emergency Medicine

## 2024-04-04 LAB — BASIC METABOLIC PANEL WITH GFR
BUN/Creatinine Ratio: 12 (ref 10–24)
BUN: 12 mg/dL (ref 8–27)
CO2: 21 mmol/L (ref 20–29)
Calcium: 9.3 mg/dL (ref 8.6–10.2)
Chloride: 99 mmol/L (ref 96–106)
Creatinine, Ser: 1.04 mg/dL (ref 0.76–1.27)
Glucose: 148 mg/dL — ABNORMAL HIGH (ref 70–99)
Potassium: 4 mmol/L (ref 3.5–5.2)
Sodium: 139 mmol/L (ref 134–144)
eGFR: 80 mL/min/1.73 (ref >59)

## 2024-04-04 LAB — BRAIN NATRIURETIC PEPTIDE

## 2024-04-11 ENCOUNTER — Encounter: Payer: BC Managed Care – PPO | Admitting: Internal Medicine

## 2024-04-20 ENCOUNTER — Other Ambulatory Visit: Payer: Self-pay | Admitting: Cardiology

## 2024-04-20 ENCOUNTER — Other Ambulatory Visit: Payer: Self-pay | Admitting: Internal Medicine

## 2024-04-20 DIAGNOSIS — I480 Paroxysmal atrial fibrillation: Secondary | ICD-10-CM

## 2024-04-21 NOTE — Telephone Encounter (Signed)
 Eliquis  5mg  refill request received. Patient is 63 years old, weight-102.5kg, Crea-1.04 on 04/02/24, Diagnosis-Afib, and last seen by Lum Louis on 04/02/24. Dose is appropriate based on dosing criteria. Will send in refill to requested pharmacy.

## 2024-05-27 ENCOUNTER — Encounter: Payer: Self-pay | Admitting: Endocrinology

## 2024-05-27 ENCOUNTER — Ambulatory Visit: Payer: Self-pay | Admitting: Endocrinology

## 2024-05-27 ENCOUNTER — Ambulatory Visit (INDEPENDENT_AMBULATORY_CARE_PROVIDER_SITE_OTHER): Admitting: Endocrinology

## 2024-05-27 VITALS — BP 112/62 | HR 61 | Resp 16 | Ht 71.0 in | Wt 226.2 lb

## 2024-05-27 DIAGNOSIS — Z794 Long term (current) use of insulin: Secondary | ICD-10-CM | POA: Diagnosis not present

## 2024-05-27 DIAGNOSIS — E1140 Type 2 diabetes mellitus with diabetic neuropathy, unspecified: Secondary | ICD-10-CM | POA: Diagnosis not present

## 2024-05-27 DIAGNOSIS — E1165 Type 2 diabetes mellitus with hyperglycemia: Secondary | ICD-10-CM | POA: Diagnosis not present

## 2024-05-27 LAB — POCT GLYCOSYLATED HEMOGLOBIN (HGB A1C): Hemoglobin A1C: 7 % — AB (ref 4.0–5.6)

## 2024-05-27 MED ORDER — TRESIBA FLEXTOUCH 100 UNIT/ML ~~LOC~~ SOPN: PEN_INJECTOR | SUBCUTANEOUS | 4 refills | Status: DC

## 2024-05-27 NOTE — Progress Notes (Signed)
 Outpatient Endocrinology Note Angelli Baruch, MD  05/27/24  Patient's Name: Danny [name]    DOB: Dec 05, 1960    MRN: [id number]                                                    REASON OF VISIT: Follow up for type 2 diabetes mellitus  PCP: Amon Aloysius BRAVO, MD  HISTORY OF PRESENT ILLNESS:   Danny [name] is a 63 y.o. old male with past medical history listed below, is here for follow up of type 2 diabetes mellitus.   Pertinent Diabetes History: _Diagnosed as Diabetes Mellitus type 2  in 1992. Insulin  therapy was started in 2011.   Chronic Diabetes Complications : Retinopathy: no. Last ophthalmology exam was done on annually.  Nephropathy: no, on irbesartan  Peripheral neuropathy: yes, numbness, tingling, followed by neurology, on lyrica . Coronary artery disease: no Stroke: no  Relevant comorbidities and cardiovascular risk factors: Obesity: yes Body mass index is 31.55 kg/m.  Hypertension: yes Hyperlipidemia. Yes, on statin.   Current / Home Diabetic regimen includes: Tresiba  50 units daily.  Ozempic  2 mg weekly.  Xigduo  XR 5 /1000 mg 1 tab twice a day.  Novolog  20 units 2-3 times a day, with meals.  Sometimes he does not take NovoLog  if blood sugar is low or or if he is physically active.  Prior diabetic medications: Metformin , stopped due to fear of liver problems. Januvia, Amaryl, Actos.   Glycemic data:    CONTINUOUS GLUCOSE MONITORING SYSTEM (CGMS) INTERPRETATION: At today's visit, we reviewed CGM downloads. The full report is scanned in the media. Reviewing the CGM trends, blood glucose are as follows:  Dexcom G7 CGM-  Sensor Download (Sensor download was reviewed and summarized below.) Dates: NovoLog  19 to May 27, 2024 for 14 days.  Glucose Management Indicator: 6.9%    Interpretation: Mostly acceptable blood sugar overnight and in between meals.  Occasional mild hyperglycemia with blood sugar in low 200s, no significant hyperglycemia.  No  hypoglycemia.  Hypoglycemia: Patient has no hypoglycemic episodes. Patient has hypoglycemia awareness.  Factors modifying glucose control: 1.  Diabetic diet assessment: 3 meals a day.   2.  Staying active or exercising:   3.  Medication compliance: compliant all of the time.  Interval history  Hemoglobin A1c 7%.  CGM data as reviewed above.  Lately he has noticed somewhat increase in blood sugar.  He has complaints of numbness and tingling of the feet.  Taking Lyrica  and following with neurology.  No vision problem.  No other complaints today.  REVIEW OF SYSTEMS As per history of present illness.   PAST MEDICAL HISTORY: Past Medical History:  Diagnosis Date   Allergic rhinitis    Benign prostatic hypertrophy    s/p TUNA procedure aprx 2006,   Carpal tunnel syndrome 06/17/2009   Diabetes mellitus    Type II dx 1999   DM type 2 with diabetic peripheral neuropathy (HCC) 10/15/2014   ED (erectile dysfunction)    After TUNA   Elevated LFTs 2010   (-) chronic hep  panel   History of cardiovascular stress test 8/11   Negative   Hyperlipidemia    HYPERSOMNIA UNSPECIFIED 12/11/2007   Hypertension    Obesity    PAF (paroxysmal atrial fibrillation) (HCC) 12/21/2009   a. Failed Multaq. b. Prev on flecainide . c. s/p  afib ablation 2015.    PAST SURGICAL HISTORY: Past Surgical History:  Procedure Laterality Date   ABLATION  03/24/2014   PVI by Dr Kelsie   ATRIAL FIBRILLATION ABLATION N/A 03/24/2014   Procedure: ATRIAL FIBRILLATION ABLATION;  Surgeon: Lynwood JONETTA Kelsie, MD;  Location: [facility] CATH LAB;  Service: Cardiovascular;  Laterality: N/A;   COLONOSCOPY  11/25/2012   Mild sigmoid diverticulosis, small internal hemorrhoids, otherwise normal colonoscopy   CYSTOSCOPY  04/26/2008   neg  @ urology, was rx Flomax    LEFT HEART CATH AND CORONARY ANGIOGRAPHY N/A 08/30/2021   Procedure: LEFT HEART CATH AND CORONARY ANGIOGRAPHY;  Surgeon: Burnard Debby LABOR, MD;  Location: [facility] INVASIVE CV LAB;   Service: Cardiovascular;  Laterality: N/A;   PROSTATE SURGERY     TUNA   TEE WITHOUT CARDIOVERSION N/A 03/23/2014   Procedure: TRANSESOPHAGEAL ECHOCARDIOGRAM (TEE);  Surgeon: Vina Okey GAILS, MD;  Location: [facility] ENDOSCOPY;  Service: Cardiovascular;  Laterality: N/A;   VASECTOMY     several years ago    ALLERGIES: No Known Allergies  FAMILY HISTORY:  Family History  Problem Relation Age of Onset   Heart disease Mother    Diabetes Mother    Hypertension Mother    Congestive Heart Failure Mother    Heart attack Mother    Hypertension Father    Colon cancer Neg Hx    Prostate cancer Neg Hx    Stomach cancer Neg Hx    Rectal cancer Neg Hx     SOCIAL HISTORY: Social History   Socioeconomic History   Marital status: Married    Spouse name: Not on file   Number of children: 3   Years of education: GED   Highest education level: GED or equivalent  Occupational History   Occupation: Works for [employer] (state).  Drives a Engineer, Agricultural: [employer]    Comment: works for [employer] (state)  Tobacco Use   Smoking status: Former    Current packs/day: 0.00    Types: Cigarettes    Quit date: 06/26/1985    Years since quitting: 38.9   Smokeless tobacco: Never   Tobacco comments:    Former smoker, quit 1987  Vaping Use   Vaping status: Never Used  Substance and Sexual Activity   Alcohol use: No    Comment:     Drug use: No   Sexual activity: Not on file  Other Topics Concern   Not on file  Social History Narrative   Lives with wife in a one story   Right handed    One story home   3 children         Social Drivers of Health   Financial Resource Strain: Low Risk  (02/18/2024)   Overall Financial Resource Strain (CARDIA)    Difficulty of Paying Living Expenses: Not very hard  Food Insecurity: No Food Insecurity (02/18/2024)   Hunger Vital Sign    Worried About Running Out of Food in the Last Year: Never true    Ran Out of Food in the Last Year: Never true  Transportation  Needs: No Transportation Needs (02/18/2024)   PRAPARE - Administrator, Civil Service (Medical): No    Lack of Transportation (Non-Medical): No  Physical Activity: Inactive (02/18/2024)   Exercise Vital Sign    Days of Exercise per Week: 5 days    Minutes of Exercise per Session: 0 min  Stress: No Stress Concern Present (02/18/2024)   [employer] of Occupational Health -  Occupational Stress Questionnaire    Feeling of Stress: Not at all  Social Connections: Socially Integrated (02/18/2024)   Social Connection and Isolation Panel    Frequency of Communication with Friends and Family: Three times a week    Frequency of Social Gatherings with Friends and Family: Once a week    Attends Religious Services: More than 4 times per year    Active Member of [employer] or Organizations: Yes    Attends Engineer, Structural: More than 4 times per year    Marital Status: Married    MEDICATIONS:  Current Outpatient Medications  Medication Sig Dispense Refill   acetaminophen  (TYLENOL ) 650 MG CR tablet Take 650 mg by mouth every 8 (eight) hours as needed for pain.     apixaban  (ELIQUIS ) 5 MG TABS tablet TAKE 1 TABLET(5 MG) BY MOUTH TWICE DAILY 60 tablet 11   azelastine  (ASTELIN ) 0.1 % nasal spray Place 2 sprays into both nostrils 2 (two) times daily. Use in each nostril as directed 30 mL 5   BD VEO INSULIN  SYRINGE U/F 31G X 15/64 0.5 ML MISC USE 1 SYRINGE AS DIRECTED 3 TO 4 TIMES DAILY 100 each 0   Blood Glucose Monitoring Suppl (ONETOUCH VERIO REFLECT) w/Device KIT 1 1 kit 0   Continuous Glucose Sensor (DEXCOM G7 SENSOR) MISC CHANGE EVERY 10 DAYS 9 each 3   Dapagliflozin  Pro-metFORMIN  ER (XIGDUO  XR) 10-998 MG TB24 Take 1 tablet by mouth twice daily 180 tablet 3   dicyclomine  (BENTYL ) 20 MG tablet Take 1 tablet (20 mg total) by mouth 4 (four) times daily -  before meals and at bedtime. 120 tablet 11   fluticasone  (FLONASE ) 50 MCG/ACT nasal spray Place 2 sprays into both nostrils  daily as needed for allergies or rhinitis. 16 g 5   Glucagon  (BAQSIMI  ONE PACK) 3 MG/DOSE POWD To be sprayed in nostril as needed for severe hypoglycemia 1 each 1   glucose blood (ONETOUCH VERIO) test strip USE AS DIRECTED TO CHECK BLOOD SUGAR 4 TIMES DAILY 125 each 2   irbesartan  (AVAPRO ) 75 MG tablet Take 1 tablet (75 mg total) by mouth daily. 90 tablet 3   Lancets (ONETOUCH DELICA PLUS LANCET33G) MISC Use to check blood sugar 4 times per day 200 each 2   loratadine  (CLARITIN ) 10 MG tablet Take 10 mg by mouth daily.     meclizine (ANTIVERT) 25 MG tablet Take 25 mg by mouth as needed.     Multiple Vitamins-Minerals (MULTIVITAMIN WITH MINERALS) tablet Take 1 tablet by mouth daily.     nitroGLYCERIN  (NITROSTAT ) 0.4 MG SL tablet DISSOLVE ONE TABLET UNDER THE TONGUE EVERY 5 MINUTES AS NEEDED FOR CHEST PAIN.  DO NOT EXCEED A TOTAL OF 3 DOSES IN 15 MINUTES 25 tablet 0   NOVOLOG  FLEXPEN 100 UNIT/ML FlexPen Inject 20 Units into the skin 3 (three) times daily with meals. 60 mL 2   omega-3 acid ethyl esters (LOVAZA) 1 g capsule Take by mouth.     ondansetron  (ZOFRAN -ODT) 4 MG disintegrating tablet Take 1 tablet (4 mg total) by mouth every 8 (eight) hours as needed for nausea or vomiting. 20 tablet 0   pantoprazole  (PROTONIX ) 40 MG tablet TAKE 1 TABLET BY MOUTH ONCE DAILY 30  MINUTES  PRIOR  TO  EATING 90 tablet 0   Polyethyl Glyc-Propyl Glyc PF 0.4-0.3 % SOLN Apply 2 drops to eye 3 (three) times daily as needed (for dry eyes).     pregabalin  (LYRICA ) 100 MG capsule TAKE 1  CAPSULE BY MOUTH THREE TIMES DAILY 90 capsule 0   pregabalin  (LYRICA ) 100 MG capsule Take 1 capsule (100 mg total) by mouth 3 (three) times daily. 270 capsule 0   Probiotic Product (PROBIOTIC DAILY PO) Take 1 capsule by mouth daily.     rosuvastatin  (CRESTOR ) 40 MG tablet Take 1 tablet by mouth once daily 90 tablet 3   Semaglutide , 2 MG/DOSE, (OZEMPIC , 2 MG/DOSE,) 8 MG/3ML SOPN INJECT 1 DOSE SUBCUTANEOUSLY ONCE A WEEK AS DIRECTED 9 mL 3    silodosin (RAPAFLO) 8 MG CAPS capsule Take 8 mg by mouth daily with breakfast.     triamcinolone cream (KENALOG) 0.1 % Apply 1 Application topically as needed.     TRESIBA  FLEXTOUCH 100 UNIT/ML FlexTouch Pen INJECT 54 UNITS SUBCUTANEOUSLY ONCE DAILY 45 mL 4   No current facility-administered medications for this visit.    PHYSICAL EXAM: Vitals:   05/27/24 1005  BP: 112/62  Pulse: 61  Resp: 16  SpO2: 97%  Weight: 226 lb 3.2 oz (102.6 kg)  Height: 5' 11 (1.803 m)     Body mass index is 31.55 kg/m.  Wt Readings from Last 3 Encounters:  05/27/24 226 lb 3.2 oz (102.6 kg)  04/02/24 226 lb (102.5 kg)  02/19/24 220 lb 6 oz (100 kg)    General: Well developed, well nourished male in no apparent distress.  HEENT: AT/[place], no external lesions.  Eyes: Conjunctiva clear and no icterus. Neck: Neck supple  Lungs: Respirations not labored Neurologic: Alert, oriented, normal speech Extremities / Skin: Dry.   Psychiatric: Does not appear depressed or anxious  Diabetic Foot Exam - Simple   No data filed    LABS Reviewed Lab Results  Component Value Date   HGBA1C 7.0 (A) 05/27/2024   HGBA1C 6.9 (A) 01/23/2024   HGBA1C 7.3 (H) 08/06/2023   No results found for: FRUCTOSAMINE Lab Results  Component Value Date   CHOL 59 02/19/2024   HDL 32.70 (L) 02/19/2024   LDLCALC -10 (L) 02/19/2024   LDLDIRECT 62.0 03/29/2021   TRIG 179.0 (H) 02/19/2024   CHOLHDL 2 02/19/2024   Lab Results  Component Value Date   MICRALBCREAT 137.1 (H) 02/19/2024   MICRALBCREAT 0.1 03/25/2010   Lab Results  Component Value Date   CREATININE 1.04 04/02/2024   Lab Results  Component Value Date   GFR 91.83 01/18/2024    ASSESSMENT / PLAN  1. Type 2 diabetes mellitus with diabetic neuropathy, with long-term current use of insulin  (HCC)   2. Uncontrolled type 2 diabetes mellitus with hyperglycemia (HCC)    Diabetes Mellitus type 2, complicated by diabetes neuropathy.  - Diabetic status / severity:  Fair control.  Lab Results  Component Value Date   HGBA1C 7.0 (A) 05/27/2024    - Hemoglobin A1c goal : <6.5%  Mild postprandial hyperglycemia, discussed about limiting carbohydrate and portion control.  - Medications: see below, no change Increase Tresiba  50 to 54 units daily.  Continue Ozempic  2 mg weekly.  Xigduo  XR 10/998 1 tab twice a day.  Continue Novolog  15-20 units 2-3 times a day, with meals.  Okay to adjust based on meal, blood sugar and increase physical activity/work.  - Home glucose testing: CGM and check as needed.  Dexcom G7.  - Discussed/ Gave Hypoglycemia treatment plan.  # Consult : not required at this time.   # Annual urine for microalbuminuria/ creatinine ratio, + microalbuminuria currently, continue ACE/ARB / irbesartan  /on Xigduo .  Last  Lab Results  Component Value Date  MICRALBCREAT 137.1 (H) 02/19/2024    # Foot check nightly / neuropathy, continue lyrica .  Monitored by neurology.  # Annual dilated diabetic eye exams.   - Diet: Eat reasonable portion sizes to promote a healthy weight - Life style / activity / exercise: discussed.   2. Blood pressure  -  BP Readings from Last 1 Encounters:  05/27/24 112/62    - Control is in target.  - No change in current plans.  3. Lipid status / Hyperlipidemia - Last  Lab Results  Component Value Date   LDLCALC -10 (L) 02/19/2024   - Continue rosuvastatin  40mg  daily. Managed by cardiology.    Diagnoses and all orders for this visit:  Type 2 diabetes mellitus with diabetic neuropathy, with long-term current use of insulin  (HCC) -     POCT glycosylated hemoglobin (Hb A1C)  Uncontrolled type 2 diabetes mellitus with hyperglycemia (HCC) -     TRESIBA  FLEXTOUCH 100 UNIT/ML FlexTouch Pen; INJECT 54 UNITS SUBCUTANEOUSLY ONCE DAILY    DISPOSITION Follow up in clinic in 4 months suggested.   All questions answered and patient verbalized understanding of the plan.  Roxan Yamamoto, MD [facility]  Endocrinology [facility] [address] [place], [address] [place], [place] [address] Phone # [phone]  At least part of this note was generated using voice recognition software. Inadvertent word errors may have occurred, which were not recognized during the proofreading process.

## 2024-05-28 ENCOUNTER — Encounter: Payer: Self-pay | Admitting: Gastroenterology

## 2024-05-28 ENCOUNTER — Telehealth: Payer: Self-pay

## 2024-05-28 ENCOUNTER — Ambulatory Visit: Admitting: Gastroenterology

## 2024-05-28 VITALS — BP 108/58 | HR 64 | Ht 71.0 in | Wt 228.1 lb

## 2024-05-28 DIAGNOSIS — R1319 Other dysphagia: Secondary | ICD-10-CM | POA: Diagnosis not present

## 2024-05-28 DIAGNOSIS — R142 Eructation: Secondary | ICD-10-CM | POA: Diagnosis not present

## 2024-05-28 DIAGNOSIS — K219 Gastro-esophageal reflux disease without esophagitis: Secondary | ICD-10-CM | POA: Diagnosis not present

## 2024-05-28 MED ORDER — PANTOPRAZOLE SODIUM 40 MG PO TBEC: 40.0000 mg | DELAYED_RELEASE_TABLET | Freq: Two times a day (BID) | ORAL | 1 refills | Status: DC

## 2024-05-28 NOTE — Telephone Encounter (Signed)
   Patient Name: Danny [name]  DOB: Nov 22, 1960 MRN: [id number]  Primary Cardiologist: Redell Shallow, MD  Chart reviewed as part of pre-operative protocol coverage. Per Dr. Shallow, Hold apixaban  2 days prior to procedure and resume the day after Redell Shallow.  I will route this recommendation to the requesting party via Epic fax function and remove from pre-op pool.  Please call with questions.  [name], NP 05/28/2024, 12:22 PM

## 2024-05-28 NOTE — Progress Notes (Signed)
 Chief Complaint:follow-up abd pain, IBS Primary GI Doctor: Dr. Charlanne   HPI: Danny [name] is a 63 y.o. male  A Fib on Eliquis , HTN, DM2, BPH, HLD, obesity, IBS-D  Interval History Patient last seen in GI office on 01/18/24 by Dr. Charlanne for IBS-D and lower abd pain.  Patient presents for evaluation of esophageal dysphagia with solids. Accompanied by his wife. He reports it started about a year ago, but has increased in severity and frequency.  Denies GERD symptoms. He is currently not on any medications. He does burp a lot. He does drink carbonated drinks.   He is currently using dicyclomine  for IBS which helps.   No alcohol use.   He retired in November, reports his wife is keeping him busy with projects.  Previous GI work-up: Colon 12/2022 - Minimal sigmoid diverticulosis. - Non- bleeding internal hemorrhoids. - The examined portion of the ileum was normal. - The examination was otherwise normal on direct and retroflexion views. - Rpt 10 yrs   EGD 05/2022 - Benign- appearing esophageal stenosis. Dilated. - Z- line irregular, 40 cm from the incisors. Biopsied. - Gastritis. Biopsied. - Normal examined duodenum. - Biopsies were taken with a cold forceps for evaluation of eosinophilic esophagitis.  EGD August 2022 for epigastric pain at [facility]  -Mild gastritis, irregular Z-line. -S/p biopsies distal esophagus and gastric.  We do not have biopsy results. -Report sent for scanning.   Colonoscopy 08/07/2017 at [place]. (Indications-history of polyps, left lower quadrant abdominal pain, diarrhea) -Second-degree hemorrhoids -Otherwise normal colonoscopy -Negative random colon biopsies for microscopic colitis. -Repeat in 5 years -Report sent for scanning.   Neg celiac 2023  Wt Readings from Last 3 Encounters:  05/28/24 228 lb 2 oz (103.5 kg)  05/27/24 226 lb 3.2 oz (102.6 kg)  04/02/24 226 lb (102.5 kg)    Past Medical History:  Diagnosis Date   Allergic rhinitis     Benign prostatic hypertrophy    s/p TUNA procedure aprx 2006,   Carpal tunnel syndrome 06/17/2009   Diabetes mellitus    Type II dx 1999   DM type 2 with diabetic peripheral neuropathy (HCC) 10/15/2014   ED (erectile dysfunction)    After TUNA   Elevated LFTs 2010   (-) chronic hep  panel   History of cardiovascular stress test 8/11   Negative   Hyperlipidemia    HYPERSOMNIA UNSPECIFIED 12/11/2007   Hypertension    Obesity    PAF (paroxysmal atrial fibrillation) (HCC) 12/21/2009   a. Failed Multaq. b. Prev on flecainide . c. s/p afib ablation 2015.    Past Surgical History:  Procedure Laterality Date   ABLATION  03/24/2014   PVI by Dr Kelsie   ATRIAL FIBRILLATION ABLATION N/A 03/24/2014   Procedure: ATRIAL FIBRILLATION ABLATION;  Surgeon: Lynwood JONETTA Kelsie, MD;  Location: [facility] CATH LAB;  Service: Cardiovascular;  Laterality: N/A;   COLONOSCOPY  11/25/2012   Mild sigmoid diverticulosis, small internal hemorrhoids, otherwise normal colonoscopy   CYSTOSCOPY  04/26/2008   neg  @ urology, was rx Flomax    LEFT HEART CATH AND CORONARY ANGIOGRAPHY N/A 08/30/2021   Procedure: LEFT HEART CATH AND CORONARY ANGIOGRAPHY;  Surgeon: Burnard Debby LABOR, MD;  Location: [facility] INVASIVE CV LAB;  Service: Cardiovascular;  Laterality: N/A;   PROSTATE SURGERY     TUNA   TEE WITHOUT CARDIOVERSION N/A 03/23/2014   Procedure: TRANSESOPHAGEAL ECHOCARDIOGRAM (TEE);  Surgeon: Vina Okey GAILS, MD;  Location: [facility] ENDOSCOPY;  Service: Cardiovascular;  Laterality: N/A;  VASECTOMY     several years ago    Current Outpatient Medications  Medication Sig Dispense Refill   acetaminophen  (TYLENOL ) 650 MG CR tablet Take 650 mg by mouth every 8 (eight) hours as needed for pain.     apixaban  (ELIQUIS ) 5 MG TABS tablet TAKE 1 TABLET(5 MG) BY MOUTH TWICE DAILY 60 tablet 11   azelastine  (ASTELIN ) 0.1 % nasal spray Place 2 sprays into both nostrils 2 (two) times daily. Use in each nostril as directed 30 mL 5   BD VEO INSULIN  SYRINGE  U/F 31G X 15/64 0.5 ML MISC USE 1 SYRINGE AS DIRECTED 3 TO 4 TIMES DAILY 100 each 0   Blood Glucose Monitoring Suppl (ONETOUCH VERIO REFLECT) w/Device KIT 1 1 kit 0   Continuous Glucose Sensor (DEXCOM G7 SENSOR) MISC CHANGE EVERY 10 DAYS 9 each 3   Dapagliflozin  Pro-metFORMIN  ER (XIGDUO  XR) 10-998 MG TB24 Take 1 tablet by mouth twice daily 180 tablet 3   dicyclomine  (BENTYL ) 20 MG tablet Take 1 tablet (20 mg total) by mouth 4 (four) times daily -  before meals and at bedtime. 120 tablet 11   fluticasone  (FLONASE ) 50 MCG/ACT nasal spray Place 2 sprays into both nostrils daily as needed for allergies or rhinitis. 16 g 5   Glucagon  (BAQSIMI  ONE PACK) 3 MG/DOSE POWD To be sprayed in nostril as needed for severe hypoglycemia 1 each 1   glucose blood (ONETOUCH VERIO) test strip USE AS DIRECTED TO CHECK BLOOD SUGAR 4 TIMES DAILY 125 each 2   irbesartan  (AVAPRO ) 75 MG tablet Take 1 tablet (75 mg total) by mouth daily. 90 tablet 3   Lancets (ONETOUCH DELICA PLUS LANCET33G) MISC Use to check blood sugar 4 times per day 200 each 2   loratadine  (CLARITIN ) 10 MG tablet Take 10 mg by mouth daily.     meclizine (ANTIVERT) 25 MG tablet Take 25 mg by mouth as needed.     Multiple Vitamins-Minerals (MULTIVITAMIN WITH MINERALS) tablet Take 1 tablet by mouth daily.     nitroGLYCERIN  (NITROSTAT ) 0.4 MG SL tablet DISSOLVE ONE TABLET UNDER THE TONGUE EVERY 5 MINUTES AS NEEDED FOR CHEST PAIN.  DO NOT EXCEED A TOTAL OF 3 DOSES IN 15 MINUTES 25 tablet 0   NOVOLOG  FLEXPEN 100 UNIT/ML FlexPen Inject 20 Units into the skin 3 (three) times daily with meals. 60 mL 2   omega-3 acid ethyl esters (LOVAZA) 1 g capsule Take by mouth.     ondansetron  (ZOFRAN -ODT) 4 MG disintegrating tablet Take 1 tablet (4 mg total) by mouth every 8 (eight) hours as needed for nausea or vomiting. 20 tablet 0   Polyethyl Glyc-Propyl Glyc PF 0.4-0.3 % SOLN Apply 2 drops to eye 3 (three) times daily as needed (for dry eyes).     pregabalin  (LYRICA ) 100  MG capsule TAKE 1 CAPSULE BY MOUTH THREE TIMES DAILY 90 capsule 0   pregabalin  (LYRICA ) 100 MG capsule Take 1 capsule (100 mg total) by mouth 3 (three) times daily. 270 capsule 0   Probiotic Product (PROBIOTIC DAILY PO) Take 1 capsule by mouth daily.     rosuvastatin  (CRESTOR ) 40 MG tablet Take 1 tablet by mouth once daily 90 tablet 3   Semaglutide , 2 MG/DOSE, (OZEMPIC , 2 MG/DOSE,) 8 MG/3ML SOPN INJECT 1 DOSE SUBCUTANEOUSLY ONCE A WEEK AS DIRECTED 9 mL 3   silodosin (RAPAFLO) 8 MG CAPS capsule Take 8 mg by mouth daily with breakfast.     TRESIBA  FLEXTOUCH 100 UNIT/ML FlexTouch Pen INJECT 54  UNITS SUBCUTANEOUSLY ONCE DAILY 45 mL 4   triamcinolone cream (KENALOG) 0.1 % Apply 1 Application topically as needed.     pantoprazole  (PROTONIX ) 40 MG tablet Take 1 tablet (40 mg total) by mouth 2 (two) times daily. 60 tablet 1   No current facility-administered medications for this visit.    Allergies as of 05/28/2024   (No Known Allergies)    Family History  Problem Relation Age of Onset   Heart disease Mother    Diabetes Mother    Hypertension Mother    Congestive Heart Failure Mother    Heart attack Mother    Hypertension Father    Colon cancer Neg Hx    Prostate cancer Neg Hx    Stomach cancer Neg Hx    Rectal cancer Neg Hx     Review of Systems:    Constitutional: No weight loss, fever, chills, weakness or fatigue HEENT: Eyes: No change in vision               Ears, Nose, Throat:  No change in hearing or congestion Skin: No rash or itching Cardiovascular: No chest pain, chest pressure or palpitations   Respiratory: No SOB or cough Gastrointestinal: See HPI and otherwise negative Genitourinary: No dysuria or change in urinary frequency Neurological: No headache, dizziness or syncope Musculoskeletal: No new muscle or joint pain Hematologic: No bleeding or bruising Psychiatric: No history of depression or anxiety    Physical Exam:  Vital signs: BP (!) 108/58 (BP Location: Left  Arm, Patient Position: Sitting, Cuff Size: Normal)   Pulse 64   Ht 5' 11 (1.803 m)   Wt 228 lb 2 oz (103.5 kg)   BMI 31.82 kg/m   Constitutional:   Pleasant male appears to be in NAD, Well developed, Well nourished, alert and cooperative Eyes:   PEERL, EOMI. No icterus. Conjunctiva pink. Neck:  Supple Throat: Oral cavity and pharynx without inflammation, swelling or lesion.  Respiratory: Respirations even and unlabored. Lungs clear to auscultation bilaterally.   No wheezes, crackles, or rhonchi.  Cardiovascular: Normal S1, S2. Regular rate and rhythm. No peripheral edema, cyanosis or pallor.  Gastrointestinal:  Soft, nondistended, nontender. No rebound or guarding. Normal bowel sounds. No appreciable masses or hepatomegaly. Rectal:  Not performed.  Msk:  Symmetrical without gross deformities. Without edema, no deformity or joint abnormality.  Neurologic:  Alert and  oriented x4;  grossly normal neurologically.  Skin:   Dry and intact without significant lesions or rashes.  RELEVANT LABS AND IMAGING: CBC    Latest Ref Rng & Units 01/18/2024    2:32 PM 02/22/2023    8:32 AM 10/09/2022    3:20 PM  CBC  WBC 4.0 - 10.5 K/uL 6.9  5.2  6.8   Hemoglobin 13.0 - 17.0 g/dL 84.9  85.0  84.7   Hematocrit 39.0 - 52.0 % 45.9  46.1  45.1   Platelets 150.0 - 400.0 K/uL 250.0  251  243.0      CMP     Latest Ref Rng & Units 04/02/2024    4:01 PM 01/18/2024    2:32 PM 10/16/2023    8:15 AM  CMP  Glucose 70 - 99 mg/dL 851  863  858   BUN 8 - 27 mg/dL 12  13  12    Creatinine 0.76 - 1.27 mg/dL 8.95  9.15  9.08   Sodium 134 - 144 mmol/L 139  139  138   Potassium 3.5 - 5.2 mmol/L 4.0  3.8  4.8   Chloride 96 - 106 mmol/L 99  100  100   CO2 20 - 29 mmol/L 21  30  23    Calcium  8.6 - 10.2 mg/dL 9.3  9.5  9.3   Total Protein 6.0 - 8.3 g/dL  7.1    Total Bilirubin 0.2 - 1.2 mg/dL  0.4    Alkaline Phos 39 - 117 U/L  75    AST 0 - 37 U/L  34    ALT 0 - 53 U/L  68       Lab Results  Component Value  Date   TSH 1.91 01/18/2024  01/18/24 labs show: TSH 1.91, CRP <1.0 , ALT 68, CBC- wnl   07/2021 echo- Left ventricular ejection fraction, by estimation, is 60 to 65%.    01/25/24 CTAP IMPRESSION: No acute findings within the abdomen or pelvis. Hepatic steatosis.   Assessment/Plan: #1. GERD with eso dysphagia, hx of eso stricture dilated EGD 12/23 -increase pantoprazole  40 mg to twice daily -GERD diet, no late meals -Schedule EGD in [facility] with Dr. Charlanne. The risks and benefits of EGD with possible biopsies and esophageal dilation were discussed with the patient who agrees to proceed. - Cardiac Clearance for Eliquis  -Hold semaglutide  1 week prior  -Hold Eliquis   2 days before procedure - will instruct when and how to resume after procedure. Risks and benefits of procedure including bleeding, perforation, infection, missed lesions, medication reactions and possible hospitalization or surgery if complications occur explained. Additional rare but real risk of cardiovascular event such as heart attack or ischemia/infarct of other organs off Eliquis  explained and need to seek urgent help if this occurs. Will communicate by phone or EMR with patient's prescribing provider that to confirm holding Eliquis  is reasonable in this case.    #2 IBS- D. Neg colon with Bx Feb 2019. Neg colon 12/2022. Dicyclomine  helps. #3 Lower abdo pain with bloating and tenderness. Neg CT scan.  -Bentyl  20mg  po TID   #4 Fatty liver, slightly elevated ALT 68. Does not drink alcohol. On semaglutide .  #5 Afib  -patient on eliquis    Thank you for the courtesy of this consult. Please call me with any questions or concerns.   Bowen Kia, FNP-C [facility] 05/28/2024, 2:54 PM  Cc: Amon Aloysius BRAVO, MD

## 2024-05-28 NOTE — Patient Instructions (Signed)
 You have been scheduled for an endoscopy. Please follow written instructions given to you at your visit today.  If you use inhalers (even only as needed), please bring them with you on the day of your procedure.  If you take any of the following medications, they will need to be adjusted prior to your procedure:   DO NOT TAKE 7 DAYS PRIOR TO TEST- Trulicity (dulaglutide) Ozempic , Wegovy  (semaglutide ) Mounjaro, Zepbound (tirzepatide) Bydureon Bcise (exanatide extended release)  DO NOT TAKE 1 DAY PRIOR TO YOUR TEST Rybelsus  (semaglutide ) Adlyxin (lixisenatide) Victoza (liraglutide) Byetta (exanatide) ___________________________________________________________________________  Due to recent changes in healthcare laws, you may see the results of your imaging and laboratory studies on MyChart before your provider has had a chance to review them.  We understand that in some cases there may be results that are confusing or concerning to you. Not all laboratory results come back in the same time frame and the provider may be waiting for multiple results in order to interpret others.  Please give us  48 hours in order for your provider to thoroughly review all the results before contacting the office for clarification of your results.   Thank you for trusting me with your gastrointestinal care. Deanna May, FNP-C

## 2024-05-28 NOTE — Telephone Encounter (Signed)
 [facility] Pre-operative Risk Assessment     Request for surgical clearance:     Endoscopy Procedure  What type of surgery is being performed?     endoscopic  When is this surgery scheduled?     07/02/24  What type of clearance is required ?   Pharmacy  Are there any medications that need to be held prior to surgery and how long? Eliquis , 2 days  Practice name and name of physician performing surgery?      [facility]  What is your office phone and fax number?      Phone- [phone]  Fax- [phone]  Anesthesia type (None, local, MAC, general) ?       MAC   Please route your response to Karna Louder, CMA

## 2024-05-30 ENCOUNTER — Telehealth: Payer: Self-pay

## 2024-05-30 NOTE — Telephone Encounter (Signed)
 See telephone encounter.

## 2024-06-02 ENCOUNTER — Ambulatory Visit: Admitting: Podiatry

## 2024-06-09 ENCOUNTER — Other Ambulatory Visit: Payer: Self-pay | Admitting: Endocrinology

## 2024-06-09 DIAGNOSIS — E1165 Type 2 diabetes mellitus with hyperglycemia: Secondary | ICD-10-CM

## 2024-06-14 ENCOUNTER — Other Ambulatory Visit: Payer: Self-pay | Admitting: Gastroenterology

## 2024-06-14 DIAGNOSIS — K2970 Gastritis, unspecified, without bleeding: Secondary | ICD-10-CM

## 2024-06-14 DIAGNOSIS — R1310 Dysphagia, unspecified: Secondary | ICD-10-CM

## 2024-06-16 ENCOUNTER — Encounter: Payer: Self-pay | Admitting: Internal Medicine

## 2024-06-16 ENCOUNTER — Ambulatory Visit (INDEPENDENT_AMBULATORY_CARE_PROVIDER_SITE_OTHER): Admitting: Internal Medicine

## 2024-06-16 VITALS — BP 138/66 | HR 67 | Temp 98.0°F | Resp 18 | Ht 71.0 in | Wt 231.1 lb

## 2024-06-16 DIAGNOSIS — I480 Paroxysmal atrial fibrillation: Secondary | ICD-10-CM

## 2024-06-16 DIAGNOSIS — Z794 Long term (current) use of insulin: Secondary | ICD-10-CM | POA: Diagnosis not present

## 2024-06-16 DIAGNOSIS — Z0000 Encounter for general adult medical examination without abnormal findings: Secondary | ICD-10-CM

## 2024-06-16 DIAGNOSIS — E118 Type 2 diabetes mellitus with unspecified complications: Secondary | ICD-10-CM

## 2024-06-16 NOTE — Patient Instructions (Signed)
 GO TO THE LAB :  Get the blood work    Then, go to the front desk for the checkout Please make an appointment for a checkup in 6 months   Proceed with COVID-vaccine at your pharmacy     YOUR PLAN: CHRONIC VENOUS INSUFFICIENCY WITH LOWER EXTREMITY EDEMA: You have leg swelling that gets worse with standing and improves with rest. -Avoid excessive salt intake. -Wear compression stockings, especially in the morning. -Rest and elevate your legs when possible.  ATRIAL FIBRILLATION: Your atrial fibrillation is being managed with Eliquis , and you have not had any recent symptoms. -Continue taking Eliquis  as prescribed.  TYPE 2 DIABETES MELLITUS: Your diabetes is being managed with insulin , Ozempic , and other medications. Your recent A1c level was 7.0. -Continue your current diabetes management regimen.  HYPERTENSION: Your blood pressure is well-controlled with Avapro . -Continue taking Avapro  as prescribed.  HYPERLIPIDEMIA: Your high cholesterol is being managed with rosuvastatin  and Lovaza. -Continue your current lipid-lowering therapy.  DYSPHAGIA: You have recurrent swallowing difficulties and are awaiting an endoscopy after Christmas. -Proceed with the planned endoscopy after Christmas.  GENERAL HEALTH MAINTENANCE: You are up to date on flu and pneumonia vaccinations. We discussed the COVID vaccine, and you tolerated prior vaccinations well. -Get the COVID vaccine at the pharmacy. -Schedule a follow-up appointment in six months.

## 2024-06-16 NOTE — Progress Notes (Unsigned)
 "  Subjective:    Patient ID: Danny [name] No, male    DOB: 12/15/1960, 63 y.o.   MRN: [id number]  DOS:  06/16/2024 CPX Discussed the use of AI scribe software for clinical note transcription with the patient, who gave verbal consent to proceed.  History of Present Illness JULION GATT is a 63 year old male who presents for an annual physical exam.  Dysphagia - Recurrent swallowing difficulty following prior esophageal dilation - Food impaction in the throat - Awaiting endoscopy after Christmas - Under care of a gastroenterologist  Atrial fibrillation and anticoagulation - Takes Eliquis  for atrial fibrillation - No recent chest pain, dyspnea, or palpitations - Monitors blood pressure at home with well-controlled readings  Diabetes mellitus, most recent hemoglobin A1c was 7.0  Hyperlipidemia - Takes rosuvastatin  and Lovaza for lipid management  Peripheral edema - Leg swelling worsens with prolonged standing - Improves with sitting  Urologic symptoms - Follows with a urologist - Urology visit two weeks ago with no new issues - No hematuria or blood in stool    Review of Systems  Other than above, a 14 point review of systems is negative    Past Medical History:  Diagnosis Date   Allergic rhinitis    Benign prostatic hypertrophy    s/p TUNA procedure aprx 2006,   Carpal tunnel syndrome 06/17/2009   Diabetes mellitus    Type II dx 1999   DM type 2 with diabetic peripheral neuropathy (HCC) 10/15/2014   ED (erectile dysfunction)    After TUNA   Elevated LFTs 2010   (-) chronic hep  panel   History of cardiovascular stress test 8/11   Negative   Hyperlipidemia    HYPERSOMNIA UNSPECIFIED 12/11/2007   Hypertension    Obesity    PAF (paroxysmal atrial fibrillation) (HCC) 12/21/2009   a. Failed Multaq. b. Prev on flecainide . c. s/p afib ablation 2015.    Past Surgical History:  Procedure Laterality Date   ABLATION  03/24/2014   PVI by Dr Kelsie   ATRIAL  FIBRILLATION ABLATION N/A 03/24/2014   Procedure: ATRIAL FIBRILLATION ABLATION;  Surgeon: Lynwood JONETTA Kelsie, MD;  Location: [facility] CATH LAB;  Service: Cardiovascular;  Laterality: N/A;   COLONOSCOPY  11/25/2012   Mild sigmoid diverticulosis, small internal hemorrhoids, otherwise normal colonoscopy   CYSTOSCOPY  04/26/2008   neg  @ urology, was rx Flomax    LEFT HEART CATH AND CORONARY ANGIOGRAPHY N/A 08/30/2021   Procedure: LEFT HEART CATH AND CORONARY ANGIOGRAPHY;  Surgeon: Burnard Debby LABOR, MD;  Location: [facility] INVASIVE CV LAB;  Service: Cardiovascular;  Laterality: N/A;   PROSTATE SURGERY     TUNA   TEE WITHOUT CARDIOVERSION N/A 03/23/2014   Procedure: TRANSESOPHAGEAL ECHOCARDIOGRAM (TEE);  Surgeon: Vina Okey GAILS, MD;  Location: [facility] ENDOSCOPY;  Service: Cardiovascular;  Laterality: N/A;   VASECTOMY     several years ago    Current Outpatient Medications  Medication Instructions   acetaminophen  (TYLENOL ) 650 mg, Every 8 hours PRN   apixaban  (ELIQUIS ) 5 MG TABS tablet TAKE 1 TABLET(5 MG) BY MOUTH TWICE DAILY   azelastine  (ASTELIN ) 0.1 % nasal spray 2 sprays, Each Nare, 2 times daily, Use in each nostril as directed   BD VEO INSULIN  SYRINGE U/F 31G X 15/64 0.5 ML MISC USE 1 SYRINGE AS DIRECTED 3 TO 4 TIMES DAILY   Blood Glucose Monitoring Suppl (ONETOUCH VERIO REFLECT) w/Device KIT 1   Continuous Glucose Sensor (DEXCOM G7 SENSOR) MISC CHANGE EVERY 10 DAYS  Dapagliflozin  Pro-metFORMIN  ER (XIGDUO  XR) 10-998 MG TB24 1 tablet, Oral, 2 times daily   dicyclomine  (BENTYL ) 20 mg, Oral, 3 times daily before meals & bedtime   fluticasone  (FLONASE ) 50 MCG/ACT nasal spray 2 sprays, Each Nare, Daily PRN   Glucagon  (BAQSIMI  ONE PACK) 3 MG/DOSE POWD To be sprayed in nostril as needed for severe hypoglycemia   glucose blood (ONETOUCH VERIO) test strip USE AS DIRECTED TO CHECK BLOOD SUGAR 4 TIMES DAILY   irbesartan  (AVAPRO ) 75 mg, Oral, Daily   Lancets (ONETOUCH DELICA PLUS LANCET33G) MISC Use to check blood sugar 4  times per day   loratadine  (CLARITIN ) 10 mg, Daily   meclizine (ANTIVERT) 25 mg, As needed   Multiple Vitamins-Minerals (MULTIVITAMIN WITH MINERALS) tablet 1 tablet, Daily   nitroGLYCERIN  (NITROSTAT ) 0.4 MG SL tablet DISSOLVE ONE TABLET UNDER THE TONGUE EVERY 5 MINUTES AS NEEDED FOR CHEST PAIN.  DO NOT EXCEED A TOTAL OF 3 DOSES IN 15 MINUTES   NovoLOG  FlexPen 20 Units, Subcutaneous, 3 times daily with meals   omega-3 acid ethyl esters (LOVAZA) 1 g capsule Take by mouth.   ondansetron  (ZOFRAN -ODT) 4 mg, Oral, Every 8 hours PRN   pantoprazole  (PROTONIX ) 40 mg, Oral, 2 times daily   Polyethyl Glyc-Propyl Glyc PF 0.4-0.3 % SOLN 2 drops, 3 times daily PRN   pregabalin  (LYRICA ) 100 mg, Oral, 3 times daily   pregabalin  (LYRICA ) 100 mg, Oral, 3 times daily   Probiotic Product (PROBIOTIC DAILY PO) 1 capsule, Daily   rosuvastatin  (CRESTOR ) 40 mg, Oral, Daily   Semaglutide , 2 MG/DOSE, (OZEMPIC , 2 MG/DOSE,) 8 MG/3ML SOPN INJECT 1 DOSE SUBCUTANEOUSLY ONCE A WEEK AS DIRECTED   silodosin (RAPAFLO) 8 mg, Daily with breakfast   TRESIBA  FLEXTOUCH 100 UNIT/ML FlexTouch Pen INJECT 46 UNITS SUBCUTANEOUSLY ONCE DAILY   triamcinolone cream (KENALOG) 0.1 % 1 Application, As needed       Objective:   Physical Exam BP 138/66   Pulse 67   Temp 98 F (36.7 C) (Oral)   Resp 18   Ht 5' 11 (1.803 m)   Wt 231 lb 2 oz (104.8 kg)   SpO2 97%   BMI 32.24 kg/m  General: Well developed, NAD, BMI noted Neck: No  thyromegaly  HEENT:  Normocephalic . Face symmetric, atraumatic Lungs:  CTA B Normal respiratory effort, no intercostal retractions, no accessory muscle use. Heart: RRR,  no murmur.  Abdomen:  Not distended, soft, non-tender. No rebound or rigidity.   Lower extremities: Trace bilateral symmetric pretibial edema Skin: Exposed areas without rash. Not pale. Not jaundice Neurologic:  alert & oriented X3.  Speech normal, gait appropriate for age and unassisted Strength symmetric and appropriate for  age.  Psych: Cognition and judgment appear intact.  Cooperative with normal attention span and concentration.  Behavior appropriate. No anxious or depressed appearing.     Assessment   Assessment DM -- sees endo Neuropathy due to DM: NCV 2016: + Peripheral neuropathy HTN Hyperlipidemia CV: - Atrial fibrillation, 2011, s/p ablation 02-2014, now sinus rhythm, anticoagulated -High calcium  coronary score, cardiac catheterization: Nonobstructive disease BPH s/p TUNA ED S/P shingles, postherpetic neuralgia R foot TIA (?) 10-2016, saw neuro, Rx carotid us : L ICA 1-39% otherwise (-).  Carotid US  negative (01-2020)   Assessment & Plan  Here for CPX -Tdap: 2022 - pnm shot 2015;  prevnar 07-2015.  PNM 20: 01/2024 - s/p Shingrix  x2 -Had a flu shot - Rec:  covid booster  -CCS:  Cscope Dr Charlanne in [place] : 2009  hyperplastic polyps.  Colonoscopy 11-2012 ,  Colonoscopy 08/07/2017, C-scope 12-2022 next per GI -Prostate cancer screening, PSA 0.30 May 2023 per [facility].  Sees urology. -Labs reviewed, will get a CBC  Other issues addressed today: GERD: Saw GI 05/28/2024. Recurrent dysphagia with sensation of food impaction, previous dilation performed, symptoms returned. Plans for a  endoscopy after Christmas. PPIs increased to twice daily, will need cardiac clearance due to taking Eliquis . IBS: Per GI Paroxysmal A-fib, CAD: Saw cardiology 04/02/2024  Edema felt to be possibly from venous insufficiency.  On Eliquis , no evidence of bleeding on clinical grounds, check a CBC. Lower extremity edema: Recently seen by cardiology, they suspect venous insufficiency.  I agree.  Recommend: - Avoid excessive salt intake. - Recommended compression stockings, to be worn early morning. - Encouraged rest and leg elevation. DM with CAD and neuropathy, insulin -dependent Managed with insulin , Ozempic , Xigduo , Lyrica . A1c at 7, per Endo.  HTN: Well-controlled with Avapro , satisfactory blood pressure readings.  No  change Hyperlipidemia Managed with rosuvastatin  and Lovaza, recent cholesterol levels checked in August.  No change Social: Recently retired. RTC 6 months         "

## 2024-06-17 ENCOUNTER — Encounter: Payer: Self-pay | Admitting: Internal Medicine

## 2024-06-17 ENCOUNTER — Ambulatory Visit: Payer: Self-pay | Admitting: Internal Medicine

## 2024-06-17 LAB — CBC WITH DIFFERENTIAL/PLATELET
Basophils Absolute: 0.1 K/uL (ref 0.0–0.1)
Basophils Relative: 1 % (ref 0.0–3.0)
Eosinophils Absolute: 0.3 K/uL (ref 0.0–0.7)
Eosinophils Relative: 3.8 % (ref 0.0–5.0)
HCT: 43.1 % (ref 39.0–52.0)
Hemoglobin: 14.2 g/dL (ref 13.0–17.0)
Lymphocytes Relative: 29.6 % (ref 12.0–46.0)
Lymphs Abs: 2 K/uL (ref 0.7–4.0)
MCHC: 33.1 g/dL (ref 30.0–36.0)
MCV: 81.4 fl (ref 78.0–100.0)
Monocytes Absolute: 0.6 K/uL (ref 0.1–1.0)
Monocytes Relative: 9.7 % (ref 3.0–12.0)
Neutro Abs: 3.7 K/uL (ref 1.4–7.7)
Neutrophils Relative %: 55.9 % (ref 43.0–77.0)
Platelets: 226 K/uL (ref 150.0–400.0)
RBC: 5.29 Mil/uL (ref 4.22–5.81)
RDW: 14.8 % (ref 11.5–15.5)
WBC: 6.6 K/uL (ref 4.0–10.5)

## 2024-06-17 NOTE — Assessment & Plan Note (Signed)
"   Here for CPX -Tdap: 2022 - pnm shot 2015;  prevnar 07-2015.  PNM 20: 01/2024 - s/p Shingrix  x2 -Had a flu shot - Rec:  covid booster  -CCS:  Cscope Dr Charlanne in [place] : 2009 hyperplastic polyps.  Colonoscopy 11-2012 ,  Colonoscopy 08/07/2017, C-scope 12-2022 next per GI -Prostate cancer screening, PSA 0.30 May 2023 per [facility].  Sees urology. -Labs reviewed, will get a CBC    "

## 2024-06-17 NOTE — Assessment & Plan Note (Signed)
"   Here for CPX  Other issues addressed today: GERD: Saw GI 05/28/2024. Recurrent dysphagia with sensation of food impaction, previous dilation performed, symptoms returned. Plans for a  endoscopy after Christmas. PPIs increased to twice daily, will need cardiac clearance due to taking Eliquis . IBS: Per GI Paroxysmal A-fib, CAD: Saw cardiology 04/02/2024  Edema felt to be possibly from venous insufficiency.  On Eliquis , no evidence of bleeding on clinical grounds, check a CBC. Lower extremity edema: Recently seen by cardiology, they suspect venous insufficiency.  I agree.  Recommend: - Avoid excessive salt intake. - Recommended compression stockings, to be worn early morning. - Encouraged rest and leg elevation. DM with CAD and neuropathy, insulin -dependent Managed with insulin , Ozempic , Xigduo , Lyrica . A1c at 7, per Endo.  HTN: Well-controlled with Avapro , satisfactory blood pressure readings.  No change Hyperlipidemia Managed with rosuvastatin  and Lovaza, recent cholesterol levels checked in August.  No change Social: Recently retired. RTC 6 months "

## 2024-06-24 ENCOUNTER — Encounter: Payer: Self-pay | Admitting: Podiatry

## 2024-06-24 ENCOUNTER — Ambulatory Visit (INDEPENDENT_AMBULATORY_CARE_PROVIDER_SITE_OTHER): Admitting: Podiatry

## 2024-06-24 DIAGNOSIS — B351 Tinea unguium: Secondary | ICD-10-CM

## 2024-06-24 DIAGNOSIS — M792 Neuralgia and neuritis, unspecified: Secondary | ICD-10-CM

## 2024-06-24 DIAGNOSIS — M79675 Pain in left toe(s): Secondary | ICD-10-CM

## 2024-06-24 DIAGNOSIS — L84 Corns and callosities: Secondary | ICD-10-CM | POA: Diagnosis not present

## 2024-06-24 DIAGNOSIS — E1140 Type 2 diabetes mellitus with diabetic neuropathy, unspecified: Secondary | ICD-10-CM

## 2024-06-24 DIAGNOSIS — M79674 Pain in right toe(s): Secondary | ICD-10-CM

## 2024-06-24 MED ORDER — PREGABALIN 100 MG PO CAPS: 100.0000 mg | ORAL_CAPSULE | Freq: Three times a day (TID) | ORAL | 0 refills | Status: AC

## 2024-06-24 NOTE — Progress Notes (Unsigned)
 "  Subjective:  Patient ID: Danny [name] No, male    DOB: [birth year],  MRN: [id number]  Chief Complaint  Patient presents with   [facility]    [facility] with callus, yes to shoes, he is on his last pair of DM inserts.  A1c 7.0 in Nov [employer]    Discussed the use of AI scribe software for clinical note transcription with the patient, who gave verbal consent to proceed.  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  He has constant coldness and tingling in both feet without burning, limited to the feet. Neuropathy impairs his balance and makes ambulation barefoot difficult, including on pool decks, so he uses water shoes. Symptoms do not disturb his sleep.  He takes Lyrica  100 mg three times daily with good relief of neuropathic pain and only mild fatigue when already tired. He requests a refill today.  He has bothersome calluses beneath the first and fifth metatarsal heads bilaterally, he has difficulty managing them at home  He requests a new pair of diabetic shoes as his current pair is over a year old and he understands the fitting process.  He reports prior bilateral ankle fractures without surgery, prior toenail problems with improvement of the left hallux after treatment, and a recent hemoglobin A1c of 7.0, which he hopes to improve further.      Objective:    Physical Exam EXTREMITIES: Right foot dorsalis pedis and posterior tibial pulses palpable, left foot posterior tibial pulse non-palpable, faint dorsalis pedis pulse. Capillary refill intact to the digits. Diminished pedal hair growth to the toes. MUSCULOSKELETAL: Prominent first and fifth metatarsal heads bilaterally with associated preulcerative calluses x 4 total decreased ankle dorsiflexion with knees extended, improves with knees flexed. Fat  pad atrophy noted. Overall rectus foot type. Nail plates k89 yellow, discolored, thickened >36mm, dystrophic, elongated, tender with dorsal palpation. Good muscle strength and plantar flexion, dorsiflexion, eversion, and inversion in both feet. Good foot range of motion in ankles and big toe joints on both feet. NEUROLOGICAL: Light touch and protective sensation decreased to digits.  Decreased protective sensation.  Subjective paresthesias. SKIN: Preulcerative calluses subfifth and subfirst metatarsal heads bilaterally.   No images are attached to the encounter.    Results    Assessment:   1. Neuropathic pain   2. Pre-ulcerative calluses   3. Pain due to onychomycosis of toenails of both feet   4. Controlled type 2 diabetes with neuropathy [facility])      Plan:  Patient was evaluated and treated and all questions answered.  Assessment and Plan Assessment & Plan Diabetic neuropathy of the feet Chronic diabetic neuropathy with persistent symptoms managed on pregabalin . Decreased protective sensation affects balance and ambulation barefoot. No new symptoms or adverse effects. - Refilled pregabalin  100 mg three times daily. - Discussed dose reduction if drowsiness occurs; he is satisfied with current regimen. - Prescription written for diabetic shoes as well, referral to bionic with paperwork dispensed to patient to take to PCP. Would benefit due to diabetic neuropathy and pre ulcerative calluses, decreased pulses left foot. -Patient educated on diabetes. Discussed proper diabetic foot care and discussed risks and complications of disease. Educated patient in depth on reasons to return to the office immediately should he/she discover anything concerning or new on the feet. All questions answered. Discussed proper shoes as well.  -  I certify that this diagnosis represents a distinct and separate diagnosis that requires evaluation and treatment separate from other procedures or  diagnosis    #Onychomycosis with pain  -Nails palliatively debrided as below. -Educated on self-care - At risk footcare due to diabetes with neuropathy  Procedure: Nail Debridement Rationale: Pain Type of Debridement: manual, sharp debridement. Instrumentation: Nail nipper, rotary burr. Number of Nails: 10   Pre-ulcerative calluses of both feet Pre-ulcerative calluses under first and fifth metatarsal heads bilaterally due to fat pad atrophy and foot structure. Symptomatic. - All symptomatic hyperkeratoses x 3 were safely debrided with a sterile #15 blade to patient's level of comfort without incident. We discussed preventative and palliative care of these lesions including supportive and accommodative shoegear, padding, prefabricated and custom molded accommodative orthoses, use of a pumice stone and lotions/creams daily.       Return in about 3 months (around 09/22/2024) for Diabetic Foot Care.    "

## 2024-06-24 NOTE — Patient Instructions (Signed)
 Look for urea 40% cream or ointment and apply to the thickened dry skin / calluses. This can be bought over the counter, at a pharmacy or online such as [employer].

## 2024-07-01 ENCOUNTER — Ambulatory Visit: Admitting: Podiatry

## 2024-07-02 ENCOUNTER — Encounter: Admitting: Gastroenterology

## 2024-07-03 ENCOUNTER — Ambulatory Visit: Admitting: Emergency Medicine

## 2024-07-03 ENCOUNTER — Telehealth: Payer: Self-pay | Admitting: Endocrinology

## 2024-07-03 NOTE — Telephone Encounter (Signed)
 Patient dropped off Diabetic Shoe Form  for completion. Can be faxed back to [phone]

## 2024-07-03 NOTE — Telephone Encounter (Signed)
 Forms retrieved from folder.

## 2024-07-07 ENCOUNTER — Ambulatory Visit: Admitting: Emergency Medicine

## 2024-07-07 ENCOUNTER — Encounter: Payer: Self-pay | Admitting: Emergency Medicine

## 2024-07-07 VITALS — BP 114/60 | HR 90 | Ht 71.0 in | Wt 226.0 lb

## 2024-07-07 DIAGNOSIS — Z794 Long term (current) use of insulin: Secondary | ICD-10-CM | POA: Diagnosis not present

## 2024-07-07 DIAGNOSIS — R600 Localized edema: Secondary | ICD-10-CM

## 2024-07-07 DIAGNOSIS — I2510 Atherosclerotic heart disease of native coronary artery without angina pectoris: Secondary | ICD-10-CM

## 2024-07-07 DIAGNOSIS — I480 Paroxysmal atrial fibrillation: Secondary | ICD-10-CM | POA: Diagnosis not present

## 2024-07-07 DIAGNOSIS — E785 Hyperlipidemia, unspecified: Secondary | ICD-10-CM | POA: Diagnosis not present

## 2024-07-07 DIAGNOSIS — I10 Essential (primary) hypertension: Secondary | ICD-10-CM

## 2024-07-07 DIAGNOSIS — E118 Type 2 diabetes mellitus with unspecified complications: Secondary | ICD-10-CM | POA: Diagnosis not present

## 2024-07-07 MED ORDER — HYDROCHLOROTHIAZIDE 12.5 MG PO CAPS: 12.5000 mg | ORAL_CAPSULE | Freq: Every day | ORAL | Status: AC

## 2024-07-07 NOTE — Progress Notes (Signed)
 " Cardiology Office Note:    Date:  07/07/2024  ID:  Danny [name], DOB 24-Sep-1960, MRN [id number] PCP: Amon Aloysius BRAVO, MD  [facility] Providers Cardiologist:  Redell Shallow, MD Cardiology APP:  Rana Lum CROME, NP       Patient Profile:       Chief Complaint: 39-month follow-up History of Present Illness:  Danny [name] is a 64 y.o. male with visit-pertinent history of hypertension, proximal atrial fibrillation, elevated coronary calcium  score, type 2 diabetes on insulin , hyperlipidemia   Patient previously had atrial fibrillation ablation on 02/2014 by Dr. Kelsie.  Later cardiac monitor in 07/2021 showed no atrial fibrillation, rare PACs and PVCs.  Coronary CTA in 07/2021 showed a coronary calcium  score of 2233 (99th percentile).  The study was sent for FFR which was negative.  Echocardiogram 07/2021 showed LVEF 60 to 65%, no RWMA, normal diastolic parameters, normal RV function.  Carotid ultrasounds in 07/2021 showed no evidence of stenosis in bilateral ICAs.   Patient underwent cardiac catheterization on 08/30/2021 that showed multivessel coronary calcifications with mild luminal irregularities.  There was 50% stenosis in the mid LAD, 20% stenosis to ostial ramus intermediate, 5-10% stenosis in the proximal circumflex, 20% stenosis in the proximal RCA, and 30-40% mid RCA stenosis that was treated medically.   He was seen in clinic on 10/04/2023 by Rollo, [place].  He presented with lower extremity swelling has been ongoing for the past several months.  On exam he had trace BLE.  He was started on hydrochlorothiazide  12.5 mg daily.   Seen in clinic on 02/05/2024 by Dr. Shallow.  Patient had noted to have occasional dizzy spells as of recent.  No syncope.  His irbesartan  75 mg daily was discontinued to see if this improves his dizziness.   He called into nurse triage line on 02/27/2024 reporting elevated blood pressures into the 160s.  He was restarted on irbesartan  75 mg daily.  Last  seen in clinic on 04/02/2024.  He continued to experience lower extremity swelling well over 6 months going on for 1 year.  This is felt to be due to his current occupation and he was retiring in 1 month.  He continued to experience ongoing dizziness for around 6 months, his symptoms are concerning for orthostatic hypotension.  Dizziness seem to have occurred around the time he started hydrochlorothiazide  which he takes every morning.  Hydrochlorothiazide  was discontinued.   Discussed the use of AI scribe software for clinical note transcription with the patient, who gave verbal consent to proceed.  History of Present Illness Danny [name] is a 64 year old male with hypertension and coronary artery disease who presents for 55-month follow-up.  Today he tells me he is doing exceptionally well without acute cardiovascular concerns or complaints.  He was recently restarted on hydrochlorothiazide  12.5 mg daily despite prior discontinuation for dizziness. He does not know who restarted it.  Furthermore he denies any further episodes of dizziness.  He has atrial fibrillation status post ablation over 11 years ago and remains on Eliquis  twice daily. He monitors his rhythm with an Apple Watch and has not seen atrial fibrillation and has no palpitations, chest pain, dyspnea, or other symptoms suggestive of recurrence.  He has coronary artery disease and had a cardiac catheterization in 2023 that showed minimal disease. He takes rosuvastatin  and his cholesterol is well controlled.  He denies any chest pains or exertional symptoms.  He has diabetes with a recent A1c of 7%, and  hopes to lower it. He takes Xigduo .  He reports prior lower extremity swelling that has improved since retirement and less prolonged standing and walking.  He had prior muscle cramps that resolved after starting magnesium glycinate.  He denies orthopnea, PND, syncope, presyncope, lightheadedness, dizziness, palpitations   Review  of systems:  Please see the history of present illness. All other systems are reviewed and otherwise negative.      Studies Reviewed:    EKG Interpretation Date/Time:  Monday July 07 2024 10:03:04 EST Ventricular Rate:  90 PR Interval:  140 QRS Duration:  90 QT Interval:  382 QTC Calculation: 467 R Axis:   32  Text Interpretation: Sinus rhythm with Premature atrial complexes When compared with ECG of 05-Feb-2024 08:05, Premature atrial complexes are now Present T wave inversion now evident in Inferior leads No significant change was found when compared to prior EKGs Confirmed by Rana Dixon [id number] on 07/07/2024 12:58:44 PM    Cardiac catheterization 08/30/2021   Prox RCA lesion is 20% stenosed.   Prox RCA to Mid RCA lesion is 40% stenosed.   Prox Cx lesion is 5% stenosed.   Mid LAD lesion is 50% stenosed.   Ramus lesion is 20% stenosed.   LV end diastolic pressure is mildly elevated.   The left ventricular ejection fraction is 55-65% by visual estimate.   Multi-vessel coronary calcification and mild luminal irregularities. The LAD has focal 50% mid stenosis. There is mild 20% ostial stenosis of a ramus intermediate vessel. The proximal circumflex has mild stenosis of 5 to 10%. The RCA is a large dominant vessel with mild luminal irregularity with 20% proximal stenosis and 30 to 40% mid stenosis..   Normal LV function with EF estimated 55 to 65% without wall motion abnormalities.  LVEDP 19 mmHg.   RECOMMENDATION: Medical therapy for nonobstructive CAD.  Patient is not on any anti-ischemic therapy.  Consider adding low-dose beta-blocker therapy if heart rate and blood pressure allow.  Patient can resume Eliquis  tomorrow.  If no contraindications with underlying CAD can also add low-dose aspirin  81 mg.  Aggressive lipid-lowering therapy with target LDL in the 60s or below. Diagnostic Dominance: Right    Carotid duplex 08/23/2021 Right Carotid: There is no evidence of  stenosis in the right ICA.   Left Carotid: There was no evidence of thrombus, dissection,  atherosclerotic               plaque or stenosis in the cervical carotid system.   Vertebrals:  Bilateral vertebral arteries demonstrate antegrade flow.  Subclavians: Normal flow hemodynamics were seen in bilateral subclavian               arteries.    Echocardiogram 08/18/2021 1. Left ventricular ejection fraction, by estimation, is 60 to 65%. The  left ventricle has normal function. The left ventricle has no regional  wall motion abnormalities. Left ventricular diastolic parameters were  normal.   2. Right ventricular systolic function is normal. The right ventricular  size is normal.   3. The mitral valve is normal in structure. No evidence of mitral valve  regurgitation. No evidence of mitral stenosis.   4. The aortic valve is normal in structure. Aortic valve regurgitation is  not visualized. No aortic stenosis is present.   5. The inferior vena cava is normal in size with greater than 50%  respiratory variability, suggesting right atrial pressure of 3 mmHg.      Coronary CTA 08/15/2021 1. Severe 3 vessel coronary calcium   score 2233, 99 th percentile for age/sex   2.  CAD RADS 3 CAD see description above study sent for FFR CT   3.  Normal ascending thoracic aorta 3.2 cm   FFR Negative FFR CT Borderline distal LAD 0.77    Cardiac event monitor 08/02/2021 HR 51 - 148 bpm, average 75 bpm. No atrial fibrillation detected. Rare ventricular ectopy. Occasional (2%) supraventricular ectopy. Noise limits some rhythm strip interpretations.  Risk Assessment/Calculations:    CHA2DS2-VASc Score = 4   This indicates a 4.8% annual risk of stroke. The patient's score is based upon: CHF History: 0 HTN History: 1 Diabetes History: 1 Stroke History: 0 Vascular Disease History: 1 Age Score: 1 Gender Score: 0             Physical Exam:   VS:  BP 114/60 (BP Location: Left Arm, Patient  Position: Sitting, Cuff Size: Normal)   Pulse 90   Ht 5' 11 (1.803 m)   Wt 226 lb (102.5 kg)   BMI 31.52 kg/m    Wt Readings from Last 3 Encounters:  07/07/24 226 lb (102.5 kg)  06/16/24 231 lb 2 oz (104.8 kg)  05/28/24 228 lb 2 oz (103.5 kg)    GEN: Well nourished, well developed in no acute distress NECK: No JVD; No carotid bruits CARDIAC: RRR, no murmurs, rubs, gallops RESPIRATORY:  Clear to auscultation without rales, wheezing or rhonchi  ABDOMEN: Soft, non-tender, non-distended EXTREMITIES:  No edema; No acute deformity      Assessment and Plan:  Lower extremity edema Echocardiogram 07/2021 with LVEF 60 to 65%, no RWMA, normal RV, no significant valve abnormalities - His lower extremity edema has resolved since his retirement as he used to stand constantly and wear heavy work boots.  Likely venous insufficiency which is now well-controlled.  No orthopnea, dyspnea, PND, weight gain - Encouraged intermittent lower extremity stockings when he plans to be on his feet most of the day and leg elevation  Hypertension Blood pressure today is under excellent control at 114/60 Hydrochlorothiazide  was discontinued on 03/2024 due to dizziness but was subsequently restarted.  He has been without any dizziness over the past several months.  He has increased his fluid intake - Continue hydrochlorothiazide  12.5 mg daily and irbesartan  75 mg daily   Paroxysmal atrial fibrillation S/p ablation in 2015 He has had no known recurrence since ablation - Today he is without symptoms suggestive of recurrent atrial fibrillation.  He has had no atrial fibrillation alerts on his Apple watch - Continue Eliquis  5 mg twice daily   Coronary artery disease Cardiac catheterization 08/2021 showed multivessel coronary calcification with only mild luminal irregularities - Today patient is stable without chest pains.  He continues to remain active without exertional symptoms.  No symptoms to suggest active  angina, no indication for further ischemic evaluation at this time - No ASA due to Eliquis  use - Continue rosuvastatin  40 mg daily   Hyperlipidemia LDL 10 on 01/2024 and well-controlled - Continue rosuvastatin  40 mg daily   T2DM A1c 7% on 12/2023 - Managed on xigduo  10-998 mg twice daily, insulin , and semaglutide       Dispo:  Return in about 6 months (around 01/04/2025).  Signed, Lum LITTIE Louis, NP  "

## 2024-07-07 NOTE — Patient Instructions (Addendum)
 Medication Instructions:  NO CHANGES  Lab Work: NONE TO BE DONE TODAY.  Testing/Procedures: NONE  Follow-Up: At [facility], you and your health needs are our priority.  As part of our continuing mission to provide you with exceptional heart care, our providers are all part of one team.  This team includes your primary Cardiologist (physician) and Advanced Practice Providers or APPs (Physician Assistants and Nurse Practitioners) who all work together to provide you with the care you need, when you need it.  Your next appointment:   6 MONTHS  Provider:   Redell Shallow, MD OR Lum Louis, NP   Other Instructions:

## 2024-07-17 ENCOUNTER — Other Ambulatory Visit: Payer: Self-pay | Admitting: Gastroenterology

## 2024-07-17 DIAGNOSIS — R1319 Other dysphagia: Secondary | ICD-10-CM

## 2024-07-17 DIAGNOSIS — R142 Eructation: Secondary | ICD-10-CM

## 2024-07-17 DIAGNOSIS — K219 Gastro-esophageal reflux disease without esophagitis: Secondary | ICD-10-CM

## 2024-07-21 ENCOUNTER — Encounter: Payer: Self-pay | Admitting: Gastroenterology

## 2024-07-22 ENCOUNTER — Telehealth: Payer: Self-pay | Admitting: Pharmacy Technician

## 2024-07-22 ENCOUNTER — Other Ambulatory Visit (HOSPITAL_COMMUNITY): Payer: Self-pay

## 2024-07-22 NOTE — Telephone Encounter (Signed)
 Pharmacy Patient Advocate Encounter  Received notification from [employer] that Prior Authorization for Ozempic  (2 MG/DOSE) 8MG /3ML pen-injectors  has been APPROVED from 07/22/24 to 07/22/25. Ran test claim, Copay is $0.00. This test claim was processed through [facility]- copay amounts may vary at other pharmacies due to pharmacy/plan contracts, or as the patient moves through the different stages of their insurance plan.   PA #/Case ID/Reference #: [id number]

## 2024-07-22 NOTE — Telephone Encounter (Signed)
 Pharmacy Patient Advocate Encounter   Received notification from [employer] that prior authorization for Ozempic  (2 MG/DOSE) 8MG /3ML pen-injectors  is required/requested.   Insurance verification completed.   The patient is insured through [facility] [employer].   Per test claim: PA required; PA submitted to above mentioned insurance via Latent Key/confirmation #/EOC [id number] Status is pending

## 2024-07-29 ENCOUNTER — Encounter: Admitting: Gastroenterology

## 2024-08-28 ENCOUNTER — Encounter: Admitting: Gastroenterology

## 2024-09-23 ENCOUNTER — Ambulatory Visit: Admitting: Podiatry

## 2024-09-25 ENCOUNTER — Ambulatory Visit: Admitting: Endocrinology

## 2024-09-30 ENCOUNTER — Ambulatory Visit: Admitting: Endocrinology

## 2024-12-15 ENCOUNTER — Ambulatory Visit: Admitting: Internal Medicine
